# Patient Record
Sex: Female | Born: 1960
Health system: Southern US, Community
[De-identification: ages and names within clinical notes are randomized; demographics above are authoritative.]

## PROBLEM LIST (undated history)

## (undated) DIAGNOSIS — M539 Dorsopathy, unspecified: Secondary | ICD-10-CM

## (undated) DIAGNOSIS — R519 Headache, unspecified: Secondary | ICD-10-CM

## (undated) DIAGNOSIS — E559 Vitamin D deficiency, unspecified: Secondary | ICD-10-CM

## (undated) DIAGNOSIS — G8929 Other chronic pain: Secondary | ICD-10-CM

## (undated) DIAGNOSIS — M545 Low back pain, unspecified: Secondary | ICD-10-CM

## (undated) DIAGNOSIS — R51 Headache: Secondary | ICD-10-CM

## (undated) DIAGNOSIS — D509 Iron deficiency anemia, unspecified: Secondary | ICD-10-CM

## (undated) DIAGNOSIS — IMO0001 Reserved for inherently not codable concepts without codable children: Secondary | ICD-10-CM

## (undated) DIAGNOSIS — R42 Dizziness and giddiness: Secondary | ICD-10-CM

## (undated) DIAGNOSIS — E1142 Type 2 diabetes mellitus with diabetic polyneuropathy: Secondary | ICD-10-CM

## (undated) DIAGNOSIS — K219 Gastro-esophageal reflux disease without esophagitis: Secondary | ICD-10-CM

## (undated) DIAGNOSIS — E039 Hypothyroidism, unspecified: Secondary | ICD-10-CM

## (undated) DIAGNOSIS — F419 Anxiety disorder, unspecified: Secondary | ICD-10-CM

## (undated) DIAGNOSIS — G4730 Sleep apnea, unspecified: Secondary | ICD-10-CM

## (undated) DIAGNOSIS — Z972 Presence of dental prosthetic device (complete) (partial): Secondary | ICD-10-CM

## (undated) DIAGNOSIS — E119 Type 2 diabetes mellitus without complications: Secondary | ICD-10-CM

## (undated) DIAGNOSIS — F329 Major depressive disorder, single episode, unspecified: Secondary | ICD-10-CM

## (undated) DIAGNOSIS — G43019 Migraine without aura, intractable, without status migrainosus: Secondary | ICD-10-CM

## (undated) DIAGNOSIS — I10 Essential (primary) hypertension: Secondary | ICD-10-CM

## (undated) DIAGNOSIS — E785 Hyperlipidemia, unspecified: Secondary | ICD-10-CM

## (undated) DIAGNOSIS — I209 Angina pectoris, unspecified: Secondary | ICD-10-CM

## (undated) DIAGNOSIS — F32A Depression, unspecified: Secondary | ICD-10-CM

## (undated) DIAGNOSIS — Z6841 Body Mass Index (BMI) 40.0 and over, adult: Secondary | ICD-10-CM

## (undated) DIAGNOSIS — T753XXA Motion sickness, initial encounter: Secondary | ICD-10-CM

## (undated) DIAGNOSIS — D125 Benign neoplasm of sigmoid colon: Secondary | ICD-10-CM

## (undated) DIAGNOSIS — K5909 Other constipation: Secondary | ICD-10-CM

## (undated) DIAGNOSIS — E042 Nontoxic multinodular goiter: Secondary | ICD-10-CM

## (undated) HISTORY — PX: CHOLECYSTECTOMY: SHX55

## (undated) HISTORY — DX: Essential (primary) hypertension: I10

## (undated) HISTORY — DX: Major depressive disorder, single episode, unspecified: F32.9

## (undated) HISTORY — DX: Hyperlipidemia, unspecified: E78.5

## (undated) HISTORY — PX: ECTOPIC PREGNANCY SURGERY: SHX613

## (undated) HISTORY — DX: Anxiety disorder, unspecified: F41.9

## (undated) HISTORY — DX: Type 2 diabetes mellitus without complications: E11.9

## (undated) HISTORY — PX: ABDOMINAL HYSTERECTOMY: SHX81

## (undated) HISTORY — DX: Depression, unspecified: F32.A

## (undated) HISTORY — DX: Migraine without aura, intractable, without status migrainosus: G43.019

---

## 1993-07-29 HISTORY — PX: OTHER SURGICAL HISTORY: SHX169

## 1994-07-29 HISTORY — PX: ABDOMINAL HYSTERECTOMY: SHX81

## 1995-07-30 HISTORY — PX: BLADDER SUSPENSION: SHX72

## 2004-04-26 ENCOUNTER — Observation Stay (HOSPITAL_COMMUNITY): Admission: RE | Admit: 2004-04-26 | Discharge: 2004-04-27 | Payer: Self-pay | Admitting: Gynecology

## 2004-07-29 ENCOUNTER — Emergency Department: Payer: Self-pay | Admitting: Emergency Medicine

## 2004-08-06 ENCOUNTER — Ambulatory Visit: Payer: Self-pay

## 2004-12-03 ENCOUNTER — Ambulatory Visit: Payer: Self-pay

## 2005-01-07 ENCOUNTER — Ambulatory Visit: Payer: Self-pay

## 2005-03-13 ENCOUNTER — Ambulatory Visit: Payer: Self-pay | Admitting: Family Medicine

## 2005-04-11 ENCOUNTER — Other Ambulatory Visit: Payer: Self-pay

## 2005-04-11 ENCOUNTER — Emergency Department: Payer: Self-pay | Admitting: Emergency Medicine

## 2005-05-10 ENCOUNTER — Emergency Department (HOSPITAL_COMMUNITY): Admission: EM | Admit: 2005-05-10 | Discharge: 2005-05-11 | Payer: Self-pay | Admitting: Emergency Medicine

## 2005-11-28 ENCOUNTER — Emergency Department: Payer: Self-pay | Admitting: Emergency Medicine

## 2005-11-30 ENCOUNTER — Emergency Department: Payer: Self-pay | Admitting: Emergency Medicine

## 2005-12-02 ENCOUNTER — Emergency Department: Payer: Self-pay | Admitting: Internal Medicine

## 2006-01-09 ENCOUNTER — Ambulatory Visit: Payer: Self-pay | Admitting: Family Medicine

## 2007-08-02 ENCOUNTER — Other Ambulatory Visit: Payer: Self-pay

## 2007-08-02 ENCOUNTER — Emergency Department: Payer: Self-pay | Admitting: Emergency Medicine

## 2007-08-07 ENCOUNTER — Emergency Department: Payer: Self-pay | Admitting: Internal Medicine

## 2007-08-13 DIAGNOSIS — F3341 Major depressive disorder, recurrent, in partial remission: Secondary | ICD-10-CM | POA: Insufficient documentation

## 2007-12-24 ENCOUNTER — Emergency Department: Payer: Self-pay | Admitting: Emergency Medicine

## 2008-09-01 DIAGNOSIS — J4520 Mild intermittent asthma, uncomplicated: Secondary | ICD-10-CM | POA: Insufficient documentation

## 2008-10-13 DIAGNOSIS — J309 Allergic rhinitis, unspecified: Secondary | ICD-10-CM | POA: Insufficient documentation

## 2009-11-21 DIAGNOSIS — K5900 Constipation, unspecified: Secondary | ICD-10-CM | POA: Insufficient documentation

## 2009-11-24 ENCOUNTER — Emergency Department: Payer: Self-pay | Admitting: Emergency Medicine

## 2010-11-16 ENCOUNTER — Encounter: Payer: Self-pay | Admitting: Cardiovascular Disease

## 2010-11-16 ENCOUNTER — Ambulatory Visit (INDEPENDENT_AMBULATORY_CARE_PROVIDER_SITE_OTHER): Payer: Self-pay | Admitting: Cardiovascular Disease

## 2010-11-16 ENCOUNTER — Encounter: Payer: Self-pay | Admitting: *Deleted

## 2010-11-16 DIAGNOSIS — R079 Chest pain, unspecified: Secondary | ICD-10-CM

## 2010-11-16 MED ORDER — NITROGLYCERIN 0.4 MG SL SUBL: 0.4000 mg | SUBLINGUAL_TABLET | SUBLINGUAL | Status: DC | PRN

## 2010-11-16 NOTE — Patient Instructions (Addendum)
Call 911 if the chest pain worsens. Your physician recommends that you schedule a follow-up appointment with Dr. Mariah Milling in 2 weeks. You have been scheduled for a stress test Myoview at [facility] in [place]. You were given instructions for this procedure today.

## 2010-11-16 NOTE — Progress Notes (Signed)
Beverly Barrett Date of Birth  05-01-1961 [facility] / [facility] [address]     [address] [place], [place]  [address] [phone]  Fax  [phone]  History of Present Illness: several weeks of cp.  Associated with dyspnea. Worse with ambulation. Better with rest. No dizziness. No syncope.   Chest pressure / heaviness.  Not similar to her GERD pain.  She had run out of her medications recently.  She restarted them yesterday.     She was seen by her medical doctor yesterday and was referred here today.  She had another episode of CP this am and called 911.  Current Outpatient Prescriptions  Medication Sig Dispense Refill  . aspirin 81 MG EC tablet Take 81 mg by mouth daily.        . carvedilol (COREG) 3.125 MG tablet Take 3.125 mg by mouth 2 (two) times daily with a meal.        . lisinopril-hydrochlorothiazide (PRINZIDE,ZESTORETIC) 20-12.5 MG per tablet Take 1 tablet by mouth 2 (two) times daily.        She has been out of her medications until yesterday when she restarted all of the above meds.  No Known Allergies  Past Medical History  Diagnosis Date  . Hyperlipidemia   . Hypertension   . Asthma   . Depression     Past Surgical History  Procedure Date  . Abdominal hysterectomy   . Cesarean section     History  Smoking status  . Never Smoker   Smokeless tobacco  . Never Used    History  Alcohol Use No    Family History  Problem Relation Age of Onset  . Diabetes Sister   . Hypertension Sister   . Cirrhosis Mother   . Cirrhosis Father   . Breast cancer Sister   . Heart attack Brother     Reviw of Systems:  Reviewed in the HPI.  All other systems are negative.  Physical Exam: BP 128/68  Pulse 63  Ht 5\' 2"  (1.575 m)  Wt 232 lb (105.235 kg)  BMI 42.43 kg/m2 The patient is alert and oriented x 3.  The mood and affect are normal.  The skin is warm and dry.  Color is normal.  The HEENT exam reveals that the sclera are  nonicteric.  The mucous membranes are moist.  The carotids are 2+ without bruits.  There is no thyromegaly.  There is no JVD.  The lungs are clear.  The chest wall is non tender.  The heart exam reveals a regular rate with a normal S1 and S2.  There are no murmurs, gallops, or rubs.  The PMI is not displaced.   Abdominal exam reveals good bowel sounds.  There is no guarding or rebound.  There is no hepatosplenomegaly or tenderness.  There are no masses.  Exam of the legs reveal no clubbing, cyanosis, or edema.  The legs are without rashes.  The distal pulses are intact.  Cranial nerves II - XII are intact.  Motor and sensory functions are intact.  The gait is normal.  ECG: NSR.  + U waves but no acute ST or T wave changes. Assessment / Plan:

## 2010-11-16 NOTE — Assessment & Plan Note (Signed)
Beverly Barrett presents with cp that is consistent with angina.  It's possible that these pains are due to hypertension while she was off her medications.  The pain is centered in her chest and is a pressure like sensation.  It radiates out to her arm.  Her ECG is nonacute.  We will schedule her for a 2 day  stress / Lexiscan Myoview at the hospital.  I have prescribed NTG for her.  We will see her back in the clinic in several weeks.

## 2010-11-16 NOTE — Progress Notes (Signed)
Addended by: Lanny Hurst on: 11/16/2010 03:12 PM   Modules accepted: Orders

## 2010-11-28 ENCOUNTER — Ambulatory Visit (HOSPITAL_COMMUNITY): Payer: Medicaid Other | Attending: Cardiovascular Disease | Admitting: Radiology

## 2010-11-28 DIAGNOSIS — R0989 Other specified symptoms and signs involving the circulatory and respiratory systems: Secondary | ICD-10-CM

## 2010-11-28 DIAGNOSIS — R0609 Other forms of dyspnea: Secondary | ICD-10-CM

## 2010-11-28 DIAGNOSIS — R079 Chest pain, unspecified: Secondary | ICD-10-CM | POA: Insufficient documentation

## 2010-11-28 MED ORDER — TECHNETIUM TC 99M TETROFOSMIN IV KIT
33.0000 | PACK | Freq: Once | INTRAVENOUS | Status: AC | PRN
  Administered 2010-11-28: 33 via INTRAVENOUS

## 2010-11-28 NOTE — Progress Notes (Addendum)
[facility] SITE 3 NUCLEAR MED [address] [place] [address] [phone]  Cardiology Nuclear Med Study  Beverly Barrett is a 50 y.o. female [id number] 1961-02-11   Nuclear Med Background Indication for Stress Test:  Evaluation for Ischemia History:  ~10 yrs. ago GXT:OK per patient. Cardiac Risk Factors: Hypertension, Lipids and Obesity  Symptoms:  Chest Pain with Exertion (last episode of chest discomfort was last week), Diaphoresis, DOE, Fatigue, Palpitations, Rapid HR and SOB   Nuclear Pre-Procedure Caffeine/Decaff Intake:  None NPO After: 8:00pm   Lungs:  Clear.  O2 sat 100%.  Patient used Ventolin inhaler prior to exercise, due to asthmatic cough.  IV 0.9% NS with Angio Cath:  20g  IV Site: L Antecubital  IV Started by:  Stanton Kidney, EMT-P  Chest Size (in):  38 Cup Size: D  Height: 5\' 2"  (1.575 m)  Weight:  228 lb (103.42 kg)  BMI:  Body mass index is 41.70 kg/(m^2). Tech Comments:  Carvedilol held > 24 hours, per patient.    Nuclear Med Study 1 or 2 day study: 2 day  Stress Test Type:  Stress  Reading MD: Marca Ancona, MD  Order Authorizing Provider:  Kristeen Miss, MD  Resting Radionuclide: Technetium 75m Tetrofosmin  Resting Radionuclide Dose: 33 mCi   Stress Radionuclide:  Technetium 46m Tetrofosmin  Stress Radionuclide Dose: 33 mCi           Stress Protocol Rest HR: 92 Stress HR: 162  Rest BP: 120/79 Stress BP: 173/78  Exercise Time (min): 5:00 METS: 7.0   Predicted Max HR: 169 bpm % Max HR: 95.86 bpm Rate Pressure Product: 21308   Dose of Adenosine (mg):  n/a Dose of Lexiscan: n/a mg  Dose of Atropine (mg): n/a Dose of Dobutamine: n/a mcg/kg/min (at max HR)  Stress Test Technologist: Duke Salvia, CMA-N  Nuclear Technologist:  Domenic Polite, CNMT     Rest Procedure:  Myocardial perfusion imaging was performed at rest 45 minutes following the intravenous administration of Technetium 49m Tetrofosmin. Rest ECG: No acute  changes, isolated PVC.  Stress Procedure:  The patient exercised for five minutes on the treadmill utilizing the Bruce protocol.  The patient stopped due to fatigue and denied any chest pain.  There were no diagnostic ST-T wave changes.  Technetium 65m Tetrofosmin was injected at peak exercise and myocardial perfusion imaging was performed after a brief delay. Stress ECG: Insignificant upsloping ST segment depression.  QPS Raw Data Images:  Normal; no motion artifact; normal heart/lung ratio. Stress Images:  Normal homogeneous uptake in all areas of the myocardium. Rest Images:  Normal homogeneous uptake in all areas of the myocardium. Subtraction (SDS):  There is no evidence of scar or ischemia. Transient Ischemic Dilatation (Normal <1.22):  1.02 Lung/Heart Ratio (Normal <0.45):  0.32  Quantitative Gated Spect Images QGS EDV:  74 ml QGS ESV:  22 ml QGS cine images:  Normal Wall Motion QGS EF: 70%  Impression Exercise Capacity:  Below average exercise tolerance.  BP Response:  Normal blood pressure response. Clinical Symptoms:  No chest pain. ECG Impression:  Insignificant upsloping ST segment depression. Comparison with Prior Nuclear Study: No previous nuclear study performed  Overall Impression:  Normal stress nuclear study.  Marca Ancona   Please report normal stress nuclear study. Elyn Aquas.

## 2010-11-30 ENCOUNTER — Ambulatory Visit: Payer: Self-pay | Admitting: Cardiovascular Disease

## 2010-12-04 ENCOUNTER — Ambulatory Visit: Payer: Self-pay | Admitting: Family Medicine

## 2010-12-05 ENCOUNTER — Ambulatory Visit (HOSPITAL_COMMUNITY): Payer: Self-pay | Attending: Cardiovascular Disease | Admitting: Radiology

## 2010-12-05 DIAGNOSIS — R0989 Other specified symptoms and signs involving the circulatory and respiratory systems: Secondary | ICD-10-CM

## 2010-12-05 MED ORDER — TECHNETIUM TC 99M TETROFOSMIN IV KIT
33.0000 | PACK | Freq: Once | INTRAVENOUS | Status: AC | PRN
  Administered 2010-12-05: 33 via INTRAVENOUS

## 2010-12-06 NOTE — Progress Notes (Signed)
Copy routed to Dr. Nahser.Falecha Clark ° °

## 2010-12-07 ENCOUNTER — Ambulatory Visit (INDEPENDENT_AMBULATORY_CARE_PROVIDER_SITE_OTHER): Payer: Self-pay | Admitting: Cardiovascular Disease

## 2010-12-07 ENCOUNTER — Telehealth: Payer: Self-pay | Admitting: *Deleted

## 2010-12-07 ENCOUNTER — Encounter: Payer: Self-pay | Admitting: Cardiovascular Disease

## 2010-12-07 DIAGNOSIS — I10 Essential (primary) hypertension: Secondary | ICD-10-CM | POA: Insufficient documentation

## 2010-12-07 DIAGNOSIS — E7849 Other hyperlipidemia: Secondary | ICD-10-CM | POA: Insufficient documentation

## 2010-12-07 DIAGNOSIS — E785 Hyperlipidemia, unspecified: Secondary | ICD-10-CM

## 2010-12-07 DIAGNOSIS — E669 Obesity, unspecified: Secondary | ICD-10-CM

## 2010-12-07 DIAGNOSIS — R079 Chest pain, unspecified: Secondary | ICD-10-CM

## 2010-12-07 NOTE — Patient Instructions (Signed)
You are doing well. No medication changes were made. Please call us if you have new issues, otherwise follow up as needed.

## 2010-12-07 NOTE — Progress Notes (Signed)
   Patient ID: Beverly Barrett, female    DOB: 05/20/1961, 50 y.o.   MRN: [id number]  HPI Comments: Beverly Barrett is a very pleasant woman, patient of Dr. Carlynn Purl, recently seen in our office for chest discomfort who had a stress test & for routine followup.  She had an exercise treadmill with today perfusion study that showed no ischemia. Adequate exercise tolerance with no significant EKG changes concerning for ischemia. Overall she feels well. She has not had significant chest discomfort recently. She wonders if it could be secondary to stress. She has run out of her cholesterol medication and needs to pick up her refill. She's been trying to watch her weight and is happy that she has recently lost 3 pounds.  EKG shows normal sinus rhythm with rate 85 beats per minute, no significant ST or T wave changes      Review of Systems  Constitutional: Negative.   HENT: Negative.   Eyes: Negative.   Respiratory: Negative.   Cardiovascular: Positive for chest pain.  Gastrointestinal: Negative.   Musculoskeletal: Negative.   Skin: Negative.   Neurological: Negative.   Hematological: Negative.   Psychiatric/Behavioral: Negative.   All other systems reviewed and are negative.    BP 120/88  Pulse 85  Ht 5\' 2"  (1.575 m)  Wt 230 lb (104.327 kg)  BMI 42.07 kg/m2   Physical Exam  Nursing note and vitals reviewed. Constitutional: She is oriented to person, place, and time. She appears well-developed and well-nourished.       obese  HENT:  Head: Normocephalic.  Nose: Nose normal.  Mouth/Throat: Oropharynx is clear and moist.  Eyes: Conjunctivae are normal. Pupils are equal, round, and reactive to light.  Neck: Normal range of motion. Neck supple. No JVD present.  Cardiovascular: Normal rate, regular rhythm, S1 normal, S2 normal, normal heart sounds and intact distal pulses.  Exam reveals no gallop and no friction rub.   No murmur heard. Pulmonary/Chest: Effort normal and breath sounds  normal. No respiratory distress. She has no wheezes. She has no rales. She exhibits no tenderness.  Abdominal: Soft. Bowel sounds are normal. She exhibits no distension. There is no tenderness.  Musculoskeletal: Normal range of motion. She exhibits no edema and no tenderness.  Lymphadenopathy:    She has no cervical adenopathy.  Neurological: She is alert and oriented to person, place, and time. Coordination normal.  Skin: Skin is warm and dry. No rash noted. No erythema.  Psychiatric: She has a normal mood and affect. Her behavior is normal. Judgment and thought content normal.         Assessment and Plan

## 2010-12-07 NOTE — Assessment & Plan Note (Signed)
We have encouraged continued exercise, careful diet management in an effort to lose weight. 

## 2010-12-07 NOTE — Assessment & Plan Note (Signed)
We have suggested that she pick up her refill for her cholesterol medication.

## 2010-12-07 NOTE — Telephone Encounter (Signed)
Patient called with echo results. Pt verbalized understanding. Jodette Brylee Mcgreal RN  

## 2010-12-07 NOTE — Assessment & Plan Note (Signed)
Recent episodes of chest pain are likely atypical in nature. She had a normal exercise perfusion stress test. We have suggested that if she continues to have chest pain, she could try over-the-counter proton pump inhibitors. It is certainly possible that her chest pain is secondary to stress. We have suggested stress reduction techniques, increasing her exercise.

## 2010-12-07 NOTE — Assessment & Plan Note (Signed)
,  Blood pressure is well controlled on today's visit. No changes made to the medications.

## 2011-03-27 ENCOUNTER — Emergency Department: Payer: Self-pay | Admitting: *Deleted

## 2011-08-06 ENCOUNTER — Ambulatory Visit: Payer: Self-pay

## 2013-07-14 ENCOUNTER — Ambulatory Visit: Payer: Self-pay

## 2013-08-05 ENCOUNTER — Ambulatory Visit: Payer: Self-pay

## 2013-08-18 ENCOUNTER — Ambulatory Visit: Payer: Self-pay | Admitting: Family Medicine

## 2013-08-18 DIAGNOSIS — E042 Nontoxic multinodular goiter: Secondary | ICD-10-CM | POA: Insufficient documentation

## 2013-08-18 DIAGNOSIS — E039 Hypothyroidism, unspecified: Secondary | ICD-10-CM | POA: Insufficient documentation

## 2013-08-18 DIAGNOSIS — E034 Atrophy of thyroid (acquired): Secondary | ICD-10-CM | POA: Insufficient documentation

## 2013-12-23 ENCOUNTER — Ambulatory Visit: Payer: Self-pay | Admitting: Family Medicine

## 2014-04-22 ENCOUNTER — Ambulatory Visit: Payer: Self-pay | Admitting: Otolaryngology

## 2014-05-26 ENCOUNTER — Ambulatory Visit: Payer: Self-pay | Admitting: Otolaryngology

## 2014-06-02 ENCOUNTER — Ambulatory Visit: Payer: Self-pay | Admitting: Otolaryngology

## 2014-06-14 DIAGNOSIS — E8881 Metabolic syndrome: Secondary | ICD-10-CM | POA: Insufficient documentation

## 2014-06-14 DIAGNOSIS — Z6841 Body Mass Index (BMI) 40.0 and over, adult: Secondary | ICD-10-CM

## 2014-06-14 DIAGNOSIS — E1159 Type 2 diabetes mellitus with other circulatory complications: Secondary | ICD-10-CM | POA: Insufficient documentation

## 2014-06-14 DIAGNOSIS — E668 Other obesity: Secondary | ICD-10-CM | POA: Insufficient documentation

## 2014-06-14 DIAGNOSIS — M1990 Unspecified osteoarthritis, unspecified site: Secondary | ICD-10-CM | POA: Insufficient documentation

## 2014-06-14 DIAGNOSIS — G8929 Other chronic pain: Secondary | ICD-10-CM | POA: Insufficient documentation

## 2014-06-14 DIAGNOSIS — E669 Obesity, unspecified: Secondary | ICD-10-CM | POA: Insufficient documentation

## 2014-06-14 DIAGNOSIS — I10 Essential (primary) hypertension: Secondary | ICD-10-CM

## 2014-06-14 DIAGNOSIS — E1169 Type 2 diabetes mellitus with other specified complication: Secondary | ICD-10-CM

## 2014-06-14 DIAGNOSIS — K219 Gastro-esophageal reflux disease without esophagitis: Secondary | ICD-10-CM | POA: Insufficient documentation

## 2014-06-14 DIAGNOSIS — I152 Hypertension secondary to endocrine disorders: Secondary | ICD-10-CM | POA: Insufficient documentation

## 2014-06-14 DIAGNOSIS — M2550 Pain in unspecified joint: Secondary | ICD-10-CM

## 2015-01-07 ENCOUNTER — Other Ambulatory Visit: Payer: Self-pay | Admitting: Family Medicine

## 2015-01-07 DIAGNOSIS — R0982 Postnasal drip: Principal | ICD-10-CM

## 2015-01-07 DIAGNOSIS — J309 Allergic rhinitis, unspecified: Secondary | ICD-10-CM

## 2015-02-09 ENCOUNTER — Other Ambulatory Visit: Payer: Self-pay | Admitting: Family Medicine

## 2015-02-09 DIAGNOSIS — E785 Hyperlipidemia, unspecified: Secondary | ICD-10-CM

## 2015-02-09 DIAGNOSIS — I10 Essential (primary) hypertension: Secondary | ICD-10-CM

## 2015-02-09 DIAGNOSIS — G8929 Other chronic pain: Secondary | ICD-10-CM

## 2015-02-09 DIAGNOSIS — M2550 Pain in unspecified joint: Secondary | ICD-10-CM

## 2015-03-15 ENCOUNTER — Other Ambulatory Visit: Payer: Self-pay | Admitting: Family Medicine

## 2015-03-15 DIAGNOSIS — M545 Low back pain, unspecified: Secondary | ICD-10-CM

## 2015-03-15 MED ORDER — TRAMADOL HCL 50 MG PO TABS: 50.0000 mg | ORAL_TABLET | Freq: Two times a day (BID) | ORAL | Status: DC | PRN

## 2015-04-14 ENCOUNTER — Ambulatory Visit (INDEPENDENT_AMBULATORY_CARE_PROVIDER_SITE_OTHER): Payer: BLUE CROSS/BLUE SHIELD | Admitting: Family Medicine

## 2015-04-14 ENCOUNTER — Encounter: Payer: Self-pay | Admitting: Family Medicine

## 2015-04-14 VITALS — BP 142/90 | HR 94 | Temp 98.9°F | Resp 16 | Wt 254.4 lb

## 2015-04-14 DIAGNOSIS — F332 Major depressive disorder, recurrent severe without psychotic features: Secondary | ICD-10-CM | POA: Diagnosis not present

## 2015-04-14 DIAGNOSIS — M2550 Pain in unspecified joint: Secondary | ICD-10-CM | POA: Diagnosis not present

## 2015-04-14 DIAGNOSIS — I10 Essential (primary) hypertension: Secondary | ICD-10-CM

## 2015-04-14 DIAGNOSIS — Z113 Encounter for screening for infections with a predominantly sexual mode of transmission: Secondary | ICD-10-CM | POA: Diagnosis not present

## 2015-04-14 DIAGNOSIS — R350 Frequency of micturition: Secondary | ICD-10-CM | POA: Diagnosis not present

## 2015-04-14 DIAGNOSIS — M545 Low back pain, unspecified: Secondary | ICD-10-CM

## 2015-04-14 DIAGNOSIS — J4520 Mild intermittent asthma, uncomplicated: Secondary | ICD-10-CM

## 2015-04-14 DIAGNOSIS — Z1231 Encounter for screening mammogram for malignant neoplasm of breast: Secondary | ICD-10-CM

## 2015-04-14 DIAGNOSIS — Z0000 Encounter for general adult medical examination without abnormal findings: Secondary | ICD-10-CM

## 2015-04-14 DIAGNOSIS — E038 Other specified hypothyroidism: Secondary | ICD-10-CM

## 2015-04-14 DIAGNOSIS — E034 Atrophy of thyroid (acquired): Secondary | ICD-10-CM

## 2015-04-14 DIAGNOSIS — G8929 Other chronic pain: Secondary | ICD-10-CM | POA: Diagnosis not present

## 2015-04-14 DIAGNOSIS — Z1211 Encounter for screening for malignant neoplasm of colon: Secondary | ICD-10-CM | POA: Insufficient documentation

## 2015-04-14 DIAGNOSIS — M549 Dorsalgia, unspecified: Secondary | ICD-10-CM | POA: Insufficient documentation

## 2015-04-14 DIAGNOSIS — IMO0001 Reserved for inherently not codable concepts without codable children: Secondary | ICD-10-CM | POA: Insufficient documentation

## 2015-04-14 LAB — POCT URINALYSIS DIPSTICK
Bilirubin, UA: NEGATIVE
Glucose, UA: NEGATIVE
Ketones, UA: NEGATIVE
Leukocytes, UA: NEGATIVE
Nitrite, UA: NEGATIVE
Spec Grav, UA: 1.025
Urobilinogen, UA: 0.2
pH, UA: 6

## 2015-04-14 MED ORDER — FLUOXETINE HCL 20 MG PO TABS: 20.0000 mg | ORAL_TABLET | Freq: Every day | ORAL | Status: DC

## 2015-04-14 MED ORDER — MONTELUKAST SODIUM 10 MG PO TABS: 10.0000 mg | ORAL_TABLET | Freq: Every day | ORAL | Status: DC

## 2015-04-14 MED ORDER — TRAMADOL HCL 50 MG PO TABS
50.0000 mg | ORAL_TABLET | Freq: Two times a day (BID) | ORAL | Status: DC | PRN
Start: 2015-04-14 — End: 2015-06-16

## 2015-04-14 NOTE — Progress Notes (Signed)
Name: Beverly Barrett   MRN: [id number]    DOB: 1961-04-13   Date:04/14/2015       Progress Note  Subjective  Chief Complaint  Chief Complaint  Patient presents with  . Annual Exam  . Medication Refill    all meds  . Urinary Tract Infection    patient stated that she has been waking up with nausea. she has some frequency and incomplete emptying of her bladder.  . Constipation    HPI  Patient is here today for a Complete Female Physical Exam but also has new symptoms and needs refills of ongoing medical conditions.  The patient complains of nausea without vomiting for several days, backache which is chronic and stable and frequency of urination and re occurance of depression. Overall feels health needs and waxing and waning. Diet is well balanced but consists of high fat, high carb, large portion meals. In general does not exercise regularly. Sees dentist regularly and addresses vision concerns with ophthalmologist if applicable. In regards to sexual activity the patient is not currently sexually active. Currently is not concerned about exposure to any STDs. Menstrual history is positive for hysterectomy, PAP testing done negative in 2015.    Beverly Barrett has a past history of depression but has not needed medication therapy in many years. She will not speak about stressors in life.. She complains of depressed mood, fatigue, feelings of worthlessness/guilt, hopelessness and insomnia. Onset was approximately a few months ago, gradually worsening since that time.  She denies current suicidal and homicidal plan or intent but has felt that she may be better off dead in the past month.   Family history significant for anxiety and depression. Possible organic causes contributing are: endocrine/metabolic.  Risk factors: previous episode of depression Previous treatment includes she is not sure if she has been on Effexor in the past and individual therapy.  In addition she reports symptoms of vague  nausea without headaches, GI upset, change in bowel movements, focal neurological changes, chest pain. She is having urinary frequency without pain or discomfort or pelvic pressure. Chronic constipation issues are stable, not worse, not better. Has had a C-scope many years ago and is willing to return for repeat as she did have polyps on her last scope. She has a diagnosis of HTN and is taking her medication Losartan-HCTZ 100-25mg  as instructed with no side effects, worsening swelling of LE, chest pain, SOB. Asthma is well controled on prn albuterol inhaler use and montelukast and controlling her allergies. Chronic joint pain involving lower back, hips, right greater than left knees stable on tramadol 50 mg twice a day. Needs several refills.   Active Ambulatory Problems    Diagnosis Date Noted  . Chest pain 11/16/2010  . Hyperlipidemia 12/07/2010  . Obesity 12/07/2010  . Allergic rhinitis 10/13/2008  . Asthma, mild intermittent, well-controlled 09/01/2008  . Chronic pain of multiple joints 06/14/2014  . CN (constipation) 11/21/2009  . Clinical depression 08/13/2007  . Dysmetabolic syndrome 62/95/2841  . Acid reflux 06/14/2014  . Hypertension goal BP (blood pressure) < 140/90 12/07/2010  . Familial multiple lipoprotein-type hyperlipidemia 12/07/2010  . Adult hypothyroidism 08/18/2013  . Extreme obesity 06/14/2014  . Arthritis, degenerative 06/14/2014  . Frequency 04/14/2015  . Annual physical exam 04/14/2015  . Encounter for screening for malignant neoplasm of colon 04/14/2015  . Encounter for screening mammogram for malignant neoplasm of breast 04/14/2015  . Encounter for cholesteral screening for cardiovascular disease 04/14/2015  . Screening for STD (sexually transmitted disease) 04/14/2015  .  Midline low back pain without sciatica 04/14/2015   Resolved Ambulatory Problems    Diagnosis Date Noted  . HTN (hypertension) 12/07/2010   Past Medical History  Diagnosis Date  .  Hypertension   . Asthma   . Depression     Past Surgical History  Procedure Laterality Date  . Abdominal hysterectomy    . Cesarean section      Family History  Problem Relation Age of Onset  . Diabetes Sister   . Hypertension Sister   . Cirrhosis Mother   . Cirrhosis Father   . Breast cancer Sister   . Heart attack Brother     Social History   Social History  . Marital Status: Married    Spouse Name: N/A  . Number of Children: N/A  . Years of Education: N/A   Occupational History  . Not on file.   Social History Main Topics  . Smoking status: Never Smoker   . Smokeless tobacco: Never Used  . Alcohol Use: No  . Drug Use: No  . Sexual Activity:    Partners: Male   Other Topics Concern  . Not on file   Social History Narrative     Current outpatient prescriptions:  .  aspirin 81 MG EC tablet, Take 81 mg by mouth daily.  , Disp: , Rfl:  .  Aspirin-Acetaminophen (GOODYS BODY PAIN PO), Take by mouth., Disp: , Rfl:  .  atorvastatin (LIPITOR) 20 MG tablet, TAKE ONE TABLET BY MOUTH AT BEDTIME, Disp: 90 tablet, Rfl: 2 .  Azelastine HCl 0.15 % SOLN, , Disp: , Rfl: 3 .  carvedilol (COREG) 3.125 MG tablet, Take 3.125 mg by mouth 2 (two) times daily with a meal.  , Disp: , Rfl:  .  diclofenac (VOLTAREN) 75 MG EC tablet, TAKE ONE TABLET BY MOUTH EVERY 12 HOURS AS NEEDED, Disp: 60 tablet, Rfl: 3 .  docusate sodium (COLACE) 100 MG capsule, Take 100 mg by mouth 2 (two) times daily., Disp: , Rfl:  .  fluticasone (FLONASE) 50 MCG/ACT nasal spray, by Nasal route., Disp: , Rfl:  .  levothyroxine (SYNTHROID, LEVOTHROID) 100 MCG tablet, , Disp: , Rfl: 1 .  Loratadine 10 MG CAPS, Take by mouth., Disp: , Rfl:  .  losartan-hydrochlorothiazide (HYZAAR) 100-25 MG per tablet, TAKE ONE TABLET BY MOUTH ONCE DAILY, Disp: 30 tablet, Rfl: 3 .  montelukast (SINGULAIR) 10 MG tablet, Take 1 tablet (10 mg total) by mouth at bedtime., Disp: 90 tablet, Rfl: 3 .  omeprazole (PRILOSEC) 40 MG  capsule, , Disp: , Rfl: 10 .  traMADol (ULTRAM) 50 MG tablet, Take 1 tablet (50 mg total) by mouth every 12 (twelve) hours as needed., Disp: 120 tablet, Rfl: 0 .  FLUoxetine (PROZAC) 20 MG tablet, Take 1 tablet (20 mg total) by mouth daily., Disp: 30 tablet, Rfl: 3 .  nitroGLYCERIN (NITROSTAT) 0.4 MG SL tablet, Place 1 tablet (0.4 mg total) under the tongue every 5 (five) minutes as needed for chest pain., Disp: 25 tablet, Rfl: 12  No Known Allergies  ROS  CONSTITUTIONAL: No significant weight changes, fever, chills, weakness. Yes fatigue.  HEENT:  - Eyes: No visual changes.  - Ears: No auditory changes. No pain.  - Nose: No sneezing, congestion, runny nose. - Throat: No sore throat. No changes in swallowing. SKIN: No rash or itching.  CARDIOVASCULAR: No chest pain, chest pressure or chest discomfort. No palpitations or edema.  RESPIRATORY: No shortness of breath, cough or sputum.  GASTROINTESTINAL: Yes nausea.  No anorexia, vomiting. No changes in bowel habits. No abdominal pain or blood.  GENITOURINARY: No dysuria. Yes frequency. No discharge.  NEUROLOGICAL: No headache, dizziness, syncope, paralysis, ataxia, numbness or tingling in the extremities. No memory changes. No change in bowel or bladder control.  MUSCULOSKELETAL: Chronic joint pain. No muscle pain. HEMATOLOGIC: No anemia, bleeding or bruising.  LYMPHATICS: No enlarged lymph nodes.  PSYCHIATRIC: Yes change in mood. Yes change in sleep pattern.  ENDOCRINOLOGIC: No reports of sweating, cold or heat intolerance. No polyuria or polydipsia.   Depression screen PHQ 2/9 04/14/2015  Decreased Interest 1  Down, Depressed, Hopeless 3  PHQ - 2 Score 4  Altered sleeping 3  Tired, decreased energy 2  Change in appetite 2  Feeling bad or failure about yourself  2  Trouble concentrating 2  Moving slowly or fidgety/restless 1  Suicidal thoughts 1  PHQ-9 Score 17  Difficult doing work/chores Very difficult     Objective  Filed  Vitals:   04/14/15 0824  BP: 142/90  Pulse: 94  Temp: 98.9 F (37.2 C)  TempSrc: Oral  Resp: 16  Weight: 254 lb 6.4 oz (115.395 kg)  SpO2: 95%   Body mass index is 46.52 kg/(m^2).   Recent Results (from the past 2160 hour(s))  POCT urinalysis dipstick     Status: Abnormal   Collection Time: 04/14/15  8:48 AM  Result Value Ref Range   Color, UA YELLOW    Clarity, UA DARK    Glucose, UA NEGATIVE    Bilirubin, UA NEGATIVE    Ketones, UA NEGATIVE    Spec Grav, UA 1.025    Blood, UA SMALL    pH, UA 6.0    Protein, UA TRACE    Urobilinogen, UA 0.2    Nitrite, UA NEGATIVE    Leukocytes, UA Negative Negative    Physical Exam  Constitutional: Patient is obese and well-nourished. In no distress.  HEENT:  - Head: Normocephalic and atraumatic.  - Ears: Bilateral TMs gray, no erythema or effusion - Nose: Nasal mucosa moist - Mouth/Throat: Oropharynx is clear and moist. No tonsillar hypertrophy or erythema. No post nasal drainage.  - Eyes: Conjunctivae clear, EOM movements normal. PERRLA. No scleral icterus.  Neck: Normal range of motion. Neck supple. No JVD present. No thyromegaly present.  Cardiovascular: Normal rate, regular rhythm and normal heart sounds.  No murmur heard.  Pulmonary/Chest: Effort normal and breath sounds normal. No respiratory distress. Abdominal: Soft, obese. Bowel sounds are normal, no distension. There is no tenderness. no masses BREAST: Bilateral large pendulous tissue, breast exam normal with no masses, skin changes or nipple discharge FEMALE GENITALIA: Deferred RECTAL: Deferred  Musculoskeletal: Normal range of motion bilateral UE and LE, no joint effusions. Crepitus R>L knee present. Lumbar spine with no palpable step off or point tenderness.  Peripheral vascular: Bilateral LE no edema. Neurological: CN II-XII grossly intact with no focal deficits. Alert and oriented to person, place, and time. Coordination, balance, strength, speech and gait are  normal.  Skin: Skin is warm and dry. No rash noted. No erythema.  Psychiatric: Patient has a sad and tearful mood and affect. Behavior is normal in office today. Judgment and thought content normal in office today.   Assessment & Plan   1. Annual physical exam Addressed several preventative needs today as well as chronic needs and acute needs.   2. Encounter for screening for malignant neoplasm of colon Due for repeat colonoscope for colon cancer screening.  - Ambulatory referral to  General Surgery  3. Encounter for screening mammogram for malignant neoplasm of breast Due for mammogram.  - MM Digital Screening; Future  4. Urinary frequency Udip does not reveal significant infection however I will culture it and check her serum glucose to make sure hyperglycemia is not causing increased diuresis. She has been pre-diabetic in the past.  - POCT urinalysis dipstick - Glucose - Urine Culture  5. Major depressive disorder, recurrent, severe without psychotic features Recurrent. I will make sure her thyroid disorder is well controled and not contributing to her mood disorder. Discussed treatment options and decided on SSRI. She would not reveal any details about any stressors in her life but she is visibly sad today and denies suicidal plans or attempts. She has been encouraged to seek medical attention ASAP if she has worsening moods. The patient has been counseled on the proper use, side effects and potential interactions of the new medication. Patient encouraged to review the side effects and safety profile pamphlet provided with the prescription from the pharmacy as well as request counseling from the pharmacy team as needed.   - FLUoxetine (PROZAC) 20 MG tablet; Take 1 tablet (20 mg total) by mouth daily.  Dispense: 30 tablet; Refill: 3  6. Hypertension goal BP (blood pressure) < 140/90 Elevated today. May be due to emotional changes but will have her follow up closely and if  persistently elevated will adjust medication regimen.  - Glucose  7. Asthma, mild intermittent, well-controlled Clinically stable findings based on clinical exam and on review of any pertinent results. Recommended to patient that they continue their current regimen with regular follow ups.  - montelukast (SINGULAIR) 10 MG tablet; Take 1 tablet (10 mg total) by mouth at bedtime.  Dispense: 90 tablet; Refill: 3  8. Hypothyroidism due to acquired atrophy of thyroid Routine lab work. Refill medication after reviewing lab results.   - TSH - T3, free - T4, free - Glucose  9. Screening for STD (sexually transmitted disease)  - HIV antibody  10. Chronic pain of multiple joints Stable chronic problem.   11. Midline low back pain without sciatica Refilled tramadol for 60 day supply. We discussed potential pathology and long term risk of reoccurrence. Maintaining an ideal body habitus, regular exercise, proper lifting techniques and mindfulness of exacerbating factors will be useful in long term management.  Instructed on use of heating pad with exercises. Consider concomitant therapy with PT, massage therapist or chiropractor. May use anti-inflammatory medication and muscle relaxer as needed.  - traMADol (ULTRAM) 50 MG tablet; Take 1 tablet (50 mg total) by mouth every 12 (twelve) hours as needed.  Dispense: 120 tablet; Refill: 0

## 2015-04-15 ENCOUNTER — Other Ambulatory Visit: Payer: Self-pay | Admitting: Family Medicine

## 2015-04-15 DIAGNOSIS — E034 Atrophy of thyroid (acquired): Secondary | ICD-10-CM

## 2015-04-15 LAB — HIV ANTIBODY (ROUTINE TESTING W REFLEX): HIV Screen 4th Generation wRfx: NONREACTIVE

## 2015-04-15 LAB — T4, FREE: Free T4: 1.53 ng/dL (ref 0.82–1.77)

## 2015-04-15 LAB — GLUCOSE, RANDOM: Glucose: 113 mg/dL — ABNORMAL HIGH (ref 65–99)

## 2015-04-15 LAB — T3, FREE: T3, Free: 2.7 pg/mL (ref 2.0–4.4)

## 2015-04-15 LAB — TSH: TSH: 4.91 u[IU]/mL — ABNORMAL HIGH (ref 0.450–4.500)

## 2015-04-15 MED ORDER — LEVOTHYROXINE SODIUM 112 MCG PO TABS: 112.0000 ug | ORAL_TABLET | Freq: Every day | ORAL | Status: DC

## 2015-04-16 ENCOUNTER — Other Ambulatory Visit: Payer: Self-pay | Admitting: Family Medicine

## 2015-04-16 ENCOUNTER — Telehealth: Payer: Self-pay | Admitting: Family Medicine

## 2015-04-16 LAB — URINE CULTURE

## 2015-04-16 LAB — PLEASE NOTE

## 2015-04-16 MED ORDER — CIPROFLOXACIN HCL 500 MG PO TABS: 500.0000 mg | ORAL_TABLET | Freq: Two times a day (BID) | ORAL | Status: DC

## 2015-04-16 NOTE — Telephone Encounter (Signed)
Please let Beverly Barrett know that I sent antibiotic prescription to her [place] on Sunday as her urine culture study came back positive for bacteria growth confirming UTI.

## 2015-04-17 ENCOUNTER — Telehealth: Payer: Self-pay | Admitting: Gastroenterology

## 2015-04-17 ENCOUNTER — Other Ambulatory Visit: Payer: Self-pay

## 2015-04-17 NOTE — Telephone Encounter (Signed)
I tried to contact this patient to review the results from her most recent labs, but there was no answer. A message was left for the patient to give us a call when she got the chance.   

## 2015-04-17 NOTE — Telephone Encounter (Signed)
Patient was informed of the results from her blood work as well as her urine cx. Patient was encouraged to take the full dose of the atb and to increase her water intake. Patient was informed that is her sx persists then she should give [place] a call. Patient agreed and said thanks.

## 2015-04-17 NOTE — Telephone Encounter (Signed)
Gastroenterology Pre-Procedure Review  Request Date: 05-01-2015 Requesting Physician: Dr.  PATIENT REVIEW QUESTIONS: The patient responded to the following health history questions as indicated:    1. Are you having any GI issues? no 2. Do you have a personal history of Polyps? yes ( ) 3. Do you have a family history of Colon Cancer or Polyps? no 4. Diabetes Mellitus? no 5. Joint replacements in the past 12 months?no 6. Major health problems in the past 3 months?no 7. Any artificial heart valves, MVP, or defibrillator?no    MEDICATIONS & ALLERGIES:    Patient reports the following regarding taking any anticoagulation/antiplatelet therapy:   Plavix, Coumadin, Eliquis, Xarelto, Lovenox, Pradaxa, Brilinta, or Effient? no Aspirin? yes (Daily)  Patient confirms/reports the following medications:  Current Outpatient Prescriptions  Medication Sig Dispense Refill   aspirin 81 MG EC tablet Take 81 mg by mouth daily.       Aspirin-Acetaminophen (GOODYS BODY PAIN PO) Take by mouth.     atorvastatin (LIPITOR) 20 MG tablet TAKE ONE TABLET BY MOUTH AT BEDTIME 90 tablet 2   Azelastine HCl 0.15 % SOLN   3   carvedilol (COREG) 3.125 MG tablet Take 3.125 mg by mouth 2 (two) times daily with a meal.       ciprofloxacin (CIPRO) 500 MG tablet Take 1 tablet (500 mg total) by mouth 2 (two) times daily. 20 tablet 0   diclofenac (VOLTAREN) 75 MG EC tablet TAKE ONE TABLET BY MOUTH EVERY 12 HOURS AS NEEDED 60 tablet 3   docusate sodium (COLACE) 100 MG capsule Take 100 mg by mouth 2 (two) times daily.     FLUoxetine (PROZAC) 20 MG tablet Take 1 tablet (20 mg total) by mouth daily. 30 tablet 3   fluticasone (FLONASE) 50 MCG/ACT nasal spray by Nasal route.     levothyroxine (SYNTHROID, LEVOTHROID) 112 MCG tablet Take 1 tablet (112 mcg total) by mouth daily before breakfast. 90 tablet 2   Loratadine 10 MG CAPS Take by mouth.     losartan-hydrochlorothiazide (HYZAAR) 100-25 MG per tablet TAKE ONE  TABLET BY MOUTH ONCE DAILY 30 tablet 3   montelukast (SINGULAIR) 10 MG tablet Take 1 tablet (10 mg total) by mouth at bedtime. 90 tablet 3   nitroGLYCERIN (NITROSTAT) 0.4 MG SL tablet Place 1 tablet (0.4 mg total) under the tongue every 5 (five) minutes as needed for chest pain. 25 tablet 12   omeprazole (PRILOSEC) 40 MG capsule   10   traMADol (ULTRAM) 50 MG tablet Take 1 tablet (50 mg total) by mouth every 12 (twelve) hours as needed. 120 tablet 0   No current facility-administered medications for this visit.    Patient confirms/reports the following allergies:  No Known Allergies  No orders of the defined types were placed in this encounter.    AUTHORIZATION INFORMATION Primary Insurance: 1D#: Group #:  Secondary Insurance: 1D#: Group #:  SCHEDULE INFORMATION: Date: 05-01-2015 Time: Location:[facility]

## 2015-04-19 ENCOUNTER — Ambulatory Visit
Admission: RE | Admit: 2015-04-19 | Discharge: 2015-04-19 | Disposition: A | Discharge status: Home / Self Care | Payer: BLUE CROSS/BLUE SHIELD | Source: Ambulatory Visit | Attending: Family Medicine | Admitting: Family Medicine

## 2015-04-19 DIAGNOSIS — Z1231 Encounter for screening mammogram for malignant neoplasm of breast: Secondary | ICD-10-CM | POA: Diagnosis present

## 2015-04-24 ENCOUNTER — Encounter: Payer: Self-pay | Admitting: *Deleted

## 2015-04-25 ENCOUNTER — Encounter: Payer: Self-pay | Admitting: Anesthesiology

## 2015-04-29 NOTE — Discharge Instructions (Signed)

## 2015-05-01 ENCOUNTER — Ambulatory Visit
Admission: RE | Admit: 2015-05-01 | Payer: BLUE CROSS/BLUE SHIELD | Source: Ambulatory Visit | Admitting: Gastroenterology

## 2015-05-01 HISTORY — DX: Sleep apnea, unspecified: G47.30

## 2015-05-01 HISTORY — DX: Gastro-esophageal reflux disease without esophagitis: K21.9

## 2015-05-01 SURGERY — COLONOSCOPY WITH PROPOFOL
Anesthesia: Choice

## 2015-05-09 ENCOUNTER — Other Ambulatory Visit: Payer: Self-pay

## 2015-05-09 DIAGNOSIS — Z1211 Encounter for screening for malignant neoplasm of colon: Secondary | ICD-10-CM

## 2015-05-09 DIAGNOSIS — Z1212 Encounter for screening for malignant neoplasm of rectum: Principal | ICD-10-CM

## 2015-05-09 MED ORDER — SOD PICOSULFATE-MAG OX-CIT ACD 10-3.5-12 MG-GM-GM PO PACK: 1.0000 | PACK | ORAL | Status: DC

## 2015-05-16 ENCOUNTER — Encounter: Payer: Self-pay | Admitting: Family Medicine

## 2015-05-16 ENCOUNTER — Ambulatory Visit (INDEPENDENT_AMBULATORY_CARE_PROVIDER_SITE_OTHER): Payer: BLUE CROSS/BLUE SHIELD | Admitting: Family Medicine

## 2015-05-16 VITALS — BP 144/88 | HR 85 | Temp 98.7°F | Resp 16 | Ht 62.0 in | Wt 251.5 lb

## 2015-05-16 DIAGNOSIS — R350 Frequency of micturition: Secondary | ICD-10-CM | POA: Diagnosis not present

## 2015-05-16 DIAGNOSIS — I10 Essential (primary) hypertension: Secondary | ICD-10-CM

## 2015-05-16 DIAGNOSIS — E038 Other specified hypothyroidism: Secondary | ICD-10-CM

## 2015-05-16 DIAGNOSIS — F332 Major depressive disorder, recurrent severe without psychotic features: Secondary | ICD-10-CM

## 2015-05-16 LAB — POCT URINALYSIS DIPSTICK
Bilirubin, UA: NEGATIVE
GLUCOSE UA: NEGATIVE
KETONES UA: NEGATIVE
Nitrite, UA: NEGATIVE
Protein, UA: NEGATIVE
SPEC GRAV UA: 1.02
Urobilinogen, UA: 0.2
pH, UA: 5.5

## 2015-05-16 MED ORDER — PAROXETINE HCL 40 MG PO TABS
40.0000 mg | ORAL_TABLET | ORAL | Status: DC
Start: 2015-05-16 — End: 2015-06-16

## 2015-05-16 MED ORDER — SULFAMETHOXAZOLE-TRIMETHOPRIM 800-160 MG PO TABS: 1.0000 | ORAL_TABLET | Freq: Two times a day (BID) | ORAL | Status: DC

## 2015-05-16 MED ORDER — DIVALPROEX SODIUM 250 MG PO DR TAB
250.0000 mg | DELAYED_RELEASE_TABLET | Freq: Every evening | ORAL | Status: DC
Start: 2015-05-16 — End: 2015-06-16

## 2015-05-16 NOTE — Progress Notes (Signed)
Name: Beverly Barrett   MRN: [id number]    DOB: 02-15-1961   Date:05/16/2015       Progress Note  Subjective  Chief Complaint  Chief Complaint  Patient presents with  . Follow-up    1 month  . Depression    Started paxil last visit pt states unchanged  . Back Pain    for sciatica started tramadol liast visit takes prn  . Urinary Tract Infection    onset 1 month still having frequency was given cipro at last visit    HPI  Major Depression: seen by Dr. Nadine Counts one month ago, she was very depressed, thinking about suicide, but was started on Paxil 20 mg. She is tolerating medication well and has been compliant. No longer has suicidal thoughts. She still has anhedonia, daily crying spells, feels anxious, she is not sleeping well, interrupted sleep. Feels down most of the time. She  Has noticed decrease in appetite, but no weight loss  UTI: treated for E. coli infection one month ago, and symptoms improved for a few days and symptoms returned again, no dysuria, only has urinary frequency, hesitancy, and sometimes urgency. No blood in urine, no upper back pain, no fever.   HTN: bp is elevated today, and also high on her last visit, discussed medication adjustments but we will hold off until Dr. Nadine Counts is back. No chest pain or palpitation.     Patient Active Problem List   Diagnosis Date Noted  . Frequency 04/14/2015  . Encounter for screening for malignant neoplasm of colon 04/14/2015  . Encounter for screening mammogram for malignant neoplasm of breast 04/14/2015  . Encounter for cholesteral screening for cardiovascular disease 04/14/2015  . Screening for STD (sexually transmitted disease) 04/14/2015  . Midline low back pain without sciatica 04/14/2015  . Chronic pain of multiple joints 06/14/2014  . Dysmetabolic syndrome 96/22/2979  . Acid reflux 06/14/2014  . Extreme obesity ([place]) 06/14/2014  . Arthritis, degenerative 06/14/2014  . Adult hypothyroidism 08/18/2013  .  Hyperlipidemia 12/07/2010  . Obesity 12/07/2010  . Hypertension goal BP (blood pressure) < 140/90 12/07/2010  . Familial multiple lipoprotein-type hyperlipidemia 12/07/2010  . Chest pain 11/16/2010  . CN (constipation) 11/21/2009  . Allergic rhinitis 10/13/2008  . Asthma, mild intermittent, well-controlled 09/01/2008  . Clinical depression 08/13/2007    Past Surgical History  Procedure Laterality Date  . Abdominal hysterectomy    . Cesarean section      Family History  Problem Relation Age of Onset  . Diabetes Sister   . Hypertension Sister   . Cirrhosis Mother   . Cirrhosis Father   . Breast cancer Sister   . Heart attack Brother     Social History   Social History  . Marital Status: Married    Spouse Name: N/A  . Number of Children: N/A  . Years of Education: N/A   Occupational History  . Not on file.   Social History Main Topics  . Smoking status: Never Smoker   . Smokeless tobacco: Never Used  . Alcohol Use: No  . Drug Use: No  . Sexual Activity:    Partners: Male   Other Topics Concern  . Not on file   Social History Narrative     Current outpatient prescriptions:  .  aspirin 81 MG EC tablet, Take 81 mg by mouth daily.  , Disp: , Rfl:  .  Aspirin-Acetaminophen (GOODYS BODY PAIN PO), Take 1 Package by mouth as needed. , Disp: , Rfl:  .  atorvastatin (LIPITOR) 20 MG tablet, TAKE ONE TABLET BY MOUTH AT BEDTIME, Disp: 90 tablet, Rfl: 2 .  Azelastine HCl 0.15 % SOLN, Place 2 sprays into both nostrils 2 (two) times daily. , Disp: , Rfl: 3 .  diclofenac (VOLTAREN) 75 MG EC tablet, TAKE ONE TABLET BY MOUTH EVERY 12 HOURS AS NEEDED, Disp: 60 tablet, Rfl: 3 .  docusate sodium (COLACE) 100 MG capsule, Take 100 mg by mouth 2 (two) times daily., Disp: , Rfl:  .  FLUoxetine (PROZAC) 20 MG tablet, Take 1 tablet (20 mg total) by mouth daily., Disp: 30 tablet, Rfl: 3 .  fluticasone (FLONASE) 50 MCG/ACT nasal spray, by Nasal route., Disp: , Rfl:  .  levothyroxine  (SYNTHROID, LEVOTHROID) 112 MCG tablet, Take 1 tablet (112 mcg total) by mouth daily before breakfast., Disp: 90 tablet, Rfl: 2 .  Loratadine 10 MG CAPS, Take by mouth., Disp: , Rfl:  .  losartan-hydrochlorothiazide (HYZAAR) 100-25 MG per tablet, TAKE ONE TABLET BY MOUTH ONCE DAILY, Disp: 30 tablet, Rfl: 3 .  montelukast (SINGULAIR) 10 MG tablet, Take 1 tablet (10 mg total) by mouth at bedtime., Disp: 90 tablet, Rfl: 3 .  nitroGLYCERIN (NITROSTAT) 0.4 MG SL tablet, Place 1 tablet (0.4 mg total) under the tongue every 5 (five) minutes as needed for chest pain., Disp: 25 tablet, Rfl: 12 .  omeprazole (PRILOSEC) 40 MG capsule, , Disp: , Rfl: 10 .  Sod Picosulfate-Mag Ox-Cit Acd 10-3.5-12 MG-GM-GM PACK, Take 1 Container by mouth as directed., Disp: 1 each, Rfl: 0 .  traMADol (ULTRAM) 50 MG tablet, Take 1 tablet (50 mg total) by mouth every 12 (twelve) hours as needed., Disp: 120 tablet, Rfl: 0  Allergies  Allergen Reactions  . Latex     Pt unsure of reaction pt states something happened while in the hospital after surgery.     ROS  Constitutional: Negative for fever or weight change.  Respiratory: Negative for cough and shortness of breath.   Cardiovascular: Negative for chest pain or palpitations.  Gastrointestinal: Negative for abdominal pain, no bowel changes.  Musculoskeletal: Negative for gait problem or joint swelling.  Skin: Negative for rash.  Neurological: Negative for dizziness or headache.  No other specific complaints in a complete review of systems (except as listed in HPI above).  Objective  Filed Vitals:   05/16/15 0856  BP: 142/86  Pulse: 85  Temp: 98.7 F (37.1 C)  TempSrc: Oral  Resp: 16  Height: 5\' 2"  (1.575 m)  Weight: 251 lb 8 oz (114.08 kg)  SpO2: 94%    Body mass index is 45.99 kg/(m^2).  Physical Exam  Constitutional: Patient appears well-developedObese  No distress.  HEENT: head atraumatic, normocephalic, pupils equal and reactive to light,  neck  supple, throat within normal limits Cardiovascular: Normal rate, regular rhythm and normal heart sounds.  No murmur heard. No BLE edema. Pulmonary/Chest: Effort normal and breath sounds normal. No respiratory distress. Abdominal: Soft.  There is no tenderness. Psychiatric: Patient has a normal mood and affect. behavior is normal. Judgment and thought content normal. She was shaking her legs constantly during the visit.  Recent Results (from the past 2160 hour(s))  Urine Culture     Status: Abnormal   Collection Time: 04/14/15 12:00 AM  Result Value Ref Range   Urine Culture, Routine Final report (A)    Urine Culture result 1 Comment (A)     Comment: Escherichia coli, identified by an automated biochemical system. Greater than 100,000 colony forming units per  mL    ANTIMICROBIAL SUSCEPTIBILITY Comment     Comment:       ** S = Susceptible; I = Intermediate; R = Resistant **                    P = Positive; N = Negative             MICS are expressed in micrograms per mL    Antibiotic                 RSLT#1    RSLT#2    RSLT#3    RSLT#4 Amoxicillin/Clavulanic Acid    S Ampicillin                     S Cefepime                       S Ceftriaxone                    S Cefuroxime                     S Cephalothin                    S Ciprofloxacin                  S Ertapenem                      S Gentamicin                     S Imipenem                       S Levofloxacin                   S Nitrofurantoin                 S Piperacillin                   S Tetracycline                   S Tobramycin                     S Trimethoprim/Sulfa             S   Please Note     Status: None   Collection Time: 04/14/15 12:00 AM  Result Value Ref Range   Please note Comment     Comment: The date and/or time of collection was not indicated on the requisition as required by state and federal law.  The date of receipt of the specimen was used as the collection date if not supplied.    POCT urinalysis dipstick     Status: Abnormal   Collection Time: 04/14/15  8:48 AM  Result Value Ref Range   Color, UA YELLOW    Clarity, UA DARK    Glucose, UA NEGATIVE    Bilirubin, UA NEGATIVE    Ketones, UA NEGATIVE    Spec Grav, UA 1.025    Blood, UA SMALL    pH, UA 6.0    Protein, UA TRACE    Urobilinogen, UA 0.2    Nitrite, UA NEGATIVE    Leukocytes, UA Negative Negative  TSH     Status: Abnormal  Collection Time: 04/14/15  9:22 AM  Result Value Ref Range   TSH 4.910 (H) 0.450 - 4.500 uIU/mL  T3, free     Status: None   Collection Time: 04/14/15  9:22 AM  Result Value Ref Range   T3, Free 2.7 2.0 - 4.4 pg/mL  T4, free     Status: None   Collection Time: 04/14/15  9:22 AM  Result Value Ref Range   Free T4 1.53 0.82 - 1.77 ng/dL  HIV antibody     Status: None   Collection Time: 04/14/15  9:22 AM  Result Value Ref Range   HIV Screen 4th Generation wRfx Non Reactive Non Reactive  Glucose     Status: Abnormal   Collection Time: 04/14/15  9:22 AM  Result Value Ref Range   Glucose 113 (H) 65 - 99 mg/dL  POCT urinalysis dipstick     Status: Abnormal   Collection Time: 05/16/15  9:24 AM  Result Value Ref Range   Color, UA yellow    Clarity, UA clear    Glucose, UA negative    Bilirubin, UA negative    Ketones, UA negative    Spec Grav, UA 1.020    Blood, UA moderate    pH, UA 5.5    Protein, UA negative    Urobilinogen, UA 0.2    Nitrite, UA negative    Leukocytes, UA moderate (2+) (A) Negative    PHQ2/9: Depression screen PHQ 2/9 04/14/2015  Decreased Interest 1  Down, Depressed, Hopeless 3  PHQ - 2 Score 4  Altered sleeping 3  Tired, decreased energy 2  Change in appetite 2  Feeling bad or failure about yourself  2  Trouble concentrating 2  Moving slowly or fidgety/restless 1  Suicidal thoughts 1  PHQ-9 Score 17  Difficult doing work/chores Very difficult     Fall Risk: Fall Risk  04/14/2015  Falls in the past year? Yes  Number falls in past  yr: 1  Injury with Fall? Yes    Assessment & Plan  1. Urinary frequency  Recurrent UTI, we will try Septra this time, but if recurrence, need to look for other causes such as vaginal atrophy, post-residual urine. - POCT urinalysis dipstick - Urine culture  2. Hypertension goal BP (blood pressure) < 140/90  If bp remains high consider Exforge HCTZ  3. Other specified hypothyroidism  Last TSH was elevated, and may be contributing for depression symptoms, recheck level in 2 weeks, dose already adjusted by Dr. Nadine Counts one month ago  - TSH; Future  4. Major depressive disorder, recurrent, severe without psychotic features ([place])  We will adjust dose of Paxil, discussed serotonin syndrome, secondary to use of Tramdol, also will add Depakote for agitation, even though she does not have bipolar symptoms, it may be used off label as antidepressant adjunctive therapy with a low cost - PARoxetine (PAXIL) 40 MG tablet; Take 1 tablet (40 mg total) by mouth every morning.  Dispense: 30 tablet; Refill: 0 - divalproex (DEPAKOTE) 250 MG DR tablet; Take 1 tablet (250 mg total) by mouth every evening.  Dispense: 30 tablet; Refill: 0

## 2015-05-18 LAB — URINE CULTURE

## 2015-05-19 ENCOUNTER — Telehealth: Payer: Self-pay

## 2015-05-19 NOTE — Telephone Encounter (Signed)
-----   Message from Steele Sizer, MD sent at 05/18/2015  4:57 PM EDT ----- She had an UTI, treated with Sulfa and bacteria is sensitive to sulfa, symptoms should be better by now

## 2015-05-19 NOTE — Telephone Encounter (Signed)
Left message for patient to call back for results.  

## 2015-05-22 ENCOUNTER — Encounter: Payer: Self-pay | Admitting: *Deleted

## 2015-05-25 NOTE — Discharge Instructions (Signed)

## 2015-05-29 ENCOUNTER — Encounter: Payer: Self-pay | Admitting: *Deleted

## 2015-05-29 ENCOUNTER — Other Ambulatory Visit: Payer: Self-pay | Admitting: Gastroenterology

## 2015-05-29 ENCOUNTER — Ambulatory Visit: Payer: BLUE CROSS/BLUE SHIELD | Admitting: Anesthesiology

## 2015-05-29 ENCOUNTER — Ambulatory Visit
Admission: RE | Admit: 2015-05-29 | Discharge: 2015-05-29 | Disposition: A | Discharge status: Home / Self Care | Payer: BLUE CROSS/BLUE SHIELD | Source: Ambulatory Visit | Attending: Gastroenterology | Admitting: Gastroenterology

## 2015-05-29 ENCOUNTER — Encounter
Admission: RE | Disposition: A | Discharge status: Home / Self Care | Payer: Self-pay | Source: Ambulatory Visit | Attending: Gastroenterology

## 2015-05-29 DIAGNOSIS — J45909 Unspecified asthma, uncomplicated: Secondary | ICD-10-CM | POA: Insufficient documentation

## 2015-05-29 DIAGNOSIS — Z7982 Long term (current) use of aspirin: Secondary | ICD-10-CM | POA: Insufficient documentation

## 2015-05-29 DIAGNOSIS — I10 Essential (primary) hypertension: Secondary | ICD-10-CM | POA: Insufficient documentation

## 2015-05-29 DIAGNOSIS — E039 Hypothyroidism, unspecified: Secondary | ICD-10-CM | POA: Insufficient documentation

## 2015-05-29 DIAGNOSIS — F329 Major depressive disorder, single episode, unspecified: Secondary | ICD-10-CM | POA: Insufficient documentation

## 2015-05-29 DIAGNOSIS — K5730 Diverticulosis of large intestine without perforation or abscess without bleeding: Secondary | ICD-10-CM | POA: Insufficient documentation

## 2015-05-29 DIAGNOSIS — G4730 Sleep apnea, unspecified: Secondary | ICD-10-CM | POA: Diagnosis not present

## 2015-05-29 DIAGNOSIS — E785 Hyperlipidemia, unspecified: Secondary | ICD-10-CM | POA: Insufficient documentation

## 2015-05-29 DIAGNOSIS — Z1211 Encounter for screening for malignant neoplasm of colon: Secondary | ICD-10-CM | POA: Diagnosis not present

## 2015-05-29 DIAGNOSIS — G8929 Other chronic pain: Secondary | ICD-10-CM | POA: Diagnosis not present

## 2015-05-29 DIAGNOSIS — M545 Low back pain: Secondary | ICD-10-CM | POA: Insufficient documentation

## 2015-05-29 DIAGNOSIS — D125 Benign neoplasm of sigmoid colon: Secondary | ICD-10-CM | POA: Diagnosis not present

## 2015-05-29 DIAGNOSIS — Z91040 Latex allergy status: Secondary | ICD-10-CM | POA: Insufficient documentation

## 2015-05-29 DIAGNOSIS — Z860100 Personal history of colon polyps, unspecified: Secondary | ICD-10-CM | POA: Insufficient documentation

## 2015-05-29 DIAGNOSIS — K219 Gastro-esophageal reflux disease without esophagitis: Secondary | ICD-10-CM | POA: Insufficient documentation

## 2015-05-29 DIAGNOSIS — K648 Other hemorrhoids: Secondary | ICD-10-CM | POA: Insufficient documentation

## 2015-05-29 HISTORY — DX: Low back pain, unspecified: M54.50

## 2015-05-29 HISTORY — DX: Headache: R51

## 2015-05-29 HISTORY — DX: Dizziness and giddiness: R42

## 2015-05-29 HISTORY — DX: Other chronic pain: G89.29

## 2015-05-29 HISTORY — DX: Motion sickness, initial encounter: T75.3XXA

## 2015-05-29 HISTORY — DX: Hypothyroidism, unspecified: E03.9

## 2015-05-29 HISTORY — DX: Presence of dental prosthetic device (complete) (partial): Z97.2

## 2015-05-29 HISTORY — DX: Reserved for inherently not codable concepts without codable children: IMO0001

## 2015-05-29 HISTORY — DX: Angina pectoris, unspecified: I20.9

## 2015-05-29 HISTORY — PX: COLONOSCOPY WITH PROPOFOL: SHX5780

## 2015-05-29 HISTORY — DX: Low back pain: M54.5

## 2015-05-29 HISTORY — PX: POLYPECTOMY: SHX5525

## 2015-05-29 HISTORY — DX: Headache, unspecified: R51.9

## 2015-05-29 SURGERY — COLONOSCOPY WITH PROPOFOL
Anesthesia: Monitor Anesthesia Care | Wound class: Contaminated

## 2015-05-29 MED ORDER — SIMETHICONE 40 MG/0.6ML PO SUSP
ORAL | Status: DC | PRN
  Administered 2015-05-29: 08:00:00

## 2015-05-29 MED ORDER — PROPOFOL 10 MG/ML IV BOLUS
INTRAVENOUS | Status: DC | PRN
  Administered 2015-05-29: 50 mg via INTRAVENOUS
  Administered 2015-05-29: 30 mg via INTRAVENOUS
  Administered 2015-05-29 (×3): 20 mg via INTRAVENOUS
  Administered 2015-05-29: 50 mg via INTRAVENOUS
  Administered 2015-05-29: 100 mg via INTRAVENOUS

## 2015-05-29 MED ORDER — LIDOCAINE HCL (CARDIAC) 20 MG/ML IV SOLN
INTRAVENOUS | Status: DC | PRN
  Administered 2015-05-29: 40 mg via INTRAVENOUS

## 2015-05-29 MED ORDER — LACTATED RINGERS IV SOLN
INTRAVENOUS | Status: DC
  Administered 2015-05-29: 08:00:00 via INTRAVENOUS

## 2015-05-29 SURGICAL SUPPLY — 28 items
CANISTER SUCT 1200ML W/VALVE (MISCELLANEOUS) ×3 IMPLANT
FCP ESCP3.2XJMB 240X2.8X (MISCELLANEOUS)
FORCEPS BIOP RAD 4 LRG CAP 4 (CUTTING FORCEPS) ×1 IMPLANT
FORCEPS BIOP RJ4 240 W/NDL (MISCELLANEOUS)
FORCEPS ESCP3.2XJMB 240X2.8X (MISCELLANEOUS) IMPLANT
GOWN CVR UNV OPN BCK APRN NK (MISCELLANEOUS) ×4 IMPLANT
GOWN ISOL THUMB LOOP REG UNIV (MISCELLANEOUS) ×6
HEMOCLIP INSTINCT (CLIP) IMPLANT
INJECTOR VARIJECT VIN23 (MISCELLANEOUS) IMPLANT
KIT CO2 TUBING (TUBING) IMPLANT
KIT DEFENDO VALVE AND CONN (KITS) IMPLANT
KIT ENDO PROCEDURE OLY (KITS) ×3 IMPLANT
LIGATOR MULTIBAND 6SHOOTER MBL (MISCELLANEOUS) IMPLANT
MARKER SPOT ENDO TATTOO 5ML (MISCELLANEOUS) IMPLANT
PAD GROUND ADULT SPLIT (MISCELLANEOUS) IMPLANT
SNARE SHORT THROW 13M SML OVAL (MISCELLANEOUS) IMPLANT
SNARE SHORT THROW 30M LRG OVAL (MISCELLANEOUS) IMPLANT
SPOT EX ENDOSCOPIC TATTOO (MISCELLANEOUS)
SUCTION POLY TRAP 4CHAMBER (MISCELLANEOUS) IMPLANT
TRAP SUCTION POLY (MISCELLANEOUS) IMPLANT
TUBING CONN 6MMX3.1M (TUBING)
TUBING SUCTION CONN 0.25 STRL (TUBING) IMPLANT
UNDERPAD 30X60 958B10 (PK) (MISCELLANEOUS) IMPLANT
VALVE BIOPSY ENDO (VALVE) IMPLANT
VARIJECT INJECTOR VIN23 (MISCELLANEOUS)
WATER AUXILLARY (MISCELLANEOUS) IMPLANT
WATER STERILE IRR 250ML POUR (IV SOLUTION) ×3 IMPLANT
WATER STERILE IRR 500ML POUR (IV SOLUTION) IMPLANT

## 2015-05-29 NOTE — Anesthesia Procedure Notes (Signed)
Procedure Name: MAC Performed by: Nobuko Gsell Pre-anesthesia Checklist: Patient identified, Emergency Drugs available, Suction available, Timeout performed and Patient being monitored Patient Re-evaluated:Patient Re-evaluated prior to inductionOxygen Delivery Method: Nasal cannula Placement Confirmation: positive ETCO2     

## 2015-05-29 NOTE — Anesthesia Preprocedure Evaluation (Addendum)
Anesthesia Evaluation  Patient identified by MRN, date of birth, ID band Patient awake    Reviewed: Allergy & Precautions, H&P , NPO status   Airway Mallampati: III   Neck ROM: full    Dental  (+) Lower Dentures   Pulmonary asthma , sleep apnea ,    Pulmonary exam normal        Cardiovascular hypertension, On Medications Normal cardiovascular exam     Neuro/Psych  Headaches,    GI/Hepatic GERD  Medicated,  Endo/Other  Hypothyroidism   Renal/GU      Musculoskeletal   Abdominal   Peds  Hematology negative hematology ROS (+)   Anesthesia Other Findings   Reproductive/Obstetrics                            Anesthesia Physical Anesthesia Plan  ASA: III  Anesthesia Plan: MAC   Post-op Pain Management:    Induction:   Airway Management Planned:   Additional Equipment:   Intra-op Plan:   Post-operative Plan:   Informed Consent: I have reviewed the patients History and Physical, chart, labs and discussed the procedure including the risks, benefits and alternatives for the proposed anesthesia with the patient or authorized representative who has indicated his/her understanding and acceptance.     Plan Discussed with: CRNA  Anesthesia Plan Comments:         Anesthesia Quick Evaluation

## 2015-05-29 NOTE — Transfer of Care (Signed)
Immediate Anesthesia Transfer of Care Note  Patient: Beverly Barrett  Procedure(s) Performed: Procedure(s) with comments: COLONOSCOPY WITH PROPOFOL (N/A) - Latex allergy sleep apnea - no CPAP machine (yet) POLYPECTOMY  Patient Location: PACU  Anesthesia Type: MAC  Level of Consciousness: awake, alert  and patient cooperative  Airway and Oxygen Therapy: Patient Spontanous Breathing and Patient connected to supplemental oxygen  Post-op Assessment: Post-op Vital signs reviewed, Patient's Cardiovascular Status Stable, Respiratory Function Stable, Patent Airway and No signs of Nausea or vomiting  Post-op Vital Signs: Reviewed and stable  Complications: No apparent anesthesia complications

## 2015-05-29 NOTE — Anesthesia Postprocedure Evaluation (Signed)
  Anesthesia Post-op Note  Patient: Beverly Barrett  Procedure(s) Performed: Procedure(s) with comments: COLONOSCOPY WITH PROPOFOL (N/A) - Latex allergy sleep apnea - no CPAP machine (yet) POLYPECTOMY  Anesthesia type:MAC  Patient location: PACU  Post pain: Pain level controlled  Post assessment: Post-op Vital signs reviewed, Patient's Cardiovascular Status Stable, Respiratory Function Stable, Patent Airway and No signs of Nausea or vomiting  Post vital signs: Reviewed and stable  Last Vitals:  Filed Vitals:   05/29/15 0823  BP:   Pulse: 70  Temp:   Resp: 24    Level of consciousness: awake, alert  and patient cooperative  Complications: No apparent anesthesia complications

## 2015-05-29 NOTE — Op Note (Addendum)
[facility] Patient Name: Beverly Barrett Procedure Date: 05/29/2015 7:51 AM MRN: [id number] Account #: [id number] Date of Birth: 1960-12-15 Admit Type: Outpatient Age: 54 Room: [facility] OR ROOM 01 Gender: Female Note Status: Supervisor Override Procedure:         Colonoscopy Indications:       High risk colon cancer surveillance: Personal history of                     colonic polyps Providers:         Lucilla Lame, MD Referring MD:      Bethena Roys. Sowles, MD (Referring MD) Medicines:         Propofol per Anesthesia Complications:     No immediate complications. Procedure:         Pre-Anesthesia Assessment:                    - Prior to the procedure, a History and Physical was                     performed, and patient medications and allergies were                     reviewed. The patient's tolerance of previous anesthesia                     was also reviewed. The risks and benefits of the procedure                     and the sedation options and risks were discussed with the                     patient. All questions were answered, and informed consent                     was obtained. Prior Anticoagulants: The patient has taken                     no previous anticoagulant or antiplatelet agents. ASA                     Grade Assessment: II - A patient with mild systemic                     disease. After reviewing the risks and benefits, the                     patient was deemed in satisfactory condition to undergo                     the procedure.                    After obtaining informed consent, the colonoscope was                     passed under direct vision. Throughout the procedure, the                     patient's blood pressure, pulse, and oxygen saturations                     were monitored continuously. The [place]  colonoscope (S#: [id number]) was introduced through the anus   and advanced to the the cecum, identified by appendiceal                     orifice and ileocecal valve. The colonoscopy was performed                     without difficulty. The patient tolerated the procedure                     well. The quality of the bowel preparation was excellent. Findings:      The perianal and digital rectal examinations were normal.      Two sessile polyps were found in the sigmoid colon. The polyps were 2 to       3 mm in size. These polyps were removed with a cold biopsy forceps.       Resection and retrieval were complete.      A few small-mouthed diverticula were found in the entire colon.      Non-bleeding internal hemorrhoids were found during retroflexion. The       hemorrhoids were Grade II (internal hemorrhoids that prolapse but reduce       spontaneously). Impression:        - Two 2 to 3 mm polyps in the sigmoid colon. Resected and                     retrieved.                    - Diverticulosis in the entire examined colon.                    - Non-bleeding internal hemorrhoids. Recommendation:    - Await pathology results.                    - Repeat colonoscopy in 5 years if polyp adenoma and 10                     years if hyperplastic Procedure Code(s): --- Professional ---                    [phone], Colonoscopy, flexible; with biopsy, single or                     multiple Diagnosis Code(s): --- Professional ---                    Z86.010, Personal history of colonic polyps                    D12.5, Benign neoplasm of sigmoid colon CPT copyright 2014 [employer]. All rights reserved. The codes documented in this report are preliminary and upon coder review may  be revised to meet current compliance requirements. Lucilla Lame, MD 05/29/2015 8:13:09 AM This report has been signed electronically. Number of Addenda: 0 Note Initiated On: 05/29/2015 7:51 AM Scope Withdrawal Time: 0 hours 6 minutes 51 seconds  Total Procedure  Duration: 0 hours 12 minutes 2 seconds       [facility]

## 2015-05-29 NOTE — H&P (Signed)
[facility]  [address], [place], [place] [address] Phone: [phone] Fax : [phone]  Primary Care Physician:  Loistine Chance, MD Primary Gastroenterologist:  Dr. Allen Norris  Pre-Procedure History & Physical: HPI:  Beverly Barrett is a 54 y.o. female is here for an colonoscopy.   Past Medical History  Diagnosis Date  . Hyperlipidemia   . Hypertension   . Asthma   . Depression   . GERD (gastroesophageal reflux disease)   . Sleep apnea     No machine  . Anginal pain ([place])     none in approx 2 yrs  . Shortness of breath dyspnea   . Headache     migraines -2x/month  . Hypothyroidism   . Chronic lower back pain   . Wears dentures     partial upper  . Vertigo   . Motion sickness     cars    Past Surgical History  Procedure Laterality Date  . Cesarean section    . Abdominal hysterectomy      Prior to Admission medications   Medication Sig Start Date End Date Taking? Authorizing Provider  aspirin 81 MG EC tablet Take 81 mg by mouth daily.     Yes Historical Provider, MD  Aspirin-Acetaminophen (GOODYS BODY PAIN PO) Take 1 Package by mouth as needed.    Yes Historical Provider, MD  Azelastine HCl 0.15 % SOLN Place 2 sprays into both nostrils 2 (two) times daily.  01/14/15  Yes Historical Provider, MD  diclofenac (VOLTAREN) 75 MG EC tablet TAKE ONE TABLET BY MOUTH EVERY 12 HOURS AS NEEDED 02/09/15  Yes Bobetta Lime, MD  divalproex (DEPAKOTE) 250 MG DR tablet Take 1 tablet (250 mg total) by mouth every evening. 05/16/15  Yes Steele Sizer, MD  docusate sodium (COLACE) 100 MG capsule Take 100 mg by mouth 2 (two) times daily.   Yes Historical Provider, MD  fluticasone (FLONASE) 50 MCG/ACT nasal spray by Nasal route. 04/11/14  Yes Historical Provider, MD  Loratadine 10 MG CAPS Take by mouth.   Yes Historical Provider, MD  Sod Picosulfate-Mag Ox-Cit Acd 10-3.5-12 MG-GM-GM PACK Take 1 Container by mouth as directed. 05/09/15  Yes Lucilla Lame, MD    traMADol (ULTRAM) 50 MG tablet Take 1 tablet (50 mg total) by mouth every 12 (twelve) hours as needed. 04/14/15  Yes Bobetta Lime, MD  atorvastatin (LIPITOR) 20 MG tablet TAKE ONE TABLET BY MOUTH AT BEDTIME 02/09/15   Bobetta Lime, MD  levothyroxine (SYNTHROID, LEVOTHROID) 112 MCG tablet Take 1 tablet (112 mcg total) by mouth daily before breakfast. 04/15/15   Bobetta Lime, MD  losartan-hydrochlorothiazide (HYZAAR) 100-25 MG per tablet TAKE ONE TABLET BY MOUTH ONCE DAILY 02/09/15   Bobetta Lime, MD  montelukast (SINGULAIR) 10 MG tablet Take 1 tablet (10 mg total) by mouth at bedtime. 04/14/15   Bobetta Lime, MD  nitroGLYCERIN (NITROSTAT) 0.4 MG SL tablet Place 1 tablet (0.4 mg total) under the tongue every 5 (five) minutes as needed for chest pain. 11/16/10 04/24/15  Thayer Headings, MD  omeprazole (PRILOSEC) 40 MG capsule  03/09/15   Historical Provider, MD  PARoxetine (PAXIL) 40 MG tablet Take 1 tablet (40 mg total) by mouth every morning. 05/16/15   Steele Sizer, MD  sulfamethoxazole-trimethoprim (BACTRIM DS) 800-160 MG tablet Take 1 tablet by mouth 2 (two) times daily. 05/16/15   Steele Sizer, MD    Allergies as of 05/09/2015 - Review Complete 04/24/2015  Allergen Reaction Noted  . Latex  04/24/2015  Family History  Problem Relation Age of Onset  . Diabetes Sister   . Hypertension Sister   . Cirrhosis Mother   . Cirrhosis Father   . Breast cancer Sister   . Heart attack Brother     Social History   Social History  . Marital Status: Married    Spouse Name: N/A  . Number of Children: N/A  . Years of Education: N/A   Occupational History  . Not on file.   Social History Main Topics  . Smoking status: Never Smoker   . Smokeless tobacco: Never Used  . Alcohol Use: No  . Drug Use: No  . Sexual Activity:    Partners: Male   Other Topics Concern  . Not on file   Social History Narrative    Review of Systems: See HPI, otherwise negative ROS  Physical  Exam: BP 137/85 mmHg  Pulse 72  Temp(Src) 97.5 F (36.4 C)  Resp 17  Ht 5\' 2"  (1.575 m)  Wt 245 lb (111.131 kg)  BMI 44.80 kg/m2  SpO2 97% General:   Alert,  pleasant and cooperative in NAD Head:  Normocephalic and atraumatic. Neck:  Supple; no masses or thyromegaly. Lungs:  Clear throughout to auscultation.    Heart:  Regular rate and rhythm. Abdomen:  Soft, nontender and nondistended. Normal bowel sounds, without guarding, and without rebound.   Neurologic:  Alert and  oriented x4;  grossly normal neurologically.  Impression/Plan: Beverly Barrett is here for an colonoscopy to be performed for history of colon polyps.  Risks, benefits, limitations, and alternatives regarding  colonoscopy have been reviewed with the patient.  Questions have been answered.  All parties agreeable.   Ollen Bowl, MD  05/29/2015, 7:34 AM

## 2015-05-30 ENCOUNTER — Encounter: Payer: Self-pay | Admitting: Gastroenterology

## 2015-05-31 ENCOUNTER — Other Ambulatory Visit: Payer: Self-pay | Admitting: Family Medicine

## 2015-06-01 LAB — TSH: TSH: 0.726 u[IU]/mL (ref 0.450–4.500)

## 2015-06-16 ENCOUNTER — Encounter: Payer: Self-pay | Admitting: Family Medicine

## 2015-06-16 ENCOUNTER — Ambulatory Visit (INDEPENDENT_AMBULATORY_CARE_PROVIDER_SITE_OTHER): Payer: BLUE CROSS/BLUE SHIELD | Admitting: Family Medicine

## 2015-06-16 VITALS — BP 126/84 | HR 98 | Temp 98.3°F | Resp 16 | Wt 246.5 lb

## 2015-06-16 DIAGNOSIS — F3341 Major depressive disorder, recurrent, in partial remission: Secondary | ICD-10-CM | POA: Diagnosis not present

## 2015-06-16 DIAGNOSIS — M545 Low back pain, unspecified: Secondary | ICD-10-CM

## 2015-06-16 DIAGNOSIS — I10 Essential (primary) hypertension: Secondary | ICD-10-CM | POA: Diagnosis not present

## 2015-06-16 DIAGNOSIS — E034 Atrophy of thyroid (acquired): Secondary | ICD-10-CM | POA: Diagnosis not present

## 2015-06-16 DIAGNOSIS — E038 Other specified hypothyroidism: Secondary | ICD-10-CM | POA: Diagnosis not present

## 2015-06-16 DIAGNOSIS — F418 Other specified anxiety disorders: Principal | ICD-10-CM

## 2015-06-16 MED ORDER — PAROXETINE HCL 40 MG PO TABS: 40.0000 mg | ORAL_TABLET | ORAL | Status: DC

## 2015-06-16 MED ORDER — DIVALPROEX SODIUM 250 MG PO DR TAB: 250.0000 mg | DELAYED_RELEASE_TABLET | Freq: Every evening | ORAL | Status: DC

## 2015-06-16 MED ORDER — TRAMADOL HCL 50 MG PO TABS: 50.0000 mg | ORAL_TABLET | Freq: Two times a day (BID) | ORAL | Status: DC | PRN

## 2015-06-16 NOTE — Progress Notes (Signed)
Name: Beverly Barrett   MRN: [id number]    DOB: 03/19/1961   Date:06/16/2015       Progress Note  Subjective  Chief Complaint  Chief Complaint  Patient presents with  . Depression    patient is here for her 15-month follow-up  . Hypothyroidism  . Hypertension  . Medication Refill    HPI  Beverly Barrett has a past history of depression and has recently been placed on Paroxetine 40 mg a day and more recently Depakote 250 mg DR one tablet in the evenings . She will not speak about stressors in life specifically but does report being tearful. In the past has had suicidal ideations but not since starting back on her medications. She complains of depressed mood, fatigue, feelings of worthlessness/guilt, hopelessness and insomnia. She denies current suicidal and homicidal plan or intent but has felt that she may be better off dead in the past month. Family history significant for anxiety and depression. Possible organic causes contributing are: endocrine/metabolic. Risk factors: previous episode of depression. Previous treatment includes she is not sure if she has been on Effexor in the past and individual therapy. In addition she reports symptoms of vague nausea without headaches, GI upset, change in bowel movements, focal neurological changes, chest pain. She has a diagnosis of HTN and is taking her medication Losartan-HCTZ 100-25mg  as instructed with no side effects, worsening swelling of LE, chest pain, SOB. Asthma is well controled on prn albuterol inhaler use and montelukast and controlling her allergies. Chronic joint pain involving lower back, hips, right greater than left knees stable on tramadol 50 mg twice a day. Needs several refills.  Past Medical History  Diagnosis Date  . Hyperlipidemia   . Hypertension   . Asthma   . Depression   . GERD (gastroesophageal reflux disease)   . Sleep apnea     No machine  . Anginal pain ([place])     none in approx 2 yrs  . Shortness of breath  dyspnea   . Headache     migraines -2x/month  . Hypothyroidism   . Chronic lower back pain   . Wears dentures     partial upper  . Vertigo   . Motion sickness     cars    Patient Active Problem List   Diagnosis Date Noted  . History of colonic polyps   . Benign neoplasm of sigmoid colon   . Frequency 04/14/2015  . Encounter for screening for malignant neoplasm of colon 04/14/2015  . Encounter for screening mammogram for malignant neoplasm of breast 04/14/2015  . Encounter for cholesteral screening for cardiovascular disease 04/14/2015  . Screening for STD (sexually transmitted disease) 04/14/2015  . Midline low back pain without sciatica 04/14/2015  . Chronic pain of multiple joints 06/14/2014  . Dysmetabolic syndrome [id number]  . Acid reflux 06/14/2014  . Extreme obesity ([place]) 06/14/2014  . Arthritis, degenerative 06/14/2014  . Obesity, diabetes, and hypertension syndrome ([place]) 06/14/2014  . Morbid obesity ([place]) 06/14/2014  . Adult hypothyroidism 08/18/2013  . Hyperlipidemia 12/07/2010  . Hypertension goal BP (blood pressure) < 140/90 12/07/2010  . Familial multiple lipoprotein-type hyperlipidemia 12/07/2010  . Chest pain 11/16/2010  . CN (constipation) 11/21/2009  . Allergic rhinitis 10/13/2008  . Asthma, mild intermittent, well-controlled 09/01/2008  . Major depression, recurrent ([place]) 08/13/2007    Social History  Substance Use Topics  . Smoking status: Never Smoker   . Smokeless tobacco: Never Used  . Alcohol Use: No     Current  outpatient prescriptions:  .  aspirin 81 MG EC tablet, Take 81 mg by mouth daily.  , Disp: , Rfl:  .  Aspirin-Acetaminophen (GOODYS BODY PAIN PO), Take 1 Package by mouth as needed. , Disp: , Rfl:  .  atorvastatin (LIPITOR) 20 MG tablet, TAKE ONE TABLET BY MOUTH AT BEDTIME, Disp: 90 tablet, Rfl: 2 .  Azelastine HCl 0.15 % SOLN, Place 2 sprays into both nostrils 2 (two) times daily. , Disp: , Rfl: 3 .  diclofenac (VOLTAREN) 75 MG EC  tablet, TAKE ONE TABLET BY MOUTH EVERY 12 HOURS AS NEEDED, Disp: 60 tablet, Rfl: 3 .  divalproex (DEPAKOTE) 250 MG DR tablet, Take 1 tablet (250 mg total) by mouth every evening., Disp: 30 tablet, Rfl: 0 .  docusate sodium (COLACE) 100 MG capsule, Take 100 mg by mouth 2 (two) times daily., Disp: , Rfl:  .  fluticasone (FLONASE) 50 MCG/ACT nasal spray, by Nasal route., Disp: , Rfl:  .  levothyroxine (SYNTHROID, LEVOTHROID) 112 MCG tablet, Take 1 tablet (112 mcg total) by mouth daily before breakfast., Disp: 90 tablet, Rfl: 2 .  Loratadine 10 MG CAPS, Take by mouth., Disp: , Rfl:  .  losartan-hydrochlorothiazide (HYZAAR) 100-25 MG per tablet, TAKE ONE TABLET BY MOUTH ONCE DAILY, Disp: 30 tablet, Rfl: 3 .  montelukast (SINGULAIR) 10 MG tablet, Take 1 tablet (10 mg total) by mouth at bedtime., Disp: 90 tablet, Rfl: 3 .  omeprazole (PRILOSEC) 40 MG capsule, , Disp: , Rfl: 10 .  PARoxetine (PAXIL) 40 MG tablet, Take 1 tablet (40 mg total) by mouth every morning., Disp: 30 tablet, Rfl: 0 .  Sod Picosulfate-Mag Ox-Cit Acd 10-3.5-12 MG-GM-GM PACK, Take 1 Container by mouth as directed., Disp: 1 each, Rfl: 0 .  traMADol (ULTRAM) 50 MG tablet, Take 1 tablet (50 mg total) by mouth every 12 (twelve) hours as needed., Disp: 120 tablet, Rfl: 0  Past Surgical History  Procedure Laterality Date  . Cesarean section    . Abdominal hysterectomy    . Colonoscopy with propofol N/A 05/29/2015    Procedure: COLONOSCOPY WITH PROPOFOL;  Surgeon: Lucilla Lame, MD;  Location: [facility];  Service: Endoscopy;  Laterality: N/A;  Latex allergy sleep apnea - no CPAP machine (yet)  . Polypectomy  05/29/2015    Procedure: POLYPECTOMY;  Surgeon: Lucilla Lame, MD;  Location: [facility];  Service: Endoscopy;;    Family History  Problem Relation Age of Onset  . Diabetes Sister   . Hypertension Sister   . Cirrhosis Mother   . Cirrhosis Father   . Breast cancer Sister   . Heart attack Brother     Allergies   Allergen Reactions  . Latex     Pt unsure of reaction pt states something happened while in the hospital after surgery.     Review of Systems  CONSTITUTIONAL: No significant weight changes, fever, chills, weakness. Yes fatigue.  CARDIOVASCULAR: No chest pain, chest pressure or chest discomfort. No palpitations or edema.  RESPIRATORY: No shortness of breath, cough or sputum.  GASTROINTESTINAL: No anorexia, vomiting. No changes in bowel habits. No abdominal pain or blood.  GENITOURINARY: No dysuria. No frequency. No discharge.  NEUROLOGICAL: No headache, dizziness, syncope, paralysis, ataxia, numbness or tingling in the extremities. No memory changes. No change in bowel or bladder control.  MUSCULOSKELETAL: Chronic joint pain. No muscle pain. HEMATOLOGIC: No anemia, bleeding or bruising.  LYMPHATICS: No enlarged lymph nodes.  PSYCHIATRIC: Yes improved change in mood. No change in sleep pattern.  ENDOCRINOLOGIC: No reports of sweating, cold or heat intolerance. No polyuria or polydipsia.    Objective  BP 126/84 mmHg  Pulse 98  Temp(Src) 98.3 F (36.8 C) (Oral)  Resp 16  Wt 246 lb 8 oz (111.812 kg)  SpO2 95% Body mass index is 45.07 kg/(m^2).  Physical Exam  Constitutional: Patient is obese and well-nourished. In no distress.  Cardiovascular: Normal rate, regular rhythm and normal heart sounds. No murmur heard.  Pulmonary/Chest: Effort normal and breath sounds normal. No respiratory distress.  Musculoskeletal: Normal range of motion bilateral UE and LE, no joint effusions. Crepitus R>L knee present. Lumbar spine with no palpable step off or point tenderness.  Peripheral vascular: Bilateral LE no edema. Neurological: CN II-XII grossly intact with no focal deficits. Alert and oriented to person, place, and time. Coordination, balance, strength, speech and gait are normal.  Skin: Skin is warm and dry. No rash noted. No erythema.  Psychiatric: Patient has a stable  (much improved) mood and affect. Behavior is normal in office today. Judgment and thought content normal in office today.   Recent Results (from the past 2160 hour(s))  Urine Culture     Status: Abnormal   Collection Time: 04/14/15 12:00 AM  Result Value Ref Range   Urine Culture, Routine Final report (A)    Urine Culture result 1 Comment (A)     Comment: Escherichia coli, identified by an automated biochemical system. Greater than 100,000 colony forming units per mL    ANTIMICROBIAL SUSCEPTIBILITY Comment     Comment:       ** S = Susceptible; I = Intermediate; R = Resistant **                    P = Positive; N = Negative             MICS are expressed in micrograms per mL    Antibiotic                 RSLT#1    RSLT#2    RSLT#3    RSLT#4 Amoxicillin/Clavulanic Acid    S Ampicillin                     S Cefepime                       S Ceftriaxone                    S Cefuroxime                     S Cephalothin                    S Ciprofloxacin                  S Ertapenem                      S Gentamicin                     S Imipenem                       S Levofloxacin                   S Nitrofurantoin                 S  Piperacillin                   S Tetracycline                   S Tobramycin                     S Trimethoprim/Sulfa             S   Please Note     Status: None   Collection Time: 04/14/15 12:00 AM  Result Value Ref Range   Please note Comment     Comment: The date and/or time of collection was not indicated on the requisition as required by state and federal law.  The date of receipt of the specimen was used as the collection date if not supplied.   POCT urinalysis dipstick     Status: Abnormal   Collection Time: 04/14/15  8:48 AM  Result Value Ref Range   Color, UA YELLOW    Clarity, UA DARK    Glucose, UA NEGATIVE    Bilirubin, UA NEGATIVE    Ketones, UA NEGATIVE    Spec Grav, UA 1.025    Blood, UA SMALL    pH, UA 6.0    Protein, UA  TRACE    Urobilinogen, UA 0.2    Nitrite, UA NEGATIVE    Leukocytes, UA Negative Negative  TSH     Status: Abnormal   Collection Time: 04/14/15  9:22 AM  Result Value Ref Range   TSH 4.910 (H) 0.450 - 4.500 uIU/mL  T3, free     Status: None   Collection Time: 04/14/15  9:22 AM  Result Value Ref Range   T3, Free 2.7 2.0 - 4.4 pg/mL  T4, free     Status: None   Collection Time: 04/14/15  9:22 AM  Result Value Ref Range   Free T4 1.53 0.82 - 1.77 ng/dL  HIV antibody     Status: None   Collection Time: 04/14/15  9:22 AM  Result Value Ref Range   HIV Screen 4th Generation wRfx Non Reactive Non Reactive  Glucose     Status: Abnormal   Collection Time: 04/14/15  9:22 AM  Result Value Ref Range   Glucose 113 (H) 65 - 99 mg/dL  Urine culture     Status: Abnormal   Collection Time: 05/16/15 12:00 AM  Result Value Ref Range   Urine Culture, Routine Final report (A)    Urine Culture result 1 Escherichia coli (A)     Comment: Greater than 100,000 colony forming units per mL   ANTIMICROBIAL SUSCEPTIBILITY Comment     Comment:       ** S = Susceptible; I = Intermediate; R = Resistant **                    P = Positive; N = Negative             MICS are expressed in micrograms per mL    Antibiotic                 RSLT#1    RSLT#2    RSLT#3    RSLT#4 Amoxicillin/Clavulanic Acid    S Ampicillin                     R Cefazolin  R Cefepime                       R Ceftriaxone                    R Cefuroxime                     R Cephalothin                    R Ciprofloxacin                  R Ertapenem                      S Gentamicin                     R Imipenem                       S Levofloxacin                   R Nitrofurantoin                 S Piperacillin                   R Tetracycline                   R Tobramycin                     I Trimethoprim/Sulfa             S   POCT urinalysis dipstick     Status: Abnormal   Collection Time: 05/16/15   9:24 AM  Result Value Ref Range   Color, UA yellow    Clarity, UA clear    Glucose, UA negative    Bilirubin, UA negative    Ketones, UA negative    Spec Grav, UA 1.020    Blood, UA moderate    pH, UA 5.5    Protein, UA negative    Urobilinogen, UA 0.2    Nitrite, UA negative    Leukocytes, UA moderate (2+) (A) Negative  TSH     Status: None   Collection Time: 05/31/15  9:36 AM  Result Value Ref Range   TSH 0.726 0.450 - 4.500 uIU/mL     Assessment & Plan  1. Major depressive disorder, recurrent episode, in partial remission with anxious distress (HCC) Improved. Continue current regimen.  - divalproex (DEPAKOTE) 250 MG DR tablet; Take 1 tablet (250 mg total) by mouth every evening.  Dispense: 90 tablet; Refill: 3 - PARoxetine (PAXIL) 40 MG tablet; Take 1 tablet (40 mg total) by mouth every morning.  Dispense: 90 tablet; Refill: 3  2. Hypertension goal BP (blood pressure) < 140/90 Improved.  3. Hypothyroidism due to acquired atrophy of thyroid Continue current dose of Levothyroxine. Lab results reviewed.  4. Midline low back pain without sciatica Stable. Refilled.  - traMADol (ULTRAM) 50 MG tablet; Take 1 tablet (50 mg total) by mouth every 12 (twelve) hours as needed.  Dispense: 120 tablet; Refill: 0

## 2015-07-15 ENCOUNTER — Other Ambulatory Visit: Payer: Self-pay | Admitting: Family Medicine

## 2015-07-21 ENCOUNTER — Other Ambulatory Visit: Payer: Self-pay | Admitting: Family Medicine

## 2015-08-08 ENCOUNTER — Other Ambulatory Visit: Payer: Self-pay

## 2015-08-14 ENCOUNTER — Telehealth: Payer: Self-pay | Admitting: Family Medicine

## 2015-08-14 NOTE — Telephone Encounter (Signed)
PT SAID THAT HER PHARM HAS CHANGED TO [facility]. PLEASE CHANGE IN THE SYSTEM

## 2015-08-15 MED ORDER — TRAMADOL HCL 50 MG PO TABS: 50.0000 mg | ORAL_TABLET | Freq: Two times a day (BID) | ORAL | Status: DC | PRN

## 2015-08-28 ENCOUNTER — Other Ambulatory Visit: Payer: Self-pay

## 2015-08-31 ENCOUNTER — Other Ambulatory Visit: Payer: Self-pay

## 2015-09-13 ENCOUNTER — Other Ambulatory Visit: Payer: Self-pay

## 2015-09-13 MED ORDER — OMEPRAZOLE 40 MG PO CPDR
40.0000 mg | DELAYED_RELEASE_CAPSULE | Freq: Every day | ORAL | Status: DC
Start: 2015-09-13 — End: 2016-03-04

## 2015-09-13 MED ORDER — TRAMADOL HCL 50 MG PO TABS: 50.0000 mg | ORAL_TABLET | Freq: Two times a day (BID) | ORAL | Status: DC | PRN

## 2015-09-13 NOTE — Telephone Encounter (Signed)
Mail order stating they never received tramadol

## 2015-09-15 ENCOUNTER — Ambulatory Visit: Payer: BLUE CROSS/BLUE SHIELD | Admitting: Family Medicine

## 2015-09-18 ENCOUNTER — Ambulatory Visit: Payer: BLUE CROSS/BLUE SHIELD | Admitting: Family Medicine

## 2015-09-22 ENCOUNTER — Ambulatory Visit (INDEPENDENT_AMBULATORY_CARE_PROVIDER_SITE_OTHER): Payer: BLUE CROSS/BLUE SHIELD | Admitting: Family Medicine

## 2015-09-22 ENCOUNTER — Encounter: Payer: Self-pay | Admitting: Family Medicine

## 2015-09-22 VITALS — BP 122/74 | HR 98 | Temp 98.9°F | Resp 18 | Ht 62.0 in | Wt 247.3 lb

## 2015-09-22 DIAGNOSIS — E038 Other specified hypothyroidism: Secondary | ICD-10-CM | POA: Diagnosis not present

## 2015-09-22 DIAGNOSIS — E034 Atrophy of thyroid (acquired): Secondary | ICD-10-CM | POA: Diagnosis not present

## 2015-09-22 DIAGNOSIS — F3341 Major depressive disorder, recurrent, in partial remission: Secondary | ICD-10-CM

## 2015-09-22 DIAGNOSIS — Z6841 Body Mass Index (BMI) 40.0 and over, adult: Secondary | ICD-10-CM

## 2015-09-22 DIAGNOSIS — I10 Essential (primary) hypertension: Secondary | ICD-10-CM | POA: Diagnosis not present

## 2015-09-22 DIAGNOSIS — R739 Hyperglycemia, unspecified: Secondary | ICD-10-CM | POA: Diagnosis not present

## 2015-09-22 DIAGNOSIS — F418 Other specified anxiety disorders: Principal | ICD-10-CM

## 2015-09-22 NOTE — Progress Notes (Signed)
Name: Beverly Barrett   MRN: [id number]    DOB: Mar 09, 1961   Date:09/22/2015       Progress Note  Subjective  Chief Complaint  Chief Complaint  Patient presents with  . Depression    pt here for 3 month follow up  . Hypertension  . Anxiety    HPI  Beverly Barrett is a 55 year old female here for follow up of recent mood fluctuations. She has a long history of depression and has recently (2016) been placed on Paroxetine 40 mg in the morning and Depakote 250 mg DR one tablet in the evenings . She notes some reflux like burning in her chest for a few mins after taking Paroxetine in the morning. Subsides on its own. Previously she would not speak about stressors in life specifically but does report being tearful at times in the past, now resolved. In the past has had suicidal ideations but not since starting back on her medications. She complains of depressed mood, fatigue, feelings of worthlessness/guilt, hopelessness and insomnia. She denies current suicidal and homicidal plan or intent but has felt that she may be better off dead in the past month. Family history significant for anxiety and depression. Possible organic causes contributing are: endocrine/metabolic. Risk factors: previous episode of depression. Previous treatment includes she is not sure if she has been on Effexor in the past and individual therapy.    She has a diagnosis of HTN and is taking her medication Losartan-HCTZ 100-25mg  as instructed with no side effects, worsening swelling of LE, chest pain, SOB. Asthma is well controled on prn albuterol inhaler use and montelukast and controlling her allergies. Chronic joint pain involving lower back, hips, right greater than left knees stable on tramadol 50 mg twice a day, last refilled 90 day supply on 09/13/15. For Hypothyroidism she is currently on levothyroxine with no reported side effects or symptoms.    Past Medical History  Diagnosis Date  . Hyperlipidemia   .  Hypertension   . Asthma   . Depression   . GERD (gastroesophageal reflux disease)   . Sleep apnea     No machine  . Anginal pain ([place])     none in approx 2 yrs  . Shortness of breath dyspnea   . Headache     migraines -2x/month  . Hypothyroidism   . Chronic lower back pain   . Wears dentures     partial upper  . Vertigo   . Motion sickness     cars    Patient Active Problem List   Diagnosis Date Noted  . History of colonic polyps   . Benign neoplasm of sigmoid colon   . Frequency 04/14/2015  . Midline low back pain without sciatica 04/14/2015  . Chronic pain of multiple joints 06/14/2014  . Dysmetabolic syndrome [id number]  . Acid reflux 06/14/2014  . Extreme obesity ([place]) 06/14/2014  . Arthritis, degenerative 06/14/2014  . Morbid obesity ([place]) 06/14/2014  . Hypothyroidism due to acquired atrophy of thyroid 08/18/2013  . Hyperlipidemia 12/07/2010  . Hypertension goal BP (blood pressure) < 140/90 12/07/2010  . Familial multiple lipoprotein-type hyperlipidemia 12/07/2010  . Chest pain 11/16/2010  . CN (constipation) 11/21/2009  . Allergic rhinitis 10/13/2008  . Asthma, mild intermittent, well-controlled 09/01/2008  . Major depressive disorder, recurrent episode, in partial remission with anxious distress ([place]) 08/13/2007    Social History  Substance Use Topics  . Smoking status: Never Smoker   . Smokeless tobacco: Never Used  . Alcohol Use:  No     Current outpatient prescriptions:  .  nitroGLYCERIN (NITROSTAT) 0.4 MG SL tablet, Place under the tongue., Disp: , Rfl:  .  aspirin 81 MG EC tablet, Take 81 mg by mouth daily.  , Disp: , Rfl:  .  Aspirin-Acetaminophen (GOODYS BODY PAIN PO), Take 1 Package by mouth as needed. , Disp: , Rfl:  .  atorvastatin (LIPITOR) 20 MG tablet, TAKE ONE TABLET BY MOUTH AT BEDTIME, Disp: 90 tablet, Rfl: 2 .  Azelastine HCl 0.15 % SOLN, Place 2 sprays into both nostrils 2 (two) times daily. , Disp: , Rfl: 3 .  carvedilol (COREG) 3.125  MG tablet, Take by mouth., Disp: , Rfl:  .  diclofenac (VOLTAREN) 75 MG EC tablet, TAKE ONE TABLET BY MOUTH EVERY 12 HOURS AS NEEDED, Disp: 60 tablet, Rfl: 3 .  divalproex (DEPAKOTE) 250 MG DR tablet, Take 1 tablet (250 mg total) by mouth every evening., Disp: 90 tablet, Rfl: 3 .  docusate sodium (COLACE) 100 MG capsule, Take 100 mg by mouth 2 (two) times daily., Disp: , Rfl:  .  EQ ALLERGY RELIEF 10 MG tablet, TAKE ONE TABLET BY MOUTH ONCE DAILY, Disp: 90 tablet, Rfl: 1 .  fluticasone (FLONASE) 50 MCG/ACT nasal spray, by Nasal route., Disp: , Rfl:  .  levothyroxine (SYNTHROID, LEVOTHROID) 112 MCG tablet, Take 1 tablet (112 mcg total) by mouth daily before breakfast., Disp: 90 tablet, Rfl: 2 .  Loratadine 10 MG CAPS, Take by mouth., Disp: , Rfl:  .  losartan-hydrochlorothiazide (HYZAAR) 100-25 MG tablet, TAKE ONE TABLET BY MOUTH ONCE DAILY, Disp: 90 tablet, Rfl: 1 .  montelukast (SINGULAIR) 10 MG tablet, Take 1 tablet (10 mg total) by mouth at bedtime., Disp: 90 tablet, Rfl: 3 .  omeprazole (PRILOSEC) 40 MG capsule, Take 1 capsule (40 mg total) by mouth daily., Disp: 90 capsule, Rfl: 1 .  PARoxetine (PAXIL) 40 MG tablet, Take 1 tablet (40 mg total) by mouth every morning., Disp: 90 tablet, Rfl: 3 .  Sod Picosulfate-Mag Ox-Cit Acd 10-3.5-12 MG-GM-GM PACK, Take 1 Container by mouth as directed., Disp: 1 each, Rfl: 0 .  traMADol (ULTRAM) 50 MG tablet, Take 1 tablet (50 mg total) by mouth every 12 (twelve) hours as needed., Disp: 180 tablet, Rfl: 0  Past Surgical History  Procedure Laterality Date  . Cesarean section    . Abdominal hysterectomy    . Colonoscopy with propofol N/A 05/29/2015    Procedure: COLONOSCOPY WITH PROPOFOL;  Surgeon: Lucilla Lame, MD;  Location: [facility];  Service: Endoscopy;  Laterality: N/A;  Latex allergy sleep apnea - no CPAP machine (yet)  . Polypectomy  05/29/2015    Procedure: POLYPECTOMY;  Surgeon: Lucilla Lame, MD;  Location: [facility];  Service:  Endoscopy;;    Family History  Problem Relation Age of Onset  . Diabetes Sister   . Hypertension Sister   . Cirrhosis Mother   . Cirrhosis Father   . Breast cancer Sister   . Heart attack Brother     Allergies  Allergen Reactions  . Latex     Pt unsure of reaction pt states something happened while in the hospital after surgery.     Review of Systems  CONSTITUTIONAL: No significant weight changes, fever, chills, weakness or fatigue.  SKIN: No rash or itching.  CARDIOVASCULAR: No chest pain, chest pressure or chest discomfort. No palpitations or edema.  RESPIRATORY: No shortness of breath, cough or sputum.  GASTROINTESTINAL: No anorexia, nausea, vomiting. No changes in  bowel habits. No abdominal pain or blood.  GENITOURINARY: No dysuria. No frequency. No discharge.  NEUROLOGICAL: No headache, dizziness, syncope, paralysis, ataxia, numbness or tingling in the extremities. No memory changes. No change in bowel or bladder control.  MUSCULOSKELETAL: Chronic joint pain. No muscle pain. HEMATOLOGIC: No anemia, bleeding or bruising.  LYMPHATICS: No enlarged lymph nodes.  PSYCHIATRIC: Stable mood. No change in sleep pattern.  ENDOCRINOLOGIC: No reports of sweating, cold or heat intolerance. No polyuria or polydipsia.     Objective  BP 122/74 mmHg  Pulse 98  Temp(Src) 98.9 F (37.2 C)  Resp 18  Ht 5\' 2"  (1.575 m)  Wt 247 lb 5 oz (112.18 kg)  BMI 45.22 kg/m2  SpO2 98% Body mass index is 45.22 kg/(m^2).  Physical Exam  Constitutional: Patient is obese and well-nourished. In no distress.  Neck: Normal range of motion. Neck supple. No JVD present. No thyromegaly present.  Cardiovascular: Normal rate, regular rhythm and normal heart sounds.  No murmur heard.  Pulmonary/Chest: Effort normal and breath sounds normal. No respiratory distress. Musculoskeletal: Normal range of motion bilateral UE and LE, no joint effusions. Peripheral vascular: Bilateral LE no  edema. Neurological: CN II-XII grossly intact with no focal deficits. Alert and oriented to person, place, and time. Coordination, balance, strength, speech and gait are normal.  Skin: Skin is warm and dry. No rash noted. No erythema.  Psychiatric: Patient has a stable mood and affect. Behavior is normal in office today. Judgment and thought content normal in office today.   Assessment & Plan   1. Major depressive disorder, recurrent episode, in partial remission with anxious distress (HCC) Significantly improved, having some GERD like symptoms with Paxil use in the morning, suggested taking the medication in the evening instead giving time for her PPI to take effect. Continue Depakote as well.  2. Hypothyroidism due to acquired atrophy of thyroid Recheck levels.  - TSH - T3, free - T4, free  3. Hypertension goal BP (blood pressure) < 140/90 Well controled.   4. Hyperglycemia Increased risk for DM II.  - Hemoglobin A1c  5. Morbid obesity with BMI of 40.0-44.9, adult ([place]) Has not lost any weight despite counseling. Mood disorder med can increase weight gain. Benefits outweigh weight issues for now.

## 2015-09-23 LAB — TSH: TSH: 1.86 u[IU]/mL (ref 0.450–4.500)

## 2015-09-23 LAB — T4, FREE: FREE T4: 1.19 ng/dL (ref 0.82–1.77)

## 2015-09-23 LAB — HEMOGLOBIN A1C
Est. average glucose Bld gHb Est-mCnc: 131 mg/dL
Hgb A1c MFr Bld: 6.2 % — ABNORMAL HIGH (ref 4.8–5.6)

## 2015-09-23 LAB — T3, FREE: T3, Free: 3.3 pg/mL (ref 2.0–4.4)

## 2015-09-25 ENCOUNTER — Other Ambulatory Visit: Payer: Self-pay

## 2015-09-25 DIAGNOSIS — F3341 Major depressive disorder, recurrent, in partial remission: Secondary | ICD-10-CM

## 2015-09-25 DIAGNOSIS — M2550 Pain in unspecified joint: Principal | ICD-10-CM

## 2015-09-25 DIAGNOSIS — F418 Other specified anxiety disorders: Secondary | ICD-10-CM

## 2015-09-25 DIAGNOSIS — G8929 Other chronic pain: Secondary | ICD-10-CM

## 2015-09-25 MED ORDER — DIVALPROEX SODIUM 250 MG PO DR TAB: 250.0000 mg | DELAYED_RELEASE_TABLET | Freq: Every evening | ORAL | Status: DC

## 2015-09-25 MED ORDER — PAROXETINE HCL 40 MG PO TABS: 40.0000 mg | ORAL_TABLET | ORAL | Status: DC

## 2015-09-25 MED ORDER — DICLOFENAC SODIUM 75 MG PO TBEC: 75.0000 mg | DELAYED_RELEASE_TABLET | Freq: Two times a day (BID) | ORAL | Status: DC | PRN

## 2015-10-02 ENCOUNTER — Other Ambulatory Visit: Payer: Self-pay

## 2015-10-02 DIAGNOSIS — G8929 Other chronic pain: Secondary | ICD-10-CM

## 2015-10-02 DIAGNOSIS — F3341 Major depressive disorder, recurrent, in partial remission: Secondary | ICD-10-CM

## 2015-10-02 DIAGNOSIS — M2550 Pain in unspecified joint: Principal | ICD-10-CM

## 2015-10-02 DIAGNOSIS — F418 Other specified anxiety disorders: Secondary | ICD-10-CM

## 2015-10-02 MED ORDER — DICLOFENAC SODIUM 75 MG PO TBEC: 75.0000 mg | DELAYED_RELEASE_TABLET | Freq: Two times a day (BID) | ORAL | Status: DC | PRN

## 2015-10-02 MED ORDER — PAROXETINE HCL 40 MG PO TABS: 40.0000 mg | ORAL_TABLET | ORAL | Status: DC

## 2015-10-02 MED ORDER — DIVALPROEX SODIUM 250 MG PO DR TAB: 250.0000 mg | DELAYED_RELEASE_TABLET | Freq: Every evening | ORAL | Status: DC

## 2015-10-30 ENCOUNTER — Other Ambulatory Visit: Payer: Self-pay

## 2015-10-30 DIAGNOSIS — J4520 Mild intermittent asthma, uncomplicated: Secondary | ICD-10-CM

## 2015-10-30 DIAGNOSIS — E034 Atrophy of thyroid (acquired): Secondary | ICD-10-CM

## 2015-10-30 MED ORDER — LOSARTAN POTASSIUM-HCTZ 100-25 MG PO TABS: 1.0000 | ORAL_TABLET | Freq: Every day | ORAL | Status: DC

## 2015-10-30 MED ORDER — MONTELUKAST SODIUM 10 MG PO TABS: 10.0000 mg | ORAL_TABLET | Freq: Every day | ORAL | Status: DC

## 2015-10-30 MED ORDER — LEVOTHYROXINE SODIUM 112 MCG PO TABS: 112.0000 ug | ORAL_TABLET | Freq: Every day | ORAL | Status: DC

## 2015-10-30 NOTE — Telephone Encounter (Signed)
I think she usually sees you; PCP listed as Dr. Ancil Boozer

## 2015-11-21 ENCOUNTER — Other Ambulatory Visit: Payer: Self-pay | Admitting: Otolaryngology

## 2015-11-21 DIAGNOSIS — E041 Nontoxic single thyroid nodule: Secondary | ICD-10-CM

## 2015-11-23 ENCOUNTER — Ambulatory Visit
Admission: RE | Admit: 2015-11-23 | Discharge: 2015-11-23 | Disposition: A | Discharge status: Home / Self Care | Payer: BLUE CROSS/BLUE SHIELD | Source: Ambulatory Visit | Attending: Otolaryngology | Admitting: Otolaryngology

## 2015-11-23 DIAGNOSIS — E041 Nontoxic single thyroid nodule: Secondary | ICD-10-CM

## 2015-12-20 ENCOUNTER — Encounter: Payer: Self-pay | Admitting: Family Medicine

## 2015-12-20 ENCOUNTER — Ambulatory Visit (INDEPENDENT_AMBULATORY_CARE_PROVIDER_SITE_OTHER): Payer: BLUE CROSS/BLUE SHIELD | Admitting: Family Medicine

## 2015-12-20 VITALS — BP 148/98 | HR 97 | Temp 98.1°F | Resp 16 | Ht 62.0 in | Wt 251.5 lb

## 2015-12-20 DIAGNOSIS — I10 Essential (primary) hypertension: Secondary | ICD-10-CM | POA: Diagnosis not present

## 2015-12-20 DIAGNOSIS — E8881 Metabolic syndrome: Secondary | ICD-10-CM | POA: Diagnosis not present

## 2015-12-20 DIAGNOSIS — IMO0001 Reserved for inherently not codable concepts without codable children: Secondary | ICD-10-CM

## 2015-12-20 DIAGNOSIS — E785 Hyperlipidemia, unspecified: Secondary | ICD-10-CM

## 2015-12-20 DIAGNOSIS — I2089 Other forms of angina pectoris: Secondary | ICD-10-CM | POA: Insufficient documentation

## 2015-12-20 DIAGNOSIS — I208 Other forms of angina pectoris: Secondary | ICD-10-CM | POA: Insufficient documentation

## 2015-12-20 DIAGNOSIS — E038 Other specified hypothyroidism: Secondary | ICD-10-CM

## 2015-12-20 DIAGNOSIS — E034 Atrophy of thyroid (acquired): Secondary | ICD-10-CM

## 2015-12-20 DIAGNOSIS — G4733 Obstructive sleep apnea (adult) (pediatric): Secondary | ICD-10-CM | POA: Diagnosis not present

## 2015-12-20 DIAGNOSIS — M2550 Pain in unspecified joint: Secondary | ICD-10-CM | POA: Diagnosis not present

## 2015-12-20 DIAGNOSIS — Z9989 Dependence on other enabling machines and devices: Secondary | ICD-10-CM | POA: Insufficient documentation

## 2015-12-20 DIAGNOSIS — F331 Major depressive disorder, recurrent, moderate: Secondary | ICD-10-CM

## 2015-12-20 DIAGNOSIS — F411 Generalized anxiety disorder: Secondary | ICD-10-CM | POA: Insufficient documentation

## 2015-12-20 DIAGNOSIS — R350 Frequency of micturition: Secondary | ICD-10-CM | POA: Diagnosis not present

## 2015-12-20 DIAGNOSIS — Z6841 Body Mass Index (BMI) 40.0 and over, adult: Secondary | ICD-10-CM

## 2015-12-20 DIAGNOSIS — J4520 Mild intermittent asthma, uncomplicated: Secondary | ICD-10-CM

## 2015-12-20 DIAGNOSIS — G8929 Other chronic pain: Secondary | ICD-10-CM

## 2015-12-20 MED ORDER — BUSPIRONE HCL 5 MG PO TABS: 5.0000 mg | ORAL_TABLET | Freq: Two times a day (BID) | ORAL | Status: DC

## 2015-12-20 MED ORDER — MONTELUKAST SODIUM 10 MG PO TABS: 10.0000 mg | ORAL_TABLET | Freq: Every day | ORAL | Status: DC

## 2015-12-20 MED ORDER — AMLODIPINE-VALSARTAN-HCTZ 5-160-25 MG PO TABS: 1.0000 | ORAL_TABLET | Freq: Every day | ORAL | Status: DC

## 2015-12-20 MED ORDER — ATORVASTATIN CALCIUM 20 MG PO TABS
20.0000 mg | ORAL_TABLET | Freq: Every day | ORAL | Status: DC
Start: 2015-12-20 — End: 2015-12-24

## 2015-12-20 NOTE — Progress Notes (Signed)
Name: Beverly Barrett   MRN: [id number]    DOB: [birth year]   Date:12/20/2015       Progress Note  Subjective  Chief Complaint  Chief Complaint  Patient presents with  . Medication Refill    3 month F/U  . Hypertension    Patient states she is having SOB, Edema in bilateral ankles and headaches. Blood pressure at home runs 150/100 and feels tired and fatigue  . Depression    Unchanged  . Hypothyroidism    Dry Skin, Constipation, Heat Intolerance  . Allergic Rhinitis     Worst with pollen, sneezing, itchy, red itchy eyes  . Gastroesophageal Reflux    Past 2 weeks she has been very nausea, having burn sensation since decreasing dose from 2 tablets daily to once    HPI   Major Depression/GAD:  She has a long history of depression and has recently (2016) been placed on Paroxetine 40 mg in the morning and Depakote 250 mg DR one tablet in the evenings, she states Depakote does not help with sleep or mood. She states she still cries daily, and occasionally has suicidal ideation but no planning ( she states she would not do it because of her grandson ). She complains of depressed mood, fatigue, feelings of worthlessness/guilt, hopelessness ( she tried going back to work but could not do it - felt like she could not learn ) and insomnia. Family history significant for anxiety and depression. She has severe anxiety, difficulty interacting with others, she picks on her nails and makes herself bleed at times. Discussed psychiatric evaluation but she would like to try Buspar first, we will stop Depakote at this time   HTN: she  taking her medication Losartan-HCTZ 100-25mg  as instructed with no side effects, she states her legs were a little swollen around ankles yesterday but resolved now. She has intermittent chest pain that radiates to her back, may last to her back and improves with rest ( she has angina seen by cardiologist in the past ) and only has SOB during activity - like when she walks  with daughter to exercise.   Angina: Angina, seen by [facility] years ago, having substernal pressure like  chest pain, radiates to her back, lasts about 15 minutes about 2 times monthly.   Asthma:  well controled on prn albuterol inhaler use and montelukast. No cough or wheezing, she has some SOB with activity and advised to use Proair prior to activity  Chronic joint pain involving lower back, hips, right greater than left knees. She is taking Diclofenac and Tramadol, but states it does not help with pain. Advised to stop Tramadol and try Tylenol instead, needs to stop Goody Powders - discussed risk of bleeding and also kidney disease  Hypothyroidism she is currently on levothyroxine, she has chronic dry skin and constipation  OSA: using CPAP every night , all night, but still feels tired during the day. We will consider Nuvigil once anxiety is better controlled  Obesity: she gained more weight since last visit, trying to be active, and is trying to eat healthier, she is not sure why she is gaining weight at this time  GERD: getting worse, taking Goody powders and Diclofenac, advised to stop, take PPI, Tums prn    Patient Active Problem List   Diagnosis Date Noted  . GAD (generalized anxiety disorder) 12/20/2015  . Hyperglycemia 09/22/2015  . History of colonic polyps   . Benign neoplasm of sigmoid colon   . Frequency 04/14/2015  .  Midline low back pain without sciatica 04/14/2015  . Chronic pain of multiple joints 06/14/2014  . Dysmetabolic syndrome [id number]  . Acid reflux 06/14/2014  . Extreme obesity ([place]) 06/14/2014  . Arthritis, degenerative 06/14/2014  . Morbid obesity with BMI of 40.0-44.9, adult ([place]) 06/14/2014  . Hypothyroidism due to acquired atrophy of thyroid 08/18/2013  . Hyperlipidemia 12/07/2010  . Hypertension goal BP (blood pressure) < 140/90 12/07/2010  . Familial multiple lipoprotein-type hyperlipidemia 12/07/2010  . Chest pain 11/16/2010  . CN (constipation)  11/21/2009  . Allergic rhinitis 10/13/2008  . Asthma, mild intermittent, well-controlled 09/01/2008  . Major depressive disorder, recurrent episode, in partial remission with anxious distress ([place]) 08/13/2007    Past Surgical History  Procedure Laterality Date  . Cesarean section    . Abdominal hysterectomy    . Colonoscopy with propofol N/A 05/29/2015    Procedure: COLONOSCOPY WITH PROPOFOL;  Surgeon: Lucilla Lame, MD;  Location: [facility];  Service: Endoscopy;  Laterality: N/A;  Latex allergy sleep apnea - no CPAP machine (yet)  . Polypectomy  05/29/2015    Procedure: POLYPECTOMY;  Surgeon: Lucilla Lame, MD;  Location: [facility];  Service: Endoscopy;;    Family History  Problem Relation Age of Onset  . Diabetes Sister   . Hypertension Sister   . Cirrhosis Mother   . Cirrhosis Father   . Breast cancer Sister   . Heart attack Brother     Social History   Social History  . Marital Status: Married    Spouse Name: N/A  . Number of Children: N/A  . Years of Education: N/A   Occupational History  . Not on file.   Social History Main Topics  . Smoking status: Never Smoker   . Smokeless tobacco: Never Used  . Alcohol Use: No  . Drug Use: No  . Sexual Activity:    Partners: Male   Other Topics Concern  . Not on file   Social History Narrative     Current outpatient prescriptions:  .  aspirin 81 MG EC tablet, Take 81 mg by mouth daily.  , Disp: , Rfl:  .  atorvastatin (LIPITOR) 20 MG tablet, Take 1 tablet (20 mg total) by mouth at bedtime., Disp: 90 tablet, Rfl: 1 .  Azelastine HCl 0.15 % SOLN, Place 2 sprays into both nostrils 2 (two) times daily. , Disp: , Rfl: 3 .  diclofenac (VOLTAREN) 75 MG EC tablet, Take 1 tablet (75 mg total) by mouth every 12 (twelve) hours as needed., Disp: 180 tablet, Rfl: 0 .  docusate sodium (COLACE) 100 MG capsule, Take 100 mg by mouth 2 (two) times daily., Disp: , Rfl:  .  EQ ALLERGY RELIEF 10 MG tablet, TAKE ONE  TABLET BY MOUTH ONCE DAILY, Disp: 90 tablet, Rfl: 1 .  fluticasone (FLONASE) 50 MCG/ACT nasal spray, by Nasal route., Disp: , Rfl:  .  levothyroxine (SYNTHROID, LEVOTHROID) 112 MCG tablet, Take 1 tablet (112 mcg total) by mouth daily before breakfast., Disp: 90 tablet, Rfl: 0 .  Loratadine 10 MG CAPS, Take by mouth., Disp: , Rfl:  .  montelukast (SINGULAIR) 10 MG tablet, Take 1 tablet (10 mg total) by mouth at bedtime., Disp: 90 tablet, Rfl: 0 .  nitroGLYCERIN (NITROSTAT) 0.4 MG SL tablet, Place under the tongue., Disp: , Rfl:  .  omeprazole (PRILOSEC) 40 MG capsule, Take 1 capsule (40 mg total) by mouth daily., Disp: 90 capsule, Rfl: 1 .  PARoxetine (PAXIL) 40 MG tablet, Take 1 tablet (40  mg total) by mouth every morning., Disp: 90 tablet, Rfl: 1 .  Sod Picosulfate-Mag Ox-Cit Acd 10-3.5-12 MG-GM-GM PACK, Take 1 Container by mouth as directed., Disp: 1 each, Rfl: 0 .  Amlodipine-Valsartan-HCTZ 5-160-25 MG TABS, Take 1 tablet by mouth daily., Disp: 90 tablet, Rfl: 0 .  busPIRone (BUSPAR) 5 MG tablet, Take 1 tablet (5 mg total) by mouth 2 (two) times daily., Disp: 60 tablet, Rfl: 0  Allergies  Allergen Reactions  . Latex     Pt unsure of reaction pt states something happened while in the hospital after surgery.     ROS  Constitutional: Negative for fever, positive for weight change.  Respiratory: Negative for cough , positive for shortness of breath.   Cardiovascular: Positive  for chest pain, but no  palpitations.  Gastrointestinal: Negative for abdominal pain, no bowel changes.  Musculoskeletal: Negative for gait problem or joint swelling.  Skin: Negative for rash.  Neurological: Negative for dizziness , positive for  headache.  No other specific complaints in a complete review of systems (except as listed in HPI above).  Objective  Filed Vitals:   12/20/15 0907  BP: 148/98  Pulse: 97  Temp: 98.1 F (36.7 C)  TempSrc: Oral  Resp: 16  Height: 5\' 2"  (1.575 m)  Weight: 251 lb 8  oz (114.08 kg)  SpO2: 97%    Body mass index is 45.99 kg/(m^2).  Physical Exam  Constitutional: Patient appears well-developed and well-nourished. Obese  HEENT: head atraumatic, normocephalic, pupils equal and reactive to light,  neck supple, throat within normal limits Cardiovascular: Normal rate, regular rhythm and normal heart sounds.  No murmur heard. No BLE edema. Pulmonary/Chest: Effort normal and breath sounds normal. No respiratory distress. Abdominal: Soft.  There is no tenderness. Psychiatric: Patient is anxious, constantly shaking her legs and also hands. behavior is normal. Judgment and thought content normal. Muscular Skeletal: pain during palpation of lumbar spine, crepitus with extension of right knee, no effusion  Recent Results (from the past 2160 hour(s))  TSH     Status: None   Collection Time: 09/22/15  9:52 AM  Result Value Ref Range   TSH 1.860 0.450 - 4.500 uIU/mL  T3, free     Status: None   Collection Time: 09/22/15  9:52 AM  Result Value Ref Range   T3, Free 3.3 2.0 - 4.4 pg/mL  T4, free     Status: None   Collection Time: 09/22/15  9:52 AM  Result Value Ref Range   Free T4 1.19 0.82 - 1.77 ng/dL  Hemoglobin A1c     Status: Abnormal   Collection Time: 09/22/15  9:52 AM  Result Value Ref Range   Hgb A1c MFr Bld 6.2 (H) 4.8 - 5.6 %    Comment:          Pre-diabetes: 5.7 - 6.4          Diabetes: >6.4          Glycemic control for adults with diabetes: <7.0    Est. average glucose Bld gHb Est-mCnc 131 mg/dL     PHQ2/9: Depression screen [facility] 2/9 09/22/2015 06/16/2015 04/14/2015  Decreased Interest 1 0 1  Down, Depressed, Hopeless 1 1 3   PHQ - 2 Score 2 1 4   Altered sleeping 0 - 3  Tired, decreased energy 1 - 2  Change in appetite 0 - 2  Feeling bad or failure about yourself  1 - 2  Trouble concentrating 0 - 2  Moving slowly or fidgety/restless  0 - 1  Suicidal thoughts 1 - 1  PHQ-9 Score 5 - 17  Difficult doing work/chores - - Very difficult      Fall Risk: Fall Risk  09/22/2015 06/16/2015 04/14/2015  Falls in the past year? No No Yes  Number falls in past yr: - - 1  Injury with Fall? - - Yes    No flowsheet data found.    GAD 7 : Generalized Anxiety Score 12/20/2015  Nervous, Anxious, on Edge 1  Control/stop worrying 2  Worry too much - different things 3  Trouble relaxing 3  Restless 3  Easily annoyed or irritable 3  Afraid - awful might happen 3  Total GAD 7 Score 18  Anxiety Difficulty Very difficult      Assessment & Plan  1. Depression, major, recurrent, moderate ([place])  Discussed suicide hotline, stopping Depakote since it is not helping - busPIRone (BUSPAR) 5 MG tablet; Take 1 tablet (5 mg total) by mouth 2 (two) times daily.  Dispense: 60 tablet; Refill: 0  2. Hypothyroidism due to acquired atrophy of thyroid  - TSH  3. Hypertension goal BP (blood pressure) < 140/90  Discussed adding Carvedilol but she did not like taking it we will try combination therapy  - Amlodipine-Valsartan-HCTZ 5-160-25 MG TABS; Take 1 tablet by mouth daily.  Dispense: 90 tablet; Refill: 0 - Comprehensive metabolic panel  4. Morbid obesity with BMI of 40.0-44.9, adult [facility])  Discussed with the patient the risk posed by an increased BMI. Discussed importance of portion control, calorie counting and at least 150 minutes of physical activity weekly. Avoid sweet beverages and drink more water. Eat at least 6 servings of fruit and vegetables daily   5. GAD (generalized anxiety disorder)  Very anxious, we will add Buspar and return in 3-4 weeks - busPIRone (BUSPAR) 5 MG tablet; Take 1 tablet (5 mg total) by mouth 2 (two) times daily.  Dispense: 60 tablet; Refill: 0  6. Asthma, mild intermittent, well-controlled  - montelukast (SINGULAIR) 10 MG tablet; Take 1 tablet (10 mg total) by mouth at bedtime.  Dispense: 90 tablet; Refill: 0  7. Hyperlipidemia  - Lipid panel  8. Dysmetabolic syndrome  - Hemoglobin A1c  9.  Frequency  - Urine culture - it negative we will start medication   10. Chronic pain of multiple joints  Stop Tramadol since it is not working, may take Tylenol otc prn   11. Hyperlipidemia with target LDL less than 100  - atorvastatin (LIPITOR) 20 MG tablet; Take 1 tablet (20 mg total) by mouth at bedtime.  Dispense: 90 tablet; Refill: 1  12. OSA on CPAP  Continue CPAP use  13. Angina effort [facility])  - Ambulatory referral to Cardiology She has NTG at home, go to [facility] if severe symptoms or no resolution in 15 minutes

## 2015-12-21 LAB — COMPREHENSIVE METABOLIC PANEL
A/G RATIO: 1.5 (ref 1.2–2.2)
ALBUMIN: 4.3 g/dL (ref 3.5–5.5)
ALT: 18 IU/L (ref 0–32)
AST: 19 IU/L (ref 0–40)
Alkaline Phosphatase: 61 IU/L (ref 39–117)
BILIRUBIN TOTAL: 0.2 mg/dL (ref 0.0–1.2)
BUN / CREAT RATIO: 23 (ref 9–23)
BUN: 17 mg/dL (ref 6–24)
CHLORIDE: 98 mmol/L (ref 96–106)
CO2: 25 mmol/L (ref 18–29)
Calcium: 9.4 mg/dL (ref 8.7–10.2)
Creatinine, Ser: 0.73 mg/dL (ref 0.57–1.00)
GFR calc non Af Amer: 92 mL/min/{1.73_m2} (ref >59)
GFR, EST AFRICAN AMERICAN: 106 mL/min/{1.73_m2} (ref >59)
Globulin, Total: 2.9 g/dL (ref 1.5–4.5)
Glucose: 105 mg/dL — ABNORMAL HIGH (ref 65–99)
POTASSIUM: 3.8 mmol/L (ref 3.5–5.2)
Sodium: 144 mmol/L (ref 134–144)
TOTAL PROTEIN: 7.2 g/dL (ref 6.0–8.5)

## 2015-12-21 LAB — URINE CULTURE

## 2015-12-21 LAB — LIPID PANEL
CHOLESTEROL TOTAL: 192 mg/dL (ref 100–199)
Chol/HDL Ratio: 4.2 ratio units (ref 0.0–4.4)
HDL: 46 mg/dL (ref >39)
LDL Calculated: 103 mg/dL — ABNORMAL HIGH (ref 0–99)
TRIGLYCERIDES: 215 mg/dL — AB (ref 0–149)
VLDL Cholesterol Cal: 43 mg/dL — ABNORMAL HIGH (ref 5–40)

## 2015-12-21 LAB — TSH: TSH: 1.17 u[IU]/mL (ref 0.450–4.500)

## 2015-12-21 LAB — HEMOGLOBIN A1C
Est. average glucose Bld gHb Est-mCnc: 134 mg/dL
Hgb A1c MFr Bld: 6.3 % — ABNORMAL HIGH (ref 4.8–5.6)

## 2015-12-24 ENCOUNTER — Other Ambulatory Visit: Payer: Self-pay | Admitting: Family Medicine

## 2015-12-24 DIAGNOSIS — E785 Hyperlipidemia, unspecified: Secondary | ICD-10-CM

## 2015-12-24 MED ORDER — ATORVASTATIN CALCIUM 40 MG PO TABS: 40.0000 mg | ORAL_TABLET | Freq: Every day | ORAL | Status: DC

## 2016-01-11 ENCOUNTER — Ambulatory Visit (INDEPENDENT_AMBULATORY_CARE_PROVIDER_SITE_OTHER): Payer: BLUE CROSS/BLUE SHIELD | Admitting: Cardiology

## 2016-01-11 ENCOUNTER — Encounter: Payer: Self-pay | Admitting: Cardiology

## 2016-01-11 VITALS — BP 140/80 | HR 92 | Ht 64.0 in | Wt 253.8 lb

## 2016-01-11 DIAGNOSIS — R0602 Shortness of breath: Secondary | ICD-10-CM | POA: Diagnosis not present

## 2016-01-11 DIAGNOSIS — E669 Obesity, unspecified: Secondary | ICD-10-CM

## 2016-01-11 DIAGNOSIS — R079 Chest pain, unspecified: Secondary | ICD-10-CM | POA: Diagnosis not present

## 2016-01-11 DIAGNOSIS — E785 Hyperlipidemia, unspecified: Secondary | ICD-10-CM

## 2016-01-11 DIAGNOSIS — R0789 Other chest pain: Secondary | ICD-10-CM | POA: Diagnosis not present

## 2016-01-11 DIAGNOSIS — I10 Essential (primary) hypertension: Secondary | ICD-10-CM | POA: Diagnosis not present

## 2016-01-11 MED ORDER — NITROGLYCERIN 0.4 MG SL SUBL: 0.4000 mg | SUBLINGUAL_TABLET | SUBLINGUAL | Status: DC | PRN

## 2016-01-11 NOTE — Progress Notes (Signed)
Cardiology Office Note   Date:  01/11/2016   ID:  Beverly Barrett, DOB [birth year], MRN [id number]  Referring Doctor:  Loistine Chance, MD   Cardiologist:   Wende Bushy, MD   Reason for consultation:  Chief Complaint  Patient presents with  . Establish Care    CP      History of Present Illness: Beverly Barrett is a 55 y.o. female who presents for Chest pain. This has been going on for about a month now. She describes the pain as sharp pain in the center of the chest, sometimes radiating to the back and sometimes radiating down to the left arm. It can get as intense as 7 out of 10 in severity. The duration is a few minutes at a time. Sometimes occurring with exertion but sometimes occurring randomly. There is associated shortness of breath with exertion as well.  She had been evaluated in the past for chest pain. She recalls that previous chest pain was more related to reflux. This time around, her symptoms are of different quality.  Patient denies fever, cough, colds, abdominal pain. No PND, orthopnea or edema. No palpitations. No loss of consciousness.   ROS:  Please see the history of present illness. Aside from mentioned under HPI, all other systems are reviewed and negative.     Past Medical History  Diagnosis Date  . Hyperlipidemia   . Hypertension   . Asthma   . Depression   . GERD (gastroesophageal reflux disease)   . Sleep apnea     No machine  . Anginal pain ([place])     none in approx 2 yrs  . Shortness of breath dyspnea   . Headache     migraines -2x/month  . Hypothyroidism   . Chronic lower back pain   . Wears dentures     partial upper  . Vertigo   . Motion sickness     cars    Past Surgical History  Procedure Laterality Date  . Cesarean section    . Abdominal hysterectomy    . Colonoscopy with propofol N/A 05/29/2015    Procedure: COLONOSCOPY WITH PROPOFOL;  Surgeon: Lucilla Lame, MD;  Location: [place];  Service: Endoscopy;   Laterality: N/A;  Latex allergy sleep apnea - no CPAP machine (yet)  . Polypectomy  05/29/2015    Procedure: POLYPECTOMY;  Surgeon: Lucilla Lame, MD;  Location: [place];  Service: Endoscopy;;     reports that she has never smoked. She has never used smokeless tobacco. She reports that she does not drink alcohol or use illicit drugs.   family history includes Breast cancer in her sister; Cirrhosis in her father and mother; Diabetes in her sister; Heart attack in her brother; Hypertension in her sister.   Current Outpatient Prescriptions  Medication Sig Dispense Refill  . Amlodipine-Valsartan-HCTZ 5-160-25 MG TABS Take 1 tablet by mouth daily. 90 tablet 0  . aspirin 81 MG EC tablet Take 81 mg by mouth daily.      Marland Kitchen atorvastatin (LIPITOR) 40 MG tablet Take 1 tablet (40 mg total) by mouth at bedtime. 90 tablet 1  . Azelastine HCl 0.15 % SOLN Place 2 sprays into both nostrils 2 (two) times daily.   3  . benzonatate (TESSALON) 100 MG capsule Take 100 mg by mouth daily.  1  . busPIRone (BUSPAR) 5 MG tablet Take 1 tablet (5 mg total) by mouth 2 (two) times daily. 60 tablet 0  . diclofenac (VOLTAREN) 75 MG  EC tablet Take 1 tablet (75 mg total) by mouth every 12 (twelve) hours as needed. 180 tablet 0  . docusate sodium (COLACE) 100 MG capsule Take 100 mg by mouth 2 (two) times daily.    [name] ALLERGY RELIEF 10 MG tablet TAKE ONE TABLET BY MOUTH ONCE DAILY 90 tablet 1  . fluticasone (FLONASE) 50 MCG/ACT nasal spray by Nasal route.    Marland Kitchen levothyroxine (SYNTHROID, LEVOTHROID) 112 MCG tablet Take 1 tablet (112 mcg total) by mouth daily before breakfast. 90 tablet 0  . montelukast (SINGULAIR) 10 MG tablet Take 1 tablet (10 mg total) by mouth at bedtime. 90 tablet 0  . omeprazole (PRILOSEC) 40 MG capsule Take 1 capsule (40 mg total) by mouth daily. 90 capsule 1  . PARoxetine (PAXIL) 40 MG tablet Take 1 tablet (40 mg total) by mouth every morning. 90 tablet 1   No current facility-administered  medications for this visit.    Allergies: Latex    PHYSICAL EXAM: VS:  BP 140/80 mmHg  Pulse 92  Ht 5\' 4"  (1.626 m)  Wt 253 lb 12.8 oz (115.123 kg)  BMI 43.54 kg/m2  SpO2 91% , Body mass index is 43.54 kg/(m^2). Wt Readings from Last 3 Encounters:  01/11/16 253 lb 12.8 oz (115.123 kg)  12/20/15 251 lb 8 oz (114.08 kg)  09/22/15 247 lb 5 oz (112.18 kg)    GENERAL:  well developed, well nourished, obese, not in acute distress HEENT: normocephalic, pink conjunctivae, anicteric sclerae, no xanthelasma, normal dentition, oropharynx clear NECK:  no neck vein engorgement, JVP normal, no hepatojugular reflux, carotid upstroke brisk and symmetric, no bruit, no thyromegaly, no lymphadenopathy LUNGS:  good respiratory effort, clear to auscultation bilaterally CV:  PMI not displaced, no thrills, no lifts, S1 and S2 within normal limits, no palpable S3 or S4, no murmurs, no rubs, no gallops ABD:  Soft, nontender, nondistended, normoactive bowel sounds, no abdominal aortic bruit, no hepatomegaly, no splenomegaly MS: nontender back, no kyphosis, no scoliosis, no joint deformities EXT:  2+ DP/PT pulses, no edema, no varicosities, no cyanosis, no clubbing SKIN: warm, nondiaphoretic, normal turgor, no ulcers NEUROPSYCH: alert, oriented to person, place, and time, sensory/motor grossly intact, normal mood, appropriate affect  Recent Labs: 12/20/2015: ALT 18; BUN 17; Creatinine, Ser 0.73; Potassium 3.8; Sodium 144; TSH 1.170   Lipid Panel    Component Value Date/Time   CHOL 192 12/20/2015 1012   TRIG 215* 12/20/2015 1012   HDL 46 12/20/2015 1012   CHOLHDL 4.2 12/20/2015 1012   LDLCALC 103* 12/20/2015 1012     Other studies Reviewed:  EKG:  The ekg from 01/11/2016 was personally reviewed by me and it revealed sinus rhythm, 84 BPM. Nonspecific ST-T wave changes.  Additional studies/ records that were reviewed personally reviewed by me today include: None available   ASSESSMENT AND  PLAN:  Chest pain Shortness of breath Risk factors for CAD include hypertension, hyperlipidemia, obesity, family history. She had a stress test done within 5 years ago. She recalls that the chest pain at the time was more of reflux. Her chest pain currently is of a different quality. She is unable to walk on the treadmill for significant amount of time. Recommend pharmacologic nuclear stress test. Recommend echocardiogram. Patient to continue ASA, and prescribed NTG SL prn for chest pain. Patient instructed to call 911 for unrelenting chest pain.  Hypertension BP is well controlled. Continue monitoring BP. Continue current medical therapy and lifestyle changes.  Hyperlipidemia Patient on statin therapy, PCP  following labs.  Obesity Body mass index is 43.54 kg/(m^2).Marland Kitchen Recommend aggressive weight loss through diet and increased physical activity. Once cardiac workup is done.     Current medicines are reviewed at length with the patient today.  The patient does not have concerns regarding medicines.  Labs/ tests ordered today include:  Orders Placed This Encounter  Procedures  . EKG 12-Lead    I had a lengthy and detailed discussion with the patient regarding diagnoses, prognosis, diagnostic options, treatment options , and side effects of medications.   I counseled the patient on importance of lifestyle modification including heart healthy diet, regular physical activity .  Disposition:   FU with undersigned after tests    Signed, Wende Bushy, MD  01/11/2016 2:40 PM    [facility]

## 2016-01-11 NOTE — Patient Instructions (Addendum)
Medication Instructions:  Your physician has recommended you make the following change in your medication:  1. Nitroglycerin 0.4 mg under the tongue for chest pain. One tablet every 5 minutes up to a total of 3. Please read information below before taking first dose.    Labwork: None ordered   Testing/Procedures: Your physician has requested that you have an echocardiogram. Echocardiography is a painless test that uses sound waves to create images of your heart. It provides your doctor with information about the size and shape of your heart and how well your heart's chambers and valves are working. This procedure takes approximately one hour. There are no restrictions for this procedure.  Date & Time: _______________________________________________________  [facility]  Your caregiver has ordered a Stress Test with nuclear imaging. The purpose of this test is to evaluate the blood supply to your heart muscle. This procedure is referred to as a "Non-Invasive Stress Test." This is because other than having an IV started in your vein, nothing is inserted or "invades" your body. Cardiac stress tests are done to find areas of poor blood flow to the heart by determining the extent of coronary artery disease (CAD). Some patients exercise on a treadmill, which naturally increases the blood flow to your heart, while others who are  unable to walk on a treadmill due to physical limitations have a pharmacologic/chemical stress agent called Lexiscan . This medicine will mimic walking on a treadmill by temporarily increasing your coronary blood flow.   Please note: these test may take anywhere between 2-4 hours to complete  PLEASE REPORT TO [place] AT THE FIRST DESK WILL DIRECT YOU WHERE TO GO  Date of Procedure:__Tuesday January 23, 2016 at 08:00AM___  Arrival Time for Procedure:_Arrive at 07:45AM to register___   PLEASE NOTIFY THE OFFICE AT LEAST 24 HOURS IN ADVANCE  IF YOU ARE UNABLE TO [place].  [phone] AND  PLEASE NOTIFY NUCLEAR MEDICINE AT [facility] AT LEAST 24 HOURS IN ADVANCE IF YOU ARE UNABLE TO KEEP YOUR APPOINTMENT. [phone]  How to prepare for your Myoview test:   Do not eat or drink after midnight  No caffeine for 24 hours prior to test  No smoking 24 hours prior to test.  Your medication may be taken with water.  If your doctor stopped a medication because of this test, do not take that medication.  Ladies, please do not wear dresses.  Skirts or pants are appropriate. Please wear a short sleeve shirt.  No perfume, cologne or lotion.  Wear comfortable walking shoes. No heels!   Follow-Up: Your physician recommends that you schedule a follow-up appointment after testing to review results.  Date & Time: _______________________________________________________  It was a pleasure seeing you today here in the office. Please do not hesitate to give [place] a call back if you have any further questions. [place], BSN      Any Other Special Instructions Will Be Listed Below (If Applicable).     If you need a refill on your cardiac medications before your next appointment, please call your pharmacy.  Pharmacologic Stress Electrocardiogram A pharmacologic stress electrocardiogram is a heart (cardiac) test that uses nuclear imaging to evaluate the blood supply to your heart. This test may also be called a pharmacologic stress electrocardiography. Pharmacologic means that a medicine is used to increase your heart rate and blood pressure.  This stress test is done to find areas of poor blood flow to  the heart by determining the extent of coronary artery disease (CAD). Some people exercise on a treadmill, which naturally increases the blood flow to the heart. For those people unable to exercise on a treadmill, a medicine is used. This medicine stimulates your heart and will cause your heart to beat harder and  more quickly, as if you were exercising.  Pharmacologic stress tests can help determine:  The adequacy of blood flow to your heart during increased levels of activity in order to clear you for discharge home.  The extent of coronary artery blockage caused by CAD.  Your prognosis if you have suffered a heart attack.  The effectiveness of cardiac procedures done, such as an angioplasty, which can increase the circulation in your coronary arteries.  Causes of chest pain or pressure. LET [facility] CARE PROVIDER KNOW ABOUT:  Any allergies you have.  All medicines you are taking, including vitamins, herbs, eye drops, creams, and over-the-counter medicines.  Previous problems you or members of your family have had with the use of anesthetics.  Any blood disorders you have.  Previous surgeries you have had.  Medical conditions you have.  Possibility of pregnancy, if this applies.  If you are currently breastfeeding. RISKS AND COMPLICATIONS Generally, this is a safe procedure. However, as with any procedure, complications can occur. Possible complications include:  You develop pain or pressure in the following areas:  Chest.  Jaw or neck.  Between your shoulder blades.  Radiating down your left arm.  Headache.  Dizziness or light-headedness.  Shortness of breath.  Increased or irregular heartbeat.  Low blood pressure.  Nausea or vomiting.  Flushing.  Redness going up the arm and slight pain during injection of medicine.  Heart attack (rare). BEFORE THE PROCEDURE   Avoid all forms of caffeine for 24 hours before your test or as directed by your health care provider. This includes coffee, tea (even decaffeinated tea), caffeinated sodas, chocolate, cocoa, and certain pain medicines.  Follow your health care provider's instructions regarding eating and drinking before the test.  Take your medicines as directed at regular times with water unless instructed  otherwise. Exceptions may include:  If you have diabetes, ask how you are to take your insulin or pills. It is common to adjust insulin dosing the morning of the test.  If you are taking beta-blocker medicines, it is important to talk to your health care provider about these medicines well before the date of your test. Taking beta-blocker medicines may interfere with the test. In some cases, these medicines need to be changed or stopped 24 hours or more before the test.  If you wear a nitroglycerin patch, it may need to be removed prior to the test. Ask your health care provider if the patch should be removed before the test.  If you use an inhaler for any breathing condition, bring it with you to the test.  If you are an outpatient, bring a snack so you can eat right after the stress phase of the test.  Do not smoke for 4 hours prior to the test or as directed by your health care provider.  Do not apply lotions, powders, creams, or oils on your chest prior to the test.  Wear comfortable shoes and clothing. Let your health care provider know if you were unable to complete or follow the preparations for your test. PROCEDURE   Multiple patches (electrodes) will be put on your chest. If needed, small areas of your chest may be  shaved to get better contact with the electrodes. Once the electrodes are attached to your body, multiple wires will be attached to the electrodes, and your heart rate will be monitored.  An IV access will be started. A nuclear trace (isotope) is given. The isotope may be given intravenously, or it may be swallowed. Nuclear refers to several types of radioactive isotopes, and the nuclear isotope lights up the arteries so that the nuclear images are clear. The isotope is absorbed by your body. This results in low radiation exposure.  A resting nuclear image is taken to show how your heart functions at rest.  A medicine is given through the IV access.  A second scan is  done about 1 hour after the medicine injection and determines how your heart functions under stress.  During this stress phase, you will be connected to an electrocardiogram machine. Your blood pressure and oxygen levels will be monitored. AFTER THE PROCEDURE   Your heart rate and blood pressure will be monitored after the test.  You may return to your normal schedule, including diet,activities, and medicines, unless your health care provider tells you otherwise.   This information is not intended to replace advice given to you by your health care provider. Make sure you discuss any questions you have with your health care provider.   Document Released: 12/01/2008 Document Revised: 07/20/2013 Document Reviewed: 03/22/2013 Elsevier Interactive Patient Education 2016 [employer].   Echocardiogram An echocardiogram, or echocardiography, uses sound waves (ultrasound) to produce an image of your heart. The echocardiogram is simple, painless, obtained within a short period of time, and offers valuable information to your health care provider. The images from an echocardiogram can provide information such as:  Evidence of coronary artery disease (CAD).  Heart size.  Heart muscle function.  Heart valve function.  Aneurysm detection.  Evidence of a past heart attack.  Fluid buildup around the heart.  Heart muscle thickening.  Assess heart valve function. LET [facility] CARE PROVIDER KNOW ABOUT:  Any allergies you have.  All medicines you are taking, including vitamins, herbs, eye drops, creams, and over-the-counter medicines.  Previous problems you or members of your family have had with the use of anesthetics.  Any blood disorders you have.  Previous surgeries you have had.  Medical conditions you have.  Possibility of pregnancy, if this applies. BEFORE THE PROCEDURE  No special preparation is needed. Eat and drink normally.  PROCEDURE   In order to produce an image  of your heart, gel will be applied to your chest and a wand-like tool (transducer) will be moved over your chest. The gel will help transmit the sound waves from the transducer. The sound waves will harmlessly bounce off your heart to allow the heart images to be captured in real-time motion. These images will then be recorded.  You may need an IV to receive a medicine that improves the quality of the pictures. AFTER THE PROCEDURE You may return to your normal schedule including diet, activities, and medicines, unless your health care provider tells you otherwise.   This information is not intended to replace advice given to you by your health care provider. Make sure you discuss any questions you have with your health care provider.   Document Released: 07/12/2000 Document Revised: 08/05/2014 Document Reviewed: 03/22/2013 Elsevier Interactive Patient Education 2016 Elsevier Inc.   Nitroglycerin sublingual tablets What is this medicine? NITROGLYCERIN (nye troe GLI ser in) is a type of vasodilator. It relaxes blood vessels, increasing  the blood and oxygen supply to your heart. This medicine is used to relieve chest pain caused by angina. It is also used to prevent chest pain before activities like climbing stairs, going outdoors in cold weather, or sexual activity. This medicine may be used for other purposes; ask your health care provider or pharmacist if you have questions. What should I tell my health care provider before I take this medicine? They need to know if you have any of these conditions: -anemia -head injury, recent stroke, or bleeding in the brain -liver disease -previous heart attack -an unusual or allergic reaction to nitroglycerin, other medicines, foods, dyes, or preservatives -pregnant or trying to get pregnant -breast-feeding How should I use this medicine? Take this medicine by mouth as needed. At the first sign of an angina attack (chest pain or tightness) place one  tablet under your tongue. You can also take this medicine 5 to 10 minutes before an event likely to produce chest pain. Follow the directions on the prescription label. Let the tablet dissolve under the tongue. Do not swallow whole. Replace the dose if you accidentally swallow it. It will help if your mouth is not dry. Saliva around the tablet will help it to dissolve more quickly. Do not eat or drink, smoke or chew tobacco while a tablet is dissolving. If you are not better within 5 minutes after taking ONE dose of nitroglycerin, call 9-1-1 immediately to seek emergency medical care. Do not take more than 3 nitroglycerin tablets over 15 minutes. If you take this medicine often to relieve symptoms of angina, your doctor or health care professional may provide you with different instructions to manage your symptoms. If symptoms do not go away after following these instructions, it is important to call 9-1-1 immediately. Do not take more than 3 nitroglycerin tablets over 15 minutes. Talk to your pediatrician regarding the use of this medicine in children. Special care may be needed. Overdosage: If you think you have taken too much of this medicine contact a poison control center or emergency room at once. NOTE: This medicine is only for you. Do not share this medicine with others. What if I miss a dose? This does not apply. This medicine is only used as needed. What may interact with this medicine? Do not take this medicine with any of the following medications: -certain migraine medicines like ergotamine and dihydroergotamine (DHE) -medicines used to treat erectile dysfunction like sildenafil, tadalafil, and vardenafil -riociguat This medicine may also interact with the following medications: -alteplase -aspirin -heparin -medicines for high blood pressure -medicines for mental depression -other medicines used to treat angina -phenothiazines like chlorpromazine, mesoridazine, prochlorperazine,  thioridazine This list may not describe all possible interactions. Give your health care provider a list of all the medicines, herbs, non-prescription drugs, or dietary supplements you use. Also tell them if you smoke, drink alcohol, or use illegal drugs. Some items may interact with your medicine. What should I watch for while using this medicine? Tell your doctor or health care professional if you feel your medicine is no longer working. Keep this medicine with you at all times. Sit or lie down when you take your medicine to prevent falling if you feel dizzy or faint after using it. Try to remain calm. This will help you to feel better faster. If you feel dizzy, take several deep breaths and lie down with your feet propped up, or bend forward with your head resting between your knees. You may get drowsy or  dizzy. Do not drive, use machinery, or do anything that needs mental alertness until you know how this drug affects you. Do not stand or sit up quickly, especially if you are an older patient. This reduces the risk of dizzy or fainting spells. Alcohol can make you more drowsy and dizzy. Avoid alcoholic drinks. Do not treat yourself for coughs, colds, or pain while you are taking this medicine without asking your doctor or health care professional for advice. Some ingredients may increase your blood pressure. What side effects may I notice from receiving this medicine? Side effects that you should report to your doctor or health care professional as soon as possible: -blurred vision -dry mouth -skin rash -sweating -the feeling of extreme pressure in the head -unusually weak or tired Side effects that usually do not require medical attention (report to your doctor or health care professional if they continue or are bothersome): -flushing of the face or neck -headache -irregular heartbeat, palpitations -nausea, vomiting This list may not describe all possible side effects. Call your doctor for  medical advice about side effects. You may report side effects to FDA at 1-800-FDA-1088. Where should I keep my medicine? Keep out of the reach of children. Store at room temperature between 20 and 25 degrees C (68 and 77 degrees F). Store in [employer]. Protect from light and moisture. Keep tightly closed. Throw away any unused medicine after the expiration date. NOTE: This sheet is a summary. It may not cover all possible information. If you have questions about this medicine, talk to your doctor, pharmacist, or health care provider.    2016, Elsevier/Gold Standard. (2013-05-13 17:57:36)

## 2016-01-22 ENCOUNTER — Telehealth: Payer: Self-pay | Admitting: Cardiology

## 2016-01-22 NOTE — Telephone Encounter (Signed)
Left voicemail message with time and instructions for stress test scheduled for tomorrow and instructions to call back if any further questions.

## 2016-01-23 ENCOUNTER — Ambulatory Visit
Admission: RE | Admit: 2016-01-23 | Discharge: 2016-01-23 | Disposition: A | Discharge status: Home / Self Care | Payer: BLUE CROSS/BLUE SHIELD | Source: Ambulatory Visit | Attending: Cardiology | Admitting: Cardiology

## 2016-01-23 DIAGNOSIS — R0602 Shortness of breath: Secondary | ICD-10-CM

## 2016-01-23 DIAGNOSIS — R079 Chest pain, unspecified: Secondary | ICD-10-CM

## 2016-01-23 MED ORDER — REGADENOSON 0.4 MG/5ML IV SOLN
0.4000 mg | Freq: Once | INTRAVENOUS | Status: AC
  Administered 2016-01-23: 0.4 mg via INTRAVENOUS

## 2016-01-23 MED ORDER — TECHNETIUM TC 99M TETROFOSMIN IV KIT
31.6820 | PACK | Freq: Once | INTRAVENOUS | Status: AC | PRN
  Administered 2016-01-23: 31.682 via INTRAVENOUS

## 2016-01-23 MED ORDER — TECHNETIUM TC 99M TETROFOSMIN IV KIT
13.0000 | PACK | Freq: Once | INTRAVENOUS | Status: AC | PRN
  Administered 2016-01-23: 12.64 via INTRAVENOUS

## 2016-01-24 LAB — NM MYOCAR MULTI W/SPECT W/WALL MOTION / EF
CHL CUP RESTING HR STRESS: 80 {beats}/min
CHL CUP STRESS STAGE 1 HR: 84 {beats}/min
CHL CUP STRESS STAGE 2 GRADE: 0 %
CHL CUP STRESS STAGE 2 HR: 83 {beats}/min
CHL CUP STRESS STAGE 3 HR: 83 {beats}/min
CHL CUP STRESS STAGE 4 HR: 104 {beats}/min
CHL CUP STRESS STAGE 5 HR: 103 {beats}/min
CHL CUP STRESS STAGE 5 SPEED: 0 mph
CHL CUP STRESS STAGE 6 HR: 93 {beats}/min
CSEPEW: 1 METS
CSEPHR: 65 %
CSEPPHR: 104 {beats}/min
LVDIAVOL: 94 mL (ref 46–106)
LVSYSVOL: 36 mL
Percent of predicted max HR: 63 %
SDS: 0
SRS: 0
SSS: 0
Stage 2 Speed: 0 mph
Stage 3 Grade: 0 %
Stage 3 Speed: 0 mph
Stage 4 Grade: 0 %
Stage 4 Speed: 0 mph
Stage 5 Grade: 0 %
Stage 6 DBP: 67 mmHg
Stage 6 Grade: 0 %
Stage 6 SBP: 149 mmHg
Stage 6 Speed: 0 mph
TID: 0.81

## 2016-01-31 ENCOUNTER — Ambulatory Visit (INDEPENDENT_AMBULATORY_CARE_PROVIDER_SITE_OTHER): Payer: BLUE CROSS/BLUE SHIELD

## 2016-01-31 ENCOUNTER — Other Ambulatory Visit: Payer: Self-pay

## 2016-01-31 DIAGNOSIS — R0602 Shortness of breath: Secondary | ICD-10-CM | POA: Diagnosis not present

## 2016-01-31 DIAGNOSIS — R079 Chest pain, unspecified: Secondary | ICD-10-CM

## 2016-01-31 LAB — ECHOCARDIOGRAM COMPLETE
E decel time: 261 msec
EERAT: 10.8
FS: 42 % (ref 28–44)
IV/PV OW: 1.08
LA ID, A-P, ES: 40 mm
LA diam end sys: 40 mm
LA vol index: 27 mL/m2
LADIAMINDEX: 1.71 cm/m2
LAVOL: 63.2 mL
LAVOLA4C: 77 mL
LV E/e'average: 10.8
LV SIMPSON'S DISK: 64
LV TDI E'LATERAL: 9.03
LV dias vol index: 32 mL/m2
LV dias vol: 75 mL (ref 46–106)
LV e' LATERAL: 9.03 cm/s
LV sys vol index: 11 mL/m2
LVEEMED: 10.8
LVOT SV: 69 mL
LVOT VTI: 19.9 cm
LVOT area: 3.46 cm2
LVOT diameter: 21 mm
LVOT peak vel: 102 cm/s
LVSYSVOL: 27 mL (ref 14–42)
MV Dec: 261
MV Peak grad: 4 mmHg
MV pk E vel: 97.5 m/s
MVPKAVEL: 84.9 m/s
PW: 8.3 mm — AB (ref 0.6–1.1)
RV TAPSE: 29.6 mm
Stroke v: 48 ml
TDI e' medial: 10.3

## 2016-02-02 ENCOUNTER — Ambulatory Visit (INDEPENDENT_AMBULATORY_CARE_PROVIDER_SITE_OTHER): Payer: BLUE CROSS/BLUE SHIELD | Admitting: Family Medicine

## 2016-02-02 ENCOUNTER — Encounter: Payer: Self-pay | Admitting: Family Medicine

## 2016-02-02 VITALS — BP 128/90 | HR 91 | Temp 98.4°F | Resp 16 | Wt 252.6 lb

## 2016-02-02 DIAGNOSIS — I208 Other forms of angina pectoris: Secondary | ICD-10-CM

## 2016-02-02 DIAGNOSIS — F331 Major depressive disorder, recurrent, moderate: Secondary | ICD-10-CM

## 2016-02-02 DIAGNOSIS — K5900 Constipation, unspecified: Secondary | ICD-10-CM

## 2016-02-02 DIAGNOSIS — F411 Generalized anxiety disorder: Secondary | ICD-10-CM

## 2016-02-02 DIAGNOSIS — K5909 Other constipation: Secondary | ICD-10-CM

## 2016-02-02 MED ORDER — METOPROLOL SUCCINATE ER 25 MG PO TB24
25.0000 mg | ORAL_TABLET | Freq: Every day | ORAL | Status: DC
Start: 2016-02-02 — End: 2016-02-02

## 2016-02-02 MED ORDER — METOPROLOL SUCCINATE ER 25 MG PO TB24: 25.0000 mg | ORAL_TABLET | Freq: Every day | ORAL | Status: DC

## 2016-02-02 MED ORDER — LINACLOTIDE 145 MCG PO CAPS: 145.0000 ug | ORAL_CAPSULE | Freq: Every day | ORAL | Status: DC

## 2016-02-02 MED ORDER — ALPRAZOLAM ER 0.5 MG PO TB24: 0.5000 mg | ORAL_TABLET | Freq: Every day | ORAL | Status: DC

## 2016-02-02 NOTE — Progress Notes (Signed)
Name: Beverly Barrett   MRN: [id number]    DOB: 1961/04/16   Date:02/02/2016       Progress Note  Subjective  Chief Complaint  Chief Complaint  Patient presents with  . Depression    patient is here for her 31-month f/u  . Chest Pain  . Hypertension  . Anxiety    HPI  Major Depression/GAD: She has a long history of depression and has recently (2016) been placed on Paroxetine 40 mg in the morning and Depakote 250 mg DR one tablet in the evenings, however since Depakote was not helping we tried Buspar ( May 2017 ) but again no improvement of symptoms and she did not refill medication. She states she still cries daily, and occasionally has suicidal ideation but no planning ( she states she would not do it because of her grandson ). She complains of depressed mood, fatigue, feelings of worthlessness/guilt,  and insomnia. Family history significant for anxiety and depression. She has severe anxiety, difficulty interacting with others, she picks on her nails and makes herself bleed at times, constantly shaking her legs, restless. Discussed psychiatric evaluation but she wants to hold off for now because of cost. We will try adding Alprazolam XR.   HTN: she taking her medication Exforge HCTZ and bp is better, she still has sensation of fluttering in her chest and severe anxiety and bp is still not at goal, we will add Toprol XL low dose and monitor.  Angina: had evaluation by cardiologist and Echo myoview this week, normal result. She continues to have substernal pressure like chest pain, radiates to her back and sometimes epigastric area. Lasts about 15 minutes about 2 times monthly. She has follow up with cardiologist in 2 weeks. She also has SOB with activity   Constipation: she is taking Miralax daily and still having periods of no bowel movements to up to 2 weeks, took one of her husbands medication but made her vomit ( not sure of the name ). She has been able to eat, no blood in stools,  she is passing gas. We will try Linzess.    Patient Active Problem List   Diagnosis Date Noted  . GAD (generalized anxiety disorder) 12/20/2015  . OSA on CPAP 12/20/2015  . Angina effort ([place]) 12/20/2015  . Hyperglycemia 09/22/2015  . History of colonic polyps   . Benign neoplasm of sigmoid colon   . Frequency 04/14/2015  . Midline low back pain without sciatica 04/14/2015  . Chronic pain of multiple joints 06/14/2014  . Dysmetabolic syndrome [id number]  . Acid reflux 06/14/2014  . Extreme obesity ([place]) 06/14/2014  . Arthritis, degenerative 06/14/2014  . Morbid obesity with BMI of 40.0-44.9, adult ([place]) 06/14/2014  . Hypothyroidism due to acquired atrophy of thyroid 08/18/2013  . Hyperlipidemia 12/07/2010  . Hypertension goal BP (blood pressure) < 140/90 12/07/2010  . Familial multiple lipoprotein-type hyperlipidemia 12/07/2010  . Chest pain 11/16/2010  . CN (constipation) 11/21/2009  . Allergic rhinitis 10/13/2008  . Asthma, mild intermittent, well-controlled 09/01/2008  . Major depressive disorder, recurrent episode, in partial remission with anxious distress ([place]) 08/13/2007    Past Surgical History  Procedure Laterality Date  . Cesarean section    . Abdominal hysterectomy    . Colonoscopy with propofol N/A 05/29/2015    Procedure: COLONOSCOPY WITH PROPOFOL;  Surgeon: Lucilla Lame, MD;  Location: [place];  Service: Endoscopy;  Laterality: N/A;  Latex allergy sleep apnea - no CPAP machine (yet)  . Polypectomy  05/29/2015  Procedure: POLYPECTOMY;  Surgeon: Lucilla Lame, MD;  Location: [place];  Service: Endoscopy;;    Family History  Problem Relation Age of Onset  . Diabetes Sister   . Hypertension Sister   . Cirrhosis Mother   . Cirrhosis Father   . Breast cancer Sister   . Heart attack Brother     Social History   Social History  . Marital Status: Married    Spouse Name: N/A  . Number of Children: N/A  . Years of Education: N/A    Occupational History  . Not on file.   Social History Main Topics  . Smoking status: Never Smoker   . Smokeless tobacco: Never Used  . Alcohol Use: No  . Drug Use: No  . Sexual Activity:    Partners: Male   Other Topics Concern  . Not on file   Social History Narrative     Current outpatient prescriptions:  .  ALPRAZolam (XANAX XR) 0.5 MG 24 hr tablet, Take 1 tablet (0.5 mg total) by mouth daily., Disp: 30 tablet, Rfl: 0 .  Amlodipine-Valsartan-HCTZ 5-160-25 MG TABS, Take 1 tablet by mouth daily., Disp: 90 tablet, Rfl: 0 .  aspirin 81 MG EC tablet, Take 81 mg by mouth daily.  , Disp: , Rfl:  .  atorvastatin (LIPITOR) 40 MG tablet, Take 1 tablet (40 mg total) by mouth at bedtime., Disp: 90 tablet, Rfl: 1 .  Azelastine HCl 0.15 % SOLN, Place 2 sprays into both nostrils 2 (two) times daily. , Disp: , Rfl: 3 .  diclofenac (VOLTAREN) 75 MG EC tablet, Take 1 tablet (75 mg total) by mouth every 12 (twelve) hours as needed., Disp: 180 tablet, Rfl: 0 .  docusate sodium (COLACE) 100 MG capsule, Take 100 mg by mouth 2 (two) times daily., Disp: , Rfl:  .  EQ ALLERGY RELIEF 10 MG tablet, TAKE ONE TABLET BY MOUTH ONCE DAILY, Disp: 90 tablet, Rfl: 1 .  fluticasone (FLONASE) 50 MCG/ACT nasal spray, by Nasal route., Disp: , Rfl:  .  levothyroxine (SYNTHROID, LEVOTHROID) 112 MCG tablet, Take 1 tablet (112 mcg total) by mouth daily before breakfast., Disp: 90 tablet, Rfl: 0 .  metoprolol succinate (TOPROL XL) 25 MG 24 hr tablet, Take 1 tablet (25 mg total) by mouth daily., Disp: 30 tablet, Rfl: 0 .  montelukast (SINGULAIR) 10 MG tablet, Take 1 tablet (10 mg total) by mouth at bedtime., Disp: 90 tablet, Rfl: 0 .  nitroGLYCERIN (NITROSTAT) 0.4 MG SL tablet, Place 1 tablet (0.4 mg total) under the tongue every 5 (five) minutes as needed., Disp: 25 tablet, Rfl: 6 .  omeprazole (PRILOSEC) 40 MG capsule, Take 1 capsule (40 mg total) by mouth daily., Disp: 90 capsule, Rfl: 1 .  PARoxetine (PAXIL) 40 MG  tablet, Take 1 tablet (40 mg total) by mouth every morning., Disp: 90 tablet, Rfl: 1  Allergies  Allergen Reactions  . Latex     Pt unsure of reaction pt states something happened while in the hospital after surgery.     ROS  Constitutional: Negative for fever or significant weight change.  Respiratory: Negative for cough , positive for  shortness of breath.   Cardiovascular: Positive  for chest pain and  palpitations.  Gastrointestinal: Negative for abdominal pain, positive for change in  bowel changes - constipation is worse  Musculoskeletal: Negative for gait problem or joint swelling.  Skin: Negative for rash.  Neurological: Negative for dizziness or headache.  No other specific complaints in a complete  review of systems (except as listed in HPI above).  Objective  Filed Vitals:   02/02/16 1132  BP: 128/90  Pulse: 91  Temp: 98.4 F (36.9 C)  TempSrc: Oral  Resp: 16  Weight: 252 lb 9.6 oz (114.579 kg)  SpO2: 96%    Body mass index is 43.34 kg/(m^2).  Physical Exam  Constitutional: Patient appears well-developed and well-nourished. Obese  No distress.  HEENT: head atraumatic, normocephalic, pupils equal and reactive to light,  neck supple, throat within normal limits Cardiovascular: Normal rate, regular rhythm and normal heart sounds.  No murmur heard. No BLE edema. Pulmonary/Chest: Effort normal and breath sounds normal. No respiratory distress. Abdominal: Soft.  There is no tenderness. Psychiatric: Patient has a normal mood and affect. She is fidgety, shaking her legs, Judgment and thought content normal.  Recent Results (from the past 2160 hour(s))  Urine culture     Status: None   Collection Time: 12/20/15 12:00 AM  Result Value Ref Range   Urine Culture, Routine Final report    Urine Culture result 1 Comment     Comment: Mixed urogenital flora 10,000-25,000 colony forming units per mL   Lipid panel     Status: Abnormal   Collection Time: 12/20/15 10:12 AM   Result Value Ref Range   Cholesterol, Total 192 100 - 199 mg/dL   Triglycerides 215 (H) 0 - 149 mg/dL   HDL 46 >39 mg/dL   VLDL Cholesterol Cal 43 (H) 5 - 40 mg/dL   LDL Calculated 103 (H) 0 - 99 mg/dL   Chol/HDL Ratio 4.2 0.0 - 4.4 ratio units    Comment:                                   T. Chol/HDL Ratio                                             Men  Women                               1/2 Avg.Risk  3.4    3.3                                   Avg.Risk  5.0    4.4                                2X Avg.Risk  9.6    7.1                                3X Avg.Risk 23.4   11.0   Comprehensive metabolic panel     Status: Abnormal   Collection Time: 12/20/15 10:12 AM  Result Value Ref Range   Glucose 105 (H) 65 - 99 mg/dL   BUN 17 6 - 24 mg/dL   Creatinine, Ser 0.73 0.57 - 1.00 mg/dL   GFR calc non Af Amer 92 >59 mL/min/1.73   GFR calc Af Amer 106 >59 mL/min/1.73   BUN/Creatinine Ratio 23 9 - 23   Sodium 144 134 -  144 mmol/L   Potassium 3.8 3.5 - 5.2 mmol/L   Chloride 98 96 - 106 mmol/L   CO2 25 18 - 29 mmol/L   Calcium 9.4 8.7 - 10.2 mg/dL   Total Protein 7.2 6.0 - 8.5 g/dL   Albumin 4.3 3.5 - 5.5 g/dL   Globulin, Total 2.9 1.5 - 4.5 g/dL   Albumin/Globulin Ratio 1.5 1.2 - 2.2   Bilirubin Total 0.2 0.0 - 1.2 mg/dL   Alkaline Phosphatase 61 39 - 117 IU/L   AST 19 0 - 40 IU/L   ALT 18 0 - 32 IU/L  Hemoglobin A1c     Status: Abnormal   Collection Time: 12/20/15 10:12 AM  Result Value Ref Range   Hgb A1c MFr Bld 6.3 (H) 4.8 - 5.6 %    Comment:          Pre-diabetes: 5.7 - 6.4          Diabetes: >6.4          Glycemic control for adults with diabetes: <7.0    Est. average glucose Bld gHb Est-mCnc 134 mg/dL  TSH     Status: None   Collection Time: 12/20/15 10:12 AM  Result Value Ref Range   TSH 1.170 0.450 - 4.500 uIU/mL  NM Myocar Multi W/Spect W/Wall Motion / EF     Status: None   Collection Time: 01/23/16 10:56 AM  Result Value Ref Range   Rest HR 80 bpm   Rest BP  128/68 mmHg   Exercise duration (min)  min   Exercise duration (sec)  sec   Estimated workload 1.0 METS   Peak HR 104 BPM   Peak BP  mmHg   MPHR  bpm   Percent HR 65 %   RPE     LV sys vol 36 mL   TID 0.81    LV dias vol 94 46 - 106 mL   LHR     SSS 0    SRS 0    SDS 0    Phase 1 name PREINFSN    Stage 1 Name SUPINE    Stage 1 Time 00:00:02    Stage 1 HR 84 bpm   Phase 2 Name [place]    Stage 2 Name HYPERV.    Stage 2 Time 00:01:07    Stage 2 Speed 0.0 mph   Stage 2 Grade 0.0 %   Stage 2 HR 83 bpm   Phase 3 Name Infusion    Stage 3 Name DOSE 1    Stage 3 Attribute Baseline    Stage 3 Time 00:00:01    Stage 3 Speed 0.0 mph   Stage 3 Grade 0.0 %   Stage 3 HR 83 bpm   Phase 4 Name Infusion    Stage 4 Name DOSE 1    Stage 4 Attribute Peak    Stage 4 Time 00:01:01    Stage 4 Speed 0.0 mph   Stage 4 Grade 0.0 %   Stage 4 HR 104 bpm   Phase 5 Name POSTINFSN    Stage 5 Attribute Recovery 5min    Stage 5 Time 00:01:00    Stage 5 Speed 0.0 mph   Stage 5 Grade 0.0 %   Stage 5 HR 103 bpm   Phase 6 Name POSTINFSN    Stage 6 Time 00:03:59    Stage 6 Speed 0.0 mph   Stage 6 Grade 0.0 %   Stage 6 HR 93 bpm   Stage 6  SBP 149 mmHg   Stage 6 DBP 67 mmHg   Percent of predicted max HR 63 %  ECHOCARDIOGRAM COMPLETE     Status: Abnormal   Collection Time: 01/31/16 11:29 AM  Result Value Ref Range   LV PW d 8.3 (A) 0.6 - 1.1 mm   FS 42 28 - 44 %   LA vol 63.2 mL   LA ID, A-P, ES 40 mm   IVS/LV PW RATIO, ED 1.08    Stroke v 48 ml   LVOT VTI 19.9 cm   LV e' LATERAL 9.03 cm/s   LV E/e' medial 10.8    LV E/e'average 10.8    LA diam index 1.71 cm/m2   LA vol A4C 77 ml   E decel time 261 msec   LVOT diameter 21 mm   LVOT area 3.46 cm2   LVOT peak vel 102 cm/s   LVOT SV 69 mL   Peak grad 4 mmHg   E/e' ratio 10.8    MV pk E vel 97.5 m/s   MV pk A vel 84.9 m/s   LV sys vol 27 14 - 42 mL   LV sys vol index 11 mL/m2   LV dias vol 75 46 - 106 mL   LV dias vol index 32  mL/m2   LA vol index 27 mL/m2   MV Dec 261    LA diam end sys 40 mm   Simpson's disk 64    TDI e' medial 10.3    TDI e' lateral 9.03    TAPSE 29.6 mm     PHQ2/9: Depression screen [facility] 2/9 02/02/2016 09/22/2015 06/16/2015 04/14/2015  Decreased Interest 3 1 0 1  Down, Depressed, Hopeless 3 1 1 3   PHQ - 2 Score 6 2 1 4   Altered sleeping 3 0 - 3  Tired, decreased energy 3 1 - 2  Change in appetite 0 0 - 2  Feeling bad or failure about yourself  3 1 - 2  Trouble concentrating 3 0 - 2  Moving slowly or fidgety/restless 2 0 - 1  Suicidal thoughts 2 1 - 1  PHQ-9 Score 22 5 - 17  Difficult doing work/chores Very difficult - - Very difficult     Fall Risk: Fall Risk  02/02/2016 09/22/2015 06/16/2015 04/14/2015  Falls in the past year? No No No Yes  Number falls in past yr: - - - 1  Injury with Fall? - - - Yes      Functional Status Survey: Is the patient deaf or have difficulty hearing?: No Does the patient have difficulty seeing, even when wearing glasses/contacts?: No Does the patient have difficulty concentrating, remembering, or making decisions?: Yes (at times) Does the patient have difficulty walking or climbing stairs?: No Does the patient have difficulty dressing or bathing?: No Does the patient have difficulty doing errands alone such as visiting a doctor's office or shopping?: No    Assessment & Plan  1. Depression, major, recurrent, moderate ([place])  Continue Paxil for now  2. GAD (generalized anxiety disorder)  We will add beta-blocker and Xanax XR, discussed possible side effects of medications - ALPRAZolam (XANAX XR) 0.5 MG 24 hr tablet; Take 1 tablet (0.5 mg total) by mouth daily.  Dispense: 30 tablet; Refill: 0 - metoprolol succinate (TOPROL XL) 25 MG 24 hr tablet; Take 1 tablet (25 mg total) by mouth daily.  Dispense: 30 tablet; Refill: 0  3. Angina effort [facility])  Continue follow up with cardiologist  -  metoprolol succinate (TOPROL XL) 25 MG 24 hr tablet;  Take 1 tablet (25 mg total) by mouth daily.  Dispense: 30 tablet; Refill: 0 Also advised to take Diclofenac prn only since she has been having epigastric pain  4. Chronic constipation  We will try Linzess, she can also try taking Miralax twice daily if no improvement call back and we will refer her to GI - linaclotide (LINZESS) 145 MCG CAPS capsule; Take 1 capsule (145 mcg total) by mouth daily before breakfast.  Dispense: 30 capsule; Refill: 0

## 2016-02-20 ENCOUNTER — Ambulatory Visit (INDEPENDENT_AMBULATORY_CARE_PROVIDER_SITE_OTHER): Payer: BLUE CROSS/BLUE SHIELD | Admitting: Cardiology

## 2016-02-20 ENCOUNTER — Encounter: Payer: Self-pay | Admitting: Cardiology

## 2016-02-20 ENCOUNTER — Encounter (INDEPENDENT_AMBULATORY_CARE_PROVIDER_SITE_OTHER): Payer: Self-pay

## 2016-02-20 VITALS — BP 134/84 | HR 64 | Ht 64.0 in | Wt 254.8 lb

## 2016-02-20 DIAGNOSIS — R0789 Other chest pain: Secondary | ICD-10-CM

## 2016-02-20 DIAGNOSIS — E785 Hyperlipidemia, unspecified: Secondary | ICD-10-CM

## 2016-02-20 DIAGNOSIS — I10 Essential (primary) hypertension: Secondary | ICD-10-CM | POA: Diagnosis not present

## 2016-02-20 DIAGNOSIS — E669 Obesity, unspecified: Secondary | ICD-10-CM | POA: Diagnosis not present

## 2016-02-20 NOTE — Progress Notes (Signed)
Cardiology Office Note   Date:  02/20/2016   ID:  Beverly Barrett, DOB 1960/08/27, MRN [id number]  Referring Doctor:  Loistine Chance, MD   Cardiologist:   Wende Bushy, MD   Reason for consultation:  Chief Complaint  Patient presents with  . Other    F/u echo and myoview. Meds reviewed verbally with pt.      History of Present Illness: Beverly Barrett is a 55 y.o. female who presents for Follow-up after testing.  Dose of PPI was recently increased. She is starting to notice improvement in her symptoms.  Patient denies fever, cough, colds, abdominal pain. No PND, orthopnea or edema. No palpitations. No loss of consciousness.   ROS:  Please see the history of present illness. Aside from mentioned under HPI, all other systems are reviewed and negative.     Past Medical History:  Diagnosis Date  . Anginal pain ([place])    none in approx 2 yrs  . Asthma   . Chronic lower back pain   . Depression   . GERD (gastroesophageal reflux disease)   . Headache    migraines -2x/month  . Hyperlipidemia   . Hypertension   . Hypothyroidism   . Motion sickness    cars  . Shortness of breath dyspnea   . Sleep apnea    No machine  . Vertigo   . Wears dentures    partial upper    Past Surgical History:  Procedure Laterality Date  . ABDOMINAL HYSTERECTOMY    . CESAREAN SECTION    . COLONOSCOPY WITH PROPOFOL N/A 05/29/2015   Procedure: COLONOSCOPY WITH PROPOFOL;  Surgeon: Lucilla Lame, MD;  Location: [place];  Service: Endoscopy;  Laterality: N/A;  Latex allergy sleep apnea - no CPAP machine (yet)  . POLYPECTOMY  05/29/2015   Procedure: POLYPECTOMY;  Surgeon: Lucilla Lame, MD;  Location: [place];  Service: Endoscopy;;     reports that she has never smoked. She has never used smokeless tobacco. She reports that she does not drink alcohol or use drugs.   family history includes Breast cancer in her sister; Cirrhosis in her father and mother; Diabetes  in her sister; Heart attack in her brother; Hypertension in her sister.   Current Outpatient Prescriptions  Medication Sig Dispense Refill  . ALPRAZolam (XANAX XR) 0.5 MG 24 hr tablet Take 1 tablet (0.5 mg total) by mouth daily. 30 tablet 0  . Amlodipine-Valsartan-HCTZ 5-160-25 MG TABS Take 1 tablet by mouth daily. 90 tablet 0  . aspirin 81 MG EC tablet Take 81 mg by mouth daily.      Marland Kitchen atorvastatin (LIPITOR) 40 MG tablet Take 1 tablet (40 mg total) by mouth at bedtime. 90 tablet 1  . Azelastine HCl 0.15 % SOLN Place 2 sprays into both nostrils 2 (two) times daily.   3  . diclofenac (VOLTAREN) 75 MG EC tablet Take 1 tablet (75 mg total) by mouth every 12 (twelve) hours as needed. 180 tablet 0  . docusate sodium (COLACE) 100 MG capsule Take 100 mg by mouth 2 (two) times daily.    [name] ALLERGY RELIEF 10 MG tablet TAKE ONE TABLET BY MOUTH ONCE DAILY 90 tablet 1  . fluticasone (FLONASE) 50 MCG/ACT nasal spray by Nasal route.    Marland Kitchen levothyroxine (SYNTHROID, LEVOTHROID) 112 MCG tablet Take 1 tablet (112 mcg total) by mouth daily before breakfast. 90 tablet 0  . linaclotide (LINZESS) 145 MCG CAPS capsule Take 1 capsule (145 mcg total)  by mouth daily before breakfast. 30 capsule 0  . metoprolol succinate (TOPROL XL) 25 MG 24 hr tablet Take 1 tablet (25 mg total) by mouth daily. 30 tablet 0  . montelukast (SINGULAIR) 10 MG tablet Take 1 tablet (10 mg total) by mouth at bedtime. 90 tablet 0  . nitroGLYCERIN (NITROSTAT) 0.4 MG SL tablet Place 1 tablet (0.4 mg total) under the tongue every 5 (five) minutes as needed. 25 tablet 6  . omeprazole (PRILOSEC) 40 MG capsule Take 1 capsule (40 mg total) by mouth daily. 90 capsule 1  . PARoxetine (PAXIL) 40 MG tablet Take 1 tablet (40 mg total) by mouth every morning. 90 tablet 1   No current facility-administered medications for this visit.     Allergies: Latex    PHYSICAL EXAM: VS:  BP 134/84 (BP Location: Right Arm, Patient Position: Sitting, Cuff Size:  Large)   Pulse 64   Ht 5\' 4"  (1.626 m)   Wt 254 lb 12 oz (115.6 kg)   BMI 43.73 kg/m  , Body mass index is 43.73 kg/m. Wt Readings from Last 3 Encounters:  02/20/16 254 lb 12 oz (115.6 kg)  02/02/16 252 lb 9.6 oz (114.6 kg)  01/11/16 253 lb 12.8 oz (115.1 kg)    GENERAL:  well developed, well nourished, obese, not in acute distress HEENT: normocephalic, pink conjunctivae, anicteric sclerae, no xanthelasma, normal dentition, oropharynx clear NECK:  no neck vein engorgement, JVP normal, no hepatojugular reflux, carotid upstroke brisk and symmetric, no bruit, no thyromegaly, no lymphadenopathy LUNGS:  good respiratory effort, clear to auscultation bilaterally CV:  PMI not displaced, no thrills, no lifts, S1 and S2 within normal limits, no palpable S3 or S4, no murmurs, no rubs, no gallops ABD:  Soft, nontender, nondistended, normoactive bowel sounds, no abdominal aortic bruit, no hepatomegaly, no splenomegaly MS: nontender back, no kyphosis, no scoliosis, no joint deformities EXT:  2+ DP/PT pulses, no edema, no varicosities, no cyanosis, no clubbing SKIN: warm, nondiaphoretic, normal turgor, no ulcers NEUROPSYCH: alert, oriented to person, place, and time, sensory/motor grossly intact, normal mood, appropriate affect  Recent Labs: 12/20/2015: ALT 18; BUN 17; Creatinine, Ser 0.73; Potassium 3.8; Sodium 144; TSH 1.170   Lipid Panel    Component Value Date/Time   CHOL 192 12/20/2015 1012   TRIG 215 (H) 12/20/2015 1012   HDL 46 12/20/2015 1012   CHOLHDL 4.2 12/20/2015 1012   LDLCALC 103 (H) 12/20/2015 1012     Other studies Reviewed:  EKG:  The ekg from 01/11/2016 was personally reviewed by me and it revealed sinus rhythm, 84 BPM. Nonspecific ST-T wave changes.  Additional studies/ records that were reviewed personally reviewed by me today include:  Echo 01/31/2016: Left ventricle: The cavity size was mildly dilated. Wall   thickness was normal. Systolic function was vigorous.  The   estimated ejection fraction was in the range of 65% to 70%. Wall   motion was normal; there were no regional wall motion   abnormalities. Left ventricular diastolic function parameters   were normal.  Nuclear stress is 01/23/2016: Pharmacological myocardial perfusion imaging study with no significant  ischemia Normal wall motion, EF estimated at 68% No EKG changes concerning for ischemia at peak stress or in recovery. Low risk scan    ASSESSMENT AND PLAN:  Chest pain Shortness of breath Risk factors for CAD include hypertension, hyperlipidemia, obesity, family history. She had a stress test done within 5 years ago. She recalls that the chest pain at the time was more  of reflux. Her chest pain currently is of a different quality. She is unable to walk on the treadmill for significant amount of time.  No evidence of ischemia on stress test. LVEF is within normal limits. Results discussed with patient and family. Likelihood of clinically significant CAD is low. Risk factor modification recommended.  Hypertension BP is well controlled. Continue monitoring BP. Continue current medical therapy and lifestyle changes.  Hyperlipidemia Patient on statin therapy, PCP following labs.  Obesity Body mass index is 43.73 kg/m.Marland Kitchen Recommend aggressive weight loss through diet and increased physical activity. Once cardiac workup is done.  Current medicines are reviewed at length with the patient today.  The patient does not have concerns regarding medicines.  Labs/ tests ordered today include:  No orders of the defined types were placed in this encounter.   I had a lengthy and detailed discussion with the patient regarding diagnoses, prognosis, diagnostic options, treatment options , and side effects of medications.   I counseled the patient on importance of lifestyle modification including heart healthy diet, regular physical activity .  Disposition:   FU with undersigned  prn  Signed, Wende Bushy, MD  02/20/2016 9:33 AM    [place]

## 2016-02-20 NOTE — Patient Instructions (Addendum)
Medication Instructions:  Your physician recommends that you continue on your current medications as directed. Please refer to the Current Medication list given to you today.   Labwork: None ordered  Testing/Procedures: None ordered  Follow-Up: Your physician recommends that you schedule a follow-up appointment as needed.  It was a pleasure seeing you today here in the office. Please do not hesitate to give [place] a call back if you have any further questions. [place], BSN      Any Other Special Instructions Will Be Listed Below (If Applicable).     If you need a refill on your cardiac medications before your next appointment, please call your pharmacy.   Heart-Healthy Eating Plan Heart-healthy meal planning includes:  Limiting unhealthy fats.  Increasing healthy fats.  Making other small dietary changes. You may need to talk with your doctor or a diet specialist (dietitian) to create an eating plan that is right for you. WHAT TYPES OF FAT SHOULD I CHOOSE?  Choose healthy fats. These include olive oil and canola oil, flaxseeds, walnuts, almonds, and seeds.  Eat more omega-3 fats. These include salmon, mackerel, sardines, tuna, flaxseed oil, and ground flaxseeds. Try to eat fish at least twice each week.  Limit saturated fats.  Saturated fats are often found in animal products, such as meats, butter, and cream.  Plant sources of saturated fats include palm oil, palm kernel oil, and coconut oil.  Avoid foods with partially hydrogenated oils in them. These include stick margarine, some tub margarines, cookies, crackers, and other baked goods. These contain trans fats. WHAT GENERAL GUIDELINES DO I NEED TO FOLLOW?  Check food labels carefully. Identify foods with trans fats or high amounts of saturated fat.  Fill one half of your plate with vegetables and green salads. Eat 4-5 servings of vegetables per day. A serving of vegetables is:  1 cup of raw  leafy vegetables.   cup of raw or cooked cut-up vegetables.   cup of vegetable juice.  Fill one fourth of your plate with whole grains. Look for the word "whole" as the first word in the ingredient list.  Fill one fourth of your plate with lean protein foods.  Eat 4-5 servings of fruit per day. A serving of fruit is:  One medium whole fruit.   cup of dried fruit.   cup of fresh, frozen, or canned fruit.   cup of 100% fruit juice.  Eat more foods that contain soluble fiber. These include apples, broccoli, carrots, beans, peas, and barley. Try to get 20-30 g of fiber per day.  Eat more home-cooked food. Eat less restaurant, buffet, and fast food.  Limit or avoid alcohol.  Limit foods high in starch and sugar.  Avoid fried foods.  Avoid frying your food. Try baking, boiling, grilling, or broiling it instead. You can also reduce fat by:  Removing the skin from poultry.  Removing all visible fats from meats.  Skimming the fat off of stews, soups, and gravies before serving them.  Steaming vegetables in water or broth.  Lose weight if you are overweight.  Eat 4-5 servings of nuts, legumes, and seeds per week:  One serving of dried beans or legumes equals  cup after being cooked.  One serving of nuts equals 1 ounces.  One serving of seeds equals  ounce or one tablespoon.  You may need to keep track of how much salt or sodium you eat. This is especially true if you have high blood pressure.  Talk with your doctor or dietitian to get more information. WHAT FOODS CAN I EAT? Grains Breads, including [place], white, pita, wheat, raisin, rye, oatmeal, and [place]. Tortillas that are neither fried nor made with lard or trans fat. Low-fat rolls, including hotdog and hamburger buns and English muffins. Biscuits. Muffins. Waffles. Pancakes. Light popcorn. Whole-grain cereals. Flatbread. Melba toast. Pretzels. Breadsticks. Rusks. Low-fat snacks. Low-fat crackers, including  oyster, saltine, matzo, graham, animal, and rye. Rice and pasta, including brown rice and pastas that are made with whole wheat.  Vegetables All vegetables.  Fruits All fruits, but limit coconut. Meats and Other Protein Sources Lean, well-trimmed beef, veal, pork, and lamb. Chicken and [place] without skin. All fish and shellfish. Wild duck, rabbit, pheasant, and venison. Egg whites or low-cholesterol egg substitutes. Dried beans, peas, lentils, and tofu. Seeds and most nuts. Dairy Low-fat or nonfat cheeses, including ricotta, string, and mozzarella. Skim or 1% milk that is liquid, powdered, or evaporated. Buttermilk that is made with low-fat milk. Nonfat or low-fat yogurt. Beverages Mineral water. Diet carbonated beverages. Sweets and Desserts Sherbets and fruit ices. Honey, jam, marmalade, jelly, and syrups. Meringues and gelatins. Pure sugar candy, such as hard candy, jelly beans, gumdrops, mints, marshmallows, and small amounts of dark chocolate. [employer] Eat all sweets and desserts in moderation. Fats and Oils Nonhydrogenated (trans-free) margarines. Vegetable oils, including soybean, sesame, sunflower, olive, peanut, safflower, corn, canola, and cottonseed. Salad dressings or mayonnaise made with a vegetable oil. Limit added fats and oils that you use for cooking, baking, salads, and as spreads. Other Cocoa powder. Coffee and tea. All seasonings and condiments. The items listed above may not be a complete list of recommended foods or beverages. Contact your dietitian for more options. WHAT FOODS ARE NOT RECOMMENDED? Grains Breads that are made with saturated or trans fats, oils, or whole milk. Croissants. Butter rolls. Cheese breads. Sweet rolls. Donuts. Buttered popcorn. Chow mein noodles. High-fat crackers, such as cheese or butter crackers. Meats and Other Protein Sources Fatty meats, such as hotdogs, short ribs, sausage, spareribs, bacon, rib eye roast or steak, and mutton.  High-fat deli meats, such as salami and bologna. Caviar. Domestic duck and goose. Organ meats, such as kidney, liver, sweetbreads, and heart. Dairy Cream, sour cream, cream cheese, and creamed cottage cheese. Whole-milk cheeses, including blue (bleu), [place] Jack, [place], [place], American, [place], Swiss, cheddar, [place], and [place]. Whole or 2% milk that is liquid, evaporated, or condensed. Whole buttermilk. Cream sauce or high-fat cheese sauce. Yogurt that is made from whole milk. Beverages Regular sodas and juice drinks with added sugar. Sweets and Desserts Frosting. Pudding. Cookies. Cakes other than angel food cake. Candy that has milk chocolate or white chocolate, hydrogenated fat, butter, coconut, or unknown ingredients. Buttered syrups. Full-fat ice cream or ice cream drinks. Fats and Oils Gravy that has suet, meat fat, or shortening. Cocoa butter, hydrogenated oils, palm oil, coconut oil, palm kernel oil. These can often be found in baked products, candy, fried foods, nondairy creamers, and whipped toppings. Solid fats and shortenings, including bacon fat, salt pork, lard, and butter. Nondairy cream substitutes, such as coffee creamers and sour cream substitutes. Salad dressings that are made of unknown oils, cheese, or sour cream. The items listed above may not be a complete list of foods and beverages to avoid. Contact your dietitian for more information.   This information is not intended to replace advice given to you by your health care provider. Make sure you discuss any questions you  have with your health care provider.   Document Released: 01/14/2012 Document Revised: 08/05/2014 Document Reviewed: 01/06/2014 Elsevier Interactive Patient Education [employer].

## 2016-03-01 ENCOUNTER — Other Ambulatory Visit: Payer: Self-pay | Admitting: Family Medicine

## 2016-03-01 DIAGNOSIS — K5909 Other constipation: Secondary | ICD-10-CM

## 2016-03-01 NOTE — Telephone Encounter (Signed)
Patient requesting refill of her Linzess.

## 2016-03-04 ENCOUNTER — Encounter: Payer: Self-pay | Admitting: Family Medicine

## 2016-03-04 ENCOUNTER — Ambulatory Visit (INDEPENDENT_AMBULATORY_CARE_PROVIDER_SITE_OTHER): Payer: BLUE CROSS/BLUE SHIELD | Admitting: Family Medicine

## 2016-03-04 VITALS — BP 130/78 | HR 76 | Temp 98.1°F | Resp 16 | Ht 64.0 in | Wt 252.8 lb

## 2016-03-04 DIAGNOSIS — I208 Other forms of angina pectoris: Secondary | ICD-10-CM | POA: Diagnosis not present

## 2016-03-04 DIAGNOSIS — E038 Other specified hypothyroidism: Secondary | ICD-10-CM | POA: Diagnosis not present

## 2016-03-04 DIAGNOSIS — M2550 Pain in unspecified joint: Secondary | ICD-10-CM | POA: Diagnosis not present

## 2016-03-04 DIAGNOSIS — E034 Atrophy of thyroid (acquired): Secondary | ICD-10-CM

## 2016-03-04 DIAGNOSIS — K5909 Other constipation: Secondary | ICD-10-CM

## 2016-03-04 DIAGNOSIS — J4520 Mild intermittent asthma, uncomplicated: Secondary | ICD-10-CM

## 2016-03-04 DIAGNOSIS — G8929 Other chronic pain: Secondary | ICD-10-CM

## 2016-03-04 DIAGNOSIS — F411 Generalized anxiety disorder: Secondary | ICD-10-CM | POA: Diagnosis not present

## 2016-03-04 DIAGNOSIS — G4700 Insomnia, unspecified: Secondary | ICD-10-CM | POA: Diagnosis not present

## 2016-03-04 DIAGNOSIS — K5900 Constipation, unspecified: Secondary | ICD-10-CM

## 2016-03-04 DIAGNOSIS — F331 Major depressive disorder, recurrent, moderate: Secondary | ICD-10-CM

## 2016-03-04 DIAGNOSIS — I10 Essential (primary) hypertension: Secondary | ICD-10-CM

## 2016-03-04 DIAGNOSIS — K219 Gastro-esophageal reflux disease without esophagitis: Secondary | ICD-10-CM | POA: Diagnosis not present

## 2016-03-04 MED ORDER — METOPROLOL SUCCINATE ER 25 MG PO TB24: 25.0000 mg | ORAL_TABLET | Freq: Every day | ORAL | 1 refills | Status: DC

## 2016-03-04 MED ORDER — MONTELUKAST SODIUM 10 MG PO TABS: 10.0000 mg | ORAL_TABLET | Freq: Every day | ORAL | 0 refills | Status: DC

## 2016-03-04 MED ORDER — DULOXETINE HCL 30 MG PO CPEP: 30.0000 mg | ORAL_CAPSULE | Freq: Every day | ORAL | 0 refills | Status: DC

## 2016-03-04 MED ORDER — LEVOTHYROXINE SODIUM 112 MCG PO TABS: 112.0000 ug | ORAL_TABLET | Freq: Every day | ORAL | 0 refills | Status: DC

## 2016-03-04 MED ORDER — AMLODIPINE-VALSARTAN-HCTZ 5-160-25 MG PO TABS: 1.0000 | ORAL_TABLET | Freq: Every day | ORAL | 0 refills | Status: DC

## 2016-03-04 MED ORDER — ALPRAZOLAM ER 0.5 MG PO TB24: 0.5000 mg | ORAL_TABLET | Freq: Every day | ORAL | 2 refills | Status: DC

## 2016-03-04 MED ORDER — DICLOFENAC SODIUM 75 MG PO TBEC: 75.0000 mg | DELAYED_RELEASE_TABLET | Freq: Two times a day (BID) | ORAL | 0 refills | Status: DC | PRN

## 2016-03-04 MED ORDER — OMEPRAZOLE 40 MG PO CPDR: 40.0000 mg | DELAYED_RELEASE_CAPSULE | Freq: Every day | ORAL | 1 refills | Status: DC

## 2016-03-04 MED ORDER — QUETIAPINE FUMARATE 25 MG PO TABS: 25.0000 mg | ORAL_TABLET | Freq: Every day | ORAL | 0 refills | Status: DC

## 2016-03-04 MED ORDER — LINACLOTIDE 145 MCG PO CAPS: ORAL_CAPSULE | ORAL | 2 refills | Status: DC

## 2016-03-04 NOTE — Patient Instructions (Signed)
Take half pill of Paxil ( paroxetine ) and one capsule of new prescription ( Cymbalta - Duloxetine ) 30 mg for one week, after that stop Paxil and take 2 capsules of Cymbalta.   New medications : Seroquel for Sleep                                Cymbalta for depression and anxiety

## 2016-03-04 NOTE — Progress Notes (Signed)
Name: Beverly Barrett   MRN: [id number]    DOB: Dec 08, 1960   Date:03/04/2016       Progress Note  Subjective  Chief Complaint  Chief Complaint  Patient presents with  . Follow-up    1 month F/U  . Constipation    Patient states she takes medication 7 a.m. and will go to bathroom around 2 hours later. Medication is working well for patient and going regularly with Linzess.   . Anxiety    Patient states Xanax XR is helping anxiety attacks, still has them but not as severe having them 1-3 x daily now.   . Hypothyroidism    Patient has ran out of medication and needs refill sent to pharmacy.  . Hypertension    Needs refill of Metoprolol    HPI   Major Depression/GAD: She has a long history of depression and has recently (2016) been placed on Paroxetine 40 mg in the morning and Depakote 250 mg DR one tablet in the evenings, however since Depakote was not helping we tried Buspar ( May 2017 ) but again no improvement of symptoms and she did not refill medication. She states she still cries daily, and occasionally has suicidal ideation but no planning ( she states she would not do it because of her grandson ). She complains of depressed mood, fatigue, feelings of worthlessness/guilt,  and insomnia. Family history significant for anxiety and depression. She has severe anxiety, difficulty interacting with others, she picks on her nails and makes herself bleed at times, constantly shaking her legs, restless, always worried and also snappy. Discussed psychiatric evaluation but she wants to hold off for now because of cost. We added Alprazolam XR 0.5 mg July 2017 and she has noticed some improvement in the anxiety level, but still has severe symptoms. Discussed changing medications, we will try Cymbalta and add Seroquel for her sleep at night. Unable to drive because of anxiety.   HTN: she taking her medication Exforge HCTZ and started on Toprol XL , July 2017. She has noticed that fluttering  sensation is almost gone since started on beta-blocker. No chest pain since last visit.   Angina: had evaluation by cardiologist and Echo myoview in July  normal result. She states chest pain has resolved since.   Constipation: she was started on Linzess July 2017 and states has bowel movements daily , no straining or blood in stools. No side effects of medications   Chronic joint pain involving lower back, hips, right greater than left knees. She is taking Diclofenac only prn now, discussed trying Tylenol instead because of GERD.  Advised to stop taking  Goody Powders - discussed risk of bleeding and also kidney disease  Hypothyroidism she is currently out of levothyroxine because mail order has not shipped it to her ( we will send it to local pharmacy - start with half pill for the first couple of weeks and take one daily after that ), she has chronic dry skin and constipation is now resolved with Linzess, last TSH at goal a few months ago  OSA: using CPAP every night , all night, but still feels tired during the day. Unable to sleep we will try Seroquel   Obesity: she lost a few lobs since lat visit  GERD: taking Omeprazole, still has heartburn , explained importance of stoppingGoody powders and Diclofenac. Continue omeprazole, symptoms are better since last visit   Patient Active Problem List   Diagnosis Date Noted  . GAD (generalized anxiety disorder) 12/20/2015  .  OSA on CPAP 12/20/2015  . Angina effort ([place]) 12/20/2015  . Hyperglycemia 09/22/2015  . History of colonic polyps   . Benign neoplasm of sigmoid colon   . Frequency 04/14/2015  . Midline low back pain without sciatica 04/14/2015  . Chronic pain of multiple joints 06/14/2014  . Dysmetabolic syndrome [id number]  . Acid reflux 06/14/2014  . Extreme obesity ([place]) 06/14/2014  . Arthritis, degenerative 06/14/2014  . Morbid obesity with BMI of 40.0-44.9, adult ([place]) 06/14/2014  . Hypothyroidism due to acquired atrophy  of thyroid 08/18/2013  . Hyperlipidemia 12/07/2010  . Hypertension goal BP (blood pressure) < 140/90 12/07/2010  . Familial multiple lipoprotein-type hyperlipidemia 12/07/2010  . CN (constipation) 11/21/2009  . Allergic rhinitis 10/13/2008  . Asthma, mild intermittent, well-controlled 09/01/2008  . Major depressive disorder, recurrent episode, in partial remission with anxious distress ([place]) 08/13/2007    Past Surgical History:  Procedure Laterality Date  . ABDOMINAL HYSTERECTOMY    . CESAREAN SECTION    . COLONOSCOPY WITH PROPOFOL N/A 05/29/2015   Procedure: COLONOSCOPY WITH PROPOFOL;  Surgeon: Lucilla Lame, MD;  Location: [facility];  Service: Endoscopy;  Laterality: N/A;  Latex allergy sleep apnea - no CPAP machine (yet)  . POLYPECTOMY  05/29/2015   Procedure: POLYPECTOMY;  Surgeon: Lucilla Lame, MD;  Location: [facility];  Service: Endoscopy;;    Family History  Problem Relation Age of Onset  . Diabetes Sister   . Hypertension Sister   . Cirrhosis Mother   . Cirrhosis Father   . Breast cancer Sister   . Heart attack Brother     Social History   Social History  . Marital status: Married    Spouse name: N/A  . Number of children: N/A  . Years of education: N/A   Occupational History  . Not on file.   Social History Main Topics  . Smoking status: Never Smoker  . Smokeless tobacco: Never Used  . Alcohol use No  . Drug use: No  . Sexual activity: Yes    Partners: Male   Other Topics Concern  . Not on file   Social History Narrative  . No narrative on file     Current Outpatient Prescriptions:  .  ALPRAZolam (XANAX XR) 0.5 MG 24 hr tablet, Take 1 tablet (0.5 mg total) by mouth daily., Disp: 30 tablet, Rfl: 2 .  Amlodipine-Valsartan-HCTZ 5-160-25 MG TABS, Take 1 tablet by mouth daily., Disp: 90 tablet, Rfl: 0 .  aspirin 81 MG EC tablet, Take 81 mg by mouth daily.  , Disp: , Rfl:  .  atorvastatin (LIPITOR) 40 MG tablet, Take 1 tablet (40 mg  total) by mouth at bedtime., Disp: 90 tablet, Rfl: 1 .  Azelastine HCl 0.15 % SOLN, Place 2 sprays into both nostrils 2 (two) times daily. , Disp: , Rfl: 3 .  diclofenac (VOLTAREN) 75 MG EC tablet, Take 1 tablet (75 mg total) by mouth every 12 (twelve) hours as needed., Disp: 180 tablet, Rfl: 0 .  EQ ALLERGY RELIEF 10 MG tablet, TAKE ONE TABLET BY MOUTH ONCE DAILY, Disp: 90 tablet, Rfl: 1 .  fluticasone (FLONASE) 50 MCG/ACT nasal spray, by Nasal route., Disp: , Rfl:  .  levothyroxine (SYNTHROID, LEVOTHROID) 112 MCG tablet, Take 1 tablet (112 mcg total) by mouth daily before breakfast., Disp: 90 tablet, Rfl: 0 .  linaclotide (LINZESS) 145 MCG CAPS capsule, TAKE ONE CAPSULE BY MOUTH ONCE DAILY BEFORE BREAKFAST, Disp: 30 capsule, Rfl: 2 .  metoprolol succinate (TOPROL XL) 25  MG 24 hr tablet, Take 1 tablet (25 mg total) by mouth daily., Disp: 90 tablet, Rfl: 1 .  montelukast (SINGULAIR) 10 MG tablet, Take 1 tablet (10 mg total) by mouth at bedtime., Disp: 90 tablet, Rfl: 0 .  nitroGLYCERIN (NITROSTAT) 0.4 MG SL tablet, Place 1 tablet (0.4 mg total) under the tongue every 5 (five) minutes as needed., Disp: 25 tablet, Rfl: 6 .  omeprazole (PRILOSEC) 40 MG capsule, Take 1 capsule (40 mg total) by mouth daily., Disp: 90 capsule, Rfl: 1 .  DULoxetine (CYMBALTA) 30 MG capsule, Take 1 capsule (30 mg total) by mouth daily. First week after that 2 capsules daily In place of Paxil, Disp: 60 capsule, Rfl: 0  Allergies  Allergen Reactions  . Latex     Pt unsure of reaction pt states something happened while in the hospital after surgery.     ROS  Constitutional: Negative for fever or weight change.  Respiratory: Positive  for cough and intermittent  shortness of breath. ( seen by ENT and will go back for allergy testing )  Cardiovascular: Negative for chest pain or palpitations.  Gastrointestinal: Negative for abdominal pain, no bowel changes.  Musculoskeletal: Negative for gait problem or joint swelling.   Skin: Negative for rash.  Neurological: Negative for dizziness or headache.  No other specific complaints in a complete review of systems (except as listed in HPI above).  Objective  Vitals:   03/04/16 1202  BP: 130/78  Pulse: 76  Resp: 16  Temp: 98.1 F (36.7 C)  TempSrc: Oral  SpO2: 95%  Weight: 252 lb 12.8 oz (114.7 kg)  Height: 5\' 4"  (1.626 m)    Body mass index is 43.39 kg/m.  Physical Exam  Constitutional: Patient appears well-developed and well-nourished. Obese  No distress.  HEENT: head atraumatic, normocephalic, pupils equal and reactive to light,  neck supple, throat within normal limits Cardiovascular: Normal rate, regular rhythm and normal heart sounds.  No murmur heard. No BLE edema. Pulmonary/Chest: Effort normal and breath sounds normal. No respiratory distress. Abdominal: Soft.  There is no tenderness. Psychiatric: Patient has a normal mood and affect. She is fidgety, shaking her legs, Judgment and thought content normal.  Recent Results (from the past 2160 hour(s))  Urine culture     Status: None   Collection Time: 12/20/15 12:00 AM  Result Value Ref Range   Urine Culture, Routine Final report    Urine Culture result 1 Comment     Comment: Mixed urogenital flora 10,000-25,000 colony forming units per mL   Lipid panel     Status: Abnormal   Collection Time: 12/20/15 10:12 AM  Result Value Ref Range   Cholesterol, Total 192 100 - 199 mg/dL   Triglycerides 215 (H) 0 - 149 mg/dL   HDL 46 >39 mg/dL   VLDL Cholesterol Cal 43 (H) 5 - 40 mg/dL   LDL Calculated 103 (H) 0 - 99 mg/dL   Chol/HDL Ratio 4.2 0.0 - 4.4 ratio units    Comment:                                   T. Chol/HDL Ratio                                             Men  Women  1/2 Avg.Risk  3.4    3.3                                   Avg.Risk  5.0    4.4                                2X Avg.Risk  9.6    7.1                                3X Avg.Risk 23.4    11.0   Comprehensive metabolic panel     Status: Abnormal   Collection Time: 12/20/15 10:12 AM  Result Value Ref Range   Glucose 105 (H) 65 - 99 mg/dL   BUN 17 6 - 24 mg/dL   Creatinine, Ser 0.73 0.57 - 1.00 mg/dL   GFR calc non Af Amer 92 >59 mL/min/1.73   GFR calc Af Amer 106 >59 mL/min/1.73   BUN/Creatinine Ratio 23 9 - 23   Sodium 144 134 - 144 mmol/L   Potassium 3.8 3.5 - 5.2 mmol/L   Chloride 98 96 - 106 mmol/L   CO2 25 18 - 29 mmol/L   Calcium 9.4 8.7 - 10.2 mg/dL   Total Protein 7.2 6.0 - 8.5 g/dL   Albumin 4.3 3.5 - 5.5 g/dL   Globulin, Total 2.9 1.5 - 4.5 g/dL   Albumin/Globulin Ratio 1.5 1.2 - 2.2   Bilirubin Total 0.2 0.0 - 1.2 mg/dL   Alkaline Phosphatase 61 39 - 117 IU/L   AST 19 0 - 40 IU/L   ALT 18 0 - 32 IU/L  Hemoglobin A1c     Status: Abnormal   Collection Time: 12/20/15 10:12 AM  Result Value Ref Range   Hgb A1c MFr Bld 6.3 (H) 4.8 - 5.6 %    Comment:          Pre-diabetes: 5.7 - 6.4          Diabetes: >6.4          Glycemic control for adults with diabetes: <7.0    Est. average glucose Bld gHb Est-mCnc 134 mg/dL  TSH     Status: None   Collection Time: 12/20/15 10:12 AM  Result Value Ref Range   TSH 1.170 0.450 - 4.500 uIU/mL  NM Myocar Multi W/Spect W/Wall Motion / EF     Status: None   Collection Time: 01/23/16 10:56 AM  Result Value Ref Range   Rest HR 80 bpm   Rest BP 128/68 mmHg   Exercise duration (min)  min   Exercise duration (sec)  sec   Estimated workload 1.0 METS   Peak HR 104 BPM   Peak BP  mmHg   MPHR  bpm   Percent HR 65 %   RPE     LV sys vol 36 mL   TID 0.81    LV dias vol 94 46 - 106 mL   LHR     SSS 0    SRS 0    SDS 0    Phase 1 name PREINFSN    Stage 1 Name SUPINE    Stage 1 Time 00:00:02    Stage 1 HR 84 bpm   Phase 2 Name [place]    Stage 2 Name HYPERV.    Stage  2 Time 00:01:07    Stage 2 Speed 0.0 mph   Stage 2 Grade 0.0 %   Stage 2 HR 83 bpm   Phase 3 Name Infusion    Stage 3 Name DOSE 1    Stage 3  Attribute Baseline    Stage 3 Time 00:00:01    Stage 3 Speed 0.0 mph   Stage 3 Grade 0.0 %   Stage 3 HR 83 bpm   Phase 4 Name Infusion    Stage 4 Name DOSE 1    Stage 4 Attribute Peak    Stage 4 Time 00:01:01    Stage 4 Speed 0.0 mph   Stage 4 Grade 0.0 %   Stage 4 HR 104 bpm   Phase 5 Name POSTINFSN    Stage 5 Attribute Recovery 9min    Stage 5 Time 00:01:00    Stage 5 Speed 0.0 mph   Stage 5 Grade 0.0 %   Stage 5 HR 103 bpm   Phase 6 Name POSTINFSN    Stage 6 Time 00:03:59    Stage 6 Speed 0.0 mph   Stage 6 Grade 0.0 %   Stage 6 HR 93 bpm   Stage 6 SBP 149 mmHg   Stage 6 DBP 67 mmHg   Percent of predicted max HR 63 %  ECHOCARDIOGRAM COMPLETE     Status: Abnormal   Collection Time: 01/31/16 11:29 AM  Result Value Ref Range   LV PW d 8.3 (A) 0.6 - 1.1 mm   FS 42 28 - 44 %   LA vol 63.2 mL   LA ID, A-P, ES 40 mm   IVS/LV PW RATIO, ED 1.08    Stroke v 48 ml   LVOT VTI 19.9 cm   LV e' LATERAL 9.03 cm/s   LV E/e' medial 10.8    LV E/e'average 10.8    LA diam index 1.71 cm/m2   LA vol A4C 77 ml   E decel time 261 msec   LVOT diameter 21 mm   LVOT area 3.46 cm2   LVOT peak vel 102 cm/s   LVOT SV 69 mL   Peak grad 4 mmHg   E/e' ratio 10.8    MV pk E vel 97.5 m/s   MV pk A vel 84.9 m/s   LV sys vol 27 14 - 42 mL   LV sys vol index 11 mL/m2   LV dias vol 75 46 - 106 mL   LV dias vol index 32 mL/m2   LA vol index 27 mL/m2   MV Dec 261    LA diam end sys 40 mm   Simpson's disk 64    TDI e' medial 10.3    TDI e' lateral 9.03    TAPSE 29.6 mm      PHQ2/9: Depression screen [facility] 2/9 03/04/2016 02/02/2016 09/22/2015 06/16/2015 04/14/2015  Decreased Interest 2 3 1  0 1  Down, Depressed, Hopeless 3 3 1 1 3   PHQ - 2 Score 5 6 2 1 4   Altered sleeping 3 3 0 - 3  Tired, decreased energy 3 3 1  - 2  Change in appetite 2 0 0 - 2  Feeling bad or failure about yourself  3 3 1  - 2  Trouble concentrating 0 3 0 - 2  Moving slowly or fidgety/restless 0 2 0 - 1  Suicidal thoughts  2 2 1  - 1  PHQ-9 Score 18 22 5  - 17  Difficult doing work/chores Extremely dIfficult Very  difficult - - Very difficult    Fall Risk: Fall Risk  02/02/2016 09/22/2015 06/16/2015 04/14/2015  Falls in the past year? No No No Yes  Number falls in past yr: - - - 1  Injury with Fall? - - - Yes     Assessment & Plan  1. Depression, major, recurrent, moderate ([place])  We will try changing to Cymbalta   2. GAD (generalized anxiety disorder)  Improved, but still very symptomatic, however she refuses seeing Psychiatrist - metoprolol succinate (TOPROL XL) 25 MG 24 hr tablet; Take 1 tablet (25 mg total) by mouth daily.  Dispense: 90 tablet; Refill: 1 - ALPRAZolam (XANAX XR) 0.5 MG 24 hr tablet; Take 1 tablet (0.5 mg total) by mouth daily.  Dispense: 30 tablet; Refill: 2 - DULoxetine (CYMBALTA) 30 MG capsule; Take 1 capsule (30 mg total) by mouth daily. First week after that 2 capsules daily In place of Paxil  Dispense: 60 capsule; Refill: 0  3. Angina effort ([facility])  - metoprolol succinate (TOPROL XL) 25 MG 24 hr tablet; Take 1 tablet (25 mg total) by mouth daily.  Dispense: 90 tablet; Refill: 1  4. Chronic constipation  - linaclotide (LINZESS) 145 MCG CAPS capsule; TAKE ONE CAPSULE BY MOUTH ONCE DAILY BEFORE BREAKFAST  Dispense: 30 capsule; Refill: 2  5. Hypertension goal BP (blood pressure) < 140/90  - Amlodipine-Valsartan-HCTZ 5-160-25 MG TABS; Take 1 tablet by mouth daily.  Dispense: 90 tablet; Refill: 0  6. Hypothyroidism due to acquired atrophy of thyroid  - levothyroxine (SYNTHROID, LEVOTHROID) 112 MCG tablet; Take 1 tablet (112 mcg total) by mouth daily before breakfast.  Dispense: 90 tablet; Refill: 0   7. Asthma, mild intermittent, well-controlled  Discussed spirometry but she would like to wait until next visit  - montelukast (SINGULAIR) 10 MG tablet; Take 1 tablet (10 mg total) by mouth at bedtime.  Dispense: 90 tablet; Refill: 0  8. Chronic pain of multiple joints  -  diclofenac (VOLTAREN) 75 MG EC tablet; Take 1 tablet (75 mg total) by mouth every 12 (twelve) hours as needed.  Dispense: 180 tablet; Refill: 0 - DULoxetine (CYMBALTA) 30 MG capsule; Take 1 capsule (30 mg total) by mouth daily. First week after that 2 capsules daily In place of Paxil  Dispense: 60 capsule; Refill: 0  9. Gastroesophageal reflux disease without esophagitis  - omeprazole (PRILOSEC) 40 MG capsule; Take 1 capsule (40 mg total) by mouth daily.  Dispense: 90 capsule; Refill: 1  10. Insomnia  - QUEtiapine (SEROQUEL) 25 MG tablet; Take 1 tablet (25 mg total) by mouth at bedtime.  Dispense: 30 tablet; Refill: 0

## 2016-03-28 ENCOUNTER — Other Ambulatory Visit: Payer: Self-pay | Admitting: Family Medicine

## 2016-03-28 DIAGNOSIS — Z1231 Encounter for screening mammogram for malignant neoplasm of breast: Secondary | ICD-10-CM

## 2016-04-08 ENCOUNTER — Encounter: Payer: Self-pay | Admitting: Family Medicine

## 2016-04-08 ENCOUNTER — Ambulatory Visit (INDEPENDENT_AMBULATORY_CARE_PROVIDER_SITE_OTHER): Payer: BLUE CROSS/BLUE SHIELD | Admitting: Family Medicine

## 2016-04-08 VITALS — BP 118/78 | HR 87 | Temp 98.5°F | Resp 18 | Ht 64.0 in | Wt 251.6 lb

## 2016-04-08 DIAGNOSIS — M2550 Pain in unspecified joint: Secondary | ICD-10-CM

## 2016-04-08 DIAGNOSIS — G8929 Other chronic pain: Secondary | ICD-10-CM

## 2016-04-08 DIAGNOSIS — G4700 Insomnia, unspecified: Secondary | ICD-10-CM | POA: Diagnosis not present

## 2016-04-08 DIAGNOSIS — Z23 Encounter for immunization: Secondary | ICD-10-CM

## 2016-04-08 DIAGNOSIS — F331 Major depressive disorder, recurrent, moderate: Secondary | ICD-10-CM

## 2016-04-08 DIAGNOSIS — F411 Generalized anxiety disorder: Secondary | ICD-10-CM | POA: Diagnosis not present

## 2016-04-08 MED ORDER — DULOXETINE HCL 60 MG PO CPEP: 60.0000 mg | ORAL_CAPSULE | Freq: Every day | ORAL | 0 refills | Status: DC

## 2016-04-08 MED ORDER — QUETIAPINE FUMARATE 25 MG PO TABS: 25.0000 mg | ORAL_TABLET | Freq: Every day | ORAL | 1 refills | Status: DC

## 2016-04-08 NOTE — Patient Instructions (Signed)
Suicidal Feelings: How to Help Yourself Suicide is the taking of one's own life. If you feel as though life is getting too tough to handle and are thinking about suicide, get help right away. To get help:  Call your local emergency services (911 in the [place]  Call a suicide hotline to speak with a trained counselor who understands how you are feeling. The following is a list of suicide hotlines in the [place]. For a list of hotlines in [place], visit FindSkins.pl.  1-800-273-TALK 512-489-7372).  1-800-SUICIDE 360 094 0091).  581-541-3158. This is a hotline for Spanish speakers.  E7576207 (941) 516-9024). This is a hotline for TTY users.  1-866-4-U-TREVOR (940) 157-8981). This is a hotline for lesbian, gay, bisexual, transgender, or questioning youth.  Contact a crisis center or a local suicide prevention center. To find a crisis center or suicide prevention center:  Call your local hospital, clinic, community service organization, mental health center, social service provider, or health department. Ask for assistance in connecting to a crisis center.  Visit BankingRep.com.au for a list of crisis centers in the [place], or visit www.suicideprevention.ca/thinking-about-suicide/find-a-crisis-centre for a list of centers in [place].  Visit the following websites:  National Suicide Prevention Lifeline: www.suicidepreventionlifeline.org  Hopeline: www.hopeline.[place] for Suicide Prevention: PromotionalLoans.co.za  [employer] (for lesbian, gay, bisexual, transgender, or questioning youth): www.thetrevorproject.org HOW CAN I HELP MYSELF FEEL BETTER?  Promise yourself that you will not do anything drastic when you have suicidal feelings. Remember, there is hope. Many people have gotten through suicidal thoughts and feelings, and you will, too. You may  have gotten through them before, and this proves that you can get through them again.  Let family, friends, teachers, or counselors know how you are feeling. Try not to isolate yourself from those who care about you. Remember, they will want to help you. Talk with someone every day, even if you do not feel sociable. Face-to-face conversation is best.  Call a mental health professional and see one regularly.  Visit your primary health care provider every year.  Eat a well-balanced diet, and space your meals so you eat regularly.  Get plenty of rest.  Avoid alcohol and drugs, and remove them from your home. They will only make you feel worse.  If you are thinking of taking a lot of medicine, give your medicine to someone who can give it to you one day at a time. If you are on antidepressants and are concerned you will overdose, let your health care provider know so he or she can give you safer medicines. Ask your mental health professional about the possible side effects of any medicines you are taking.  Remove weapons, poisons, knives, and anything else that could harm you from your home.  Try to stick to routines. Follow a schedule every day. Put self-care on your schedule.  Make a list of realistic goals, and cross them off when you achieve them. Accomplishments give a sense of worth.  Wait until you are feeling better before doing the things you find difficult or unpleasant.  Exercise if you are able. You will feel better if you exercise for even a half hour each day.  Go out in the sun or into nature. This will help you recover from depression faster. If you have a favorite place to walk, go there.  Do the things that have always given you pleasure. Play your favorite music, read a good book, paint a picture, play your favorite instrument, or do anything  else that takes your mind off your depression if it is safe to do.  Keep your living space well lit.  When you are feeling well,  write yourself a letter about tips and support that you can read when you are not feeling well.  Remember that life's difficulties can be sorted out with help. Conditions can be treated. You can work on thoughts and strategies that serve you well.   This information is not intended to replace advice given to you by your health care provider. Make sure you discuss any questions you have with your health care provider.   Document Released: 01/19/2003 Document Revised: 08/05/2014 Document Reviewed: 11/09/2013 Elsevier Interactive Patient Education 2016 Elsevier Inc. Major Depressive Disorder Major depressive disorder is a mental illness. It also may be called clinical depression or unipolar depression. Major depressive disorder usually causes feelings of sadness, hopelessness, or helplessness. Some people with this disorder do not feel particularly sad but lose interest in doing things they used to enjoy (anhedonia). Major depressive disorder also can cause physical symptoms. It can interfere with work, school, relationships, and other normal everyday activities. The disorder varies in severity but is longer lasting and more serious than the sadness we all feel from time to time in our lives. Major depressive disorder often is triggered by stressful life events or major life changes. Examples of these triggers include divorce, loss of your job or home, a move, and the death of a family member or close friend. Sometimes this disorder occurs for no obvious reason at all. People who have family members with major depressive disorder or bipolar disorder are at higher risk for developing this disorder, with or without life stressors. Major depressive disorder can occur at any age. It may occur just once in your life (single episode major depressive disorder). It may occur multiple times (recurrent major depressive disorder). SYMPTOMS People with major depressive disorder have either anhedonia or depressed mood  on nearly a daily basis for at least 2 weeks or longer. Symptoms of depressed mood include:  Feelings of sadness (blue or down in the dumps) or emptiness.  Feelings of hopelessness or helplessness.  Tearfulness or episodes of crying (may be observed by others).  Irritability (children and adolescents). In addition to depressed mood or anhedonia or both, people with this disorder have at least four of the following symptoms:  Difficulty sleeping or sleeping too much.   Significant change (increase or decrease) in appetite or weight.   Lack of energy or motivation.  Feelings of guilt and worthlessness.   Difficulty concentrating, remembering, or making decisions.  Unusually slow movement (psychomotor retardation) or restlessness (as observed by others).   Recurrent wishes for death, recurrent thoughts of self-harm (suicide), or a suicide attempt. People with major depressive disorder commonly have persistent negative thoughts about themselves, other people, and the world. People with severe major depressive disorder may experiencedistorted beliefs or perceptions about the world (psychotic delusions). They also may see or hear things that are not real (psychotic hallucinations). DIAGNOSIS Major depressive disorder is diagnosed through an assessment by your health care provider. Your health care provider will ask aboutaspects of your daily life, such as mood,sleep, and appetite, to see if you have the diagnostic symptoms of major depressive disorder. Your health care provider may ask about your medical history and use of alcohol or drugs, including prescription medicines. Your health care provider also may do a physical exam and blood work. This is because certain medical conditions and the  use of certain substances can cause major depressive disorder-like symptoms (secondary depression). Your health care provider also may refer you to a mental health specialist for further evaluation  and treatment. TREATMENT It is important to recognize the symptoms of major depressive disorder and seek treatment. The following treatments can be prescribed for this disorder:   Medicine. Antidepressant medicines usually are prescribed. Antidepressant medicines are thought to correct chemical imbalances in the brain that are commonly associated with major depressive disorder. Other types of medicine may be added if the symptoms do not respond to antidepressant medicines alone or if psychotic delusions or hallucinations occur.  Talk therapy. Talk therapy can be helpful in treating major depressive disorder by providing support, education, and guidance. Certain types of talk therapy also can help with negative thinking (cognitive behavioral therapy) and with relationship issues that trigger this disorder (interpersonal therapy). A mental health specialist can help determine which treatment is best for you. Most people with major depressive disorder do well with a combination of medicine and talk therapy. Treatments involving electrical stimulation of the brain can be used in situations with extremely severe symptoms or when medicine and talk therapy do not work over time. These treatments include electroconvulsive therapy, transcranial magnetic stimulation, and vagal nerve stimulation.   This information is not intended to replace advice given to you by your health care provider. Make sure you discuss any questions you have with your health care provider.   Document Released: 11/09/2012 Document Revised: 08/05/2014 Document Reviewed: 11/09/2012 Elsevier Interactive Patient Education [employer].

## 2016-04-08 NOTE — Progress Notes (Addendum)
Name: Beverly Barrett   MRN: [id number]    DOB: [birth year]   Date:04/08/2016       Progress Note  Subjective  Chief Complaint  Chief Complaint  Patient presents with  . Depression    1 month follow up    HPI  Major Depression/GAD: She has a long history of depression and has recently (2016) been placed on Paroxetine 40 mg in the morning and Depakote 250 mg DR one tablet in the evenings, however since Depakote was not helping we tried Buspar ( May 2017 ) but again no improvement of symptoms and she did not refill medication.We added Alprazolam XR 0.5 mg July 2017 and she has noticed some improvement in the anxiety level, but still has severe symptoms, we added Cymbalta 02/2016 and she states no longer crying daily ( still crying once or twice weekly ), and still has occasionally has suicidal ideation but no planning ( she states she would not do it because of her grandson ) - she feels guilty about not contributing financially to her family and also because her health care is so expensive, so she feels like her family would be better off without her. She complains of depressed mood, fatigue, feelings of worthlessness/guilt, and insomnia. Family history significant for anxiety and depression, one of her brother's attempted suicide many years ago. She has severe anxiety, difficulty interacting with others, she picks on her nails and makes herself bleed at times, constantly shaking her legs, restless, always worried and also snappy. Discussed psychiatric evaluation but she wants to hold off for now because of cost. She does not have a good sleep routine, goes to bed late tries to stay up to see her daughter when she comes home around 2am for her lunch break ( her daughter works 3rd shift). She seems to be contemplating suicide again, explained ways to call suicide hotline and importance of seeing psychiatrist but she refuses to do so. She denies weapons in her house.   Patient Active Problem List    Diagnosis Date Noted  . GAD (generalized anxiety disorder) 12/20/2015  . OSA on CPAP 12/20/2015  . Angina effort ([place]) 12/20/2015  . Hyperglycemia 09/22/2015  . History of colonic polyps   . Benign neoplasm of sigmoid colon   . Frequency 04/14/2015  . Midline low back pain without sciatica 04/14/2015  . Chronic pain of multiple joints 06/14/2014  . Dysmetabolic syndrome [id number]  . Acid reflux 06/14/2014  . Extreme obesity ([place]) 06/14/2014  . Arthritis, degenerative 06/14/2014  . Morbid obesity with BMI of 40.0-44.9, adult ([place]) 06/14/2014  . Hypothyroidism due to acquired atrophy of thyroid 08/18/2013  . Hyperlipidemia 12/07/2010  . Hypertension goal BP (blood pressure) < 140/90 12/07/2010  . Familial multiple lipoprotein-type hyperlipidemia 12/07/2010  . CN (constipation) 11/21/2009  . Allergic rhinitis 10/13/2008  . Asthma, mild intermittent, well-controlled 09/01/2008  . Major depressive disorder, recurrent episode, in partial remission with anxious distress ([place]) 08/13/2007    Past Surgical History:  Procedure Laterality Date  . ABDOMINAL HYSTERECTOMY    . CESAREAN SECTION    . COLONOSCOPY WITH PROPOFOL N/A 05/29/2015   Procedure: COLONOSCOPY WITH PROPOFOL;  Surgeon: Lucilla Lame, MD;  Location: [facility];  Service: Endoscopy;  Laterality: N/A;  Latex allergy sleep apnea - no CPAP machine (yet)  . POLYPECTOMY  05/29/2015   Procedure: POLYPECTOMY;  Surgeon: Lucilla Lame, MD;  Location: [facility];  Service: Endoscopy;;    Family History  Problem Relation Age of Onset  .  Diabetes Sister   . Hypertension Sister   . Cirrhosis Mother   . Cirrhosis Father   . Breast cancer Sister   . Heart attack Brother     Social History   Social History  . Marital status: Married    Spouse name: N/A  . Number of children: N/A  . Years of education: N/A   Occupational History  . Not on file.   Social History Main Topics  . Smoking status: Never Smoker  .  Smokeless tobacco: Never Used  . Alcohol use No  . Drug use: No  . Sexual activity: Yes    Partners: Male   Other Topics Concern  . Not on file   Social History Narrative  . No narrative on file     Current Outpatient Prescriptions:  .  ALPRAZolam (XANAX XR) 0.5 MG 24 hr tablet, Take 1 tablet (0.5 mg total) by mouth daily., Disp: 30 tablet, Rfl: 2 .  Amlodipine-Valsartan-HCTZ 5-160-25 MG TABS, Take 1 tablet by mouth daily., Disp: 90 tablet, Rfl: 0 .  aspirin 81 MG EC tablet, Take 81 mg by mouth daily.  , Disp: , Rfl:  .  atorvastatin (LIPITOR) 40 MG tablet, Take 1 tablet (40 mg total) by mouth at bedtime., Disp: 90 tablet, Rfl: 1 .  Azelastine HCl 0.15 % SOLN, Place 2 sprays into both nostrils 2 (two) times daily. , Disp: , Rfl: 3 .  diclofenac (VOLTAREN) 75 MG EC tablet, Take 1 tablet (75 mg total) by mouth every 12 (twelve) hours as needed., Disp: 180 tablet, Rfl: 0 .  EQ ALLERGY RELIEF 10 MG tablet, TAKE ONE TABLET BY MOUTH ONCE DAILY, Disp: 90 tablet, Rfl: 1 .  fluticasone (FLONASE) 50 MCG/ACT nasal spray, by Nasal route., Disp: , Rfl:  .  levothyroxine (SYNTHROID, LEVOTHROID) 112 MCG tablet, Take 1 tablet (112 mcg total) by mouth daily before breakfast., Disp: 90 tablet, Rfl: 0 .  linaclotide (LINZESS) 145 MCG CAPS capsule, TAKE ONE CAPSULE BY MOUTH ONCE DAILY BEFORE BREAKFAST, Disp: 30 capsule, Rfl: 2 .  metoprolol succinate (TOPROL XL) 25 MG 24 hr tablet, Take 1 tablet (25 mg total) by mouth daily., Disp: 90 tablet, Rfl: 1 .  montelukast (SINGULAIR) 10 MG tablet, Take 1 tablet (10 mg total) by mouth at bedtime., Disp: 90 tablet, Rfl: 0 .  nitroGLYCERIN (NITROSTAT) 0.4 MG SL tablet, Place 1 tablet (0.4 mg total) under the tongue every 5 (five) minutes as needed., Disp: 25 tablet, Rfl: 6 .  omeprazole (PRILOSEC) 40 MG capsule, Take 1 capsule (40 mg total) by mouth daily., Disp: 90 capsule, Rfl: 1 .  DULoxetine (CYMBALTA) 60 MG capsule, Take 1 capsule (60 mg total) by mouth daily.  First week after that 2 capsules daily In place of Paxil, Disp: 90 capsule, Rfl: 0 .  QUEtiapine (SEROQUEL) 25 MG tablet, Take 1 tablet (25 mg total) by mouth at bedtime., Disp: 90 tablet, Rfl: 1  Allergies  Allergen Reactions  . Latex     Pt unsure of reaction pt states something happened while in the hospital after surgery.     ROS  Constitutional: Negative for fever or significant  weight change.  Respiratory: Negative for cough and shortness of breath.   Cardiovascular: Negative for chest pain or palpitations.  Gastrointestinal: Negative for abdominal pain, no bowel changes.  Musculoskeletal: Negative for gait problem or joint swelling.  Skin: Negative for rash.  Neurological: Negative for dizziness or headache.  No other specific complaints in a complete review  of systems (except as listed in HPI above).  Objective  Vitals:   04/08/16 1015  BP: 118/78  Pulse: 87  Resp: 18  Temp: 98.5 F (36.9 C)  SpO2: 94%  Weight: 251 lb 9 oz (114.1 kg)  Height: 5\' 4"  (1.626 m)    Body mass index is 43.18 kg/m.  Physical Exam  Constitutional: Patient appears well-developed and well-nourished. Obese No distress.  HEENT: head atraumatic, normocephalic, pupils equal and reactive to light,neck supple, throat within normal limits Cardiovascular: Normal rate, regular rhythm and normal heart sounds.  No murmur heard. No BLE edema. Pulmonary/Chest: Effort normal and breath sounds normal. No respiratory distress. Abdominal: Soft.  There is no tenderness. Psychiatric: Patient still depressed. Good eye contact, tearful, still shaking her legs but not as intense, fidgety with her hands.   Recent Results (from the past 2160 hour(s))  NM Myocar Multi W/Spect W/Wall Motion / EF     Status: None   Collection Time: 01/23/16 10:56 AM  Result Value Ref Range   Rest HR 80 bpm   Rest BP 128/68 mmHg   Exercise duration (min)  min   Exercise duration (sec)  sec   Estimated workload 1.0 METS    Peak HR 104 BPM   Peak BP  mmHg   MPHR  bpm   Percent HR 65 %   RPE     LV sys vol 36 mL   TID 0.81    LV dias vol 94 46 - 106 mL   LHR     SSS 0    SRS 0    SDS 0    Phase 1 name PREINFSN    Stage 1 Name SUPINE    Stage 1 Time 00:00:02    Stage 1 HR 84 bpm   Phase 2 Name [place]    Stage 2 Name HYPERV.    Stage 2 Time 00:01:07    Stage 2 Speed 0.0 mph   Stage 2 Grade 0.0 %   Stage 2 HR 83 bpm   Phase 3 Name Infusion    Stage 3 Name DOSE 1    Stage 3 Attribute Baseline    Stage 3 Time 00:00:01    Stage 3 Speed 0.0 mph   Stage 3 Grade 0.0 %   Stage 3 HR 83 bpm   Phase 4 Name Infusion    Stage 4 Name DOSE 1    Stage 4 Attribute Peak    Stage 4 Time 00:01:01    Stage 4 Speed 0.0 mph   Stage 4 Grade 0.0 %   Stage 4 HR 104 bpm   Phase 5 Name POSTINFSN    Stage 5 Attribute Recovery 77min    Stage 5 Time 00:01:00    Stage 5 Speed 0.0 mph   Stage 5 Grade 0.0 %   Stage 5 HR 103 bpm   Phase 6 Name POSTINFSN    Stage 6 Time 00:03:59    Stage 6 Speed 0.0 mph   Stage 6 Grade 0.0 %   Stage 6 HR 93 bpm   Stage 6 SBP 149 mmHg   Stage 6 DBP 67 mmHg   Percent of predicted max HR 63 %  ECHOCARDIOGRAM COMPLETE     Status: Abnormal   Collection Time: 01/31/16 11:29 AM  Result Value Ref Range   LV PW d 8.3 (A) 0.6 - 1.1 mm   FS 42 28 - 44 %   LA vol 63.2 mL   LA ID, A-P,  ES 40 mm   IVS/LV PW RATIO, ED 1.08    Stroke v 48 ml   LVOT VTI 19.9 cm   LV e' LATERAL 9.03 cm/s   LV E/e' medial 10.8    LV E/e'average 10.8    LA diam index 1.71 cm/m2   LA vol A4C 77 ml   E decel time 261 msec   LVOT diameter 21 mm   LVOT area 3.46 cm2   LVOT peak vel 102 cm/s   LVOT SV 69 mL   Peak grad 4 mmHg   E/e' ratio 10.8    MV pk E vel 97.5 m/s   MV pk A vel 84.9 m/s   LV sys vol 27 14 - 42 mL   LV sys vol index 11 mL/m2   LV dias vol 75 46 - 106 mL   LV dias vol index 32 mL/m2   LA vol index 27 mL/m2   MV Dec 261    LA diam end sys 40 mm   Simpson's disk 64    TDI e' medial  10.3    TDI e' lateral 9.03    TAPSE 29.6 mm     PHQ2/9: Depression screen [facility] 2/9 04/08/2016 03/04/2016 02/02/2016 09/22/2015 06/16/2015  Decreased Interest 1 2 3 1  0  Down, Depressed, Hopeless 1 3 3 1 1   PHQ - 2 Score 2 5 6 2 1   Altered sleeping 0 3 3 0 -  Tired, decreased energy 0 3 3 1  -  Change in appetite 1 2 0 0 -  Feeling bad or failure about yourself  1 3 3 1  -  Trouble concentrating 0 0 3 0 -  Moving slowly or fidgety/restless 0 0 2 0 -  Suicidal thoughts 0 2 2 1  -  PHQ-9 Score 4 18 22 5  -  Difficult doing work/chores - Extremely dIfficult Very difficult - -     Fall Risk: Fall Risk  04/08/2016 02/02/2016 09/22/2015 06/16/2015 04/14/2015  Falls in the past year? Yes No No No Yes  Number falls in past yr: 1 - - - 1  Injury with Fall? No - - - Yes      Assessment & Plan  1. Depression, major, recurrent, moderate (HCC)   - DULoxetine (CYMBALTA) 60 MG capsule; Take 1 capsule (60 mg total) by mouth daily. First week after that 2 capsules daily In place of Paxil  Dispense: 90 capsule; Refill: 0 Advised her to come in sooner but she would like to hold off because of cost.  Discussed again suicide hotline and will give her numbers today 2. GAD (generalized anxiety disorder)  - DULoxetine (CYMBALTA) 60 MG capsule; Take 1 capsule (60 mg total) by mouth daily. First week after that 2 capsules daily In place of Paxil  Dispense: 90 capsule; Refill: 0  3. Insomnia  - QUEtiapine (SEROQUEL) 25 MG tablet; Take 1 tablet (25 mg total) by mouth at bedtime.  Dispense: 90 tablet; Refill: 1  4. Chronic pain of multiple joints  She still has daily pain, she is has been out of medication for a few days, but cymbalta has helped with symptoms.  - DULoxetine (CYMBALTA) 60 MG capsule; Take 1 capsule (60 mg total) by mouth daily.  In place of Paxil  Dispense: 90 capsule; Refill: 0   5. Needs flu shot  - Flu Vaccine QUAD 36+ mos IM

## 2016-04-08 NOTE — Addendum Note (Signed)
Addended by: Steele Sizer F on: 04/08/2016 11:00 AM   Modules accepted: Orders

## 2016-04-23 ENCOUNTER — Ambulatory Visit
Admission: RE | Admit: 2016-04-23 | Discharge: 2016-04-23 | Disposition: A | Discharge status: Home / Self Care | Payer: BLUE CROSS/BLUE SHIELD | Source: Ambulatory Visit | Attending: Family Medicine | Admitting: Family Medicine

## 2016-04-23 ENCOUNTER — Other Ambulatory Visit: Payer: Self-pay | Admitting: Family Medicine

## 2016-04-23 DIAGNOSIS — Z1231 Encounter for screening mammogram for malignant neoplasm of breast: Secondary | ICD-10-CM | POA: Diagnosis present

## 2016-05-29 ENCOUNTER — Other Ambulatory Visit: Payer: Self-pay

## 2016-05-29 DIAGNOSIS — E034 Atrophy of thyroid (acquired): Secondary | ICD-10-CM

## 2016-05-29 DIAGNOSIS — I10 Essential (primary) hypertension: Secondary | ICD-10-CM

## 2016-05-29 DIAGNOSIS — E785 Hyperlipidemia, unspecified: Secondary | ICD-10-CM

## 2016-05-29 NOTE — Telephone Encounter (Signed)
Patient requesting refill of Atorvastatin, Amlod/HCTZ, Levothyroxine to [facility].

## 2016-05-30 MED ORDER — ATORVASTATIN CALCIUM 40 MG PO TABS: 40.0000 mg | ORAL_TABLET | Freq: Every day | ORAL | 0 refills | Status: DC

## 2016-05-30 MED ORDER — LEVOTHYROXINE SODIUM 112 MCG PO TABS: 112.0000 ug | ORAL_TABLET | Freq: Every day | ORAL | 0 refills | Status: DC

## 2016-05-30 MED ORDER — AMLODIPINE-VALSARTAN-HCTZ 5-160-25 MG PO TABS: 1.0000 | ORAL_TABLET | Freq: Every day | ORAL | 0 refills | Status: DC

## 2016-06-04 ENCOUNTER — Other Ambulatory Visit: Payer: Self-pay | Admitting: Family Medicine

## 2016-06-04 DIAGNOSIS — F411 Generalized anxiety disorder: Secondary | ICD-10-CM

## 2016-07-09 ENCOUNTER — Other Ambulatory Visit: Payer: Self-pay | Admitting: Family Medicine

## 2016-07-09 ENCOUNTER — Encounter: Payer: Self-pay | Admitting: Family Medicine

## 2016-07-09 ENCOUNTER — Ambulatory Visit (INDEPENDENT_AMBULATORY_CARE_PROVIDER_SITE_OTHER): Payer: BLUE CROSS/BLUE SHIELD | Admitting: Family Medicine

## 2016-07-09 VITALS — BP 110/70 | HR 82 | Temp 98.1°F | Resp 16 | Ht 64.0 in | Wt 250.5 lb

## 2016-07-09 DIAGNOSIS — J4520 Mild intermittent asthma, uncomplicated: Secondary | ICD-10-CM

## 2016-07-09 DIAGNOSIS — F331 Major depressive disorder, recurrent, moderate: Secondary | ICD-10-CM | POA: Diagnosis not present

## 2016-07-09 DIAGNOSIS — M2550 Pain in unspecified joint: Secondary | ICD-10-CM | POA: Diagnosis not present

## 2016-07-09 DIAGNOSIS — G4700 Insomnia, unspecified: Secondary | ICD-10-CM | POA: Diagnosis not present

## 2016-07-09 DIAGNOSIS — R350 Frequency of micturition: Secondary | ICD-10-CM | POA: Diagnosis not present

## 2016-07-09 DIAGNOSIS — F411 Generalized anxiety disorder: Secondary | ICD-10-CM

## 2016-07-09 DIAGNOSIS — R51 Headache: Secondary | ICD-10-CM

## 2016-07-09 DIAGNOSIS — R739 Hyperglycemia, unspecified: Secondary | ICD-10-CM

## 2016-07-09 DIAGNOSIS — K219 Gastro-esophageal reflux disease without esophagitis: Secondary | ICD-10-CM | POA: Diagnosis not present

## 2016-07-09 DIAGNOSIS — E785 Hyperlipidemia, unspecified: Secondary | ICD-10-CM

## 2016-07-09 DIAGNOSIS — I209 Angina pectoris, unspecified: Secondary | ICD-10-CM | POA: Diagnosis not present

## 2016-07-09 DIAGNOSIS — Z9989 Dependence on other enabling machines and devices: Secondary | ICD-10-CM

## 2016-07-09 DIAGNOSIS — G8929 Other chronic pain: Secondary | ICD-10-CM

## 2016-07-09 DIAGNOSIS — G4733 Obstructive sleep apnea (adult) (pediatric): Secondary | ICD-10-CM

## 2016-07-09 DIAGNOSIS — E034 Atrophy of thyroid (acquired): Secondary | ICD-10-CM

## 2016-07-09 DIAGNOSIS — I10 Essential (primary) hypertension: Secondary | ICD-10-CM

## 2016-07-09 DIAGNOSIS — R519 Headache, unspecified: Secondary | ICD-10-CM

## 2016-07-09 LAB — CBC WITH DIFFERENTIAL/PLATELET
BASOS ABS: 0 {cells}/uL (ref 0–200)
Basophils Relative: 0 %
EOS PCT: 1 %
Eosinophils Absolute: 96 cells/uL (ref 15–500)
HCT: 33.3 % — ABNORMAL LOW (ref 35.0–45.0)
Hemoglobin: 11.1 g/dL — ABNORMAL LOW (ref 11.7–15.5)
LYMPHS ABS: 2208 {cells}/uL (ref 850–3900)
Lymphocytes Relative: 23 %
MCH: 27.1 pg (ref 27.0–33.0)
MCHC: 33.3 g/dL (ref 32.0–36.0)
MCV: 81.4 fL (ref 80.0–100.0)
MONOS PCT: 7 %
MPV: 9.5 fL (ref 7.5–12.5)
Monocytes Absolute: 672 cells/uL (ref 200–950)
NEUTROS PCT: 69 %
Neutro Abs: 6624 cells/uL (ref 1500–7800)
PLATELETS: 302 10*3/uL (ref 140–400)
RBC: 4.09 MIL/uL (ref 3.80–5.10)
RDW: 17 % — ABNORMAL HIGH (ref 11.0–15.0)
WBC: 9.6 10*3/uL (ref 3.8–10.8)

## 2016-07-09 LAB — POCT URINALYSIS DIPSTICK
BILIRUBIN UA: NEGATIVE
Blood, UA: POSITIVE
GLUCOSE UA: NEGATIVE
Nitrite, UA: NEGATIVE
Spec Grav, UA: 1.02
Urobilinogen, UA: NEGATIVE
pH, UA: 6.5

## 2016-07-09 LAB — TSH: TSH: 0.14 m[IU]/L — AB

## 2016-07-09 MED ORDER — METOPROLOL SUCCINATE ER 25 MG PO TB24: 25.0000 mg | ORAL_TABLET | Freq: Every day | ORAL | 1 refills | Status: DC

## 2016-07-09 MED ORDER — CIPROFLOXACIN HCL 250 MG PO TABS: 250.0000 mg | ORAL_TABLET | Freq: Two times a day (BID) | ORAL | 0 refills | Status: DC

## 2016-07-09 MED ORDER — BACLOFEN 20 MG PO TABS: 20.0000 mg | ORAL_TABLET | Freq: Three times a day (TID) | ORAL | 0 refills | Status: DC | PRN

## 2016-07-09 MED ORDER — ATORVASTATIN CALCIUM 40 MG PO TABS
40.0000 mg | ORAL_TABLET | Freq: Every day | ORAL | 0 refills | Status: DC
Start: 2016-07-09 — End: 2016-10-07

## 2016-07-09 MED ORDER — ALPRAZOLAM ER 0.5 MG PO TB24: 0.5000 mg | ORAL_TABLET | Freq: Every day | ORAL | 2 refills | Status: DC

## 2016-07-09 MED ORDER — VALSARTAN-HYDROCHLOROTHIAZIDE 320-12.5 MG PO TABS: 1.0000 | ORAL_TABLET | Freq: Every day | ORAL | 0 refills | Status: DC

## 2016-07-09 MED ORDER — DULOXETINE HCL 60 MG PO CPEP: 60.0000 mg | ORAL_CAPSULE | Freq: Every day | ORAL | 1 refills | Status: DC

## 2016-07-09 MED ORDER — OMEPRAZOLE 40 MG PO CPDR: 40.0000 mg | DELAYED_RELEASE_CAPSULE | Freq: Every day | ORAL | 1 refills | Status: DC

## 2016-07-09 MED ORDER — MONTELUKAST SODIUM 10 MG PO TABS: 10.0000 mg | ORAL_TABLET | Freq: Every day | ORAL | 1 refills | Status: DC

## 2016-07-09 NOTE — Patient Instructions (Signed)
Analgesic Rebound Headache An analgesic rebound headache, sometimes called a medication overuse headache, is a headache that comes after pain medicine (analgesic) taken to treat the original (primary) headache has worn off. Any type of primary headache can return as a rebound headache if a person regularly takes analgesics more than three times a week to treat it. The types of primary headaches that are commonly associated with rebound headaches include:  Migraines.  Headaches that arise from tense muscles in the head and neck area (tension headaches).  Headaches that develop and happen again (recur) on one side of the head and around the eye (cluster headaches). If rebound headaches continue, they become chronic daily headaches. What are the causes? This condition may be caused by frequent use of:  Over-the-counter medicines such as aspirin, ibuprofen, and acetaminophen.  Sinus relief medicines and other medicines that contain caffeine.  Narcotic pain medicines such as codeine and oxycodone. What are the signs or symptoms? The symptoms of a rebound headache are the same as the symptoms of the original headache. Some of the symptoms of specific types of headaches include: Migraine headache  Pulsing or throbbing pain on one or both sides of the head.  Severe pain that interferes with daily activities.  Pain that is worsened by physical activity.  Nausea, vomiting, or both.  Pain with exposure to bright light, loud noises, or strong smells.  General sensitivity to bright light, loud noises, or strong smells.  Visual changes.  Numbness of one or both arms. Tension headache  Pressure around the head.  Dull, aching head pain.  Pain felt over the front and sides of the head.  Tenderness in the muscles of the head, neck, and shoulders. Cluster headache  Severe pain that begins in or around one eye or temple.  Redness and tearing in the eye on the same side as the  pain.  Droopy or swollen eyelid.  One-sided head pain.  Nausea.  Runny nose.  Sweaty, pale facial skin.  Restlessness. How is this diagnosed? This condition is diagnosed by:  Reviewing your medical history. This includes the nature of your primary headaches.  Reviewing the types of pain medicines that you have been using to treat your headaches and how often you take them. How is this treated? This condition may be treated or managed by:  Discontinuing frequent use of the analgesic medicine. Doing this may worsen your headaches at first, but the pain should eventually become more manageable, less frequent, and less severe.  Seeing a headache specialist. He or she may be able to help you manage your headaches and help make sure there is not another cause of the headaches.  Using methods of stress relief, such as acupuncture, counseling, biofeedback, and massage. Talk with your health care provider about which methods might be good for you. Follow these instructions at home:  Take over-the-counter and prescription medicines only as told by your health care provider.  Stop the repeated use of pain medicine as told by your health care provider. Stopping can be difficult. Carefully follow instructions from your health care provider.  Avoid triggers that are known to cause your primary headaches.  Keep all follow-up visits as told by your health care provider. This is important. Contact a health care provider if:  You continue to experience headaches after following treatments that your health care provider recommended. Get help right away if:  You develop new headache pain.  You develop headache pain that is different than what you have  what you have experienced in the past.  You develop numbness or tingling in your arms or legs.  You develop changes in your speech or vision. This information is not intended to replace advice given to you by your health care provider. Make sure you  discuss any questions you have with your health care provider. Document Released: 10/05/2003 Document Revised: 02/02/2016 Document Reviewed: 12/18/2015 Elsevier Interactive Patient Education  2017 [employer]  

## 2016-07-09 NOTE — Progress Notes (Signed)
Name: Beverly Barrett   MRN: [id number]    DOB: [birth year]   Date:07/09/2016       Progress Note  Subjective  Chief Complaint  Chief Complaint  Patient presents with  . Medication Refill    3 month F/U  . Depression  . Insomnia    still having difficulty sleeping even with medication  . Pain    Cymbalta  . Headache    pt stated that she has what feel like knots behind each ear and they cause her headaches and dizziness  . Urinary Frequency    HPI   Major Depression/GAD: She has a long history of depression and was started  on Paroxetine 40 mg in the morning and Depakote 250 mg DR one tablet in the evenings, however since Depakote was not helping we tried Buspar ( May 2017 ) but again no improvement of symptoms. .We added Alprazolam XR 0.5 mg July 2017 and she has noticed some improvement in the anxiety level, but still has severe symptoms, we added Cymbalta 02/2016 and she was doing better, but recently had an episode of bladder incontinence and has been feeling embarrassed since.  She still has occasionally has suicidal ideation but no planning ( she states she would not do it because of her grandson ) - she feels guilty about not contributing financially to her family and also because her health care is so expensive, so she feels like her family would be better off without her. She complains of depressed mood, fatigue, feelings of worthlessness/guilt, and insomnia. Family history significant for anxiety and depression, one of her brother's attempted suicide many years ago. She has severe anxiety, difficulty interacting with others, she picks on her nails and makes herself bleed at times, constantly shaking her legs, restless, always worried and also snappy. Discussed psychiatric evaluation but she wants to hold off for now because of cost. She denies weapons in her house.  Urinary urgency and incontinence: she states that a few weeks she noticed urinary frequency, and over the past  two weeks she had three episodes of urinary incontinence. She feels an urge to void, but unable to hold her urine. No blood in urine, dysuria, she has chronic back pain - no changes, no saddle anesthesia.  HTN: she taking her medication Exforge HCTZ and started on Toprol XL , July 2017. She has noticed that fluttering sensation has improved  with beta-blockers, but bp has been low and she is getting dizzy at times, we will decrease dose of Exforge to half daily and once out of medication we will switch to Valsartan/hctz only   Angina: had evaluation by cardiologist and Echo myoview in July  normal result. She states chest pain still happens and at times during rest. She is on statin, aspirin, beta-blocker , ARB and has been taking NTG intermittently, only took twice since last visit. Pain is described as throbbing sensation on left side of chest and sometimes radiates to her left shoulder and back.   Constipation: she was started on Linzess July 2017 and states has bowel movements daily , no straining or blood in stools. No side effects of medications   Chronic joint pain involving lower back, hips, right greater than left knees. She is taking Diclofenac only prn now, discussed trying Tylenol instead because of GERD.  Advised to stop taking  Goody Powders - discussed risk of bleeding and also kidney disease  Hypothyroidism she is currently taking medication daily, she has chronic dry skin and constipation (  resolved with Linzes), we will repeat TSH today  OSA: using CPAP every night, all night, and we started her on Seroquel on her last visit. It still takes her 2 hours to fall asleep but is able to stay asleep for about 7 hours.    Obesity: she lost a few lobs since lat visit  GERD: taking Omeprazole, she stopped taking Goody Powders as often, only taking Diclofenac once a day and symptoms resolved.   Dyslipidemia: high triglycerides back in May and LDL was 103 we will recheck labs. She is  on statin therapy  Hyperglycemia: last hgbA1C was 6.3%, she has noticed polyphagia, polydipsia and polyuria lately.   Joint aches/chronic pain: hands, back, knees, shoulders, takes Diclofenac, pain still not controlled.    Patient Active Problem List   Diagnosis Date Noted  . GAD (generalized anxiety disorder) 12/20/2015  . OSA on CPAP 12/20/2015  . Angina effort ([place]) 12/20/2015  . Hyperglycemia 09/22/2015  . History of colonic polyps   . Benign neoplasm of sigmoid colon   . Frequency 04/14/2015  . Midline low back pain without sciatica 04/14/2015  . Chronic pain of multiple joints 06/14/2014  . Dysmetabolic syndrome [id number]  . Acid reflux 06/14/2014  . Extreme obesity ([place]) 06/14/2014  . Arthritis, degenerative 06/14/2014  . Morbid obesity with BMI of 40.0-44.9, adult ([place]) 06/14/2014  . Hypothyroidism due to acquired atrophy of thyroid 08/18/2013  . Hyperlipidemia 12/07/2010  . Hypertension goal BP (blood pressure) < 140/90 12/07/2010  . Familial multiple lipoprotein-type hyperlipidemia 12/07/2010  . CN (constipation) 11/21/2009  . Allergic rhinitis 10/13/2008  . Asthma, mild intermittent, well-controlled 09/01/2008  . Major depressive disorder, recurrent episode, in partial remission with anxious distress ([place]) 08/13/2007    Past Surgical History:  Procedure Laterality Date  . ABDOMINAL HYSTERECTOMY    . CESAREAN SECTION    . COLONOSCOPY WITH PROPOFOL N/A 05/29/2015   Procedure: COLONOSCOPY WITH PROPOFOL;  Surgeon: Lucilla Lame, MD;  Location: [facility];  Service: Endoscopy;  Laterality: N/A;  Latex allergy sleep apnea - no CPAP machine (yet)  . POLYPECTOMY  05/29/2015   Procedure: POLYPECTOMY;  Surgeon: Lucilla Lame, MD;  Location: [facility];  Service: Endoscopy;;    Family History  Problem Relation Age of Onset  . Diabetes Sister   . Hypertension Sister   . Cirrhosis Mother   . Cirrhosis Father   . Breast cancer Sister 56  . Heart attack  Brother     Social History   Social History  . Marital status: Married    Spouse name: N/A  . Number of children: N/A  . Years of education: N/A   Occupational History  . Not on file.   Social History Main Topics  . Smoking status: Never Smoker  . Smokeless tobacco: Never Used  . Alcohol use No  . Drug use: No  . Sexual activity: Yes    Partners: Male   Other Topics Concern  . Not on file   Social History Narrative  . No narrative on file     Current Outpatient Prescriptions:  .  ALPRAZolam (XANAX XR) 0.5 MG 24 hr tablet, Take 1 tablet (0.5 mg total) by mouth daily., Disp: 30 tablet, Rfl: 2 .  aspirin 81 MG EC tablet, Take 81 mg by mouth daily.  , Disp: , Rfl:  .  atorvastatin (LIPITOR) 40 MG tablet, Take 1 tablet (40 mg total) by mouth at bedtime., Disp: 90 tablet, Rfl: 0 .  Azelastine HCl 0.15 %  SOLN, Place 2 sprays into both nostrils 2 (two) times daily. , Disp: , Rfl: 3 .  ciprofloxacin (CIPRO) 250 MG tablet, Take 1 tablet (250 mg total) by mouth 2 (two) times daily., Disp: 10 tablet, Rfl: 0 .  diclofenac (VOLTAREN) 75 MG EC tablet, Take 1 tablet (75 mg total) by mouth every 12 (twelve) hours as needed., Disp: 180 tablet, Rfl: 0 .  DULoxetine (CYMBALTA) 60 MG capsule, Take 1 capsule (60 mg total) by mouth daily. Daily, Disp: 90 capsule, Rfl: 1 .  EQ ALLERGY RELIEF 10 MG tablet, TAKE ONE TABLET BY MOUTH ONCE DAILY, Disp: 90 tablet, Rfl: 1 .  fluticasone (FLONASE) 50 MCG/ACT nasal spray, by Nasal route., Disp: , Rfl:  .  levothyroxine (SYNTHROID, LEVOTHROID) 112 MCG tablet, Take 1 tablet (112 mcg total) by mouth daily before breakfast., Disp: 90 tablet, Rfl: 0 .  linaclotide (LINZESS) 145 MCG CAPS capsule, TAKE ONE CAPSULE BY MOUTH ONCE DAILY BEFORE BREAKFAST, Disp: 30 capsule, Rfl: 2 .  metoprolol succinate (TOPROL XL) 25 MG 24 hr tablet, Take 1 tablet (25 mg total) by mouth daily., Disp: 90 tablet, Rfl: 1 .  montelukast (SINGULAIR) 10 MG tablet, Take 1 tablet (10 mg  total) by mouth at bedtime., Disp: 90 tablet, Rfl: 1 .  nitroGLYCERIN (NITROSTAT) 0.4 MG SL tablet, Place 1 tablet (0.4 mg total) under the tongue every 5 (five) minutes as needed., Disp: 25 tablet, Rfl: 6 .  omeprazole (PRILOSEC) 40 MG capsule, Take 1 capsule (40 mg total) by mouth daily., Disp: 90 capsule, Rfl: 1 .  QUEtiapine (SEROQUEL) 25 MG tablet, Take 1 tablet (25 mg total) by mouth at bedtime., Disp: 90 tablet, Rfl: 1 .  valsartan-hydrochlorothiazide (DIOVAN-HCT) 320-12.5 MG tablet, Take 1 tablet by mouth daily., Disp: 90 tablet, Rfl: 0  Allergies  Allergen Reactions  . Latex     Pt unsure of reaction pt states something happened while in the hospital after surgery.     ROS  Constitutional: Negative for fever or weight change.  Respiratory: Negative for cough and shortness of breath.   Cardiovascular: Positive  for chest pain and  palpitations.  Gastrointestinal: Negative for abdominal pain, no bowel changes.  Musculoskeletal: Negative for gait problem or joint swelling.  Skin: Negative for rash.  Neurological: Positive for intermittent dizziness and headache.  No other specific complaints in a complete review of systems (except as listed in HPI above).  Objective  Vitals:   07/09/16 1021  BP: 110/70  Pulse: 82  Resp: 16  Temp: 98.1 F (36.7 C)  TempSrc: Oral  SpO2: 92%  Weight: 250 lb 8 oz (113.6 kg)  Height: 5\' 4"  (1.626 m)    Body mass index is 43 kg/m.  Physical Exam  Constitutional: Patient appears well-developed and well-nourished. Obese No distress.  HEENT: head atraumatic, normocephalic, pupils equal and reactive to light, ears normal TM, neck supple, throat within normal limits Cardiovascular: Normal rate, regular rhythm and normal heart sounds.  No murmur heard. No BLE edema. Pulmonary/Chest: Effort normal and breath sounds normal. No respiratory distress. Abdominal: Soft.  There is no tenderness. Psychiatric: Patient has a normal mood and affect.  behavior is normal. Judgment and thought content normal. Neurological exam: normal sensation, normal back exam, negative straight leg raise   Recent Results (from the past 2160 hour(s))  POCT urinalysis dipstick     Status: Abnormal   Collection Time: 07/09/16 10:43 AM  Result Value Ref Range   Color, UA yellow    Clarity,  UA cloudy    Glucose, UA neg    Bilirubin, UA neg    Ketones, UA trace    Spec Grav, UA 1.020    Blood, UA positive    pH, UA 6.5    Protein, UA trace    Urobilinogen, UA negative    Nitrite, UA neg    Leukocytes, UA large (3+) (A) Negative      PHQ2/9: Depression screen [facility] 2/9 07/09/2016 04/08/2016 03/04/2016 02/02/2016 09/22/2015  Decreased Interest 1 1 2 3 1   Down, Depressed, Hopeless 1 1 3 3 1   PHQ - 2 Score 2 2 5 6 2   Altered sleeping 1 0 3 3 0  Tired, decreased energy 0 0 3 3 1   Change in appetite 0 1 2 0 0  Feeling bad or failure about yourself  0 1 3 3 1   Trouble concentrating 1 0 0 3 0  Moving slowly or fidgety/restless 0 0 0 2 0  Suicidal thoughts - 0 2 2 1   PHQ-9 Score 4 4 18 22 5   Difficult doing work/chores Somewhat difficult - Extremely dIfficult Very difficult -     Fall Risk: Fall Risk  07/09/2016 04/08/2016 02/02/2016 09/22/2015 06/16/2015  Falls in the past year? Yes Yes No No No  Number falls in past yr: 2 or more 1 - - -  Injury with Fall? No No - - -  Follow up Falls evaluation completed - - - -      Functional Status Survey: Is the patient deaf or have difficulty hearing?: No Does the patient have difficulty seeing, even when wearing glasses/contacts?: No Does the patient have difficulty concentrating, remembering, or making decisions?: No Does the patient have difficulty walking or climbing stairs?: Yes Does the patient have difficulty dressing or bathing?: No Does the patient have difficulty doing errands alone such as visiting a doctor's office or shopping?: No    Assessment & Plan  1. Frequency of urination  Advised  to go to [facility] if symptoms persists, we will try empirical therapy for UTI since she does not have any neurological red flad - POCT urinalysis dipstick - CULTURE, URINE COMPREHENSIVE - ciprofloxacin (CIPRO) 250 MG tablet; Take 1 tablet (250 mg total) by mouth 2 (two) times daily.  Dispense: 10 tablet; Refill: 0 - CBC with Differential/Platelet  2. Depression, major, recurrent, moderate ([place])  Explained that she must see Psychiatrist, symptoms not controlled, but she states she needs to find out how much it will cost and call us back  - DULoxetine (CYMBALTA) 60 MG capsule; Take 1 capsule (60 mg total) by mouth daily. Daily  Dispense: 90 capsule; Refill: 1  3. GAD (generalized anxiety disorder)  - metoprolol succinate (TOPROL XL) 25 MG 24 hr tablet; Take 1 tablet (25 mg total) by mouth daily.  Dispense: 90 tablet; Refill: 1 - DULoxetine (CYMBALTA) 60 MG capsule; Take 1 capsule (60 mg total) by mouth daily. Daily  Dispense: 90 capsule; Refill: 1 - ALPRAZolam (XANAX XR) 0.5 MG 24 hr tablet; Take 1 tablet (0.5 mg total) by mouth daily.  Dispense: 30 tablet; Refill: 2  4. Insomnia, unspecified type  Doing better on Seroquel   5. Angina pectoris ([facility])  - metoprolol succinate (TOPROL XL) 25 MG 24 hr tablet; Take 1 tablet (25 mg total) by mouth daily.  Dispense: 90 tablet; Refill: 1  6. Hypertension goal BP (blood pressure) < 140/90  We will adjust medication ,bp towards low end of normal  - valsartan-hydrochlorothiazide (DIOVAN-HCT) 320-12.5  MG tablet; Take 1 tablet by mouth daily.  Dispense: 90 tablet; Refill: 0 - COMPLETE METABOLIC PANEL WITH GFR  7. Hypothyroidism due to acquired atrophy of thyroid  - TSH  8. Chronic pain of multiple joints  - DULoxetine (CYMBALTA) 60 MG capsule; Take 1 capsule (60 mg total) by mouth daily. Daily  Dispense: 90 capsule; Refill: 1  9. Asthma, mild intermittent, well-controlled  - montelukast (SINGULAIR) 10 MG tablet; Take 1 tablet (10 mg total) by  mouth at bedtime.  Dispense: 90 tablet; Refill: 1  10. OSA on CPAP  Continue CPAP   11. Hyperlipidemia with target LDL less than 100  - atorvastatin (LIPITOR) 40 MG tablet; Take 1 tablet (40 mg total) by mouth at bedtime.  Dispense: 90 tablet; Refill: 0 - Lipid panel  12. Gastroesophageal reflux disease without esophagitis  - omeprazole (PRILOSEC) 40 MG capsule; Take 1 capsule (40 mg total) by mouth daily.  Dispense: 90 capsule; Refill: 1  13. Hyperglycemia  - Hemoglobin A1c  14. Daily headache  - baclofen (LIORESAL) 20 MG tablet; Take 1 tablet (20 mg total) by mouth 3 (three) times daily as needed for muscle spasms.  Dispense: 30 each; Refill: 0

## 2016-07-10 LAB — HEMOGLOBIN A1C
Hgb A1c MFr Bld: 6.2 % — ABNORMAL HIGH (ref <5.7)
MEAN PLASMA GLUCOSE: 131 mg/dL

## 2016-07-10 LAB — LIPID PANEL
CHOLESTEROL: 155 mg/dL (ref <200)
HDL: 37 mg/dL — ABNORMAL LOW (ref >50)
LDL Cholesterol: 56 mg/dL (ref <100)
TRIGLYCERIDES: 308 mg/dL — AB (ref <150)
Total CHOL/HDL Ratio: 4.2 Ratio (ref <5.0)
VLDL: 62 mg/dL — ABNORMAL HIGH (ref <30)

## 2016-07-10 LAB — COMPLETE METABOLIC PANEL WITH GFR
ALBUMIN: 4.2 g/dL (ref 3.6–5.1)
ALK PHOS: 63 U/L (ref 33–130)
ALT: 11 U/L (ref 6–29)
AST: 15 U/L (ref 10–35)
BILIRUBIN TOTAL: 0.4 mg/dL (ref 0.2–1.2)
BUN: 16 mg/dL (ref 7–25)
CALCIUM: 9 mg/dL (ref 8.6–10.4)
CO2: 28 mmol/L (ref 20–31)
Chloride: 101 mmol/L (ref 98–110)
Creat: 0.74 mg/dL (ref 0.50–1.05)
GFR, Est African American: >89 mL/min (ref >=60)
GLUCOSE: 111 mg/dL — AB (ref 65–99)
POTASSIUM: 3.3 mmol/L — AB (ref 3.5–5.3)
SODIUM: 142 mmol/L (ref 135–146)
TOTAL PROTEIN: 6.9 g/dL (ref 6.1–8.1)

## 2016-07-11 LAB — CULTURE, URINE COMPREHENSIVE

## 2016-07-15 ENCOUNTER — Other Ambulatory Visit: Payer: Self-pay | Admitting: Family Medicine

## 2016-07-15 DIAGNOSIS — D649 Anemia, unspecified: Secondary | ICD-10-CM

## 2016-07-16 LAB — IRON AND TIBC
%SAT: 13 % (ref 11–50)
IRON: 54 ug/dL (ref 45–160)
TIBC: 406 ug/dL (ref 250–450)
UIBC: 352 ug/dL (ref 125–400)

## 2016-07-16 LAB — FERRITIN: FERRITIN: 17 ng/mL (ref 10–232)

## 2016-09-09 ENCOUNTER — Other Ambulatory Visit: Payer: Self-pay | Admitting: Family Medicine

## 2016-09-09 ENCOUNTER — Other Ambulatory Visit: Payer: Self-pay

## 2016-09-09 DIAGNOSIS — E034 Atrophy of thyroid (acquired): Secondary | ICD-10-CM

## 2016-09-09 DIAGNOSIS — D649 Anemia, unspecified: Secondary | ICD-10-CM

## 2016-09-09 DIAGNOSIS — E039 Hypothyroidism, unspecified: Secondary | ICD-10-CM

## 2016-09-09 DIAGNOSIS — K5909 Other constipation: Secondary | ICD-10-CM

## 2016-09-09 NOTE — Telephone Encounter (Signed)
Patient requesting refill of Levothyroxine to [facility].

## 2016-09-10 LAB — IRON AND TIBC
%SAT: 8 % — AB (ref 11–50)
IRON: 37 ug/dL — AB (ref 45–160)
TIBC: 437 ug/dL (ref 250–450)
UIBC: 400 ug/dL (ref 125–400)

## 2016-09-11 LAB — TSH: TSH: 0.2 mIU/L — ABNORMAL LOW

## 2016-09-11 LAB — FERRITIN: Ferritin: 13 ng/mL (ref 10–232)

## 2016-09-12 ENCOUNTER — Encounter: Payer: Self-pay | Admitting: Family Medicine

## 2016-09-12 ENCOUNTER — Other Ambulatory Visit: Payer: Self-pay | Admitting: Family Medicine

## 2016-09-12 DIAGNOSIS — E034 Atrophy of thyroid (acquired): Secondary | ICD-10-CM

## 2016-09-12 DIAGNOSIS — D509 Iron deficiency anemia, unspecified: Secondary | ICD-10-CM | POA: Insufficient documentation

## 2016-09-12 DIAGNOSIS — D508 Other iron deficiency anemias: Secondary | ICD-10-CM

## 2016-09-12 MED ORDER — LEVOTHYROXINE SODIUM 100 MCG PO TABS: 100.0000 ug | ORAL_TABLET | Freq: Every day | ORAL | 1 refills | Status: DC

## 2016-09-16 ENCOUNTER — Other Ambulatory Visit: Payer: Self-pay | Admitting: Family Medicine

## 2016-09-16 ENCOUNTER — Telehealth: Payer: Self-pay | Admitting: Family Medicine

## 2016-09-16 DIAGNOSIS — E034 Atrophy of thyroid (acquired): Secondary | ICD-10-CM

## 2016-09-16 MED ORDER — LEVOTHYROXINE SODIUM 88 MCG PO TABS: 88.0000 ug | ORAL_TABLET | Freq: Every day | ORAL | 0 refills | Status: DC

## 2016-09-16 NOTE — Telephone Encounter (Signed)
PT MISSED CALL PLEASE CALL BACK ABOUT RESULTS

## 2016-09-16 NOTE — Telephone Encounter (Signed)
[facility] states prescriptions for levothyroxin 100 mcg was sent in on 09/12/16 however a note was also on the bottom saying discontinue the 184mcg. Please clarify [id number]

## 2016-09-20 ENCOUNTER — Ambulatory Visit (INDEPENDENT_AMBULATORY_CARE_PROVIDER_SITE_OTHER): Payer: BLUE CROSS/BLUE SHIELD | Admitting: Neurology

## 2016-09-20 ENCOUNTER — Ambulatory Visit: Payer: BLUE CROSS/BLUE SHIELD | Admitting: Neurology

## 2016-09-20 ENCOUNTER — Encounter: Payer: Self-pay | Admitting: Neurology

## 2016-09-20 DIAGNOSIS — G43019 Migraine without aura, intractable, without status migrainosus: Secondary | ICD-10-CM | POA: Insufficient documentation

## 2016-09-20 HISTORY — DX: Migraine without aura, intractable, without status migrainosus: G43.019

## 2016-09-20 MED ORDER — TOPIRAMATE 25 MG PO TABS: ORAL_TABLET | ORAL | 3 refills | Status: DC

## 2016-09-20 MED ORDER — RIZATRIPTAN BENZOATE 10 MG PO TABS: 10.0000 mg | ORAL_TABLET | Freq: Three times a day (TID) | ORAL | 3 refills | Status: DC | PRN

## 2016-09-20 NOTE — Progress Notes (Signed)
Reason for visit: Migraine headache  Referring physician: Dr. Kendra Opitz is a 56 y.o. female  History of present illness:  Beverly Barrett is a 56 year old right-handed black female with a history of migraine headaches off and on throughout her life. The patient usually has headaches in the frontotemporal areas bilaterally, these headaches may be associated with some blurred vision, nausea, photophobia and phonophobia. The patient will have 2 or 3 such headaches a week, the headaches may last up to about 2 hours. The patient takes [facility] powders for the headache with some benefit. The patient has noted that certain odors such as perfumes, missing a meal, heat exposure may bring on the headache. The patient has developed a new type of headache over the last 2 or 3 months. She indicates that she will get discomfort in the back of the neck and behind the ears, she reports a throbbing discomfort, she will also have nausea with these headaches, photophobia and phonophobia, and blurring of vision. The patient may also have some dizziness. The patient uses about 6 Goody powders a week on average, she drinks coffee 2 or 3 times a week, and Pepsi one or 2 times a week. The patient drinks tea on a daily basis. She has a strong family history of migraine in 2 daughters, both parents, and 2 sisters all have migraine headache. The patient is sent to this office for an evaluation. The patient has had several CT head scan evaluations since 2006, the last one was in 2012, this was unremarkable. Currently, the patient does report some neck stiffness with these headaches.  Past Medical History:  Diagnosis Date  . Anginal pain ([place])    none in approx 2 yrs  . Asthma   . Chronic lower back pain   . Common migraine with intractable migraine 09/20/2016  . Depression   . GERD (gastroesophageal reflux disease)   . Headache    migraines -2x/month  . Hyperlipidemia   . Hypertension   . Hypothyroidism   .  Motion sickness    cars  . Shortness of breath dyspnea   . Sleep apnea    No machine  . Vertigo   . Wears dentures    partial upper    Past Surgical History:  Procedure Laterality Date  . ABDOMINAL HYSTERECTOMY    . CESAREAN SECTION    . COLONOSCOPY WITH PROPOFOL N/A 05/29/2015   Procedure: COLONOSCOPY WITH PROPOFOL;  Surgeon: Lucilla Lame, MD;  Location: [place];  Service: Endoscopy;  Laterality: N/A;  Latex allergy sleep apnea - no CPAP machine (yet)  . POLYPECTOMY  05/29/2015   Procedure: POLYPECTOMY;  Surgeon: Lucilla Lame, MD;  Location: [place];  Service: Endoscopy;;    Family History  Problem Relation Age of Onset  . Diabetes Sister   . Hypertension Sister   . Cirrhosis Mother   . Cirrhosis Father   . Breast cancer Sister 14  . Heart attack Brother     Social history:  reports that she has never smoked. She has never used smokeless tobacco. She reports that she does not drink alcohol or use drugs.  Medications:  Prior to Admission medications   Medication Sig Start Date End Date Taking? Authorizing Provider  ALPRAZolam (XANAX XR) 0.5 MG 24 hr tablet Take 1 tablet (0.5 mg total) by mouth daily. 07/09/16  Yes Steele Sizer, MD  aspirin 81 MG EC tablet Take 81 mg by mouth daily.     Yes Historical  Provider, MD  atorvastatin (LIPITOR) 40 MG tablet Take 1 tablet (40 mg total) by mouth at bedtime. 07/09/16  Yes Steele Sizer, MD  Azelastine HCl 0.15 % SOLN Place 2 sprays into both nostrils 2 (two) times daily.  01/14/15  Yes Historical Provider, MD  diclofenac (VOLTAREN) 75 MG EC tablet Take 1 tablet (75 mg total) by mouth every 12 (twelve) hours as needed. 03/04/16  Yes Steele Sizer, MD  DULoxetine (CYMBALTA) 60 MG capsule Take 1 capsule (60 mg total) by mouth daily. Daily 07/09/16  Yes Steele Sizer, MD  EQ ALLERGY RELIEF 10 MG tablet TAKE ONE TABLET BY MOUTH ONCE DAILY 07/25/15  Yes Bobetta Lime, MD  fluticasone (FLONASE) 50 MCG/ACT nasal  spray by Nasal route. 04/11/14  Yes Historical Provider, MD  levothyroxine (SYNTHROID, LEVOTHROID) 88 MCG tablet Take 1 tablet (88 mcg total) by mouth daily before breakfast. 09/16/16  Yes Steele Sizer, MD  LINZESS 145 MCG CAPS capsule TAKE ONE CAPSULE BY MOUTH ONCE DAILY BEFORE BREAKFAST 09/09/16  Yes Steele Sizer, MD  metoprolol succinate (TOPROL XL) 25 MG 24 hr tablet Take 1 tablet (25 mg total) by mouth daily. 07/09/16  Yes Steele Sizer, MD  montelukast (SINGULAIR) 10 MG tablet Take 1 tablet (10 mg total) by mouth at bedtime. 07/09/16  Yes Steele Sizer, MD  nitroGLYCERIN (NITROSTAT) 0.4 MG SL tablet Place 1 tablet (0.4 mg total) under the tongue every 5 (five) minutes as needed. 01/11/16  Yes Wende Bushy, MD  omeprazole (PRILOSEC) 40 MG capsule Take 1 capsule (40 mg total) by mouth daily. 07/09/16  Yes Steele Sizer, MD  QUEtiapine (SEROQUEL) 25 MG tablet Take 1 tablet (25 mg total) by mouth at bedtime. 04/08/16  Yes Steele Sizer, MD  valsartan-hydrochlorothiazide (DIOVAN-HCT) 320-12.5 MG tablet Take 1 tablet by mouth daily. 07/09/16  Yes Steele Sizer, MD      Allergies  Allergen Reactions  . Latex     Pt unsure of reaction pt states something happened while in the hospital after surgery.    ROS:  Out of a complete 14 system review of symptoms, the patient complains only of the following symptoms, and all other reviewed systems are negative.  Headache Neck pain  Blood pressure (!) 144/73, pulse 71, height 5\' 4"  (1.626 m), weight 149 lb (67.6 kg).  Physical Exam  General: The patient is alert and cooperative at the time of the examination. The patient is morbidly obese.  Eyes: Pupils are equal, round, and reactive to light. Discs are flat bilaterally.  Neck: The neck is supple, no carotid bruits are noted.  Respiratory: The respiratory examination is clear.  Cardiovascular: The cardiovascular examination reveals a regular rate and rhythm, no obvious murmurs or rubs  are noted.  Skin: Extremities are without significant edema.  Neurologic Exam  Mental status: The patient is alert and oriented x 3 at the time of the examination. The patient has apparent normal recent and remote memory, with an apparently normal attention span and concentration ability.  Cranial nerves: Facial symmetry is present. There is good sensation of the face to pinprick and soft touch bilaterally. The strength of the facial muscles and the muscles to head turning and shoulder shrug are normal bilaterally. Speech is well enunciated, no aphasia or dysarthria is noted. Extraocular movements are full. Visual fields are full. The tongue is midline, and the patient has symmetric elevation of the soft palate. No obvious hearing deficits are noted.  Motor: The motor testing reveals 5 over 5 strength of all  4 extremities. Good symmetric motor tone is noted throughout.  Sensory: Sensory testing is intact to pinprick, soft touch, vibration sensation, and position sense on all 4 extremities. No evidence of extinction is noted.  Coordination: Cerebellar testing reveals good finger-nose-finger and heel-to-shin bilaterally.  Gait and station: Gait is normal. Tandem gait is normal. Romberg is negative. No drift is seen.  Reflexes: Deep tendon reflexes are symmetric and normal bilaterally. Toes are downgoing bilaterally.   Assessment/Plan:  1. Migraine headache  The patient has what appears to be common migraine headache. Some of the headaches are in the front of the head, these new headaches are in the back of the head, but they still represent migraine. The patient will be placed on Topamax, she has been on this in the past, but she was not on the medication long. The patient will be given Maxalt to take if needed for pain when the headache starts. The patient will follow-up in 3 months.  Jill Alexanders MD 09/20/2016 8:57 AM  [facility] [address] [place], [place] [address]  Phone [phone] Fax [phone]

## 2016-09-20 NOTE — Patient Instructions (Signed)
   We will start Topamax daily for headache prevention. We will use Maxalt if needed for the headache.  Topamax (topiramate) is a seizure medication that has an FDA approval for seizures and for migraine headache. Potential side effects of this medication include weight loss, cognitive slowing, tingling in the fingers and toes, and carbonated drinks will taste bad. If any significant side effects are noted on this drug, please contact our office.

## 2016-10-07 ENCOUNTER — Ambulatory Visit (INDEPENDENT_AMBULATORY_CARE_PROVIDER_SITE_OTHER): Payer: BLUE CROSS/BLUE SHIELD | Admitting: Family Medicine

## 2016-10-07 ENCOUNTER — Encounter: Payer: Self-pay | Admitting: Family Medicine

## 2016-10-07 VITALS — BP 132/78 | HR 81 | Temp 98.1°F | Resp 16 | Ht 64.0 in | Wt 251.0 lb

## 2016-10-07 DIAGNOSIS — E8881 Metabolic syndrome: Secondary | ICD-10-CM

## 2016-10-07 DIAGNOSIS — F411 Generalized anxiety disorder: Secondary | ICD-10-CM

## 2016-10-07 DIAGNOSIS — I10 Essential (primary) hypertension: Secondary | ICD-10-CM

## 2016-10-07 DIAGNOSIS — G4700 Insomnia, unspecified: Secondary | ICD-10-CM | POA: Diagnosis not present

## 2016-10-07 DIAGNOSIS — G4733 Obstructive sleep apnea (adult) (pediatric): Secondary | ICD-10-CM

## 2016-10-07 DIAGNOSIS — Z6841 Body Mass Index (BMI) 40.0 and over, adult: Secondary | ICD-10-CM

## 2016-10-07 DIAGNOSIS — M545 Low back pain, unspecified: Secondary | ICD-10-CM

## 2016-10-07 DIAGNOSIS — F331 Major depressive disorder, recurrent, moderate: Secondary | ICD-10-CM | POA: Diagnosis not present

## 2016-10-07 DIAGNOSIS — E785 Hyperlipidemia, unspecified: Secondary | ICD-10-CM | POA: Diagnosis not present

## 2016-10-07 DIAGNOSIS — R350 Frequency of micturition: Secondary | ICD-10-CM | POA: Diagnosis not present

## 2016-10-07 DIAGNOSIS — G8929 Other chronic pain: Secondary | ICD-10-CM

## 2016-10-07 DIAGNOSIS — M2550 Pain in unspecified joint: Secondary | ICD-10-CM | POA: Diagnosis not present

## 2016-10-07 DIAGNOSIS — I209 Angina pectoris, unspecified: Secondary | ICD-10-CM | POA: Diagnosis not present

## 2016-10-07 DIAGNOSIS — E034 Atrophy of thyroid (acquired): Secondary | ICD-10-CM

## 2016-10-07 DIAGNOSIS — K5909 Other constipation: Secondary | ICD-10-CM

## 2016-10-07 DIAGNOSIS — Z9989 Dependence on other enabling machines and devices: Secondary | ICD-10-CM

## 2016-10-07 MED ORDER — QUETIAPINE FUMARATE 25 MG PO TABS: 25.0000 mg | ORAL_TABLET | Freq: Every day | ORAL | 1 refills | Status: DC

## 2016-10-07 MED ORDER — ALPRAZOLAM ER 0.5 MG PO TB24: 0.5000 mg | ORAL_TABLET | Freq: Every day | ORAL | 2 refills | Status: DC

## 2016-10-07 MED ORDER — ATORVASTATIN CALCIUM 40 MG PO TABS: 40.0000 mg | ORAL_TABLET | Freq: Every day | ORAL | 0 refills | Status: DC

## 2016-10-07 MED ORDER — VALSARTAN-HYDROCHLOROTHIAZIDE 320-12.5 MG PO TABS: 1.0000 | ORAL_TABLET | Freq: Every day | ORAL | 1 refills | Status: DC

## 2016-10-07 MED ORDER — DICLOFENAC SODIUM 75 MG PO TBEC: 75.0000 mg | DELAYED_RELEASE_TABLET | Freq: Two times a day (BID) | ORAL | 0 refills | Status: DC | PRN

## 2016-10-07 NOTE — Addendum Note (Signed)
Addended by: Inda Coke on: 10/07/2016 11:43 AM   Modules accepted: Orders

## 2016-10-07 NOTE — Progress Notes (Signed)
Name: Beverly Barrett   MRN: [id number]    DOB: 11-02-1960   Date:10/07/2016       Progress Note  Subjective  Chief Complaint  Chief Complaint  Patient presents with  . Back Pain    lower back pain seems to be getting worse  . Abdominal Pain    lower says it feels like something is fallen pt stated that she has had a fallen bladder in past  . Depression    3 month follow up  . Obesity  . Hyperlipidemia    HPI  Major Depression/GAD: She has a long history of depression and was started  on Paroxetine 40 mg in the morning and Depakote 250 mg DR one tablet in the evenings, however since Depakote was not helping we tried Buspar ( May 2017 ) but again no improvement of symptoms. .We added Alprazolam XR 0.5 mg July 2017 and she has noticed some improvement in the anxiety level, but still has severe symptoms, we added Cymbalta 02/2016 and she was doing better  She still has occasionally has suicidal ideation but no planning ( she states she would not do it because of her grandson ) - she feels guilty about not contributing financially to her family and also because her health care is so expensive, so she feels like her family would be better off without her. She complains of depressed mood, fatigue, feelings of worthlessness/guilt, and insomnia. Family history significant for anxiety and depression, one of her brother's attempted suicide many years ago. She has severe anxiety, difficulty interacting with others, she picks on her nails and makes herself bleed at times, constantly shaking her legs, restless, always worried and also snappy. Discussed psychiatric evaluation but she wants to hold off for now because of cost. She denies weapons in her house.  Urinary urgency and incontinence: she has recurrent episodes of UTI, took antibiotics three months and symptoms improved but having symptoms of low back and supra pubic pain again, urgency, nocturia, and sometimes she cannot make it to the  bathroom.Sshe has chronic back pain - no changes, no saddle anesthesia. We will refer her to Urologist   HTN: she taking her medication Exforge HCTZ and started on Toprol XL , July 2017. She has noticed that fluttering sensation has improved  with beta-blockers, bp is now at goal, occasionally gets dizzy, but improved now  Angina: had evaluation by cardiologist and Echo myoview in July normal result. She states chest pain still happens and at times during rest. She is on statin, aspirin, beta-blocker , ARB and has been taking NTG intermittently, but did not have to take any NTG since last visit.  Pain is described as throbbing sensation on left side of chest and sometimes radiates to her left shoulder and back.   Constipation: she was started on Linzess July 2017 and states has bowel movements daily , no straining or blood in stools. No side effects of medications   Chronic joint pain involving lower back, hips, right greater than left knees. She is taking Diclofenac only prn now, discussed trying Tylenol instead because of GERD. Advised to stop taking Goody Powders - discussed risk of bleeding and also kidney disease.  Hypothyroidism she is currently taking medication daily, she has chronic dry skin and constipation ( resolved with Linzes), last TSH was suppressed, we will recheck levels in 2 weeks on Synthroid 88 mcg daily for the past month.   OSA: using CPAP every night, all night, and we started her on  Seroquel on her last visit. It still takes her 2 hours to fall asleep but is able to stay asleep for about 7 hours.    Obesity: weight is stable, discussed life style modification again   GERD: taking Omeprazole, she stopped taking Goody Powders as often, only taking Diclofenac once a day and symptoms resolved.   Dyslipidemia: high triglycerides back in May and LDL was 103 we will recheck labs. She is on statin therapy  Hyperglycemia: last hgbA1C was 6.2%, she has noticed  polyphagia, polydipsia and polyuria lately.   Migraine: seen at the headache center by Dr. Jannifer Franklin, started on Topamax and Maxalt recently, still having headaches at this time  Patient Active Problem List   Diagnosis Date Noted  . Common migraine with intractable migraine 09/20/2016  . Iron deficiency anemia 09/12/2016  . GAD (generalized anxiety disorder) 12/20/2015  . OSA on CPAP 12/20/2015  . Angina effort ([place]) 12/20/2015  . Hyperglycemia 09/22/2015  . History of colonic polyps   . Benign neoplasm of sigmoid colon   . Frequency 04/14/2015  . Midline low back pain without sciatica 04/14/2015  . Chronic pain of multiple joints 06/14/2014  . Dysmetabolic syndrome 42/68/3419  . Acid reflux 06/14/2014  . Extreme obesity ([place]) 06/14/2014  . Arthritis, degenerative 06/14/2014  . Morbid obesity with BMI of 40.0-44.9, adult ([place]) 06/14/2014  . Hypothyroidism due to acquired atrophy of thyroid 08/18/2013  . Hyperlipidemia 12/07/2010  . Hypertension goal BP (blood pressure) < 140/90 12/07/2010  . Familial multiple lipoprotein-type hyperlipidemia 12/07/2010  . CN (constipation) 11/21/2009  . Allergic rhinitis 10/13/2008  . Asthma, mild intermittent, well-controlled 09/01/2008  . Major depressive disorder, recurrent episode, in partial remission with anxious distress ([place]) 08/13/2007    Past Surgical History:  Procedure Laterality Date  . ABDOMINAL HYSTERECTOMY    . CESAREAN SECTION    . COLONOSCOPY WITH PROPOFOL N/A 05/29/2015   Procedure: COLONOSCOPY WITH PROPOFOL;  Surgeon: Lucilla Lame, MD;  Location: [facility];  Service: Endoscopy;  Laterality: N/A;  Latex allergy sleep apnea - no CPAP machine (yet)  . POLYPECTOMY  05/29/2015   Procedure: POLYPECTOMY;  Surgeon: Lucilla Lame, MD;  Location: [facility];  Service: Endoscopy;;    Family History  Problem Relation Age of Onset  . Diabetes Sister   . Hypertension Sister   . Cirrhosis Mother   . Cirrhosis Father    . Breast cancer Sister 22  . Heart attack Brother     Social History   Social History  . Marital status: Married    Spouse name: N/A  . Number of children: N/A  . Years of education: N/A   Occupational History  . Not on file.   Social History Main Topics  . Smoking status: Never Smoker  . Smokeless tobacco: Never Used  . Alcohol use No  . Drug use: No  . Sexual activity: Yes    Partners: Male   Other Topics Concern  . Not on file   Social History Narrative   Lives   Caffeine use:      Current Outpatient Prescriptions:  .  ALPRAZolam (XANAX XR) 0.5 MG 24 hr tablet, Take 1 tablet (0.5 mg total) by mouth daily., Disp: 30 tablet, Rfl: 2 .  aspirin 81 MG EC tablet, Take 81 mg by mouth daily.  , Disp: , Rfl:  .  atorvastatin (LIPITOR) 40 MG tablet, Take 1 tablet (40 mg total) by mouth at bedtime., Disp: 90 tablet, Rfl: 0 .  Azelastine HCl  0.15 % SOLN, Place 2 sprays into both nostrils 2 (two) times daily. , Disp: , Rfl: 3 .  diclofenac (VOLTAREN) 75 MG EC tablet, Take 1 tablet (75 mg total) by mouth every 12 (twelve) hours as needed., Disp: 180 tablet, Rfl: 0 .  DULoxetine (CYMBALTA) 60 MG capsule, Take 1 capsule (60 mg total) by mouth daily. Daily, Disp: 90 capsule, Rfl: 1 .  EQ ALLERGY RELIEF 10 MG tablet, TAKE ONE TABLET BY MOUTH ONCE DAILY, Disp: 90 tablet, Rfl: 1 .  fluticasone (FLONASE) 50 MCG/ACT nasal spray, by Nasal route., Disp: , Rfl:  .  levothyroxine (SYNTHROID, LEVOTHROID) 88 MCG tablet, Take 1 tablet (88 mcg total) by mouth daily before breakfast., Disp: 90 tablet, Rfl: 0 .  LINZESS 145 MCG CAPS capsule, TAKE ONE CAPSULE BY MOUTH ONCE DAILY BEFORE BREAKFAST, Disp: 30 capsule, Rfl: 2 .  metoprolol succinate (TOPROL XL) 25 MG 24 hr tablet, Take 1 tablet (25 mg total) by mouth daily., Disp: 90 tablet, Rfl: 1 .  montelukast (SINGULAIR) 10 MG tablet, Take 1 tablet (10 mg total) by mouth at bedtime., Disp: 90 tablet, Rfl: 1 .  nitroGLYCERIN (NITROSTAT) 0.4 MG SL  tablet, Place 1 tablet (0.4 mg total) under the tongue every 5 (five) minutes as needed., Disp: 25 tablet, Rfl: 6 .  omeprazole (PRILOSEC) 40 MG capsule, Take 1 capsule (40 mg total) by mouth daily., Disp: 90 capsule, Rfl: 1 .  QUEtiapine (SEROQUEL) 25 MG tablet, Take 1 tablet (25 mg total) by mouth at bedtime., Disp: 90 tablet, Rfl: 1 .  rizatriptan (MAXALT) 10 MG tablet, Take 1 tablet (10 mg total) by mouth 3 (three) times daily as needed for migraine., Disp: 10 tablet, Rfl: 3 .  topiramate (TOPAMAX) 25 MG tablet, Take one tablet at night for one week, then take 2 tablets at night for one week, then take 3 tablets at night., Disp: 90 tablet, Rfl: 3 .  valsartan-hydrochlorothiazide (DIOVAN-HCT) 320-12.5 MG tablet, Take 1 tablet by mouth daily., Disp: 90 tablet, Rfl: 1  Allergies  Allergen Reactions  . Latex     Pt unsure of reaction pt states something happened while in the hospital after surgery.     ROS  Constitutional: Negative for fever or weight change.  Respiratory: Negative for cough and shortness of breath.   Cardiovascular: Negative for chest pain or palpitations.  Gastrointestinal: Positive  for abdominal pain, no bowel changes - doing well on Linzess.  Musculoskeletal: Positiive for gait problem but no joint swelling.  Skin: Negative for rash.  Neurological: Negative for dizziness, intermittent for headache.  No other specific complaints in a complete review of systems (except as listed in HPI above).  Objective  Vitals:   10/07/16 1059  BP: 132/78  Pulse: 81  Resp: 16  Temp: 98.1 F (36.7 C)  SpO2: 94%  Weight: 251 lb (113.9 kg)  Height: '5\' 4"'  (1.626 m)    Body mass index is 43.08 kg/m.  Physical Exam  Constitutional: Patient appears well-developed and well-nourished. Obese No distress.  HEENT: head atraumatic, normocephalic, pupils equal and reactive to light,  neck supple, throat within normal limits Cardiovascular: Normal rate, regular rhythm and normal  heart sounds.  No murmur heard. No BLE edema. Pulmonary/Chest: Effort normal and breath sounds normal. No respiratory distress. Abdominal: Soft.  There is no tenderness. Psychiatric: Patient has a normal mood and affect. behavior is normal. Judgment and thought content normal. Muscular Skeletal: negative straight leg raise, no synovitis. Wearing a back brace  Recent Results (from the past 2160 hour(s))  COMPLETE METABOLIC PANEL WITH GFR     Status: Abnormal   Collection Time: 07/09/16 11:20 AM  Result Value Ref Range   Sodium 142 135 - 146 mmol/L   Potassium 3.3 (L) 3.5 - 5.3 mmol/L   Chloride 101 98 - 110 mmol/L   CO2 28 20 - 31 mmol/L   Glucose, Bld 111 (H) 65 - 99 mg/dL   BUN 16 7 - 25 mg/dL   Creat 0.74 0.50 - 1.05 mg/dL    Comment:   For patients > or = 57 years of age: The upper reference limit for Creatinine is approximately 13% higher for people identified as African-American.      Total Bilirubin 0.4 0.2 - 1.2 mg/dL   Alkaline Phosphatase 63 33 - 130 U/L   AST 15 10 - 35 U/L   ALT 11 6 - 29 U/L   Total Protein 6.9 6.1 - 8.1 g/dL   Albumin 4.2 3.6 - 5.1 g/dL   Calcium 9.0 8.6 - 10.4 mg/dL   GFR, Est African American >89 >=60 mL/min   GFR, Est Non African American >89 >=60 mL/min  CBC with Differential/Platelet     Status: Abnormal   Collection Time: 07/09/16 11:20 AM  Result Value Ref Range   WBC 9.6 3.8 - 10.8 K/uL   RBC 4.09 3.80 - 5.10 MIL/uL   Hemoglobin 11.1 (L) 11.7 - 15.5 g/dL   HCT 33.3 (L) 35.0 - 45.0 %   MCV 81.4 80.0 - 100.0 fL   MCH 27.1 27.0 - 33.0 pg   MCHC 33.3 32.0 - 36.0 g/dL   RDW 17.0 (H) 11.0 - 15.0 %   Platelets 302 140 - 400 K/uL   MPV 9.5 7.5 - 12.5 fL   Neutro Abs 6,624 1,500 - 7,800 cells/uL   Lymphs Abs 2,208 850 - 3,900 cells/uL   Monocytes Absolute 672 200 - 950 cells/uL   Eosinophils Absolute 96 15 - 500 cells/uL   Basophils Absolute 0 0 - 200 cells/uL   Neutrophils Relative % 69 %   Lymphocytes Relative 23 %   Monocytes  Relative 7 %   Eosinophils Relative 1 %   Basophils Relative 0 %   Smear Review Criteria for review not met   Lipid panel     Status: Abnormal   Collection Time: 07/09/16 11:20 AM  Result Value Ref Range   Cholesterol 155 <200 mg/dL   Triglycerides 308 (H) <150 mg/dL   HDL 37 (L) >50 mg/dL   Total CHOL/HDL Ratio 4.2 <5.0 Ratio   VLDL 62 (H) <30 mg/dL   LDL Cholesterol 56 <100 mg/dL  Hemoglobin A1c     Status: Abnormal   Collection Time: 07/09/16 11:20 AM  Result Value Ref Range   Hgb A1c MFr Bld 6.2 (H) <5.7 %    Comment:   For someone without known diabetes, a hemoglobin A1c value between 5.7% and 6.4% is consistent with prediabetes and should be confirmed with a follow-up test.   For someone with known diabetes, a value <7% indicates that their diabetes is well controlled. A1c targets should be individualized based on duration of diabetes, age, co-morbid conditions and other considerations.   This assay result is consistent with an increased risk of diabetes.   Currently, no consensus exists regarding use of hemoglobin A1c for diagnosis of diabetes in children.      Mean Plasma Glucose 131 mg/dL  TSH     Status: Abnormal  Collection Time: 07/09/16 11:20 AM  Result Value Ref Range   TSH 0.14 (L) mIU/L    Comment:   Reference Range   > or = 20 Years  0.40-4.50   Pregnancy Range First trimester  0.26-2.66 Second trimester 0.55-2.73 Third trimester  0.43-2.91     Iron and TIBC     Status: None   Collection Time: 07/09/16 11:20 AM  Result Value Ref Range   Iron 54 45 - 160 ug/dL   UIBC 352 125 - 400 ug/dL   TIBC 406 250 - 450 ug/dL   %SAT 13 11 - 50 %  Ferritin     Status: None   Collection Time: 07/09/16 11:20 AM  Result Value Ref Range   Ferritin 17 10 - 232 ng/mL  CULTURE, URINE COMPREHENSIVE     Status: None   Collection Time: 07/09/16 11:22 AM  Result Value Ref Range   Culture ESCHERICHIA COLI     Comment: FASTING   Colony Count 50,000-100,000  CFU/mL    Organism ID, Bacteria ESCHERICHIA COLI       Susceptibility   Escherichia coli -  (no method available)    AMPICILLIN 8 Sensitive     AMOX/CLAVULANIC 4 Sensitive     AMPICILLIN/SULBACTAM 4 Sensitive     PIP/TAZO <=4 Sensitive     IMIPENEM <=0.25 Sensitive     CEFAZOLIN <=4 Not Reportable     CEFTRIAXONE <=1 Sensitive     CEFTAZIDIME <=1 Sensitive     CEFEPIME <=1 Sensitive     GENTAMICIN <=1 Sensitive     TOBRAMYCIN <=1 Sensitive     CIPROFLOXACIN <=0.25 Sensitive     LEVOFLOXACIN <=0.12 Sensitive     NITROFURANTOIN <=16 Sensitive     TRIMETH/SULFA* <=20 Sensitive      * NR=NOT REPORTABLE,SEE COMMENTORAL therapy:A cefazolin MIC of <32 predicts susceptibility to the oral agents cefaclor,cefdinir,cefpodoxime,cefprozil,cefuroxime,cephalexin,and loracarbef when used for therapy of uncomplicated UTIs due to E.coli,K.pneumomiae,and P.mirabilis. PARENTERAL therapy: A cefazolinMIC of >8 indicates resistance to parenteralcefazolin. An alternate test method must beperformed to confirm susceptibility to parenteralcefazolin.  Iron Binding Cap (TIBC)     Status: Abnormal   Collection Time: 09/09/16 10:25 AM  Result Value Ref Range   Iron 37 (L) 45 - 160 ug/dL   UIBC 400 125 - 400 ug/dL   TIBC 437 250 - 450 ug/dL   %SAT 8 (L) 11 - 50 %  Ferritin     Status: None   Collection Time: 09/09/16 10:25 AM  Result Value Ref Range   Ferritin 13 10 - 232 ng/mL  TSH     Status: Abnormal   Collection Time: 09/09/16 10:25 AM  Result Value Ref Range   TSH 0.20 (L) mIU/L    Comment:   Reference Range   > or = 20 Years  0.40-4.50   Pregnancy Range First trimester  0.26-2.66 Second trimester 0.55-2.73 Third trimester  0.43-2.91        PHQ2/9: Depression screen [facility] 2/9 10/07/2016 07/09/2016 04/08/2016 03/04/2016 02/02/2016  Decreased Interest '1 1 1 2 3  ' Down, Depressed, Hopeless '1 1 1 3 3  ' PHQ - 2 Score '2 2 2 5 6  ' Altered sleeping 1 1 0 3 3  Tired, decreased energy 0 0 0 3 3  Change in  appetite 0 0 1 2 0  Feeling bad or failure about yourself  0 0 '1 3 3  ' Trouble concentrating 0 1 0 0 3  Moving slowly or fidgety/restless 0 0 0 0  2  Suicidal thoughts 0 - 0 2 2  PHQ-9 Score '3 4 4 18 22  ' Difficult doing work/chores Somewhat difficult Somewhat difficult - Extremely dIfficult Very difficult     Fall Risk: Fall Risk  10/07/2016 07/09/2016 04/08/2016 02/02/2016 09/22/2015  Falls in the past year? No Yes Yes No No  Number falls in past yr: - 2 or more 1 - -  Injury with Fall? - No No - -  Follow up - Falls evaluation completed - - -     Assessment & Plan  1. Hypertension goal BP (blood pressure) < 140/90  - valsartan-hydrochlorothiazide (DIOVAN-HCT) 320-12.5 MG tablet; Take 1 tablet by mouth daily.  Dispense: 90 tablet; Refill: 1  2. Hyperlipidemia with target LDL less than 100  - atorvastatin (LIPITOR) 40 MG tablet; Take 1 tablet (40 mg total) by mouth at bedtime.  Dispense: 90 tablet; Refill: 0  3. GAD (generalized anxiety disorder)  - ALPRAZolam (XANAX XR) 0.5 MG 24 hr tablet; Take 1 tablet (0.5 mg total) by mouth daily.  Dispense: 30 tablet; Refill: 2  4. Insomnia, unspecified type  - QUEtiapine (SEROQUEL) 25 MG tablet; Take 1 tablet (25 mg total) by mouth at bedtime.  Dispense: 90 tablet; Refill: 1  5. Chronic pain of multiple joints  - diclofenac (VOLTAREN) 75 MG EC tablet; Take 1 tablet (75 mg total) by mouth every 12 (twelve) hours as needed.  Dispense: 180 tablet; Refill: 0 - Ambulatory referral to Orthopedic Surgery  6. Depression, major, recurrent, moderate ([place])  Doing well on medication  7. Angina pectoris ([place])  Seen by Cardiologist no recent episodes of chest pain, has NTG at home  8. OSA on CPAP  Wearing every night   9. Chronic constipation  Doing better on Linzess  10. Morbid obesity with BMI of 40.0-44.9, adult [facility])  Discussed with the patient the risk posed by an increased BMI. Discussed importance of portion control, calorie  counting and at least 150 minutes of physical activity weekly. Avoid sweet beverages and drink more water. Eat at least 6 servings of fruit and vegetables daily   11. Dysmetabolic syndrome  Discussed life style modification  12. Urinary frequency  Recurrent with low back pain - Ambulatory referral to Urology - Urine culture  13. Hypothyroidism due to acquired atrophy of thyroid  - TSH recheck in 2 weeks  14. Chronic midline low back pain without sciatica  - Ambulatory referral to Orthopedic Surgery

## 2016-10-09 LAB — URINE CULTURE: Organism ID, Bacteria: NO GROWTH

## 2016-10-14 ENCOUNTER — Other Ambulatory Visit: Payer: Self-pay | Admitting: Neurology

## 2016-10-15 NOTE — Telephone Encounter (Signed)
Called pt. Verified she is taking 3 tablets at night now. She is still having migraines. She started 3 tablets at night about 2 weeks ago. Advised it takes at least 4-6 weeks for the medication to reach max benefit and for her to notice if medication is helping or not. She should continue to take medication. She denies any SE to medication. If she develops new or worsening symptoms or SE, she should call. She verbalized understanding.   Called pt pharmacy. Spoke with [employer] She transferred me to pharm tech. Spoke with Butch Penny. She verified there is rx on file sent back on 09/20/16 qty90, 3 refills. No refills needed at this time. Will deny refill request.

## 2016-10-22 NOTE — Progress Notes (Signed)
10/23/2016 3:24 PM   Beverly Barrett 1960/12/14 [id number]  Referring provider: Steele Sizer, MD [address] [place] [employer], [place] [address]  Chief Complaint  Patient presents with  . New Patient (Initial Visit)    Urinary Frequency referred by Dr. Ancil Boozer    HPI: Patient is a 56 -year-old [place] American female who is referred to [place] by, Dr. Ancil Boozer, for urinary incontinence and recurrent UTI's who presents with her daughter, Beverly Barrett.    Patient states that she has had urinary incontinence for three years.   Patient has incontinence with urge and stress.   She is experiencing 2 to 3 incontinent episodes during the day. She is experiencing "a few times" incontinent episodes during the night.  Her incontinence volume is moderate.   She is wearing pads/depends when she leaves the house.  She is having associated urgency x 5 and nocturia x 4.    She does not have a history of urinary tract infections, STI's or injury to the bladder.   She endorses suprapubic pain x 2 months, back pain, abdominal pain and flank pain.   8/10 pain.  She describes the pain as throbbing.  Going from a sitting position to a standing position and the urge to urinate make the pain worse.  Emptying the bladder and laying down with her knees tucked help the pain.   She has nausea, but she has not had any recent fevers, chills, or vomiting.   She does have a history of GU surgery (bladder tacking), but she does not have a history of nephrolithiasis or GU trauma.    She is sexually active.  She feels like something is being bumped in her vagina during sex.  She has noted incontinence with sexual intercourse.  She has not noted a correlation with her urinary tract infections and sexual intercourse.  She does not engage in anal sex.   She is voiding before and after sex.    She is post menopausal.   She admits to constipation.  She is having pain with bladder filling.    She has not had any recent imaging  studies.    She is drinking 3 or 4 bottles of water daily.   She is drinking 1 cup caffeinated beverages daily.  She is not drinking alcoholic beverages daily.    Her risk factors for incontinence are obesity, vaginal delivery, a family history of incontinence, age, caffeine, depression, vaginal atrophy and pelvic surgery.     She is taking benzo's, diuretics, antidepressants,     Patient states that she has had two urinary tract infections over the last year.  Her symptoms with a urinary tract infection consist of fevers and chills.  Reviewing her records,  she has had 3 documented positive urine cultures for Escherichia coli over the last 2 years.  She does engage in good perineal hygiene. She does not take tub baths.   Her PVR is 94 mL.    PMH: Past Medical History:  Diagnosis Date  . Anginal pain ([place])    none in approx 2 yrs  . Anxiety   . Asthma   . Chronic lower back pain   . Common migraine with intractable migraine 09/20/2016  . Depression    suicidal ideation  . GERD (gastroesophageal reflux disease)   . Headache    migraines -2x/month  . Hyperlipidemia   . Hypertension   . Hypothyroidism   . Motion sickness    cars  . Shortness of breath  dyspnea   . Sleep apnea    No machine  . Vertigo   . Wears dentures    partial upper    Surgical History: Past Surgical History:  Procedure Laterality Date  . ABDOMINAL HYSTERECTOMY    . CESAREAN SECTION    . COLONOSCOPY WITH PROPOFOL N/A 05/29/2015   Procedure: COLONOSCOPY WITH PROPOFOL;  Surgeon: Lucilla Lame, MD;  Location: [facility];  Service: Endoscopy;  Laterality: N/A;  Latex allergy sleep apnea - no CPAP machine (yet)  . POLYPECTOMY  05/29/2015   Procedure: POLYPECTOMY;  Surgeon: Lucilla Lame, MD;  Location: [facility];  Service: Endoscopy;;    Home Medications:  Allergies as of 10/23/2016      Reactions   Latex    Pt unsure of reaction pt states something happened while in the hospital after  surgery.      Medication List       Accurate as of 10/23/16  3:24 PM. Always use your most recent med list.          ALPRAZolam 0.5 MG 24 hr tablet Commonly known as:  XANAX XR Take 1 tablet (0.5 mg total) by mouth daily.   aspirin 81 MG EC tablet Take 81 mg by mouth daily.   atorvastatin 40 MG tablet Commonly known as:  LIPITOR Take 1 tablet (40 mg total) by mouth at bedtime.   Azelastine HCl 0.15 % Soln Place 2 sprays into both nostrils 2 (two) times daily.   diclofenac 75 MG EC tablet Commonly known as:  VOLTAREN Take 1 tablet (75 mg total) by mouth every 12 (twelve) hours as needed.   DULoxetine 60 MG capsule Commonly known as:  CYMBALTA Take 1 capsule (60 mg total) by mouth daily. Daily   EQ ALLERGY RELIEF 10 MG tablet Generic drug:  loratadine TAKE ONE TABLET BY MOUTH ONCE DAILY   fluticasone 50 MCG/ACT nasal spray Commonly known as:  FLONASE by Nasal route.   levothyroxine 88 MCG tablet Commonly known as:  SYNTHROID, LEVOTHROID Take 1 tablet (88 mcg total) by mouth daily before breakfast.   LINZESS 145 MCG Caps capsule Generic drug:  linaclotide TAKE ONE CAPSULE BY MOUTH ONCE DAILY BEFORE BREAKFAST   metoprolol succinate 25 MG 24 hr tablet Commonly known as:  TOPROL XL Take 1 tablet (25 mg total) by mouth daily.   montelukast 10 MG tablet Commonly known as:  SINGULAIR Take 1 tablet (10 mg total) by mouth at bedtime.   nitroGLYCERIN 0.4 MG SL tablet Commonly known as:  NITROSTAT Place 1 tablet (0.4 mg total) under the tongue every 5 (five) minutes as needed.   omeprazole 40 MG capsule Commonly known as:  PRILOSEC Take 1 capsule (40 mg total) by mouth daily.   QUEtiapine 25 MG tablet Commonly known as:  SEROQUEL Take 1 tablet (25 mg total) by mouth at bedtime.   rizatriptan 10 MG tablet Commonly known as:  MAXALT Take 1 tablet (10 mg total) by mouth 3 (three) times daily as needed for migraine.   topiramate 25 MG tablet Commonly known as:   TOPAMAX Take one tablet at night for one week, then take 2 tablets at night for one week, then take 3 tablets at night.   valsartan-hydrochlorothiazide 320-12.5 MG tablet Commonly known as:  DIOVAN-HCT Take 1 tablet by mouth daily.       Allergies:  Allergies  Allergen Reactions  . Latex     Pt unsure of reaction pt states something happened while in the  hospital after surgery.    Family History: Family History  Problem Relation Age of Onset  . Diabetes Sister   . Hypertension Sister   . Cirrhosis Mother   . Cirrhosis Father   . Breast cancer Sister 72  . Heart attack Brother   . Prostate cancer Neg Hx   . Kidney cancer Neg Hx   . Bladder Cancer Neg Hx     Social History:  reports that she has never smoked. She has never used smokeless tobacco. She reports that she does not drink alcohol or use drugs.  ROS: UROLOGY Frequent Urination?: No Hard to postpone urination?: Yes Burning/pain with urination?: No Get up at night to urinate?: Yes Leakage of urine?: Yes Urine stream starts and stops?: No Trouble starting stream?: No Do you have to strain to urinate?: No Blood in urine?: No Urinary tract infection?: Yes Sexually transmitted disease?: No Injury to kidneys or bladder?: No Painful intercourse?: No Weak stream?: Yes Currently pregnant?: No Vaginal bleeding?: No Last menstrual period?: n  Gastrointestinal Nausea?: Yes Vomiting?: No Indigestion/heartburn?: Yes Diarrhea?: No Constipation?: Yes  Constitutional Fever: No Night sweats?: Yes Weight loss?: No Fatigue?: No  Skin Skin rash/lesions?: No Itching?: No  Eyes Blurred vision?: No Double vision?: No  Ears/Nose/Throat Sore throat?: Yes Sinus problems?: Yes  Hematologic/Lymphatic Swollen glands?: No Easy bruising?: No  Cardiovascular Leg swelling?: No Chest pain?: Yes  Respiratory Cough?: No Shortness of breath?: Yes  Endocrine Excessive thirst?: No  Musculoskeletal Back  pain?: Yes Joint pain?: Yes  Neurological Headaches?: Yes Dizziness?: Yes  Psychologic Depression?: Yes Anxiety?: Yes  Physical Exam: BP 103/69   Pulse 73   Ht _0  (1.651 m)   Wt 246 lb 4.8 oz (111.7 kg)   BMI 40.99 kg/m   Constitutional: Well nourished. Alert and oriented, No acute distress. HEENT: [place] AT, moist mucus membranes. Trachea midline, no masses. Cardiovascular: No clubbing, cyanosis, or edema. Respiratory: Normal respiratory effort, no increased work of breathing. GI: Abdomen is soft, non tender, non distended, no abdominal masses. Liver and spleen not palpable.  No hernias appreciated.  Stool sample for occult testing is not indicated.   GU: No CVA tenderness.  No bladder fullness or masses.  Normal external genitalia, normal pubic hair distribution, no lesions.  Normal urethral meatus, no lesions, no prolapse, no discharge.   No urethral masses, tenderness and/or tenderness. No bladder fullness, tenderness or masses. Normal vagina mucosa, good estrogen effect, no discharge, no lesions, good pelvic support, no cystocele or rectocele noted.  Cervix, uterus and adnexa are surgically absent.  Anus and perineum are without rashes or lesions.    Skin: No rashes, bruises or suspicious lesions. Lymph: No cervical or inguinal adenopathy. Neurologic: Grossly intact, no focal deficits, moving all 4 extremities. Psychiatric: Normal mood and affect.  Laboratory Data: Lab Results  Component Value Date   WBC 9.6 07/09/2016   HGB 11.1 (L) 07/09/2016   HCT 33.3 (L) 07/09/2016   MCV 81.4 07/09/2016   PLT 302 07/09/2016    Lab Results  Component Value Date   CREATININE 0.74 07/09/2016     Lab Results  Component Value Date   HGBA1C 6.2 (H) 07/09/2016    Lab Results  Component Value Date   TSH 0.20 (L) 09/09/2016       Component Value Date/Time   CHOL 155 07/09/2016 1120   CHOL 192 12/20/2015 1012   HDL 37 (L) 07/09/2016 1120   HDL 46 12/20/2015 1012   CHOLHDL  4.2  07/09/2016 1120   VLDL 62 (H) 07/09/2016 1120   LDLCALC 56 07/09/2016 1120   LDLCALC 103 (H) 12/20/2015 1012    Lab Results  Component Value Date   AST 15 07/09/2016   Lab Results  Component Value Date   ALT 11 07/09/2016     Urinalysis 11-30 WBC's.  Many bacteria.  See EPIC.    Pertinent Imaging: Results for LIDYA, MCCALISTER (MRN [id number]) as of 10/23/2016 14:45  Ref. Range 10/23/2016 14:16  Scan Result Unknown 94    Assessment & Plan:    1. Mixed urinary Incontinence  - offered behavioral therapies, bladder training, bladder control strategies and pelvic floor muscle training - deferred  - fluid management - good fluid intake  - offered medical therapy with anticholinergic therapy or beta-3 adrenergic receptor agonist and the potential side effects of each therapy - patient would like to try a beta-3 adrenergic receptor agonist as she already suffers with constipation  - would like to try the beta-3 adrenergic receptor agonist (Myrbetriq).  Given Myrbetriq 25 mg samples, #28.  I have reviewed with the patient of the side effects of Myrbetriq, such as: elevation in BP, urinary retention and/or HA.    - RTC in 3 weeks for PVR and symptom recheck   2. Suprapubic pain  - criteria for recurrent UTI has NOT been met with 2 or more infections in 6 months or 3 or greater infections in one year   - Patient to continue their water intake until the urine is pale yellow or clear (10 to 12 cups daily)   - probiotics (yogurt, oral pills or vaginal suppositories), take cranberry pills or drink the juice and Vitamin C 1,000 mg daily to acidify the urine should be added to their daily regimen   - avoid soaking in tubs and wipe front to back after urinating   - benefit from core strengthening exercises has been seen.  We can refer her to PT if they desire - deferred  - advised them to have CATH UA's for urinalysis and culture to prevent skin contamination of the specimen  - reviewed  symptoms of UTI and advised not to have urine checked or be treated for UTI if not experiencing symptoms  - discussed antibiotic stewardship with the patient                                   3. Vaginal atrophy  - I explained to the patient that when women go through menopause and her estrogen levels are severely diminished, the normal vaginal flora will change.  This is due to an increase of the vaginal canal's pH. Because of this, the vaginal canal may be colonized by bacteria from the rectum instead of the protective lactobacillus.  This accompanied by the loss of the mucus barrier with vaginal atrophy is a cause of recurrent urinary tract infections.  - I explained to the patient that her menopausal state causes her vaginal tissue to become dry, inflamed and thin and this is most likely the cause of her dyspareunia             - In some studies, the use of vaginal estrogen cream has been demonstrated to reduce  recurrent urinary tract infections to one a year.   - Patient was given a sample of vaginal estrogen cream (Premarin) and instructed to apply 0.73m (pea-sized amount)  just inside the vaginal introitus with  a finger-tip every night for two weeks and then Monday, Wednesday and Friday nights.  I explained to the patient that vaginally administered estrogen, which causes only a slight increase in the blood estrogen levels, have fewer contraindications and adverse systemic effects that oral HT.  - She will follow up in three months for an exam.    Return in about 3 weeks (around 11/13/2016) for PVR and OAB questionnaire.  These notes generated with voice recognition software. I apologize for typographical errors.  Zara Council, [facility] [address] [place],  [address] [phone]

## 2016-10-23 ENCOUNTER — Ambulatory Visit: Payer: BLUE CROSS/BLUE SHIELD | Admitting: Urology

## 2016-10-23 ENCOUNTER — Encounter: Payer: Self-pay | Admitting: Urology

## 2016-10-23 VITALS — BP 103/69 | HR 73 | Ht 65.0 in | Wt 246.3 lb

## 2016-10-23 DIAGNOSIS — R102 Pelvic and perineal pain: Secondary | ICD-10-CM | POA: Diagnosis not present

## 2016-10-23 DIAGNOSIS — N952 Postmenopausal atrophic vaginitis: Secondary | ICD-10-CM | POA: Diagnosis not present

## 2016-10-23 DIAGNOSIS — N3946 Mixed incontinence: Secondary | ICD-10-CM | POA: Diagnosis not present

## 2016-10-23 DIAGNOSIS — R350 Frequency of micturition: Secondary | ICD-10-CM

## 2016-10-23 LAB — URINALYSIS, COMPLETE
Bilirubin, UA: NEGATIVE
GLUCOSE, UA: NEGATIVE
NITRITE UA: NEGATIVE
Protein, UA: NEGATIVE
SPEC GRAV UA: 1.02 (ref 1.005–1.030)
UUROB: 0.2 mg/dL (ref 0.2–1.0)
pH, UA: 7 (ref 5.0–7.5)

## 2016-10-23 LAB — MICROSCOPIC EXAMINATION: Epithelial Cells (non renal): >10 /hpf — ABNORMAL HIGH (ref 0–10)

## 2016-10-23 LAB — BLADDER SCAN AMB NON-IMAGING: SCAN RESULT: 94

## 2016-10-28 LAB — CULTURE, URINE COMPREHENSIVE

## 2016-10-29 ENCOUNTER — Telehealth: Payer: Self-pay

## 2016-10-29 NOTE — Telephone Encounter (Signed)
Spoke with pt in reference to -ucx. Pt voiced understanding.  

## 2016-10-29 NOTE — Telephone Encounter (Signed)
-----   Message from Nori Riis, PA-C sent at 10/28/2016  8:31 PM EDT ----- Please let the patient know that her urine culture was negative.

## 2016-11-04 ENCOUNTER — Other Ambulatory Visit: Payer: Self-pay | Admitting: Orthopedic Surgery

## 2016-11-04 DIAGNOSIS — M47816 Spondylosis without myelopathy or radiculopathy, lumbar region: Secondary | ICD-10-CM

## 2016-11-04 DIAGNOSIS — M5416 Radiculopathy, lumbar region: Secondary | ICD-10-CM

## 2016-11-12 ENCOUNTER — Ambulatory Visit (INDEPENDENT_AMBULATORY_CARE_PROVIDER_SITE_OTHER): Payer: BLUE CROSS/BLUE SHIELD | Admitting: Family Medicine

## 2016-11-12 ENCOUNTER — Encounter: Payer: Self-pay | Admitting: Family Medicine

## 2016-11-12 VITALS — BP 112/72 | HR 87 | Temp 98.1°F | Resp 16 | Ht 65.0 in | Wt 245.4 lb

## 2016-11-12 DIAGNOSIS — M4726 Other spondylosis with radiculopathy, lumbar region: Secondary | ICD-10-CM

## 2016-11-12 DIAGNOSIS — E034 Atrophy of thyroid (acquired): Secondary | ICD-10-CM | POA: Diagnosis not present

## 2016-11-12 DIAGNOSIS — N3946 Mixed incontinence: Secondary | ICD-10-CM | POA: Diagnosis not present

## 2016-11-12 DIAGNOSIS — D508 Other iron deficiency anemias: Secondary | ICD-10-CM | POA: Diagnosis not present

## 2016-11-12 DIAGNOSIS — M47816 Spondylosis without myelopathy or radiculopathy, lumbar region: Secondary | ICD-10-CM | POA: Insufficient documentation

## 2016-11-12 LAB — FECAL OCCULT BLOOD, GUAIAC: FECAL OCCULT BLD: NEGATIVE

## 2016-11-12 LAB — POC HEMOCCULT BLD/STL (HOME/3-CARD/SCREEN)
Card #2 Fecal Occult Blod, POC: NEGATIVE
FECAL OCCULT BLD: NEGATIVE
Fecal Occult Blood, POC: NEGATIVE

## 2016-11-12 LAB — TSH: TSH: 4.2 m[IU]/L

## 2016-11-12 NOTE — Progress Notes (Signed)
Name: Beverly Barrett   MRN: [id number]    DOB: 1961/05/10   Date:11/12/2016       Progress Note  Subjective  Chief Complaint  Chief Complaint  Patient presents with  . Follow-up    3 week F/U  . Depression  . Hypothyroidism    HPI   Urinary urgency and incontinence: symptoms of low back and supra pubic pain again, urgency, nocturia, and sometimes she cannot make it to the bathroom. Seen by Urologist PA and is on Myrbetric and also premarin for vaginal atrophy, she has notice mild improvement of symptoms, but still had one episode of nocturnal enuresis this past weekend.   Hypothyroidism she is currently taking medication daily, she has chronic dry skin and constipation ( resolved with Linzes), last TSH was suppressed, we will recheck levels , on Synthroid 88 mcg daily for the 6 weeks  DDD lumbar spine: had MRI done, ordered by Ortho and is waiting for them to contact her with appointment for steroid injection, pain is still described aching, constant, now it is a 7/10  Back braces helps with pain, still radiating down both thighs.   Patient Active Problem List   Diagnosis Date Noted  . Mixed stress and urge urinary incontinence 11/12/2016  . Degenerative arthritis of lumbar spine 11/12/2016  . Common migraine with intractable migraine 09/20/2016  . Iron deficiency anemia 09/12/2016  . GAD (generalized anxiety disorder) 12/20/2015  . OSA on CPAP 12/20/2015  . Angina effort ([place]) 12/20/2015  . Hyperglycemia 09/22/2015  . History of colonic polyps   . Benign neoplasm of sigmoid colon   . Frequency 04/14/2015  . Midline low back pain without sciatica 04/14/2015  . Chronic pain of multiple joints 06/14/2014  . Dysmetabolic syndrome 23/55/7322  . Acid reflux 06/14/2014  . Extreme obesity 06/14/2014  . Arthritis, degenerative 06/14/2014  . Morbid obesity with BMI of 40.0-44.9, adult ([place]) 06/14/2014  . Hypothyroidism due to acquired atrophy of thyroid 08/18/2013  .  Hyperlipidemia 12/07/2010  . Hypertension goal BP (blood pressure) < 140/90 12/07/2010  . Familial multiple lipoprotein-type hyperlipidemia 12/07/2010  . CN (constipation) 11/21/2009  . Allergic rhinitis 10/13/2008  . Asthma, mild intermittent, well-controlled 09/01/2008  . Major depressive disorder, recurrent episode, in partial remission with anxious distress ([place]) 08/13/2007    Past Surgical History:  Procedure Laterality Date  . ABDOMINAL HYSTERECTOMY    . CESAREAN SECTION    . COLONOSCOPY WITH PROPOFOL N/A 05/29/2015   Procedure: COLONOSCOPY WITH PROPOFOL;  Surgeon: Lucilla Lame, MD;  Location: [facility];  Service: Endoscopy;  Laterality: N/A;  Latex allergy sleep apnea - no CPAP machine (yet)  . POLYPECTOMY  05/29/2015   Procedure: POLYPECTOMY;  Surgeon: Lucilla Lame, MD;  Location: [facility];  Service: Endoscopy;;    Family History  Problem Relation Age of Onset  . Diabetes Sister   . Hypertension Sister   . Cirrhosis Mother   . Cirrhosis Father   . Breast cancer Sister 59  . Heart attack Brother   . Prostate cancer Neg Hx   . Kidney cancer Neg Hx   . Bladder Cancer Neg Hx     Social History   Social History  . Marital status: Married    Spouse name: N/A  . Number of children: N/A  . Years of education: N/A   Occupational History  . Not on file.   Social History Main Topics  . Smoking status: Never Smoker  . Smokeless tobacco: Never Used  . Alcohol  use No  . Drug use: No  . Sexual activity: Yes    Partners: Male   Other Topics Concern  . Not on file   Social History Narrative   Lives   Caffeine use:      Current Outpatient Prescriptions:  .  conjugated estrogens (PREMARIN) vaginal cream, Place 1 Applicatorful vaginally daily., Disp: , Rfl:  .  mirabegron ER (MYRBETRIQ) 25 MG TB24 tablet, Take 25 mg by mouth daily., Disp: , Rfl:  .  ALPRAZolam (XANAX XR) 0.5 MG 24 hr tablet, Take 1 tablet (0.5 mg total) by mouth daily., Disp: 30  tablet, Rfl: 2 .  aspirin 81 MG EC tablet, Take 81 mg by mouth daily.  , Disp: , Rfl:  .  atorvastatin (LIPITOR) 40 MG tablet, Take 1 tablet (40 mg total) by mouth at bedtime., Disp: 90 tablet, Rfl: 0 .  Azelastine HCl 0.15 % SOLN, Place 2 sprays into both nostrils 2 (two) times daily. , Disp: , Rfl: 3 .  diclofenac (VOLTAREN) 75 MG EC tablet, Take 1 tablet (75 mg total) by mouth every 12 (twelve) hours as needed., Disp: 180 tablet, Rfl: 0 .  DULoxetine (CYMBALTA) 60 MG capsule, Take 1 capsule (60 mg total) by mouth daily. Daily, Disp: 90 capsule, Rfl: 1 .  EQ ALLERGY RELIEF 10 MG tablet, TAKE ONE TABLET BY MOUTH ONCE DAILY, Disp: 90 tablet, Rfl: 1 .  fluticasone (FLONASE) 50 MCG/ACT nasal spray, by Nasal route., Disp: , Rfl:  .  levothyroxine (SYNTHROID, LEVOTHROID) 88 MCG tablet, Take 1 tablet (88 mcg total) by mouth daily before breakfast., Disp: 90 tablet, Rfl: 0 .  LINZESS 145 MCG CAPS capsule, TAKE ONE CAPSULE BY MOUTH ONCE DAILY BEFORE BREAKFAST, Disp: 30 capsule, Rfl: 2 .  metoprolol succinate (TOPROL XL) 25 MG 24 hr tablet, Take 1 tablet (25 mg total) by mouth daily., Disp: 90 tablet, Rfl: 1 .  montelukast (SINGULAIR) 10 MG tablet, Take 1 tablet (10 mg total) by mouth at bedtime., Disp: 90 tablet, Rfl: 1 .  nitroGLYCERIN (NITROSTAT) 0.4 MG SL tablet, Place 1 tablet (0.4 mg total) under the tongue every 5 (five) minutes as needed., Disp: 25 tablet, Rfl: 6 .  omeprazole (PRILOSEC) 40 MG capsule, Take 1 capsule (40 mg total) by mouth daily., Disp: 90 capsule, Rfl: 1 .  QUEtiapine (SEROQUEL) 25 MG tablet, Take 1 tablet (25 mg total) by mouth at bedtime., Disp: 90 tablet, Rfl: 1 .  rizatriptan (MAXALT) 10 MG tablet, Take 1 tablet (10 mg total) by mouth 3 (three) times daily as needed for migraine., Disp: 10 tablet, Rfl: 3 .  topiramate (TOPAMAX) 25 MG tablet, Take one tablet at night for one week, then take 2 tablets at night for one week, then take 3 tablets at night., Disp: 90 tablet, Rfl: 3 .   valsartan-hydrochlorothiazide (DIOVAN-HCT) 320-12.5 MG tablet, Take 1 tablet by mouth daily., Disp: 90 tablet, Rfl: 1  Allergies  Allergen Reactions  . Latex     Pt unsure of reaction pt states something happened while in the hospital after surgery.     ROS   Constitutional: Negative for fever or weight change.  Respiratory: Negative for cough and shortness of breath.   Cardiovascular: Negative for chest pain or palpitations.  Gastrointestinal: Positive  for abdominal pain, no bowel changes - doing well on Linzess.  Musculoskeletal: Positiive for gait problem but no joint swelling.  Skin: Negative for rash.  Neurological: Negative for dizziness, intermittent for headache.  No other specific  complaints in a complete review of systems (except as listed in HPI above).   Objective  Vitals:   11/12/16 0946  BP: 112/72  Pulse: 87  Resp: 16  Temp: 98.1 F (36.7 C)  SpO2: 93%  Weight: 245 lb 7 oz (111.3 kg)  Height: 5\' 5"  (1.651 m)    Body mass index is 40.84 kg/m.  Physical Exam  Constitutional: Patient appears well-developed  Obese No distress.  HEENT: head atraumatic, normocephalic, pupils equal and reactive to light,  neck supple, throat within normal limits Cardiovascular: Normal rate, regular rhythm and normal heart sounds.  No murmur heard. No BLE edema. Pulmonary/Chest: Effort normal and breath sounds normal. No respiratory distress. Abdominal: Soft.  There is no tenderness. Psychiatric: Patient has a normal mood and affect. behavior is normal. Judgment and thought content normal. Muscular Skeletal: negative straight leg raise, no synovitis. Wearing a back brace  Recent Results (from the past 2160 hour(s))  Iron Binding Cap (TIBC)     Status: Abnormal   Collection Time: 09/09/16 10:25 AM  Result Value Ref Range   Iron 37 (L) 45 - 160 ug/dL   UIBC 400 125 - 400 ug/dL   TIBC 437 250 - 450 ug/dL   %SAT 8 (L) 11 - 50 %  Ferritin     Status: None   Collection  Time: 09/09/16 10:25 AM  Result Value Ref Range   Ferritin 13 10 - 232 ng/mL  TSH     Status: Abnormal   Collection Time: 09/09/16 10:25 AM  Result Value Ref Range   TSH 0.20 (L) mIU/L    Comment:   Reference Range   > or = 20 Years  0.40-4.50   Pregnancy Range First trimester  0.26-2.66 Second trimester 0.55-2.73 Third trimester  0.43-2.91     Urine Culture     Status: None   Collection Time: 10/07/16 11:54 AM  Result Value Ref Range   Organism ID, Bacteria NO GROWTH   BLADDER SCAN AMB NON-IMAGING     Status: None   Collection Time: 10/23/16  2:16 PM  Result Value Ref Range   Scan Result 94   Urinalysis, Complete     Status: Abnormal   Collection Time: 10/23/16  3:32 PM  Result Value Ref Range   Specific Gravity, UA 1.020 1.005 - 1.030   pH, UA 7.0 5.0 - 7.5   Color, UA Yellow Yellow   Appearance Ur Clear Clear   Leukocytes, UA 2+ (A) Negative   Protein, UA Negative Negative/Trace   Glucose, UA Negative Negative   Ketones, UA Trace (A) Negative   RBC, UA Trace (A) Negative   Bilirubin, UA Negative Negative   Urobilinogen, Ur 0.2 0.2 - 1.0 mg/dL   Nitrite, UA Negative Negative   Microscopic Examination See below:   Microscopic Examination     Status: Abnormal   Collection Time: 10/23/16  3:32 PM  Result Value Ref Range   WBC, UA 11-30 (A) 0 - 5 /hpf   RBC, UA 0-2 0 - 2 /hpf   Epithelial Cells (non renal) >10 (H) 0 - 10 /hpf   Mucus, UA Present (A) Not Estab.   Bacteria, UA Many (A) None seen/Few  CULTURE, URINE COMPREHENSIVE     Status: None   Collection Time: 10/23/16  3:51 PM  Result Value Ref Range   Urine Culture, Comprehensive Final report    Result 1 Lactobacillus species     Comment: Greater than 100,000 colony forming units per mL Susceptibility  not normally performed on this organism.    Result 2 Comment     Comment: Mixed urogenital flora 4,000 Colonies/mL      PHQ2/9: Depression screen [facility] 2/9 10/07/2016 07/09/2016 04/08/2016 03/04/2016  02/02/2016  Decreased Interest 1 1 1 2 3   Down, Depressed, Hopeless 1 1 1 3 3   PHQ - 2 Score 2 2 2 5 6   Altered sleeping 1 1 0 3 3  Tired, decreased energy 0 0 0 3 3  Change in appetite 0 0 1 2 0  Feeling bad or failure about yourself  0 0 1 3 3   Trouble concentrating 0 1 0 0 3  Moving slowly or fidgety/restless 0 0 0 0 2  Suicidal thoughts 0 - 0 2 2  PHQ-9 Score 3 4 4 18 22   Difficult doing work/chores Somewhat difficult Somewhat difficult - Extremely dIfficult Very difficult     Fall Risk: Fall Risk  10/07/2016 07/09/2016 04/08/2016 02/02/2016 09/22/2015  Falls in the past year? No Yes Yes No No  Number falls in past yr: - 2 or more 1 - -  Injury with Fall? - No No - -  Follow up - Falls evaluation completed - - -     Assessment & Plan  1. Hypothyroidism due to acquired atrophy of thyroid  - TSH  2. Mixed stress and urge urinary incontinence  She will keep follow up with Urologist   3. Osteoarthritis of spine with radiculopathy, lumbar region  She will have steroid injections as recommended by Ortho and will go back for shoulder pain evaluation also

## 2016-11-12 NOTE — Addendum Note (Signed)
Addended by: Inda Coke on: 11/12/2016 10:38 AM   Modules accepted: Orders

## 2016-11-13 ENCOUNTER — Encounter: Payer: Self-pay | Admitting: Family Medicine

## 2016-11-13 NOTE — Progress Notes (Signed)
11/14/2016 11:01 AM   Beverly Barrett [birth year] [id number]  Referring provider: Steele Sizer, MD [address] [place], [place] [address]  Chief Complaint  Patient presents with  . Urinary Incontinence    mixed 3 week follow up  . Vaginal Atrophy    HPI: 56 yo AAF who presents today for a 3 week follow up after starting Myrbetriq for mixed urinary incontinence and Premarin for vaginal atrophy.  Background history Patient is a 14 -year-old [place] American female who is referred to [place] by, Dr. Ancil Boozer, for urinary incontinence and recurrent UTI's who presents with her daughter, Beverly Barrett.  Patient states that she has had urinary incontinence for three years.   Patient has incontinence with urge and stress.   She is experiencing 2 to 3 incontinent episodes during the day. She is experiencing "a few times" incontinent episodes during the night.  Her incontinence volume is moderate.   She is wearing pads/depends when she leaves the house.  She is having associated urgency x 5 and nocturia x 4.  She does not have a history of urinary tract infections, STI's or injury to the bladder.  She endorses suprapubic pain x 2 months, back pain, abdominal pain and flank pain.   8/10 pain.  She describes the pain as throbbing.  Going from a sitting position to a standing position and the urge to urinate make the pain worse.  Emptying the bladder and laying down with her knees tucked help the pain.   She has nausea, but she has not had any recent fevers, chills, or vomiting.  She does have a history of GU surgery (bladder tacking), but she does not have a history of nephrolithiasis or GU trauma.   She is sexually active.  She feels like something is being bumped in her vagina during sex.  She has noted incontinence with sexual intercourse.  She has not noted a correlation with her urinary tract infections and sexual intercourse.  She does not engage in anal sex.   She is voiding before and after sex.    She is post menopausal.   She admits to constipation.  She is having pain with bladder filling.  She has not had any recent imaging studies.  She is drinking 3 or 4 bottles of water daily.   She is drinking 1 cup caffeinated beverages daily.  She is not drinking alcoholic beverages daily.  Her risk factors for incontinence are obesity, vaginal delivery, a family history of incontinence, age, caffeine, depression, vaginal atrophy and pelvic surgery.   She is taking benzo's, diuretics and antidepressants. Patient states that she has had two urinary tract infections over the last year.  Her symptoms with a urinary tract infection consist of fevers and chills.  Reviewing her records,  she has had 3 documented positive urine cultures for Escherichia coli over the last 2 years.  She does engage in good perineal hygiene. She does not take tub baths.   Her PVR is 94 mL.    Mixed urinary incontinence The patient has been experiencing urgency x 4-7 (stable), frequency x 8 or more (stable), is restricting fluids to avoid visits to the restroom, is engaging in toilet mapping, incontinence x 0-3 (stable) and nocturia x 4-7 (stable).   Her PVR is 0 mL.  she feels the Myrbetriq is helping and will like to continue the medication.  Vaginal atrophy She is applying the vaginal cream 3 nights weekly. She's not had vaginal irritation or rash.  PMH: Past Medical History:  Diagnosis Date  . Anginal pain ([place])    none in approx 2 yrs  . Anxiety   . Asthma   . Chronic lower back pain   . Common migraine with intractable migraine 09/20/2016  . Depression    suicidal ideation  . GERD (gastroesophageal reflux disease)   . Headache    migraines -2x/month  . Hyperlipidemia   . Hypertension   . Hypothyroidism   . Motion sickness    cars  . Shortness of breath dyspnea   . Sleep apnea    No machine  . Vertigo   . Wears dentures    partial upper    Surgical History: Past Surgical History:  Procedure Laterality  Date  . ABDOMINAL HYSTERECTOMY    . bladder tach  1995  . CESAREAN SECTION    . COLONOSCOPY WITH PROPOFOL N/A 05/29/2015   Procedure: COLONOSCOPY WITH PROPOFOL;  Surgeon: Lucilla Lame, MD;  Location: [facility];  Service: Endoscopy;  Laterality: N/A;  Latex allergy sleep apnea - no CPAP machine (yet)  . POLYPECTOMY  05/29/2015   Procedure: POLYPECTOMY;  Surgeon: Lucilla Lame, MD;  Location: [facility];  Service: Endoscopy;;    Home Medications:  Allergies as of 11/14/2016      Reactions   Latex    Pt unsure of reaction pt states something happened while in the hospital after surgery.      Medication List       Accurate as of 11/14/16 11:01 AM. Always use your most recent med list.          ALPRAZolam 0.5 MG 24 hr tablet Commonly known as:  XANAX XR Take 1 tablet (0.5 mg total) by mouth daily.   aspirin 81 MG EC tablet Take 81 mg by mouth daily.   atorvastatin 40 MG tablet Commonly known as:  LIPITOR Take 1 tablet (40 mg total) by mouth at bedtime.   Azelastine HCl 0.15 % Soln Place 2 sprays into both nostrils 2 (two) times daily.   conjugated estrogens vaginal cream Commonly known as:  PREMARIN Place 1 Applicatorful vaginally daily.   diclofenac 75 MG EC tablet Commonly known as:  VOLTAREN Take 1 tablet (75 mg total) by mouth every 12 (twelve) hours as needed.   DULoxetine 60 MG capsule Commonly known as:  CYMBALTA Take 1 capsule (60 mg total) by mouth daily. Daily   EQ ALLERGY RELIEF 10 MG tablet Generic drug:  loratadine TAKE ONE TABLET BY MOUTH ONCE DAILY   fluticasone 50 MCG/ACT nasal spray Commonly known as:  FLONASE by Nasal route.   levothyroxine 88 MCG tablet Commonly known as:  SYNTHROID, LEVOTHROID Take 1 tablet (88 mcg total) by mouth daily before breakfast.   LINZESS 145 MCG Caps capsule Generic drug:  linaclotide TAKE ONE CAPSULE BY MOUTH ONCE DAILY BEFORE BREAKFAST   metoprolol succinate 25 MG 24 hr tablet Commonly known  as:  TOPROL XL Take 1 tablet (25 mg total) by mouth daily.   montelukast 10 MG tablet Commonly known as:  SINGULAIR Take 1 tablet (10 mg total) by mouth at bedtime.   MYRBETRIQ 25 MG Tb24 tablet Generic drug:  mirabegron ER Take 25 mg by mouth daily.   nitroGLYCERIN 0.4 MG SL tablet Commonly known as:  NITROSTAT Place 1 tablet (0.4 mg total) under the tongue every 5 (five) minutes as needed.   omeprazole 40 MG capsule Commonly known as:  PRILOSEC Take 1 capsule (40 mg total) by mouth  daily.   QUEtiapine 25 MG tablet Commonly known as:  SEROQUEL Take 1 tablet (25 mg total) by mouth at bedtime.   rizatriptan 10 MG tablet Commonly known as:  MAXALT Take 1 tablet (10 mg total) by mouth 3 (three) times daily as needed for migraine.   topiramate 25 MG tablet Commonly known as:  TOPAMAX Take one tablet at night for one week, then take 2 tablets at night for one week, then take 3 tablets at night.   valsartan-hydrochlorothiazide 320-12.5 MG tablet Commonly known as:  DIOVAN-HCT Take 1 tablet by mouth daily.       Allergies:  Allergies  Allergen Reactions  . Latex     Pt unsure of reaction pt states something happened while in the hospital after surgery.    Family History: Family History  Problem Relation Age of Onset  . Diabetes Sister   . Hypertension Sister   . Cirrhosis Mother   . Cirrhosis Father   . Breast cancer Sister 78  . Heart attack Brother   . Prostate cancer Neg Hx   . Kidney cancer Neg Hx   . Bladder Cancer Neg Hx     Social History:  reports that she has never smoked. She has never used smokeless tobacco. She reports that she does not drink alcohol or use drugs.  ROS: UROLOGY Frequent Urination?: No Hard to postpone urination?: No Burning/pain with urination?: No Get up at night to urinate?: Yes Leakage of urine?: Yes Urine stream starts and stops?: No Trouble starting stream?: No Do you have to strain to urinate?: No Blood in urine?:  No Urinary tract infection?: No Sexually transmitted disease?: No Injury to kidneys or bladder?: No Painful intercourse?: No Weak stream?: No Currently pregnant?: No Vaginal bleeding?: No Last menstrual period?: n  Gastrointestinal Nausea?: Yes Vomiting?: No Indigestion/heartburn?: Yes Diarrhea?: No Constipation?: No  Constitutional Fever: No Night sweats?: Yes Weight loss?: No Fatigue?: No  Skin Skin rash/lesions?: No Itching?: Yes  Eyes Blurred vision?: Yes Double vision?: No  Ears/Nose/Throat Sore throat?: No Sinus problems?: Yes  Hematologic/Lymphatic Swollen glands?: No Easy bruising?: No  Cardiovascular Leg swelling?: No Chest pain?: No  Respiratory Cough?: Yes Shortness of breath?: Yes  Endocrine Excessive thirst?: Yes  Musculoskeletal Back pain?: Yes Joint pain?: Yes  Neurological Headaches?: Yes Dizziness?: No  Psychologic Depression?: Yes Anxiety?: Yes  Physical Exam: BP 116/74   Pulse 71   Ht 5\' 5"  (1.651 m)   Wt 246 lb 6.4 oz (111.8 kg)   BMI 41.00 kg/m   Constitutional: Well nourished. Alert and oriented, No acute distress. HEENT: [place] AT, moist mucus membranes. Trachea midline, no masses. Cardiovascular: No clubbing, cyanosis, or edema. Respiratory: Normal respiratory effort, no increased work of breathing. Skin: No rashes, bruises or suspicious lesions. Lymph: No cervical or inguinal adenopathy. Neurologic: Grossly intact, no focal deficits, moving all 4 extremities. Psychiatric: Normal mood and affect.  Laboratory Data: Lab Results  Component Value Date   WBC 9.6 07/09/2016   HGB 11.1 (L) 07/09/2016   HCT 33.3 (L) 07/09/2016   MCV 81.4 07/09/2016   PLT 302 07/09/2016    Lab Results  Component Value Date   CREATININE 0.74 07/09/2016     Lab Results  Component Value Date   HGBA1C 6.2 (H) 07/09/2016    Lab Results  Component Value Date   TSH 4.20 11/12/2016       Component Value Date/Time   CHOL 155  07/09/2016 1120   CHOL 192 12/20/2015 1012  HDL 37 (L) 07/09/2016 1120   HDL 46 12/20/2015 1012   CHOLHDL 4.2 07/09/2016 1120   VLDL 62 (H) 07/09/2016 1120   LDLCALC 56 07/09/2016 1120   LDLCALC 103 (H) 12/20/2015 1012    Lab Results  Component Value Date   AST 15 07/09/2016   Lab Results  Component Value Date   ALT 11 07/09/2016     Pertinent Imaging: Results for TYFFANY, WALDROP (MRN [id number]) as of 11/14/2016 10:55  Ref. Range 11/14/2016 10:34  Scan Result Unknown 0     Assessment & Plan:    1. Mixed urinary Incontinence  - Patient is having good response with Myrbetriq 25 mg daily  - He will continue the medication and prescription is sent to her pharmacy  - Return to clinic in 3 months for OAB questionnaire and PVR  2. Suprapubic pain  - PCP treating for GI issues and is finding relief                                 3. Vaginal atrophy  - Continue Premarin cream 3 nights weekly  - A prescription is sent to her pharmacy  - She'll return to clinic in 3 months for exam  Return in about 3 months (around 02/13/2017) for OAB questionnaire, PVR and exam.  These notes generated with voice recognition software. I apologize for typographical errors.  Zara Council, [facility] [address], [place], [place] [address] [phone]

## 2016-11-14 ENCOUNTER — Ambulatory Visit: Payer: BLUE CROSS/BLUE SHIELD | Admitting: Urology

## 2016-11-14 ENCOUNTER — Encounter: Payer: Self-pay | Admitting: Urology

## 2016-11-14 VITALS — BP 116/74 | HR 71 | Ht 65.0 in | Wt 246.4 lb

## 2016-11-14 DIAGNOSIS — N952 Postmenopausal atrophic vaginitis: Secondary | ICD-10-CM | POA: Diagnosis not present

## 2016-11-14 DIAGNOSIS — R102 Pelvic and perineal pain: Secondary | ICD-10-CM | POA: Diagnosis not present

## 2016-11-14 DIAGNOSIS — N3946 Mixed incontinence: Secondary | ICD-10-CM | POA: Diagnosis not present

## 2016-11-14 LAB — BLADDER SCAN AMB NON-IMAGING: SCAN RESULT: 0

## 2016-11-14 MED ORDER — MIRABEGRON ER 25 MG PO TB24: 25.0000 mg | ORAL_TABLET | Freq: Every day | ORAL | 11 refills | Status: DC

## 2016-11-14 MED ORDER — ESTROGENS, CONJUGATED 0.625 MG/GM VA CREA: 1.0000 | TOPICAL_CREAM | Freq: Every day | VAGINAL | 12 refills | Status: DC

## 2016-11-15 ENCOUNTER — Telehealth: Payer: Self-pay | Admitting: Urology

## 2016-11-15 DIAGNOSIS — N952 Postmenopausal atrophic vaginitis: Secondary | ICD-10-CM

## 2016-11-15 DIAGNOSIS — N3946 Mixed incontinence: Secondary | ICD-10-CM

## 2016-11-15 MED ORDER — MIRABEGRON ER 25 MG PO TB24: 25.0000 mg | ORAL_TABLET | Freq: Every day | ORAL | 11 refills | Status: DC

## 2016-11-15 MED ORDER — ESTROGENS, CONJUGATED 0.625 MG/GM VA CREA: 1.0000 | TOPICAL_CREAM | Freq: Every day | VAGINAL | 12 refills | Status: DC

## 2016-11-15 NOTE — Telephone Encounter (Signed)
Pt called left message on VM stating that she wanted her medications called into the [facility] on [address]. Please advise.

## 2016-11-15 NOTE — Telephone Encounter (Signed)
Medication sent to [facility] on [address].

## 2016-11-26 ENCOUNTER — Telehealth: Payer: Self-pay

## 2016-11-26 NOTE — Telephone Encounter (Signed)
PA for myrbetriq has been DENIED!

## 2016-12-06 ENCOUNTER — Other Ambulatory Visit: Payer: Self-pay | Admitting: Family Medicine

## 2016-12-06 DIAGNOSIS — E034 Atrophy of thyroid (acquired): Secondary | ICD-10-CM

## 2016-12-07 ENCOUNTER — Other Ambulatory Visit: Payer: Self-pay | Admitting: Family Medicine

## 2016-12-07 DIAGNOSIS — E785 Hyperlipidemia, unspecified: Secondary | ICD-10-CM

## 2016-12-09 ENCOUNTER — Other Ambulatory Visit: Payer: Self-pay

## 2016-12-09 DIAGNOSIS — F411 Generalized anxiety disorder: Secondary | ICD-10-CM

## 2016-12-09 DIAGNOSIS — I209 Angina pectoris, unspecified: Secondary | ICD-10-CM

## 2016-12-09 NOTE — Telephone Encounter (Signed)
Patient requesting refill of Metoprolol to [facility].

## 2016-12-10 ENCOUNTER — Other Ambulatory Visit: Payer: Self-pay | Admitting: Family Medicine

## 2016-12-10 DIAGNOSIS — E034 Atrophy of thyroid (acquired): Secondary | ICD-10-CM

## 2016-12-10 MED ORDER — METOPROLOL SUCCINATE ER 25 MG PO TB24: 25.0000 mg | ORAL_TABLET | Freq: Every day | ORAL | 1 refills | Status: DC

## 2016-12-10 NOTE — Telephone Encounter (Signed)
Patient requesting refill of Levothyroxine to [facility].

## 2016-12-18 ENCOUNTER — Ambulatory Visit (INDEPENDENT_AMBULATORY_CARE_PROVIDER_SITE_OTHER): Payer: BLUE CROSS/BLUE SHIELD | Admitting: Adult Health

## 2016-12-18 ENCOUNTER — Encounter: Payer: Self-pay | Admitting: Adult Health

## 2016-12-18 VITALS — BP 129/84 | HR 61 | Ht 65.0 in | Wt 246.6 lb

## 2016-12-18 DIAGNOSIS — G43719 Chronic migraine without aura, intractable, without status migrainosus: Secondary | ICD-10-CM

## 2016-12-18 MED ORDER — TOPIRAMATE 25 MG PO TABS: 100.0000 mg | ORAL_TABLET | Freq: Every day | ORAL | 5 refills | Status: DC

## 2016-12-18 NOTE — Patient Instructions (Signed)
Increase Topamax 100 mg at bedtime If this offers you no benefit we will consider nortriptyline Nortriptyline and Cymbalta can cause serotonin syndrome when taken together. Therefore we will slowly wean you off Cymbalta if nortriptyline works for headaches.   Nortriptyline capsules What is this medicine? NORTRIPTYLINE (nor TRIP ti leen) is used to treat depression. This medicine may be used for other purposes; ask your health care provider or pharmacist if you have questions. COMMON BRAND NAME(S): Aventyl, Pamelor What should I tell my health care provider before I take this medicine? They need to know if you have any of these conditions: -an alcohol problem -bipolar disorder or schizophrenia -difficulty passing urine, prostate trouble -glaucoma -heart disease or recent heart attack -liver disease -over active thyroid -seizures -thoughts or plans of suicide or a previous suicide attempt or family history of suicide attempt -an unusual or allergic reaction to nortriptyline, other medicines, foods, dyes, or preservatives -pregnant or trying to get pregnant -breast-feeding How should I use this medicine? Take this medicine by mouth with a glass of water. Follow the directions on the prescription label. Take your doses at regular intervals. Do not take it more often than directed. Do not stop taking this medicine suddenly except upon the advice of your doctor. Stopping this medicine too quickly may cause serious side effects or your condition may worsen. A special MedGuide will be given to you by the pharmacist with each prescription and refill. Be sure to read this information carefully each time. Talk to your pediatrician regarding the use of this medicine in children. Special care may be needed. Overdosage: If you think you have taken too much of this medicine contact a poison control center or emergency room at once. NOTE: This medicine is only for you. Do not share this medicine with  others. What if I miss a dose? If you miss a dose, take it as soon as you can. If it is almost time for your next dose, take only that dose. Do not take double or extra doses. What may interact with this medicine? Do not take this medicine with any of the following medications: -arsenic trioxide -certain medicines medicines for irregular heart beat -cisapride -halofantrine -linezolid -MAOIs like Carbex, Eldepryl, Marplan, Nardil, and Parnate -methylene blue (injected into a vein) -other medicines for mental depression -phenothiazines like perphenazine, thioridazine and chlorpromazine -pimozide -probucol -procarbazine -sparfloxacin -St. John's Wort -ziprasidone This medicine may also interact with any of the following medications: -atropine and related drugs like hyoscyamine, scopolamine, tolterodine and others -barbiturate medicines for inducing sleep or treating seizures, such as phenobarbital -cimetidine -medicines for diabetes -medicines for seizures like carbamazepine or phenytoin -reserpine -thyroid medicine This list may not describe all possible interactions. Give your health care provider a list of all the medicines, herbs, non-prescription drugs, or dietary supplements you use. Also tell them if you smoke, drink alcohol, or use illegal drugs. Some items may interact with your medicine. What should I watch for while using this medicine? Tell your doctor if your symptoms do not get better or if they get worse. Visit your doctor or health care professional for regular checks on your progress. Because it may take several weeks to see the full effects of this medicine, it is important to continue your treatment as prescribed by your doctor. Patients and their families should watch out for new or worsening thoughts of suicide or depression. Also watch out for sudden changes in feelings such as feeling anxious, agitated, panicky, irritable, hostile, aggressive, impulsive,  severely  restless, overly excited and hyperactive, or not being able to sleep. If this happens, especially at the beginning of treatment or after a change in dose, call your health care professional. Dennis Bast may get drowsy or dizzy. Do not drive, use machinery, or do anything that needs mental alertness until you know how this medicine affects you. Do not stand or sit up quickly, especially if you are an older patient. This reduces the risk of dizzy or fainting spells. Alcohol may interfere with the effect of this medicine. Avoid alcoholic drinks. Do not treat yourself for coughs, colds, or allergies without asking your doctor or health care professional for advice. Some ingredients can increase possible side effects. Your mouth may get dry. Chewing sugarless gum or sucking hard candy, and drinking plenty of water may help. Contact your doctor if the problem does not go away or is severe. This medicine may cause dry eyes and blurred vision. If you wear contact lenses you may feel some discomfort. Lubricating drops may help. See your eye doctor if the problem does not go away or is severe. This medicine can cause constipation. Try to have a bowel movement at least every 2 to 3 days. If you do not have a bowel movement for 3 days, call your doctor or health care professional. This medicine can make you more sensitive to the sun. Keep out of the sun. If you cannot avoid being in the sun, wear protective clothing and use sunscreen. Do not use sun lamps or tanning beds/booths. What side effects may I notice from receiving this medicine? Side effects that you should report to your doctor or health care professional as soon as possible: -allergic reactions like skin rash, itching or hives, swelling of the face, lips, or tongue -anxious -breathing problems -changes in vision -confusion -elevated mood, decreased need for sleep, racing thoughts, impulsive behavior -eye pain -fast, irregular heartbeat -feeling faint or  lightheaded, falls -feeling agitated, angry, or irritable -fever with increased sweating -hallucination, loss of contact with reality -seizures -stiff muscles -suicidal thoughts or other mood changes -tingling, pain, or numbness in the feet or hands -trouble passing urine or change in the amount of urine -trouble sleeping -unusually weak or tired -vomiting -yellowing of the eyes or skin Side effects that usually do not require medical attention (report to your doctor or health care professional if they continue or are bothersome): -change in sex drive or performance -change in appetite or weight -constipation -dizziness -dry mouth -nausea -tired -tremors -upset stomach This list may not describe all possible side effects. Call your doctor for medical advice about side effects. You may report side effects to FDA at 1-800-FDA-1088. Where should I keep my medicine? Keep out of the reach of children. Store at room temperature between 15 and 30 degrees C (59 and 86 degrees F). Keep container tightly closed. Throw away any unused medicine after the expiration date. NOTE: This sheet is a summary. It may not cover all possible information. If you have questions about this medicine, talk to your doctor, pharmacist, or health care provider.  2018 Elsevier/Gold Standard (2015-12-15 12:53:08)

## 2016-12-18 NOTE — Progress Notes (Signed)
I have read the note, and I agree with the clinical assessment and plan.  Beverly Barrett,Beverly Barrett   

## 2016-12-18 NOTE — Progress Notes (Signed)
PATIENT: Beverly Barrett DOB: Feb 04, 1961  REASON FOR VISIT: follow up- migraine headaches HISTORY FROM: patient  HISTORY OF PRESENT ILLNESS: Beverly Barrett is a 56 year old female with a history of migraine headaches. She returns today for follow-up. She was started on Topamax 75 mg at bedtime. She states that she does not feel that it's offered her much benefit. She continues to have frequent headaches. The headaches typically occur on the left side and in the back of the head. She does have photophobia and phonophobia as well as nausea with her headaches. She states that she can take the first dose of Maxalt and the headache may ease off slightly but typically comes back and she takes a second dose. The headache may resolve in an hour. Patient denies any additional neurologic symptoms. She is getting cortisone injections in the shoulders and back. She returns today for follow-up.  HISTORY 09/20/16: Beverly Barrett is a 56 year old right-handed black female with a history of migraine headaches off and on throughout her life. The patient usually has headaches in the frontotemporal areas bilaterally, these headaches may be associated with some blurred vision, nausea, photophobia and phonophobia. The patient will have 2 or 3 such headaches a week, the headaches may last up to about 2 hours. The patient takes [facility] powders for the headache with some benefit. The patient has noted that certain odors such as perfumes, missing a meal, heat exposure may bring on the headache. The patient has developed a new type of headache over the last 2 or 3 months. She indicates that she will get discomfort in the back of the neck and behind the ears, she reports a throbbing discomfort, she will also have nausea with these headaches, photophobia and phonophobia, and blurring of vision. The patient may also have some dizziness. The patient uses about 6 Goody powders a week on average, she drinks coffee 2 or 3 times a week, and Pepsi  one or 2 times a week. The patient drinks tea on a daily basis. She has a strong family history of migraine in 2 daughters, both parents, and 2 sisters all have migraine headache. The patient is sent to this office for an evaluation. The patient has had several CT head scan evaluations since 2006, the last one was in 2012, this was unremarkable. Currently, the patient does report some neck stiffness with these headaches.   REVIEW OF SYSTEMS: Out of a complete 14 system review of symptoms, the patient complains only of the following symptoms, and all other reviewed systems are negative.  Incontinence of bladder, headache, joint pain, back pain, aching muscles, walking difficulty, neck pain, depression, apnea, sleep talking, acting out dreams, leg swelling, cough, wheezing, shortness of breath, chest tightness, ear pain  ALLERGIES: Allergies  Allergen Reactions  . Latex     Pt unsure of reaction pt states something happened while in the hospital after surgery.    HOME MEDICATIONS: Outpatient Medications Prior to Visit  Medication Sig Dispense Refill  . ALPRAZolam (XANAX XR) 0.5 MG 24 hr tablet Take 1 tablet (0.5 mg total) by mouth daily. 30 tablet 2  . Amlodipine-Valsartan-HCTZ 5-160-25 MG TABS TAKE 1 TABLET BY MOUTH DAILY 90 tablet 1  . aspirin 81 MG EC tablet Take 81 mg by mouth daily.      Marland Kitchen atorvastatin (LIPITOR) 40 MG tablet TAKE 1 TABLET BY MOUTH AT BEDTIME. GENERIC EQUIVALENT FOR LIPITOR 90 tablet 1  . Azelastine HCl 0.15 % SOLN Place 2 sprays into both nostrils 2 (  two) times daily.   3  . conjugated estrogens (PREMARIN) vaginal cream Place 1 Applicatorful vaginally daily. Apply 0.5mg  (pea-sized amount)  just inside the vaginal introitus with a finger-tip every night for two weeks and then Monday, Wednesday and Friday nights. 30 g 12  . diclofenac (VOLTAREN) 75 MG EC tablet Take 1 tablet (75 mg total) by mouth every 12 (twelve) hours as needed. 180 tablet 0  . DULoxetine (CYMBALTA) 60  MG capsule Take 1 capsule (60 mg total) by mouth daily. Daily 90 capsule 1  . EQ ALLERGY RELIEF 10 MG tablet TAKE ONE TABLET BY MOUTH ONCE DAILY 90 tablet 1  . fluticasone (FLONASE) 50 MCG/ACT nasal spray by Nasal route.    Marland Kitchen levothyroxine (SYNTHROID, LEVOTHROID) 88 MCG tablet TAKE 1 TABLET BY MOUTH DAILY BEFORE BREAKFAST.STOP LEVOTHYROXINE 100 MCG. SANDOZ GENERIC ONLY 90 tablet 0  . LINZESS 145 MCG CAPS capsule TAKE ONE CAPSULE BY MOUTH ONCE DAILY BEFORE BREAKFAST 30 capsule 2  . metoprolol succinate (TOPROL XL) 25 MG 24 hr tablet Take 1 tablet (25 mg total) by mouth daily. 90 tablet 1  . mirabegron ER (MYRBETRIQ) 25 MG TB24 tablet Take 1 tablet (25 mg total) by mouth daily. 30 tablet 11  . montelukast (SINGULAIR) 10 MG tablet Take 1 tablet (10 mg total) by mouth at bedtime. 90 tablet 1  . nitroGLYCERIN (NITROSTAT) 0.4 MG SL tablet Place 1 tablet (0.4 mg total) under the tongue every 5 (five) minutes as needed. 25 tablet 6  . omeprazole (PRILOSEC) 40 MG capsule Take 1 capsule (40 mg total) by mouth daily. 90 capsule 1  . QUEtiapine (SEROQUEL) 25 MG tablet Take 1 tablet (25 mg total) by mouth at bedtime. 90 tablet 1  . rizatriptan (MAXALT) 10 MG tablet Take 1 tablet (10 mg total) by mouth 3 (three) times daily as needed for migraine. 10 tablet 3  . topiramate (TOPAMAX) 25 MG tablet Take one tablet at night for one week, then take 2 tablets at night for one week, then take 3 tablets at night. 90 tablet 3  . valsartan-hydrochlorothiazide (DIOVAN-HCT) 320-12.5 MG tablet Take 1 tablet by mouth daily. 90 tablet 1   No facility-administered medications prior to visit.     PAST MEDICAL HISTORY: Past Medical History:  Diagnosis Date  . Anginal pain ([place])    none in approx 2 yrs  . Anxiety   . Asthma   . Chronic lower back pain   . Common migraine with intractable migraine 09/20/2016  . Depression    suicidal ideation  . GERD (gastroesophageal reflux disease)   . Headache    migraines -2x/month    . Hyperlipidemia   . Hypertension   . Hypothyroidism   . Motion sickness    cars  . Shortness of breath dyspnea   . Sleep apnea    No machine  . Vertigo   . Wears dentures    partial upper    PAST SURGICAL HISTORY: Past Surgical History:  Procedure Laterality Date  . ABDOMINAL HYSTERECTOMY    . bladder tach  1995  . CESAREAN SECTION    . COLONOSCOPY WITH PROPOFOL N/A 05/29/2015   Procedure: COLONOSCOPY WITH PROPOFOL;  Surgeon: Lucilla Lame, MD;  Location: [facility];  Service: Endoscopy;  Laterality: N/A;  Latex allergy sleep apnea - no CPAP machine (yet)  . POLYPECTOMY  05/29/2015   Procedure: POLYPECTOMY;  Surgeon: Lucilla Lame, MD;  Location: [facility];  Service: Endoscopy;;    FAMILY HISTORY: Family  History  Problem Relation Age of Onset  . Diabetes Sister   . Hypertension Sister   . Cirrhosis Mother   . Cirrhosis Father   . Breast cancer Sister 8  . Heart attack Brother   . Prostate cancer Neg Hx   . Kidney cancer Neg Hx   . Bladder Cancer Neg Hx     SOCIAL HISTORY: Social History   Social History  . Marital status: Married    Spouse name: N/A  . Number of children: N/A  . Years of education: N/A   Occupational History  . Not on file.   Social History Main Topics  . Smoking status: Never Smoker  . Smokeless tobacco: Never Used  . Alcohol use No  . Drug use: No  . Sexual activity: Yes    Partners: Male   Other Topics Concern  . Not on file   Social History Narrative   Lives   Caffeine use:       PHYSICAL EXAM  Vitals:   12/18/16 0859  BP: 129/84  Pulse: 61  Weight: 246 lb 9.6 oz (111.9 kg)  Height: 5\' 5"  (1.651 m)   Body mass index is 41.04 kg/m.  Generalized: Well developed, in no acute distress   Neurological examination  Mentation: Alert oriented to time, place, history taking. Follows all commands speech and language fluent Cranial nerve II-XII: Pupils were equal round reactive to light. Extraocular  movements were full, visual field were full on confrontational test. Facial sensation and strength were normal. Uvula tongue midline. Head turning and shoulder shrug  were normal and symmetric. Motor: The motor testing reveals 5 over 5 strength of all 4 extremities. Good symmetric motor tone is noted throughout.  Sensory: Sensory testing is intact to soft touch on all 4 extremities. No evidence of extinction is noted.  Coordination: Cerebellar testing reveals good finger-nose-finger and heel-to-shin bilaterally.  Gait and station: Gait is normal. Tandem gait is Slightly unsteady.. Romberg is negative. No drift is seen.  Reflexes: Deep tendon reflexes are symmetric and normal bilaterally.   DIAGNOSTIC DATA (LABS, IMAGING, TESTING) - I reviewed patient records, labs, notes, testing and imaging myself where available.  Lab Results  Component Value Date   WBC 9.6 07/09/2016   HGB 11.1 (L) 07/09/2016   HCT 33.3 (L) 07/09/2016   MCV 81.4 07/09/2016   PLT 302 07/09/2016      Component Value Date/Time   NA 142 07/09/2016 1120   NA 144 12/20/2015 1012   K 3.3 (L) 07/09/2016 1120   CL 101 07/09/2016 1120   CO2 28 07/09/2016 1120   GLUCOSE 111 (H) 07/09/2016 1120   BUN 16 07/09/2016 1120   BUN 17 12/20/2015 1012   CREATININE 0.74 07/09/2016 1120   CALCIUM 9.0 07/09/2016 1120   PROT 6.9 07/09/2016 1120   PROT 7.2 12/20/2015 1012   ALBUMIN 4.2 07/09/2016 1120   ALBUMIN 4.3 12/20/2015 1012   AST 15 07/09/2016 1120   ALT 11 07/09/2016 1120   ALKPHOS 63 07/09/2016 1120   BILITOT 0.4 07/09/2016 1120   BILITOT 0.2 12/20/2015 1012   GFRNONAA >89 07/09/2016 1120   GFRAA >89 07/09/2016 1120   Lab Results  Component Value Date   CHOL 155 07/09/2016   HDL 37 (L) 07/09/2016   LDLCALC 56 07/09/2016   TRIG 308 (H) 07/09/2016   CHOLHDL 4.2 07/09/2016   Lab Results  Component Value Date   HGBA1C 6.2 (H) 07/09/2016   No results found for: [id number] Lab Results  Component Value Date   TSH  4.20 11/12/2016      ASSESSMENT AND PLAN 56 y.o. year old female  has a past medical history of Anginal pain ([place]); Anxiety; Asthma; Chronic lower back pain; Common migraine with intractable migraine (09/20/2016); Depression; GERD (gastroesophageal reflux disease); Headache; Hyperlipidemia; Hypertension; Hypothyroidism; Motion sickness; Shortness of breath dyspnea; Sleep apnea; Vertigo; and Wears dentures. here with:  1. Migraine headache  The patient will increase Topamax 100 mg at bedtime to see if this offers her any benefit with her headaches. I advised patient that she should begin keeping a headache journal. If Topamax does not decrease the frequency or severity of her headaches we will consider nortriptyline. I did explain to the patient that taking nortriptyline and Cymbalta together can increase her risk for serotonin syndrome. Advised that if nortriptyline is helping with her migraines we will slowly to wean her off of Cymbalta. She voiced understanding. She will call if her headaches do not improve with Topamax. She will follow-up in 3 months or sooner if needed.  I spent 15 minutes with the patient 50% of this time was spent discussing medication options.   Ward Givens, MSN, NP-C 12/18/2016, 9:24 AM [facility] [address], [place], [place] [address] [phone]

## 2016-12-24 ENCOUNTER — Other Ambulatory Visit: Payer: Self-pay | Admitting: Neurology

## 2016-12-28 ENCOUNTER — Other Ambulatory Visit: Payer: Self-pay | Admitting: Family Medicine

## 2016-12-28 DIAGNOSIS — G8929 Other chronic pain: Secondary | ICD-10-CM

## 2016-12-28 DIAGNOSIS — M2550 Pain in unspecified joint: Principal | ICD-10-CM

## 2016-12-30 ENCOUNTER — Encounter: Payer: Self-pay | Admitting: Family Medicine

## 2017-01-03 ENCOUNTER — Telehealth: Payer: Self-pay | Admitting: Urology

## 2017-01-03 NOTE — Telephone Encounter (Signed)
Patient states that she never got her script for her mybretiq? I see that it was sent to her pharmacy in April. Can we check and see what happened?  Sharyn Lull

## 2017-01-03 NOTE — Telephone Encounter (Signed)
Spoke to pharmacist at [facility]. Myrbetriq requires PA. Pharmacist sent PA to our office. Explnd turn around time to patient. Patient verbalized understanding

## 2017-01-09 NOTE — Telephone Encounter (Signed)
Received PA request for Myrbetriq. Med was denied 10/2016.

## 2017-01-13 ENCOUNTER — Other Ambulatory Visit: Payer: Self-pay | Admitting: Family Medicine

## 2017-01-13 DIAGNOSIS — G8929 Other chronic pain: Secondary | ICD-10-CM

## 2017-01-13 DIAGNOSIS — F331 Major depressive disorder, recurrent, moderate: Secondary | ICD-10-CM

## 2017-01-13 DIAGNOSIS — F411 Generalized anxiety disorder: Secondary | ICD-10-CM

## 2017-01-13 DIAGNOSIS — J4520 Mild intermittent asthma, uncomplicated: Secondary | ICD-10-CM

## 2017-01-13 DIAGNOSIS — M2550 Pain in unspecified joint: Secondary | ICD-10-CM

## 2017-01-13 NOTE — Telephone Encounter (Signed)
Patient requesting refill of Cymbalta and Singulair to [facility].

## 2017-01-22 ENCOUNTER — Other Ambulatory Visit: Payer: Self-pay | Admitting: Family Medicine

## 2017-01-22 DIAGNOSIS — K5909 Other constipation: Secondary | ICD-10-CM

## 2017-01-22 NOTE — Telephone Encounter (Signed)
Patient requesting refill of Linzess to [facility].

## 2017-02-06 ENCOUNTER — Telehealth: Payer: Self-pay | Admitting: Adult Health

## 2017-02-06 MED ORDER — NORTRIPTYLINE HCL 10 MG PO CAPS: 10.0000 mg | ORAL_CAPSULE | Freq: Every day | ORAL | 3 refills | Status: DC

## 2017-02-06 NOTE — Addendum Note (Signed)
Addended by: Trudie Buckler on: 02/06/2017 05:29 PM   Modules accepted: Orders

## 2017-02-06 NOTE — Telephone Encounter (Addendum)
Start nortriptyline 10 mg at bedtime. I advised the patient on office visit that nortriptyline with Cymbalta can increase her risk for serotonin syndrome. If nortriptyline is beneficial we will slowly wean her off of Cymbalta. She can reduce topamax 75 mg at bedtime. We will continue to slowly wean.   Patient is on seroquel 25 mg at bedtime. I advised that Seroquel and nortriptyline can cause QT prolongation. I advised that nortriptyline can also help with her sleep. If my recommendation that she stopped Seroquel and try nortriptyline. She will discuss this with her primary care provider. She will not start nortriptyline until she stops Seroquel.

## 2017-02-06 NOTE — Telephone Encounter (Signed)
Patient taking topiramate (TOPAMAX) 25 MG tablet and rizatriptan (MAXALT) 10 MG tablet for migraines but medication is not helping.Can another medication be tried?  Patient uses [facility] on [address] in [place].

## 2017-02-10 ENCOUNTER — Other Ambulatory Visit: Payer: Self-pay | Admitting: Family Medicine

## 2017-02-10 DIAGNOSIS — F411 Generalized anxiety disorder: Secondary | ICD-10-CM

## 2017-02-12 ENCOUNTER — Ambulatory Visit (INDEPENDENT_AMBULATORY_CARE_PROVIDER_SITE_OTHER): Payer: BLUE CROSS/BLUE SHIELD | Admitting: Family Medicine

## 2017-02-12 ENCOUNTER — Encounter: Payer: Self-pay | Admitting: Family Medicine

## 2017-02-12 VITALS — BP 136/74 | HR 82 | Temp 98.3°F | Resp 16 | Ht 65.0 in | Wt 241.9 lb

## 2017-02-12 DIAGNOSIS — N3946 Mixed incontinence: Secondary | ICD-10-CM

## 2017-02-12 DIAGNOSIS — I10 Essential (primary) hypertension: Secondary | ICD-10-CM | POA: Diagnosis not present

## 2017-02-12 DIAGNOSIS — G43019 Migraine without aura, intractable, without status migrainosus: Secondary | ICD-10-CM

## 2017-02-12 DIAGNOSIS — F331 Major depressive disorder, recurrent, moderate: Secondary | ICD-10-CM

## 2017-02-12 DIAGNOSIS — E034 Atrophy of thyroid (acquired): Secondary | ICD-10-CM

## 2017-02-12 DIAGNOSIS — E785 Hyperlipidemia, unspecified: Secondary | ICD-10-CM

## 2017-02-12 DIAGNOSIS — F411 Generalized anxiety disorder: Secondary | ICD-10-CM

## 2017-02-12 DIAGNOSIS — M2550 Pain in unspecified joint: Secondary | ICD-10-CM

## 2017-02-12 DIAGNOSIS — I209 Angina pectoris, unspecified: Secondary | ICD-10-CM | POA: Diagnosis not present

## 2017-02-12 DIAGNOSIS — J4520 Mild intermittent asthma, uncomplicated: Secondary | ICD-10-CM

## 2017-02-12 DIAGNOSIS — K5909 Other constipation: Secondary | ICD-10-CM

## 2017-02-12 DIAGNOSIS — G4700 Insomnia, unspecified: Secondary | ICD-10-CM | POA: Diagnosis not present

## 2017-02-12 DIAGNOSIS — R739 Hyperglycemia, unspecified: Secondary | ICD-10-CM

## 2017-02-12 DIAGNOSIS — K219 Gastro-esophageal reflux disease without esophagitis: Secondary | ICD-10-CM

## 2017-02-12 DIAGNOSIS — G8929 Other chronic pain: Secondary | ICD-10-CM

## 2017-02-12 DIAGNOSIS — D649 Anemia, unspecified: Secondary | ICD-10-CM

## 2017-02-12 DIAGNOSIS — M545 Low back pain: Secondary | ICD-10-CM

## 2017-02-12 LAB — CBC WITH DIFFERENTIAL/PLATELET
BASOS PCT: 1 %
Basophils Absolute: 111 cells/uL (ref 0–200)
Eosinophils Absolute: 111 cells/uL (ref 15–500)
Eosinophils Relative: 1 %
HCT: 37.7 % (ref 35.0–45.0)
Hemoglobin: 12.4 g/dL (ref 11.7–15.5)
LYMPHS PCT: 22 %
Lymphs Abs: 2442 cells/uL (ref 850–3900)
MCH: 27.7 pg (ref 27.0–33.0)
MCHC: 32.9 g/dL (ref 32.0–36.0)
MCV: 84.3 fL (ref 80.0–100.0)
MONOS PCT: 8 %
MPV: 9.3 fL (ref 7.5–12.5)
Monocytes Absolute: 888 cells/uL (ref 200–950)
NEUTROS PCT: 68 %
Neutro Abs: 7548 cells/uL (ref 1500–7800)
PLATELETS: 253 10*3/uL (ref 140–400)
RBC: 4.47 MIL/uL (ref 3.80–5.10)
RDW: 17.7 % — AB (ref 11.0–15.0)
WBC: 11.1 10*3/uL — AB (ref 3.8–10.8)

## 2017-02-12 MED ORDER — LINACLOTIDE 145 MCG PO CAPS: ORAL_CAPSULE | ORAL | 1 refills | Status: DC

## 2017-02-12 MED ORDER — MONTELUKAST SODIUM 10 MG PO TABS: 10.0000 mg | ORAL_TABLET | Freq: Every day | ORAL | 1 refills | Status: DC

## 2017-02-12 MED ORDER — OLMESARTAN MEDOXOMIL-HCTZ 40-12.5 MG PO TABS: 1.0000 | ORAL_TABLET | Freq: Every day | ORAL | 1 refills | Status: DC

## 2017-02-12 MED ORDER — ALPRAZOLAM ER 0.5 MG PO TB24: 0.5000 mg | ORAL_TABLET | Freq: Every day | ORAL | 2 refills | Status: DC

## 2017-02-12 MED ORDER — DULOXETINE HCL 60 MG PO CPEP: 60.0000 mg | ORAL_CAPSULE | Freq: Every day | ORAL | 1 refills | Status: DC

## 2017-02-12 MED ORDER — OMEPRAZOLE 40 MG PO CPDR: 40.0000 mg | DELAYED_RELEASE_CAPSULE | Freq: Every day | ORAL | 1 refills | Status: DC

## 2017-02-12 MED ORDER — QUETIAPINE FUMARATE 25 MG PO TABS: 25.0000 mg | ORAL_TABLET | Freq: Every day | ORAL | 1 refills | Status: DC

## 2017-02-12 NOTE — Progress Notes (Signed)
02/13/2017 10:57 AM   Beverly Barrett 07/27/1961 [id number]  Referring provider: Steele Sizer, MD [address] [place], [place] [address]  Chief Complaint  Patient presents with  . Urinary Incontinence    Mixed  3 month follow up  . Vaginal Atrophy    HPI: 56 yo AAF who presents today for a 3 month follow up after starting Myrbetriq for mixed urinary incontinence and Premarin for vaginal atrophy.  Background history Patient is a 83 -year-old Beverly Barrett female who is referred to [place] by, Dr. Ancil Barrett, for urinary incontinence and recurrent UTI's who presents with her daughter, Beverly Barrett.  Patient states that she has had urinary incontinence for three years.   Patient has incontinence with urge and stress.   She is experiencing 2 to 3 incontinent episodes during the day. She is experiencing "a few times" incontinent episodes during the night.  Her incontinence volume is moderate.   She is wearing pads/depends when she leaves the house.  She is having associated urgency x 5 and nocturia x 4.  She does not have a history of urinary tract infections, STI's or injury to the bladder.  She endorses suprapubic pain x 2 months, back pain, abdominal pain and flank pain.   8/10 pain.  She describes the pain as throbbing.  Going from a sitting position to a standing position and the urge to urinate make the pain worse.  Emptying the bladder and laying down with her knees tucked help the pain.   She has nausea, but she has not had any recent fevers, chills, or vomiting.  She does have a history of GU surgery (bladder tacking), but she does not have a history of nephrolithiasis or GU trauma.   She is sexually active.  She feels like something is being bumped in her vagina during sex.  She has noted incontinence with sexual intercourse.  She has not noted a correlation with her urinary tract infections and sexual intercourse.  She does not engage in anal sex.   She is voiding before and after  sex.   She is post menopausal.   She admits to constipation.  She is having pain with bladder filling.  She has not had any recent imaging studies.  She is drinking 3 or 4 bottles of water daily.   She is drinking 1 cup caffeinated beverages daily.  She is not drinking alcoholic beverages daily.  Her risk factors for incontinence are obesity, vaginal delivery, a family history of incontinence, age, caffeine, depression, vaginal atrophy and pelvic surgery.   She is taking benzo's, diuretics and antidepressants. Patient states that she has had two urinary tract infections over the last year.  Her symptoms with a urinary tract infection consist of fevers and chills.  Reviewing her records,  she has had 3 documented positive urine cultures for Escherichia coli over the last 2 years.  She does engage in good perineal hygiene. She does not take tub baths.   Her PVR is 94 mL.    Mixed urinary incontinence The patient has been experiencing urgency x 4-7 (stable), frequency x 4-7 (improve), is restricting fluids to avoid visits to the restroom, is engaging in toilet mapping, incontinence x 0-3 (stable) and nocturia x 4-7 (stable).   Her PVR is 0 mL.  She feels the Myrbetriq is helping and will like to continue the medication, but it is cost prohibitive.    Vaginal atrophy She is applying the vaginal cream 3 nights weekly. She's not  had vaginal irritation or rash.  PMH: Past Medical History:  Diagnosis Date  . Anginal pain (Beverly Barrett)    none in approx 2 yrs  . Anxiety   . Asthma   . Chronic lower back pain   . Common migraine with intractable migraine 09/20/2016  . Depression    suicidal ideation  . GERD (gastroesophageal reflux disease)   . Headache    migraines -2x/month  . Hyperlipidemia   . Hypertension   . Hypothyroidism   . Motion sickness    cars  . Shortness of breath dyspnea   . Sleep apnea    No machine  . Vertigo   . Wears dentures    partial upper    Surgical History: Past Surgical  History:  Procedure Laterality Date  . ABDOMINAL HYSTERECTOMY    . bladder tach  1995  . CESAREAN SECTION    . COLONOSCOPY WITH PROPOFOL N/A 05/29/2015   Procedure: COLONOSCOPY WITH PROPOFOL;  Surgeon: Lucilla Lame, MD;  Location: [facility];  Service: Endoscopy;  Laterality: N/A;  Latex allergy sleep apnea - no CPAP machine (yet)  . POLYPECTOMY  05/29/2015   Procedure: POLYPECTOMY;  Surgeon: Lucilla Lame, MD;  Location: [facility];  Service: Endoscopy;;    Home Medications:  Allergies as of 02/13/2017      Reactions   Latex    Pt unsure of reaction pt states something happened while in the hospital after surgery.      Medication List       Accurate as of 02/13/17 10:57 AM. Always use your most recent med list.          ALPRAZolam 0.5 MG 24 hr tablet Commonly known as:  XANAX XR Take 1 tablet (0.5 mg total) by mouth daily.   aspirin 81 MG EC tablet Take 81 mg by mouth daily.   atorvastatin 40 MG tablet Commonly known as:  LIPITOR TAKE 1 TABLET BY MOUTH AT BEDTIME. GENERIC EQUIVALENT FOR LIPITOR   Azelastine HCl 0.15 % Soln Place 2 sprays into both nostrils 2 (two) times daily.   conjugated estrogens vaginal cream Commonly known as:  PREMARIN Place 1 Applicatorful vaginally daily. Apply 0.5mg  (pea-sized amount)  just inside the vaginal introitus with a finger-tip every night for two weeks and then Monday, Wednesday and Friday nights.   diclofenac 75 MG EC tablet Commonly known as:  VOLTAREN TAKE 1 TABLET BY MOUTH EVERY 12 HOURS AS NEEDED   DULoxetine 60 MG capsule Commonly known as:  CYMBALTA Take 1 capsule (60 mg total) by mouth daily.   EQ ALLERGY RELIEF 10 MG tablet Generic drug:  loratadine TAKE ONE TABLET BY MOUTH ONCE DAILY   fluticasone 50 MCG/ACT nasal spray Commonly known as:  FLONASE by Nasal route.   levothyroxine 88 MCG tablet Commonly known as:  SYNTHROID, LEVOTHROID TAKE 1 TABLET BY MOUTH DAILY BEFORE BREAKFAST.STOP  LEVOTHYROXINE 100 MCG. SANDOZ GENERIC ONLY   linaclotide 145 MCG Caps capsule Commonly known as:  LINZESS TAKE 1 CAPSULE BY MOUTH ONCE DAILY BEFORE BREAKFAST   metoprolol succinate 25 MG 24 hr tablet Commonly known as:  TOPROL XL Take 1 tablet (25 mg total) by mouth daily.   mirabegron ER 25 MG Tb24 tablet Commonly known as:  MYRBETRIQ Take 1 tablet (25 mg total) by mouth daily.   montelukast 10 MG tablet Commonly known as:  SINGULAIR Take 1 tablet (10 mg total) by mouth at bedtime.   nitroGLYCERIN 0.4 MG SL tablet Commonly known as:  NITROSTAT Place 1 tablet (0.4 mg total) under the tongue every 5 (five) minutes as needed.   olmesartan-hydrochlorothiazide 40-12.5 MG tablet Commonly known as:  BENICAR HCT Take 1 tablet by mouth daily.   omeprazole 40 MG capsule Commonly known as:  PRILOSEC Take 1 capsule (40 mg total) by mouth daily.   oxybutynin 5 MG tablet Commonly known as:  DITROPAN Take 1 tablet (5 mg total) by mouth 3 (three) times daily.   QUEtiapine 25 MG tablet Commonly known as:  SEROQUEL Take 1 tablet (25 mg total) by mouth at bedtime.   rizatriptan 10 MG tablet Commonly known as:  MAXALT Take 1 tablet (10 mg total) by mouth 3 (three) times daily as needed for migraine.   topiramate 25 MG tablet Commonly known as:  TOPAMAX Take 4 tablets (100 mg total) by mouth at bedtime.   traMADol 50 MG tablet Commonly known as:  ULTRAM Take by mouth.       Allergies:  Allergies  Allergen Reactions  . Latex     Pt unsure of reaction pt states something happened while in the hospital after surgery.    Family History: Family History  Problem Relation Age of Onset  . Diabetes Sister   . Hypertension Sister   . Cirrhosis Mother   . Cirrhosis Father   . Breast cancer Sister 32  . Heart attack Brother   . Prostate cancer Neg Hx   . Kidney cancer Neg Hx   . Bladder Cancer Neg Hx     Social History:  reports that she has never smoked. She has never used  smokeless tobacco. She reports that she does not drink alcohol or use drugs.  ROS: UROLOGY Frequent Urination?: Yes Hard to postpone urination?: No Burning/pain with urination?: No Get up at night to urinate?: Yes Leakage of urine?: Yes Urine stream starts and stops?: No Trouble starting stream?: No Do you have to strain to urinate?: No Blood in urine?: No Urinary tract infection?: No Sexually transmitted disease?: No Injury to kidneys or bladder?: No Painful intercourse?: No Weak stream?: No Currently pregnant?: No Vaginal bleeding?: No Last menstrual period?: n  Gastrointestinal Nausea?: Yes Vomiting?: No Indigestion/heartburn?: No Diarrhea?: No Constipation?: No  Constitutional Fever: No Night sweats?: Yes Weight loss?: No Fatigue?: No  Skin Skin rash/lesions?: No Itching?: No  Eyes Blurred vision?: No Double vision?: No  Ears/Nose/Throat Sore throat?: No Sinus problems?: No  Hematologic/Lymphatic Swollen glands?: No Easy bruising?: No  Cardiovascular Leg swelling?: No Chest pain?: Yes  Respiratory Cough?: No Shortness of breath?: No  Endocrine Excessive thirst?: No  Musculoskeletal Back pain?: Yes Joint pain?: Yes  Neurological Headaches?: Yes Dizziness?: No  Psychologic Depression?: Yes Anxiety?: Yes  Physical Exam: BP 130/80   Pulse 81   Ht 5\' 5"  (1.651 m)   Wt 242 lb 6.4 oz (110 kg)   BMI 40.34 kg/m   Constitutional: Well nourished. Alert and oriented, No acute distress. HEENT: [place] AT, moist mucus membranes. Trachea midline, no masses. Cardiovascular: No clubbing, cyanosis, or edema. Respiratory: Normal respiratory effort, no increased work of breathing. GI: Abdomen is soft, non tender, non distended, no abdominal masses. Liver and spleen not palpable.  No hernias appreciated.  Stool sample for occult testing is not indicated.   GU: No CVA tenderness.  No bladder fullness or masses.  Normal external genitalia, normal pubic  hair distribution, no lesions.  Normal urethral meatus, no lesions, no prolapse, no discharge.   No urethral masses, tenderness and/or tenderness. No  bladder fullness, tenderness or masses. Normal vagina mucosa, good estrogen effect, no discharge, no lesions, good pelvic support, no cystocele or rectocele noted.  Cervix, uterus and adnexa are surgically absent.  Anus and perineum are without rashes or lesions.    Skin: No rashes, bruises or suspicious lesions. Lymph: No cervical or inguinal adenopathy. Neurologic: Grossly intact, no focal deficits, moving all 4 extremities. Psychiatric: Normal mood and affect.  Laboratory Data: Lab Results  Component Value Date   WBC 11.1 (H) 02/12/2017   HGB 12.4 02/12/2017   HCT 37.7 02/12/2017   MCV 84.3 02/12/2017   PLT 253 02/12/2017    Lab Results  Component Value Date   CREATININE 0.74 07/09/2016     Lab Results  Component Value Date   HGBA1C 6.1 (H) 02/12/2017    Lab Results  Component Value Date   TSH 2.21 02/12/2017       Component Value Date/Time   CHOL 176 02/12/2017 1059   CHOL 192 12/20/2015 1012   HDL 54 02/12/2017 1059   HDL 46 12/20/2015 1012   CHOLHDL 3.3 02/12/2017 1059   VLDL 44 (H) 02/12/2017 1059   LDLCALC 78 02/12/2017 1059   LDLCALC 103 (H) 12/20/2015 1012    Lab Results  Component Value Date   AST 15 07/09/2016   Lab Results  Component Value Date   ALT 11 07/09/2016   I have reviewed the labs  Pertinent Imaging: Results for Beverly, LAUMANN (MRN [id number]) as of 02/13/2017 10:42  Ref. Range 02/13/2017 10:06  Scan Result Unknown 0      Assessment & Plan:    1. Mixed urinary Incontinence  - Patient is having good response with Myrbetriq 25 mg daily, but her insurance requires step therapy  - will have a trial of oxybutynin 5 mg tid; discussed side effects  - Return to clinic in 1 month for OAB questionnaire and PVR                              2. Vaginal atrophy  - Continue Premarin cream 3  nights weekly  - A refill is sent to her pharmacy  - She'll return to clinic in 12 months for exam  Return in about 1 month (around 03/16/2017) for PVR and OAB questionnaire.  These notes generated with voice recognition software. I apologize for typographical errors.  Zara Council, [facility] [address] [place], [place] [address] [phone]

## 2017-02-12 NOTE — Progress Notes (Signed)
Name: Beverly Barrett   MRN: [id number]    DOB: 1961-02-23   Date:02/12/2017       Progress Note  Subjective  Chief Complaint  Chief Complaint  Patient presents with  . Migraine    Patient was told by [facility] Neurology to take Nortripyline she would have to be weaned off of Cymbalta and Seroquel.   . Depression    Patient states she feels it is getting worst  . Medication Refill  . Hypothyroidism    Having sweats constantly and getting worst.    HPI  Major Depression/GAD: She has a long history of depression and was started on Paroxetine 40 mg in the morning and Depakote 250 mg DR one tablet in the evenings, however since Depakote was not helping we tried Buspar ( May 2017 ) but again no improvement of symptoms. .We added Alprazolam XR 0.5 mg July 2017 and she has noticed some improvement in the anxiety level, but still has severe symptoms, we added Cymbalta 02/2016 and she was doing better, however feeling worse, feels like she is burden to her family.She still has occasionally has suicidal ideation but no planning ( she states she would not do it because of her grandson ) - she feels guilty about not contributing financially to her family and also because her health care is so expensive, so she feels like her family would be better off without her. She complains of depressed mood, fatigue, feelings of worthlessness/guilt, and insomnia ( that is controlled with Seroquel) . Family history significant for anxiety and depression, one of her brother's attempted suicide many years ago. She has severe anxiety, difficulty interacting with others, she picks on her nails and makes herself bleed at times, constantly shaking her legs, restless, always worried but no longer snappy. Discussed psychiatric evaluation and she is ready to see a psychiatrist now. She denies weapons in her house.  HTN: she taking her medication Valsartan HCTZ and started on Toprol XL , July 2017. She has noticed that  fluttering sensation has improved with beta-blockers, bp is now at goal, we will change to Benicar because of recall  Angina: had evaluation by cardiologist and Echo myoview in July normal result. She states chest pain still happens and at times during rest. She is on statin, aspirin, beta-blocker , ARB and has been taking NTG intermittently, but did not have to take any NTG since last visit.  Pain is described as throbbing sensation on left side of chest and sometimes radiates to her left shoulder and back.   Constipation: she was started on Linzess July 2017 and states has bowel movements daily , no straining or blood in stools. No side effects of medications   OSA: using CPAP every night, all night, and we started her on Seroquel on her last visit. It still takes her 2 hours to fall asleep but is able to stay asleep for about 7 hours.   Obesity: weight is stable, discussed life style modification again   GERD: taking Omeprazole, she stopped taking Goody Powders as often, only taking Diclofenac once a day and symptoms resolved., but advised to change to prn use, negative hemoccult test, but she has anemia  Dyslipidemia: high triglycerides back in May and LDL was 103 She is on statin therapy  Hyperglycemia: last hgbA1C was 6.2%, she has noticed polyphagia, polydipsia and polyuria lately. We will recheck labs  Migraine: seen at the headache center by Dr. Jannifer Franklin, started on Topamax and Maxalt recently, still having headaches at  this time, she was recently advised to try stopping Duloxetine and Seroquel and start Nortriptyline but had to discuss it with me first, advised not to switch until seen by psychiatrist to manage her depression   Urinary urgency and incontinence: symptoms of low back and supra pubic pain again, urgency, nocturia, and sometimes she cannot make it to the bathroom. Seen by Urologist PA and is on Myrbetric and also premarin for vaginal atrophy, she has notice mild  improvement of symptoms while on medication, but now off Myrbetriq and waiting for PA.  Hypothyroidism she is currently taking medication daily, she has chronic dry skin and constipation ( resolved with Linzes), last TSH was suppressed, we will recheck levels , on Synthroid 88 mcg daily for the 6 weeks  DDD lumbar spine: had MRI done, ordered by Ortho and was found to have DDD lumbar spine, seeing Dr. Sharlet Salina and had steroid injection with mild improvement of symptoms, states not currently having pain radiating to lower extremities.    Patient Active Problem List   Diagnosis Date Noted  . Mixed stress and urge urinary incontinence 11/12/2016  . Degenerative arthritis of lumbar spine 11/12/2016  . Common migraine with intractable migraine 09/20/2016  . Iron deficiency anemia 09/12/2016  . GAD (generalized anxiety disorder) 12/20/2015  . OSA on CPAP 12/20/2015  . Angina effort ([place]) 12/20/2015  . Hyperglycemia 09/22/2015  . History of colonic polyps   . Benign neoplasm of sigmoid colon   . Frequency 04/14/2015  . Midline low back pain without sciatica 04/14/2015  . Chronic pain of multiple joints 06/14/2014  . Dysmetabolic syndrome 25/95/6387  . Acid reflux 06/14/2014  . Extreme obesity 06/14/2014  . Arthritis, degenerative 06/14/2014  . Morbid obesity with BMI of 40.0-44.9, adult ([place]) 06/14/2014  . Hypothyroidism due to acquired atrophy of thyroid 08/18/2013  . Hyperlipidemia 12/07/2010  . Hypertension goal BP (blood pressure) < 140/90 12/07/2010  . Familial multiple lipoprotein-type hyperlipidemia 12/07/2010  . CN (constipation) 11/21/2009  . Allergic rhinitis 10/13/2008  . Asthma, mild intermittent, well-controlled 09/01/2008  . Major depressive disorder, recurrent episode, in partial remission with anxious distress ([place]) 08/13/2007    Past Surgical History:  Procedure Laterality Date  . ABDOMINAL HYSTERECTOMY    . bladder tach  1995  . CESAREAN SECTION    . COLONOSCOPY  WITH PROPOFOL N/A 05/29/2015   Procedure: COLONOSCOPY WITH PROPOFOL;  Surgeon: Lucilla Lame, MD;  Location: [place];  Service: Endoscopy;  Laterality: N/A;  Latex allergy sleep apnea - no CPAP machine (yet)  . POLYPECTOMY  05/29/2015   Procedure: POLYPECTOMY;  Surgeon: Lucilla Lame, MD;  Location: [place];  Service: Endoscopy;;    Family History  Problem Relation Age of Onset  . Diabetes Sister   . Hypertension Sister   . Cirrhosis Mother   . Cirrhosis Father   . Breast cancer Sister 24  . Heart attack Brother   . Prostate cancer Neg Hx   . Kidney cancer Neg Hx   . Bladder Cancer Neg Hx     Social History   Social History  . Marital status: Married    Spouse name: N/A  . Number of children: N/A  . Years of education: N/A   Occupational History  . Not on file.   Social History Main Topics  . Smoking status: Never Smoker  . Smokeless tobacco: Never Used  . Alcohol use No  . Drug use: No  . Sexual activity: Yes    Partners: Male  Other Topics Concern  . Not on file   Social History Narrative   Lives with husband and one daughter other kids are out of the house   She does not work, husband on disability         Current Outpatient Prescriptions:  .  ALPRAZolam (XANAX XR) 0.5 MG 24 hr tablet, Take 1 tablet (0.5 mg total) by mouth daily., Disp: 30 tablet, Rfl: 2 .  aspirin 81 MG EC tablet, Take 81 mg by mouth daily.  , Disp: , Rfl:  .  atorvastatin (LIPITOR) 40 MG tablet, TAKE 1 TABLET BY MOUTH AT BEDTIME. GENERIC EQUIVALENT FOR LIPITOR, Disp: 90 tablet, Rfl: 1 .  Azelastine HCl 0.15 % SOLN, Place 2 sprays into both nostrils 2 (two) times daily. , Disp: , Rfl: 3 .  conjugated estrogens (PREMARIN) vaginal cream, Place 1 Applicatorful vaginally daily. Apply 0.5mg  (pea-sized amount)  just inside the vaginal introitus with a finger-tip every night for two weeks and then Monday, Wednesday and Friday nights., Disp: 30 g, Rfl: 12 .  diclofenac  (VOLTAREN) 75 MG EC tablet, TAKE 1 TABLET BY MOUTH EVERY 12 HOURS AS NEEDED, Disp: 180 tablet, Rfl: 0 .  DULoxetine (CYMBALTA) 60 MG capsule, Take 1 capsule (60 mg total) by mouth daily., Disp: 90 capsule, Rfl: 1 .  EQ ALLERGY RELIEF 10 MG tablet, TAKE ONE TABLET BY MOUTH ONCE DAILY, Disp: 90 tablet, Rfl: 1 .  fluticasone (FLONASE) 50 MCG/ACT nasal spray, by Nasal route., Disp: , Rfl:  .  levothyroxine (SYNTHROID, LEVOTHROID) 88 MCG tablet, TAKE 1 TABLET BY MOUTH DAILY BEFORE BREAKFAST.STOP LEVOTHYROXINE 100 MCG. SANDOZ GENERIC ONLY, Disp: 90 tablet, Rfl: 0 .  linaclotide (LINZESS) 145 MCG CAPS capsule, TAKE 1 CAPSULE BY MOUTH ONCE DAILY BEFORE BREAKFAST, Disp: 90 capsule, Rfl: 1 .  metoprolol succinate (TOPROL XL) 25 MG 24 hr tablet, Take 1 tablet (25 mg total) by mouth daily., Disp: 90 tablet, Rfl: 1 .  mirabegron ER (MYRBETRIQ) 25 MG TB24 tablet, Take 1 tablet (25 mg total) by mouth daily., Disp: 30 tablet, Rfl: 11 .  montelukast (SINGULAIR) 10 MG tablet, Take 1 tablet (10 mg total) by mouth at bedtime., Disp: 90 tablet, Rfl: 1 .  nitroGLYCERIN (NITROSTAT) 0.4 MG SL tablet, Place 1 tablet (0.4 mg total) under the tongue every 5 (five) minutes as needed., Disp: 25 tablet, Rfl: 6 .  omeprazole (PRILOSEC) 40 MG capsule, Take 1 capsule (40 mg total) by mouth daily., Disp: 90 capsule, Rfl: 1 .  QUEtiapine (SEROQUEL) 25 MG tablet, Take 1 tablet (25 mg total) by mouth at bedtime., Disp: 90 tablet, Rfl: 1 .  rizatriptan (MAXALT) 10 MG tablet, Take 1 tablet (10 mg total) by mouth 3 (three) times daily as needed for migraine., Disp: 10 tablet, Rfl: 3 .  topiramate (TOPAMAX) 25 MG tablet, Take 4 tablets (100 mg total) by mouth at bedtime., Disp: 120 tablet, Rfl: 5 .  traMADol (ULTRAM) 50 MG tablet, Take by mouth., Disp: , Rfl:  .  olmesartan-hydrochlorothiazide (BENICAR HCT) 40-12.5 MG tablet, Take 1 tablet by mouth daily., Disp: 90 tablet, Rfl: 1  Allergies  Allergen Reactions  . Latex     Pt unsure of  reaction pt states something happened while in the hospital after surgery.     ROS  Constitutional: Negative for fever or weight change.  Respiratory: Negative for cough , but she has shortness of breath at night    Cardiovascular: Negative for chest pain or palpitations.  Gastrointestinal: Negative for  abdominal pain, no bowel changes.  Musculoskeletal: Negative for gait problem or joint swelling.  Skin: Negative for rash.  Neurological: Negative for dizziness or headache.  No other specific complaints in a complete review of systems (except as listed in HPI above).  Objective  Vitals:   02/12/17 1000  BP: 136/74  Pulse: 82  Resp: 16  Temp: 98.3 F (36.8 C)  TempSrc: Oral  SpO2: 94%  Weight: 241 lb 14.4 oz (109.7 kg)  Height: 5\' 5"  (1.651 m)    Body mass index is 40.25 kg/m.  Physical Exam  Constitutional: Patient appears well-developed and well-nourished. Obese  No distress.  HEENT: head atraumatic, normocephalic, pupils equal and reactive to light, neck supple, throat within normal limits Cardiovascular: Normal rate, regular rhythm and normal heart sounds.  No murmur heard. No BLE edema. Pulmonary/Chest: Effort normal and breath sounds normal. No respiratory distress. Abdominal: Soft.  There is no tenderness. Psychiatric: Patient has a normal mood and affect. behavior is normal. Judgment and thought content normal. Muscular Skeletal: pain during palpation of lumbar spine, negative straight leg raise  No results found for this or any previous visit (from the past 2160 hour(s)).    PHQ2/9: Depression screen [facility] 2/9 02/12/2017 10/07/2016 07/09/2016 04/08/2016 03/04/2016  Decreased Interest 0 1 1 1 2   Down, Depressed, Hopeless 0 1 1 1 3   PHQ - 2 Score 0 2 2 2 5   Altered sleeping - 1 1 0 3  Tired, decreased energy - 0 0 0 3  Change in appetite - 0 0 1 2  Feeling bad or failure about yourself  - 0 0 1 3  Trouble concentrating - 0 1 0 0  Moving slowly or  fidgety/restless - 0 0 0 0  Suicidal thoughts - 0 - 0 2  PHQ-9 Score - 3 4 4 18   Difficult doing work/chores - Somewhat difficult Somewhat difficult - Extremely dIfficult    Fall Risk: Fall Risk  02/12/2017 10/07/2016 07/09/2016 04/08/2016 02/02/2016  Falls in the past year? No No Yes Yes No  Number falls in past yr: - - 2 or more 1 -  Injury with Fall? - - No No -  Follow up - - Falls evaluation completed - -     Functional Status Survey: Is the patient deaf or have difficulty hearing?: No Does the patient have difficulty seeing, even when wearing glasses/contacts?: No Does the patient have difficulty concentrating, remembering, or making decisions?: No Does the patient have difficulty walking or climbing stairs?: No Does the patient have difficulty dressing or bathing?: No Does the patient have difficulty doing errands alone such as visiting a doctor's office or shopping?: No   Assessment & Plan  1. Hypertension goal BP (blood pressure) < 140/90  Stop Valsartan because it has been recalled, try Benicar - olmesartan-hydrochlorothiazide (BENICAR HCT) 40-12.5 MG tablet; Take 1 tablet by mouth daily.  Dispense: 90 tablet; Refill: 1  2. Hyperlipidemia with target LDL less than 100  Continue Lipitor   3. GAD (generalized anxiety disorder)  Out of Alprazolam, last rx given March 2018 - DULoxetine (CYMBALTA) 60 MG capsule; Take 1 capsule (60 mg total) by mouth daily.  Dispense: 90 capsule; Refill: 1 - ALPRAZolam (XANAX XR) 0.5 MG 24 hr tablet; Take 1 tablet (0.5 mg total) by mouth daily.  Dispense: 30 tablet; Refill: 2  4. Depression, major, recurrent, moderate (HCC)  - DULoxetine (CYMBALTA) 60 MG capsule; Take 1 capsule (60 mg total) by mouth daily.  Dispense: 90  capsule; Refill: 1  5. Angina pectoris ([place])  No recent episodes, has NTG at home  6. Insomnia, unspecified type  Doing well on medication  - QUEtiapine (SEROQUEL) 25 MG tablet; Take 1 tablet (25 mg total) by mouth  at bedtime.  Dispense: 90 tablet; Refill: 1  7. Hypothyroidism due to acquired atrophy of thyroid  Continue medication   8. Gastroesophageal reflux disease without esophagitis  - omeprazole (PRILOSEC) 40 MG capsule; Take 1 capsule (40 mg total) by mouth daily.  Dispense: 90 capsule; Refill: 1  9. Asthma, mild intermittent, well-controlled  - montelukast (SINGULAIR) 10 MG tablet; Take 1 tablet (10 mg total) by mouth at bedtime.  Dispense: 90 tablet; Refill: 1  10. Chronic constipation  - linaclotide (LINZESS) 145 MCG CAPS capsule; TAKE 1 CAPSULE BY MOUTH ONCE DAILY BEFORE BREAKFAST  Dispense: 90 capsule; Refill: 1  11. Chronic pain of multiple joints  - DULoxetine (CYMBALTA) 60 MG capsule; Take 1 capsule (60 mg total) by mouth daily.  Dispense: 90 capsule; Refill: 1  12. Mixed stress and urge urinary incontinence  Did well on Myrbetriq but not covered by insurance waiting for PA from Urologist office  13. Chronic midline low back pain without sciatica  Seen by Dr. Rudene Christians and Also Chaniss, getting injection with mild improvement   14. Common migraine with intractable migraine  Going to [facility] and NP advised to stop Duloxetine and Seroquel and start Nortriptyline however I do not recommend the change since patient is sleeping well and has depression and chronic pain, consider going up on Topamax dose  15. Hyperglycemia  - Hemoglobin A1c - Insulin, fasting  16. Anemia, unspecified type  - CBC with Differential/Platelet

## 2017-02-13 ENCOUNTER — Encounter: Payer: Self-pay | Admitting: Urology

## 2017-02-13 ENCOUNTER — Other Ambulatory Visit: Payer: Self-pay

## 2017-02-13 ENCOUNTER — Telehealth: Payer: Self-pay | Admitting: Cardiology

## 2017-02-13 ENCOUNTER — Ambulatory Visit (INDEPENDENT_AMBULATORY_CARE_PROVIDER_SITE_OTHER): Payer: BLUE CROSS/BLUE SHIELD | Admitting: Urology

## 2017-02-13 VITALS — BP 130/80 | HR 81 | Ht 65.0 in | Wt 242.4 lb

## 2017-02-13 DIAGNOSIS — N3946 Mixed incontinence: Secondary | ICD-10-CM

## 2017-02-13 DIAGNOSIS — N952 Postmenopausal atrophic vaginitis: Secondary | ICD-10-CM

## 2017-02-13 LAB — INSULIN, FASTING: INSULIN FASTING, SERUM: 36.3 u[IU]/mL — AB (ref 2.0–19.6)

## 2017-02-13 LAB — LIPID PANEL
Cholesterol: 176 mg/dL (ref <200)
HDL: 54 mg/dL (ref >50)
LDL CALC: 78 mg/dL (ref <100)
Total CHOL/HDL Ratio: 3.3 Ratio (ref <5.0)
Triglycerides: 222 mg/dL — ABNORMAL HIGH (ref <150)
VLDL: 44 mg/dL — AB (ref <30)

## 2017-02-13 LAB — HEMOGLOBIN A1C
Hgb A1c MFr Bld: 6.1 % — ABNORMAL HIGH (ref <5.7)
Mean Plasma Glucose: 128 mg/dL

## 2017-02-13 LAB — TSH: TSH: 2.21 mIU/L

## 2017-02-13 LAB — BLADDER SCAN AMB NON-IMAGING: SCAN RESULT: 0

## 2017-02-13 MED ORDER — ESTROGENS, CONJUGATED 0.625 MG/GM VA CREA: 1.0000 | TOPICAL_CREAM | Freq: Every day | VAGINAL | 12 refills | Status: DC

## 2017-02-13 MED ORDER — NITROGLYCERIN 0.4 MG SL SUBL: 0.4000 mg | SUBLINGUAL_TABLET | SUBLINGUAL | 1 refills | Status: DC | PRN

## 2017-02-13 MED ORDER — OXYBUTYNIN CHLORIDE 5 MG PO TABS: 5.0000 mg | ORAL_TABLET | Freq: Three times a day (TID) | ORAL | 0 refills | Status: DC

## 2017-02-13 NOTE — Telephone Encounter (Signed)
Patient states that last night she had some bad chest pain that lasted about an hour while in bed. She states that eventually it did ease off. She wears CPAP machine and was a little short of breath during that episode. Patient states that she had some left arm pain last week. She states that the nitro is old so sent in refill for her. Instructed her to please go to ED if symptoms should return or worsen and scheduled her to come in next week to see Dr. Rockey Situ. She was appreciative for the call and had no further questions at this time.

## 2017-02-13 NOTE — Telephone Encounter (Signed)
Refill Request.  

## 2017-02-13 NOTE — Telephone Encounter (Signed)
Left voicemail message to call back  

## 2017-02-13 NOTE — Telephone Encounter (Signed)
Pt c/o of Chest Pain: STAT if CP now or developed within 24 hours  1. Are you having CP right now? No   2. Are you experiencing any other symptoms (ex. SOB, nausea, vomiting, sweating)? no  3. How long have you been experiencing CP? Last night , lasted about an hour  4. Is your CP continuous or coming and going? Continual, steady  5. Have you taken Nitroglycerin? No, They were out of date ?

## 2017-02-13 NOTE — Telephone Encounter (Signed)
Last seen by dr. Yvone Neu 7/17 Needs to establish with any provider since she is having CP

## 2017-02-14 ENCOUNTER — Telehealth: Payer: Self-pay | Admitting: *Deleted

## 2017-02-14 NOTE — Telephone Encounter (Signed)
error 

## 2017-02-19 NOTE — Progress Notes (Signed)
Cardiology Office Note  Date:  02/20/2017   ID:  Beverly Barrett, DOB [birth year], MRN [id number]  PCP:  Steele Sizer, MD   Chief Complaint  Patient presents with  . other    Former ingal patient. Patient c.o chest pain last week, swelling in ankles and SOB. Meds reviewed verbally with patient.     HPI:  Beverly Barrett is a 56 y.o. female  Past medical history of chest discomfort, dating back More than 6 years Previous stress test echocardiogram have been normal Previously recommended she take PPI, Nonsmoker No diabetes,  She presents today for follow-up of her chest pain symptoms  In general she reports feeling well, denies having any further chest pain No regular exercise program  Previous studies reviewed with her in detail Echocardiogram July 50,017 was normal, ejection fraction greater than 65% Stress test 01/23/2016 was normal, low risk scan, Myoview  Feels the higher dose PPI has helped her chest pain symptoms  Patient denies fever, cough, colds, abdominal pain. No PND, orthopnea or edema. No palpitations. No loss of consciousness.  EKG personally reviewed by myself on todays visit Shows normal sinus rhythm with no significant ST or T-wave changes, rate 69 bpm    PMH:   has a past medical history of Anginal pain ([place]); Anxiety; Asthma; Chronic lower back pain; Common migraine with intractable migraine (09/20/2016); Depression; GERD (gastroesophageal reflux disease); Headache; Hyperlipidemia; Hypertension; Hypothyroidism; Motion sickness; Shortness of breath dyspnea; Sleep apnea; Vertigo; and Wears dentures.  PSH:    Past Surgical History:  Procedure Laterality Date  . ABDOMINAL HYSTERECTOMY    . bladder tach  1995  . CESAREAN SECTION    . COLONOSCOPY WITH PROPOFOL N/A 05/29/2015   Procedure: COLONOSCOPY WITH PROPOFOL;  Surgeon: Lucilla Lame, MD;  Location: [place];  Service: Endoscopy;  Laterality: N/A;  Latex allergy sleep apnea - no CPAP machine  (yet)  . POLYPECTOMY  05/29/2015   Procedure: POLYPECTOMY;  Surgeon: Lucilla Lame, MD;  Location: [place];  Service: Endoscopy;;    Current Outpatient Prescriptions  Medication Sig Dispense Refill  . ALPRAZolam (XANAX XR) 0.5 MG 24 hr tablet Take 1 tablet (0.5 mg total) by mouth daily. 30 tablet 2  . aspirin 81 MG EC tablet Take 81 mg by mouth daily.      Marland Kitchen atorvastatin (LIPITOR) 40 MG tablet TAKE 1 TABLET BY MOUTH AT BEDTIME. GENERIC EQUIVALENT FOR LIPITOR 90 tablet 1  . Azelastine HCl 0.15 % SOLN Place 2 sprays into both nostrils 2 (two) times daily.   3  . conjugated estrogens (PREMARIN) vaginal cream Place 1 Applicatorful vaginally daily. Apply 0.5mg  (pea-sized amount)  just inside the vaginal introitus with a finger-tip every night for two weeks and then Monday, Wednesday and Friday nights. 30 g 12  . diclofenac (VOLTAREN) 75 MG EC tablet TAKE 1 TABLET BY MOUTH EVERY 12 HOURS AS NEEDED 180 tablet 0  . DULoxetine (CYMBALTA) 60 MG capsule Take 1 capsule (60 mg total) by mouth daily. 90 capsule 1  . EQ ALLERGY RELIEF 10 MG tablet TAKE ONE TABLET BY MOUTH ONCE DAILY 90 tablet 1  . fluticasone (FLONASE) 50 MCG/ACT nasal spray by Nasal route.    Marland Kitchen levothyroxine (SYNTHROID, LEVOTHROID) 88 MCG tablet TAKE 1 TABLET BY MOUTH DAILY BEFORE BREAKFAST.STOP LEVOTHYROXINE 100 MCG. SANDOZ GENERIC ONLY 90 tablet 0  . linaclotide (LINZESS) 145 MCG CAPS capsule TAKE 1 CAPSULE BY MOUTH ONCE DAILY BEFORE BREAKFAST 90 capsule 1  . metoprolol succinate (TOPROL XL) 25  MG 24 hr tablet Take 1 tablet (25 mg total) by mouth daily. 90 tablet 1  . mirabegron ER (MYRBETRIQ) 25 MG TB24 tablet Take 1 tablet (25 mg total) by mouth daily. 30 tablet 11  . montelukast (SINGULAIR) 10 MG tablet Take 1 tablet (10 mg total) by mouth at bedtime. 90 tablet 1  . nitroGLYCERIN (NITROSTAT) 0.4 MG SL tablet Place 1 tablet (0.4 mg total) under the tongue every 5 (five) minutes as needed. 25 tablet 0  . nitroGLYCERIN  (NITROSTAT) 0.4 MG SL tablet Place 1 tablet (0.4 mg total) under the tongue every 5 (five) minutes as needed. 25 tablet 1  . olmesartan-hydrochlorothiazide (BENICAR HCT) 40-12.5 MG tablet Take 1 tablet by mouth daily. 90 tablet 1  . omeprazole (PRILOSEC) 40 MG capsule Take 1 capsule (40 mg total) by mouth daily. 90 capsule 1  . oxybutynin (DITROPAN) 5 MG tablet Take 1 tablet (5 mg total) by mouth 3 (three) times daily. 90 tablet 0  . QUEtiapine (SEROQUEL) 25 MG tablet Take 1 tablet (25 mg total) by mouth at bedtime. 90 tablet 1  . rizatriptan (MAXALT) 10 MG tablet Take 1 tablet (10 mg total) by mouth 3 (three) times daily as needed for migraine. 10 tablet 3  . topiramate (TOPAMAX) 25 MG tablet Take 4 tablets (100 mg total) by mouth at bedtime. 120 tablet 5  . traMADol (ULTRAM) 50 MG tablet Take by mouth.     No current facility-administered medications for this visit.      Allergies:   Latex   Social History:  The patient  reports that she has never smoked. She has never used smokeless tobacco. She reports that she does not drink alcohol or use drugs.   Family History:   family history includes Breast cancer (age of onset: 63) in her sister; Cirrhosis in her father and mother; Diabetes in her sister; Heart attack in her brother; Hypertension in her sister.    Review of Systems: Review of Systems  Constitutional: Negative.   Respiratory: Negative.   Cardiovascular: Negative.   Gastrointestinal: Negative.   Musculoskeletal: Negative.   Neurological: Negative.   Psychiatric/Behavioral: Negative.   All other systems reviewed and are negative.    PHYSICAL EXAM: VS:  BP (!) 148/84 (BP Location: Left Arm, Patient Position: Sitting, Cuff Size: Normal)   Pulse 69   Ht 5\' 4"  (1.626 m)   Wt 244 lb 8 oz (110.9 kg)   BMI 41.97 kg/m  , BMI Body mass index is 41.97 kg/m. GEN: Well nourished, well developed, in no acute distress , obese HEENT: normal  Neck: no JVD, carotid bruits, or  masses Cardiac: RRR; no murmurs, rubs, or gallops,no edema  Respiratory:  clear to auscultation bilaterally, normal work of breathing GI: soft, nontender, nondistended, + BS MS: no deformity or atrophy  Skin: warm and dry, no rash Neuro:  Strength and sensation are intact Psych: euthymic mood, full affect    Recent Labs: 07/09/2016: ALT 11; BUN 16; Creat 0.74; Potassium 3.3; Sodium 142 02/12/2017: Hemoglobin 12.4; Platelets 253; TSH 2.21    Lipid Panel Lab Results  Component Value Date   CHOL 176 02/12/2017   HDL 54 02/12/2017   LDLCALC 78 02/12/2017   TRIG 222 (H) 02/12/2017      Wt Readings from Last 3 Encounters:  02/20/17 244 lb 8 oz (110.9 kg)  02/13/17 242 lb 6.4 oz (110 kg)  02/12/17 241 lb 14.4 oz (109.7 kg)       ASSESSMENT AND  PLAN:  Angina effort ([place]) - Plan: EKG 12-Lead Atypical chest pain, none recently Previous chest pain worked up with stress testing showing no ischemia Normal echocardiogram, results discussed with her No further testing needed at this time We did discuss CT coronary calcium scoring if she has recurrent chest pain  Mixed hyperlipidemia Cholesterol is at goal on the current lipid regimen. No changes to the medications were made.  Hypertension goal BP (blood pressure) < 140/90 Blood pressure is well controlled on today's visit. No changes made to the medications.  Morbid obesity with BMI of 40.0-44.9, adult ([place]) We have encouraged continued exercise, careful diet management in an effort to lose weight.  OSA on CPAP Reports that she is compliant with her CPAP   Total encounter time more than 25 minutes  Greater than 50% was spent in counseling and coordination of care with the patient   Disposition:   F/U  12 months   Orders Placed This Encounter  Procedures  . EKG 12-Lead     Signed, Esmond Plants, M.D., Ph.D. 02/20/2017  [place], [place]

## 2017-02-20 ENCOUNTER — Ambulatory Visit (INDEPENDENT_AMBULATORY_CARE_PROVIDER_SITE_OTHER): Payer: BLUE CROSS/BLUE SHIELD | Admitting: Cardiovascular Disease

## 2017-02-20 ENCOUNTER — Encounter: Payer: Self-pay | Admitting: Cardiovascular Disease

## 2017-02-20 VITALS — BP 148/84 | HR 69 | Ht 64.0 in | Wt 244.5 lb

## 2017-02-20 DIAGNOSIS — G4733 Obstructive sleep apnea (adult) (pediatric): Secondary | ICD-10-CM | POA: Diagnosis not present

## 2017-02-20 DIAGNOSIS — I208 Other forms of angina pectoris: Secondary | ICD-10-CM

## 2017-02-20 DIAGNOSIS — I10 Essential (primary) hypertension: Secondary | ICD-10-CM | POA: Diagnosis not present

## 2017-02-20 DIAGNOSIS — E782 Mixed hyperlipidemia: Secondary | ICD-10-CM | POA: Diagnosis not present

## 2017-02-20 DIAGNOSIS — Z6841 Body Mass Index (BMI) 40.0 and over, adult: Secondary | ICD-10-CM

## 2017-02-20 DIAGNOSIS — Z9989 Dependence on other enabling machines and devices: Secondary | ICD-10-CM | POA: Diagnosis not present

## 2017-02-20 MED ORDER — NITROGLYCERIN 0.4 MG SL SUBL: 0.4000 mg | SUBLINGUAL_TABLET | SUBLINGUAL | 0 refills | Status: DC | PRN

## 2017-02-20 NOTE — Patient Instructions (Signed)

## 2017-03-16 NOTE — Progress Notes (Signed)
03/17/2017 10:31 AM   [name] Jul 28, 1961 [id number]  Referring provider: Steele Sizer, MD [address] [place], [place] [address]  Chief Complaint  Patient presents with  . Urinary Incontinence    Mixed 1 month follow up  . Vaginal Atrophy    HPI: 56 yo AAF who presents today for a 1 month follow up after starting oxybutynin 5 mg tid for mixed urinary incontinence and Premarin for vaginal atrophy.  Background history Patient is a 38 -year-old [place] American female who is referred to [place] by, Dr. Ancil Boozer, for urinary incontinence and recurrent UTI's who presents with her daughter, [name].  Patient states that she has had urinary incontinence for three years.   Patient has incontinence with urge and stress.   She is experiencing 2 to 3 incontinent episodes during the day. She is experiencing "a few times" incontinent episodes during the night.  Her incontinence volume is moderate.   She is wearing pads/depends when she leaves the house.  She is having associated urgency x 5 and nocturia x 4.  She does not have a history of urinary tract infections, STI's or injury to the bladder.  She endorses suprapubic pain x 2 months, back pain, abdominal pain and flank pain.   8/10 pain.  She describes the pain as throbbing.  Going from a sitting position to a standing position and the urge to urinate make the pain worse.  Emptying the bladder and laying down with her knees tucked help the pain.   She has nausea, but she has not had any recent fevers, chills, or vomiting.  She does have a history of GU surgery (bladder tacking), but she does not have a history of nephrolithiasis or GU trauma.   She is sexually active.  She feels like something is being bumped in her vagina during sex.  She has noted incontinence with sexual intercourse.  She has not noted a correlation with her urinary tract infections and sexual intercourse.  She does not engage in anal sex.   She is voiding before  and after sex.   She is post menopausal.   She admits to constipation.  She is having pain with bladder filling.  She has not had any recent imaging studies.  She is drinking 3 or 4 bottles of water daily.   She is drinking 1 cup caffeinated beverages daily.  She is not drinking alcoholic beverages daily.  Her risk factors for incontinence are obesity, vaginal delivery, a family history of incontinence, age, caffeine, depression, vaginal atrophy and pelvic surgery.   She is taking benzo's, diuretics and antidepressants. Patient states that she has had two urinary tract infections over the last year.  Her symptoms with a urinary tract infection consist of fevers and chills.  Reviewing her records,  she has had 3 documented positive urine cultures for Escherichia coli over the last 2 years.  She does engage in good perineal hygiene. She does not take tub baths.   Her PVR is 94 mL.    Mixed urinary incontinence Myrbetriq was found to be cost prohibitive, so she was given a trial of oxybutynin 5 mg tid.  The patient has been experiencing urgency x 4-7 (stable), frequency x 8 or more (worse), is restricting fluids to avoid visits to the restroom, is engaging in toilet mapping, incontinence x 0-3 (stable) and nocturia x 4-7 (stable).   Her PVR is 16 mL.  She did not find the oxybutynin effective.  It is also  causing a very dry throat and hoarseness.    Vaginal atrophy She is applying the vaginal cream 3 nights weekly. She's not had vaginal irritation or rash.  PMH: Past Medical History:  Diagnosis Date  . Anginal pain ([place])    none in approx 2 yrs  . Anxiety   . Asthma   . Chronic lower back pain   . Common migraine with intractable migraine 09/20/2016  . Depression    suicidal ideation  . GERD (gastroesophageal reflux disease)   . Headache    migraines -2x/month  . Hyperlipidemia   . Hypertension   . Hypothyroidism   . Motion sickness    cars  . Shortness of breath dyspnea   . Sleep apnea     No machine  . Vertigo   . Wears dentures    partial upper    Surgical History: Past Surgical History:  Procedure Laterality Date  . ABDOMINAL HYSTERECTOMY    . bladder tach  1995  . CESAREAN SECTION    . COLONOSCOPY WITH PROPOFOL N/A 05/29/2015   Procedure: COLONOSCOPY WITH PROPOFOL;  Surgeon: Lucilla Lame, MD;  Location: [facility];  Service: Endoscopy;  Laterality: N/A;  Latex allergy sleep apnea - no CPAP machine (yet)  . POLYPECTOMY  05/29/2015   Procedure: POLYPECTOMY;  Surgeon: Lucilla Lame, MD;  Location: [facility];  Service: Endoscopy;;    Home Medications:  Allergies as of 03/17/2017      Reactions   Latex    Pt unsure of reaction pt states something happened while in the hospital after surgery.      Medication List       Accurate as of 03/17/17 10:31 AM. Always use your most recent med list.          ALPRAZolam 0.5 MG 24 hr tablet Commonly known as:  XANAX XR Take 1 tablet (0.5 mg total) by mouth daily.   aspirin 81 MG EC tablet Take 81 mg by mouth daily.   atorvastatin 40 MG tablet Commonly known as:  LIPITOR TAKE 1 TABLET BY MOUTH AT BEDTIME. GENERIC EQUIVALENT FOR LIPITOR   Azelastine HCl 0.15 % Soln Place 2 sprays into both nostrils 2 (two) times daily.   conjugated estrogens vaginal cream Commonly known as:  PREMARIN Place 1 Applicatorful vaginally daily. Apply 0.5mg  (pea-sized amount)  just inside the vaginal introitus with a finger-tip every night for two weeks and then Monday, Wednesday and Friday nights.   diclofenac 75 MG EC tablet Commonly known as:  VOLTAREN TAKE 1 TABLET BY MOUTH EVERY 12 HOURS AS NEEDED   DULoxetine 60 MG capsule Commonly known as:  CYMBALTA Take 1 capsule (60 mg total) by mouth daily.   EQ ALLERGY RELIEF 10 MG tablet Generic drug:  loratadine TAKE ONE TABLET BY MOUTH ONCE DAILY   fluticasone 50 MCG/ACT nasal spray Commonly known as:  FLONASE by Nasal route.   levothyroxine 88 MCG  tablet Commonly known as:  SYNTHROID, LEVOTHROID TAKE 1 TABLET BY MOUTH DAILY BEFORE BREAKFAST.STOP LEVOTHYROXINE 100 MCG. SANDOZ GENERIC ONLY   linaclotide 145 MCG Caps capsule Commonly known as:  LINZESS TAKE 1 CAPSULE BY MOUTH ONCE DAILY BEFORE BREAKFAST   metoprolol succinate 25 MG 24 hr tablet Commonly known as:  TOPROL XL Take 1 tablet (25 mg total) by mouth daily.   mirabegron ER 25 MG Tb24 tablet Commonly known as:  MYRBETRIQ Take 1 tablet (25 mg total) by mouth daily.   montelukast 10 MG tablet Commonly known as:  SINGULAIR Take 1 tablet (10 mg total) by mouth at bedtime.   nitroGLYCERIN 0.4 MG SL tablet Commonly known as:  NITROSTAT Place 1 tablet (0.4 mg total) under the tongue every 5 (five) minutes as needed.   nitroGLYCERIN 0.4 MG SL tablet Commonly known as:  NITROSTAT Place 1 tablet (0.4 mg total) under the tongue every 5 (five) minutes as needed.   olmesartan-hydrochlorothiazide 40-12.5 MG tablet Commonly known as:  BENICAR HCT Take 1 tablet by mouth daily.   omeprazole 40 MG capsule Commonly known as:  PRILOSEC Take 1 capsule (40 mg total) by mouth daily.   oxybutynin 5 MG tablet Commonly known as:  DITROPAN Take 1 tablet (5 mg total) by mouth 3 (three) times daily.   QUEtiapine 25 MG tablet Commonly known as:  SEROQUEL Take 1 tablet (25 mg total) by mouth at bedtime.   rizatriptan 10 MG tablet Commonly known as:  MAXALT Take 1 tablet (10 mg total) by mouth 3 (three) times daily as needed for migraine.   topiramate 25 MG tablet Commonly known as:  TOPAMAX Take 4 tablets (100 mg total) by mouth at bedtime.   traMADol 50 MG tablet Commonly known as:  ULTRAM Take by mouth.       Allergies:  Allergies  Allergen Reactions  . Latex     Pt unsure of reaction pt states something happened while in the hospital after surgery.    Family History: Family History  Problem Relation Age of Onset  . Diabetes Sister   . Hypertension Sister   .  Cirrhosis Mother   . Cirrhosis Father   . Breast cancer Sister 31  . Heart attack Brother   . Prostate cancer Neg Hx   . Kidney cancer Neg Hx   . Bladder Cancer Neg Hx     Social History:  reports that she has never smoked. She has never used smokeless tobacco. She reports that she does not drink alcohol or use drugs.  ROS: UROLOGY Frequent Urination?: Yes Hard to postpone urination?: No Burning/pain with urination?: No Get up at night to urinate?: No Leakage of urine?: No Urine stream starts and stops?: No Trouble starting stream?: No Do you have to strain to urinate?: No Blood in urine?: No Urinary tract infection?: No Sexually transmitted disease?: No Injury to kidneys or bladder?: No Painful intercourse?: No Weak stream?: No Currently pregnant?: No Vaginal bleeding?: No Last menstrual period?: n  Gastrointestinal Nausea?: No Vomiting?: No Indigestion/heartburn?: No Diarrhea?: No Constipation?: No  Constitutional Fever: No Night sweats?: No Weight loss?: No Fatigue?: No  Skin Skin rash/lesions?: No Itching?: No  Eyes Blurred vision?: No Double vision?: No  Ears/Nose/Throat Sore throat?: No Sinus problems?: No  Hematologic/Lymphatic Swollen glands?: No Easy bruising?: No  Cardiovascular Leg swelling?: No Chest pain?: No  Respiratory Cough?: No Shortness of breath?: Yes  Endocrine Excessive thirst?: No  Musculoskeletal Back pain?: Yes Joint pain?: Yes  Neurological Headaches?: Yes Dizziness?: Yes  Psychologic Depression?: Yes Anxiety?: Yes  Physical Exam: BP 125/80   Pulse 65   Ht 5\' 5"  (1.651 m)   Wt 244 lb (110.7 kg)   BMI 40.60 kg/m   Constitutional: Well nourished. Alert and oriented, No acute distress. HEENT: [place] AT, moist mucus membranes. Trachea midline, no masses. Cardiovascular: No clubbing, cyanosis, or edema. Respiratory: Normal respiratory effort, no increased work of breathing. Skin: No rashes, bruises or  suspicious lesions. Lymph: No cervical or inguinal adenopathy. Neurologic: Grossly intact, no focal deficits, moving all 4 extremities.  Psychiatric: Normal mood and affect.  Laboratory Data: Lab Results  Component Value Date   WBC 11.1 (H) 02/12/2017   HGB 12.4 02/12/2017   HCT 37.7 02/12/2017   MCV 84.3 02/12/2017   PLT 253 02/12/2017    Lab Results  Component Value Date   CREATININE 0.74 07/09/2016     Lab Results  Component Value Date   HGBA1C 6.1 (H) 02/12/2017    Lab Results  Component Value Date   TSH 2.21 02/12/2017       Component Value Date/Time   CHOL 176 02/12/2017 1059   CHOL 192 12/20/2015 1012   HDL 54 02/12/2017 1059   HDL 46 12/20/2015 1012   CHOLHDL 3.3 02/12/2017 1059   VLDL 44 (H) 02/12/2017 1059   LDLCALC 78 02/12/2017 1059   LDLCALC 103 (H) 12/20/2015 1012    Lab Results  Component Value Date   AST 15 07/09/2016   Lab Results  Component Value Date   ALT 11 07/09/2016   I have reviewed the labs  Pertinent Imaging: Results for STAYSHA, TRUBY (MRN [id number]) as of 03/17/2017 10:25  Ref. Range 03/17/2017 10:13  Scan Result Unknown 16    Assessment & Plan:    1. Mixed urinary Incontinence  - Patient is having good response with Myrbetriq 25 mg daily, but her insurance requires step therapy  - She did not find the oxybutynin effective in controlling her symptoms - will re prescribe the Myrbetriq 25 mg daily as that was effective   - Return to clinic in 12 month for OAB questionnaire and PVR                              2. Vaginal atrophy  - Continue Premarin cream 3 nights weekly  - A refill is sent to her pharmacy  - She'll return to clinic in 12 months for exam  Return in about 1 year (around 03/17/2018) for OAB questionnaire, PVR and exam.  These notes generated with voice recognition software. I apologize for typographical errors.  Zara Council, [facility] [address] [place], [place] [address] [phone]

## 2017-03-17 ENCOUNTER — Ambulatory Visit: Payer: BLUE CROSS/BLUE SHIELD | Admitting: Urology

## 2017-03-17 ENCOUNTER — Encounter: Payer: Self-pay | Admitting: Urology

## 2017-03-17 ENCOUNTER — Telehealth: Payer: Self-pay | Admitting: Urology

## 2017-03-17 VITALS — BP 125/80 | HR 65 | Ht 65.0 in | Wt 244.0 lb

## 2017-03-17 DIAGNOSIS — N3946 Mixed incontinence: Secondary | ICD-10-CM

## 2017-03-17 DIAGNOSIS — N952 Postmenopausal atrophic vaginitis: Secondary | ICD-10-CM | POA: Diagnosis not present

## 2017-03-17 LAB — BLADDER SCAN AMB NON-IMAGING: SCAN RESULT: 16

## 2017-03-17 MED ORDER — MIRABEGRON ER 25 MG PO TB24: 25.0000 mg | ORAL_TABLET | Freq: Every day | ORAL | 11 refills | Status: DC

## 2017-03-17 NOTE — Telephone Encounter (Signed)
Would you please follow up on her Myrbetriq 25 mg script?  Her insurance required step therapy and she failed oxybutynin.

## 2017-03-21 ENCOUNTER — Other Ambulatory Visit: Payer: Self-pay | Admitting: Family Medicine

## 2017-03-21 DIAGNOSIS — G8929 Other chronic pain: Secondary | ICD-10-CM

## 2017-03-21 DIAGNOSIS — M2550 Pain in unspecified joint: Principal | ICD-10-CM

## 2017-03-27 ENCOUNTER — Telehealth: Payer: Self-pay | Admitting: Adult Health

## 2017-03-27 ENCOUNTER — Encounter: Payer: Self-pay | Admitting: Adult Health

## 2017-03-27 ENCOUNTER — Other Ambulatory Visit: Payer: Self-pay | Admitting: Family Medicine

## 2017-03-27 ENCOUNTER — Ambulatory Visit (INDEPENDENT_AMBULATORY_CARE_PROVIDER_SITE_OTHER): Payer: BLUE CROSS/BLUE SHIELD | Admitting: Adult Health

## 2017-03-27 VITALS — BP 118/75 | HR 61 | Wt 246.4 lb

## 2017-03-27 DIAGNOSIS — G43019 Migraine without aura, intractable, without status migrainosus: Secondary | ICD-10-CM

## 2017-03-27 MED ORDER — GABAPENTIN 100 MG PO CAPS: 100.0000 mg | ORAL_CAPSULE | Freq: Every day | ORAL | 0 refills | Status: DC

## 2017-03-27 NOTE — Patient Instructions (Signed)
Your Plan:  MRI brain Consider gabapentin 100 mg at bedtime  Thank you for coming to see [place] at [facility]. I hope we have been able to provide you high quality care today.  You may receive a patient satisfaction survey over the next few weeks. We would appreciate your feedback and comments so that we may continue to improve ourselves and the health of our patients.  Gabapentin capsules or tablets What is this medicine? GABAPENTIN (GA ba pen tin) is used to control partial seizures in adults with epilepsy. It is also used to treat certain types of nerve pain. This medicine may be used for other purposes; ask your health care provider or pharmacist if you have questions. COMMON BRAND NAME(S): Active-PAC with Gabapentin, Gabarone, Neurontin What should I tell my health care provider before I take this medicine? They need to know if you have any of these conditions: -kidney disease -suicidal thoughts, plans, or attempt; a previous suicide attempt by you or a family member -an unusual or allergic reaction to gabapentin, other medicines, foods, dyes, or preservatives -pregnant or trying to get pregnant -breast-feeding How should I use this medicine? Take this medicine by mouth with a glass of water. Follow the directions on the prescription label. You can take it with or without food. If it upsets your stomach, take it with food.Take your medicine at regular intervals. Do not take it more often than directed. Do not stop taking except on your doctor's advice. If you are directed to break the 600 or 800 mg tablets in half as part of your dose, the extra half tablet should be used for the next dose. If you have not used the extra half tablet within 28 days, it should be thrown away. A special MedGuide will be given to you by the pharmacist with each prescription and refill. Be sure to read this information carefully each time. Talk to your pediatrician regarding the use of this  medicine in children. Special care may be needed. Overdosage: If you think you have taken too much of this medicine contact a poison control center or emergency room at once. NOTE: This medicine is only for you. Do not share this medicine with others. What if I miss a dose? If you miss a dose, take it as soon as you can. If it is almost time for your next dose, take only that dose. Do not take double or extra doses. What may interact with this medicine? Do not take this medicine with any of the following medications: -other gabapentin products This medicine may also interact with the following medications: -alcohol -antacids -antihistamines for allergy, cough and cold -certain medicines for anxiety or sleep -certain medicines for depression or psychotic disturbances -homatropine; hydrocodone -naproxen -narcotic medicines (opiates) for pain -phenothiazines like chlorpromazine, mesoridazine, prochlorperazine, thioridazine This list may not describe all possible interactions. Give your health care provider a list of all the medicines, herbs, non-prescription drugs, or dietary supplements you use. Also tell them if you smoke, drink alcohol, or use illegal drugs. Some items may interact with your medicine. What should I watch for while using this medicine? Visit your doctor or health care professional for regular checks on your progress. You may want to keep a record at home of how you feel your condition is responding to treatment. You may want to share this information with your doctor or health care professional at each visit. You should contact your doctor or health care professional if your seizures get  worse or if you have any new types of seizures. Do not stop taking this medicine or any of your seizure medicines unless instructed by your doctor or health care professional. Stopping your medicine suddenly can increase your seizures or their severity. Wear a medical identification bracelet or  chain if you are taking this medicine for seizures, and carry a card that lists all your medications. You may get drowsy, dizzy, or have blurred vision. Do not drive, use machinery, or do anything that needs mental alertness until you know how this medicine affects you. To reduce dizzy or fainting spells, do not sit or stand up quickly, especially if you are an older patient. Alcohol can increase drowsiness and dizziness. Avoid alcoholic drinks. Your mouth may get dry. Chewing sugarless gum or sucking hard candy, and drinking plenty of water will help. The use of this medicine may increase the chance of suicidal thoughts or actions. Pay special attention to how you are responding while on this medicine. Any worsening of mood, or thoughts of suicide or dying should be reported to your health care professional right away. Women who become pregnant while using this medicine may enroll in the [place] Pregnancy Registry by calling [phone]. This registry collects information about the safety of antiepileptic drug use during pregnancy. What side effects may I notice from receiving this medicine? Side effects that you should report to your doctor or health care professional as soon as possible: -allergic reactions like skin rash, itching or hives, swelling of the face, lips, or tongue -worsening of mood, thoughts or actions of suicide or dying Side effects that usually do not require medical attention (report to your doctor or health care professional if they continue or are bothersome): -constipation -difficulty walking or controlling muscle movements -dizziness -nausea -slurred speech -tiredness -tremors -weight gain This list may not describe all possible side effects. Call your doctor for medical advice about side effects. You may report side effects to FDA at 1-800-FDA-1088. Where should I keep my medicine? Keep out of reach of children. This medicine may cause  accidental overdose and death if it taken by other adults, children, or pets. Mix any unused medicine with a substance like cat litter or coffee grounds. Then throw the medicine away in a sealed container like a sealed bag or a coffee can with a lid. Do not use the medicine after the expiration date. Store at room temperature between 15 and 30 degrees C (59 and 86 degrees F). NOTE: This sheet is a summary. It may not cover all possible information. If you have questions about this medicine, talk to your doctor, pharmacist, or health care provider.  2018 Elsevier/Gold Standard (2013-09-10 15:26:50)

## 2017-03-27 NOTE — Telephone Encounter (Signed)
Pt called said she could take gabapentin per Dr Christeen Douglas.

## 2017-03-27 NOTE — Telephone Encounter (Signed)
Noted, Dr Ancil Boozer prescribed one month's supply of Gabapentin to patient today.

## 2017-03-27 NOTE — Telephone Encounter (Signed)
Noted  

## 2017-03-27 NOTE — Progress Notes (Signed)
PATIENT: Beverly Barrett DOB: August 28, 1960  REASON FOR VISIT: follow up HISTORY FROM: patient  HISTORY OF PRESENT ILLNESS: Today 03/27/17 Beverly Barrett is a 56 year old female with a history of migraine headaches. She returns today. She states that her primary care denial to switch her off of Seroquel which is understandable. Therefore she did not start nortriptyline. She states that she also stopped Topamax approximately one month. She has not noticed any change in her headache frequency. She states that she has about 3 headaches a week. They always occur on the left side. She states that she has a new finding when she slightly bend her head forward she will have a pain on the left side behind the ear she'll also experience a pulsing sensation. She also reports that she gets epidural steroid injections in the back and has noticed that when she receives injections she does have a headache several days afterwards.  HISTORY 12/18/16: Beverly Barrett is a 56 year old female with a history of migraine headaches. She returns today for follow-up. She was started on Topamax 75 mg at bedtime. She states that she does not feel that it's offered her much benefit. She continues to have frequent headaches. The headaches typically occur on the left side and in the back of the head. She does have photophobia and phonophobia as well as nausea with her headaches. She states that she can take the first dose of Maxalt and the headache may ease off slightly but typically comes back and she takes a second dose. The headache may resolve in an hour. Patient denies any additional neurologic symptoms. She is getting cortisone injections in the shoulders and back. She returns today for follow-up.  REVIEW OF SYSTEMS: Out of a complete 14 system review of symptoms, the patient complains only of the following symptoms, and all other reviewed systems are negative.  Ear pain, shortness of breath, abdominal pain, excessive thirst,  restless leg, apnea, frequency of urination, joint pain, back pain, walking difficulty, headache, depression, nervous/anxious  ALLERGIES: Allergies  Allergen Reactions  . Latex     Pt unsure of reaction pt states something happened while in the hospital after surgery.    HOME MEDICATIONS: Outpatient Medications Prior to Visit  Medication Sig Dispense Refill  . ALPRAZolam (XANAX XR) 0.5 MG 24 hr tablet Take 1 tablet (0.5 mg total) by mouth daily. 30 tablet 2  . aspirin 81 MG EC tablet Take 81 mg by mouth daily.      Marland Kitchen atorvastatin (LIPITOR) 40 MG tablet TAKE 1 TABLET BY MOUTH AT BEDTIME. GENERIC EQUIVALENT FOR LIPITOR 90 tablet 1  . Azelastine HCl 0.15 % SOLN Place 2 sprays into both nostrils 2 (two) times daily.   3  . conjugated estrogens (PREMARIN) vaginal cream Place 1 Applicatorful vaginally daily. Apply 0.5mg  (pea-sized amount)  just inside the vaginal introitus with a finger-tip every night for two weeks and then Monday, Wednesday and Friday nights. 30 g 12  . diclofenac (VOLTAREN) 75 MG EC tablet TAKE 1 TABLET BY MOUTH EVERY 12 HOURS AS NEEDED 180 tablet 0  . DULoxetine (CYMBALTA) 60 MG capsule Take 1 capsule (60 mg total) by mouth daily. 90 capsule 1  . EQ ALLERGY RELIEF 10 MG tablet TAKE ONE TABLET BY MOUTH ONCE DAILY 90 tablet 1  . fluticasone (FLONASE) 50 MCG/ACT nasal spray by Nasal route.    Marland Kitchen levothyroxine (SYNTHROID, LEVOTHROID) 88 MCG tablet TAKE 1 TABLET BY MOUTH DAILY BEFORE BREAKFAST.STOP LEVOTHYROXINE 100 MCG. SANDOZ GENERIC ONLY 90  tablet 0  . linaclotide (LINZESS) 145 MCG CAPS capsule TAKE 1 CAPSULE BY MOUTH ONCE DAILY BEFORE BREAKFAST 90 capsule 1  . metoprolol succinate (TOPROL XL) 25 MG 24 hr tablet Take 1 tablet (25 mg total) by mouth daily. 90 tablet 1  . mirabegron ER (MYRBETRIQ) 25 MG TB24 tablet Take 1 tablet (25 mg total) by mouth daily. 30 tablet 11  . montelukast (SINGULAIR) 10 MG tablet Take 1 tablet (10 mg total) by mouth at bedtime. 90 tablet 1  .  nitroGLYCERIN (NITROSTAT) 0.4 MG SL tablet Place 1 tablet (0.4 mg total) under the tongue every 5 (five) minutes as needed. 25 tablet 0  . olmesartan-hydrochlorothiazide (BENICAR HCT) 40-12.5 MG tablet Take 1 tablet by mouth daily. 90 tablet 1  . omeprazole (PRILOSEC) 40 MG capsule Take 1 capsule (40 mg total) by mouth daily. 90 capsule 1  . QUEtiapine (SEROQUEL) 25 MG tablet Take 1 tablet (25 mg total) by mouth at bedtime. 90 tablet 1  . traMADol (ULTRAM) 50 MG tablet Take by mouth.    . nitroGLYCERIN (NITROSTAT) 0.4 MG SL tablet Place 1 tablet (0.4 mg total) under the tongue every 5 (five) minutes as needed. 25 tablet 1  . oxybutynin (DITROPAN) 5 MG tablet Take 1 tablet (5 mg total) by mouth 3 (three) times daily. (Patient not taking: Reported on 03/27/2017) 90 tablet 0  . rizatriptan (MAXALT) 10 MG tablet Take 1 tablet (10 mg total) by mouth 3 (three) times daily as needed for migraine. (Patient not taking: Reported on 03/17/2017) 10 tablet 3  . topiramate (TOPAMAX) 25 MG tablet Take 4 tablets (100 mg total) by mouth at bedtime. (Patient not taking: Reported on 03/27/2017) 120 tablet 5   No facility-administered medications prior to visit.     PAST MEDICAL HISTORY: Past Medical History:  Diagnosis Date  . Anginal pain ([place])    none in approx 2 yrs  . Anxiety   . Asthma   . Chronic lower back pain   . Common migraine with intractable migraine 09/20/2016  . Depression    suicidal ideation  . GERD (gastroesophageal reflux disease)   . Headache    migraines -2x/month  . Hyperlipidemia   . Hypertension   . Hypothyroidism   . Motion sickness    cars  . Shortness of breath dyspnea   . Sleep apnea    No machine  . Vertigo   . Wears dentures    partial upper    PAST SURGICAL HISTORY: Past Surgical History:  Procedure Laterality Date  . ABDOMINAL HYSTERECTOMY    . bladder tach  1995  . CESAREAN SECTION    . COLONOSCOPY WITH PROPOFOL N/A 05/29/2015   Procedure: COLONOSCOPY WITH  PROPOFOL;  Surgeon: Lucilla Lame, MD;  Location: [facility];  Service: Endoscopy;  Laterality: N/A;  Latex allergy sleep apnea - no CPAP machine (yet)  . POLYPECTOMY  05/29/2015   Procedure: POLYPECTOMY;  Surgeon: Lucilla Lame, MD;  Location: [facility];  Service: Endoscopy;;    FAMILY HISTORY: Family History  Problem Relation Age of Onset  . Diabetes Sister   . Hypertension Sister   . Cirrhosis Mother   . Cirrhosis Father   . Breast cancer Sister 22  . Heart attack Brother   . Prostate cancer Neg Hx   . Kidney cancer Neg Hx   . Bladder Cancer Neg Hx     SOCIAL HISTORY: Social History   Social History  . Marital status: Married  Spouse name: N/A  . Number of children: N/A  . Years of education: N/A   Occupational History  . Not on file.   Social History Main Topics  . Smoking status: Never Smoker  . Smokeless tobacco: Never Used  . Alcohol use No  . Drug use: No  . Sexual activity: Yes    Partners: Male   Other Topics Concern  . Not on file   Social History Narrative   Lives with husband and one daughter other kids are out of the house   She does not work, husband on disability          PHYSICAL EXAM  Vitals:   03/27/17 0920  BP: 118/75  Pulse: 61  Weight: 246 lb 6.4 oz (111.8 kg)   Body mass index is 41 kg/m.  Generalized: Well developed, in no acute distress   Neurological examination  Mentation: Alert oriented to time, place, history taking. Follows all commands speech and language fluent Cranial nerve II-XII: Pupils were equal round reactive to light. Extraocular movements were full, visual field were full on confrontational test. Facial sensation and strength were normal. Uvula tongue midline. Head turning and shoulder shrug  were normal and symmetric. Motor: The motor testing reveals 5 over 5 strength of all 4 extremities. Good symmetric motor tone is noted throughout.  Sensory: Sensory testing is intact to soft touch on all  4 extremities. No evidence of extinction is noted.  Coordination: Cerebellar testing reveals good finger-nose-finger and heel-to-shin bilaterally.  Gait and station: Patient uses a cane when ambulating. Reflexes: Deep tendon reflexes are symmetric and normal bilaterally.   DIAGNOSTIC DATA (LABS, IMAGING, TESTING) - I reviewed patient records, labs, notes, testing and imaging myself where available.  Lab Results  Component Value Date   WBC 11.1 (H) 02/12/2017   HGB 12.4 02/12/2017   HCT 37.7 02/12/2017   MCV 84.3 02/12/2017   PLT 253 02/12/2017      Component Value Date/Time   NA 142 07/09/2016 1120   NA 144 12/20/2015 1012   K 3.3 (L) 07/09/2016 1120   CL 101 07/09/2016 1120   CO2 28 07/09/2016 1120   GLUCOSE 111 (H) 07/09/2016 1120   BUN 16 07/09/2016 1120   BUN 17 12/20/2015 1012   CREATININE 0.74 07/09/2016 1120   CALCIUM 9.0 07/09/2016 1120   PROT 6.9 07/09/2016 1120   PROT 7.2 12/20/2015 1012   ALBUMIN 4.2 07/09/2016 1120   ALBUMIN 4.3 12/20/2015 1012   AST 15 07/09/2016 1120   ALT 11 07/09/2016 1120   ALKPHOS 63 07/09/2016 1120   BILITOT 0.4 07/09/2016 1120   BILITOT 0.2 12/20/2015 1012   GFRNONAA >89 07/09/2016 1120   GFRAA >89 07/09/2016 1120   Lab Results  Component Value Date   CHOL 176 02/12/2017   HDL 54 02/12/2017   LDLCALC 78 02/12/2017   TRIG 222 (H) 02/12/2017   CHOLHDL 3.3 02/12/2017   Lab Results  Component Value Date   HGBA1C 6.1 (H) 02/12/2017   No results found for: [id number] Lab Results  Component Value Date   TSH 2.21 02/12/2017      ASSESSMENT AND PLAN 56 y.o. year old female  has a past medical history of Anginal pain ([place]); Anxiety; Asthma; Chronic lower back pain; Common migraine with intractable migraine (09/20/2016); Depression; GERD (gastroesophageal reflux disease); Headache; Hyperlipidemia; Hypertension; Hypothyroidism; Motion sickness; Shortness of breath dyspnea; Sleep apnea; Vertigo; and Wears dentures. here with:  1.  Migraine headache  The patient's headache frequency  has not improved despite trying Topamax. She is now developing new symptoms that she describes as a pulsating sensation when she bends her head slightly forward. The patient has never had an MRI of the brain. We will order an MRI of the brain to look for any acute changes that could be contributing to her headaches. I advised patient that we could try gabapentin 100 mg at bedtime. I advised that gabapentin and Seroquel were some central nervous system so this could potentially cause more drowsiness, confusion ataxia or possible respiratory depression. Although I do feel that this would be rare due to low dose. Patient states that she will consult with her primary care provider. In the meantime she is advised that if her headache worsens or she develops new symptoms she should  let us know. She will follow-up in 6 months or sooner if needed.    Ward Givens, MSN, NP-C 03/27/2017, 9:32 AM [facility] [address], [place], [place] [address] [phone]

## 2017-04-02 ENCOUNTER — Ambulatory Visit (INDEPENDENT_AMBULATORY_CARE_PROVIDER_SITE_OTHER): Payer: BLUE CROSS/BLUE SHIELD

## 2017-04-02 ENCOUNTER — Ambulatory Visit (INDEPENDENT_AMBULATORY_CARE_PROVIDER_SITE_OTHER): Payer: BLUE CROSS/BLUE SHIELD | Admitting: Gastroenterology

## 2017-04-02 VITALS — Ht 65.0 in | Wt 246.4 lb

## 2017-04-02 DIAGNOSIS — R1310 Dysphagia, unspecified: Secondary | ICD-10-CM | POA: Diagnosis not present

## 2017-04-02 DIAGNOSIS — G43019 Migraine without aura, intractable, without status migrainosus: Secondary | ICD-10-CM | POA: Diagnosis not present

## 2017-04-02 DIAGNOSIS — K219 Gastro-esophageal reflux disease without esophagitis: Secondary | ICD-10-CM | POA: Diagnosis not present

## 2017-04-02 MED ORDER — GADOPENTETATE DIMEGLUMINE 469.01 MG/ML IV SOLN: 20.0000 mL | Freq: Once | INTRAVENOUS | Status: DC | PRN

## 2017-04-02 NOTE — Progress Notes (Signed)
Cephas Darby, MD [address]  [place]  [place], [place] [address]  Main: [phone]  Fax: [phone]    Gastroenterology Consultation  Referring Provider:     Carloyn Manner, MD Primary Care Physician:  Steele Sizer, MD Primary Gastroenterologist:  Dr. Cephas Darby Reason for Consultation:     GERD        HPI:   Ashawna Hanback is a 56 y.o. y/o female referred by Dr. Steele Sizer, MD  for consultation & management of Chronic GERD. She has been having heartburn for a long time but got worse in last 1 month. Describes it as acid regurgitation in throat, associated with nausea sometimes vomiting when it's worse. Denies nocturnal symptoms. She has been taking Prilosec daily and keeps it under control. She denies dysphagia, hematemesis, melena. She does not smoke or drink alcohol. She does take BC powder for headache, few days a week. On linzess for chronic constipation, having 1 formed BM daily. She denies smoking or alcohol.  GI Procedures: 05/29/2015 by Dr. Allen Norris, found to have 2 polyps and recommended colonoscopy in 5 years  She denies family history of GI malignancy  Past Medical History:  Diagnosis Date  . Anginal pain ([place])    none in approx 2 yrs  . Anxiety   . Asthma   . Chronic lower back pain   . Common migraine with intractable migraine 09/20/2016  . Depression    suicidal ideation  . GERD (gastroesophageal reflux disease)   . Headache    migraines -2x/month  . Hyperlipidemia   . Hypertension   . Hypothyroidism   . Motion sickness    cars  . Shortness of breath dyspnea   . Sleep apnea    No machine  . Vertigo   . Wears dentures    partial upper    Past Surgical History:  Procedure Laterality Date  . ABDOMINAL HYSTERECTOMY    . bladder tach  1995  . CESAREAN SECTION    . COLONOSCOPY WITH PROPOFOL N/A 05/29/2015   Procedure: COLONOSCOPY WITH PROPOFOL;  Surgeon: Lucilla Lame, MD;  Location: [facility];  Service:  Endoscopy;  Laterality: N/A;  Latex allergy sleep apnea - no CPAP machine (yet)  . POLYPECTOMY  05/29/2015   Procedure: POLYPECTOMY;  Surgeon: Lucilla Lame, MD;  Location: [facility];  Service: Endoscopy;;    Prior to Admission medications   Medication Sig Start Date End Date Taking? Authorizing Provider  ALPRAZolam (XANAX XR) 0.5 MG 24 hr tablet Take 1 tablet (0.5 mg total) by mouth daily. 02/12/17  Yes Steele Sizer, MD  aspirin 81 MG EC tablet Take 81 mg by mouth daily.     Yes [provider]  atorvastatin (LIPITOR) 40 MG tablet TAKE 1 TABLET BY MOUTH AT BEDTIME. GENERIC EQUIVALENT FOR LIPITOR 12/08/16  Yes Sowles, Drue Stager, MD  Azelastine HCl 0.15 % SOLN Place 2 sprays into both nostrils 2 (two) times daily.  01/14/15  Yes [provider]  conjugated estrogens (PREMARIN) vaginal cream Place 1 Applicatorful vaginally daily. Apply 0.5mg  (pea-sized amount)  just inside the vaginal introitus with a finger-tip every night for two weeks and then Monday, Wednesday and Friday nights. 02/13/17  Yes McGowan, Larene Beach A, PA-C  diclofenac (VOLTAREN) 75 MG EC tablet TAKE 1 TABLET BY MOUTH EVERY 12 HOURS AS NEEDED 12/30/16  Yes Hubbard Hartshorn, FNP  DULoxetine (CYMBALTA) 60 MG capsule Take 1 capsule (60 mg total) by mouth daily. 02/12/17  Yes Sowles, Drue Stager,  MD  EQ ALLERGY RELIEF 10 MG tablet TAKE ONE TABLET BY MOUTH ONCE DAILY 07/25/15  Yes Bobetta Lime, MD  fluticasone (FLONASE) 50 MCG/ACT nasal spray by Nasal route. 04/11/14  Yes [provider]  gabapentin (NEURONTIN) 100 MG capsule Take 1 capsule (100 mg total) by mouth at bedtime. 03/27/17  Yes Sowles, Drue Stager, MD  levothyroxine (SYNTHROID, LEVOTHROID) 88 MCG tablet TAKE 1 TABLET BY MOUTH DAILY BEFORE BREAKFAST.STOP LEVOTHYROXINE 100 MCG. SANDOZ GENERIC ONLY 12/10/16  Yes Sowles, Drue Stager, MD  linaclotide [facility]) 145 MCG CAPS capsule TAKE 1 CAPSULE BY MOUTH ONCE DAILY BEFORE BREAKFAST 02/12/17  Yes Sowles, Drue Stager, MD    mirabegron ER (MYRBETRIQ) 25 MG TB24 tablet Take 1 tablet (25 mg total) by mouth daily. 03/17/17  Yes McGowan, Larene Beach A, PA-C  montelukast (SINGULAIR) 10 MG tablet Take 1 tablet (10 mg total) by mouth at bedtime. 02/12/17  Yes Sowles, Drue Stager, MD  nitroGLYCERIN (NITROSTAT) 0.4 MG SL tablet Place 1 tablet (0.4 mg total) under the tongue every 5 (five) minutes as needed. 02/20/17  Yes Gollan, Kathlene November, MD  olmesartan-hydrochlorothiazide (BENICAR HCT) 40-12.5 MG tablet Take 1 tablet by mouth daily. 02/12/17  Yes Sowles, Drue Stager, MD  omeprazole (PRILOSEC) 40 MG capsule Take 1 capsule (40 mg total) by mouth daily. 02/12/17  Yes Sowles, Drue Stager, MD  oxybutynin (DITROPAN) 5 MG tablet Take 1 tablet (5 mg total) by mouth 3 (three) times daily. 02/13/17  Yes McGowan, Larene Beach A, PA-C  QUEtiapine (SEROQUEL) 25 MG tablet Take 1 tablet (25 mg total) by mouth at bedtime. 02/12/17  Yes Sowles, Drue Stager, MD  rizatriptan (MAXALT) 10 MG tablet Take 1 tablet (10 mg total) by mouth 3 (three) times daily as needed for migraine. 09/20/16  Yes Kathrynn Ducking, MD  topiramate (TOPAMAX) 25 MG tablet Take 4 tablets (100 mg total) by mouth at bedtime. 12/18/16  Yes [place] Givens, NP  traMADol (ULTRAM) 50 MG tablet Take by mouth. 01/15/17  Yes Duanne Guess, PA-C    Family History  Problem Relation Age of Onset  . Diabetes Sister   . Hypertension Sister   . Cirrhosis Mother   . Cirrhosis Father   . Breast cancer Sister 93  . Heart attack Brother   . Prostate cancer Neg Hx   . Kidney cancer Neg Hx   . Bladder Cancer Neg Hx      Social History  Substance Use Topics  . Smoking status: Never Smoker  . Smokeless tobacco: Never Used  . Alcohol use No    Allergies as of 04/02/2017 - Review Complete 04/02/2017  Allergen Reaction Noted  . Latex  04/24/2015    Review of Systems:    All systems reviewed and negative except where noted in HPI.   Physical Exam:  Ht 5\' 5"  (1.651 m)   Wt 246 lb 6.4 oz (111.8 kg)    BMI 41.00 kg/m  No LMP recorded. Patient has had a hysterectomy.  General:   Alert,  Well-developed, well-nourished, pleasant and cooperative in NAD Head:  Normocephalic and atraumatic. Eyes:  Sclera clear, no icterus.   Conjunctiva pink. Nose:  No deformity, discharge, or lesions. Neck:  Supple; no masses or thyromegaly. Lungs:  Respirations even and unlabored.  Clear throughout to auscultation.   No wheezes, crackles, or rhonchi. No acute distress. Heart:  Regular rate and rhythm; no murmurs, clicks, rubs, or gallops. Abdomen:  Normal bowel sounds.  No bruits.  Soft, non-tender and non-distended without masses, hepatosplenomegaly or hernias noted.  No guarding  or rebound tenderness.   Rectal: Nor performed Msk:  Symmetrical without gross deformities. Pulses:  Normal pulses noted. Extremities:  No clubbing or edema.  No cyanosis. Neurologic:  Alert and oriented x3;  grossly normal neurologically. Skin:  Intact without significant lesions or rashes. No jaundice. Psych:  Alert and cooperative. Normal mood and affect.  Imaging Studies:   Assessment and Plan:   Jaidyn Usery is a 56 y.o.  female with Chronic acid reflux under control on daily PPI. She does not have any complications from acid reflux based on her history.  - Continue Prilosec daily - schedule EGD  Follow up in 3 months   Cephas Darby, MD

## 2017-04-03 ENCOUNTER — Telehealth: Payer: Self-pay | Admitting: Neurology

## 2017-04-03 NOTE — Telephone Encounter (Signed)
Called and spoke with patient to make her aware of the MRI results. Pt had no questions and verbalized understanding

## 2017-04-03 NOTE — Progress Notes (Signed)
I have reviewed and agreed above plan. 

## 2017-04-03 NOTE — Telephone Encounter (Signed)
-----   Message from [place] Givens, NP sent at 04/03/2017 10:58 AM EDT ----- MRI unremarkable. Please call patient.

## 2017-04-16 ENCOUNTER — Encounter: Payer: Self-pay | Admitting: *Deleted

## 2017-04-17 ENCOUNTER — Telehealth: Payer: Self-pay | Admitting: Urology

## 2017-04-17 NOTE — Telephone Encounter (Signed)
Pt calling re: her Gabapentin, pt states Beverly Barrett was supposed to call in a prescription of gabapentin.  Pt states she has not spoken with her pharmacy this morning but will check since she has not called them since yesterday.  Please call

## 2017-04-17 NOTE — Telephone Encounter (Signed)
Pt called office stating she is out of her samples of Myrbetriq and would like a Rx sent to her pharmacy please. [facility] on [address] Please advise. Thanks. Pt would like a phone call when this has been addressed. Thanks.

## 2017-04-18 NOTE — Telephone Encounter (Signed)
Pt was given more samples until able to hear back from insurance.

## 2017-04-21 MED ORDER — GABAPENTIN 100 MG PO CAPS: 100.0000 mg | ORAL_CAPSULE | Freq: Every day | ORAL | 5 refills | Status: DC

## 2017-04-21 NOTE — Telephone Encounter (Signed)
Called patient and informed her that NP refilled gabapentin to [facility]. Patient verbalized understanding, appreciation.

## 2017-04-21 NOTE — Addendum Note (Signed)
Addended by: Trudie Buckler on: 04/21/2017 01:19 PM   Modules accepted: Orders

## 2017-04-21 NOTE — Telephone Encounter (Signed)
Medication refilled

## 2017-04-22 NOTE — Discharge Instructions (Signed)
General Anesthesia, Adult, Care After °These instructions provide you with information about caring for yourself after your procedure. Your health care provider may also give you more specific instructions. Your treatment has been planned according to current medical practices, but problems sometimes occur. Call your health care provider if you have any problems or questions after your procedure. °What can I expect after the procedure? °After the procedure, it is common to have: °· Vomiting. °· A sore throat. °· Mental slowness. ° °It is common to feel: °· Nauseous. °· Cold or shivery. °· Sleepy. °· Tired. °· Sore or achy, even in parts of your body where you did not have surgery. ° °Follow these instructions at home: °For at least 24 hours after the procedure: °· Do not: °? Participate in activities where you could fall or become injured. °? Drive. °? Use heavy machinery. °? Drink alcohol. °? Take sleeping pills or medicines that cause drowsiness. °? Make important decisions or sign legal documents. °? Take care of children on your own. °· Rest. °Eating and drinking °· If you vomit, drink water, juice, or soup when you can drink without vomiting. °· Drink enough fluid to keep your urine clear or pale yellow. °· Make sure you have little or no nausea before eating solid foods. °· Follow the diet recommended by your health care provider. °General instructions °· Have a responsible adult stay with you until you are awake and alert. °· Return to your normal activities as told by your health care provider. Ask your health care provider what activities are safe for you. °· Take over-the-counter and prescription medicines only as told by your health care provider. °· If you smoke, do not smoke without supervision. °· Keep all follow-up visits as told by your health care provider. This is important. °Contact a health care provider if: °· You continue to have nausea or vomiting at home, and medicines are not helpful. °· You  cannot drink fluids or start eating again. °· You cannot urinate after 8-12 hours. °· You develop a skin rash. °· You have fever. °· You have increasing redness at the site of your procedure. °Get help right away if: °· You have difficulty breathing. °· You have chest pain. °· You have unexpected bleeding. °· You feel that you are having a life-threatening or urgent problem. °This information is not intended to replace advice given to you by your health care provider. Make sure you discuss any questions you have with your health care provider. °Document Released: 10/21/2000 Document Revised: 12/18/2015 Document Reviewed: 06/29/2015 °Elsevier Interactive Patient Education © 2018 [employer] ° °

## 2017-04-23 ENCOUNTER — Ambulatory Visit: Payer: BLUE CROSS/BLUE SHIELD | Admitting: Anesthesiology

## 2017-04-23 ENCOUNTER — Ambulatory Visit
Admission: RE | Admit: 2017-04-23 | Discharge: 2017-04-23 | Disposition: A | Discharge status: Home / Self Care | Payer: BLUE CROSS/BLUE SHIELD | Source: Ambulatory Visit | Attending: Gastroenterology | Admitting: Gastroenterology

## 2017-04-23 ENCOUNTER — Encounter
Admission: RE | Disposition: A | Discharge status: Home / Self Care | Payer: Self-pay | Source: Ambulatory Visit | Attending: Gastroenterology

## 2017-04-23 DIAGNOSIS — Z6841 Body Mass Index (BMI) 40.0 and over, adult: Secondary | ICD-10-CM | POA: Diagnosis not present

## 2017-04-23 DIAGNOSIS — E039 Hypothyroidism, unspecified: Secondary | ICD-10-CM | POA: Insufficient documentation

## 2017-04-23 DIAGNOSIS — Z793 Long term (current) use of hormonal contraceptives: Secondary | ICD-10-CM | POA: Insufficient documentation

## 2017-04-23 DIAGNOSIS — Z86010 Personal history of colonic polyps: Secondary | ICD-10-CM | POA: Diagnosis not present

## 2017-04-23 DIAGNOSIS — K317 Polyp of stomach and duodenum: Secondary | ICD-10-CM | POA: Insufficient documentation

## 2017-04-23 DIAGNOSIS — Z8379 Family history of other diseases of the digestive system: Secondary | ICD-10-CM | POA: Diagnosis not present

## 2017-04-23 DIAGNOSIS — J45909 Unspecified asthma, uncomplicated: Secondary | ICD-10-CM | POA: Insufficient documentation

## 2017-04-23 DIAGNOSIS — G4730 Sleep apnea, unspecified: Secondary | ICD-10-CM | POA: Insufficient documentation

## 2017-04-23 DIAGNOSIS — K219 Gastro-esophageal reflux disease without esophagitis: Secondary | ICD-10-CM | POA: Diagnosis not present

## 2017-04-23 DIAGNOSIS — Z803 Family history of malignant neoplasm of breast: Secondary | ICD-10-CM | POA: Insufficient documentation

## 2017-04-23 DIAGNOSIS — Z79899 Other long term (current) drug therapy: Secondary | ICD-10-CM | POA: Diagnosis not present

## 2017-04-23 DIAGNOSIS — K2950 Unspecified chronic gastritis without bleeding: Secondary | ICD-10-CM | POA: Diagnosis not present

## 2017-04-23 DIAGNOSIS — E785 Hyperlipidemia, unspecified: Secondary | ICD-10-CM | POA: Diagnosis not present

## 2017-04-23 DIAGNOSIS — Z833 Family history of diabetes mellitus: Secondary | ICD-10-CM | POA: Insufficient documentation

## 2017-04-23 DIAGNOSIS — Z91040 Latex allergy status: Secondary | ICD-10-CM | POA: Diagnosis not present

## 2017-04-23 DIAGNOSIS — Z90710 Acquired absence of both cervix and uterus: Secondary | ICD-10-CM | POA: Insufficient documentation

## 2017-04-23 DIAGNOSIS — Z791 Long term (current) use of non-steroidal anti-inflammatories (NSAID): Secondary | ICD-10-CM | POA: Diagnosis not present

## 2017-04-23 DIAGNOSIS — Z8249 Family history of ischemic heart disease and other diseases of the circulatory system: Secondary | ICD-10-CM | POA: Diagnosis not present

## 2017-04-23 DIAGNOSIS — F419 Anxiety disorder, unspecified: Secondary | ICD-10-CM | POA: Diagnosis not present

## 2017-04-23 DIAGNOSIS — I10 Essential (primary) hypertension: Secondary | ICD-10-CM | POA: Diagnosis not present

## 2017-04-23 DIAGNOSIS — Z7982 Long term (current) use of aspirin: Secondary | ICD-10-CM | POA: Insufficient documentation

## 2017-04-23 DIAGNOSIS — F329 Major depressive disorder, single episode, unspecified: Secondary | ICD-10-CM | POA: Diagnosis not present

## 2017-04-23 HISTORY — PX: ESOPHAGOGASTRODUODENOSCOPY: SHX5428

## 2017-04-23 HISTORY — DX: Dorsopathy, unspecified: M53.9

## 2017-04-23 SURGERY — EGD (ESOPHAGOGASTRODUODENOSCOPY)
Anesthesia: General | Wound class: Clean Contaminated

## 2017-04-23 MED ORDER — PROPOFOL 10 MG/ML IV BOLUS
INTRAVENOUS | Status: DC | PRN
  Administered 2017-04-23: 40 mg via INTRAVENOUS
  Administered 2017-04-23 (×3): 30 mg via INTRAVENOUS
  Administered 2017-04-23: 20 mg via INTRAVENOUS
  Administered 2017-04-23: 80 mg via INTRAVENOUS
  Administered 2017-04-23 (×2): 20 mg via INTRAVENOUS

## 2017-04-23 MED ORDER — GLYCOPYRROLATE 0.2 MG/ML IJ SOLN
INTRAMUSCULAR | Status: DC | PRN
  Administered 2017-04-23: 0.1 mg via INTRAVENOUS

## 2017-04-23 MED ORDER — LACTATED RINGERS IV SOLN
INTRAVENOUS | Status: DC | PRN
  Administered 2017-04-23: 10:00:00 via INTRAVENOUS

## 2017-04-23 MED ORDER — LIDOCAINE HCL (CARDIAC) 20 MG/ML IV SOLN
INTRAVENOUS | Status: DC | PRN
  Administered 2017-04-23: 40 mg via INTRAVENOUS

## 2017-04-23 MED ORDER — STERILE WATER FOR IRRIGATION IR SOLN
Status: DC | PRN
  Administered 2017-04-23: 10:00:00

## 2017-04-23 SURGICAL SUPPLY — 32 items
BALLN DILATOR 10-12 8 (BALLOONS)
BALLN DILATOR 12-15 8 (BALLOONS)
BALLN DILATOR 15-18 8 (BALLOONS)
BALLN DILATOR CRE 0-12 8 (BALLOONS)
BALLN DILATOR ESOPH 8 10 CRE (MISCELLANEOUS) IMPLANT
BALLOON DILATOR 12-15 8 (BALLOONS) IMPLANT
BALLOON DILATOR 15-18 8 (BALLOONS) IMPLANT
BALLOON DILATOR CRE 0-12 8 (BALLOONS) IMPLANT
BLOCK BITE 60FR ADLT L/F GRN (MISCELLANEOUS) ×2 IMPLANT
CANISTER SUCT 1200ML W/VALVE (MISCELLANEOUS) ×2 IMPLANT
CLIP HMST 235XBRD CATH ROT (MISCELLANEOUS) IMPLANT
CLIP RESOLUTION 360 11X235 (MISCELLANEOUS)
FCP ESCP3.2XJMB 240X2.8X (MISCELLANEOUS)
FORCEPS BIOP RAD 4 LRG CAP 4 (CUTTING FORCEPS) IMPLANT
FORCEPS BIOP RJ4 240 W/NDL (MISCELLANEOUS)
FORCEPS ESCP3.2XJMB 240X2.8X (MISCELLANEOUS) IMPLANT
GOWN CVR UNV OPN BCK APRN NK (MISCELLANEOUS) ×2 IMPLANT
GOWN ISOL THUMB LOOP REG UNIV (MISCELLANEOUS) ×4
INJECTOR VARIJECT VIN23 (MISCELLANEOUS) IMPLANT
KIT DEFENDO VALVE AND CONN (KITS) IMPLANT
KIT ENDO PROCEDURE OLY (KITS) ×2 IMPLANT
MARKER SPOT ENDO TATTOO 5ML (MISCELLANEOUS) IMPLANT
PAD GROUND ADULT SPLIT (MISCELLANEOUS) IMPLANT
RETRIEVER NET PLAT FOOD (MISCELLANEOUS) IMPLANT
SNARE SHORT THROW 13M SML OVAL (MISCELLANEOUS) ×1 IMPLANT
SNARE SHORT THROW 30M LRG OVAL (MISCELLANEOUS) IMPLANT
SPOT EX ENDOSCOPIC TATTOO (MISCELLANEOUS)
SYR INFLATION 60ML (SYRINGE) IMPLANT
TRAP ETRAP POLY (MISCELLANEOUS) ×1 IMPLANT
VARIJECT INJECTOR VIN23 (MISCELLANEOUS)
WATER STERILE IRR 250ML POUR (IV SOLUTION) ×2 IMPLANT
WIRE CRE 18-20MM 8CM F G (MISCELLANEOUS) IMPLANT

## 2017-04-23 NOTE — H&P (Signed)
Beverly Darby, MD [address]  [place]  [place], [place] [address]  Main: [phone]  Fax: [phone] Pager: [phone]  Primary Care Physician:  Steele Sizer, MD Primary Gastroenterologist:  Dr. Cephas Barrett  Pre-Procedure History & Physical: HPI:  Beverly Barrett is a 56 y.o. female is here for an endoscopy.   Past Medical History:  Diagnosis Date  . Anginal pain ([place])    none in approx 2 yrs  . Anxiety   . Asthma   . Chronic lower back pain   . Common migraine with intractable migraine 09/20/2016  . Depression    suicidal ideation  . GERD (gastroesophageal reflux disease)   . Headache    migraines -3x/wk  . Hyperlipidemia   . Hypertension   . Hypothyroidism   . Motion sickness    cars  . Multilevel degenerative disc disease   . Shortness of breath dyspnea   . Sleep apnea    CPAP  . Vertigo   . Wears dentures    partial upper    Past Surgical History:  Procedure Laterality Date  . ABDOMINAL HYSTERECTOMY    . bladder tach  1995  . CESAREAN SECTION    . COLONOSCOPY WITH PROPOFOL N/A 05/29/2015   Procedure: COLONOSCOPY WITH PROPOFOL;  Surgeon: Lucilla Lame, MD;  Location: [facility];  Service: Endoscopy;  Laterality: N/A;  Latex allergy sleep apnea - no CPAP machine (yet)  . POLYPECTOMY  05/29/2015   Procedure: POLYPECTOMY;  Surgeon: Lucilla Lame, MD;  Location: [facility];  Service: Endoscopy;;    Prior to Admission medications   Medication Sig Start Date End Date Taking? Authorizing Provider  ALPRAZolam (XANAX XR) 0.5 MG 24 hr tablet Take 1 tablet (0.5 mg total) by mouth daily. 02/12/17  Yes Steele Sizer, MD  aspirin 81 MG EC tablet Take 81 mg by mouth daily.     Yes [provider]  atorvastatin (LIPITOR) 40 MG tablet TAKE 1 TABLET BY MOUTH AT BEDTIME. GENERIC EQUIVALENT FOR LIPITOR 12/08/16  Yes Sowles, Drue Stager, MD  Azelastine HCl 0.15 % SOLN Place 2 sprays into both nostrils 2 (two) times daily.  01/14/15   Yes [provider]  conjugated estrogens (PREMARIN) vaginal cream Place 1 Applicatorful vaginally daily. Apply 0.5mg  (pea-sized amount)  just inside the vaginal introitus with a finger-tip every night for two weeks and then Monday, Wednesday and Friday nights. 02/13/17  Yes McGowan, Larene Beach A, PA-C  diclofenac (VOLTAREN) 75 MG EC tablet TAKE 1 TABLET BY MOUTH EVERY 12 HOURS AS NEEDED 12/30/16  Yes Hubbard Hartshorn, FNP  DULoxetine (CYMBALTA) 60 MG capsule Take 1 capsule (60 mg total) by mouth daily. 02/12/17  Yes Steele Sizer, MD  EQ ALLERGY RELIEF 10 MG tablet TAKE ONE TABLET BY MOUTH ONCE DAILY 07/25/15  Yes Bobetta Lime, MD  fluticasone (FLONASE) 50 MCG/ACT nasal spray by Nasal route. 04/11/14  Yes [provider]  levothyroxine (SYNTHROID, LEVOTHROID) 88 MCG tablet TAKE 1 TABLET BY MOUTH DAILY BEFORE BREAKFAST.STOP LEVOTHYROXINE 100 MCG. SANDOZ GENERIC ONLY 12/10/16  Yes Sowles, Drue Stager, MD  linaclotide [facility]) 145 MCG CAPS capsule TAKE 1 CAPSULE BY MOUTH ONCE DAILY BEFORE BREAKFAST 02/12/17  Yes Sowles, Drue Stager, MD  metoprolol succinate (TOPROL-XL) 25 MG 24 hr tablet TK 1 T PO D 02/16/17  Yes [provider]  mirabegron ER (MYRBETRIQ) 25 MG TB24 tablet Take 1 tablet (25 mg total) by mouth daily. 03/17/17  Yes McGowan, Shannon A, PA-C  montelukast (SINGULAIR) 10 MG tablet  Take 1 tablet (10 mg total) by mouth at bedtime. 02/12/17  Yes Sowles, Drue Stager, MD  nitroGLYCERIN (NITROSTAT) 0.4 MG SL tablet Place 1 tablet (0.4 mg total) under the tongue every 5 (five) minutes as needed. 02/20/17  Yes Gollan, Kathlene November, MD  olmesartan-hydrochlorothiazide (BENICAR HCT) 40-12.5 MG tablet Take 1 tablet by mouth daily. 02/12/17  Yes Sowles, Drue Stager, MD  omeprazole (PRILOSEC) 40 MG capsule Take 1 capsule (40 mg total) by mouth daily. 02/12/17  Yes Sowles, Drue Stager, MD  QUEtiapine (SEROQUEL) 25 MG tablet Take 1 tablet (25 mg total) by mouth at bedtime. 02/12/17  Yes Sowles, Drue Stager, MD    rizatriptan (MAXALT) 10 MG tablet Take 1 tablet (10 mg total) by mouth 3 (three) times daily as needed for migraine. 09/20/16  Yes Kathrynn Ducking, MD  traMADol Veatrice Bourbon) 50 MG tablet Take by mouth. 01/15/17  Yes Duanne Guess, PA-C  gabapentin (NEURONTIN) 100 MG capsule Take 1 capsule (100 mg total) by mouth at bedtime. 04/21/17   Ward Givens, NP  oxybutynin (DITROPAN) 5 MG tablet Take 1 tablet (5 mg total) by mouth 3 (three) times daily. Patient not taking: Reported on 04/16/2017 02/13/17   Zara Council A, PA-C  topiramate (TOPAMAX) 25 MG tablet Take 4 tablets (100 mg total) by mouth at bedtime. Patient not taking: Reported on 04/16/2017 12/18/16   Ward Givens, NP    Allergies as of 04/02/2017 - Review Complete 04/02/2017  Allergen Reaction Noted  . Latex  04/24/2015    Family History  Problem Relation Age of Onset  . Diabetes Sister   . Hypertension Sister   . Cirrhosis Mother   . Cirrhosis Father   . Breast cancer Sister 23  . Heart attack Brother   . Prostate cancer Neg Hx   . Kidney cancer Neg Hx   . Bladder Cancer Neg Hx     Social History   Social History  . Marital status: Married    Spouse name: N/A  . Number of children: N/A  . Years of education: N/A   Occupational History  . Not on file.   Social History Main Topics  . Smoking status: Never Smoker  . Smokeless tobacco: Never Used  . Alcohol use No  . Drug use: No  . Sexual activity: Yes    Partners: Male   Other Topics Concern  . Not on file   Social History Narrative   Lives with husband and one daughter other kids are out of the house   She does not work, husband on disability        Review of Systems: See HPI, otherwise negative ROS  Physical Exam: Ht 5\' 2"  (1.575 m)   Wt 242 lb (109.8 kg)   BMI 44.26 kg/m  General:   Alert,  pleasant and cooperative in NAD Head:  Normocephalic and atraumatic. Neck:  Supple; no masses or thyromegaly. Lungs:  Clear throughout to auscultation.     Heart:  Regular rate and rhythm. Abdomen:  Soft, nontender and nondistended. Normal bowel sounds, without guarding, and without rebound.   Neurologic:  Alert and  oriented x4;  grossly normal neurologically.  Impression/Plan: Beverly Barrett is here for an endoscopy to be performed for chronic GERD  Risks, benefits, limitations, and alternatives regarding  endoscopy have been reviewed with the patient.  Questions have been answered.  All parties agreeable.   Sherri Sear, MD  04/23/2017, 8:24 AM

## 2017-04-23 NOTE — Transfer of Care (Signed)
Immediate Anesthesia Transfer of Care Note  Patient: Beverly Barrett  Procedure(s) Performed: Procedure(s) with comments: ESOPHAGOGASTRODUODENOSCOPY (EGD) (N/A) - Latex allergy sleep apnea  Patient Location: PACU  Anesthesia Type: General  Level of Consciousness: awake, alert  and patient cooperative  Airway and Oxygen Therapy: Patient Spontanous Breathing and Patient connected to supplemental oxygen  Post-op Assessment: Post-op Vital signs reviewed, Patient's Cardiovascular Status Stable, Respiratory Function Stable, Patent Airway and No signs of Nausea or vomiting  Post-op Vital Signs: Reviewed and stable  Complications: No apparent anesthesia complications

## 2017-04-23 NOTE — Anesthesia Preprocedure Evaluation (Signed)
Anesthesia Evaluation  Patient identified by MRN, date of birth, ID band Patient awake    Reviewed: Allergy & Precautions, H&P , NPO status   History of Anesthesia Complications Negative for: history of anesthetic complications  Airway Mallampati: II  TM Distance: >3 FB Neck ROM: full    Dental  (+) Upper Dentures   Pulmonary asthma , sleep apnea and Continuous Positive Airway Pressure Ventilation ,    Pulmonary exam normal        Cardiovascular Exercise Tolerance: Good hypertension, On Medications (-) anginaNormal cardiovascular exam     Neuro/Psych  Headaches, PSYCHIATRIC DISORDERS Anxiety Depression    GI/Hepatic GERD  Controlled,  Endo/Other  Hypothyroidism Morbid obesity (bmi=45)  Renal/GU negative Renal ROS  negative genitourinary   Musculoskeletal  (+) Arthritis , LBP   Abdominal   Peds  Hematology negative hematology ROS (+)   Anesthesia Other Findings Echo: 2017: normal  Tiva.  Reproductive/Obstetrics                             Anesthesia Physical  Anesthesia Plan  ASA: III  Anesthesia Plan: General   Post-op Pain Management:    Induction:   PONV Risk Score and Plan:   Airway Management Planned:   Additional Equipment:   Intra-op Plan:   Post-operative Plan:   Informed Consent: I have reviewed the patients History and Physical, chart, labs and discussed the procedure including the risks, benefits and alternatives for the proposed anesthesia with the patient or authorized representative who has indicated his/her understanding and acceptance.     Plan Discussed with: CRNA  Anesthesia Plan Comments:         Anesthesia Quick Evaluation

## 2017-04-23 NOTE — Anesthesia Postprocedure Evaluation (Addendum)
Anesthesia Post Note  Patient: Beverly Barrett  Procedure(s) Performed: Procedure(s) (LRB): ESOPHAGOGASTRODUODENOSCOPY (EGD) (N/A)  Patient location during evaluation: PACU Anesthesia Type: General Level of consciousness: awake and alert Pain management: pain level controlled Vital Signs Assessment: post-procedure vital signs reviewed and stable Respiratory status: spontaneous breathing, nonlabored ventilation, respiratory function stable and patient connected to nasal cannula oxygen Cardiovascular status: blood pressure returned to baseline and stable Postop Assessment: no apparent nausea or vomiting Anesthetic complications: no    Laiyla Slagel

## 2017-04-23 NOTE — Anesthesia Procedure Notes (Signed)
Procedure Name: MAC Date/Time: 04/23/2017 9:36 AM Performed by: Janna Arch Pre-anesthesia Checklist: Emergency Drugs available, Suction available, Patient identified and Patient being monitored Patient Re-evaluated:Patient Re-evaluated prior to induction Oxygen Delivery Method: Nasal cannula

## 2017-04-23 NOTE — Op Note (Signed)
[facility] Gastroenterology Patient Name: Beverly Barrett Procedure Date: 04/23/2017 9:34 AM MRN: [id number] Account #: [id number] Date of Birth: 1961-02-27 Admit Type: Outpatient Age: 56 Room: [facility] OR ROOM 01 Gender: Female Note Status: Finalized Procedure:            Upper GI endoscopy Indications:          Heartburn Providers:            Lin Landsman MD, MD Referring MD:         Bethena Roys. Sowles, MD (Referring MD) Medicines:            Monitored Anesthesia Care Complications:        No immediate complications. Estimated blood loss:                        Minimal. Procedure:            Pre-Anesthesia Assessment:                       - Prior to the procedure, a History and Physical was                        performed, and patient medications and allergies were                        reviewed. The patient is competent. The risks and                        benefits of the procedure and the sedation options and                        risks were discussed with the patient. All questions                        were answered and informed consent was obtained.                        Patient identification and proposed procedure were                        verified by the physician, the nurse, the                        anesthesiologist, the anesthetist and the technician in                        the pre-procedure area in the procedure room. Mental                        Status Examination: alert and oriented. Airway                        Examination: normal oropharyngeal airway and neck                        mobility. Respiratory Examination: clear to                        auscultation. CV Examination: normal. Prophylactic  Antibiotics: The patient does not require prophylactic                        antibiotics. Prior Anticoagulants: The patient has                        taken aspirin, last dose was 1 day prior to procedure.                    ASA Grade Assessment: III - A patient with severe                        systemic disease. After reviewing the risks and                        benefits, the patient was deemed in satisfactory                        condition to undergo the procedure. The anesthesia plan                        was to use monitored anesthesia care (MAC). Immediately                        prior to administration of medications, the patient was                        re-assessed for adequacy to receive sedatives. The                        heart rate, respiratory rate, oxygen saturations, blood                        pressure, adequacy of pulmonary ventilation, and                        response to care were monitored throughout the                        procedure. The physical status of the patient was                        re-assessed after the procedure.                       After obtaining informed consent, the endoscope was                        passed under direct vision. Throughout the procedure,                        the patient's blood pressure, pulse, and oxygen                        saturations were monitored continuously. The Olympus                        GIF H180J Endoscope (S#: [id number]) was introduced                        through the  mouth, and advanced to the second part of                        duodenum. The upper GI endoscopy was accomplished                        without difficulty. The patient tolerated the procedure                        well. Findings:      The duodenal bulb and second portion of the duodenum were normal.      A single 5 mm sessile polyp with no bleeding and no stigmata of recent       bleeding was found in the gastric antrum. The polyp was removed with a       cold snare. Resection and retrieval were complete.      Diffuse mildly erythematous mucosa without bleeding was found in the       gastric body, a clear transition zone is identified  between antrum and       body. Random gastric Biopsies were taken with a cold forceps for       Helicobacter pylori testing.      The gastroesophageal junction and examined esophagus were normal. Impression:           - Normal duodenal bulb and second portion of the                        duodenum.                       - A single gastric polyp. Resected and retrieved.                       - Erythematous mucosa in the gastric body. Biopsied.                       - Normal gastroesophageal junction and esophagus. Recommendation:       - Await pathology results.                       - Discharge patient to home.                       - Resume previous diet today.                       - Continue present medications.                       - Return to GI clinic as previously scheduled. Procedure Code(s):    --- Professional ---                       [phone], Esophagogastroduodenoscopy, flexible, transoral;                        with removal of tumor(s), polyp(s), or other lesion(s)                        by snare technique                       [id number], 4,  Esophagogastroduodenoscopy, flexible,                        transoral; with biopsy, single or multiple Diagnosis Code(s):    --- Professional ---                       K31.7, Polyp of stomach and duodenum                       K31.89, Other diseases of stomach and duodenum                       R12, Heartburn CPT copyright 2016 [employer]. All rights reserved. The codes documented in this report are preliminary and upon coder review may  be revised to meet current compliance requirements. Dr. Ulyess Mort Lin Landsman MD, MD 04/23/2017 9:54:35 AM This report has been signed electronically. Number of Addenda: 0 Note Initiated On: 04/23/2017 9:34 AM      [facility]

## 2017-04-24 ENCOUNTER — Encounter: Payer: Self-pay | Admitting: Gastroenterology

## 2017-04-29 ENCOUNTER — Encounter: Payer: Self-pay | Admitting: Gastroenterology

## 2017-05-07 ENCOUNTER — Other Ambulatory Visit: Payer: Self-pay | Admitting: *Deleted

## 2017-05-07 MED ORDER — GABAPENTIN 100 MG PO CAPS: 100.0000 mg | ORAL_CAPSULE | Freq: Every day | ORAL | 1 refills | Status: DC

## 2017-05-07 NOTE — Telephone Encounter (Signed)
Spoke to [employer] with [facility] [id number] ok for gabapentin 90day supply with one refill.  (take 100mg  po qhs)

## 2017-05-13 ENCOUNTER — Other Ambulatory Visit: Payer: Self-pay | Admitting: Family Medicine

## 2017-05-13 DIAGNOSIS — E034 Atrophy of thyroid (acquired): Secondary | ICD-10-CM

## 2017-05-13 NOTE — Telephone Encounter (Signed)
Refill request for thyroid medication.  Last Physical:  Lab Results  Component Value Date   TSH 2.21 02/12/2017     Last visit was 02/12/2017.

## 2017-05-16 ENCOUNTER — Encounter: Payer: Self-pay | Admitting: Family Medicine

## 2017-05-16 ENCOUNTER — Ambulatory Visit (INDEPENDENT_AMBULATORY_CARE_PROVIDER_SITE_OTHER): Payer: BLUE CROSS/BLUE SHIELD | Admitting: Family Medicine

## 2017-05-16 DIAGNOSIS — E785 Hyperlipidemia, unspecified: Secondary | ICD-10-CM

## 2017-05-16 DIAGNOSIS — F411 Generalized anxiety disorder: Secondary | ICD-10-CM | POA: Diagnosis not present

## 2017-05-16 DIAGNOSIS — Z23 Encounter for immunization: Secondary | ICD-10-CM

## 2017-05-16 DIAGNOSIS — K219 Gastro-esophageal reflux disease without esophagitis: Secondary | ICD-10-CM

## 2017-05-16 DIAGNOSIS — G4700 Insomnia, unspecified: Secondary | ICD-10-CM

## 2017-05-16 DIAGNOSIS — I10 Essential (primary) hypertension: Secondary | ICD-10-CM

## 2017-05-16 DIAGNOSIS — J4520 Mild intermittent asthma, uncomplicated: Secondary | ICD-10-CM | POA: Diagnosis not present

## 2017-05-16 DIAGNOSIS — E034 Atrophy of thyroid (acquired): Secondary | ICD-10-CM | POA: Diagnosis not present

## 2017-05-16 DIAGNOSIS — F331 Major depressive disorder, recurrent, moderate: Secondary | ICD-10-CM | POA: Diagnosis not present

## 2017-05-16 DIAGNOSIS — M2550 Pain in unspecified joint: Secondary | ICD-10-CM

## 2017-05-16 DIAGNOSIS — K641 Second degree hemorrhoids: Secondary | ICD-10-CM | POA: Insufficient documentation

## 2017-05-16 DIAGNOSIS — K5909 Other constipation: Secondary | ICD-10-CM | POA: Diagnosis not present

## 2017-05-16 DIAGNOSIS — G8929 Other chronic pain: Secondary | ICD-10-CM

## 2017-05-16 MED ORDER — HYDROCORTISONE ACETATE 25 MG RE SUPP: 25.0000 mg | Freq: Two times a day (BID) | RECTAL | 0 refills | Status: DC

## 2017-05-16 MED ORDER — LINACLOTIDE 145 MCG PO CAPS: ORAL_CAPSULE | ORAL | 1 refills | Status: DC

## 2017-05-16 MED ORDER — QUETIAPINE FUMARATE 25 MG PO TABS: 25.0000 mg | ORAL_TABLET | Freq: Every day | ORAL | 1 refills | Status: DC

## 2017-05-16 MED ORDER — DULOXETINE HCL 60 MG PO CPEP: 60.0000 mg | ORAL_CAPSULE | Freq: Every day | ORAL | 1 refills | Status: DC

## 2017-05-16 MED ORDER — LEVOTHYROXINE SODIUM 88 MCG PO TABS: ORAL_TABLET | ORAL | 1 refills | Status: DC

## 2017-05-16 MED ORDER — OMEPRAZOLE 40 MG PO CPDR: 40.0000 mg | DELAYED_RELEASE_CAPSULE | Freq: Every day | ORAL | 1 refills | Status: DC

## 2017-05-16 MED ORDER — METOPROLOL SUCCINATE ER 25 MG PO TB24: ORAL_TABLET | ORAL | 1 refills | Status: DC

## 2017-05-16 MED ORDER — MONTELUKAST SODIUM 10 MG PO TABS: 10.0000 mg | ORAL_TABLET | Freq: Every day | ORAL | 1 refills | Status: DC

## 2017-05-16 MED ORDER — ATORVASTATIN CALCIUM 40 MG PO TABS: ORAL_TABLET | ORAL | 1 refills | Status: DC

## 2017-05-16 MED ORDER — OLMESARTAN MEDOXOMIL-HCTZ 40-12.5 MG PO TABS: 1.0000 | ORAL_TABLET | Freq: Every day | ORAL | 1 refills | Status: DC

## 2017-05-16 MED ORDER — ALPRAZOLAM ER 0.5 MG PO TB24: 0.5000 mg | ORAL_TABLET | Freq: Every day | ORAL | 2 refills | Status: DC

## 2017-05-16 NOTE — Progress Notes (Signed)
Name: Beverly Barrett   MRN: [id number]    DOB: March 28, 1961   Date:05/16/2017       Progress Note  Subjective  Chief Complaint  Chief Complaint  Patient presents with  . Depression  . Hyperlipidemia  . Hypothyroidism    HPI  Major Depression/GAD: She has a long history of depression and was started on Paroxetine 40 mg in the morning and Depakote 250 mg DR one tablet in the evenings, however since Depakote was not helping we tried Buspar ( May 2017 ) but again no improvement of symptoms. .We added Alprazolam XR 0.5 mg July 2017 and she has noticed some improvement in the anxiety level, but still has severe symptoms, we added Cymbalta 02/2016 and she was doing better, however feeling worse, feels like she is burden to her family.She still has occasionally has suicidal ideation but no planning ( she states she would not do it because of her grandson ) - she feels guilty about not contributing financially to her family and also because her health care is so expensive, so she feels like her family would be better off without her. She complains of depressed mood, fatigue, feelings of worthlessness/guilt, and insomnia ( that is partially controlled with Seroquel) . Family history significant for anxiety and depression, one of her brother's attempted suicide many years ago. She has severe anxiety, difficulty interacting with others, she picks on her nails and makes herself bleed at times, constantly shaking her legs, restless, always worried but no longer snappy. She has an appointment scheduled with psychiatrist  HTN: she taking her medication Benicar HCTZ and started on Toprol XL , July 2017. She has noticed that fluttering sensation has improved with beta-blockers,  Angina: had evaluation by cardiologist and Echo myoview in July normal result. She states chest pain still happens and at times during rest. She is on statin, aspirin, beta-blocker , ARB and has been taking NTG intermittently,  but did not have to take any NTG since last visit. Pain is described as throbbing sensation on left side of chest and sometimes radiates to her left shoulder and back. No recent episodes  Constipation: she was started on Linzess July 2017 and states has bowel movements daily , no straining or blood in stools. No side effects of medications   OSA: using CPAP every night, all night, and we started her on Seroquel  It still takes up to  2 hours to fall asleep but is able to stay asleep for about 7 hours.   Obesity: weight is stable, discussed life style modification again   GERD:Seen by Dr. Marius Ditch and has gastritis, advised to stop goody powders and she has been following the recommendation. Waking up with nausea, but improves once she gest up and takes Omeprazole and Dramamine . Advised to try Omeprazole prior to dinner to see if symptoms improves  Dyslipidemia: labs have improved, continue medications. No chest pain.   Hyperglycemia: last hgbA1C was 6.1% down from 6.2%, she always has polydipsia, but unchanged.   Migraine: seen at the headache center by Dr. Jannifer Franklin, started on Topamax and Maxalt recently, still having headaches at this time, she was recently advised to try stopping Duloxetine and Seroquel and start Nortriptyline but had to discuss it with me first, advised not to switch until seen by psychiatrist to manage her depression She has an appointment coming up with Psychiatrist and will discuss it with her.   Urinary urgency and incontinence: symptoms of low back and supra pubic pain again,  urgency, nocturia, and sometimes she cannot make it to the bathroom. Seen by Urologist PA and is on Myrbetric and also premarin for vaginal atrophy, she has notice mild improvement of symptoms while on medication, getting samples now since insurance denied coveraged.   Hypothyroidism she is currently taking medication daily, she has chronic dry skin and constipation ( resolved with Linzes),  last TSH is back to normal,  on Synthroid 88 mcg   DDD lumbar spine: had MRI done, ordered by Ortho and was found to have DDD lumbar spine, seeing Dr. Sharlet Salina and had steroid injection with mild improvement of symptoms, states not currently having pain radiating to lower extremities. Recently had injection on left hip  Hemorrhoids: she has along history of hemorrhoids, this time around has been been bothering her for the past month, seen by Dr. Marius Ditch and had EGD recently but did not talk to her about it. Previous colonoscopy 2016 and showed grade II hemorrhoids, negative hemoccult cards times 3 back 10/2016.   Patient Active Problem List   Diagnosis Date Noted  . Grade II internal hemorrhoids 05/16/2017  . Mixed stress and urge urinary incontinence 11/12/2016  . Degenerative arthritis of lumbar spine 11/12/2016  . Common migraine with intractable migraine 09/20/2016  . Iron deficiency anemia 09/12/2016  . GAD (generalized anxiety disorder) 12/20/2015  . OSA on CPAP 12/20/2015  . Angina effort ([place]) 12/20/2015  . Hyperglycemia 09/22/2015  . History of colonic polyps   . Benign neoplasm of sigmoid colon   . Frequency 04/14/2015  . Backache 04/14/2015  . Chronic pain of multiple joints 06/14/2014  . Dysmetabolic syndrome 09/32/3557  . Acid reflux 06/14/2014  . Extreme obesity 06/14/2014  . Arthritis, degenerative 06/14/2014  . Morbid obesity with BMI of 40.0-44.9, adult ([place]) 06/14/2014  . Hypothyroidism due to acquired atrophy of thyroid 08/18/2013  . Hypothyroidism, unspecified 08/18/2013  . Hyperlipidemia 12/07/2010  . Hypertension goal BP (blood pressure) < 140/90 12/07/2010  . Familial multiple lipoprotein-type hyperlipidemia 12/07/2010  . CN (constipation) 11/21/2009  . Allergic rhinitis 10/13/2008  . Asthma, mild intermittent, well-controlled 09/01/2008  . Major depressive disorder, recurrent episode, in partial remission with anxious distress ([place]) 08/13/2007    Past  Surgical History:  Procedure Laterality Date  . ABDOMINAL HYSTERECTOMY    . bladder tach  1995  . CESAREAN SECTION    . COLONOSCOPY WITH PROPOFOL N/A 05/29/2015   Procedure: COLONOSCOPY WITH PROPOFOL;  Surgeon: Lucilla Lame, MD;  Location: [facility];  Service: Endoscopy;  Laterality: N/A;  Latex allergy sleep apnea - no CPAP machine (yet)  . ESOPHAGOGASTRODUODENOSCOPY N/A 04/23/2017   Procedure: ESOPHAGOGASTRODUODENOSCOPY (EGD);  Surgeon: Lin Landsman, MD;  Location: [facility];  Service: Gastroenterology;  Laterality: N/A;  Latex allergy sleep apnea  . POLYPECTOMY  05/29/2015   Procedure: POLYPECTOMY;  Surgeon: Lucilla Lame, MD;  Location: [facility];  Service: Endoscopy;;    Family History  Problem Relation Age of Onset  . Diabetes Sister   . Hypertension Sister   . Cirrhosis Mother   . Cirrhosis Father   . Breast cancer Sister 62  . Heart attack Brother   . Prostate cancer Neg Hx   . Kidney cancer Neg Hx   . Bladder Cancer Neg Hx     Social History   Social History  . Marital status: Married    Spouse name: N/A  . Number of children: N/A  . Years of education: N/A   Occupational History  . Not on file.  Social History Main Topics  . Smoking status: Never Smoker  . Smokeless tobacco: Never Used  . Alcohol use No  . Drug use: No  . Sexual activity: Yes    Partners: Male   Other Topics Concern  . Not on file   Social History Narrative   Lives with husband and one daughter other kids are out of the house   She does not work, husband on disability         Current Outpatient Prescriptions:  .  ALPRAZolam (XANAX XR) 0.5 MG 24 hr tablet, Take 1 tablet (0.5 mg total) by mouth daily., Disp: 30 tablet, Rfl: 2 .  aspirin 81 MG EC tablet, Take 81 mg by mouth daily.  , Disp: , Rfl:  .  Azelastine HCl 0.15 % SOLN, Place 2 sprays into both nostrils 2 (two) times daily. , Disp: , Rfl: 3 .  conjugated estrogens (PREMARIN) vaginal cream,  Place 1 Applicatorful vaginally daily. Apply 0.5mg  (pea-sized amount)  just inside the vaginal introitus with a finger-tip every night for two weeks and then Monday, Wednesday and Friday nights., Disp: 30 g, Rfl: 12 .  cyclobenzaprine (FLEXERIL) 5 MG tablet, , Disp: , Rfl: 0 .  diclofenac (VOLTAREN) 75 MG EC tablet, TAKE 1 TABLET BY MOUTH EVERY 12 HOURS AS NEEDED, Disp: 180 tablet, Rfl: 0 .  EQ ALLERGY RELIEF 10 MG tablet, TAKE ONE TABLET BY MOUTH ONCE DAILY, Disp: 90 tablet, Rfl: 1 .  fluticasone (FLONASE) 50 MCG/ACT nasal spray, by Nasal route., Disp: , Rfl:  .  gabapentin (NEURONTIN) 100 MG capsule, Take 1 capsule (100 mg total) by mouth at bedtime., Disp: 90 capsule, Rfl: 1 .  mirabegron ER (MYRBETRIQ) 25 MG TB24 tablet, Take 1 tablet (25 mg total) by mouth daily., Disp: 30 tablet, Rfl: 11 .  nitroGLYCERIN (NITROSTAT) 0.4 MG SL tablet, Place 1 tablet (0.4 mg total) under the tongue every 5 (five) minutes as needed., Disp: 25 tablet, Rfl: 0 .  traMADol (ULTRAM) 50 MG tablet, Take by mouth., Disp: , Rfl:  .  atorvastatin (LIPITOR) 40 MG tablet, TAKE 1 TABLET BY MOUTH AT BEDTIME. GENERIC EQUIVALENT FOR LIPITOR, Disp: 90 tablet, Rfl: 1 .  DULoxetine (CYMBALTA) 60 MG capsule, Take 1 capsule (60 mg total) by mouth daily., Disp: 90 capsule, Rfl: 1 .  hydrocortisone (ANUSOL-HC) 25 MG suppository, Place 1 suppository (25 mg total) rectally 2 (two) times daily., Disp: 12 suppository, Rfl: 0 .  levothyroxine (SYNTHROID, LEVOTHROID) 88 MCG tablet, TAKE 1 TABLET BY MOUTH DAILY BEFORE BREAKFAST.STOP LEVOTHYROXINE 100 MCG. SANDOZ GENERIC ONLY, Disp: 90 tablet, Rfl: 1 .  linaclotide (LINZESS) 145 MCG CAPS capsule, TAKE 1 CAPSULE BY MOUTH ONCE DAILY BEFORE BREAKFAST, Disp: 90 capsule, Rfl: 1 .  metoprolol succinate (TOPROL-XL) 25 MG 24 hr tablet, TK 1 T PO D, Disp: 90 tablet, Rfl: 1 .  montelukast (SINGULAIR) 10 MG tablet, Take 1 tablet (10 mg total) by mouth at bedtime., Disp: 90 tablet, Rfl: 1 .   olmesartan-hydrochlorothiazide (BENICAR HCT) 40-12.5 MG tablet, Take 1 tablet by mouth daily., Disp: 90 tablet, Rfl: 1 .  omeprazole (PRILOSEC) 40 MG capsule, Take 1 capsule (40 mg total) by mouth daily., Disp: 90 capsule, Rfl: 1 .  oxybutynin (DITROPAN) 5 MG tablet, Take 1 tablet (5 mg total) by mouth 3 (three) times daily. (Patient not taking: Reported on 04/16/2017), Disp: 90 tablet, Rfl: 0 .  QUEtiapine (SEROQUEL) 25 MG tablet, Take 1 tablet (25 mg total) by mouth at bedtime., Disp: 90  tablet, Rfl: 1 .  rizatriptan (MAXALT) 10 MG tablet, Take 1 tablet (10 mg total) by mouth 3 (three) times daily as needed for migraine. (Patient not taking: Reported on 05/16/2017), Disp: 10 tablet, Rfl: 3 .  topiramate (TOPAMAX) 25 MG tablet, Take 4 tablets (100 mg total) by mouth at bedtime. (Patient not taking: Reported on 04/16/2017), Disp: 120 tablet, Rfl: 5 No current facility-administered medications for this visit.   Facility-Administered Medications Ordered in Other Visits:  .  gadopentetate dimeglumine (MAGNEVIST) injection 20 mL, 20 mL, Intravenous, Once PRN, Ward Givens, NP  Allergies  Allergen Reactions  . Latex     Pt unsure of reaction pt states something happened while in the hospital after surgery.     ROS  Constitutional: Negative for fever , positive for  weight change.  Respiratory: Negative for cough and shortness of breath.   Cardiovascular: Negative for chest pain or palpitations.  Gastrointestinal: Negative for abdominal pain, no bowel changes. Positive for nausea in am's improves with omeprazole. Advised to take OMeprazole before dinner instead of in am's Musculoskeletal: Poistive  for gait problem or joint swelling.  Skin: Negative for rash.  Neurological: Negative for dizziness or headache.  No other specific complaints in a complete review of systems (except as listed in HPI above).  Objective  Vitals:   05/16/17 0912  BP: 100/70  Pulse: 73  Resp: 14  SpO2: 96%   Weight: 251 lb 1.6 oz (113.9 kg)  Height: 5\' 5"  (1.651 m)    Body mass index is 41.79 kg/m.  Physical Exam  Constitutional: Patient appears well-developed and well-nourished. Obese  No distress.  HEENT: head atraumatic, normocephalic, pupils equal and reactive to light, neck supple, throat within normal limits Cardiovascular: Normal rate, regular rhythm and normal heart sounds.  No murmur heard. No BLE edema. Pulmonary/Chest: Effort normal and breath sounds normal. No respiratory distress. Abdominal: Soft.  There is no tenderness. Psychiatric: Patient has a normal mood and affect. behavior is normal. Judgment and thought content normal.  Recent Results (from the past 2160 hour(s))  BLADDER SCAN AMB NON-IMAGING     Status: None   Collection Time: 03/17/17 10:13 AM  Result Value Ref Range   Scan Result 16      PHQ2/9: Depression screen [facility] 2/9 02/12/2017 10/07/2016 07/09/2016 04/08/2016 03/04/2016  Decreased Interest 0 1 1 1 2   Down, Depressed, Hopeless 0 1 1 1 3   PHQ - 2 Score 0 2 2 2 5   Altered sleeping - 1 1 0 3  Tired, decreased energy - 0 0 0 3  Change in appetite - 0 0 1 2  Feeling bad or failure about yourself  - 0 0 1 3  Trouble concentrating - 0 1 0 0  Moving slowly or fidgety/restless - 0 0 0 0  Suicidal thoughts - 0 - 0 2  PHQ-9 Score - 3 4 4 18   Difficult doing work/chores - Somewhat difficult Somewhat difficult - Extremely dIfficult    Fall Risk: Fall Risk  05/16/2017 03/27/2017 02/12/2017 10/07/2016 07/09/2016  Falls in the past year? Yes No No No Yes  Number falls in past yr: 1 - - - 2 or more  Injury with Fall? No - - - No  Comment - - - - -  Follow up Education provided - - - Falls evaluation completed     Assessment & Plan  1. Hyperlipidemia with target LDL less than 100  - atorvastatin (LIPITOR) 40 MG tablet; TAKE 1 TABLET BY  MOUTH AT BEDTIME. GENERIC EQUIVALENT FOR LIPITOR  Dispense: 90 tablet; Refill: 1  2. GAD (generalized anxiety disorder)  -  DULoxetine (CYMBALTA) 60 MG capsule; Take 1 capsule (60 mg total) by mouth daily.  Dispense: 90 capsule; Refill: 1 - ALPRAZolam (XANAX XR) 0.5 MG 24 hr tablet; Take 1 tablet (0.5 mg total) by mouth daily.  Dispense: 30 tablet; Refill: 2  3. Chronic pain of multiple joints  - DULoxetine (CYMBALTA) 60 MG capsule; Take 1 capsule (60 mg total) by mouth daily.  Dispense: 90 capsule; Refill: 1  4. Depression, major, recurrent, moderate (HCC)  - DULoxetine (CYMBALTA) 60 MG capsule; Take 1 capsule (60 mg total) by mouth daily.  Dispense: 90 capsule; Refill: 1  5. Hypothyroidism due to acquired atrophy of thyroid  - levothyroxine (SYNTHROID, LEVOTHROID) 88 MCG tablet; TAKE 1 TABLET BY MOUTH DAILY BEFORE BREAKFAST.STOP LEVOTHYROXINE 100 MCG. SANDOZ GENERIC ONLY  Dispense: 90 tablet; Refill: 1  6. Chronic constipation  - linaclotide (LINZESS) 145 MCG CAPS capsule; TAKE 1 CAPSULE BY MOUTH ONCE DAILY BEFORE BREAKFAST  Dispense: 90 capsule; Refill: 1  7. Asthma, mild intermittent, well-controlled  - montelukast (SINGULAIR) 10 MG tablet; Take 1 tablet (10 mg total) by mouth at bedtime.  Dispense: 90 tablet; Refill: 1  8. Hypertension goal BP (blood pressure) < 140/90  - metoprolol succinate (TOPROL-XL) 25 MG 24 hr tablet; TK 1 T PO D  Dispense: 90 tablet; Refill: 1 - olmesartan-hydrochlorothiazide (BENICAR HCT) 40-12.5 MG tablet; Take 1 tablet by mouth daily.  Dispense: 90 tablet; Refill: 1  9. Gastroesophageal reflux disease without esophagitis  - omeprazole (PRILOSEC) 40 MG capsule; Take 1 capsule (40 mg total) by mouth daily.  Dispense: 90 capsule; Refill: 1  10. Insomnia, unspecified type  - QUEtiapine (SEROQUEL) 25 MG tablet; Take 1 tablet (25 mg total) by mouth at bedtime.  Dispense: 90 tablet; Refill: 1  11. Need for immunization against influenza  - Flu Vaccine QUAD 36+ mos IM  12. Grade II internal hemorrhoids  Confirmed on colonoscopy, she states she has recurrent symptoms,  occasionally with some blood when she wipes, using otc medication but would like rx. Advised to return for exam if symptoms persists after empirical therapy  - hydrocortisone (ANUSOL-HC) 25 MG suppository; Place 1 suppository (25 mg total) rectally 2 (two) times daily.  Dispense: 12 suppository; Refill: 0

## 2017-05-16 NOTE — Patient Instructions (Signed)

## 2017-05-20 ENCOUNTER — Ambulatory Visit: Payer: BLUE CROSS/BLUE SHIELD | Admitting: Psychiatry

## 2017-06-16 ENCOUNTER — Telehealth: Payer: Self-pay | Admitting: Family Medicine

## 2017-06-16 NOTE — Telephone Encounter (Signed)
She was given rx back in October, if she still has problems she needs to go back to Dr. Marius Ditch ( GI)

## 2017-06-16 NOTE — Telephone Encounter (Signed)
Copied from [place] [id number]. Topic: Quick Communication - See Telephone Encounter >> Jun 16, 2017 12:04 PM Cleaster Corin, NT wrote: CRM for notification. See Telephone encounter for:   06/16/17. Pt. Called and said she is waiting to hear from Dr. Ancil Boozer about depository rx. For her Hemorids please give pt. A call when sent over to pharmacy ([facility] - [address]) [place] [address] Phone: [phone] Fax: [phone] Not a 24 hour pharmacy; exact hours not known

## 2017-07-24 ENCOUNTER — Other Ambulatory Visit: Payer: Self-pay | Admitting: Family Medicine

## 2017-07-24 DIAGNOSIS — K219 Gastro-esophageal reflux disease without esophagitis: Secondary | ICD-10-CM

## 2017-08-05 ENCOUNTER — Other Ambulatory Visit: Payer: Self-pay | Admitting: Family Medicine

## 2017-08-05 DIAGNOSIS — E034 Atrophy of thyroid (acquired): Secondary | ICD-10-CM

## 2017-08-05 DIAGNOSIS — I10 Essential (primary) hypertension: Secondary | ICD-10-CM

## 2017-08-05 NOTE — Telephone Encounter (Signed)
Hypertension medication request: Metoprolol to [facility]  Last office visit pertaining to hypertension: 05/16/2017  BP Readings from Last 3 Encounters:  05/16/17 100/70  04/23/17 (!) 140/96  03/27/17 118/75    Lab Results  Component Value Date   CREATININE 0.74 07/09/2016   BUN 16 07/09/2016   NA 142 07/09/2016   K 3.3 (L) 07/09/2016   CL 101 07/09/2016   CO2 28 07/09/2016     Follow up on 08/18/2017  Refill request for thyroid medication. Levothyroxine to [facility]  Last Physical:  Lab Results  Component Value Date   TSH 2.21 02/12/2017

## 2017-08-18 ENCOUNTER — Ambulatory Visit: Payer: BLUE CROSS/BLUE SHIELD | Admitting: Family Medicine

## 2017-08-18 ENCOUNTER — Encounter: Payer: Self-pay | Admitting: Family Medicine

## 2017-08-18 VITALS — BP 126/72 | HR 102 | Temp 97.9°F | Resp 18 | Ht 65.0 in | Wt 255.7 lb

## 2017-08-18 DIAGNOSIS — I209 Angina pectoris, unspecified: Secondary | ICD-10-CM | POA: Diagnosis not present

## 2017-08-18 DIAGNOSIS — F411 Generalized anxiety disorder: Secondary | ICD-10-CM | POA: Diagnosis not present

## 2017-08-18 DIAGNOSIS — G4733 Obstructive sleep apnea (adult) (pediatric): Secondary | ICD-10-CM | POA: Diagnosis not present

## 2017-08-18 DIAGNOSIS — J4520 Mild intermittent asthma, uncomplicated: Secondary | ICD-10-CM | POA: Diagnosis not present

## 2017-08-18 DIAGNOSIS — F331 Major depressive disorder, recurrent, moderate: Secondary | ICD-10-CM | POA: Diagnosis not present

## 2017-08-18 DIAGNOSIS — Z9989 Dependence on other enabling machines and devices: Secondary | ICD-10-CM

## 2017-08-18 DIAGNOSIS — K1120 Sialoadenitis, unspecified: Secondary | ICD-10-CM | POA: Diagnosis not present

## 2017-08-18 DIAGNOSIS — R739 Hyperglycemia, unspecified: Secondary | ICD-10-CM | POA: Diagnosis not present

## 2017-08-18 DIAGNOSIS — E039 Hypothyroidism, unspecified: Secondary | ICD-10-CM

## 2017-08-18 DIAGNOSIS — I10 Essential (primary) hypertension: Secondary | ICD-10-CM | POA: Diagnosis not present

## 2017-08-18 DIAGNOSIS — Z6841 Body Mass Index (BMI) 40.0 and over, adult: Secondary | ICD-10-CM

## 2017-08-18 DIAGNOSIS — K641 Second degree hemorrhoids: Secondary | ICD-10-CM | POA: Diagnosis not present

## 2017-08-18 DIAGNOSIS — E785 Hyperlipidemia, unspecified: Secondary | ICD-10-CM | POA: Diagnosis not present

## 2017-08-18 MED ORDER — MOMETASONE FUROATE 220 MCG/INH IN AEPB: 2.0000 | INHALATION_SPRAY | Freq: Every day | RESPIRATORY_TRACT | 12 refills | Status: DC

## 2017-08-18 MED ORDER — ALPRAZOLAM ER 0.5 MG PO TB24: 0.5000 mg | ORAL_TABLET | Freq: Every day | ORAL | 2 refills | Status: DC

## 2017-08-18 MED ORDER — METOPROLOL SUCCINATE ER 25 MG PO TB24: ORAL_TABLET | ORAL | 0 refills | Status: DC

## 2017-08-18 MED ORDER — HYDROCORTISONE 2.5 % RE CREA: 1.0000 "application " | TOPICAL_CREAM | Freq: Two times a day (BID) | RECTAL | 0 refills | Status: DC

## 2017-08-18 MED ORDER — ALBUTEROL SULFATE (2.5 MG/3ML) 0.083% IN NEBU
2.5000 mg | INHALATION_SOLUTION | Freq: Once | RESPIRATORY_TRACT | Status: AC
  Administered 2017-08-18: 2.5 mg via RESPIRATORY_TRACT

## 2017-08-18 NOTE — Progress Notes (Signed)
Name: Beverly Barrett   MRN: [id number]    DOB: 1960/12/04   Date:08/18/2017       Progress Note  Subjective  Chief Complaint  Chief Complaint  Patient presents with  . Medication Refill    3 month F/U  . Hypertension    Edema in bilateral in ankles and has been very dizzy-fallen 3 times recently.  . Depression  . Hyperlipidemia  . Sleep Apnea    Still wearing CPAP machine and doing well, sleeps on average 6-7 hours nightly  . Gastroesophageal Reflux    Has not tried taking Omeprazole at night yet. Symptoms are still bothering here in the morning and at bedtime  . Asthma    Stable    HPI  Major Depression/GAD: She has a long history of depression and was started on Paroxetine 40 mg in the morning and Depakote 250 mg DR one tablet in the evenings, however since Depakote was not helping we tried Buspar ( May 2017 ) but again no improvement of symptoms. .We added Alprazolam XR 0.5 mg July 2017 and she has noticed some improvement in the anxiety level, but still has severe symptoms, we added Cymbalta 02/2016 and she was doing better, however feeling worse, feels like she is burden to her family.She still has occasionally has suicidal ideation but no planning ( she states she would not do it because of her grandson ) - she feels guilty about not contributing financially to her family and also because her health care is so expensive, so she feels like her family would be better off without her. She complains of depressed mood, fatigue, feelings of worthlessness/guilt, and insomnia ( that is partially controlled with Seroquel), also crying spells are back . Family history significant for anxiety and depression, one of her brother's attempted suicide many years ago. She has severe anxiety, difficulty interacting with others, she picks on her nails and makes herself bleed at times, constantly shaking her legs, restless, always worried but no longer snappy.She had an appointment scheduled with  psychiatrist, but not seen , she states she was trying to find out about insurance coverage. PHQ 9 very high, explained importance of follow up with psychiatrist.   HTN: she taking her medication Benicar HCTZ and started on Toprol XL , July 2017. She has noticed that fluttering sensation has improved with beta-blockers, BP is at goal today   Angina: had evaluation by cardiologist and Echo myoview in July normal result. She states chest pain still happens and at times during rest. She is on statin, aspirin, beta-blocker , ARB and has been taking NTG intermittently, but did not have to take any NTG since last visit. Pain is described as throbbing sensation on left side of chest and sometimes radiates to her left shoulder and back. Doing well at this time.   Constipation: she was started on Linzess July 2017 and states has bowel movements daily , no straining or blood in stools, hemoccult negative April 2018 . No side effects of medications   OSA: using CPAP every night, all night, and we started her on Seroquel  It still takes up to  2 hours to fall asleep but is able to stay asleep for about 7 hours.   Obesity: she is gaining weight again, she states she is trying to walk more  GERD:Seen by Dr. Marius Ditch and has gastritis, advised to stop goody powders and she has been following the recommendation. Waking up with nausea, but improves once she gest up and  takes Omeprazole and Dramamine . Advised to try Omeprazole prior to dinner to see if symptoms improves  Dyslipidemia: labs have improved, continue medications. No chest pain. Recheck labs   Hyperglycemia: last hgbA1C was 6.1% down from 6.2%, she always has polydipsia, but unchanged. We will recheck labs  Migraine: seen at the headache center by Dr. Jannifer Franklin, started on Topamax and Maxalt recently, still having headaches at this time, she was recently advised to try stopping Duloxetine and Seroquel and start Nortriptyline, but advised to hold  off until she sees psychiatrist, needs medication adjustment. She states migraine is intermittent but has episode of dizziness also.   Urinary urgency and incontinence: symptoms of low back and supra pubic pain again, urgency, nocturia, and sometimes she cannot make it to the bathroom. Seen by Urologist PA and is on Myrbetric and also premarin for vaginal atrophy, she has notice mild improvement of symptoms while on medication, getting samples now since insurance denied coveraged. Did not respond to Ditropan   Hypothyroidism she is currently taking medication daily, she has chronic dry skin and constipation ( resolved with Linzes), last TSH was  back to normal,  on Synthroid 88 mcg , recheck today   DDD lumbar spine: had MRI done, ordered by Ortho and was found to have DDD lumbar spine, seeing Dr. Sharlet Salina and had steroid injection with mild improvement of symptoms, states she has intermittent  pain radiating to lower extremities. Stable at this time  Hemorrhoids: she has along history of hemorrhoids, this time around has been been bothering her for the past month, seen by Dr. Marius Ditch and had EGD recently but did not talk to her about it. Previous colonoscopy 2016 and showed grade II hemorrhoids, negative hemoccult cards times 3 back 10/2016.   Asthma mild: long history, not currently on medication, she has daily cough and SOB with activity, she would like to try medication, spirometry reviewed with her today, we will try Asmanex and monitor  Patient Active Problem List   Diagnosis Date Noted  . Grade II internal hemorrhoids 05/16/2017  . Mixed stress and urge urinary incontinence 11/12/2016  . Degenerative arthritis of lumbar spine 11/12/2016  . Common migraine with intractable migraine 09/20/2016  . Iron deficiency anemia 09/12/2016  . GAD (generalized anxiety disorder) 12/20/2015  . OSA on CPAP 12/20/2015  . Angina effort ([place]) 12/20/2015  . Hyperglycemia 09/22/2015  . History of colonic  polyps   . Benign neoplasm of sigmoid colon   . Frequency 04/14/2015  . Backache 04/14/2015  . Chronic pain of multiple joints 06/14/2014  . Dysmetabolic syndrome 97/35/3299  . Acid reflux 06/14/2014  . Extreme obesity 06/14/2014  . Arthritis, degenerative 06/14/2014  . Morbid obesity with BMI of 40.0-44.9, adult ([place]) 06/14/2014  . Hypothyroidism due to acquired atrophy of thyroid 08/18/2013  . Hypothyroidism, unspecified 08/18/2013  . Hyperlipidemia 12/07/2010  . Hypertension goal BP (blood pressure) < 140/90 12/07/2010  . Familial multiple lipoprotein-type hyperlipidemia 12/07/2010  . CN (constipation) 11/21/2009  . Allergic rhinitis 10/13/2008  . Asthma, mild intermittent, well-controlled 09/01/2008  . Major depressive disorder, recurrent episode, in partial remission with anxious distress ([place]) 08/13/2007    Past Surgical History:  Procedure Laterality Date  . ABDOMINAL HYSTERECTOMY    . bladder tach  1995  . CESAREAN SECTION    . COLONOSCOPY WITH PROPOFOL N/A 05/29/2015   Procedure: COLONOSCOPY WITH PROPOFOL;  Surgeon: Lucilla Lame, MD;  Location: [facility];  Service: Endoscopy;  Laterality: N/A;  Latex allergy sleep apnea -  no CPAP machine (yet)  . ESOPHAGOGASTRODUODENOSCOPY N/A 04/23/2017   Procedure: ESOPHAGOGASTRODUODENOSCOPY (EGD);  Surgeon: Lin Landsman, MD;  Location: [facility];  Service: Gastroenterology;  Laterality: N/A;  Latex allergy sleep apnea  . POLYPECTOMY  05/29/2015   Procedure: POLYPECTOMY;  Surgeon: Lucilla Lame, MD;  Location: [facility];  Service: Endoscopy;;    Family History  Problem Relation Age of Onset  . Diabetes Sister   . Hypertension Sister   . Cirrhosis Mother   . Cirrhosis Father   . Breast cancer Sister 18  . Heart attack Brother   . Prostate cancer Neg Hx   . Kidney cancer Neg Hx   . Bladder Cancer Neg Hx     Social History   Socioeconomic History  . Marital status: Married    Spouse name:  Not on file  . Number of children: Not on file  . Years of education: Not on file  . Highest education level: Not on file  Social Needs  . Financial resource strain: Not on file  . Food insecurity - worry: Not on file  . Food insecurity - inability: Not on file  . Transportation needs - medical: Not on file  . Transportation needs - non-medical: Not on file  Occupational History  . Not on file  Tobacco Use  . Smoking status: Never Smoker  . Smokeless tobacco: Never Used  Substance and Sexual Activity  . Alcohol use: No    Alcohol/week: 0.0 oz  . Drug use: No  . Sexual activity: Yes    Partners: Male  Other Topics Concern  . Not on file  Social History Narrative   Lives with husband and one daughter other kids are out of the house   She does not work, husband on disability      Current Outpatient Medications:  .  ALPRAZolam (XANAX XR) 0.5 MG 24 hr tablet, Take 1 tablet (0.5 mg total) by mouth daily., Disp: 30 tablet, Rfl: 2 .  amoxicillin (AMOXIL) 875 MG tablet, Take 1 tablet by mouth every 12 (twelve) hours., Disp: , Rfl:  .  aspirin 81 MG EC tablet, Take 81 mg by mouth daily.  , Disp: , Rfl:  .  atorvastatin (LIPITOR) 40 MG tablet, TAKE 1 TABLET BY MOUTH AT BEDTIME. GENERIC EQUIVALENT FOR LIPITOR, Disp: 90 tablet, Rfl: 1 .  Azelastine HCl 0.15 % SOLN, Place 2 sprays into both nostrils 2 (two) times daily. , Disp: , Rfl: 3 .  conjugated estrogens (PREMARIN) vaginal cream, Place 1 Applicatorful vaginally daily. Apply 0.5mg  (pea-sized amount)  just inside the vaginal introitus with a finger-tip every night for two weeks and then Monday, Wednesday and Friday nights., Disp: 30 g, Rfl: 12 .  cyclobenzaprine (FLEXERIL) 5 MG tablet, Take 5 mg by mouth 3 (three) times daily as needed. , Disp: , Rfl: 0 .  diclofenac (VOLTAREN) 75 MG EC tablet, TAKE 1 TABLET BY MOUTH EVERY 12 HOURS AS NEEDED, Disp: 180 tablet, Rfl: 0 .  DULoxetine (CYMBALTA) 60 MG capsule, Take 1 capsule (60 mg total) by  mouth daily., Disp: 90 capsule, Rfl: 1 .  fluticasone (FLONASE) 50 MCG/ACT nasal spray, by Nasal route., Disp: , Rfl:  .  gabapentin (NEURONTIN) 100 MG capsule, Take 1 capsule (100 mg total) by mouth at bedtime., Disp: 90 capsule, Rfl: 1 .  levothyroxine (SYNTHROID, LEVOTHROID) 88 MCG tablet, TAKE 1 TABLET BY MOUTH DAILY BEFORE BREAKFAST.STOP LEVOTHYROXINE 100 MCG. SANDOZ GENERIC ONLY, Disp: 30 tablet, Rfl: 0 .  linaclotide (LINZESS) 145 MCG CAPS capsule, TAKE 1 CAPSULE BY MOUTH ONCE DAILY BEFORE BREAKFAST, Disp: 90 capsule, Rfl: 1 .  metoprolol succinate (TOPROL-XL) 25 MG 24 hr tablet, TAKE 1 TABLET BY MOUTH DAILY, Disp: 90 tablet, Rfl: 0 .  mirabegron ER (MYRBETRIQ) 25 MG TB24 tablet, Take 1 tablet (25 mg total) by mouth daily., Disp: 30 tablet, Rfl: 11 .  montelukast (SINGULAIR) 10 MG tablet, Take 1 tablet (10 mg total) by mouth at bedtime., Disp: 90 tablet, Rfl: 1 .  nitroGLYCERIN (NITROSTAT) 0.4 MG SL tablet, Place 1 tablet (0.4 mg total) under the tongue every 5 (five) minutes as needed., Disp: 25 tablet, Rfl: 0 .  olmesartan-hydrochlorothiazide (BENICAR HCT) 40-12.5 MG tablet, Take 1 tablet by mouth daily., Disp: 90 tablet, Rfl: 1 .  omeprazole (PRILOSEC) 40 MG capsule, TAKE ONE CAPSULE BY MOUTH DAILY, Disp: 90 capsule, Rfl: 0 .  QUEtiapine (SEROQUEL) 25 MG tablet, Take 1 tablet (25 mg total) by mouth at bedtime., Disp: 90 tablet, Rfl: 1 .  rizatriptan (MAXALT) 10 MG tablet, Take 1 tablet (10 mg total) by mouth 3 (three) times daily as needed for migraine., Disp: 10 tablet, Rfl: 3 .  EQ ALLERGY RELIEF 10 MG tablet, TAKE ONE TABLET BY MOUTH ONCE DAILY, Disp: 90 tablet, Rfl: 1 .  hydrocortisone (ANUSOL-HC) 25 MG suppository, Place 1 suppository (25 mg total) rectally 2 (two) times daily. (Patient not taking: Reported on 08/18/2017), Disp: 12 suppository, Rfl: 0 No current facility-administered medications for this visit.   Facility-Administered Medications Ordered in Other Visits:  .   gadopentetate dimeglumine (MAGNEVIST) injection 20 mL, 20 mL, Intravenous, Once PRN, Ward Givens, NP  Allergies  Allergen Reactions  . Latex     Pt unsure of reaction pt states something happened while in the hospital after surgery.     ROS   Constitutional: Negative for fever , positive for mild  weight change.  Respiratory: Positive  for cough and shortness of breath.   Cardiovascular: Positive for chest pain intermittent but no recent  palpitations.  Gastrointestinal: Negative  for abdominal pain, no bowel changes.  Musculoskeletal: positive for gait problem and intermittent joint swelling.  Skin: Negative for rash.  Neurological: Positive  For intermittent  dizziness, positive for  headache.  No other specific complaints in a complete review of systems (except as listed in HPI above).  Objective  Vitals:   08/18/17 1001  BP: 126/72  Pulse: (!) 102  Resp: 18  Temp: 97.9 F (36.6 C)  TempSrc: Oral  SpO2: 97%  Weight: 255 lb 11.2 oz (116 kg)  Height: 5\' 5"  (1.651 m)    Body mass index is 42.55 kg/m.  Physical Exam  Constitutional: Patient appears well-developed and well-nourished. Obese  No distress.  HEENT: head atraumatic, normocephalic, pupils equal and reactive to light, ears norma TM bilaterally, tender to palpation of right submandibularly gland, neck supple, throat within normal limits Cardiovascular: Normal rate, regular rhythm and normal heart sounds.  No murmur heard. No BLE edema. Pulmonary/Chest: Effort normal and breath sounds normal. No respiratory distress. Abdominal: Soft.  There is no tenderness. Psychiatric: Patient has a normal mood and affect. behavior is normal. Judgment and thought content normal. Muscular Skeletal: pain during palpation of lumbar spine, negative straight leg raise   PHQ2/9: Depression screen [facility] 2/9 08/18/2017 02/12/2017 10/07/2016 07/09/2016 04/08/2016  Decreased Interest 2 0 1 1 1   Down, Depressed, Hopeless 3 0 1 1 1   PHQ  - 2 Score 5 0 2 2 2  Altered sleeping 3 - 1 1 0  Tired, decreased energy 3 - 0 0 0  Change in appetite 2 - 0 0 1  Feeling bad or failure about yourself  2 - 0 0 1  Trouble concentrating 2 - 0 1 0  Moving slowly or fidgety/restless 2 - 0 0 0  Suicidal thoughts 2 - 0 - 0  PHQ-9 Score 21 - 3 4 4   Difficult doing work/chores Very difficult - Somewhat difficult Somewhat difficult -     Fall Risk: Fall Risk  08/18/2017 05/16/2017 03/27/2017 02/12/2017 10/07/2016  Falls in the past year? Yes Yes No No No  Number falls in past yr: 2 or more 1 - - -  Injury with Fall? Yes No - - -  Comment Contusion on her legs and arms - - - -  Follow up - Education provided - - -     Functional Status Survey: Is the patient deaf or have difficulty hearing?: No Does the patient have difficulty seeing, even when wearing glasses/contacts?: No Does the patient have difficulty concentrating, remembering, or making decisions?: No Does the patient have difficulty walking or climbing stairs?: No Does the patient have difficulty dressing or bathing?: No Does the patient have difficulty doing errands alone such as visiting a doctor's office or shopping?: No   Assessment & Plan  1. Depression, major, recurrent, moderate ([place])  Continue medication  Symptoms are severe, recommended psychiatrist, but she is worried about cost, she will call us back when ready for referral   2. GAD (generalized anxiety disorder)  - ALPRAZolam (XANAX XR) 0.5 MG 24 hr tablet; Take 1 tablet (0.5 mg total) by mouth daily.  Dispense: 30 tablet; Refill: 2  3. Hypertension goal BP (blood pressure) < 140/90  - metoprolol succinate (TOPROL-XL) 25 MG 24 hr tablet; TAKE 1 TABLET BY MOUTH DAILY  Dispense: 90 tablet; Refill: 0 - CBC with Differential/Platelet - COMPLETE METABOLIC PANEL WITH GFR  4. Morbid obesity with BMI of 40.0-44.9, adult ([facility])  - Hemoglobin A1c  5. Angina pectoris ([facility])  Stable, has NTG at home   6. OSA on  CPAP    7. Asthma, mild intermittent, well-controlled  She states albuterol helped with symptoms, we will try Asmanex - mometasone (ASMANEX 120 METERED DOSES) 220 MCG/INH inhaler; Inhale 2 puffs into the lungs daily.  Dispense: 1 Inhaler; Refill: 12  8. Salivary gland infection  Se[facility] at the [facility] days ago and is on Augmentin, also given Meloxicam but has Voltaren on file, advised to stop Meloxicam, swelling is going down, drink water, lemon drops and follow up if no resolution   9. Grade II hemorrhoids  She did not go back to Dr. Marius Ditch and would like some topical medication first - hydrocortisone (ANUSOL-HC) 2.5 % rectal cream; Place 1 application rectally 2 (two) times daily.  Dispense: 30 g; Refill: 0  10. Hypothyroidism, adult  - TSH  11. Dyslipidemia  - Lipid panel  12. Hyperglycemia  - Hemoglobin A1c

## 2017-08-18 NOTE — Addendum Note (Signed)
Addended by: Vonna Kotyk L on: 08/18/2017 11:50 AM   Modules accepted: Orders

## 2017-08-19 ENCOUNTER — Encounter: Payer: Self-pay | Admitting: Family Medicine

## 2017-08-19 ENCOUNTER — Other Ambulatory Visit: Payer: Self-pay | Admitting: Family Medicine

## 2017-08-19 LAB — LIPID PANEL
CHOL/HDL RATIO: 3.9 (calc) (ref <5.0)
Cholesterol: 172 mg/dL (ref <200)
HDL: 44 mg/dL — ABNORMAL LOW (ref >50)
LDL CHOLESTEROL (CALC): 99 mg/dL
NON-HDL CHOLESTEROL (CALC): 128 mg/dL (ref <130)
Triglycerides: 196 mg/dL — ABNORMAL HIGH (ref <150)

## 2017-08-19 LAB — CBC WITH DIFFERENTIAL/PLATELET
BASOS PCT: 0.5 %
Basophils Absolute: 21 cells/uL (ref 0–200)
EOS PCT: 2.7 %
Eosinophils Absolute: 111 cells/uL (ref 15–500)
HCT: 35.1 % (ref 35.0–45.0)
Hemoglobin: 11.3 g/dL — ABNORMAL LOW (ref 11.7–15.5)
LYMPHS ABS: 1136 {cells}/uL (ref 850–3900)
MCH: 29.6 pg (ref 27.0–33.0)
MCHC: 32.2 g/dL (ref 32.0–36.0)
MCV: 91.9 fL (ref 80.0–100.0)
MONOS PCT: 6.6 %
MPV: 10.2 fL (ref 7.5–12.5)
NEUTROS ABS: 2563 {cells}/uL (ref 1500–7800)
Neutrophils Relative %: 62.5 %
Platelets: 194 10*3/uL (ref 140–400)
RBC: 3.82 10*6/uL (ref 3.80–5.10)
RDW: 14.6 % (ref 11.0–15.0)
Total Lymphocyte: 27.7 %
WBC mixed population: 271 cells/uL (ref 200–950)
WBC: 4.1 10*3/uL (ref 3.8–10.8)

## 2017-08-19 LAB — TEST AUTHORIZATION

## 2017-08-19 LAB — COMPLETE METABOLIC PANEL WITH GFR
AG Ratio: 1.4 (calc) (ref 1.0–2.5)
ALBUMIN MSPROF: 4.2 g/dL (ref 3.6–5.1)
ALT: 14 U/L (ref 6–29)
AST: 17 U/L (ref 10–35)
Alkaline phosphatase (APISO): 68 U/L (ref 33–130)
BUN: 12 mg/dL (ref 7–25)
CALCIUM: 9.4 mg/dL (ref 8.6–10.4)
CO2: 30 mmol/L (ref 20–32)
CREATININE: 0.71 mg/dL (ref 0.50–1.05)
Chloride: 101 mmol/L (ref 98–110)
GFR, EST AFRICAN AMERICAN: 109 mL/min/{1.73_m2} (ref >=60)
GFR, EST NON AFRICAN AMERICAN: 94 mL/min/{1.73_m2} (ref >=60)
GLUCOSE: 125 mg/dL — AB (ref 65–99)
Globulin: 3.1 g/dL (calc) (ref 1.9–3.7)
Potassium: 3.4 mmol/L — ABNORMAL LOW (ref 3.5–5.3)
Sodium: 142 mmol/L (ref 135–146)
TOTAL PROTEIN: 7.3 g/dL (ref 6.1–8.1)
Total Bilirubin: 0.5 mg/dL (ref 0.2–1.2)

## 2017-08-19 LAB — HEMOGLOBIN A1C
HEMOGLOBIN A1C: 6.6 %{Hb} — AB (ref <5.7)
Mean Plasma Glucose: 143 (calc)
eAG (mmol/L): 7.9 (calc)

## 2017-08-19 LAB — IRON,TIBC AND FERRITIN PANEL
%SAT: 8 % (calc) — ABNORMAL LOW (ref 11–50)
Ferritin: 21 ng/mL (ref 10–232)
Iron: 34 ug/dL — ABNORMAL LOW (ref 45–160)
TIBC: 437 mcg/dL (calc) (ref 250–450)

## 2017-08-19 LAB — TSH: TSH: 3.46 m[IU]/L (ref 0.40–4.50)

## 2017-08-19 NOTE — Addendum Note (Signed)
Addended by: Inda Coke on: 08/19/2017 02:17 PM   Modules accepted: Orders

## 2017-08-25 ENCOUNTER — Telehealth: Payer: Self-pay | Admitting: Urology

## 2017-08-25 NOTE — Telephone Encounter (Signed)
Pt called office stating she is out of Mybetriq and would like to come to office to pick up more. Pt is out of samples. Please advise pt, Thanks.

## 2017-08-25 NOTE — Telephone Encounter (Signed)
Patient states she is still waiting on approval from Insurance. Samples given.

## 2017-08-28 ENCOUNTER — Other Ambulatory Visit: Payer: Self-pay | Admitting: Family Medicine

## 2017-08-28 DIAGNOSIS — E034 Atrophy of thyroid (acquired): Secondary | ICD-10-CM

## 2017-08-28 NOTE — Telephone Encounter (Signed)
Refill request for thyroid medication: Levothyroxine 88 mcg  Last Physical: None Indicated  Lab Results  Component Value Date   TSH 3.46 08/18/2017    Follow-up on file. 12/17/2017

## 2017-09-05 ENCOUNTER — Other Ambulatory Visit: Payer: Self-pay | Admitting: Family Medicine

## 2017-09-05 DIAGNOSIS — F411 Generalized anxiety disorder: Secondary | ICD-10-CM

## 2017-09-05 DIAGNOSIS — F331 Major depressive disorder, recurrent, moderate: Secondary | ICD-10-CM

## 2017-09-05 DIAGNOSIS — G8929 Other chronic pain: Secondary | ICD-10-CM

## 2017-09-05 DIAGNOSIS — M2550 Pain in unspecified joint: Secondary | ICD-10-CM

## 2017-09-26 ENCOUNTER — Encounter: Payer: Self-pay | Admitting: Neurology

## 2017-09-26 ENCOUNTER — Ambulatory Visit: Payer: BLUE CROSS/BLUE SHIELD | Admitting: Neurology

## 2017-09-26 VITALS — BP 152/100 | HR 70 | Wt 259.5 lb

## 2017-09-26 DIAGNOSIS — G43019 Migraine without aura, intractable, without status migrainosus: Secondary | ICD-10-CM | POA: Diagnosis not present

## 2017-09-26 MED ORDER — DULOXETINE HCL 30 MG PO CPEP: 30.0000 mg | ORAL_CAPSULE | Freq: Every day | ORAL | 0 refills | Status: DC

## 2017-09-26 MED ORDER — RIZATRIPTAN BENZOATE 10 MG PO TABS: 10.0000 mg | ORAL_TABLET | Freq: Three times a day (TID) | ORAL | 5 refills | Status: DC | PRN

## 2017-09-26 NOTE — Progress Notes (Signed)
Reason for visit: Migraine headache  Beverly Barrett is an 57 y.o. female  History of present illness:  Ms. Eckhart is a 57 year old right-handed black female with a history of intractable migraine headache.  The patient has significant obesity, she has sleep apnea on CPAP.  She has difficulty sleeping in part because of this.  She is on Seroquel at night for sleep but this is not helpful.  She was placed on a very low-dose of gabapentin for her headaches taking 100 mg at night.  She has gained no benefit from this.  She has come off of Topamax as this was not helping.  She has used Maxalt if needed for the headache, she has tried to reduce the caffeine intake.  The patient still has 2-3 migraine headaches a week.  The patient does have problems with talking in her sleep, she may get up frequently at night.  The patient returns to this office for an evaluation.  Past Medical History:  Diagnosis Date  . Anginal pain ([place])    none in approx 2 yrs  . Anxiety   . Asthma   . Chronic lower back pain   . Common migraine with intractable migraine 09/20/2016  . Depression    suicidal ideation  . GERD (gastroesophageal reflux disease)   . Headache    migraines -3x/wk  . Hyperlipidemia   . Hypertension   . Hypothyroidism   . Motion sickness    cars  . Multilevel degenerative disc disease   . Shortness of breath dyspnea   . Sleep apnea    CPAP  . Vertigo   . Wears dentures    partial upper    Past Surgical History:  Procedure Laterality Date  . ABDOMINAL HYSTERECTOMY    . bladder tach  1995  . CESAREAN SECTION    . COLONOSCOPY WITH PROPOFOL N/A 05/29/2015   Procedure: COLONOSCOPY WITH PROPOFOL;  Surgeon: Lucilla Lame, MD;  Location: [facility];  Service: Endoscopy;  Laterality: N/A;  Latex allergy sleep apnea - no CPAP machine (yet)  . ESOPHAGOGASTRODUODENOSCOPY N/A 04/23/2017   Procedure: ESOPHAGOGASTRODUODENOSCOPY (EGD);  Surgeon: Lin Landsman, MD;  Location:  [facility];  Service: Gastroenterology;  Laterality: N/A;  Latex allergy sleep apnea  . POLYPECTOMY  05/29/2015   Procedure: POLYPECTOMY;  Surgeon: Lucilla Lame, MD;  Location: [facility];  Service: Endoscopy;;    Family History  Problem Relation Age of Onset  . Diabetes Sister   . Hypertension Sister   . Cirrhosis Mother   . Cirrhosis Father   . Breast cancer Sister 71  . Heart attack Brother   . Prostate cancer Neg Hx   . Kidney cancer Neg Hx   . Bladder Cancer Neg Hx     Social history:  reports that  has never smoked. she has never used smokeless tobacco. She reports that she does not drink alcohol or use drugs.    Allergies  Allergen Reactions  . Latex     Pt unsure of reaction pt states something happened while in the hospital after surgery.    Medications:  Prior to Admission medications   Medication Sig Start Date End Date Taking? Authorizing Provider  ALPRAZolam (XANAX XR) 0.5 MG 24 hr tablet Take 1 tablet (0.5 mg total) by mouth daily. 08/18/17  Yes Steele Sizer, MD  aspirin 81 MG EC tablet Take 81 mg by mouth daily.     Yes [provider]  atorvastatin (LIPITOR) 40 MG tablet  TAKE 1 TABLET BY MOUTH AT BEDTIME. GENERIC EQUIVALENT FOR LIPITOR 05/16/17  Yes Sowles, Drue Stager, MD  Azelastine HCl 0.15 % SOLN Place 2 sprays into both nostrils 2 (two) times daily.  01/14/15  Yes [provider]  conjugated estrogens (PREMARIN) vaginal cream Place 1 Applicatorful vaginally daily. Apply 0.5mg  (pea-sized amount)  just inside the vaginal introitus with a finger-tip every night for two weeks and then Monday, Wednesday and Friday nights. 02/13/17  Yes McGowan, Larene Beach A, PA-C  cyclobenzaprine (FLEXERIL) 5 MG tablet Take 5 mg by mouth 3 (three) times daily as needed.  04/18/17  Yes Duanne Guess, PA-C  diclofenac (VOLTAREN) 75 MG EC tablet TAKE 1 TABLET BY MOUTH EVERY 12 HOURS AS NEEDED 12/30/16  Yes Hubbard Hartshorn, FNP  DULoxetine (CYMBALTA) 60 MG  capsule Take 1 capsule (60 mg total) by mouth daily. 05/16/17  Yes Sowles, Drue Stager, MD  EQ ALLERGY RELIEF 10 MG tablet TAKE ONE TABLET BY MOUTH ONCE DAILY 07/25/15  Yes Bobetta Lime, MD  fluticasone (FLONASE) 50 MCG/ACT nasal spray by Nasal route. 04/11/14  Yes [provider]  gabapentin (NEURONTIN) 100 MG capsule Take 1 capsule (100 mg total) by mouth at bedtime. 05/07/17  Yes Ward Givens, NP  hydrocortisone (ANUSOL-HC) 2.5 % rectal cream Place 1 application rectally 2 (two) times daily. 08/18/17  Yes Sowles, Drue Stager, MD  hydrocortisone (ANUSOL-HC) 25 MG suppository Place 1 suppository (25 mg total) rectally 2 (two) times daily. 05/16/17  Yes Sowles, Drue Stager, MD  levothyroxine (SYNTHROID, LEVOTHROID) 88 MCG tablet TAKE 1 TABLET BY MOUTH DAILY BEFORE BREAKFAST. SANDOZ GENERIC ONLY.(STOP LEVOTHYROXINE 100MCG) 08/28/17  Yes Sowles, Drue Stager, MD  linaclotide [facility]) 145 MCG CAPS capsule TAKE 1 CAPSULE BY MOUTH ONCE DAILY BEFORE BREAKFAST 05/16/17  Yes Sowles, Drue Stager, MD  metoprolol succinate (TOPROL-XL) 25 MG 24 hr tablet TAKE 1 TABLET BY MOUTH DAILY 08/18/17  Yes Sowles, Drue Stager, MD  mirabegron ER (MYRBETRIQ) 25 MG TB24 tablet Take 1 tablet (25 mg total) by mouth daily. 03/17/17  Yes McGowan, Shannon A, PA-C  mometasone (ASMANEX 120 METERED DOSES) 220 MCG/INH inhaler Inhale 2 puffs into the lungs daily. 08/18/17  Yes Sowles, Drue Stager, MD  montelukast (SINGULAIR) 10 MG tablet Take 1 tablet (10 mg total) by mouth at bedtime. 05/16/17  Yes Sowles, Drue Stager, MD  nitroGLYCERIN (NITROSTAT) 0.4 MG SL tablet Place 1 tablet (0.4 mg total) under the tongue every 5 (five) minutes as needed. 02/20/17  Yes Gollan, Kathlene November, MD  olmesartan-hydrochlorothiazide (BENICAR HCT) 40-12.5 MG tablet Take 1 tablet by mouth daily. 05/16/17  Yes Sowles, Drue Stager, MD  omeprazole (PRILOSEC) 40 MG capsule TAKE ONE CAPSULE BY MOUTH DAILY 07/24/17  Yes Sowles, Drue Stager, MD  QUEtiapine (SEROQUEL) 25 MG tablet Take 1 tablet  (25 mg total) by mouth at bedtime. 05/16/17  Yes Sowles, Drue Stager, MD  rizatriptan (MAXALT) 10 MG tablet Take 1 tablet (10 mg total) by mouth 3 (three) times daily as needed for migraine. Patient not taking: Reported on 09/26/2017 09/20/16   Kathrynn Ducking, MD    ROS:  Out of a complete 14 system review of symptoms, the patient complains only of the following symptoms, and all other reviewed systems are negative.  Migraine headache Insomnia  Blood pressure (!) 152/100, pulse 70, weight 259 lb 8 oz (117.7 kg).  Physical Exam  General: The patient is alert and cooperative at the time of the examination.  The patient is markedly obese.  Skin: No significant peripheral edema is noted.   Neurologic Exam  Mental status: The patient is alert and oriented x 3 at the time of the examination. The patient has apparent normal recent and remote memory, with an apparently normal attention span and concentration ability.   Cranial nerves: Facial symmetry is present. Speech is normal, no aphasia or dysarthria is noted. Extraocular movements are full. Visual fields are full.  Motor: The patient has good strength in all 4 extremities.  Sensory examination: Soft touch sensation is symmetric on the face, arms, and legs.  Coordination: The patient has good finger-nose-finger and heel-to-shin bilaterally.  Gait and station: The patient has a normal gait. Tandem gait is normal. Romberg is negative. No drift is seen.  Reflexes: Deep tendon reflexes are symmetric.   MRI brain 04/02/17:  IMPRESSION:  Unremarkable MRI brain (with and without). No acute findings.  * MRI scan images were reviewed online. I agree with the written report.    Assessment/Plan:  1.  Migraine headache, intractable  The patient will be increased on the Seroquel which may help migraine, she will go to 50 mg at night, she will call for any dose adjustments.  The patient is on Cymbalta, she believes that when she went on  this medication the migraines worsened.  Some individuals may have significant exacerbation of migraine on this medication.  The Cymbalta will be reduced to 30 mg daily for 2 weeks and then stop.  We may need to increase the gabapentin significantly in the future, the patient will keep in touch if her headaches are not improving.  The patient will be given a prescription for Maxalt, she will follow-up in 4 months.  Jill Alexanders MD 09/26/2017 8:43 AM  [facility] [address] [place], [place] [address]  Phone [phone] Fax [phone]

## 2017-09-26 NOTE — Patient Instructions (Signed)
   With the Seroquel 25 mg tablets, take 2 at night, call for any dose adjustments.  Reduce the Cymbalta to 30 mg a day for 2 weeks, then stop.  Use Maxalt if needed for the headache.  Call if the headaches are not improving.

## 2017-10-06 ENCOUNTER — Telehealth: Payer: Self-pay | Admitting: Radiology

## 2017-10-06 NOTE — Telephone Encounter (Signed)
Pt requests Myrbetriq samples. Please return call.

## 2017-10-06 NOTE — Telephone Encounter (Signed)
We are no longer able to supply Myrbetriq samples.  I can send in a prescription for her.

## 2017-10-06 NOTE — Telephone Encounter (Signed)
Spoke with patient she states she did not know she had a script at the pharmacy and will contact them for a refill

## 2017-10-13 ENCOUNTER — Telehealth: Payer: Self-pay

## 2017-10-13 NOTE — Telephone Encounter (Signed)
PA for myrbetriq DENIED!

## 2017-10-14 ENCOUNTER — Other Ambulatory Visit: Payer: Self-pay | Admitting: Family Medicine

## 2017-10-14 DIAGNOSIS — K219 Gastro-esophageal reflux disease without esophagitis: Secondary | ICD-10-CM

## 2017-10-14 NOTE — Telephone Encounter (Signed)
Refill request for general medication. Omeprazole to [facility].   Last office visit: 08/18/2017   Follow up on 12/17/2017

## 2017-10-30 ENCOUNTER — Other Ambulatory Visit: Payer: Self-pay | Admitting: Family Medicine

## 2017-10-30 ENCOUNTER — Telehealth: Payer: Self-pay | Admitting: Neurology

## 2017-10-30 DIAGNOSIS — G4700 Insomnia, unspecified: Secondary | ICD-10-CM

## 2017-10-30 MED ORDER — QUETIAPINE FUMARATE 100 MG PO TABS: 100.0000 mg | ORAL_TABLET | Freq: Every day | ORAL | 3 refills | Status: DC

## 2017-10-30 MED ORDER — DULOXETINE HCL 30 MG PO CPEP: 30.0000 mg | ORAL_CAPSULE | Freq: Two times a day (BID) | ORAL | 0 refills | Status: DC

## 2017-10-30 NOTE — Telephone Encounter (Signed)
Pt called stating she has been taking medications as prescribed(Cymbalta, Maxalt, Seroquel) but headaches haven't improved. PCP wanting pt to go back on Cymbalta. Please call to discuss

## 2017-10-30 NOTE — Telephone Encounter (Signed)
Refill request for general medication. Seroquel to [facility]  Last office visit: 08/18/2017  Follow up 12/17/2017

## 2017-10-30 NOTE — Telephone Encounter (Signed)
I called the patient.  The patient still having headaches, her primary care physician wants her to go back on the Cymbalta taking 30 mg twice daily.  The Seroquel be increased to 100 mg at night.  A prescription was sent in.

## 2017-11-11 ENCOUNTER — Other Ambulatory Visit: Payer: Self-pay | Admitting: Family Medicine

## 2017-11-11 DIAGNOSIS — J4520 Mild intermittent asthma, uncomplicated: Secondary | ICD-10-CM

## 2017-11-22 ENCOUNTER — Other Ambulatory Visit: Payer: Self-pay | Admitting: Family Medicine

## 2017-11-22 DIAGNOSIS — E034 Atrophy of thyroid (acquired): Secondary | ICD-10-CM

## 2017-12-08 ENCOUNTER — Other Ambulatory Visit: Payer: Self-pay | Admitting: Family Medicine

## 2017-12-08 DIAGNOSIS — F411 Generalized anxiety disorder: Secondary | ICD-10-CM

## 2017-12-09 NOTE — Telephone Encounter (Signed)
Refill request for general medication. Alprazolam to [facility].   Last office visit: 08/18/2017   Follow up 12/17/2017

## 2017-12-17 ENCOUNTER — Ambulatory Visit: Payer: BLUE CROSS/BLUE SHIELD | Admitting: Family Medicine

## 2017-12-17 ENCOUNTER — Encounter: Payer: Self-pay | Admitting: Family Medicine

## 2017-12-17 VITALS — BP 118/88 | HR 83 | Temp 98.6°F | Resp 16 | Ht 65.0 in | Wt 254.7 lb

## 2017-12-17 DIAGNOSIS — R232 Flushing: Secondary | ICD-10-CM | POA: Diagnosis not present

## 2017-12-17 DIAGNOSIS — K219 Gastro-esophageal reflux disease without esophagitis: Secondary | ICD-10-CM | POA: Diagnosis not present

## 2017-12-17 DIAGNOSIS — F411 Generalized anxiety disorder: Secondary | ICD-10-CM

## 2017-12-17 DIAGNOSIS — E785 Hyperlipidemia, unspecified: Secondary | ICD-10-CM | POA: Diagnosis not present

## 2017-12-17 DIAGNOSIS — K5909 Other constipation: Secondary | ICD-10-CM | POA: Diagnosis not present

## 2017-12-17 DIAGNOSIS — R42 Dizziness and giddiness: Secondary | ICD-10-CM

## 2017-12-17 DIAGNOSIS — I10 Essential (primary) hypertension: Secondary | ICD-10-CM | POA: Diagnosis not present

## 2017-12-17 DIAGNOSIS — J4520 Mild intermittent asthma, uncomplicated: Secondary | ICD-10-CM | POA: Diagnosis not present

## 2017-12-17 DIAGNOSIS — E039 Hypothyroidism, unspecified: Secondary | ICD-10-CM

## 2017-12-17 DIAGNOSIS — Z6841 Body Mass Index (BMI) 40.0 and over, adult: Secondary | ICD-10-CM | POA: Diagnosis not present

## 2017-12-17 DIAGNOSIS — F331 Major depressive disorder, recurrent, moderate: Secondary | ICD-10-CM | POA: Diagnosis not present

## 2017-12-17 MED ORDER — LINACLOTIDE 145 MCG PO CAPS: ORAL_CAPSULE | ORAL | 1 refills | Status: DC

## 2017-12-17 MED ORDER — ATORVASTATIN CALCIUM 40 MG PO TABS: ORAL_TABLET | ORAL | 1 refills | Status: DC

## 2017-12-17 MED ORDER — OMEPRAZOLE 40 MG PO CPDR: 40.0000 mg | DELAYED_RELEASE_CAPSULE | Freq: Every day | ORAL | 0 refills | Status: DC

## 2017-12-17 MED ORDER — CLONIDINE HCL 0.1 MG PO TABS: 0.1000 mg | ORAL_TABLET | Freq: Two times a day (BID) | ORAL | 0 refills | Status: DC

## 2017-12-17 MED ORDER — ALPRAZOLAM ER 0.5 MG PO TB24: 0.5000 mg | ORAL_TABLET | Freq: Every day | ORAL | 2 refills | Status: DC

## 2017-12-17 MED ORDER — MONTELUKAST SODIUM 10 MG PO TABS: 10.0000 mg | ORAL_TABLET | Freq: Every day | ORAL | 1 refills | Status: DC

## 2017-12-17 MED ORDER — METOPROLOL SUCCINATE ER 50 MG PO TB24: ORAL_TABLET | ORAL | 1 refills | Status: DC

## 2017-12-17 MED ORDER — OLMESARTAN MEDOXOMIL-HCTZ 40-25 MG PO TABS: 1.0000 | ORAL_TABLET | Freq: Every day | ORAL | 1 refills | Status: DC

## 2017-12-17 NOTE — Addendum Note (Signed)
Addended by: Steele Sizer F on: 12/17/2017 10:48 AM   Modules accepted: Orders

## 2017-12-17 NOTE — Progress Notes (Signed)
Name: Beverly Barrett   MRN: [id number]    DOB: 08-01-1960   Date:12/17/2017       Progress Note  Subjective  Chief Complaint  Chief Complaint  Patient presents with  . Medication Refill  . Hypertension    Edema and Dizziness since Sunday BP has been elevated 156/97 at home  . Depression  . Sleep Apnea    Coughing so much has to take her CPAP off sometimes at night because she feels like she is choking  . Gastroesophageal Reflux    Takes medication in the morning but in the evening she is having burning in her chest and nausea  . Asthma    Has been very SOB, coughing at night time  . Peripheral Neuropathy    Has been having burning in her feet and hands    HPI   Major Depression/GAD: She has a long history of depression and was started on Paroxetine 40 mg in the morning and Depakote 250 mg DR one tablet in the evenings, however since Depakote was not helping we tried Buspar ( May 2017 ) but again no improvement of symptoms. .We added Alprazolam XR 0.5 mg July 2017 and she has noticed some improvement in the anxiety level, but still has severe symptoms, we added Cymbalta 02/2016 and she was doing better, however feeling worse, feels like she is burden to her family.She still has occasionally has suicidal ideation but no planning ( she states she would not do it because of her grandson ) - she feels guilty about not contributing financially to her family and also because her health care is so expensive, so she feels like her family would be better off without her. She complains of depressed mood, fatigue, feelings of worthlessness/guilt, and insomnia ( that is partiallycontrolled with Seroquel), also crying spells are back . Family history significant for anxiety and depression, one of her brother's attempted suicide many years ago. She has severe anxiety, difficulty interacting with others, she picks on her nails and makes herself bleed at times, constantly shaking her legs, restless,  always worried but no longer snappy.She had an appointment scheduled with psychiatrist, but not seen, we will place another referral, explained importance of getting psychiatric help   HTN: she taking her medication BenicarHCTZ and started on Toprol XL , July 2017.She has noticed dizziness over the past few days, states bp has been running high, and usually gets dizzy when bp is not controlled. We will adjust dose and monitor for now  Angina: had evaluation by cardiologist and Echo myoview in July normal result. She states chest pain still happens and at times during rest. She is on statin, aspirin, beta-blocker , ARB and has been taking NTG intermittently, but did not have to take any NTG since last visit. Pain is described as throbbing sensation on left side of chest and sometimes radiates to her left shoulder and back. Doing well at this time, no recent episodes of chest pain   Constipation: she was started on Linzess July 2017 and states has bowel movements daily , no straining or blood in stools, hemoccult negative April 2018 . No side effects of medications Unchanged . Rectal bleeding from hemorrhoids has been better  OSA: using CPAP every night, all night, and we started her on Seroquel It still takes up to2 hours to fall asleep but is able to stay asleep for about 7 hours. Still compliant   Obesity: she lost weight since last visit, going for walks with her  daughter 2 times a week.   GERD:Seen by Dr. Marius Ditch and has gastritis, advised to stop goody powders and she has been following the recommendation. Waking up with nausea, but improves once she gest up and takes Omeprazole and Dramamine . Advised to try Omeprazole prior to dinner to see if symptoms improves  Dyslipidemia:labs have improved, continue medications. No chest pain.Reviewed   Hyperglycemia: last hgbA1C was6.1% down from 6.2%, shealways has polydipsia, but unchanged.We will recheck labs  Migraine: [facility] at  [facility] by Dr. Jannifer Franklin, started on Topamax and Maxalt recently, still having headaches at this time, she is off Topamax, and on gabapentin, but still having symptoms. Dr. Jannifer Franklin advised to go up on Seroquel to 100 mg but makes too sleepy and she is taking half pill, he gave her some duloxetine, but advised follow up with psychiatrist also  Urinary urgency and incontinence: symptoms of low back and supra pubic pain again, urgency, nocturia, and sometimes she cannot make it to the bathroom. Seen by Urologist PA and is on Myrbetric and also premarin for vaginal atrophy, she has notice mild improvement of symptoms while on medication,getting samples now since insurance denied coveraged.Did not respond to Ditropan, watiting to go back to Urologist   Hypothyroidism she is currently taking medication daily, she has chronic dry skin and constipation ( resolved with Linzes), lastTSH was  back to normal,on Synthroid 88 mcg , last TSH reviewed again   DDD lumbar spine: had MRI done, ordered by Ortho and was found to have DDD lumbar spine, seeing Dr. Sharlet Salina and had steroid injection with mild improvement of symptoms, states she has intermittent  pain radiating to lower extremities.Unchanged   Asthma mild: long history, she has noticed a dry cough and some SOB over the past week, she is on Singulair, and has resumed using Asmanex and seems to be helping with symptoms.     Patient Active Problem List   Diagnosis Date Noted  . Grade II internal hemorrhoids 05/16/2017  . Mixed stress and urge urinary incontinence 11/12/2016  . Degenerative arthritis of lumbar spine 11/12/2016  . Common migraine with intractable migraine 09/20/2016  . Iron deficiency anemia 09/12/2016  . GAD (generalized anxiety disorder) 12/20/2015  . OSA on CPAP 12/20/2015  . Angina effort 12/20/2015  . Hyperglycemia 09/22/2015  . History of colonic polyps   . Benign neoplasm of sigmoid colon   . Frequency 04/14/2015   . Backache 04/14/2015  . Chronic pain of multiple joints 06/14/2014  . Dysmetabolic syndrome 36/14/4315  . Acid reflux 06/14/2014  . Extreme obesity 06/14/2014  . Arthritis, degenerative 06/14/2014  . Morbid obesity with BMI of 40.0-44.9, adult ([place]) 06/14/2014  . Hypothyroidism due to acquired atrophy of thyroid 08/18/2013  . Hypothyroidism, unspecified 08/18/2013  . Hypertension goal BP (blood pressure) < 140/90 12/07/2010  . Familial multiple lipoprotein-type hyperlipidemia 12/07/2010  . CN (constipation) 11/21/2009  . Allergic rhinitis 10/13/2008  . Asthma, mild intermittent, well-controlled 09/01/2008  . Major depressive disorder, recurrent episode, in partial remission with anxious distress ([place]) 08/13/2007    Past Surgical History:  Procedure Laterality Date  . ABDOMINAL HYSTERECTOMY    . bladder tach  1995  . CESAREAN SECTION    . COLONOSCOPY WITH PROPOFOL N/A 05/29/2015   Procedure: COLONOSCOPY WITH PROPOFOL;  Surgeon: Lucilla Lame, MD;  Location: [facility];  Service: Endoscopy;  Laterality: N/A;  Latex allergy sleep apnea - no CPAP machine (yet)  . ESOPHAGOGASTRODUODENOSCOPY N/A 04/23/2017   Procedure: ESOPHAGOGASTRODUODENOSCOPY (EGD);  Surgeon: Lin Landsman, MD;  Location: [facility];  Service: Gastroenterology;  Laterality: N/A;  Latex allergy sleep apnea  . POLYPECTOMY  05/29/2015   Procedure: POLYPECTOMY;  Surgeon: Lucilla Lame, MD;  Location: [facility];  Service: Endoscopy;;    Family History  Problem Relation Age of Onset  . Diabetes Sister   . Hypertension Sister   . Cirrhosis Mother   . Cirrhosis Father   . Breast cancer Sister 34  . Heart attack Brother   . Prostate cancer Neg Hx   . Kidney cancer Neg Hx   . Bladder Cancer Neg Hx     Social History   Socioeconomic History  . Marital status: Married    Spouse name: Not on file  . Number of children: Not on file  . Years of education: Not on file  . Highest  education level: Not on file  Occupational History  . Not on file  Social Needs  . Financial resource strain: Not on file  . Food insecurity:    Worry: Not on file    Inability: Not on file  . Transportation needs:    Medical: Not on file    Non-medical: Not on file  Tobacco Use  . Smoking status: Never Smoker  . Smokeless tobacco: Never Used  Substance and Sexual Activity  . Alcohol use: No    Alcohol/week: 0.0 oz  . Drug use: No  . Sexual activity: Yes    Partners: Male  Lifestyle  . Physical activity:    Days per week: Not on file    Minutes per session: Not on file  . Stress: Not on file  Relationships  . Social connections:    Talks on phone: Not on file    Gets together: Not on file    Attends religious service: Not on file    Active member of club or organization: Not on file    Attends meetings of clubs or organizations: Not on file    Relationship status: Not on file  . Intimate partner violence:    Fear of current or ex partner: Not on file    Emotionally abused: Not on file    Physically abused: Not on file    Forced sexual activity: Not on file  Other Topics Concern  . Not on file  Social History Narrative   Lives with husband and one daughter other kids are out of the house   She does not work, husband on disability      Current Outpatient Medications:  .  ALPRAZolam (XANAX XR) 0.5 MG 24 hr tablet, Take 1 tablet (0.5 mg total) by mouth daily., Disp: 30 tablet, Rfl: 2 .  aspirin 81 MG EC tablet, Take 81 mg by mouth daily.  , Disp: , Rfl:  .  atorvastatin (LIPITOR) 40 MG tablet, TAKE 1 TABLET BY MOUTH AT BEDTIME. GENERIC EQUIVALENT FOR LIPITOR, Disp: 90 tablet, Rfl: 1 .  Azelastine HCl 0.15 % SOLN, Place 2 sprays into both nostrils 2 (two) times daily. , Disp: , Rfl: 3 .  conjugated estrogens (PREMARIN) vaginal cream, Place 1 Applicatorful vaginally daily. Apply 0.5mg  (pea-sized amount)  just inside the vaginal introitus with a finger-tip every night for  two weeks and then Monday, Wednesday and Friday nights., Disp: 30 g, Rfl: 12 .  cyclobenzaprine (FLEXERIL) 5 MG tablet, Take 5 mg by mouth 3 (three) times daily as needed. , Disp: , Rfl: 0 .  diclofenac (VOLTAREN) 75 MG EC tablet, TAKE  1 TABLET BY MOUTH EVERY 12 HOURS AS NEEDED, Disp: 180 tablet, Rfl: 0 .  DULoxetine (CYMBALTA) 30 MG capsule, Take 1 capsule (30 mg total) by mouth 2 (two) times daily., Disp: 14 capsule, Rfl: 0 .  EQ ALLERGY RELIEF 10 MG tablet, TAKE ONE TABLET BY MOUTH ONCE DAILY, Disp: 90 tablet, Rfl: 1 .  fluticasone (FLONASE) 50 MCG/ACT nasal spray, by Nasal route., Disp: , Rfl:  .  gabapentin (NEURONTIN) 100 MG capsule, Take 1 capsule (100 mg total) by mouth at bedtime., Disp: 90 capsule, Rfl: 1 .  levothyroxine (SYNTHROID, LEVOTHROID) 88 MCG tablet, TAKE 1 TABLET BY MOUTH DAILY BEFORE BREAKFAST. SANDOZ GENERIC ONLY.(STOP LEVOTHYROXINE 100MCG), Disp: 90 tablet, Rfl: 0 .  linaclotide (LINZESS) 145 MCG CAPS capsule, TAKE 1 CAPSULE BY MOUTH ONCE DAILY BEFORE BREAKFAST, Disp: 90 capsule, Rfl: 1 .  metoprolol succinate (TOPROL-XL) 50 MG 24 hr tablet, TAKE 1 TABLET BY MOUTH DAILY, Disp: 90 tablet, Rfl: 1 .  mometasone (ASMANEX 120 METERED DOSES) 220 MCG/INH inhaler, Inhale 2 puffs into the lungs daily., Disp: 1 Inhaler, Rfl: 12 .  montelukast (SINGULAIR) 10 MG tablet, Take 1 tablet (10 mg total) by mouth at bedtime., Disp: 90 tablet, Rfl: 1 .  nitroGLYCERIN (NITROSTAT) 0.4 MG SL tablet, Place 1 tablet (0.4 mg total) under the tongue every 5 (five) minutes as needed., Disp: 25 tablet, Rfl: 0 .  omeprazole (PRILOSEC) 40 MG capsule, Take 1 capsule (40 mg total) by mouth daily., Disp: 90 capsule, Rfl: 0 .  QUEtiapine (SEROQUEL) 100 MG tablet, Take 1 tablet (100 mg total) by mouth at bedtime. (Patient taking differently: Take 50 mg by mouth at bedtime. ), Disp: 30 tablet, Rfl: 3 .  rizatriptan (MAXALT) 10 MG tablet, Take 1 tablet (10 mg total) by mouth 3 (three) times daily as needed for  migraine., Disp: 10 tablet, Rfl: 5 .  cloNIDine (CATAPRES) 0.1 MG tablet, Take 1 tablet (0.1 mg total) by mouth 2 (two) times daily. Start at night for hot flashes and try taking bid if tolerated, Disp: 60 tablet, Rfl: 0 .  olmesartan-hydrochlorothiazide (BENICAR HCT) 40-25 MG tablet, Take 1 tablet by mouth daily., Disp: 90 tablet, Rfl: 1 No current facility-administered medications for this visit.   Facility-Administered Medications Ordered in Other Visits:  .  gadopentetate dimeglumine (MAGNEVIST) injection 20 mL, 20 mL, Intravenous, Once PRN, Ward Givens, NP  Allergies  Allergen Reactions  . Latex     Pt unsure of reaction pt states something happened while in the hospital after surgery.     ROS  Constitutional: Negative for fever or weight change.  Respiratory: positive  for cough and shortness of breath.   Cardiovascular: Negative for chest pain or palpitations.  Gastrointestinal: Negative for abdominal pain, no bowel changes.  Musculoskeletal: positive  for gait problem but no joint swelling.  Skin: Negative for rash.  Neurological: positive  for dizziness and intermittent headache.  No other specific complaints in a complete review of systems (except as listed in HPI above).  Objective  Vitals:   12/17/17 0938  BP: (!) 138/94  Pulse: 83  Resp: 16  Temp: 98.6 F (37 C)  TempSrc: Oral  SpO2: 96%  Weight: 254 lb 11.2 oz (115.5 kg)  Height: 5\' 5"  (1.651 m)    Body mass index is 42.38 kg/m.  Physical Exam  Constitutional: Patient appears well-developed and well-nourished. Obese  No distress.  HEENT: head atraumatic, normocephalic, pupils equal and reactive to light,  neck supple, throat within normal limits  Cardiovascular: Normal rate, regular rhythm and normal heart sounds.  No murmur heard. Trace  BLE edema. Pulmonary/Chest: Effort normal and breath sounds normal. No respiratory distress. Abdominal: Soft.  There is no tenderness. Psychiatric: Patient has a  normal mood and affect. behavior is normal. Judgment and thought content normal.   PHQ2/9: Depression screen [facility] 2/9 12/17/2017 08/18/2017 02/12/2017 10/07/2016 07/09/2016  Decreased Interest 0 2 0 1 1  Down, Depressed, Hopeless 1 3 0 1 1  PHQ - 2 Score 1 5 0 2 2  Altered sleeping 1 3 - 1 1  Tired, decreased energy 0 3 - 0 0  Change in appetite 1 2 - 0 0  Feeling bad or failure about yourself  1 2 - 0 0  Trouble concentrating 2 2 - 0 1  Moving slowly or fidgety/restless 2 2 - 0 0  Suicidal thoughts 2 2 - 0 -  PHQ-9 Score 10 21 - 3 4  Difficult doing work/chores Very difficult Very difficult - Somewhat difficult Somewhat difficult  Some recent data might be hidden     Fall Risk: Fall Risk  12/17/2017 08/18/2017 05/16/2017 03/27/2017 02/12/2017  Falls in the past year? Yes Yes Yes No No  Number falls in past yr: 2 or more 2 or more 1 - -  Injury with Fall? No Yes No - -  Comment - Contusion on her legs and arms - - -  Follow up - - Education provided - -      Functional Status Survey: Is the patient deaf or have difficulty hearing?: No Does the patient have difficulty seeing, even when wearing glasses/contacts?: Yes(prescription glasses) Does the patient have difficulty concentrating, remembering, or making decisions?: Yes Does the patient have difficulty walking or climbing stairs?: Yes(uses a walker) Does the patient have difficulty dressing or bathing?: No Does the patient have difficulty doing errands alone such as visiting a doctor's office or shopping?: Yes    Assessment & Plan  1. Hyperlipidemia with target LDL less than 100  - atorvastatin (LIPITOR) 40 MG tablet; TAKE 1 TABLET BY MOUTH AT BEDTIME. GENERIC EQUIVALENT FOR LIPITOR  Dispense: 90 tablet; Refill: 1  2. GAD (generalized anxiety disorder)  - ALPRAZolam (XANAX XR) 0.5 MG 24 hr tablet; Take 1 tablet (0.5 mg total) by mouth daily.  Dispense: 30 tablet; Refill: 2  3. Hypertension goal BP (blood pressure) <  140/90  - metoprolol succinate (TOPROL-XL) 50 MG 24 hr tablet; TAKE 1 TABLET BY MOUTH DAILY  Dispense: 90 tablet; Refill: 1 - olmesartan-hydrochlorothiazide (BENICAR HCT) 40-25 MG tablet; Take 1 tablet by mouth daily.  Dispense: 90 tablet; Refill: 1  4. Gastroesophageal reflux disease without esophagitis  - omeprazole (PRILOSEC) 40 MG capsule; Take 1 capsule (40 mg total) by mouth daily.  Dispense: 90 capsule; Refill: 0  5. Chronic constipation  - linaclotide (LINZESS) 145 MCG CAPS capsule; TAKE 1 CAPSULE BY MOUTH ONCE DAILY BEFORE BREAKFAST  Dispense: 90 capsule; Refill: 1  6. Asthma, mild intermittent, not controlled   - montelukast (SINGULAIR) 10 MG tablet; Take 1 tablet (10 mg total) by mouth at bedtime.  Dispense: 90 tablet; Refill: 1  7. Hot flashes  - cloNIDine (CATAPRES) 0.1 MG tablet; Take 1 tablet (0.1 mg total) by mouth 2 (two) times daily. Start at night for hot flashes and try taking bid if tolerated  Dispense: 60 tablet; Refill: 0  8. Depression, major, recurrent, moderate ([place])  - Ambulatory referral to Psychiatry Used to see Dr. Kasandra Knudsen, but no  longer on her plan  Under more stress, son wrecked their car  9. Morbid obesity with BMI of 40.0-44.9, adult [facility])  Discussed with the patient the risk posed by an increased BMI. Discussed importance of portion control, calorie counting and at least 150 minutes of physical activity weekly. Avoid sweet beverages and drink more water. Eat at least 6 servings of fruit and vegetables daily   10. Hypothyroidism, adult  Last TSH at goal   11. Dizziness  Usually when bp not controlled, we will adjust medications and monitor

## 2017-12-31 ENCOUNTER — Other Ambulatory Visit: Payer: Self-pay | Admitting: Adult Health

## 2018-01-01 ENCOUNTER — Other Ambulatory Visit: Payer: Self-pay | Admitting: Adult Health

## 2018-01-03 ENCOUNTER — Other Ambulatory Visit: Payer: Self-pay | Admitting: Family Medicine

## 2018-01-21 ENCOUNTER — Encounter: Payer: Self-pay | Admitting: Psychiatry

## 2018-01-21 ENCOUNTER — Other Ambulatory Visit: Payer: Self-pay

## 2018-01-21 ENCOUNTER — Ambulatory Visit (INDEPENDENT_AMBULATORY_CARE_PROVIDER_SITE_OTHER): Payer: BLUE CROSS/BLUE SHIELD | Admitting: Psychiatry

## 2018-01-21 VITALS — BP 131/76 | HR 78 | Temp 97.9°F | Wt 259.4 lb

## 2018-01-21 DIAGNOSIS — F411 Generalized anxiety disorder: Secondary | ICD-10-CM

## 2018-01-21 DIAGNOSIS — F331 Major depressive disorder, recurrent, moderate: Secondary | ICD-10-CM | POA: Diagnosis not present

## 2018-01-21 MED ORDER — HYDROXYZINE HCL 25 MG PO TABS: 25.0000 mg | ORAL_TABLET | Freq: Three times a day (TID) | ORAL | 0 refills | Status: DC | PRN

## 2018-01-21 MED ORDER — DULOXETINE HCL 30 MG PO CPEP: 30.0000 mg | ORAL_CAPSULE | Freq: Two times a day (BID) | ORAL | 0 refills | Status: DC

## 2018-01-21 MED ORDER — QUETIAPINE FUMARATE 100 MG PO TABS: 100.0000 mg | ORAL_TABLET | Freq: Every day | ORAL | 0 refills | Status: DC

## 2018-01-21 NOTE — Progress Notes (Signed)
Psychiatric Initial Adult Assessment   Patient Identification: Beverly Barrett MRN:  [id number] Date of Evaluation:  01/21/2018 Referral Source:Dr.Su ( [facility]) Chief Complaint:  ' I am here to establish care." Chief Complaint    Establish Care; Depression; Stress     Visit Diagnosis:    ICD-10-CM   1. MDD (major depressive disorder), recurrent episode, moderate (HCC) F33.1   2. GAD (generalized anxiety disorder) F41.1     History of Present Illness:  Beverly Barrett is a 57 year old African-American female, married, lives in [place], has a history of depression, anxiety, chronic pain, arthritis, OSA on CPAP, hypothyroidism, presented to the clinic today to establish care.  Patient reports she used to follow up with Dr. Kasandra Knudsen at [facility] in the past.  Her provider is currently out of network.  She reports her medications were being prescribed by her PMD most recently.  She describes her depressive symptoms as getting worse since the past few months.  She reports tearfulness, crying spells, trouble concentration, sleep problems and so on on a regular basis.  She reports she used to be on Cymbalta which was working well but she has been compliant with it since the past few weeks.  She reports she was never given a prescription by her other provider and she does not really know what happened.  She denies having any side effects to the medication and reports she liked the medication.  Patient reports a history of paranoia.  She reports there are times when she is out in public and she feels people are looking at her.  She denies any other perceptual disturbances.  Patient reports she is a Research officer, trade [employer] ,she worries about everything to the extreme.  She reports her anxiety symptoms as getting worse since the past several months. She also reports some panic symptoms when she feels her heart racing at times.  She reports she takes Xanax 4 times a week for panic attacks and has been on it since the past several years.   She reports several stressors including financial problems, being denied disability so on.  Patient reports sleep problems.  She reports she is on Seroquel which helps to some extent.  Patient denies any suicidality.  Patient denies any homicidality.  Patient reports her children are supportive.  She lives with her husband and her 83 year old daughter lives with her.  She reports her 89 year old daughter who lives with her supports her financially also.  Patient became tearful when she discussed that stating that she does not want to depend on her children financially.  Associated Signs/Symptoms: Depression Symptoms:  depressed mood, insomnia, difficulty concentrating, anxiety, panic attacks, (Hypo) Manic Symptoms:  Delusions, Anxiety Symptoms:  Excessive Worry, Psychotic Symptoms:  Paranoia, PTSD Symptoms: Negative  Past Psychiatric History: Pt used to follow up with Dr.Su at [facility].  Most recently her medications were being prescribed by her primary medical doctor.  Patient denies inpatient mental health admissions.  Patient denies any suicide attempts.  Previous Psychotropic Medications: Yes -Seroquel., Cymbalta, Xanax  Substance Abuse History in the last 12 months:  No.  Consequences of Substance Abuse: Negative  Past Medical History:  Past Medical History:  Diagnosis Date  . Anginal pain ([place])    none in approx 2 yrs  . Anxiety   . Asthma   . Chronic lower back pain   . Common migraine with intractable migraine 09/20/2016  . Depression    suicidal ideation  . GERD (gastroesophageal reflux disease)   . Headache    migraines -  3x/wk  . Hyperlipidemia   . Hypertension   . Hypothyroidism   . Motion sickness    cars  . Multilevel degenerative disc disease   . Shortness of breath dyspnea   . Sleep apnea    CPAP  . Vertigo   . Wears dentures    partial upper    Past Surgical History:  Procedure Laterality Date  . ABDOMINAL HYSTERECTOMY    . bladder tach  1995  .  CESAREAN SECTION    . COLONOSCOPY WITH PROPOFOL N/A 05/29/2015   Procedure: COLONOSCOPY WITH PROPOFOL;  Surgeon: Lucilla Lame, MD;  Location: [place];  Service: Endoscopy;  Laterality: N/A;  Latex allergy sleep apnea - no CPAP machine (yet)  . ESOPHAGOGASTRODUODENOSCOPY N/A 04/23/2017   Procedure: ESOPHAGOGASTRODUODENOSCOPY (EGD);  Surgeon: Lin Landsman, MD;  Location: [place];  Service: Gastroenterology;  Laterality: N/A;  Latex allergy sleep apnea  . POLYPECTOMY  05/29/2015   Procedure: POLYPECTOMY;  Surgeon: Lucilla Lame, MD;  Location: [place];  Service: Endoscopy;;    Family Psychiatric History: Patient reports her sister has mental health problems, brother-mental health problems, drug abuse.  Family History:  Family History  Problem Relation Age of Onset  . Diabetes Sister   . Anxiety disorder Sister   . Depression Sister   . Hypertension Sister   . Cirrhosis Mother   . Cirrhosis Father   . Breast cancer Sister 45  . Heart attack Brother   . Alcohol abuse Brother   . Drug abuse Brother   . Prostate cancer Neg Hx   . Kidney cancer Neg Hx   . Bladder Cancer Neg Hx     Social History:   Social History   Socioeconomic History  . Marital status: Married    Spouse name: clayton sr  . Number of children: 3  . Years of education: Not on file  . Highest education level: GED or equivalent  Occupational History  . Not on file  Social Needs  . Financial resource strain: Very hard  . Food insecurity:    Worry: Often true    Inability: Often true  . Transportation needs:    Medical: No    Non-medical: No  Tobacco Use  . Smoking status: Never Smoker  . Smokeless tobacco: Never Used  Substance and Sexual Activity  . Alcohol use: No    Alcohol/week: 0.0 oz  . Drug use: No  . Sexual activity: Yes    Partners: Male  Lifestyle  . Physical activity:    Days per week: 2 days    Minutes per session: 30 min  . Stress: Very much   Relationships  . Social connections:    Talks on phone: Three times a week    Gets together: Once a week    Attends religious service: More than 4 times per year    Active member of club or organization: No    Attends meetings of clubs or organizations: Never    Relationship status: Married  Other Topics Concern  . Not on file  Social History Narrative   Lives with husband and one daughter other kids are out of the house   She does not work, husband on disability     Additional Social History: Patient is unemployed.  She is married.  She lives with her husband and her daughter.  She has applied for disability but has been denied.  Her husband is currently on disability.  She reports her daughter is supportive.  She has 2 other older children.  Allergies:   Allergies  Allergen Reactions  . Latex     Pt unsure of reaction pt states something happened while in the hospital after surgery.    Metabolic Disorder Labs: Lab Results  Component Value Date   HGBA1C 6.6 (H) 08/18/2017   MPG 143 08/18/2017   MPG 128 02/12/2017   No results found for: PROLACTIN Lab Results  Component Value Date   CHOL 172 08/18/2017   TRIG 196 (H) 08/18/2017   HDL 44 (L) 08/18/2017   CHOLHDL 3.9 08/18/2017   VLDL 44 (H) 02/12/2017   LDLCALC 99 08/18/2017   LDLCALC 78 02/12/2017     Current Medications: Current Outpatient Medications  Medication Sig Dispense Refill  . ALPRAZolam (XANAX XR) 0.5 MG 24 hr tablet Take 1 tablet (0.5 mg total) by mouth daily. 30 tablet 2  . aspirin 81 MG EC tablet Take 81 mg by mouth daily.      Marland Kitchen atorvastatin (LIPITOR) 40 MG tablet TAKE 1 TABLET BY MOUTH AT BEDTIME. GENERIC EQUIVALENT FOR LIPITOR 90 tablet 1  . Azelastine HCl 0.15 % SOLN Place 2 sprays into both nostrils 2 (two) times daily.   3  . cloNIDine (CATAPRES) 0.1 MG tablet Take 1 tablet (0.1 mg total) by mouth 2 (two) times daily. Start at night for hot flashes and try taking bid if tolerated 60 tablet 0  .  conjugated estrogens (PREMARIN) vaginal cream Place 1 Applicatorful vaginally daily. Apply 0.5mg  (pea-sized amount)  just inside the vaginal introitus with a finger-tip every night for two weeks and then Monday, Wednesday and Friday nights. 30 g 12  . cyclobenzaprine (FLEXERIL) 5 MG tablet Take 5 mg by mouth 3 (three) times daily as needed.   0  . diclofenac (VOLTAREN) 75 MG EC tablet TAKE 1 TABLET BY MOUTH EVERY 12 HOURS AS NEEDED 180 tablet 0  . DULoxetine (CYMBALTA) 30 MG capsule Take 1 capsule (30 mg total) by mouth 2 (two) times daily. 180 capsule 0  . EQ ALLERGY RELIEF 10 MG tablet TAKE ONE TABLET BY MOUTH ONCE DAILY 90 tablet 1  . fluticasone (FLONASE) 50 MCG/ACT nasal spray by Nasal route.    . gabapentin (NEURONTIN) 100 MG capsule TAKE ONE CAPSULE BY MOUTH AT BEDTIME 90 capsule 0  . levothyroxine (SYNTHROID, LEVOTHROID) 88 MCG tablet TAKE 1 TABLET BY MOUTH DAILY BEFORE BREAKFAST. SANDOZ GENERIC ONLY.(STOP LEVOTHYROXINE 100MCG) 90 tablet 0  . linaclotide (LINZESS) 145 MCG CAPS capsule TAKE 1 CAPSULE BY MOUTH ONCE DAILY BEFORE BREAKFAST 90 capsule 1  . metoprolol succinate (TOPROL-XL) 50 MG 24 hr tablet TAKE 1 TABLET BY MOUTH DAILY 90 tablet 1  . mometasone (ASMANEX 120 METERED DOSES) 220 MCG/INH inhaler Inhale 2 puffs into the lungs daily. 1 Inhaler 12  . montelukast (SINGULAIR) 10 MG tablet Take 1 tablet (10 mg total) by mouth at bedtime. 90 tablet 1  . nitroGLYCERIN (NITROSTAT) 0.4 MG SL tablet Place 1 tablet (0.4 mg total) under the tongue every 5 (five) minutes as needed. 25 tablet 0  . olmesartan-hydrochlorothiazide (BENICAR HCT) 40-25 MG tablet Take 1 tablet by mouth daily. 90 tablet 1  . omeprazole (PRILOSEC) 40 MG capsule Take 1 capsule (40 mg total) by mouth daily. 90 capsule 0  . QUEtiapine (SEROQUEL) 100 MG tablet Take 1 tablet (100 mg total) by mouth at bedtime. 90 tablet 0  . rizatriptan (MAXALT) 10 MG tablet Take 1 tablet (10 mg total) by mouth 3 (three) times daily as  needed  for migraine. 10 tablet 5  . hydrOXYzine (ATARAX/VISTARIL) 25 MG tablet Take 1 tablet (25 mg total) by mouth 3 (three) times daily as needed (for severe anxiety only). 270 tablet 0   No current facility-administered medications for this visit.    Facility-Administered Medications Ordered in Other Visits  Medication Dose Route Frequency Provider Last Rate Last Dose  . gadopentetate dimeglumine (MAGNEVIST) injection 20 mL  20 mL Intravenous Once PRN Ward Givens, NP        Neurologic: Headache: No Seizure: No Paresthesias:No  Musculoskeletal: Strength & Muscle Tone: within normal limits Gait & Station: normal Patient leans: N/A  Psychiatric Specialty Exam: Review of Systems  Psychiatric/Behavioral: Positive for depression. The patient is nervous/anxious and has insomnia.   All other systems reviewed and are negative.   Blood pressure 131/76, pulse 78, temperature 97.9 F (36.6 C), temperature source Oral, weight 259 lb 6.4 oz (117.7 kg).Body mass index is 43.17 kg/m.  General Appearance: Casual  Eye Contact:  Fair  Speech:  Clear and Coherent  Volume:  Normal  Mood:  Anxious and Depressed  Affect:  Congruent  Thought Process:  Goal Directed and Descriptions of Associations: Intact  Orientation:  Full (Time, Place, and Person)  Thought Content:  Logical  Suicidal Thoughts:  No  Homicidal Thoughts:  No  Memory:  Immediate;   Fair Recent;   Fair Remote;   Fair  Judgement:  Fair  Insight:  Fair  Psychomotor Activity:  Normal  Concentration:  Concentration: Fair and Attention Span: Fair  Recall:  [employer] of Knowledge:Fair  Language: Fair  Akathisia:  No  Handed:  Right  AIMS (if indicated):  Denies tremors, rigidity, stiffness  Assets:  Communication Skills Desire for Improvement Social Support  ADL's:  Intact  Cognition: WNL  Sleep:  poor    Treatment Plan Summary:Srihitha is a 57 year old African-American female who is married, unemployed, lives in  [place], has a history of depression, anxiety, insomnia, OSA on CPAP, multiple medical problems like hypothyroidism, arthritis and so on presented to the clinic today to establish care.  Patient is currently noncompliant with her medications.  She reports worsening depressive symptoms and anxiety symptoms as well as multiple psychosocial stressors.  She however is motivated to restart medications as well as pursue psychotherapy.  She has good social support from her husband and her children.  Plan as noted below. Medication management and Plan As noted below  Plan MDD, recurrent moderate Restart Cymbalta 30 mg p.o. twice daily PHQ 9 equals 10 Increase Seroquel to 100 mg p.o. nightly.  For GAD Cymbalta 30 mg p.o. twice daily Discussed to taper of Xanax gradually.  Discussed with her to take it 3 days a week instead of 4 days. She can make use of hydroxyzine 25 mg as needed for anxiety attacks. We will refer for CBT.  I have reviewed the following labs in [facility] R-TSH, hemoglobin A1c, lipid panel- 08/18/2017.  Patient will continue to follow-up with primary medical doctor for her abnormal labs.  She also has hypothyroidism which is currently being managed by her PMD. EKG - Reviewed- 02/20/2017 - qtc - wnl.  I reviewed medical records in [facility] R Per Dr.Su.  Follow-up in clinic in 6 weeks.  More than 50 % of the time was spent for psychoeducation and supportive psychotherapy and care coordination.  This note was generated in part or whole with voice recognition software. Voice recognition is usually quite accurate but there are transcription errors that  can and very often do occur. I apologize for any typographical errors that were not detected and corrected.     Ursula Alert, MD 6/26/201912:44 PM

## 2018-01-21 NOTE — Patient Instructions (Signed)
Please try to limit use of xanax. Start taking it 3 days a week instead of 4 days. You may use Hydroxyzine as needed for severe anxiety attacks.

## 2018-01-23 ENCOUNTER — Other Ambulatory Visit: Payer: Self-pay | Admitting: Family Medicine

## 2018-01-23 DIAGNOSIS — R232 Flushing: Secondary | ICD-10-CM

## 2018-01-23 NOTE — Telephone Encounter (Signed)
Refill request for general medication. Clonidine   Last office visit 12/17/2017   Follow up on 03/19/2018

## 2018-01-24 ENCOUNTER — Other Ambulatory Visit: Payer: Self-pay | Admitting: Family Medicine

## 2018-01-24 DIAGNOSIS — R232 Flushing: Secondary | ICD-10-CM

## 2018-01-30 ENCOUNTER — Ambulatory Visit: Payer: BLUE CROSS/BLUE SHIELD | Admitting: Neurology

## 2018-02-14 ENCOUNTER — Other Ambulatory Visit: Payer: Self-pay | Admitting: Family Medicine

## 2018-02-14 DIAGNOSIS — E034 Atrophy of thyroid (acquired): Secondary | ICD-10-CM

## 2018-02-24 ENCOUNTER — Other Ambulatory Visit: Payer: Self-pay | Admitting: Family Medicine

## 2018-02-24 DIAGNOSIS — N952 Postmenopausal atrophic vaginitis: Secondary | ICD-10-CM

## 2018-02-24 MED ORDER — ESTROGENS, CONJUGATED 0.625 MG/GM VA CREA: 1.0000 | TOPICAL_CREAM | Freq: Every day | VAGINAL | 12 refills | Status: DC

## 2018-02-25 ENCOUNTER — Encounter: Payer: Self-pay | Admitting: Neurology

## 2018-02-25 ENCOUNTER — Ambulatory Visit: Payer: BLUE CROSS/BLUE SHIELD | Admitting: Neurology

## 2018-02-25 VITALS — BP 140/83 | HR 63 | Ht 65.0 in | Wt 260.0 lb

## 2018-02-25 DIAGNOSIS — G43019 Migraine without aura, intractable, without status migrainosus: Secondary | ICD-10-CM | POA: Diagnosis not present

## 2018-02-25 MED ORDER — GABAPENTIN 300 MG PO CAPS: ORAL_CAPSULE | ORAL | 3 refills | Status: DC

## 2018-02-25 MED ORDER — DICLOFENAC POTASSIUM 50 MG PO TABS: 50.0000 mg | ORAL_TABLET | Freq: Three times a day (TID) | ORAL | 2 refills | Status: DC | PRN

## 2018-02-25 MED ORDER — RIZATRIPTAN BENZOATE 10 MG PO TABS: 10.0000 mg | ORAL_TABLET | Freq: Three times a day (TID) | ORAL | 5 refills | Status: DC | PRN

## 2018-02-25 NOTE — Patient Instructions (Signed)
We will go up on the gabapentin to 300 mg twice a day for 2 weeks, then take one in the morning and 2 in the evening.  Call if the headaches are not getting better.  Neurontin (gabapentin) may result in drowsiness, ankle swelling, gait instability, or possibly dizziness. Please contact our office if significant side effects occur with this medication.

## 2018-02-25 NOTE — Progress Notes (Signed)
I called [facility]. They stated patient refilled rx maxalt on the following dates: called [facility] and  09/27/17 10 tabs, 10/10/17 8 tabs, 10/27/17 8 tabs, 11/07/17 8 tabs, 12/02/17 8 tabs , 12/19/17 8 tabs, 12/30/17 8 tablets.

## 2018-02-25 NOTE — Progress Notes (Signed)
Reason for visit: Migraine headache  Beverly Barrett is an 57 y.o. female  History of present illness:  Beverly Barrett is a 57 year old right-handed black female with a history of morbid obesity and intractable migraine.  The patient has been on Seroquel 100 mg at night, she was to taper off of Cymbalta as she felt that this may have worsened her migraine.  Her primary doctor however wants her back on the Cymbalta and the headaches have continued, they have become daily at this point.  The patient is on very low-dose gabapentin taking 100 mg at night.  She is on metoprolol.  In the past she has taken Topamax without benefit.  She returns to this office for an evaluation.  She is incapacitated with her headaches 2 to 3 days of the month.  She does have some neck stiffness with the headache.  She claims that she is able to sleep fairly well.  Past Medical History:  Diagnosis Date  . Anginal pain ([place])    none in approx 2 yrs  . Anxiety   . Asthma   . Chronic lower back pain   . Common migraine with intractable migraine 09/20/2016  . Depression    suicidal ideation  . GERD (gastroesophageal reflux disease)   . Headache    migraines -3x/wk  . Hyperlipidemia   . Hypertension   . Hypothyroidism   . Motion sickness    cars  . Multilevel degenerative disc disease   . Shortness of breath dyspnea   . Sleep apnea    CPAP  . Vertigo   . Wears dentures    partial upper    Past Surgical History:  Procedure Laterality Date  . ABDOMINAL HYSTERECTOMY    . bladder tach  1995  . CESAREAN SECTION    . COLONOSCOPY WITH PROPOFOL N/A 05/29/2015   Procedure: COLONOSCOPY WITH PROPOFOL;  Surgeon: Lucilla Lame, MD;  Location: [place];  Service: Endoscopy;  Laterality: N/A;  Latex allergy sleep apnea - no CPAP machine (yet)  . ESOPHAGOGASTRODUODENOSCOPY N/A 04/23/2017   Procedure: ESOPHAGOGASTRODUODENOSCOPY (EGD);  Surgeon: Lin Landsman, MD;  Location: [place];   Service: Gastroenterology;  Laterality: N/A;  Latex allergy sleep apnea  . POLYPECTOMY  05/29/2015   Procedure: POLYPECTOMY;  Surgeon: Lucilla Lame, MD;  Location: [place];  Service: Endoscopy;;    Family History  Problem Relation Age of Onset  . Diabetes Sister   . Anxiety disorder Sister   . Depression Sister   . Hypertension Sister   . Cirrhosis Mother   . Cirrhosis Father   . Breast cancer Sister 7  . Heart attack Brother   . Alcohol abuse Brother   . Drug abuse Brother   . Prostate cancer Neg Hx   . Kidney cancer Neg Hx   . Bladder Cancer Neg Hx     Social history:  reports that she has never smoked. She has never used smokeless tobacco. She reports that she does not drink alcohol or use drugs.    Allergies  Allergen Reactions  . Latex     Pt unsure of reaction pt states something happened while in the hospital after surgery.    Medications:  Prior to Admission medications   Medication Sig Start Date End Date Taking? Authorizing Provider  ALPRAZolam (XANAX XR) 0.5 MG 24 hr tablet Take 1 tablet (0.5 mg total) by mouth daily. 12/17/17  Yes Steele Sizer, MD  aspirin 81 MG EC tablet Take 81  mg by mouth daily.     Yes [provider]  atorvastatin (LIPITOR) 40 MG tablet TAKE 1 TABLET BY MOUTH AT BEDTIME. GENERIC EQUIVALENT FOR LIPITOR 12/17/17  Yes Sowles, Drue Stager, MD  Azelastine HCl 0.15 % SOLN Place 2 sprays into both nostrils 2 (two) times daily.  01/14/15  Yes [provider]  cloNIDine (CATAPRES) 0.1 MG tablet TAKE 1 TABLET(0.1MG  TOTAL) BY MOUTH 2 TIMES DAILY. START ATNIGHT FOR HOT FLASHES AND TRY TAKING TWICE DAILY IF TOLERATED 01/25/18  Yes Sowles, Drue Stager, MD  conjugated estrogens (PREMARIN) vaginal cream Place 1 Applicatorful vaginally daily. Apply 0.5mg  (pea-sized amount)  just inside the vaginal introitus with a finger-tip every night for two weeks and then Monday, Wednesday and Friday nights. 02/24/18  Yes Hollice Espy, MD    cyclobenzaprine (FLEXERIL) 5 MG tablet Take 5 mg by mouth 3 (three) times daily as needed.  04/18/17  Yes Duanne Guess, PA-C  diclofenac (VOLTAREN) 75 MG EC tablet TAKE 1 TABLET BY MOUTH EVERY 12 HOURS AS NEEDED 12/30/16  Yes Hubbard Hartshorn, FNP  DULoxetine (CYMBALTA) 30 MG capsule Take 1 capsule (30 mg total) by mouth 2 (two) times daily. 01/21/18  Yes Ursula Alert, MD  EQ ALLERGY RELIEF 10 MG tablet TAKE ONE TABLET BY MOUTH ONCE DAILY 07/25/15  Yes Bobetta Lime, MD  fluticasone (FLONASE) 50 MCG/ACT nasal spray by Nasal route. 04/11/14  Yes [provider]  hydrOXYzine (ATARAX/VISTARIL) 25 MG tablet Take 1 tablet (25 mg total) by mouth 3 (three) times daily as needed (for severe anxiety only). 01/21/18  Yes Eappen, Ria Clock, MD  levothyroxine (SYNTHROID, LEVOTHROID) 88 MCG tablet TAKE 1 TABLET BY MOUTH DAILY BEFORE BREAKFAST. SANDOZ GENERIC ONLY.(STOP LEVOTHYROXINE 100MCG) 02/14/18  Yes Sowles, Drue Stager, MD  linaclotide [facility]) 145 MCG CAPS capsule TAKE 1 CAPSULE BY MOUTH ONCE DAILY BEFORE BREAKFAST 12/17/17  Yes Sowles, Drue Stager, MD  metoprolol succinate (TOPROL-XL) 50 MG 24 hr tablet TAKE 1 TABLET BY MOUTH DAILY 12/17/17  Yes Sowles, Drue Stager, MD  mometasone (ASMANEX 120 METERED DOSES) 220 MCG/INH inhaler Inhale 2 puffs into the lungs daily. 08/18/17  Yes Sowles, Drue Stager, MD  montelukast (SINGULAIR) 10 MG tablet Take 1 tablet (10 mg total) by mouth at bedtime. 12/17/17  Yes Sowles, Drue Stager, MD  nitroGLYCERIN (NITROSTAT) 0.4 MG SL tablet Place 1 tablet (0.4 mg total) under the tongue every 5 (five) minutes as needed. 02/20/17  Yes Gollan, Kathlene November, MD  olmesartan-hydrochlorothiazide (BENICAR HCT) 40-25 MG tablet Take 1 tablet by mouth daily. 12/17/17  Yes Sowles, Drue Stager, MD  omeprazole (PRILOSEC) 40 MG capsule Take 1 capsule (40 mg total) by mouth daily. 12/17/17  Yes Sowles, Drue Stager, MD  QUEtiapine (SEROQUEL) 100 MG tablet Take 1 tablet (100 mg total) by mouth at bedtime. 01/21/18  Yes  Ursula Alert, MD  rizatriptan (MAXALT) 10 MG tablet Take 1 tablet (10 mg total) by mouth 3 (three) times daily as needed for migraine. 02/25/18  Yes Kathrynn Ducking, MD  diclofenac (CATAFLAM) 50 MG tablet Take 1 tablet (50 mg total) by mouth 3 (three) times daily as needed. 02/25/18   Kathrynn Ducking, MD  gabapentin (NEURONTIN) 300 MG capsule One capsule twice a day for 2 weeks, then take one in the morning and 2 in the evening. 02/25/18   Kathrynn Ducking, MD    ROS:  Out of a complete 14 system review of symptoms, the patient complains only of the following symptoms, and all other reviewed systems are negative.  Decreased activity, increased  appetite Ear pain Shortness of breath Chest pain, leg swelling Heat intolerance, excessive thirst Abdominal pain, nausea Sleep apnea Frequency of urination Joint pain, back pain Dizziness, headache Depression  Blood pressure 140/83, pulse 63, height 5\' 5"  (1.651 m), weight 260 lb (117.9 kg).  Physical Exam  General: The patient is alert and cooperative at the time of the examination.  The patient is markedly obese.  Neuromuscular: Range of movement the cervical spine is full.  Skin: No significant peripheral edema is noted.   Neurologic Exam  Mental status: The patient is alert and oriented x 3 at the time of the examination. The patient has apparent normal recent and remote memory, with an apparently normal attention span and concentration ability.   Cranial nerves: Facial symmetry is present. Speech is normal, no aphasia or dysarthria is noted. Extraocular movements are full. Visual fields are full.  Motor: The patient has good strength in all 4 extremities.  Sensory examination: Soft touch sensation is symmetric on the face, arms, and legs.  Coordination: The patient has good finger-nose-finger and heel-to-shin bilaterally.  Gait and station: The patient has a normal gait. Tandem gait is slightly unsteady. Romberg is  negative. No drift is seen.  Reflexes: Deep tendon reflexes are symmetric.   Assessment/Plan:  1.  Intractable migraine  The patient is having ongoing headaches that have increased in frequency, currently they are daily in nature.  The patient remains on Seroquel and Cymbalta, we will increase the gabapentin dose taking 300 mg twice daily for 2 weeks and then go to 300 mg in the morning and 600 mg in the evening.  The patient will call for any dose adjustments.  The patient will be given diclofenac potassium to take if needed for the headaches to reduce the amount of Maxalt use.  The Maxalt does help the headache.  The patient will be seen by her optometrist in the near future.  She will follow-up in 3 months.  If the headaches are not improving after 6 weeks, she will call our office and we will consider initiation of Botox therapy.  Jill Alexanders MD 02/25/2018 3:52 PM  [facility] [address] [place], [place] [address]  Phone [phone] Fax [phone]

## 2018-03-02 ENCOUNTER — Ambulatory Visit: Payer: BLUE CROSS/BLUE SHIELD | Admitting: Licensed Clinical Social Worker

## 2018-03-04 ENCOUNTER — Ambulatory Visit: Payer: BLUE CROSS/BLUE SHIELD | Admitting: Psychiatry

## 2018-03-04 ENCOUNTER — Encounter: Payer: Self-pay | Admitting: Psychiatry

## 2018-03-04 ENCOUNTER — Ambulatory Visit (INDEPENDENT_AMBULATORY_CARE_PROVIDER_SITE_OTHER): Payer: BLUE CROSS/BLUE SHIELD | Admitting: Psychiatry

## 2018-03-04 VITALS — BP 139/84 | HR 80 | Ht 65.0 in | Wt 262.0 lb

## 2018-03-04 DIAGNOSIS — F331 Major depressive disorder, recurrent, moderate: Secondary | ICD-10-CM

## 2018-03-04 DIAGNOSIS — F411 Generalized anxiety disorder: Secondary | ICD-10-CM

## 2018-03-04 NOTE — Progress Notes (Signed)
[facility] MD OP Progress Note  03/04/2018 2:23 PM Beverly Barrett  MRN:  [id number]  Chief Complaint: ' I am here for follow up." Chief Complaint    Follow-up     HPI: Beverly Barrett is a 57 year old African-American female, married, lives in [place], has a history of depression, anxiety, chronic pain, arthritis, OSA on CPAP, hypothyroidism, presented to the clinic today for a follow-up visit.  Patient reports that she is currently doing better on the medication regimen.  She is tolerating the Cymbalta well.  She reports improvement in her depression and anxiety symptoms.  She she also reports improvement in her sleep with the Seroquel.  She sleeps 7-8 hours at night and feels rested when she wakes up in the morning.  She denies any significant tearfulness, crying spells, trouble with concentration and so on.  Patient reports she has been cutting back on the Xanax and does not use it as much.  She reports she has been making use of the hydroxyzine as needed and it is helpful.  She denies any side effects to the hydroxyzine.  Patient denies any suicidality.  Patient denies any perceptual disturbances.  Patient denies any homicidality.  Patient will start psychotherapy with Ms. Peacock soon. Visit Diagnosis:    ICD-10-CM   1. MDD (major depressive disorder), recurrent episode, moderate (HCC) F33.1   2. GAD (generalized anxiety disorder) F41.1     Past Psychiatric History: Reviewed past psychiatric history from my progress note on 01/21/2018.  Past trials of Seroquel, Cymbalta, Xanax.  Past Medical History:  Past Medical History:  Diagnosis Date  . Anginal pain ([place])    none in approx 2 yrs  . Anxiety   . Asthma   . Chronic lower back pain   . Common migraine with intractable migraine 09/20/2016  . Depression    suicidal ideation  . GERD (gastroesophageal reflux disease)   . Headache    migraines -3x/wk  . Hyperlipidemia   . Hypertension   . Hypothyroidism   . Motion sickness    cars   . Multilevel degenerative disc disease   . Shortness of breath dyspnea   . Sleep apnea    CPAP  . Vertigo   . Wears dentures    partial upper    Past Surgical History:  Procedure Laterality Date  . ABDOMINAL HYSTERECTOMY    . bladder tach  1995  . CESAREAN SECTION    . COLONOSCOPY WITH PROPOFOL N/A 05/29/2015   Procedure: COLONOSCOPY WITH PROPOFOL;  Surgeon: Lucilla Lame, MD;  Location: [facility];  Service: Endoscopy;  Laterality: N/A;  Latex allergy sleep apnea - no CPAP machine (yet)  . ESOPHAGOGASTRODUODENOSCOPY N/A 04/23/2017   Procedure: ESOPHAGOGASTRODUODENOSCOPY (EGD);  Surgeon: Lin Landsman, MD;  Location: [facility];  Service: Gastroenterology;  Laterality: N/A;  Latex allergy sleep apnea  . POLYPECTOMY  05/29/2015   Procedure: POLYPECTOMY;  Surgeon: Lucilla Lame, MD;  Location: [facility];  Service: Endoscopy;;    Family Psychiatric History: Reviewed Family psychiatric history from my progress note on 01/21/2018  Family History:  Family History  Problem Relation Age of Onset  . Diabetes Sister   . Anxiety disorder Sister   . Depression Sister   . Hypertension Sister   . Cirrhosis Mother   . Cirrhosis Father   . Breast cancer Sister 6  . Heart attack Brother   . Alcohol abuse Brother   . Drug abuse Brother   . Prostate cancer Neg Hx   . Kidney  cancer Neg Hx   . Bladder Cancer Neg Hx     Social History: Reviewed social history from my progress note on 01/21/2018 Social History   Socioeconomic History  . Marital status: Married    Spouse name: clayton sr  . Number of children: 3  . Years of education: Not on file  . Highest education level: GED or equivalent  Occupational History  . Not on file  Social Needs  . Financial resource strain: Very hard  . Food insecurity:    Worry: Often true    Inability: Often true  . Transportation needs:    Medical: No    Non-medical: No  Tobacco Use  . Smoking status: Never Smoker   . Smokeless tobacco: Never Used  Substance and Sexual Activity  . Alcohol use: No    Alcohol/week: 0.0 oz  . Drug use: No  . Sexual activity: Yes    Partners: Male  Lifestyle  . Physical activity:    Days per week: 2 days    Minutes per session: 30 min  . Stress: Very much  Relationships  . Social connections:    Talks on phone: Three times a week    Gets together: Once a week    Attends religious service: More than 4 times per year    Active member of club or organization: No    Attends meetings of clubs or organizations: Never    Relationship status: Married  Other Topics Concern  . Not on file  Social History Narrative   Lives with husband and one daughter other kids are out of the house   She does not work, husband on disability     Allergies:  Allergies  Allergen Reactions  . Latex     Pt unsure of reaction pt states something happened while in the hospital after surgery.    Metabolic Disorder Labs: Lab Results  Component Value Date   HGBA1C 6.6 (H) 08/18/2017   MPG 143 08/18/2017   MPG 128 02/12/2017   No results found for: PROLACTIN Lab Results  Component Value Date   CHOL 172 08/18/2017   TRIG 196 (H) 08/18/2017   HDL 44 (L) 08/18/2017   CHOLHDL 3.9 08/18/2017   VLDL 44 (H) 02/12/2017   LDLCALC 99 08/18/2017   LDLCALC 78 02/12/2017   Lab Results  Component Value Date   TSH 3.46 08/18/2017   TSH 2.21 02/12/2017    Therapeutic Level Labs: No results found for: LITHIUM No results found for: VALPROATE No components found for:  CBMZ  Current Medications: Current Outpatient Medications  Medication Sig Dispense Refill  . ALPRAZolam (XANAX XR) 0.5 MG 24 hr tablet Take 1 tablet (0.5 mg total) by mouth daily. 30 tablet 2  . aspirin 81 MG EC tablet Take 81 mg by mouth daily.      Marland Kitchen atorvastatin (LIPITOR) 40 MG tablet TAKE 1 TABLET BY MOUTH AT BEDTIME. GENERIC EQUIVALENT FOR LIPITOR 90 tablet 1  . Azelastine HCl 0.15 % SOLN Place 2 sprays into both  nostrils 2 (two) times daily.   3  . cloNIDine (CATAPRES) 0.1 MG tablet TAKE 1 TABLET(0.1MG  TOTAL) BY MOUTH 2 TIMES DAILY. START ATNIGHT FOR HOT FLASHES AND TRY TAKING TWICE DAILY IF TOLERATED 180 tablet 0  . conjugated estrogens (PREMARIN) vaginal cream Place 1 Applicatorful vaginally daily. Apply 0.5mg  (pea-sized amount)  just inside the vaginal introitus with a finger-tip every night for two weeks and then Monday, Wednesday and Friday nights. 30 g 12  .  cyclobenzaprine (FLEXERIL) 5 MG tablet Take 5 mg by mouth 3 (three) times daily as needed.   0  . diclofenac (CATAFLAM) 50 MG tablet Take 1 tablet (50 mg total) by mouth 3 (three) times daily as needed. 60 tablet 2  . diclofenac (VOLTAREN) 75 MG EC tablet TAKE 1 TABLET BY MOUTH EVERY 12 HOURS AS NEEDED 180 tablet 0  . DULoxetine (CYMBALTA) 30 MG capsule Take 1 capsule (30 mg total) by mouth 2 (two) times daily. 180 capsule 0  . EQ ALLERGY RELIEF 10 MG tablet TAKE ONE TABLET BY MOUTH ONCE DAILY 90 tablet 1  . fluticasone (FLONASE) 50 MCG/ACT nasal spray by Nasal route.    . gabapentin (NEURONTIN) 300 MG capsule One capsule twice a day for 2 weeks, then take one in the morning and 2 in the evening. 90 capsule 3  . hydrOXYzine (ATARAX/VISTARIL) 25 MG tablet Take 1 tablet (25 mg total) by mouth 3 (three) times daily as needed (for severe anxiety only). 270 tablet 0  . levothyroxine (SYNTHROID, LEVOTHROID) 88 MCG tablet TAKE 1 TABLET BY MOUTH DAILY BEFORE BREAKFAST. SANDOZ GENERIC ONLY.(STOP LEVOTHYROXINE 100MCG) 90 tablet 0  . linaclotide (LINZESS) 145 MCG CAPS capsule TAKE 1 CAPSULE BY MOUTH ONCE DAILY BEFORE BREAKFAST 90 capsule 1  . metoprolol succinate (TOPROL-XL) 50 MG 24 hr tablet TAKE 1 TABLET BY MOUTH DAILY 90 tablet 1  . mometasone (ASMANEX 120 METERED DOSES) 220 MCG/INH inhaler Inhale 2 puffs into the lungs daily. 1 Inhaler 12  . montelukast (SINGULAIR) 10 MG tablet Take 1 tablet (10 mg total) by mouth at bedtime. 90 tablet 1  . nitroGLYCERIN  (NITROSTAT) 0.4 MG SL tablet Place 1 tablet (0.4 mg total) under the tongue every 5 (five) minutes as needed. 25 tablet 0  . olmesartan-hydrochlorothiazide (BENICAR HCT) 40-25 MG tablet Take 1 tablet by mouth daily. 90 tablet 1  . omeprazole (PRILOSEC) 40 MG capsule Take 1 capsule (40 mg total) by mouth daily. 90 capsule 0  . QUEtiapine (SEROQUEL) 100 MG tablet Take 1 tablet (100 mg total) by mouth at bedtime. 90 tablet 0  . rizatriptan (MAXALT) 10 MG tablet Take 1 tablet (10 mg total) by mouth 3 (three) times daily as needed for migraine. 10 tablet 5   No current facility-administered medications for this visit.    Facility-Administered Medications Ordered in Other Visits  Medication Dose Route Frequency Provider Last Rate Last Dose  . gadopentetate dimeglumine (MAGNEVIST) injection 20 mL  20 mL Intravenous Once PRN Ward Givens, NP         Musculoskeletal: Strength & Muscle Tone: within normal limits Gait & Station: normal Patient leans: N/A  Psychiatric Specialty Exam: Review of Systems  Psychiatric/Behavioral: The patient is nervous/anxious and has insomnia.   All other systems reviewed and are negative.   Blood pressure 139/84, pulse 80, height 5\' 5"  (1.651 m), weight 262 lb (118.8 kg), SpO2 93 %.Body mass index is 43.6 kg/m.  General Appearance: Casual  Eye Contact:  Fair  Speech:  Clear and Coherent  Volume:  Normal  Mood:  Anxious IMPROVING  Affect:  Appropriate  Thought Process:  Goal Directed and Descriptions of Associations: Intact  Orientation:  Full (Time, Place, and Person)  Thought Content: Logical   Suicidal Thoughts:  No  Homicidal Thoughts:  No  Memory:  Immediate;   Fair Recent;   Fair Remote;   Fair  Judgement:  Fair  Insight:  Fair  Psychomotor Activity:  Normal  Concentration:  Concentration: Fair and  Attention Span: Fair  Recall:  [employer] of Knowledge: Fair  Language: Fair  Akathisia:  No  Handed:  Right  AIMS (if indicated): 0  Assets:   Communication Skills Desire for Improvement Social Support  ADL's:  Intact  Cognition: WNL  Sleep:  improving   Screenings: GAD-7     Office Visit from 03/04/2016 in [facility] Office Visit from 12/20/2015 in [facility]  Total GAD-7 Score  18  18    PHQ2-9     Office Visit from 12/17/2017 in [facility] Office Visit from 08/18/2017 in [facility] Office Visit from 02/12/2017 in [facility] Office Visit from 10/07/2016 in [facility] Office Visit from 07/09/2016 in [facility]  PHQ-2 Total Score  1  5  0  2  2  PHQ-9 Total Score  10  21  -  3  4       Assessment and Plan: Carime is a 57 year old African-American female who is married, unemployed, lives in [place], has a history of depression, anxiety, insomnia, OSA on CPAP, multiple medical problems like hypothyroidism, arthritis, presented to the clinic today for a follow-up visit.  Patient is currently back on her Cymbalta as well as continues to be compliant with Seroquel.  Patient will start psychotherapy soon.  Plan as noted below.  Plan MDD Continue Cymbalta 30 mg p.o. twice daily Continue Seroquel 100 mg p.o. nightly  For GAD Cymbalta 30 mg p.o. twice daily Continue to taper of Xanax.  She reports she uses it less frequently. Continue hydroxyzine 25 mg as needed for anxiety attacks  We will start CBT with Ms. Peacock.  Follow-up in clinic in 2 months.  More than 50 % of the time was spent for psychoeducation and supportive psychotherapy and care coordination.  This note was generated in part or whole with voice recognition software. Voice recognition is usually quite accurate but there are transcription errors that can and very often do occur. I apologize for any typographical errors that were not detected and corrected.          Ursula Alert, MD 03/04/2018, 2:23 PM

## 2018-03-14 ENCOUNTER — Other Ambulatory Visit: Payer: Self-pay | Admitting: Family Medicine

## 2018-03-14 DIAGNOSIS — K219 Gastro-esophageal reflux disease without esophagitis: Secondary | ICD-10-CM

## 2018-03-16 NOTE — Progress Notes (Signed)
03/17/2018 11:25 AM   Beverly Barrett 07-24-1961 [id number]  Referring provider: Steele Sizer, MD [address] [place], [place] [address]  Chief Complaint  Patient presents with  . Follow-up    HPI: 57 yo AAF with mixed urinary incontinence and vaginal atrophy who presents today for a one year follow up.   Mixed urinary incontinence The patient has been experiencing frequency x 5-6, nocturia x 1-4 and post void dribbling.  Patient denies any gross hematuria, dysuria or suprapubic/flank pain.  Patient denies any fevers, chills, nausea or vomiting.  Her PVR is 44 mL.    Vaginal atrophy She is applying the vaginal cream 3 nights weekly. She's not had vaginal irritation or rash.  PMH: Past Medical History:  Diagnosis Date  . Anginal pain ([place])    none in approx 2 yrs  . Anxiety   . Asthma   . Chronic lower back pain   . Common migraine with intractable migraine 09/20/2016  . Depression    suicidal ideation  . GERD (gastroesophageal reflux disease)   . Headache    migraines -3x/wk  . Hyperlipidemia   . Hypertension   . Hypothyroidism   . Motion sickness    cars  . Multilevel degenerative disc disease   . Shortness of breath dyspnea   . Sleep apnea    CPAP  . Vertigo   . Wears dentures    partial upper    Surgical History: Past Surgical History:  Procedure Laterality Date  . ABDOMINAL HYSTERECTOMY    . bladder tach  1995  . CESAREAN SECTION    . COLONOSCOPY WITH PROPOFOL N/A 05/29/2015   Procedure: COLONOSCOPY WITH PROPOFOL;  Surgeon: Lucilla Lame, MD;  Location: [facility];  Service: Endoscopy;  Laterality: N/A;  Latex allergy sleep apnea - no CPAP machine (yet)  . ESOPHAGOGASTRODUODENOSCOPY N/A 04/23/2017   Procedure: ESOPHAGOGASTRODUODENOSCOPY (EGD);  Surgeon: Lin Landsman, MD;  Location: [facility];  Service: Gastroenterology;  Laterality: N/A;  Latex allergy sleep apnea  . POLYPECTOMY  05/29/2015   Procedure:  POLYPECTOMY;  Surgeon: Lucilla Lame, MD;  Location: [facility];  Service: Endoscopy;;    Home Medications:  Allergies as of 03/17/2018      Reactions   Latex    Pt unsure of reaction pt states something happened while in the hospital after surgery.      Medication List        Accurate as of 03/17/18 11:25 AM. Always use your most recent med list.          ALPRAZolam 0.5 MG 24 hr tablet Commonly known as:  XANAX XR Take 1 tablet (0.5 mg total) by mouth daily.   aspirin 81 MG EC tablet Take 81 mg by mouth daily.   atorvastatin 40 MG tablet Commonly known as:  LIPITOR TAKE 1 TABLET BY MOUTH AT BEDTIME. GENERIC EQUIVALENT FOR LIPITOR   Azelastine HCl 0.15 % Soln Place 2 sprays into both nostrils 2 (two) times daily.   cloNIDine 0.1 MG tablet Commonly known as:  CATAPRES TAKE 1 TABLET(0.1MG  TOTAL) BY MOUTH 2 TIMES DAILY. START ATNIGHT FOR HOT FLASHES AND TRY TAKING TWICE DAILY IF TOLERATED   conjugated estrogens vaginal cream Commonly known as:  PREMARIN Place 1 Applicatorful vaginally daily. Apply 0.5mg  (pea-sized amount)  just inside the vaginal introitus with a finger-tip every night for two weeks and then Monday, Wednesday and Friday nights.   cyclobenzaprine 5 MG tablet Commonly known as:  FLEXERIL Take  5 mg by mouth 3 (three) times daily as needed.   diclofenac 50 MG tablet Commonly known as:  CATAFLAM Take 1 tablet (50 mg total) by mouth 3 (three) times daily as needed.   diclofenac 75 MG EC tablet Commonly known as:  VOLTAREN TAKE 1 TABLET BY MOUTH EVERY 12 HOURS AS NEEDED   DULoxetine 30 MG capsule Commonly known as:  CYMBALTA Take 1 capsule (30 mg total) by mouth 2 (two) times daily.   EQ ALLERGY RELIEF 10 MG tablet Generic drug:  loratadine TAKE ONE TABLET BY MOUTH ONCE DAILY   fluticasone 50 MCG/ACT nasal spray Commonly known as:  FLONASE by Nasal route.   gabapentin 300 MG capsule Commonly known as:  NEURONTIN One capsule twice a day for  2 weeks, then take one in the morning and 2 in the evening.   hydrocortisone 2.5 % rectal cream Commonly known as:  ANUSOL-HC Place rectally.   hydrOXYzine 25 MG tablet Commonly known as:  ATARAX/VISTARIL Take 1 tablet (25 mg total) by mouth 3 (three) times daily as needed (for severe anxiety only).   levothyroxine 88 MCG tablet Commonly known as:  SYNTHROID, LEVOTHROID TAKE 1 TABLET BY MOUTH DAILY BEFORE BREAKFAST. SANDOZ GENERIC ONLY.(STOP LEVOTHYROXINE 100MCG)   linaclotide 145 MCG Caps capsule Commonly known as:  LINZESS TAKE 1 CAPSULE BY MOUTH ONCE DAILY BEFORE BREAKFAST   metoprolol succinate 50 MG 24 hr tablet Commonly known as:  TOPROL-XL TAKE 1 TABLET BY MOUTH DAILY   mometasone 220 MCG/INH inhaler Commonly known as:  ASMANEX Inhale 2 puffs into the lungs daily.   montelukast 10 MG tablet Commonly known as:  SINGULAIR Take 1 tablet (10 mg total) by mouth at bedtime.   nitroGLYCERIN 0.4 MG SL tablet Commonly known as:  NITROSTAT Place 1 tablet (0.4 mg total) under the tongue every 5 (five) minutes as needed.   olmesartan-hydrochlorothiazide 40-25 MG tablet Commonly known as:  BENICAR HCT Take 1 tablet by mouth daily.   omeprazole 40 MG capsule Commonly known as:  PRILOSEC TAKE 1 CAPSULE(40MG  TOTAL) BY MOUTH DAILY   QUEtiapine 100 MG tablet Commonly known as:  SEROQUEL Take 1 tablet (100 mg total) by mouth at bedtime.   rizatriptan 10 MG tablet Commonly known as:  MAXALT Take 1 tablet (10 mg total) by mouth 3 (three) times daily as needed for migraine.   Trospium Chloride 60 MG Cp24 Take 1 capsule (60 mg total) by mouth daily.       Allergies:  Allergies  Allergen Reactions  . Latex     Pt unsure of reaction pt states something happened while in the hospital after surgery.    Family History: Family History  Problem Relation Age of Onset  . Diabetes Sister   . Anxiety disorder Sister   . Depression Sister   . Hypertension Sister   .  Cirrhosis Mother   . Cirrhosis Father   . Breast cancer Sister 31  . Heart attack Brother   . Alcohol abuse Brother   . Drug abuse Brother   . Prostate cancer Neg Hx   . Kidney cancer Neg Hx   . Bladder Cancer Neg Hx     Social History:  reports that she has never smoked. She has never used smokeless tobacco. She reports that she does not drink alcohol or use drugs.  ROS: UROLOGY Frequent Urination?: Yes Hard to postpone urination?: No Burning/pain with urination?: No Get up at night to urinate?: Yes Leakage of urine?: Yes Urine stream  starts and stops?: No Trouble starting stream?: No Do you have to strain to urinate?: No Blood in urine?: No Urinary tract infection?: No Sexually transmitted disease?: No Injury to kidneys or bladder?: No Painful intercourse?: Yes Weak stream?: No Currently pregnant?: No Vaginal bleeding?: No Last menstrual period?: Hydterectomy   Gastrointestinal Nausea?: Yes Vomiting?: No Indigestion/heartburn?: Yes Diarrhea?: No Constipation?: No  Constitutional Fever: No Night sweats?: No Weight loss?: No Fatigue?: No  Skin Skin rash/lesions?: No Itching?: No  Eyes Blurred vision?: No Double vision?: No  Ears/Nose/Throat Sore throat?: No Sinus problems?: Yes  Hematologic/Lymphatic Swollen glands?: No Easy bruising?: No  Cardiovascular Leg swelling?: Yes Chest pain?: Yes  Respiratory Cough?: No Shortness of breath?: Yes  Endocrine Excessive thirst?: Yes  Musculoskeletal Back pain?: Yes Joint pain?: Yes  Neurological Headaches?: Yes Dizziness?: Yes  Psychologic Depression?: Yes Anxiety?: Yes  Physical Exam: BP 135/81 (BP Location: Left Arm, Patient Position: Sitting, Cuff Size: Large)   Pulse 63   Ht 5\' 5"  (1.651 m)   Wt 268 lb 3.2 oz (121.7 kg)   BMI 44.63 kg/m   Constitutional: Well nourished. Alert and oriented, No acute distress. HEENT: [place] AT, moist mucus membranes. Trachea midline, no  masses. Cardiovascular: No clubbing, cyanosis, or edema. Respiratory: Normal respiratory effort, no increased work of breathing. GI: Abdomen is soft, non tender, non distended, no abdominal masses. Liver and spleen not palpable.  No hernias appreciated.  Stool sample for occult testing is not indicated.   GU: No CVA tenderness.  No bladder fullness or masses.  Normal external genitalia, normal pubic hair distribution, no lesions.  Normal urethral meatus, no lesions, no prolapse, no discharge.   No urethral masses, tenderness and/or tenderness. No bladder fullness, tenderness or masses. Normal vagina mucosa, good estrogen effect, no discharge, no lesions, poor pelvic support, grade I cystocele and no rectocele noted.  Cervix, uterus and adnexa are surgically absent.   Anus and perineum are without rashes or lesions.   Skin: No rashes, bruises or suspicious lesions. Lymph: No cervical or inguinal adenopathy. Neurologic: Grossly intact, no focal deficits, moving all 4 extremities. Psychiatric: Normal mood and affect.  Laboratory Data: Lab Results  Component Value Date   WBC 4.1 08/18/2017   HGB 11.3 (L) 08/18/2017   HCT 35.1 08/18/2017   MCV 91.9 08/18/2017   PLT 194 08/18/2017    Lab Results  Component Value Date   CREATININE 0.71 08/18/2017     Lab Results  Component Value Date   HGBA1C 6.6 (H) 08/18/2017    Lab Results  Component Value Date   TSH 3.46 08/18/2017       Component Value Date/Time   CHOL 172 08/18/2017 1232   CHOL 192 12/20/2015 1012   HDL 44 (L) 08/18/2017 1232   HDL 46 12/20/2015 1012   CHOLHDL 3.9 08/18/2017 1232   VLDL 44 (H) 02/12/2017 1059   LDLCALC 99 08/18/2017 1232    Lab Results  Component Value Date   AST 17 08/18/2017   Lab Results  Component Value Date   ALT 14 08/18/2017   I have reviewed the labs  Pertinent Imaging: Results for KIMIYAH, BLICK (MRN [id number]) as of 03/17/2018 11:28  Ref. Range 03/17/2018 10:47  Scan Result  Unknown 58mL    Assessment & Plan:    1. Mixed urinary Incontinence Patient found the Myrbetriq cost prohibitive and had to discontinue the medication We will have her try [place] XL 60 mg daily and she will return in 6 weeks for  OAB questionnaire and PVR                              2. Vaginal atrophy  - Continue Premarin cream 3 nights weekly  - She'll return to clinic in 12 months for exam  Return in about 6 weeks (around 04/28/2018) for PVR and OAB questionnaire.  These notes generated with voice recognition software. I apologize for typographical errors.  Zara Council, PA-C  [facility] [address] [place], [place] [address] [phone]

## 2018-03-17 ENCOUNTER — Ambulatory Visit: Payer: BLUE CROSS/BLUE SHIELD | Admitting: Urology

## 2018-03-17 ENCOUNTER — Encounter: Payer: Self-pay | Admitting: Urology

## 2018-03-17 VITALS — BP 135/81 | HR 63 | Ht 65.0 in | Wt 268.2 lb

## 2018-03-17 DIAGNOSIS — N952 Postmenopausal atrophic vaginitis: Secondary | ICD-10-CM | POA: Diagnosis not present

## 2018-03-17 DIAGNOSIS — N3946 Mixed incontinence: Secondary | ICD-10-CM | POA: Diagnosis not present

## 2018-03-17 LAB — BLADDER SCAN AMB NON-IMAGING

## 2018-03-17 MED ORDER — TROSPIUM CHLORIDE ER 60 MG PO CP24: 60.0000 mg | ORAL_CAPSULE | Freq: Every day | ORAL | 3 refills | Status: DC

## 2018-03-19 ENCOUNTER — Encounter: Payer: Self-pay | Admitting: Family Medicine

## 2018-03-19 ENCOUNTER — Ambulatory Visit: Payer: BLUE CROSS/BLUE SHIELD | Admitting: Family Medicine

## 2018-03-19 VITALS — BP 136/74 | HR 74 | Temp 98.5°F | Resp 18 | Ht 65.0 in | Wt 259.7 lb

## 2018-03-19 DIAGNOSIS — E1169 Type 2 diabetes mellitus with other specified complication: Secondary | ICD-10-CM | POA: Diagnosis not present

## 2018-03-19 DIAGNOSIS — G4733 Obstructive sleep apnea (adult) (pediatric): Secondary | ICD-10-CM

## 2018-03-19 DIAGNOSIS — F411 Generalized anxiety disorder: Secondary | ICD-10-CM

## 2018-03-19 DIAGNOSIS — D508 Other iron deficiency anemias: Secondary | ICD-10-CM

## 2018-03-19 DIAGNOSIS — Z9989 Dependence on other enabling machines and devices: Secondary | ICD-10-CM

## 2018-03-19 DIAGNOSIS — E039 Hypothyroidism, unspecified: Secondary | ICD-10-CM

## 2018-03-19 DIAGNOSIS — F331 Major depressive disorder, recurrent, moderate: Secondary | ICD-10-CM

## 2018-03-19 DIAGNOSIS — J4520 Mild intermittent asthma, uncomplicated: Secondary | ICD-10-CM

## 2018-03-19 DIAGNOSIS — Z23 Encounter for immunization: Secondary | ICD-10-CM

## 2018-03-19 DIAGNOSIS — I10 Essential (primary) hypertension: Secondary | ICD-10-CM

## 2018-03-19 DIAGNOSIS — E785 Hyperlipidemia, unspecified: Secondary | ICD-10-CM | POA: Diagnosis not present

## 2018-03-19 DIAGNOSIS — K641 Second degree hemorrhoids: Secondary | ICD-10-CM

## 2018-03-19 DIAGNOSIS — I209 Angina pectoris, unspecified: Secondary | ICD-10-CM

## 2018-03-19 DIAGNOSIS — Z6841 Body Mass Index (BMI) 40.0 and over, adult: Secondary | ICD-10-CM

## 2018-03-19 LAB — POCT GLYCOSYLATED HEMOGLOBIN (HGB A1C): Hemoglobin A1C: 6.6 % — AB (ref 4.0–5.6)

## 2018-03-19 LAB — POCT UA - MICROALBUMIN: MICROALBUMIN (UR) POC: 20 mg/L

## 2018-03-19 MED ORDER — HYDROCORTISONE 2.5 % RE CREA: TOPICAL_CREAM | Freq: Two times a day (BID) | RECTAL | 0 refills | Status: DC

## 2018-03-19 NOTE — Patient Instructions (Signed)

## 2018-03-19 NOTE — Progress Notes (Signed)
Name: Beverly Barrett   MRN: [id number]    DOB: [birth year]   Date:03/23/2018       Progress Note  Subjective  Chief Complaint  Chief Complaint  Patient presents with  . Medication Refill    3 month F/U  . Depression    Seeing Dr. Shea Evans  . Hyperlipidemia  . Hypertension    Headaches, Dizziness, Edema in bilateral ankles and feet  . Gastroesophageal Reflux    At night time her acid reflux is flairing up- states the one pill in the morning is not controlling her symtpoms all day  . Asthma    SOB with exertion  . Migraine  . Hypothyroidism    HPI  Diabetes type 2 with dyslipidemia in obese: she has a long history of metabolic syndrome. However hgbA1C gradually went up and back in January 2019 her A1C went to 6.6%. Discussed new onset, she denies polyphagia or polyuria but has polydipsia but she states always felt thirsty. She never took medications for DM, she is on Atorvastatin. Discussed life style modification, discussed referral to diabetic teaching class. Yearly eye exam, urine micro, today we did a foot exam that was normal.    Major Depression/GAD:She is now under the care of Dr. Shea Evans, weaning self off Alprazolam, taking hdyroxizine prn anxiety and seems to be doing better, even though Phq 9 is higher. She is sleeping better, feeling less tired than in the past and is happy with medications.   HTN: she taking her medication BenicarHCTZ and started on Toprol XL , July 2017. She states dizziness is very seldom now and resolves with dramamin   Angina: had evaluation by cardiologist and Echo myoview in July normal result. She states chest pain still happens and at times during rest. She is on statin, aspirin, beta-blocker , ARB and has been taking NTG intermittently, but did not have to take any NTG since last visit. Pain is described as throbbing sensation on left side of chest and sometimes radiates to her left shoulder and back. Doing well at this time, no recent episodes  of chest pain   Constipation: she was started on Linzess July 2017 and states has bowel movements daily , no straining or blood in stools, hemoccult negative April 2018. No side effects of medications Unchanged . Rectal bleeding from hemorrhoids has been better  OSA: using CPAP every night, all night, and we started her on Seroquel She is sleeping 6-7 hours, feeling rested most mornings.   Morbid obesity:she lost weight since last visit, going for walks with her daughter 2-3 times a week. Feeling better    GERD:Seen by Dr. Marius Ditch and has gastritis, advised to stop goody powders and she has been following the recommendation. Waking up with nausea, but improves once she gest up and takes Omeprazole and Dramamine. Stable   Migraine: seen at the headache center by Dr. Jannifer Franklin, started on Topamax and Maxalt recently, still having headaches at this time, she is off Topamax, and on gabapentin, but still having symptoms. She states less frequent now at most twice per week now.   Urinary urgency and incontinence: symptoms of low back and supra pubic pain again, urgency, nocturia, and sometimes she cannot make it to the bathroom. Still seeing Urologist trying a new medication now  Hypothyroidism she is currently taking medication daily, she has chronic dry skin and constipation ( resolved with Linzes), lastTSHwasback to normal,on Synthroid 88 mcg. Unchanged   DDD lumbar spine: had MRI done, ordered by Ortho and  was found to have DDD lumbar spine, seeing Dr. Sharlet Salina. Stable   Asthma mild: long history, she has intermittent symptoms, occasionally has a dry cough, no wheezing at this time  Angina: sees cardiologist once a year, she denies chest pain or palpitation at this time  Hemorrhoids: she would like a refill of topical medication   Patient Active Problem List   Diagnosis Date Noted  . Grade II internal hemorrhoids 05/16/2017  . Mixed stress and urge urinary incontinence 11/12/2016   . Degenerative arthritis of lumbar spine 11/12/2016  . Common migraine with intractable migraine 09/20/2016  . Iron deficiency anemia 09/12/2016  . GAD (generalized anxiety disorder) 12/20/2015  . OSA on CPAP 12/20/2015  . Angina effort 12/20/2015  . Hyperglycemia 09/22/2015  . History of colonic polyps   . Benign neoplasm of sigmoid colon   . Frequency 04/14/2015  . Backache 04/14/2015  . Chronic pain of multiple joints 06/14/2014  . Dysmetabolic syndrome 42/35/3614  . Acid reflux 06/14/2014  . Extreme obesity 06/14/2014  . Arthritis, degenerative 06/14/2014  . Morbid obesity with BMI of 40.0-44.9, adult ([place]) 06/14/2014  . Hypothyroidism due to acquired atrophy of thyroid 08/18/2013  . Hypothyroidism, unspecified 08/18/2013  . Hypertension goal BP (blood pressure) < 140/90 12/07/2010  . Familial multiple lipoprotein-type hyperlipidemia 12/07/2010  . CN (constipation) 11/21/2009  . Allergic rhinitis 10/13/2008  . Asthma, mild intermittent, well-controlled 09/01/2008  . Major depressive disorder, recurrent episode, in partial remission with anxious distress ([place]) 08/13/2007    Past Surgical History:  Procedure Laterality Date  . ABDOMINAL HYSTERECTOMY    . bladder tach  1995  . CESAREAN SECTION    . COLONOSCOPY WITH PROPOFOL N/A 05/29/2015   Procedure: COLONOSCOPY WITH PROPOFOL;  Surgeon: Lucilla Lame, MD;  Location: [place];  Service: Endoscopy;  Laterality: N/A;  Latex allergy sleep apnea - no CPAP machine (yet)  . ESOPHAGOGASTRODUODENOSCOPY N/A 04/23/2017   Procedure: ESOPHAGOGASTRODUODENOSCOPY (EGD);  Surgeon: Lin Landsman, MD;  Location: [place];  Service: Gastroenterology;  Laterality: N/A;  Latex allergy sleep apnea  . POLYPECTOMY  05/29/2015   Procedure: POLYPECTOMY;  Surgeon: Lucilla Lame, MD;  Location: [place];  Service: Endoscopy;;    Family History  Problem Relation Age of Onset  . Diabetes Sister   . Anxiety disorder  Sister   . Depression Sister   . Hypertension Sister   . Cirrhosis Mother   . Cirrhosis Father   . Breast cancer Sister 24  . Heart attack Brother   . Alcohol abuse Brother   . Drug abuse Brother   . Prostate cancer Neg Hx   . Kidney cancer Neg Hx   . Bladder Cancer Neg Hx     Social History   Socioeconomic History  . Marital status: Married    Spouse name: clayton sr  . Number of children: 3  . Years of education: Not on file  . Highest education level: GED or equivalent  Occupational History  . Not on file  Social Needs  . Financial resource strain: Very hard  . Food insecurity:    Worry: Often true    Inability: Often true  . Transportation needs:    Medical: No    Non-medical: No  Tobacco Use  . Smoking status: Never Smoker  . Smokeless tobacco: Never Used  Substance and Sexual Activity  . Alcohol use: No    Alcohol/week: 0.0 standard drinks  . Drug use: No  . Sexual activity: Yes  Partners: Male  Lifestyle  . Physical activity:    Days per week: 2 days    Minutes per session: 30 min  . Stress: Very much  Relationships  . Social connections:    Talks on phone: Three times a week    Gets together: Once a week    Attends religious service: More than 4 times per year    Active member of club or organization: No    Attends meetings of clubs or organizations: Never    Relationship status: Married  . Intimate partner violence:    Fear of current or ex partner: No    Emotionally abused: No    Physically abused: No    Forced sexual activity: No  Other Topics Concern  . Not on file  Social History Narrative   Lives with husband and one daughter other kids are out of the house   She does not work, husband on disability      Current Outpatient Medications:  .  ALPRAZolam (XANAX XR) 0.5 MG 24 hr tablet, Take 1 tablet (0.5 mg total) by mouth daily., Disp: 30 tablet, Rfl: 2 .  aspirin 81 MG EC tablet, Take 81 mg by mouth daily.  , Disp: , Rfl:  .   atorvastatin (LIPITOR) 40 MG tablet, TAKE 1 TABLET BY MOUTH AT BEDTIME. GENERIC EQUIVALENT FOR LIPITOR, Disp: 90 tablet, Rfl: 1 .  Azelastine HCl 0.15 % SOLN, Place 2 sprays into both nostrils 2 (two) times daily. , Disp: , Rfl: 3 .  cloNIDine (CATAPRES) 0.1 MG tablet, TAKE 1 TABLET(0.1MG  TOTAL) BY MOUTH 2 TIMES DAILY. START ATNIGHT FOR HOT FLASHES AND TRY TAKING TWICE DAILY IF TOLERATED, Disp: 180 tablet, Rfl: 0 .  conjugated estrogens (PREMARIN) vaginal cream, Place 1 Applicatorful vaginally daily. Apply 0.5mg  (pea-sized amount)  just inside the vaginal introitus with a finger-tip every night for two weeks and then Monday, Wednesday and Friday nights., Disp: 30 g, Rfl: 12 .  cyclobenzaprine (FLEXERIL) 5 MG tablet, Take 5 mg by mouth 3 (three) times daily as needed. , Disp: , Rfl: 0 .  diclofenac (CATAFLAM) 50 MG tablet, Take 1 tablet (50 mg total) by mouth 3 (three) times daily as needed., Disp: 60 tablet, Rfl: 2 .  EQ ALLERGY RELIEF 10 MG tablet, TAKE ONE TABLET BY MOUTH ONCE DAILY, Disp: 90 tablet, Rfl: 1 .  fluticasone (FLONASE) 50 MCG/ACT nasal spray, by Nasal route., Disp: , Rfl:  .  gabapentin (NEURONTIN) 300 MG capsule, One capsule twice a day for 2 weeks, then take one in the morning and 2 in the evening., Disp: 90 capsule, Rfl: 3 .  hydrocortisone (ANUSOL-HC) 2.5 % rectal cream, Place rectally 2 (two) times daily., Disp: 30 g, Rfl: 0 .  hydrOXYzine (ATARAX/VISTARIL) 25 MG tablet, Take 1 tablet (25 mg total) by mouth 3 (three) times daily as needed (for severe anxiety only)., Disp: 270 tablet, Rfl: 0 .  levothyroxine (SYNTHROID, LEVOTHROID) 88 MCG tablet, TAKE 1 TABLET BY MOUTH DAILY BEFORE BREAKFAST. SANDOZ GENERIC ONLY.(STOP LEVOTHYROXINE 100MCG), Disp: 90 tablet, Rfl: 0 .  linaclotide (LINZESS) 145 MCG CAPS capsule, TAKE 1 CAPSULE BY MOUTH ONCE DAILY BEFORE BREAKFAST, Disp: 90 capsule, Rfl: 1 .  metoprolol succinate (TOPROL-XL) 50 MG 24 hr tablet, TAKE 1 TABLET BY MOUTH DAILY, Disp: 90  tablet, Rfl: 1 .  mometasone (ASMANEX 120 METERED DOSES) 220 MCG/INH inhaler, Inhale 2 puffs into the lungs daily., Disp: 1 Inhaler, Rfl: 12 .  montelukast (SINGULAIR) 10 MG tablet, Take 1  tablet (10 mg total) by mouth at bedtime., Disp: 90 tablet, Rfl: 1 .  nitroGLYCERIN (NITROSTAT) 0.4 MG SL tablet, Place 1 tablet (0.4 mg total) under the tongue every 5 (five) minutes as needed., Disp: 25 tablet, Rfl: 0 .  olmesartan-hydrochlorothiazide (BENICAR HCT) 40-25 MG tablet, Take 1 tablet by mouth daily., Disp: 90 tablet, Rfl: 1 .  omeprazole (PRILOSEC) 40 MG capsule, TAKE 1 CAPSULE(40MG  TOTAL) BY MOUTH DAILY, Disp: 90 capsule, Rfl: 0 .  QUEtiapine (SEROQUEL) 100 MG tablet, Take 1 tablet (100 mg total) by mouth at bedtime., Disp: 90 tablet, Rfl: 0 .  rizatriptan (MAXALT) 10 MG tablet, Take 1 tablet (10 mg total) by mouth 3 (three) times daily as needed for migraine., Disp: 10 tablet, Rfl: 5 .  Trospium Chloride 60 MG CP24, Take 1 capsule (60 mg total) by mouth daily., Disp: 90 each, Rfl: 3 .  DULoxetine (CYMBALTA) 30 MG capsule, Take 1 capsule (30 mg total) by mouth 2 (two) times daily. (Patient not taking: Reported on 03/19/2018), Disp: 180 capsule, Rfl: 0 No current facility-administered medications for this visit.   Facility-Administered Medications Ordered in Other Visits:  .  gadopentetate dimeglumine (MAGNEVIST) injection 20 mL, 20 mL, Intravenous, Once PRN, Ward Givens, NP  Allergies  Allergen Reactions  . Latex     Pt unsure of reaction pt states something happened while in the hospital after surgery.     ROS  Constitutional: Negative for fever or weight change.  Respiratory: Negative for cough and shortness of breath.   Cardiovascular: Negative for chest pain or palpitations.  Gastrointestinal: Negative for abdominal pain, no bowel changes.  Musculoskeletal: Negative for gait problem or joint swelling.  Skin: Negative for rash.  Neurological: Positive for occasional dizziness but  no  headache.  No other specific complaints in a complete review of systems (except as listed in HPI above).  Objective  Vitals:   03/19/18 0902  BP: 136/74  Pulse: 74  Resp: 18  Temp: 98.5 F (36.9 C)  TempSrc: Oral  SpO2: 99%  Weight: 259 lb 11.2 oz (117.8 kg)  Height: 5\' 5"  (1.651 m)     Body mass index is 43.22 kg/m.  Physical Exam  Constitutional: Patient appears well-developed and well-nourished. Obese  No distress.  HEENT: head atraumatic, normocephalic, pupils equal and reactive to light,  neck supple, throat within normal limits Cardiovascular: Normal rate, regular rhythm and normal heart sounds.  No murmur heard. No BLE edema. Pulmonary/Chest: Effort normal and breath sounds normal. No respiratory distress. Abdominal: Soft.  There is no tenderness. Psychiatric: Patient has a normal mood and affect. behavior is normal. Judgment and thought content normal.  Recent Results (from the past 2160 hour(s))  Bladder Scan (Post Void Residual) in office     Status: None   Collection Time: 03/17/18 10:47 AM  Result Value Ref Range   Scan Result 69mL   POCT HgB A1C     Status: Abnormal   Collection Time: 03/19/18  9:59 AM  Result Value Ref Range   Hemoglobin A1C 6.6 (A) 4.0 - 5.6 %   HbA1c POC (<> result, manual entry)     HbA1c, POC (prediabetic range)     HbA1c, POC (controlled diabetic range)    POCT UA - Microalbumin     Status: Normal   Collection Time: 03/19/18 10:08 AM  Result Value Ref Range   Microalbumin Ur, POC 20 mg/L   Creatinine, POC     Albumin/Creatinine Ratio, Urine, POC  Diabetic Foot Exam: Diabetic Foot Exam - Simple   Simple Foot Form Diabetic Foot exam was performed with the following findings:  Yes 03/19/2018  9:53 AM  Visual Inspection No deformities, no ulcerations, no other skin breakdown bilaterally:  Yes Sensation Testing Intact to touch and monofilament testing bilaterally:  Yes Pulse Check Posterior Tibialis and Dorsalis pulse  intact bilaterally:  Yes Comments      PHQ2/9: Depression screen [facility] 2/9 03/19/2018 12/17/2017 08/18/2017 02/12/2017 10/07/2016  Decreased Interest 2 0 2 0 1  Down, Depressed, Hopeless 2 1 3  0 1  PHQ - 2 Score 4 1 5  0 2  Altered sleeping 1 1 3  - 1  Tired, decreased energy 2 0 3 - 0  Change in appetite 2 1 2  - 0  Feeling bad or failure about yourself  2 1 2  - 0  Trouble concentrating 2 2 2  - 0  Moving slowly or fidgety/restless 1 2 2  - 0  Suicidal thoughts 0 2 2 - 0  PHQ-9 Score 14 10 21  - 3  Difficult doing work/chores Somewhat difficult Very difficult Very difficult - Somewhat difficult  Some recent data might be hidden    Fall Risk: Fall Risk  03/19/2018 12/17/2017 08/18/2017 05/16/2017 03/27/2017  Falls in the past year? No Yes Yes Yes No  Number falls in past yr: - 2 or more 2 or more 1 -  Injury with Fall? - No Yes No -  Comment - - Contusion on her legs and arms - -  Follow up - - - Education provided -     Functional Status Survey: Is the patient deaf or have difficulty hearing?: No Does the patient have difficulty seeing, even when wearing glasses/contacts?: Yes Does the patient have difficulty concentrating, remembering, or making decisions?: No Does the patient have difficulty walking or climbing stairs?: Yes(Asthma) Does the patient have difficulty dressing or bathing?: No Does the patient have difficulty doing errands alone such as visiting a doctor's office or shopping?: No   Assessment & Plan  1. Dyslipidemia associated with type 2 diabetes mellitus (HCC)  - POCT HgB A1C  2. Depression, major, recurrent, moderate ([place])  Doing much better, keep follow up with Dr. Shea Evans   3. Hyperlipidemia with target LDL less than 100  - COMPLETE METABOLIC PANEL WITH GFR - Lipid panel  4. Morbid obesity with BMI of 40.0-44.9, adult [facility])  Discussed with the patient the risk posed by an increased BMI. Discussed importance of portion control, calorie counting and at least  150 minutes of physical activity weekly. Avoid sweet beverages and drink more water. Eat at least 6 servings of fruit and vegetables daily   5. Asthma, mild intermittent, well-controlled  Stable   6. Other iron deficiency anemia  - CBC with Differential/Platelet  7. Hypothyroidism, adult  - TSH  8. OSA on CPAP   9. GAD (generalized anxiety disorder)  Doing better, weaning self off alprazolam and taking prn hydroxizine  10. Hypertension goal BP (blood pressure) < 140/90  At goal   11. Grade II hemorrhoids  - hydrocortisone (ANUSOL-HC) 2.5 % rectal cream; Place rectally 2 (two) times daily.  Dispense: 30 g; Refill: 0  12. Angina pectoris [facility])  Keep cardiologist follow up yearly   13. Needs flu shot  - Flu Vaccine QUAD 36+ mos IM  14. Need for vaccination for pneumococcus  - Pneumococcal polysaccharide vaccine 23-valent greater than or equal to 2yo subcutaneous/IM

## 2018-03-20 ENCOUNTER — Encounter: Payer: Self-pay | Admitting: Family Medicine

## 2018-03-23 ENCOUNTER — Ambulatory Visit (INDEPENDENT_AMBULATORY_CARE_PROVIDER_SITE_OTHER): Payer: BLUE CROSS/BLUE SHIELD | Admitting: Licensed Clinical Social Worker

## 2018-03-23 ENCOUNTER — Other Ambulatory Visit: Payer: Self-pay | Admitting: Adult Health

## 2018-03-23 DIAGNOSIS — F411 Generalized anxiety disorder: Secondary | ICD-10-CM

## 2018-03-23 DIAGNOSIS — F331 Major depressive disorder, recurrent, moderate: Secondary | ICD-10-CM | POA: Diagnosis not present
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
## 2018-03-24 ENCOUNTER — Other Ambulatory Visit: Payer: Self-pay | Admitting: Family Medicine

## 2018-03-24 DIAGNOSIS — E034 Atrophy of thyroid (acquired): Secondary | ICD-10-CM

## 2018-03-24 LAB — LIPID PANEL
Cholesterol: 159 mg/dL (ref <200)
HDL: 43 mg/dL — ABNORMAL LOW (ref >50)
LDL Cholesterol (Calc): 85 mg/dL (calc)
Non-HDL Cholesterol (Calc): 116 mg/dL (calc) (ref <130)
Total CHOL/HDL Ratio: 3.7 (calc) (ref <5.0)
Triglycerides: 211 mg/dL — ABNORMAL HIGH (ref <150)

## 2018-03-24 LAB — CBC WITH DIFFERENTIAL/PLATELET
Basophils Absolute: 70 cells/uL (ref 0–200)
Basophils Relative: 0.7 %
EOS PCT: 2.1 %
Eosinophils Absolute: 210 cells/uL (ref 15–500)
HCT: 32.7 % — ABNORMAL LOW (ref 35.0–45.0)
HEMOGLOBIN: 10.9 g/dL — AB (ref 11.7–15.5)
LYMPHS ABS: 2490 {cells}/uL (ref 850–3900)
MCH: 27.7 pg (ref 27.0–33.0)
MCHC: 33.3 g/dL (ref 32.0–36.0)
MCV: 83.2 fL (ref 80.0–100.0)
MPV: 10.5 fL (ref 7.5–12.5)
Monocytes Relative: 9.5 %
NEUTROS ABS: 6280 {cells}/uL (ref 1500–7800)
Neutrophils Relative %: 62.8 %
PLATELETS: 308 10*3/uL (ref 140–400)
RBC: 3.93 10*6/uL (ref 3.80–5.10)
RDW: 14.3 % (ref 11.0–15.0)
Total Lymphocyte: 24.9 %
WBC mixed population: 950 cells/uL (ref 200–950)
WBC: 10 10*3/uL (ref 3.8–10.8)

## 2018-03-24 LAB — COMPLETE METABOLIC PANEL WITH GFR
AG RATIO: 1.5 (calc) (ref 1.0–2.5)
ALT: 11 U/L (ref 6–29)
AST: 16 U/L (ref 10–35)
Albumin: 4 g/dL (ref 3.6–5.1)
Alkaline phosphatase (APISO): 56 U/L (ref 33–130)
BILIRUBIN TOTAL: 0.3 mg/dL (ref 0.2–1.2)
BUN: 18 mg/dL (ref 7–25)
CHLORIDE: 103 mmol/L (ref 98–110)
CO2: 30 mmol/L (ref 20–32)
Calcium: 9.1 mg/dL (ref 8.6–10.4)
Creat: 0.84 mg/dL (ref 0.50–1.05)
GFR, Est African American: 89 mL/min/{1.73_m2} (ref >=60)
GFR, Est Non African American: 77 mL/min/{1.73_m2} (ref >=60)
Globulin: 2.7 g/dL (calc) (ref 1.9–3.7)
Glucose, Bld: 126 mg/dL (ref 65–139)
POTASSIUM: 3.7 mmol/L (ref 3.5–5.3)
Sodium: 142 mmol/L (ref 135–146)
Total Protein: 6.7 g/dL (ref 6.1–8.1)

## 2018-03-24 LAB — TSH: TSH: 4.53 m[IU]/L — AB (ref 0.40–4.50)

## 2018-03-24 MED ORDER — LEVOTHYROXINE SODIUM 88 MCG PO TABS: 88.0000 ug | ORAL_TABLET | Freq: Every day | ORAL | 0 refills | Status: DC

## 2018-04-14 ENCOUNTER — Encounter: Payer: BLUE CROSS/BLUE SHIELD | Attending: Family Medicine | Admitting: *Deleted

## 2018-04-14 ENCOUNTER — Encounter: Payer: Self-pay | Admitting: *Deleted

## 2018-04-14 ENCOUNTER — Telehealth: Payer: Self-pay | Admitting: *Deleted

## 2018-04-14 VITALS — BP 130/88 | Ht 65.0 in | Wt 265.1 lb

## 2018-04-14 DIAGNOSIS — E785 Hyperlipidemia, unspecified: Secondary | ICD-10-CM | POA: Insufficient documentation

## 2018-04-14 DIAGNOSIS — E1169 Type 2 diabetes mellitus with other specified complication: Secondary | ICD-10-CM | POA: Diagnosis not present

## 2018-04-14 DIAGNOSIS — Z713 Dietary counseling and surveillance: Secondary | ICD-10-CM | POA: Insufficient documentation

## 2018-04-14 DIAGNOSIS — E119 Type 2 diabetes mellitus without complications: Secondary | ICD-10-CM

## 2018-04-14 NOTE — Progress Notes (Signed)
Diabetes Self-Management Education  Visit Type: First/Initial  Appt. Start Time: 1100 Appt. End Time: 1210  04/14/2018  [name], identified by name and date of birth, is a 57 y.o. female with a diagnosis of Diabetes: Type 2.   ASSESSMENT  Blood pressure 130/88, height 5\' 5"  (1.651 m), weight 265 lb 1.6 oz (120.2 kg). Body mass index is 44.11 kg/m.  Diabetes Self-Management Education - 04/14/18 1639      Visit Information   Visit Type  First/Initial      Initial Visit   Diabetes Type  Type 2    Are you currently following a meal plan?  No    Are you taking your medications as prescribed?  Yes    Date Diagnosed  Jan 2019      Health Coping   How would you rate your overall health?  Fair      Psychosocial Assessment   Patient Belief/Attitude about Diabetes  Other (comment)   "I don't know"   Self-care barriers  None    Self-management support  Doctor's office;Family    Other persons present  Family Member   Daughter   Patient Concerns  Nutrition/Meal planning;Medication;Monitoring;Weight Control;Healthy Lifestyle    Special Needs  None    Preferred Learning Style  Auditory    Learning Readiness  Ready    How often do you need to have someone help you when you read instructions, pamphlets, or other written materials from your doctor or pharmacy?  3 - Sometimes    What is the last grade level you completed in school?  high school      Pre-Education Assessment   Patient understands the diabetes disease and treatment process.  Needs Instruction    Patient understands incorporating nutritional management into lifestyle.  Needs Instruction    Patient undertands incorporating physical activity into lifestyle.  Needs Instruction    Patient understands using medications safely.  Needs Instruction    Patient understands monitoring blood glucose, interpreting and using results  Needs Instruction    Patient understands prevention, detection, and treatment of acute  complications.  Needs Instruction    Patient understands prevention, detection, and treatment of chronic complications.  Needs Instruction    Patient understands how to develop strategies to address psychosocial issues.  Needs Instruction    Patient understands how to develop strategies to promote health/change behavior.  Needs Instruction      Complications   Last HgB A1C per patient/outside source  6.6 %   03/19/18   How often do you check your blood sugar?  Patient declines    Have you had a dilated eye exam in the past 12 months?  Yes    Have you had a dental exam in the past 12 months?  No    Are you checking your feet?  Yes    How many days per week are you checking your feet?  7      Dietary Intake   Breakfast  eggs, cheese, waffle, hot oatmeal, grits    Lunch  skips or has a sandwich, sub, chicken pie    Dinner  chicken, fish with baked potatoes, corn, beans, green beans, pasta, carrots, lettuce, tomatoes, cuccumber, greens, cabbage    Beverage(s)  water, fruit juice, regular soda, sweet tea, coffee      Exercise   Exercise Type  Light (walking / raking leaves)    How many days per week to you exercise?  3    How many minutes  per day do you exercise?  30    Total minutes per week of exercise  90      Patient Education   Previous Diabetes Education  No    Disease state   Definition of diabetes, type 1 and 2, and the diagnosis of diabetes;Factors that contribute to the development of diabetes    Nutrition management   Role of diet in the treatment of diabetes and the relationship between the three main macronutrients and blood glucose level;Food label reading, portion sizes and measuring food.    Physical activity and exercise   Role of exercise on diabetes management, blood pressure control and cardiac health.    Monitoring  Identified appropriate SMBG and/or A1C goals.    Chronic complications  Relationship between chronic complications and blood glucose control     Psychosocial adjustment  Identified and addressed patients feelings and concerns about diabetes      Individualized Goals (developed by patient)   Reducing Risk Decrease medications Prevent diabetes complications Lose weight Lead a healthier lifestyle Become more fit     Outcomes   Expected Outcomes  Demonstrated interest in learning. Expect positive outcomes    Future DMSE  4-6 wks       Individualized Plan for Diabetes Self-Management Training:   Learning Objective:  Patient will have a greater understanding of diabetes self-management. Patient education plan is to attend individual and/or group sessions per assessed needs and concerns.   Plan:   Patient Instructions  Exercise: Continue walking for 30-45  minutes  2-3 days a week and gradually increase to 150 minutes/week Eat 3 meals day,  1  snack a day Space meals 4-6 hours apart Don't skip meals Avoid sugar sweetened drinks (soda, tea, juices)  Expected Outcomes:  Demonstrated interest in learning. Expect positive outcomes  Education material provided:  General Meal Planning Guidelines Simple Meal Plan  If problems or questions, patient to contact team via:  Johny Drilling, [place], [place], CDE [phone]  Future DSME appointment: 4-6 wks  May 14, 2018 for Diabetes Class 1

## 2018-04-14 NOTE — Telephone Encounter (Signed)
Pt was seen this morning and scheduled diabetes classes starting Sept 26 but she has another appointment that morning. Spoke with her and have rescheduled classes beginning Oct 17.

## 2018-04-14 NOTE — Patient Instructions (Signed)
Exercise: Continue walking for 30-45  minutes  2-3 days a week and gradually increase to 150 minutes/week  Eat 3 meals day,  1  snack a day Space meals 4-6 hours apart Don't skip meals Avoid sugar sweetened drinks (soda, tea, juices)  Return for classes on:

## 2018-04-18 ENCOUNTER — Other Ambulatory Visit: Payer: Self-pay | Admitting: Family Medicine

## 2018-04-18 DIAGNOSIS — R232 Flushing: Secondary | ICD-10-CM

## 2018-04-21 ENCOUNTER — Ambulatory Visit (INDEPENDENT_AMBULATORY_CARE_PROVIDER_SITE_OTHER): Payer: BLUE CROSS/BLUE SHIELD | Admitting: Licensed Clinical Social Worker

## 2018-04-21 DIAGNOSIS — F411 Generalized anxiety disorder: Secondary | ICD-10-CM | POA: Diagnosis not present

## 2018-04-21 DIAGNOSIS — F331 Major depressive disorder, recurrent, moderate: Secondary | ICD-10-CM | POA: Diagnosis not present

## 2018-04-22 ENCOUNTER — Encounter: Payer: Self-pay | Admitting: Psychiatry

## 2018-04-22 ENCOUNTER — Ambulatory Visit (INDEPENDENT_AMBULATORY_CARE_PROVIDER_SITE_OTHER): Payer: BLUE CROSS/BLUE SHIELD | Admitting: Psychiatry

## 2018-04-22 ENCOUNTER — Other Ambulatory Visit: Payer: Self-pay

## 2018-04-22 VITALS — BP 119/75 | HR 83 | Temp 98.2°F | Wt 265.4 lb

## 2018-04-22 DIAGNOSIS — F3341 Major depressive disorder, recurrent, in partial remission: Secondary | ICD-10-CM | POA: Diagnosis not present

## 2018-04-22 DIAGNOSIS — F411 Generalized anxiety disorder: Secondary | ICD-10-CM

## 2018-04-22 DIAGNOSIS — F418 Other specified anxiety disorders: Secondary | ICD-10-CM | POA: Diagnosis not present

## 2018-04-22 MED ORDER — QUETIAPINE FUMARATE 100 MG PO TABS: 100.0000 mg | ORAL_TABLET | Freq: Every day | ORAL | 1 refills | Status: DC

## 2018-04-22 MED ORDER — HYDROXYZINE HCL 25 MG PO TABS: 25.0000 mg | ORAL_TABLET | Freq: Three times a day (TID) | ORAL | 1 refills | Status: DC | PRN

## 2018-04-22 MED ORDER — DULOXETINE HCL 30 MG PO CPEP: 30.0000 mg | ORAL_CAPSULE | Freq: Two times a day (BID) | ORAL | 1 refills | Status: DC

## 2018-04-22 NOTE — Progress Notes (Signed)
[place] MD OP Progress Note  04/22/2018 3:05 PM Beverly Barrett  MRN:  [id number]  Chief Complaint: ' I am here for follow up.' Chief Complaint    Follow-up; Medication Refill     HPI: Temprence is a 56 year old African-American female, married, lives in [place], has a history of depression, anxiety, chronic pain, arthritis, OSA on CPAP, hypothyroidism, presented to the clinic today for a follow-up visit.  Patient today reports her mood symptoms as fair.  She denies any significant depressive symptoms.  She reports her sadness, sleep problems and energy issues have improved.  She continues to be compliant with her Cymbalta and Seroquel.  Patient notes anxiety symptoms as improving.  She reports she does not use her Xanax as much and uses it 1-2 times a week and has been trying to cut down.  She reports she now makes use of hydroxyzine as needed for severe anxiety attacks.  She reports she likes the effect of hydroxyzine and has been tolerating it well.  Patient denies any suicidality.  Patient denies any homicidality.  Patient denies any perceptual disturbances.  Patient continues to be in psychotherapy sessions with Ms. Royal Piedra which is going well.   Visit Diagnosis:    ICD-10-CM   1. Major depressive disorder, recurrent episode, in partial remission with anxious distress (HCC) F33.41 DULoxetine (CYMBALTA) 30 MG capsule   F41.8 QUEtiapine (SEROQUEL) 100 MG tablet  2. GAD (generalized anxiety disorder) F41.1 DULoxetine (CYMBALTA) 30 MG capsule    hydrOXYzine (ATARAX/VISTARIL) 25 MG tablet    QUEtiapine (SEROQUEL) 100 MG tablet    Past Psychiatric History: Have reviewed past psychiatric history from my progress note on 01/21/2018.  Past trials of Seroquel, Cymbalta, Xanax  Past Medical History:  Past Medical History:  Diagnosis Date  . Anginal pain ([place])    none in approx 2 yrs  . Anxiety   . Asthma   . Chronic lower back pain   . Common migraine with intractable migraine  09/20/2016  . Depression    suicidal ideation  . Diabetes mellitus without complication ([place])   . GERD (gastroesophageal reflux disease)   . Headache    migraines -3x/wk  . Hyperlipidemia   . Hypertension   . Hypothyroidism   . Motion sickness    cars  . Multilevel degenerative disc disease   . Shortness of breath dyspnea   . Sleep apnea    CPAP  . Vertigo   . Wears dentures    partial upper    Past Surgical History:  Procedure Laterality Date  . ABDOMINAL HYSTERECTOMY    . bladder tach  1995  . CESAREAN SECTION    . COLONOSCOPY WITH PROPOFOL N/A 05/29/2015   Procedure: COLONOSCOPY WITH PROPOFOL;  Surgeon: Lucilla Lame, MD;  Location: [facility];  Service: Endoscopy;  Laterality: N/A;  Latex allergy sleep apnea - no CPAP machine (yet)  . ESOPHAGOGASTRODUODENOSCOPY N/A 04/23/2017   Procedure: ESOPHAGOGASTRODUODENOSCOPY (EGD);  Surgeon: Lin Landsman, MD;  Location: [facility];  Service: Gastroenterology;  Laterality: N/A;  Latex allergy sleep apnea  . POLYPECTOMY  05/29/2015   Procedure: POLYPECTOMY;  Surgeon: Lucilla Lame, MD;  Location: [facility];  Service: Endoscopy;;    Family Psychiatric History: Reviewed family psychiatric history from my progress note on 01/21/2018  Family History:  Family History  Problem Relation Age of Onset  . Diabetes Sister   . Anxiety disorder Sister   . Depression Sister   . Hypertension Sister   . Cirrhosis Mother   .  Diabetes Mother   . Cirrhosis Father   . Breast cancer Sister 63  . Heart attack Brother   . Alcohol abuse Brother   . Drug abuse Brother   . Prostate cancer Neg Hx   . Kidney cancer Neg Hx   . Bladder Cancer Neg Hx     Social History: Have reviewed social history from my progress note on 01/21/2018. Social History   Socioeconomic History  . Marital status: Married    Spouse name: clayton sr  . Number of children: 3  . Years of education: Not on file  . Highest education level:  GED or equivalent  Occupational History  . Not on file  Social Needs  . Financial resource strain: Very hard  . Food insecurity:    Worry: Often true    Inability: Often true  . Transportation needs:    Medical: No    Non-medical: No  Tobacco Use  . Smoking status: Never Smoker  . Smokeless tobacco: Never Used  Substance and Sexual Activity  . Alcohol use: No    Alcohol/week: 0.0 standard drinks  . Drug use: No  . Sexual activity: Yes    Partners: Male  Lifestyle  . Physical activity:    Days per week: 2 days    Minutes per session: 30 min  . Stress: Very much  Relationships  . Social connections:    Talks on phone: Three times a week    Gets together: Once a week    Attends religious service: More than 4 times per year    Active member of club or organization: No    Attends meetings of clubs or organizations: Never    Relationship status: Married  Other Topics Concern  . Not on file  Social History Narrative   Lives with husband and one daughter other kids are out of the house   She does not work, husband on disability     Allergies:  Allergies  Allergen Reactions  . Latex     Pt unsure of reaction pt states something happened while in the hospital after surgery.    Metabolic Disorder Labs: Lab Results  Component Value Date   HGBA1C 6.6 (A) 03/19/2018   MPG 143 08/18/2017   MPG 128 02/12/2017   No results found for: PROLACTIN Lab Results  Component Value Date   CHOL 159 03/23/2018   TRIG 211 (H) 03/23/2018   HDL 43 (L) 03/23/2018   CHOLHDL 3.7 03/23/2018   VLDL 44 (H) 02/12/2017   LDLCALC 85 03/23/2018   LDLCALC 99 08/18/2017   Lab Results  Component Value Date   TSH 4.53 (H) 03/23/2018   TSH 3.46 08/18/2017    Therapeutic Level Labs: No results found for: LITHIUM No results found for: VALPROATE No components found for:  CBMZ  Current Medications: Current Outpatient Medications  Medication Sig Dispense Refill  . ALPRAZolam (XANAX XR) 0.5  MG 24 hr tablet Take 1 tablet (0.5 mg total) by mouth daily. 30 tablet 2  . aspirin 81 MG EC tablet Take 81 mg by mouth daily.      Marland Kitchen atorvastatin (LIPITOR) 40 MG tablet TAKE 1 TABLET BY MOUTH AT BEDTIME. GENERIC EQUIVALENT FOR LIPITOR 90 tablet 1  . cloNIDine (CATAPRES) 0.1 MG tablet TAKE 1 TABLET BY MOUTH 2 TIMES DAILY. START AT NIGHT FOR HOT FLASHES AND TRY TAKING TWICE DAILY IF TOLERATED 180 tablet 0  . conjugated estrogens (PREMARIN) vaginal cream Place 1 Applicatorful vaginally daily. Apply 0.5mg  (pea-sized amount)  just inside the vaginal introitus with a finger-tip every night for two weeks and then Monday, Wednesday and Friday nights. 30 g 12  . cyclobenzaprine (FLEXERIL) 5 MG tablet Take 5 mg by mouth 3 (three) times daily as needed.   0  . diclofenac (CATAFLAM) 50 MG tablet Take 1 tablet (50 mg total) by mouth 3 (three) times daily as needed. 60 tablet 2  . DULoxetine (CYMBALTA) 30 MG capsule Take 1 capsule (30 mg total) by mouth 2 (two) times daily. 180 capsule 1  . EQ ALLERGY RELIEF 10 MG tablet TAKE ONE TABLET BY MOUTH ONCE DAILY 90 tablet 1  . fluticasone (FLONASE) 50 MCG/ACT nasal spray Place 2 sprays into both nostrils daily.     Marland Kitchen gabapentin (NEURONTIN) 300 MG capsule One capsule twice a day for 2 weeks, then take one in the morning and 2 in the evening. 90 capsule 3  . hydrocortisone (ANUSOL-HC) 2.5 % rectal cream Place rectally 2 (two) times daily. (Patient taking differently: Place 1 application rectally 2 (two) times daily as needed. ) 30 g 0  . hydrOXYzine (ATARAX/VISTARIL) 25 MG tablet Take 1 tablet (25 mg total) by mouth 3 (three) times daily as needed (for severe anxiety only). 270 tablet 1  . levothyroxine (SYNTHROID, LEVOTHROID) 88 MCG tablet Take 1 tablet (88 mcg total) by mouth daily before breakfast. Take one and a half on Sundays Sandoz brand please 100 tablet 0  . linaclotide (LINZESS) 145 MCG CAPS capsule TAKE 1 CAPSULE BY MOUTH ONCE DAILY BEFORE BREAKFAST 90 capsule  1  . metoprolol succinate (TOPROL-XL) 50 MG 24 hr tablet TAKE 1 TABLET BY MOUTH DAILY 90 tablet 1  . mometasone (ASMANEX 120 METERED DOSES) 220 MCG/INH inhaler Inhale 2 puffs into the lungs daily. 1 Inhaler 12  . montelukast (SINGULAIR) 10 MG tablet Take 1 tablet (10 mg total) by mouth at bedtime. 90 tablet 1  . nitroGLYCERIN (NITROSTAT) 0.4 MG SL tablet Place 1 tablet (0.4 mg total) under the tongue every 5 (five) minutes as needed. 25 tablet 0  . olmesartan-hydrochlorothiazide (BENICAR HCT) 40-25 MG tablet Take 1 tablet by mouth daily. 90 tablet 1  . omeprazole (PRILOSEC) 40 MG capsule TAKE 1 CAPSULE(40MG  TOTAL) BY MOUTH DAILY 90 capsule 0  . QUEtiapine (SEROQUEL) 100 MG tablet Take 1 tablet (100 mg total) by mouth at bedtime. 90 tablet 1  . rizatriptan (MAXALT) 10 MG tablet Take 1 tablet (10 mg total) by mouth 3 (three) times daily as needed for migraine. 10 tablet 5  . Trospium Chloride 60 MG CP24 Take 1 capsule (60 mg total) by mouth daily. 90 each 3   No current facility-administered medications for this visit.    Facility-Administered Medications Ordered in Other Visits  Medication Dose Route Frequency Provider Last Rate Last Dose  . gadopentetate dimeglumine (MAGNEVIST) injection 20 mL  20 mL Intravenous Once PRN Ward Givens, NP         Musculoskeletal: Strength & Muscle Tone: within normal limits Gait & Station: normal Patient leans: N/A  Psychiatric Specialty Exam: Review of Systems  Psychiatric/Behavioral: The patient is nervous/anxious.   All other systems reviewed and are negative.   Blood pressure 119/75, pulse 83, temperature 98.2 F (36.8 C), temperature source Oral, weight 265 lb 6.4 oz (120.4 kg).Body mass index is 44.16 kg/m.  General Appearance: Casual  Eye Contact:  Fair  Speech:  Clear and Coherent  Volume:  Normal  Mood:  Anxious  Affect:  Congruent  Thought Process:  Goal  Directed and Descriptions of Associations: Intact  Orientation:  Full (Time,  Place, and Person)  Thought Content: Logical   Suicidal Thoughts:  No  Homicidal Thoughts:  No  Memory:  Immediate;   Fair Recent;   Fair Remote;   Fair  Judgement:  Fair  Insight:  Fair  Psychomotor Activity:  Normal  Concentration:  Concentration: Fair and Attention Span: Fair  Recall:  [employer] of Knowledge: Fair  Language: Fair  Akathisia:  No  Handed:  Right  AIMS (if indicated): 0  Assets:  Communication Skills Desire for Improvement Social Support  ADL's:  Intact  Cognition: WNL  Sleep:  Fair   Screenings: GAD-7     Office Visit from 03/19/2018 in [facility] Office Visit from 03/04/2016 in [facility] Office Visit from 12/20/2015 in [facility]  Total GAD-7 Score  14  18  18     PHQ2-9     Nutrition from 04/14/2018 in [facility] Office Visit from 03/19/2018 in [facility] Office Visit from 12/17/2017 in [facility] Office Visit from 08/18/2017 in [facility] Office Visit from 02/12/2017 in [facility]  PHQ-2 Total Score  4  4  1  5   0  PHQ-9 Total Score  14  14  10  21   -       Assessment and Plan: Kathleene is a 56 year old African-American female who is married, unemployed, lives in [place], has a history of depression, anxiety, insomnia, OSA on CPAP, hypothyroidism, arthritis, presented to the clinic today for a follow-up visit.  Patient continues to remain compliant with medications and reports mood symptoms are stable.  Will continue psychotherapy sessions.  Plan as noted below.  Plan MDD Continue Cymbalta 30 mg p.o. twice daily Seroquel 100 mg p.o. nightly [place] equals 0  For GAD Cymbalta 30 mg p.o. twice daily She continues to cut down on Xanax and uses it once or twice per week.  She has been making use of hydroxyzine 25 mg p.o. 3 times daily as needed GAD 7 - 11  Pt will continue psychotherapy  sessions with Ms. Peacock.  More than 50 % of the time was spent for psychoeducation and supportive psychotherapy and care coordination.  This note was generated in part or whole with voice recognition software. Voice recognition is usually quite accurate but there are transcription errors that can and very often do occur. I apologize for any typographical errors that were not detected and corrected.       Ursula Alert, MD 04/22/2018, 3:05 PM

## 2018-04-23 ENCOUNTER — Ambulatory Visit: Payer: BLUE CROSS/BLUE SHIELD | Admitting: Licensed Clinical Social Worker

## 2018-04-28 ENCOUNTER — Ambulatory Visit: Payer: BLUE CROSS/BLUE SHIELD | Admitting: Urology

## 2018-04-28 ENCOUNTER — Encounter: Payer: Self-pay | Admitting: Urology

## 2018-04-28 VITALS — BP 136/85 | HR 86 | Ht 65.0 in | Wt 265.2 lb

## 2018-04-28 DIAGNOSIS — N3946 Mixed incontinence: Secondary | ICD-10-CM | POA: Diagnosis not present

## 2018-04-28 DIAGNOSIS — N952 Postmenopausal atrophic vaginitis: Secondary | ICD-10-CM | POA: Diagnosis not present

## 2018-04-28 LAB — BLADDER SCAN AMB NON-IMAGING
SCAN RESULT: 0
Scan Result: 0

## 2018-04-28 NOTE — Progress Notes (Signed)
04/28/2018 10:18 AM   Beverly Barrett 27-May-1961 [id number]  Referring provider: Steele Sizer, MD [address] [place], [place] [address]  No chief complaint on file.   HPI: 57 yo AAF with mixed urinary incontinence and vaginal atrophy who presents today for a 6 week follow up after a trial of Sanctura.    Mixed urinary incontinence The patient has been experiencing urgency (4-7), frequency x 8 or more (worse), she is restricting fluid in an effort not to make the trips to the bathroom, she engages in toilet mapping, incontinence x 0-3 and nocturia x 0-3 (improved) and post void dribbling.  Patient denies any gross hematuria, dysuria or suprapubic/flank pain.  Patient denies any fevers, chills, nausea or vomiting.  Her PVR is 0 mL.  She feels the Cloyd Stagers is working and would like to stick with it.  She still feels the Myrbetriq was more effective, but it is cost prohibitive for her at this time.  Vaginal atrophy She is applying the vaginal cream 3 nights weekly. She's not had vaginal irritation or rash.  PMH: Past Medical History:  Diagnosis Date  . Anginal pain ([place])    none in approx 2 yrs  . Anxiety   . Asthma   . Chronic lower back pain   . Common migraine with intractable migraine 09/20/2016  . Depression    suicidal ideation  . Diabetes mellitus without complication ([place])   . GERD (gastroesophageal reflux disease)   . Headache    migraines -3x/wk  . Hyperlipidemia   . Hypertension   . Hypothyroidism   . Motion sickness    cars  . Multilevel degenerative disc disease   . Shortness of breath dyspnea   . Sleep apnea    CPAP  . Vertigo   . Wears dentures    partial upper    Surgical History: Past Surgical History:  Procedure Laterality Date  . ABDOMINAL HYSTERECTOMY    . bladder tach  1995  . CESAREAN SECTION    . COLONOSCOPY WITH PROPOFOL N/A 05/29/2015   Procedure: COLONOSCOPY WITH PROPOFOL;  Surgeon: Lucilla Lame, MD;  Location: [facility];  Service: Endoscopy;  Laterality: N/A;  Latex allergy sleep apnea - no CPAP machine (yet)  . ESOPHAGOGASTRODUODENOSCOPY N/A 04/23/2017   Procedure: ESOPHAGOGASTRODUODENOSCOPY (EGD);  Surgeon: Lin Landsman, MD;  Location: [facility];  Service: Gastroenterology;  Laterality: N/A;  Latex allergy sleep apnea  . POLYPECTOMY  05/29/2015   Procedure: POLYPECTOMY;  Surgeon: Lucilla Lame, MD;  Location: [facility];  Service: Endoscopy;;    Home Medications:  Allergies as of 04/28/2018      Reactions   Latex    Pt unsure of reaction pt states something happened while in the hospital after surgery.      Medication List        Accurate as of 04/28/18 10:18 AM. Always use your most recent med list.          ALPRAZolam 0.5 MG 24 hr tablet Commonly known as:  XANAX XR Take 1 tablet (0.5 mg total) by mouth daily.   aspirin 81 MG EC tablet Take 81 mg by mouth daily.   atorvastatin 40 MG tablet Commonly known as:  LIPITOR TAKE 1 TABLET BY MOUTH AT BEDTIME. GENERIC EQUIVALENT FOR LIPITOR   cloNIDine 0.1 MG tablet Commonly known as:  CATAPRES TAKE 1 TABLET BY MOUTH 2 TIMES DAILY. START AT NIGHT FOR HOT FLASHES AND TRY TAKING TWICE DAILY IF TOLERATED  conjugated estrogens vaginal cream Commonly known as:  PREMARIN Place 1 Applicatorful vaginally daily. Apply 0.5mg  (pea-sized amount)  just inside the vaginal introitus with a finger-tip every night for two weeks and then Monday, Wednesday and Friday nights.   cyclobenzaprine 5 MG tablet Commonly known as:  FLEXERIL Take 5 mg by mouth 3 (three) times daily as needed.   diclofenac 50 MG tablet Commonly known as:  CATAFLAM Take 1 tablet (50 mg total) by mouth 3 (three) times daily as needed.   DULoxetine 30 MG capsule Commonly known as:  CYMBALTA Take 1 capsule (30 mg total) by mouth 2 (two) times daily.   EQ ALLERGY RELIEF 10 MG tablet Generic drug:  loratadine TAKE ONE TABLET BY MOUTH ONCE DAILY     fluticasone 50 MCG/ACT nasal spray Commonly known as:  FLONASE Place 2 sprays into both nostrils daily.   gabapentin 300 MG capsule Commonly known as:  NEURONTIN One capsule twice a day for 2 weeks, then take one in the morning and 2 in the evening.   hydrocortisone 2.5 % rectal cream Commonly known as:  ANUSOL-HC Place rectally 2 (two) times daily.   hydrOXYzine 25 MG tablet Commonly known as:  ATARAX/VISTARIL Take 1 tablet (25 mg total) by mouth 3 (three) times daily as needed (for severe anxiety only).   levothyroxine 88 MCG tablet Commonly known as:  SYNTHROID, LEVOTHROID Take 1 tablet (88 mcg total) by mouth daily before breakfast. Take one and a half on Sundays Sandoz brand please   linaclotide 145 MCG Caps capsule Commonly known as:  LINZESS TAKE 1 CAPSULE BY MOUTH ONCE DAILY BEFORE BREAKFAST   metoprolol succinate 50 MG 24 hr tablet Commonly known as:  TOPROL-XL TAKE 1 TABLET BY MOUTH DAILY   mometasone 220 MCG/INH inhaler Commonly known as:  ASMANEX Inhale 2 puffs into the lungs daily.   montelukast 10 MG tablet Commonly known as:  SINGULAIR Take 1 tablet (10 mg total) by mouth at bedtime.   nitroGLYCERIN 0.4 MG SL tablet Commonly known as:  NITROSTAT Place 1 tablet (0.4 mg total) under the tongue every 5 (five) minutes as needed.   olmesartan-hydrochlorothiazide 40-25 MG tablet Commonly known as:  BENICAR HCT Take 1 tablet by mouth daily.   omeprazole 40 MG capsule Commonly known as:  PRILOSEC TAKE 1 CAPSULE(40MG  TOTAL) BY MOUTH DAILY   QUEtiapine 100 MG tablet Commonly known as:  SEROQUEL Take 1 tablet (100 mg total) by mouth at bedtime.   rizatriptan 10 MG tablet Commonly known as:  MAXALT Take 1 tablet (10 mg total) by mouth 3 (three) times daily as needed for migraine.   Trospium Chloride 60 MG Cp24 Take 1 capsule (60 mg total) by mouth daily.       Allergies:  Allergies  Allergen Reactions  . Latex     Pt unsure of reaction pt states  something happened while in the hospital after surgery.    Family History: Family History  Problem Relation Age of Onset  . Diabetes Sister   . Anxiety disorder Sister   . Depression Sister   . Hypertension Sister   . Cirrhosis Mother   . Diabetes Mother   . Cirrhosis Father   . Breast cancer Sister 67  . Heart attack Brother   . Alcohol abuse Brother   . Drug abuse Brother   . Prostate cancer Neg Hx   . Kidney cancer Neg Hx   . Bladder Cancer Neg Hx     Social History:  reports that she has never smoked. She has never used smokeless tobacco. She reports that she does not drink alcohol or use drugs.  ROS: UROLOGY Frequent Urination?: No Hard to postpone urination?: No Burning/pain with urination?: No Get up at night to urinate?: Yes Leakage of urine?: No Urine stream starts and stops?: No Trouble starting stream?: No Do you have to strain to urinate?: No Blood in urine?: No Urinary tract infection?: No Sexually transmitted disease?: No Injury to kidneys or bladder?: No Painful intercourse?: No Weak stream?: No Currently pregnant?: No Vaginal bleeding?: No Last menstrual period?: n  Gastrointestinal Nausea?: Yes Vomiting?: No Indigestion/heartburn?: No Diarrhea?: No Constipation?: No  Constitutional Fever: No Night sweats?: No Weight loss?: No Fatigue?: No  Skin Skin rash/lesions?: No Itching?: No  Eyes Blurred vision?: No Double vision?: No  Ears/Nose/Throat Sore throat?: No Sinus problems?: Yes  Hematologic/Lymphatic Swollen glands?: No Easy bruising?: No  Cardiovascular Leg swelling?: Yes Chest pain?: No  Respiratory Cough?: No Shortness of breath?: Yes  Endocrine Excessive thirst?: No  Musculoskeletal Back pain?: Yes Joint pain?: Yes  Neurological Headaches?: Yes Dizziness?: Yes  Psychologic Depression?: Yes Anxiety?: Yes  Physical Exam: BP 136/85 (BP Location: Left Arm, Patient Position: Sitting, Cuff Size: Large)    Pulse 86   Ht 5\' 5"  (1.651 m)   Wt 265 lb 3.2 oz (120.3 kg)   BMI 44.13 kg/m   Constitutional: Well nourished. Alert and oriented, No acute distress. HEENT: [place] AT, moist mucus membranes. Trachea midline, no masses. Cardiovascular: No clubbing, cyanosis, or edema. Respiratory: Normal respiratory effort, no increased work of breathing. Skin: No rashes, bruises or suspicious lesions. Lymph: No cervical or inguinal adenopathy. Neurologic: Grossly intact, no focal deficits, moving all 4 extremities. Psychiatric: Normal mood and affect.  Laboratory Data: Lab Results  Component Value Date   WBC 10.0 03/23/2018   HGB 10.9 (L) 03/23/2018   HCT 32.7 (L) 03/23/2018   MCV 83.2 03/23/2018   PLT 308 03/23/2018    Lab Results  Component Value Date   CREATININE 0.84 03/23/2018     Lab Results  Component Value Date   HGBA1C 6.6 (A) 03/19/2018    Lab Results  Component Value Date   TSH 4.53 (H) 03/23/2018       Component Value Date/Time   CHOL 159 03/23/2018 1024   CHOL 192 12/20/2015 1012   HDL 43 (L) 03/23/2018 1024   HDL 46 12/20/2015 1012   CHOLHDL 3.7 03/23/2018 1024   VLDL 44 (H) 02/12/2017 1059   LDLCALC 85 03/23/2018 1024    Lab Results  Component Value Date   AST 16 03/23/2018   Lab Results  Component Value Date   ALT 11 03/23/2018   I have reviewed the labs  Pertinent Imaging: Results for BRIANA, FARNER (MRN [id number]) as of 05/05/2018 16:40  Ref. Range 04/28/2018 10:22  Scan Result Unknown 0     Assessment & Plan:    1. Mixed urinary Incontinence Continue Sanctura XL 60 mg daily Return to clinic in 1 year for OAB questionnaire and PVR                               2. Vaginal atrophy Continue Premarin cream 3 nights weekly She'll return to clinic in 12 months for exam  Return in about 1 year (around 04/29/2019) for OAB questionnaire, PVR and exam.  These notes generated with voice recognition software. I apologize for typographical  errors.  Zara Council, PA-C  [facility] [address] [place], [place] [address] [phone]

## 2018-05-06 NOTE — Progress Notes (Signed)
   THERAPIST PROGRESS NOTE  Session Time: 63min  Participation Level: Active  Behavioral Response: CasualAlertEuthymic  Type of Therapy: Individual Therapy  Treatment Goals addressed: Coping and Diagnosis: Depression & Anxiety  Interventions: CBT and Motivational Interviewing  Summary: Meryl Hubers is a 56 y.o. female who presents with continued symptoms of diagnosis.  Patient reports a "favorable" mood.  Patient reports that she has used copings skills taught prior and noticed a decrease in symptoms. Explored what has worked and what hasn't.   Discussed diagnosis and symptoms of each and discussed journaling.  Suicidal/Homicidal: No  Plan: Return again in 2 weeks.  Diagnosis: Axis I: Generalized Anxiety Disorder and Depression    Axis II: No diagnosis    Lubertha South, LCSW 04/21/2018

## 2018-05-09 ENCOUNTER — Other Ambulatory Visit: Payer: Self-pay | Admitting: Family Medicine

## 2018-05-09 DIAGNOSIS — E034 Atrophy of thyroid (acquired): Secondary | ICD-10-CM

## 2018-05-10 NOTE — Telephone Encounter (Signed)
She needs TSH.

## 2018-05-14 ENCOUNTER — Encounter: Payer: BLUE CROSS/BLUE SHIELD | Attending: Family Medicine | Admitting: Dietician

## 2018-05-14 ENCOUNTER — Encounter: Payer: Self-pay | Admitting: Dietician

## 2018-05-14 VITALS — Ht 65.0 in | Wt 264.0 lb

## 2018-05-14 DIAGNOSIS — E785 Hyperlipidemia, unspecified: Secondary | ICD-10-CM | POA: Insufficient documentation

## 2018-05-14 DIAGNOSIS — E119 Type 2 diabetes mellitus without complications: Secondary | ICD-10-CM

## 2018-05-14 DIAGNOSIS — Z713 Dietary counseling and surveillance: Secondary | ICD-10-CM | POA: Insufficient documentation

## 2018-05-14 DIAGNOSIS — E1169 Type 2 diabetes mellitus with other specified complication: Secondary | ICD-10-CM | POA: Diagnosis not present

## 2018-05-14 NOTE — Progress Notes (Signed)

## 2018-05-19 ENCOUNTER — Ambulatory Visit (INDEPENDENT_AMBULATORY_CARE_PROVIDER_SITE_OTHER): Payer: BLUE CROSS/BLUE SHIELD | Admitting: Licensed Clinical Social Worker

## 2018-05-19 DIAGNOSIS — F418 Other specified anxiety disorders: Secondary | ICD-10-CM | POA: Diagnosis not present

## 2018-05-19 DIAGNOSIS — F411 Generalized anxiety disorder: Secondary | ICD-10-CM

## 2018-05-19 DIAGNOSIS — F3341 Major depressive disorder, recurrent, in partial remission: Secondary | ICD-10-CM

## 2018-05-21 ENCOUNTER — Encounter: Payer: Self-pay | Admitting: *Deleted

## 2018-05-21 ENCOUNTER — Encounter: Payer: BLUE CROSS/BLUE SHIELD | Admitting: *Deleted

## 2018-05-21 VITALS — Wt 265.4 lb

## 2018-05-21 DIAGNOSIS — E119 Type 2 diabetes mellitus without complications: Secondary | ICD-10-CM

## 2018-05-21 DIAGNOSIS — E1169 Type 2 diabetes mellitus with other specified complication: Secondary | ICD-10-CM | POA: Diagnosis not present

## 2018-05-21 NOTE — Progress Notes (Signed)

## 2018-05-25 NOTE — Progress Notes (Signed)
   THERAPIST PROGRESS NOTE  Session Time: 40min  Participation Level: Active  Behavioral Response: CasualAlertEuthymic  Type of Therapy: Individual Therapy  Treatment Goals addressed: Coping  Interventions: CBT and Motivational Interviewing  Summary: Beverly Barrett is a 57 y.o. female who presents with continued symptoms of her diagnosis.  Therapist initiated today's visit by probing and eliciting information from Patient about her problematic behaviors to determine her progress on the goal. Therapist educated Patient on her diagnosis and the symptoms that she displays. Therapist questioned Patient about her perspective on whether or not she's made progress on her treatment goals. Therapist facilitated a therapeutic activity with Patient to assist her with recognizing and identifying her negative thinking patterns and to assist with increasing her positive thinking skills. Therapist reviewed, explained and summarized with Patient all objectives learned and asked questions for clarification. Therapist reviewed and educated Patient on tools and coping skills to assist Patient with working towards achieving her treatment goals.   Suicidal/Homicidal: No  Plan: Return again in2 weeks.  Diagnosis: Axis I: Depression    Axis II: No diagnosis    Lubertha South, LCSW 05/19/2018

## 2018-05-28 ENCOUNTER — Encounter: Payer: BLUE CROSS/BLUE SHIELD | Admitting: Dietician

## 2018-05-28 ENCOUNTER — Encounter: Payer: Self-pay | Admitting: Dietician

## 2018-05-28 VITALS — BP 136/88 | Ht 65.0 in | Wt 261.4 lb

## 2018-05-28 DIAGNOSIS — E1169 Type 2 diabetes mellitus with other specified complication: Secondary | ICD-10-CM | POA: Diagnosis not present

## 2018-05-28 DIAGNOSIS — E119 Type 2 diabetes mellitus without complications: Secondary | ICD-10-CM

## 2018-05-28 NOTE — Progress Notes (Signed)

## 2018-06-01 ENCOUNTER — Other Ambulatory Visit: Payer: Self-pay

## 2018-06-01 ENCOUNTER — Ambulatory Visit: Payer: BLUE CROSS/BLUE SHIELD | Admitting: Neurology

## 2018-06-01 ENCOUNTER — Encounter: Payer: Self-pay | Admitting: Neurology

## 2018-06-01 VITALS — BP 128/79 | HR 74 | Resp 20 | Ht 65.0 in | Wt 266.0 lb

## 2018-06-01 DIAGNOSIS — G43019 Migraine without aura, intractable, without status migrainosus: Secondary | ICD-10-CM | POA: Diagnosis not present

## 2018-06-01 MED ORDER — GABAPENTIN 300 MG PO CAPS: ORAL_CAPSULE | ORAL | 3 refills | Status: DC

## 2018-06-01 NOTE — Progress Notes (Signed)
Reason for visit: Migraine headache  Beverly Barrett is an 57 y.o. female  History of present illness:  Ms. Leth is a 57 year old right-handed black female with a history of morbid obesity and a new diagnosis of diabetes.  The patient has a history of migraine headache, her headache had been daily in nature, gabapentin was added and the dose was increased.  The patient currently is on a total of 900 mg daily, she believes that this has had a significant beneficial effect for her headache.  The patient in the past is tried Topamax, she remains on Cymbalta, Flexeril, and Seroquel.  The patient is also on metoprolol.  The patient has gone from having daily headaches to one headache a week, when she takes her Maxalt and lies down for short period time, the headache is usually gone in a half an hour.  The patient is pleased with her progress with the headache.  The patient is sleeping fairly well.  She is yet to go on medications for diabetes.  Past Medical History:  Diagnosis Date  . Anginal pain ([place])    none in approx 2 yrs  . Anxiety   . Asthma   . Chronic lower back pain   . Common migraine with intractable migraine 09/20/2016  . Depression    suicidal ideation  . Diabetes mellitus without complication ([place])   . GERD (gastroesophageal reflux disease)   . Headache    migraines -3x/wk  . Hyperlipidemia   . Hypertension   . Hypothyroidism   . Motion sickness    cars  . Multilevel degenerative disc disease   . Shortness of breath dyspnea   . Sleep apnea    CPAP  . Vertigo   . Wears dentures    partial upper    Past Surgical History:  Procedure Laterality Date  . ABDOMINAL HYSTERECTOMY    . bladder tach  1995  . CESAREAN SECTION    . COLONOSCOPY WITH PROPOFOL N/A 05/29/2015   Procedure: COLONOSCOPY WITH PROPOFOL;  Surgeon: Lucilla Lame, MD;  Location: [facility];  Service: Endoscopy;  Laterality: N/A;  Latex allergy sleep apnea - no CPAP machine (yet)  .  ESOPHAGOGASTRODUODENOSCOPY N/A 04/23/2017   Procedure: ESOPHAGOGASTRODUODENOSCOPY (EGD);  Surgeon: Lin Landsman, MD;  Location: [facility];  Service: Gastroenterology;  Laterality: N/A;  Latex allergy sleep apnea  . POLYPECTOMY  05/29/2015   Procedure: POLYPECTOMY;  Surgeon: Lucilla Lame, MD;  Location: [place] [place];  Service: Endoscopy;;    Family History  Problem Relation Age of Onset  . Diabetes Sister   . Anxiety disorder Sister   . Depression Sister   . Hypertension Sister   . Cirrhosis Mother   . Diabetes Mother   . Cirrhosis Father   . Breast cancer Sister 47  . Heart attack Brother   . Alcohol abuse Brother   . Drug abuse Brother   . Prostate cancer Neg Hx   . Kidney cancer Neg Hx   . Bladder Cancer Neg Hx     Social history:  reports that she has never smoked. She has never used smokeless tobacco. She reports that she does not drink alcohol or use drugs.    Allergies  Allergen Reactions  . Latex     Pt unsure of reaction pt states something happened while in the hospital after surgery.    Medications:  Prior to Admission medications   Medication Sig Start Date End Date Taking? Authorizing Provider  ALPRAZolam Duanne Moron  XR) 0.5 MG 24 hr tablet Take 1 tablet (0.5 mg total) by mouth daily. 12/17/17  Yes Steele Sizer, MD  aspirin 81 MG EC tablet Take 81 mg by mouth daily.     Yes [provider]  atorvastatin (LIPITOR) 40 MG tablet TAKE 1 TABLET BY MOUTH AT BEDTIME. GENERIC EQUIVALENT FOR LIPITOR 12/17/17  Yes Sowles, Drue Stager, MD  cloNIDine (CATAPRES) 0.1 MG tablet TAKE 1 TABLET BY MOUTH 2 TIMES DAILY. START AT NIGHT FOR HOT FLASHES AND TRY TAKING TWICE DAILY IF TOLERATED 04/18/18  Yes Sowles, Drue Stager, MD  conjugated estrogens (PREMARIN) vaginal cream Place 1 Applicatorful vaginally daily. Apply 0.5mg  (pea-sized amount)  just inside the vaginal introitus with a finger-tip every night for two weeks and then Monday, Wednesday and Friday nights.  02/24/18  Yes Hollice Espy, MD  cyclobenzaprine (FLEXERIL) 5 MG tablet Take 5 mg by mouth 3 (three) times daily as needed.  04/18/17  Yes Duanne Guess, PA-C  diclofenac (CATAFLAM) 50 MG tablet Take 1 tablet (50 mg total) by mouth 3 (three) times daily as needed. 02/25/18  Yes Kathrynn Ducking, MD  DULoxetine (CYMBALTA) 30 MG capsule Take 1 capsule (30 mg total) by mouth 2 (two) times daily. 04/22/18  Yes Ursula Alert, MD  EQ ALLERGY RELIEF 10 MG tablet TAKE ONE TABLET BY MOUTH ONCE DAILY 07/25/15  Yes Bobetta Lime, MD  fluticasone (FLONASE) 50 MCG/ACT nasal spray Place 2 sprays into both nostrils daily.  04/11/14  Yes [provider]  gabapentin (NEURONTIN) 300 MG capsule One in the morning and 2 in the evening. 06/01/18  Yes Kathrynn Ducking, MD  hydrocortisone (ANUSOL-HC) 2.5 % rectal cream Place rectally 2 (two) times daily. Patient taking differently: Place 1 application rectally 2 (two) times daily as needed.  03/19/18  Yes Sowles, Drue Stager, MD  hydrOXYzine (ATARAX/VISTARIL) 25 MG tablet Take 1 tablet (25 mg total) by mouth 3 (three) times daily as needed (for severe anxiety only). 04/22/18  Yes Ursula Alert, MD  levothyroxine (SYNTHROID, LEVOTHROID) 88 MCG tablet Take 1 tablet (88 mcg total) by mouth daily before breakfast. Take one and a half on Sundays Sandoz brand please 03/24/18  Yes Sowles, Drue Stager, MD  linaclotide [facility]) 145 MCG CAPS capsule TAKE 1 CAPSULE BY MOUTH ONCE DAILY BEFORE BREAKFAST 12/17/17  Yes Sowles, Drue Stager, MD  metoprolol succinate (TOPROL-XL) 50 MG 24 hr tablet TAKE 1 TABLET BY MOUTH DAILY 12/17/17  Yes Sowles, Drue Stager, MD  mometasone (ASMANEX 120 METERED DOSES) 220 MCG/INH inhaler Inhale 2 puffs into the lungs daily. 08/18/17  Yes Sowles, Drue Stager, MD  montelukast (SINGULAIR) 10 MG tablet Take 1 tablet (10 mg total) by mouth at bedtime. 12/17/17  Yes Sowles, Drue Stager, MD  nitroGLYCERIN (NITROSTAT) 0.4 MG SL tablet Place 1 tablet (0.4 mg total) under the  tongue every 5 (five) minutes as needed. 02/20/17  Yes Gollan, Kathlene November, MD  olmesartan-hydrochlorothiazide (BENICAR HCT) 40-25 MG tablet Take 1 tablet by mouth daily. 12/17/17  Yes Sowles, Drue Stager, MD  omeprazole (PRILOSEC) 40 MG capsule TAKE 1 CAPSULE(40MG  TOTAL) BY MOUTH DAILY 03/15/18  Yes Sowles, Drue Stager, MD  QUEtiapine (SEROQUEL) 100 MG tablet Take 1 tablet (100 mg total) by mouth at bedtime. 04/22/18  Yes Ursula Alert, MD  rizatriptan (MAXALT) 10 MG tablet Take 1 tablet (10 mg total) by mouth 3 (three) times daily as needed for migraine. 02/25/18  Yes Kathrynn Ducking, MD  Trospium Chloride 60 MG CP24 Take 1 capsule (60 mg total) by mouth daily. 03/17/18  Yes  McGowan, Shannon A, PA-C    ROS:  Out of a complete 14 system review of symptoms, the patient complains only of the following symptoms, and all other reviewed systems are negative.  Shortness of breath Eye itching Excessive thirst Urinary urgency Back pain, walking difficulty  Blood pressure 128/79, pulse 74, resp. rate 20, height 5\' 5"  (1.651 m), weight 266 lb (120.7 kg).  Physical Exam  General: The patient is alert and cooperative at the time of the examination.  The patient is markedly obese.  Skin: No significant peripheral edema is noted.   Neurologic Exam  Mental status: The patient is alert and oriented x 3 at the time of the examination. The patient has apparent normal recent and remote memory, with an apparently normal attention span and concentration ability.   Cranial nerves: Facial symmetry is present. Speech is normal, no aphasia or dysarthria is noted. Extraocular movements are full. Visual fields are full.  Motor: The patient has good strength in all 4 extremities.  Sensory examination: Soft touch sensation is symmetric on the face, arms, and legs.  Coordination: The patient has good finger-nose-finger and heel-to-shin bilaterally.  Gait and station: The patient has a normal gait. Tandem gait is  normal. Romberg is negative. No drift is seen.  Reflexes: Deep tendon reflexes are symmetric.   Assessment/Plan:  1.  Migraine headache  The patient is doing much better with her headaches, she has gone from daily headaches to one headache a week, the headaches are short-lived if she takes her Maxalt quickly.  She will continue her medication, she will call for any dose adjustments of the gabapentin.  She will follow-up in 6 months.  Jill Alexanders MD 06/01/2018 8:11 AM  [facility] [address] [place], [place] [address]  Phone [phone] Fax [phone]

## 2018-06-05 ENCOUNTER — Other Ambulatory Visit: Payer: Self-pay | Admitting: Family Medicine

## 2018-06-05 DIAGNOSIS — K219 Gastro-esophageal reflux disease without esophagitis: Secondary | ICD-10-CM

## 2018-06-09 ENCOUNTER — Encounter: Payer: Self-pay | Admitting: *Deleted

## 2018-06-16 ENCOUNTER — Ambulatory Visit: Payer: BLUE CROSS/BLUE SHIELD | Admitting: Licensed Clinical Social Worker

## 2018-06-16 DIAGNOSIS — F418 Other specified anxiety disorders: Principal | ICD-10-CM

## 2018-06-16 DIAGNOSIS — F3341 Major depressive disorder, recurrent, in partial remission: Secondary | ICD-10-CM

## 2018-06-16 DIAGNOSIS — F411 Generalized anxiety disorder: Secondary | ICD-10-CM

## 2018-07-05 ENCOUNTER — Other Ambulatory Visit: Payer: Self-pay | Admitting: Neurology

## 2018-07-15 ENCOUNTER — Other Ambulatory Visit: Payer: Self-pay

## 2018-07-15 ENCOUNTER — Encounter: Payer: Self-pay | Admitting: Psychiatry

## 2018-07-15 ENCOUNTER — Ambulatory Visit: Payer: BLUE CROSS/BLUE SHIELD | Admitting: Psychiatry

## 2018-07-15 VITALS — BP 145/87 | HR 84 | Temp 98.1°F | Wt 268.2 lb

## 2018-07-15 DIAGNOSIS — F411 Generalized anxiety disorder: Secondary | ICD-10-CM

## 2018-07-15 DIAGNOSIS — F3341 Major depressive disorder, recurrent, in partial remission: Secondary | ICD-10-CM

## 2018-07-15 DIAGNOSIS — F418 Other specified anxiety disorders: Secondary | ICD-10-CM

## 2018-07-15 MED ORDER — QUETIAPINE FUMARATE 50 MG PO TABS: 50.0000 mg | ORAL_TABLET | Freq: Every day | ORAL | 0 refills | Status: DC

## 2018-07-15 MED ORDER — DULOXETINE HCL 20 MG PO CPEP: 20.0000 mg | ORAL_CAPSULE | Freq: Every day | ORAL | 1 refills | Status: DC

## 2018-07-15 NOTE — Progress Notes (Signed)
[facility] MD OP Progress Note  07/15/2018 12:31 PM Beverly Barrett  MRN:  [id number]  Chief Complaint: ' I am here for follow up." Chief Complaint    Follow-up; Medication Refill     HPI: Beverly Barrett is a 57 year old African-American female, married, lives in [place], has a history of depression, anxiety, chronic pain, arthritis, OSA on CPAP, hypothyroidism, presented to the clinic today for a follow-up visit.  Patient today reports she continues to struggle with anxiety symptoms on a regular basis.  She reports she has been taking hydroxyzine up to 3 times a day at least 4 times a week.  She reports she does not have any new psychosocial stressors however feels anxious and on edge.  She has been compliant with her Cymbalta.  She denies any side effects to the Cymbalta.  Will increase the dosage of Cymbalta today.  Patient reports sleep is good.  Patient reports appetite is fair.  Patient denies any suicidality, homicidality or perceptual disturbances.  Patient does report some dry mouth however not sure what could be causing it.  Discussed with patient that any of her medications could contribute to it.  However discussed reducing the dosage of Seroquel at bedtime to see if that will help.  She agrees with plan.  Patient continues to be in psychotherapy sessions with Ms. Peacock which she says are beneficial.  Discussed with her to have more frequent therapy sessions. Visit Diagnosis:    ICD-10-CM   1. Major depressive disorder, recurrent episode, in partial remission with anxious distress ([place]) F33.41    F41.8   2. GAD (generalized anxiety disorder) F41.1     Past Psychiatric History: Have reviewed past psychiatric history from my progress note on 01/21/2018.  Past trials of Seroquel, Cymbalta, Xanax.  Past Medical History:  Past Medical History:  Diagnosis Date  . Anginal pain ([place])    none in approx 2 yrs  . Anxiety   . Asthma   . Chronic lower back pain   . Common migraine  with intractable migraine 09/20/2016  . Depression    suicidal ideation  . Diabetes mellitus without complication ([place])   . GERD (gastroesophageal reflux disease)   . Headache    migraines -3x/wk  . Hyperlipidemia   . Hypertension   . Hypothyroidism   . Motion sickness    cars  . Multilevel degenerative disc disease   . Shortness of breath dyspnea   . Sleep apnea    CPAP  . Vertigo   . Wears dentures    partial upper    Past Surgical History:  Procedure Laterality Date  . ABDOMINAL HYSTERECTOMY    . bladder tach  1995  . CESAREAN SECTION    . COLONOSCOPY WITH PROPOFOL N/A 05/29/2015   Procedure: COLONOSCOPY WITH PROPOFOL;  Surgeon: Lucilla Lame, MD;  Location: [facility];  Service: Endoscopy;  Laterality: N/A;  Latex allergy sleep apnea - no CPAP machine (yet)  . ESOPHAGOGASTRODUODENOSCOPY N/A 04/23/2017   Procedure: ESOPHAGOGASTRODUODENOSCOPY (EGD);  Surgeon: Lin Landsman, MD;  Location: [facility];  Service: Gastroenterology;  Laterality: N/A;  Latex allergy sleep apnea  . POLYPECTOMY  05/29/2015   Procedure: POLYPECTOMY;  Surgeon: Lucilla Lame, MD;  Location: [facility];  Service: Endoscopy;;    Family Psychiatric History: Reviewed family psychiatric history from my progress note on 01/21/2018.  Family History:  Family History  Problem Relation Age of Onset  . Diabetes Sister   . Anxiety disorder Sister   . Depression Sister   .  Hypertension Sister   . Cirrhosis Mother   . Diabetes Mother   . Cirrhosis Father   . Breast cancer Sister 31  . Heart attack Brother   . Alcohol abuse Brother   . Drug abuse Brother   . Prostate cancer Neg Hx   . Kidney cancer Neg Hx   . Bladder Cancer Neg Hx     Social History: Reviewed social history from my progress note on 01/21/2018 Social History   Socioeconomic History  . Marital status: Married    Spouse name: clayton sr  . Number of children: 3  . Years of education: Not on file  .  Highest education level: GED or equivalent  Occupational History  . Not on file  Social Needs  . Financial resource strain: Very hard  . Food insecurity:    Worry: Often true    Inability: Often true  . Transportation needs:    Medical: No    Non-medical: No  Tobacco Use  . Smoking status: Never Smoker  . Smokeless tobacco: Never Used  Substance and Sexual Activity  . Alcohol use: No    Alcohol/week: 0.0 standard drinks  . Drug use: No  . Sexual activity: Yes    Partners: Male  Lifestyle  . Physical activity:    Days per week: 2 days    Minutes per session: 30 min  . Stress: Very much  Relationships  . Social connections:    Talks on phone: Three times a week    Gets together: Once a week    Attends religious service: More than 4 times per year    Active member of club or organization: No    Attends meetings of clubs or organizations: Never    Relationship status: Married  Other Topics Concern  . Not on file  Social History Narrative   Lives with husband and one daughter other kids are out of the house   She does not work, husband on disability     Allergies:  Allergies  Allergen Reactions  . Latex     Pt unsure of reaction pt states something happened while in the hospital after surgery.    Metabolic Disorder Labs: Lab Results  Component Value Date   HGBA1C 6.6 (A) 03/19/2018   MPG 143 08/18/2017   MPG 128 02/12/2017   No results found for: PROLACTIN Lab Results  Component Value Date   CHOL 159 03/23/2018   TRIG 211 (H) 03/23/2018   HDL 43 (L) 03/23/2018   CHOLHDL 3.7 03/23/2018   VLDL 44 (H) 02/12/2017   LDLCALC 85 03/23/2018   LDLCALC 99 08/18/2017   Lab Results  Component Value Date   TSH 4.53 (H) 03/23/2018   TSH 3.46 08/18/2017    Therapeutic Level Labs: No results found for: LITHIUM No results found for: VALPROATE No components found for:  CBMZ  Current Medications: Current Outpatient Medications  Medication Sig Dispense Refill  .  ALPRAZolam (XANAX XR) 0.5 MG 24 hr tablet Take 1 tablet (0.5 mg total) by mouth daily. 30 tablet 2  . aspirin 81 MG EC tablet Take 81 mg by mouth daily.      Marland Kitchen atorvastatin (LIPITOR) 40 MG tablet TAKE 1 TABLET BY MOUTH AT BEDTIME. GENERIC EQUIVALENT FOR LIPITOR 90 tablet 1  . cloNIDine (CATAPRES) 0.1 MG tablet TAKE 1 TABLET BY MOUTH 2 TIMES DAILY. START AT NIGHT FOR HOT FLASHES AND TRY TAKING TWICE DAILY IF TOLERATED 180 tablet 0  . conjugated estrogens (PREMARIN) vaginal cream  Place 1 Applicatorful vaginally daily. Apply 0.5mg  (pea-sized amount)  just inside the vaginal introitus with a finger-tip every night for two weeks and then Monday, Wednesday and Friday nights. 30 g 12  . cyclobenzaprine (FLEXERIL) 5 MG tablet Take 5 mg by mouth 3 (three) times daily as needed.   0  . diclofenac (CATAFLAM) 50 MG tablet TAKE 1 TABLET BY MOUTH THREE TIMES DAILY AS NEEDED 60 tablet 2  . DULoxetine (CYMBALTA) 30 MG capsule Take 1 capsule (30 mg total) by mouth 2 (two) times daily. 180 capsule 1  . EQ ALLERGY RELIEF 10 MG tablet TAKE ONE TABLET BY MOUTH ONCE DAILY 90 tablet 1  . fluticasone (FLONASE) 50 MCG/ACT nasal spray Place 2 sprays into both nostrils daily.     Marland Kitchen gabapentin (NEURONTIN) 300 MG capsule One in the morning and 2 in the evening. 90 capsule 3  . hydrocortisone (ANUSOL-HC) 2.5 % rectal cream Place rectally 2 (two) times daily. (Patient taking differently: Place 1 application rectally 2 (two) times daily as needed. ) 30 g 0  . hydrOXYzine (ATARAX/VISTARIL) 25 MG tablet Take 1 tablet (25 mg total) by mouth 3 (three) times daily as needed (for severe anxiety only). 270 tablet 1  . levothyroxine (SYNTHROID, LEVOTHROID) 88 MCG tablet Take 1 tablet (88 mcg total) by mouth daily before breakfast. Take one and a half on Sundays Sandoz brand please 100 tablet 0  . linaclotide (LINZESS) 145 MCG CAPS capsule TAKE 1 CAPSULE BY MOUTH ONCE DAILY BEFORE BREAKFAST 90 capsule 1  . metoprolol succinate (TOPROL-XL)  50 MG 24 hr tablet TAKE 1 TABLET BY MOUTH DAILY 90 tablet 1  . mometasone (ASMANEX 120 METERED DOSES) 220 MCG/INH inhaler Inhale 2 puffs into the lungs daily. 1 Inhaler 12  . montelukast (SINGULAIR) 10 MG tablet Take 1 tablet (10 mg total) by mouth at bedtime. 90 tablet 1  . nitroGLYCERIN (NITROSTAT) 0.4 MG SL tablet Place 1 tablet (0.4 mg total) under the tongue every 5 (five) minutes as needed. 25 tablet 0  . olmesartan-hydrochlorothiazide (BENICAR HCT) 40-25 MG tablet Take 1 tablet by mouth daily. 90 tablet 1  . omeprazole (PRILOSEC) 40 MG capsule TAKE 1 CAPSULE(40MG  TOTAL) BY MOUTH DAILY 90 capsule 0  . rizatriptan (MAXALT) 10 MG tablet Take 1 tablet (10 mg total) by mouth 3 (three) times daily as needed for migraine. 10 tablet 5  . Trospium Chloride 60 MG CP24 Take 1 capsule (60 mg total) by mouth daily. 90 each 3  . DULoxetine (CYMBALTA) 20 MG capsule Take 1 capsule (20 mg total) by mouth daily. To be combined with 60 mg to make it 80 mg daily 90 capsule 1  . QUEtiapine (SEROQUEL) 50 MG tablet Take 1-1.5 tablets (50-75 mg total) by mouth at bedtime. 135 tablet 0   No current facility-administered medications for this visit.      Musculoskeletal: Strength & Muscle Tone: within normal limits Gait & Station: normal Patient leans: N/A  Psychiatric Specialty Exam: Review of Systems  HENT:       Dry mouth  Psychiatric/Behavioral: The patient is nervous/anxious.   All other systems reviewed and are negative.   Blood pressure (!) 145/87, pulse 84, temperature 98.1 F (36.7 C), temperature source Oral, weight 268 lb 3.2 oz (121.7 kg).Body mass index is 44.63 kg/m.  General Appearance: Casual  Eye Contact:  Fair  Speech:  Clear and Coherent  Volume:  Normal  Mood:  Anxious  Affect:  Congruent  Thought Process:  Goal Directed  and Descriptions of Associations: Intact  Orientation:  Full (Time, Place, and Person)  Thought Content: Logical   Suicidal Thoughts:  No  Homicidal Thoughts:   No  Memory:  Immediate;   Fair Recent;   Fair Remote;   Fair  Judgement:  Fair  Insight:  Fair  Psychomotor Activity:  Normal  Concentration:  Concentration: Fair and Attention Span: Fair  Recall:  [employer] of Knowledge: Fair  Language: Fair  Akathisia:  No  Handed:  Right  AIMS (if indicated): denies tremors, rigidity,stiffness  Assets:  Communication Skills Desire for Improvement Social Support  ADL's:  Intact  Cognition: WNL  Sleep:  Fair   Screenings: GAD-7     Office Visit from 03/19/2018 in [facility] Office Visit from 03/04/2016 in [facility] Office Visit from 12/20/2015 in [facility]  Total GAD-7 Score  14  18  18     PHQ2-9     Nutrition from 04/14/2018 in [place] Office Visit from 03/19/2018 in [facility] Office Visit from 12[facility]17/2017 in [facility] Office Visit from 08/18/2017 in [facility] Office Visit from 02/12/2017 in [facility]  PHQ-2 Total Score  4  4  1  5   0  PHQ-9 Total Score  14  14  10  21   -       Assessment and Plan: Beverly Barrett is a 58 yr old African-American female who is married, unemployed, lives in [place], has a history of depression, anxiety, insomnia, OSA on CPAP, hypothyroidism, arthritis, presented to the clinic today for a follow-up visit.  Patient continues to have anxiety symptoms.  Will continue to readjust medications.  She will continue psychotherapy sessions.  Plan MDD Increase Cymbalta to 80 mg p.o. daily in divided dosage. Reduce Seroquel to 50 - 75 mg p.o. nightly.  For GAD Cymbalta 80 mg p.o. daily in divided dosage She will continue to cut down on Xanax. She has been making use of hydroxyzine 25 mg p.o. 3 times daily PRN.  Discussed with patient to limit use.  Patient will continue psychotherapy sessions with Ms. Peacock.  Follow-up in clinic in 1 month or  sooner if needed.  More than 50 % of the time was spent for psychoeducation and supportive psychotherapy and care coordination.  This note was generated in part or whole with voice recognition software. Voice recognition is usually quite accurate but there are transcription errors that can and very often do occur. I apologize for any typographical errors that were not detected and corrected.        Ursula Alert, MD 07/15/2018, 12:31 PM
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
## 2018-07-24 ENCOUNTER — Other Ambulatory Visit: Payer: Self-pay | Admitting: Family Medicine

## 2018-07-24 DIAGNOSIS — E034 Atrophy of thyroid (acquired): Secondary | ICD-10-CM

## 2018-07-24 NOTE — Telephone Encounter (Signed)
Copied from [place] #[id number]. Topic: Quick Communication - Rx Refill/Question >> Jul 24, 2018  2:36 PM Oneta Rack wrote:  Medication: levothyroxine (SYNTHROID, LEVOTHROID) 88 MCG tablet   Has the patient contacted their pharmacy? Yes   (Agent: If yes, when and what did the pharmacy advise?) advised to call PCP office    Preferred Pharmacy (with phone number or street name): [facility] ([facility] [place] [phone] (Phone) [phone] (Fax)    Agent: Please be advised that RX refills may take up to 3 business days. We ask that you follow-up with your pharmacy.

## 2018-07-27 NOTE — Telephone Encounter (Signed)
Refill request for thyroid medication: Levothyroxine 88  Mcg  Last Physical: 04/14/2015  Lab Results  Component Value Date   TSH 4.53 (H) 03/23/2018    Follow-ups on file. 08/03/2018

## 2018-07-28 NOTE — Telephone Encounter (Signed)
She needs repeat TSH, give one week if out of medication

## 2018-07-28 NOTE — Telephone Encounter (Signed)
Called patient. Left vm for her to come in and have TSH drawn and told her to call back if she was out of medication. Will send 1 week of medication until labs are drawn and results are back.

## 2018-07-29 MED ORDER — LEVOTHYROXINE SODIUM 88 MCG PO TABS: 88.0000 ug | ORAL_TABLET | Freq: Every day | ORAL | 0 refills | Status: DC

## 2018-07-30 ENCOUNTER — Other Ambulatory Visit: Payer: Self-pay

## 2018-07-30 MED ORDER — GABAPENTIN 300 MG PO CAPS: ORAL_CAPSULE | ORAL | 3 refills | Status: DC

## 2018-08-03 ENCOUNTER — Other Ambulatory Visit: Payer: Self-pay | Admitting: Family Medicine

## 2018-08-03 ENCOUNTER — Encounter: Payer: Self-pay | Admitting: Family Medicine

## 2018-08-03 ENCOUNTER — Ambulatory Visit (INDEPENDENT_AMBULATORY_CARE_PROVIDER_SITE_OTHER): Payer: BLUE CROSS/BLUE SHIELD | Admitting: Family Medicine

## 2018-08-03 VITALS — BP 126/86 | HR 80 | Temp 97.9°F | Resp 16 | Ht 65.0 in | Wt 266.7 lb

## 2018-08-03 DIAGNOSIS — Z9989 Dependence on other enabling machines and devices: Secondary | ICD-10-CM

## 2018-08-03 DIAGNOSIS — R232 Flushing: Secondary | ICD-10-CM

## 2018-08-03 DIAGNOSIS — Z1239 Encounter for other screening for malignant neoplasm of breast: Secondary | ICD-10-CM

## 2018-08-03 DIAGNOSIS — E034 Atrophy of thyroid (acquired): Secondary | ICD-10-CM

## 2018-08-03 DIAGNOSIS — E785 Hyperlipidemia, unspecified: Secondary | ICD-10-CM | POA: Diagnosis not present

## 2018-08-03 DIAGNOSIS — D508 Other iron deficiency anemias: Secondary | ICD-10-CM

## 2018-08-03 DIAGNOSIS — Z6841 Body Mass Index (BMI) 40.0 and over, adult: Secondary | ICD-10-CM

## 2018-08-03 DIAGNOSIS — I10 Essential (primary) hypertension: Secondary | ICD-10-CM

## 2018-08-03 DIAGNOSIS — G4733 Obstructive sleep apnea (adult) (pediatric): Secondary | ICD-10-CM

## 2018-08-03 DIAGNOSIS — F331 Major depressive disorder, recurrent, moderate: Secondary | ICD-10-CM | POA: Diagnosis not present

## 2018-08-03 DIAGNOSIS — K219 Gastro-esophageal reflux disease without esophagitis: Secondary | ICD-10-CM

## 2018-08-03 DIAGNOSIS — K5909 Other constipation: Secondary | ICD-10-CM

## 2018-08-03 DIAGNOSIS — E1169 Type 2 diabetes mellitus with other specified complication: Secondary | ICD-10-CM | POA: Diagnosis not present

## 2018-08-03 DIAGNOSIS — J4520 Mild intermittent asthma, uncomplicated: Secondary | ICD-10-CM

## 2018-08-03 DIAGNOSIS — Z1231 Encounter for screening mammogram for malignant neoplasm of breast: Secondary | ICD-10-CM

## 2018-08-03 LAB — POCT GLYCOSYLATED HEMOGLOBIN (HGB A1C)
HbA1c POC (<> result, manual entry): 6.3 % (ref 4.0–5.6)
HbA1c, POC (controlled diabetic range): 6.3 % (ref 0.0–7.0)
HbA1c, POC (prediabetic range): 6.3 % (ref 5.7–6.4)
Hemoglobin A1C: 6.3 % — AB (ref 4.0–5.6)

## 2018-08-03 MED ORDER — ATORVASTATIN CALCIUM 40 MG PO TABS: ORAL_TABLET | ORAL | 1 refills | Status: DC

## 2018-08-03 MED ORDER — MOMETASONE FUROATE 220 MCG/INH IN AEPB: 2.0000 | INHALATION_SPRAY | Freq: Every day | RESPIRATORY_TRACT | 1 refills | Status: DC

## 2018-08-03 MED ORDER — OLMESARTAN MEDOXOMIL-HCTZ 40-25 MG PO TABS: 1.0000 | ORAL_TABLET | Freq: Every day | ORAL | 1 refills | Status: DC

## 2018-08-03 MED ORDER — MONTELUKAST SODIUM 10 MG PO TABS: 10.0000 mg | ORAL_TABLET | Freq: Every day | ORAL | 1 refills | Status: DC

## 2018-08-03 MED ORDER — CLONIDINE HCL 0.1 MG PO TABS: 0.1000 mg | ORAL_TABLET | Freq: Two times a day (BID) | ORAL | 1 refills | Status: DC

## 2018-08-03 MED ORDER — OMEPRAZOLE 40 MG PO CPDR: 40.0000 mg | DELAYED_RELEASE_CAPSULE | Freq: Every day | ORAL | 1 refills | Status: DC

## 2018-08-03 MED ORDER — METOPROLOL SUCCINATE ER 50 MG PO TB24: ORAL_TABLET | ORAL | 1 refills | Status: DC

## 2018-08-03 MED ORDER — LINACLOTIDE 145 MCG PO CAPS: ORAL_CAPSULE | ORAL | 1 refills | Status: DC

## 2018-08-03 MED ORDER — ALBUTEROL SULFATE HFA 108 (90 BASE) MCG/ACT IN AERS: 2.0000 | INHALATION_SPRAY | Freq: Four times a day (QID) | RESPIRATORY_TRACT | 0 refills | Status: DC | PRN

## 2018-08-03 MED ORDER — GLUCOSE BLOOD VI STRP: ORAL_STRIP | 12 refills | Status: DC

## 2018-08-03 NOTE — Progress Notes (Signed)
Name: Beverly Barrett   MRN: [id number]    DOB: [birth year]   Date:08/03/2018       Progress Note  Subjective  Chief Complaint  Chief Complaint  Patient presents with  . Diabetes  . Hyperlipidemia    HPI  Diabetes type 2 with dyslipidemia in obese: she has a long history of metabolic syndrome. However hgbA1C gradually went up and back in January 2019 her A1C went to 6.6%, she has gone to DM teaching class, more active, eating better and hgbA1C today is 6.2% . She denies polyphagia but has polyuria and  Polydipsia. She never took medications for DM, she is on Atorvastatin. Continue life style modification.  Major Depression/GAD:She is now under the care of Dr. Shea Evans, weaning self off Alprazolam, taking hdyroxizine prn anxiety and seems to be doing better. She is sleeping better, feeling less tired and more happy.   HTN: she taking her medication BenicarHCTZ and Toprol XL. She states dizziness is very seldom now and resolves with dramamin, bp was high when she arrived but improved before she left   Angina: had evaluation by cardiologist and Echo myoview in July normal result. She states chest pain still happens and at times during rest. She is on statin, aspirin, beta-blocker , ARB and has been taking NTG intermittently, but did not have to take any NTG since last visit. Pain is described as throbbing sensation on left side of chest and sometimes radiates to her left shoulder and back. Doing well at this time, no problems recently   Constipation: she was started on Linzess July 2017 and states has bowel movements daily , no straining or blood in stools, hemoccult negative April 2018. No side effects of medications, no longer having bleeding from hemorrhoids.   OSA: using CPAP every night, all night, and we started her on Seroquel She is sleeping 6-7 hours, feeling rested most mornings. Unchanged   Morbid obesity:shelost  A couple more pounds  since last visit, going for walks  with her daughter 2-3 times a week. Feeling better   GERD:Seen by Dr. Marius Ditch and has gastritis, advised to stop goody powders but is taking nsaid's given by Dr. Phyllis Ginger Waking up with nausea, but improves once she gest up and takes Omeprazole and Dramamine. Discussed importance of stopping NSAID's   Migraine: seen at the headache center by Dr. Jannifer Franklin, started on Topamax and Maxalt recently, still having headaches at this time, sheis off Topamax, and on gabapentin, but still having symptoms. She states less frequent now at most twice per week now.   Urinary urgency and incontinence: symptoms of low back and supra pubic pain again, urgency, nocturia, and sometimes she cannot make it to the bathroom. Still seeing Urologist. Unchanged   Hypothyroidism she is currently taking medication daily, she has chronic dry skin and constipation ( resolved with Linzes), lastTSHwasslightly elevated on Synthroid 88 mcg, we will recheck it today   DDD lumbar spine: had MRI done, ordered by Ortho and was found to have DDD lumbar spine, seeing Dr. Sharlet Salina. Stable , taking nsaids and flexeril prn   Asthma mild: long history,she has intermittent symptoms, occasionally has a dry cough and occasional wheezing, she does not have a rescue inhaler at home and we will give her a rx today   Patient Active Problem List   Diagnosis Date Noted  . Grade II internal hemorrhoids 05/16/2017  . Mixed stress and urge urinary incontinence 11/12/2016  . Degenerative arthritis of lumbar spine 11/12/2016  . Common migraine  with intractable migraine 09/20/2016  . Iron deficiency anemia 09/12/2016  . GAD (generalized anxiety disorder) 12/20/2015  . OSA on CPAP 12/20/2015  . Angina effort 12/20/2015  . Hyperglycemia 09/22/2015  . History of colonic polyps   . Benign neoplasm of sigmoid colon   . Frequency 04/14/2015  . Backache 04/14/2015  . Chronic pain of multiple joints 06/14/2014  . Dysmetabolic syndrome  37/62/8315  . Acid reflux 06/14/2014  . Extreme obesity 06/14/2014  . Arthritis, degenerative 06/14/2014  . Morbid obesity with BMI of 40.0-44.9, adult ([place]) 06/14/2014  . Hypothyroidism due to acquired atrophy of thyroid 08/18/2013  . Hypothyroidism, unspecified 08/18/2013  . Hypertension goal BP (blood pressure) < 140/90 12/07/2010  . Familial multiple lipoprotein-type hyperlipidemia 12/07/2010  . CN (constipation) 11/21/2009  . Allergic rhinitis 10/13/2008  . Asthma, mild intermittent, well-controlled 09/01/2008  . Major depressive disorder, recurrent episode, in partial remission with anxious distress ([place]) 08/13/2007    Past Surgical History:  Procedure Laterality Date  . ABDOMINAL HYSTERECTOMY    . bladder tach  1995  . CESAREAN SECTION    . COLONOSCOPY WITH PROPOFOL N/A 05/29/2015   Procedure: COLONOSCOPY WITH PROPOFOL;  Surgeon: Lucilla Lame, MD;  Location: [facility];  Service: Endoscopy;  Laterality: N/A;  Latex allergy sleep apnea - no CPAP machine (yet)  . ESOPHAGOGASTRODUODENOSCOPY N/A 04/23/2017   Procedure: ESOPHAGOGASTRODUODENOSCOPY (EGD);  Surgeon: Lin Landsman, MD;  Location: [facility];  Service: Gastroenterology;  Laterality: N/A;  Latex allergy sleep apnea  . POLYPECTOMY  05/29/2015   Procedure: POLYPECTOMY;  Surgeon: Lucilla Lame, MD;  Location: [facility];  Service: Endoscopy;;    Family History  Problem Relation Age of Onset  . Diabetes Sister   . Anxiety disorder Sister   . Depression Sister   . Hypertension Sister   . Cirrhosis Mother   . Diabetes Mother   . Cirrhosis Father   . Breast cancer Sister 83  . Heart attack Brother   . Alcohol abuse Brother   . Drug abuse Brother   . Prostate cancer Neg Hx   . Kidney cancer Neg Hx   . Bladder Cancer Neg Hx     Social History   Socioeconomic History  . Marital status: Married    Spouse name: clayton sr  . Number of children: 3  . Years of education: Not on file   . Highest education level: GED or equivalent  Occupational History  . Not on file  Social Needs  . Financial resource strain: Very hard  . Food insecurity:    Worry: Often true    Inability: Often true  . Transportation needs:    Medical: No    Non-medical: No  Tobacco Use  . Smoking status: Never Smoker  . Smokeless tobacco: Never Used  Substance and Sexual Activity  . Alcohol use: No    Alcohol/week: 0.0 standard drinks  . Drug use: No  . Sexual activity: Yes    Partners: Male  Lifestyle  . Physical activity:    Days per week: 2 days    Minutes per session: 30 min  . Stress: Very much  Relationships  . Social connections:    Talks on phone: Three times a week    Gets together: Once a week    Attends religious service: More than 4 times per year    Active member of club or organization: No    Attends meetings of clubs or organizations: Never    Relationship status: Married  .  Intimate partner violence:    Fear of current or ex partner: No    Emotionally abused: No    Physically abused: No    Forced sexual activity: No  Other Topics Concern  . Not on file  Social History Narrative   Lives with husband and one daughter other kids are out of the house   She does not work, husband on disability      Current Outpatient Medications:  .  aspirin 81 MG EC tablet, Take 81 mg by mouth daily.  , Disp: , Rfl:  .  atorvastatin (LIPITOR) 40 MG tablet, TAKE 1 TABLET BY MOUTH AT BEDTIME. GENERIC EQUIVALENT FOR LIPITOR, Disp: 90 tablet, Rfl: 1 .  cloNIDine (CATAPRES) 0.1 MG tablet, Take 1 tablet (0.1 mg total) by mouth 2 (two) times daily., Disp: 180 tablet, Rfl: 1 .  conjugated estrogens (PREMARIN) vaginal cream, Place 1 Applicatorful vaginally daily. Apply 0.5mg  (pea-sized amount)  just inside the vaginal introitus with a finger-tip every night for two weeks and then Monday, Wednesday and Friday nights., Disp: 30 g, Rfl: 12 .  cyclobenzaprine (FLEXERIL) 5 MG tablet, Take 5 mg  by mouth 3 (three) times daily as needed. , Disp: , Rfl: 0 .  diclofenac (CATAFLAM) 50 MG tablet, TAKE 1 TABLET BY MOUTH THREE TIMES DAILY AS NEEDED, Disp: 60 tablet, Rfl: 2 .  DULoxetine (CYMBALTA) 20 MG capsule, Take 1 capsule (20 mg total) by mouth daily. To be combined with 60 mg to make it 80 mg daily, Disp: 90 capsule, Rfl: 1 .  DULoxetine (CYMBALTA) 30 MG capsule, Take 1 capsule (30 mg total) by mouth 2 (two) times daily., Disp: 180 capsule, Rfl: 1 .  EQ ALLERGY RELIEF 10 MG tablet, TAKE ONE TABLET BY MOUTH ONCE DAILY, Disp: 90 tablet, Rfl: 1 .  fluticasone (FLONASE) 50 MCG/ACT nasal spray, Place 2 sprays into both nostrils daily. , Disp: , Rfl:  .  gabapentin (NEURONTIN) 300 MG capsule, One in the morning and 2 in the evening., Disp: 90 capsule, Rfl: 3 .  hydrOXYzine (ATARAX/VISTARIL) 25 MG tablet, Take 1 tablet (25 mg total) by mouth 3 (three) times daily as needed (for severe anxiety only)., Disp: 270 tablet, Rfl: 1 .  levothyroxine (SYNTHROID, LEVOTHROID) 88 MCG tablet, Take 1 tablet (88 mcg total) by mouth daily before breakfast. Take one and a half on Sundays Sandoz brand please, Disp: 7 tablet, Rfl: 0 .  linaclotide (LINZESS) 145 MCG CAPS capsule, TAKE 1 CAPSULE BY MOUTH ONCE DAILY BEFORE BREAKFAST, Disp: 90 capsule, Rfl: 1 .  metoprolol succinate (TOPROL-XL) 50 MG 24 hr tablet, TAKE 1 TABLET BY MOUTH DAILY, Disp: 90 tablet, Rfl: 1 .  mometasone (ASMANEX, 120 METERED DOSES,) 220 MCG/INH inhaler, Inhale 2 puffs into the lungs daily., Disp: 3 Inhaler, Rfl: 1 .  montelukast (SINGULAIR) 10 MG tablet, Take 1 tablet (10 mg total) by mouth at bedtime., Disp: 90 tablet, Rfl: 1 .  nitroGLYCERIN (NITROSTAT) 0.4 MG SL tablet, Place 1 tablet (0.4 mg total) under the tongue every 5 (five) minutes as needed., Disp: 25 tablet, Rfl: 0 .  olmesartan-hydrochlorothiazide (BENICAR HCT) 40-25 MG tablet, Take 1 tablet by mouth daily., Disp: 90 tablet, Rfl: 1 .  omeprazole (PRILOSEC) 40 MG capsule, Take 1  capsule (40 mg total) by mouth daily., Disp: 90 capsule, Rfl: 1 .  QUEtiapine (SEROQUEL) 50 MG tablet, Take 1-1.5 tablets (50-75 mg total) by mouth at bedtime., Disp: 135 tablet, Rfl: 0 .  rizatriptan (MAXALT) 10 MG tablet, Take  1 tablet (10 mg total) by mouth 3 (three) times daily as needed for migraine., Disp: 10 tablet, Rfl: 5 .  Trospium Chloride 60 MG CP24, Take 1 capsule (60 mg total) by mouth daily., Disp: 90 each, Rfl: 3 .  albuterol (VENTOLIN HFA) 108 (90 Base) MCG/ACT inhaler, Inhale 2 puffs into the lungs every 6 (six) hours as needed for wheezing or shortness of breath., Disp: 3 Inhaler, Rfl: 0  Allergies  Allergen Reactions  . Latex     Pt unsure of reaction pt states something happened while in the hospital after surgery.    I personally reviewed active problem list, medication list, allergies, family history, social history with the patient/caregiver today.   ROS  Ten systems reviewed and is negative except as mentioned in HPI   Objective  Vitals:   08/03/18 1048 08/03/18 1049  BP: (!) 140/100 126/86  Pulse: 80   Resp: 16   Temp: 97.9 F (36.6 C)   TempSrc: Oral   SpO2: 95%   Weight: 266 lb 11.2 oz (121 kg)   Height: 5\' 5"  (1.651 m)     Body mass index is 44.38 kg/m.  Physical Exam  Constitutional: Patient appears well-developed and well-nourished. Obese  No distress.  HEENT: head atraumatic, normocephalic, pupils equal and reactive to light,neck supple, throat within normal limits Cardiovascular: Normal rate, regular rhythm and normal heart sounds.  No murmur heard. No BLE edema. Pulmonary/Chest: Effort normal and breath sounds normal. No respiratory distress. Abdominal: Soft.  There is no tenderness. Psychiatric: Patient has a normal mood and affect. behavior is normal. Judgment and thought content normal.  Recent Results (from the past 2160 hour(s))  POCT HgB A1C     Status: Abnormal   Collection Time: 08/03/18 10:52 AM  Result Value Ref Range    Hemoglobin A1C 6.3 (A) 4.0 - 5.6 %   HbA1c POC (<> result, manual entry) 6.3 4.0 - 5.6 %   HbA1c, POC (prediabetic range) 6.3 5.7 - 6.4 %   HbA1c, POC (controlled diabetic range) 6.3 0.0 - 7.0 %     PHQ2/9: Depression screen [facility] 2/9 08/03/2018 04/14/2018 03/19/2018 12/17/2017 08/18/2017  Decreased Interest 2 2 2  0 2  Down, Depressed, Hopeless 2 2 2 1 3   PHQ - 2 Score 4 4 4 1 5   Altered sleeping 1 1 1 1 3   Tired, decreased energy 2 2 2  0 3  Change in appetite 2 2 2 1 2   Feeling bad or failure about yourself  1 2 2 1 2   Trouble concentrating 0 0 2 2 2   Moving slowly or fidgety/restless 1 3 1 2 2   Suicidal thoughts 0 0 0 2 2  PHQ-9 Score 11 14 14 10 21   Difficult doing work/chores Very difficult - Somewhat difficult Very difficult Very difficult  Some recent data might be hidden     Fall Risk: Fall Risk  08/03/2018 05/28/2018 05/21/2018 05/14/2018 04/14/2018  Falls in the past year? 0 No No No No  Number falls in past yr: 0 - - - -  Injury with Fall? 0 - - - -  Comment - - - - -  Follow up - - - - -      Assessment & Plan  1. Dyslipidemia associated with type 2 diabetes mellitus (HCC)  - POCT HgB A1C  2. Hypothyroidism due to acquired atrophy of thyroid  - TSH  3. Depression, major, recurrent, moderate ([place])  Keep follow up with Dr. Shea Evans  4. Hyperlipidemia with target LDL less than 100  - atorvastatin (LIPITOR) 40 MG tablet; TAKE 1 TABLET BY MOUTH AT BEDTIME. GENERIC EQUIVALENT FOR LIPITOR  Dispense: 90 tablet; Refill: 1  5. Morbid obesity with BMI of 40.0-44.9, adult [facility])  Discussed with the patient the risk posed by an increased BMI. Discussed importance of portion control, calorie counting and at least 150 minutes of physical activity weekly. Avoid sweet beverages and drink more water. Eat at least 6 servings of fruit and vegetables daily   6. OSA on CPAP   7. Other iron deficiency anemia  - CBC with Differential/Platelet - Iron, TIBC and Ferritin Panel;  Future  8. Hot flashes  - cloNIDine (CATAPRES) 0.1 MG tablet; Take 1 tablet (0.1 mg total) by mouth 2 (two) times daily.  Dispense: 180 tablet; Refill: 1  9. Hypertension goal BP (blood pressure) < 140/90  - metoprolol succinate (TOPROL-XL) 50 MG 24 hr tablet; TAKE 1 TABLET BY MOUTH DAILY  Dispense: 90 tablet; Refill: 1 - olmesartan-hydrochlorothiazide (BENICAR HCT) 40-25 MG tablet; Take 1 tablet by mouth daily.  Dispense: 90 tablet; Refill: 1  10. Chronic constipation  - linaclotide (LINZESS) 145 MCG CAPS capsule; TAKE 1 CAPSULE BY MOUTH ONCE DAILY BEFORE BREAKFAST  Dispense: 90 capsule; Refill: 1  11. Gastroesophageal reflux disease without esophagitis  - omeprazole (PRILOSEC) 40 MG capsule; Take 1 capsule (40 mg total) by mouth daily.  Dispense: 90 capsule; Refill: 1  12. Asthma, mild intermittent, well-controlled  - montelukast (SINGULAIR) 10 MG tablet; Take 1 tablet (10 mg total) by mouth at bedtime.  Dispense: 90 tablet; Refill: 1 - mometasone (ASMANEX, 120 METERED DOSES,) 220 MCG/INH inhaler; Inhale 2 puffs into the lungs daily.  Dispense: 3 Inhaler; Refill: 1 - albuterol (VENTOLIN HFA) 108 (90 Base) MCG/ACT inhaler; Inhale 2 puffs into the lungs every 6 (six) hours as needed for wheezing or shortness of breath.  Dispense: 3 Inhaler; Refill: 0

## 2018-08-04 ENCOUNTER — Other Ambulatory Visit: Payer: Self-pay | Admitting: Family Medicine

## 2018-08-04 DIAGNOSIS — E034 Atrophy of thyroid (acquired): Secondary | ICD-10-CM

## 2018-08-04 LAB — IRON,TIBC AND FERRITIN PANEL
%SAT: 22 % (calc) (ref 16–45)
Ferritin: 14 ng/mL — ABNORMAL LOW (ref 16–232)
Iron: 97 ug/dL (ref 45–160)
TIBC: 451 mcg/dL (calc) — ABNORMAL HIGH (ref 250–450)

## 2018-08-04 LAB — TEST AUTHORIZATION

## 2018-08-04 LAB — CBC WITH DIFFERENTIAL/PLATELET
Absolute Monocytes: 891 cells/uL (ref 200–950)
Basophils Absolute: 89 cells/uL (ref 0–200)
Basophils Relative: 0.9 %
EOS ABS: 366 {cells}/uL (ref 15–500)
Eosinophils Relative: 3.7 %
HEMATOCRIT: 33.1 % — AB (ref 35.0–45.0)
Hemoglobin: 11.1 g/dL — ABNORMAL LOW (ref 11.7–15.5)
Lymphs Abs: 2633 cells/uL (ref 850–3900)
MCH: 27 pg (ref 27.0–33.0)
MCHC: 33.5 g/dL (ref 32.0–36.0)
MCV: 80.5 fL (ref 80.0–100.0)
MPV: 10.8 fL (ref 7.5–12.5)
Monocytes Relative: 9 %
Neutro Abs: 5920 cells/uL (ref 1500–7800)
Neutrophils Relative %: 59.8 %
Platelets: 288 10*3/uL (ref 140–400)
RBC: 4.11 10*6/uL (ref 3.80–5.10)
RDW: 15 % (ref 11.0–15.0)
Total Lymphocyte: 26.6 %
WBC: 9.9 10*3/uL (ref 3.8–10.8)

## 2018-08-04 LAB — TSH: TSH: 7.45 m[IU]/L — AB (ref 0.40–4.50)

## 2018-08-04 MED ORDER — LEVOTHYROXINE SODIUM 100 MCG PO TABS: 100.0000 ug | ORAL_TABLET | Freq: Every day | ORAL | 0 refills | Status: DC

## 2018-08-05 ENCOUNTER — Other Ambulatory Visit: Payer: Self-pay | Admitting: Family Medicine

## 2018-08-05 ENCOUNTER — Telehealth: Payer: Self-pay

## 2018-08-05 MED ORDER — PLECANATIDE 3 MG PO TABS: 1.0000 | ORAL_TABLET | Freq: Every day | ORAL | 1 refills | Status: DC

## 2018-08-05 NOTE — Telephone Encounter (Signed)
We received a prior author for Linzess 145 mcg. Tiffany contacted the patient and patient states that she prefers Trulance instead. Please send Trulance rx to pharmacy.

## 2018-08-13 ENCOUNTER — Telehealth: Payer: Self-pay

## 2018-08-13 ENCOUNTER — Ambulatory Visit: Payer: BLUE CROSS/BLUE SHIELD | Admitting: Psychiatry

## 2018-08-13 ENCOUNTER — Encounter: Payer: Self-pay | Admitting: Psychiatry

## 2018-08-13 ENCOUNTER — Other Ambulatory Visit: Payer: Self-pay

## 2018-08-13 VITALS — BP 131/84 | HR 84 | Temp 98.9°F | Wt 267.0 lb

## 2018-08-13 DIAGNOSIS — F331 Major depressive disorder, recurrent, moderate: Secondary | ICD-10-CM

## 2018-08-13 DIAGNOSIS — F411 Generalized anxiety disorder: Secondary | ICD-10-CM | POA: Diagnosis not present

## 2018-08-13 NOTE — Progress Notes (Signed)
[facility] MD OP Progress Note  08/13/2018 12:13 PM Beverly Barrett  MRN:  [id number]  Chief Complaint:' I am here for follow up.'  Chief Complaint    Follow-up; Medication Refill     HPI: Beverly Barrett is a 58 year old African-American female, married, lives in [place], has a history of depression, anxiety, chronic pain, arthritis, OSA on CPAP, hypothyroidism, presented to the clinic today for a follow-up visit.  Patient reports her anxiety symptoms are making progress.  She does have some short period of time when she feels anxious however she is able to cope with it better.  She is tolerating the increased dosage of Cymbalta well.  She also has hydroxyzine as needed which helps.  Patient reports sleep is good.  Patient denies any suicidality, homicidality or perceptual disturbances.  Patient reports she had recent visit with her primary medical doctor and her TSH levels were abnormal.  I have reviewed medical records in [facility]-progress note per Dr. Ancil Boozer dated 08/03/2018- 'her levothyroxine has been increased to 100 mcg during that visit.'  Patient today denies any other concerns.  Patient will continue psychotherapy visits with Ms. Peacock. visit Diagnosis:    ICD-10-CM   1. MDD (major depressive disorder), recurrent episode, moderate (HCC) F33.1   2. GAD (generalized anxiety disorder) F41.1     Past Psychiatric History: Reviewed past psychiatric history from my progress note on 01/21/2018.  Past trials of Seroquel, Cymbalta, Xanax.  Past Medical History:  Past Medical History:  Diagnosis Date  . Anginal pain ([place])    none in approx 2 yrs  . Anxiety   . Asthma   . Chronic lower back pain   . Common migraine with intractable migraine 09/20/2016  . Depression    suicidal ideation  . Diabetes mellitus without complication ([place])   . GERD (gastroesophageal reflux disease)   . Headache    migraines -3x/wk  . Hyperlipidemia   . Hypertension   . Hypothyroidism   . Motion sickness    cars  . Multilevel degenerative disc disease   . Shortness of breath dyspnea   . Sleep apnea    CPAP  . Vertigo   . Wears dentures    partial upper    Past Surgical History:  Procedure Laterality Date  . ABDOMINAL HYSTERECTOMY    . bladder tach  1995  . CESAREAN SECTION    . COLONOSCOPY WITH PROPOFOL N/A 05/29/2015   Procedure: COLONOSCOPY WITH PROPOFOL;  Surgeon: Lucilla Lame, MD;  Location: [facility];  Service: Endoscopy;  Laterality: N/A;  Latex allergy sleep apnea - no CPAP machine (yet)  . ESOPHAGOGASTRODUODENOSCOPY N/A 04/23/2017   Procedure: ESOPHAGOGASTRODUODENOSCOPY (EGD);  Surgeon: Lin Landsman, MD;  Location: [facility];  Service: Gastroenterology;  Laterality: N/A;  Latex allergy sleep apnea  . POLYPECTOMY  05/29/2015   Procedure: POLYPECTOMY;  Surgeon: Lucilla Lame, MD;  Location: [facility];  Service: Endoscopy;;    Family Psychiatric History: Reviewed family psychiatric history from my progress note on 01/21/2018.  Family History:  Family History  Problem Relation Age of Onset  . Diabetes Sister   . Anxiety disorder Sister   . Depression Sister   . Hypertension Sister   . Cirrhosis Mother   . Diabetes Mother   . Cirrhosis Father   . Breast cancer Sister 58  . Heart attack Brother   . Alcohol abuse Brother   . Drug abuse Brother   . Prostate cancer Neg Hx   . Kidney cancer Neg Hx   .  Bladder Cancer Neg Hx     Social History: Reviewed social history from my progress note on 01/21/2018. Social History   Socioeconomic History  . Marital status: Married    Spouse name: Beverly Barrett  . Number of children: 3  . Years of education: Not on file  . Highest education level: GED or equivalent  Occupational History  . Not on file  Social Needs  . Financial resource strain: Very hard  . Food insecurity:    Worry: Often true    Inability: Often true  . Transportation needs:    Medical: No    Non-medical: No  Tobacco Use  .  Smoking status: Never Smoker  . Smokeless tobacco: Never Used  Substance and Sexual Activity  . Alcohol use: No    Alcohol/week: 0.0 standard drinks  . Drug use: No  . Sexual activity: Yes    Partners: Male  Lifestyle  . Physical activity:    Days per week: 2 days    Minutes per session: 30 min  . Stress: Very much  Relationships  . Social connections:    Talks on phone: Three times a week    Gets together: Once a week    Attends religious service: More than 4 times per year    Active member of club or organization: No    Attends meetings of clubs or organizations: Never    Relationship status: Married  Other Topics Concern  . Not on file  Social History Narrative   Lives with husband and one daughter other kids are out of the house   She does not work, husband on disability     Allergies:  Allergies  Allergen Reactions  . Latex     Pt unsure of reaction pt states something happened while in the hospital after surgery.    Metabolic Disorder Labs: Lab Results  Component Value Date   HGBA1C 6.3 (A) 08/03/2018   HGBA1C 6.3 08/03/2018   HGBA1C 6.3 08/03/2018   HGBA1C 6.3 08/03/2018   MPG 143 08/18/2017   MPG 128 02/12/2017   No results found for: PROLACTIN Lab Results  Component Value Date   CHOL 159 03/23/2018   TRIG 211 (H) 03/23/2018   HDL 43 (L) 03/23/2018   CHOLHDL 3.7 03/23/2018   VLDL 44 (H) 02/12/2017   LDLCALC 85 03/23/2018   LDLCALC 99 08/18/2017   Lab Results  Component Value Date   TSH 7.45 (H) 08/03/2018   TSH 4.53 (H) 03/23/2018    Therapeutic Level Labs: No results found for: LITHIUM No results found for: VALPROATE No components found for:  CBMZ  Current Medications: Current Outpatient Medications  Medication Sig Dispense Refill  . albuterol (VENTOLIN HFA) 108 (90 Base) MCG/ACT inhaler Inhale 2 puffs into the lungs every 6 (six) hours as needed for wheezing or shortness of breath. 3 Inhaler 0  . aspirin 81 MG EC tablet Take 81 mg by  mouth daily.      Marland Kitchen atorvastatin (LIPITOR) 40 MG tablet TAKE 1 TABLET BY MOUTH AT BEDTIME. GENERIC EQUIVALENT FOR LIPITOR 90 tablet 1  . cloNIDine (CATAPRES) 0.1 MG tablet Take 1 tablet (0.1 mg total) by mouth 2 (two) times daily. 180 tablet 1  . conjugated estrogens (PREMARIN) vaginal cream Place 1 Applicatorful vaginally daily. Apply 0.5mg  (pea-sized amount)  just inside the vaginal introitus with a finger-tip every night for two weeks and then Monday, Wednesday and Friday nights. 30 g 12  . cyclobenzaprine (FLEXERIL) 5 MG tablet Take 5  mg by mouth 3 (three) times daily as needed.   0  . diclofenac (CATAFLAM) 50 MG tablet TAKE 1 TABLET BY MOUTH THREE TIMES DAILY AS NEEDED 60 tablet 2  . DULoxetine (CYMBALTA) 20 MG capsule Take 1 capsule (20 mg total) by mouth daily. To be combined with 60 mg to make it 80 mg daily 90 capsule 1  . DULoxetine (CYMBALTA) 30 MG capsule Take 1 capsule (30 mg total) by mouth 2 (two) times daily. 180 capsule 1  . EQ ALLERGY RELIEF 10 MG tablet TAKE ONE TABLET BY MOUTH ONCE DAILY 90 tablet 1  . fluticasone (FLONASE) 50 MCG/ACT nasal spray Place 2 sprays into both nostrils daily.     Marland Kitchen gabapentin (NEURONTIN) 300 MG capsule One in the morning and 2 in the evening. 90 capsule 3  . glucose blood test strip Use once daily. 100 each 12  . hydrOXYzine (ATARAX/VISTARIL) 25 MG tablet Take 1 tablet (25 mg total) by mouth 3 (three) times daily as needed (for severe anxiety only). 270 tablet 1  . levothyroxine (SYNTHROID, LEVOTHROID) 100 MCG tablet Take 1 tablet (100 mcg total) by mouth daily. 90 tablet 0  . metoprolol succinate (TOPROL-XL) 50 MG 24 hr tablet TAKE 1 TABLET BY MOUTH DAILY 90 tablet 1  . mometasone (ASMANEX, 120 METERED DOSES,) 220 MCG/INH inhaler Inhale 2 puffs into the lungs daily. 3 Inhaler 1  . montelukast (SINGULAIR) 10 MG tablet Take 1 tablet (10 mg total) by mouth at bedtime. 90 tablet 1  . nitroGLYCERIN (NITROSTAT) 0.4 MG SL tablet Place 1 tablet (0.4 mg total)  under the tongue every 5 (five) minutes as needed. 25 tablet 0  . olmesartan-hydrochlorothiazide (BENICAR HCT) 40-25 MG tablet Take 1 tablet by mouth daily. 90 tablet 1  . omeprazole (PRILOSEC) 40 MG capsule Take 1 capsule (40 mg total) by mouth daily. 90 capsule 1  . Plecanatide (TRULANCE) 3 MG TABS Take 1 tablet by mouth daily. In place of linzess 90 tablet 1  . QUEtiapine (SEROQUEL) 50 MG tablet Take 1-1.5 tablets (50-75 mg total) by mouth at bedtime. 135 tablet 0  . rizatriptan (MAXALT) 10 MG tablet Take 1 tablet (10 mg total) by mouth 3 (three) times daily as needed for migraine. 10 tablet 5  . Trospium Chloride 60 MG CP24 Take 1 capsule (60 mg total) by mouth daily. 90 each 3   No current facility-administered medications for this visit.      Musculoskeletal: Strength & Muscle Tone: within normal limits Gait & Station: normal Patient leans: N/A  Psychiatric Specialty Exam: Review of Systems  Psychiatric/Behavioral: The patient is nervous/anxious.   All other systems reviewed and are negative.   Blood pressure 131/84, pulse 84, temperature 98.9 F (37.2 C), temperature source Oral, weight 267 lb (121.1 kg).Body mass index is 44.43 kg/m.  General Appearance: Casual  Eye Contact:  Fair  Speech:  Clear and Coherent  Volume:  Normal  Mood:  Anxious  Affect:  Congruent  Thought Process:  Goal Directed and Descriptions of Associations: Intact  Orientation:  Full (Time, Place, and Person)  Thought Content: Logical   Suicidal Thoughts:  No  Homicidal Thoughts:  No  Memory:  Immediate;   Fair Recent;   Fair Remote;   Fair  Judgement:  Good  Insight:  Fair  Psychomotor Activity:  Normal  Concentration:  Concentration: Fair and Attention Span: Fair  Recall:  [employer] of Knowledge: Fair  Language: Fair  Akathisia:  No  Handed:  Right  AIMS (if indicated): denies tremors, rigidity,stiffness  Assets:  Communication Skills Desire for Improvement Social Support  ADL's:   Intact  Cognition: WNL  Sleep:  Fair   Screenings: GAD-7     Office Visit from 03/19/2018 in [facility] Office Visit from 03/04/2016 in [facility] Office Visit from 12/20/2015 in [facility]  Total GAD-7 Score  14  18  18     PHQ2-9     Office Visit from 08/03/2018 in [facility] Nutrition from 04/14/2018 in [facility] Office Visit from 03/19/2018 in [facility] Office Visit from 12/17/2017 in [facility] Office Visit from 08/18/2017 in [facility]  PHQ-2 Total Score  4  4  4  1  5   PHQ-9 Total Score  11  14  14  10  21        Assessment and Plan:: Nitara is a 58 year old African-American female who is married, lives in [place], has a history of depression, anxiety, insomnia, OSA on CPAP, hypothyroidism, arthritis, presented to the clinic today for a follow-up visit.  Patient is currently making progress with regards to her anxiety symptoms.  Will continue plan as noted below.  Plan MDD- improving Cymbalta 80 mg p.o. daily in divided dosage. Seroquel at reduced dosage of 50 to 75 mg p.o. nightly.  For GAD-improving Cymbalta 80 mg p.o. daily in divided dosage She will continue to cut down on Xanax. She has been making use of hydroxyzine 25 mg p.o. 3 times daily PRN.  Discussed with patient to limit use.  I have reviewed labs dated 08/03/2018-7.45-TSH elevated.  Reviewed medical records in [facility]-progress note with Dr. Ancil Boozer dated 08/03/2018-patient's levothyroxine was increased to 100 mcg since she has elevated TSH.  Patient advised to return for follow-up labs and recheck.  Follow-up in clinic in 2 months or sooner if needed.  Discussed with patient to continue psychotherapy visits with Ms. Peacock.  I have spent atleast 15 minutes face to face with patient today. More than 50 % of the time was spent for psychoeducation and  supportive psychotherapy and care coordination.  This note was generated in part or whole with voice recognition software. Voice recognition is usually quite accurate but there are transcription errors that can and very often do occur. I apologize for any typographical errors that were not detected and corrected.          Ursula Alert, MD 08/13/2018, 12:13 PM

## 2018-08-13 NOTE — Telephone Encounter (Signed)
Erroneous Entry  

## 2018-08-19 ENCOUNTER — Ambulatory Visit: Payer: BLUE CROSS/BLUE SHIELD | Admitting: Licensed Clinical Social Worker

## 2018-08-19 DIAGNOSIS — F331 Major depressive disorder, recurrent, moderate: Secondary | ICD-10-CM

## 2018-08-19 DIAGNOSIS — F411 Generalized anxiety disorder: Secondary | ICD-10-CM

## 2018-08-24 ENCOUNTER — Ambulatory Visit
Admission: RE | Admit: 2018-08-24 | Discharge: 2018-08-24 | Disposition: A | Discharge status: Home / Self Care | Payer: BLUE CROSS/BLUE SHIELD | Source: Ambulatory Visit | Attending: Family Medicine | Admitting: Family Medicine

## 2018-08-24 DIAGNOSIS — Z1231 Encounter for screening mammogram for malignant neoplasm of breast: Secondary | ICD-10-CM | POA: Insufficient documentation

## 2018-09-02 ENCOUNTER — Emergency Department: Payer: BLUE CROSS/BLUE SHIELD

## 2018-09-02 ENCOUNTER — Inpatient Hospital Stay
Admission: EM | Admit: 2018-09-02 | Discharge: 2018-09-04 | DRG: 158 | Disposition: A | Discharge status: Home / Self Care | Payer: BLUE CROSS/BLUE SHIELD | Attending: Internal Medicine | Admitting: Internal Medicine

## 2018-09-02 ENCOUNTER — Other Ambulatory Visit: Payer: Self-pay

## 2018-09-02 DIAGNOSIS — Z90710 Acquired absence of both cervix and uterus: Secondary | ICD-10-CM | POA: Diagnosis not present

## 2018-09-02 DIAGNOSIS — K048 Radicular cyst: Secondary | ICD-10-CM | POA: Diagnosis present

## 2018-09-02 DIAGNOSIS — F329 Major depressive disorder, single episode, unspecified: Secondary | ICD-10-CM | POA: Diagnosis present

## 2018-09-02 DIAGNOSIS — Z803 Family history of malignant neoplasm of breast: Secondary | ICD-10-CM | POA: Diagnosis not present

## 2018-09-02 DIAGNOSIS — G4733 Obstructive sleep apnea (adult) (pediatric): Secondary | ICD-10-CM | POA: Diagnosis present

## 2018-09-02 DIAGNOSIS — F419 Anxiety disorder, unspecified: Secondary | ICD-10-CM | POA: Diagnosis present

## 2018-09-02 DIAGNOSIS — E785 Hyperlipidemia, unspecified: Secondary | ICD-10-CM | POA: Diagnosis present

## 2018-09-02 DIAGNOSIS — J45909 Unspecified asthma, uncomplicated: Secondary | ICD-10-CM | POA: Diagnosis present

## 2018-09-02 DIAGNOSIS — Z8249 Family history of ischemic heart disease and other diseases of the circulatory system: Secondary | ICD-10-CM | POA: Diagnosis not present

## 2018-09-02 DIAGNOSIS — K047 Periapical abscess without sinus: Secondary | ICD-10-CM | POA: Diagnosis not present

## 2018-09-02 DIAGNOSIS — Z79890 Hormone replacement therapy (postmenopausal): Secondary | ICD-10-CM

## 2018-09-02 DIAGNOSIS — L03211 Cellulitis of face: Secondary | ICD-10-CM

## 2018-09-02 DIAGNOSIS — Z7951 Long term (current) use of inhaled steroids: Secondary | ICD-10-CM

## 2018-09-02 DIAGNOSIS — I10 Essential (primary) hypertension: Secondary | ICD-10-CM | POA: Diagnosis present

## 2018-09-02 DIAGNOSIS — K090 Developmental odontogenic cysts: Secondary | ICD-10-CM | POA: Diagnosis present

## 2018-09-02 DIAGNOSIS — Z972 Presence of dental prosthetic device (complete) (partial): Secondary | ICD-10-CM

## 2018-09-02 DIAGNOSIS — Z833 Family history of diabetes mellitus: Secondary | ICD-10-CM | POA: Diagnosis not present

## 2018-09-02 DIAGNOSIS — Z7982 Long term (current) use of aspirin: Secondary | ICD-10-CM

## 2018-09-02 DIAGNOSIS — Z818 Family history of other mental and behavioral disorders: Secondary | ICD-10-CM

## 2018-09-02 DIAGNOSIS — K219 Gastro-esophageal reflux disease without esophagitis: Secondary | ICD-10-CM | POA: Diagnosis present

## 2018-09-02 DIAGNOSIS — E039 Hypothyroidism, unspecified: Secondary | ICD-10-CM | POA: Diagnosis present

## 2018-09-02 DIAGNOSIS — Z91040 Latex allergy status: Secondary | ICD-10-CM

## 2018-09-02 DIAGNOSIS — E119 Type 2 diabetes mellitus without complications: Secondary | ICD-10-CM | POA: Diagnosis present

## 2018-09-02 DIAGNOSIS — Z79899 Other long term (current) drug therapy: Secondary | ICD-10-CM

## 2018-09-02 LAB — BASIC METABOLIC PANEL
Anion gap: 10 (ref 5–15)
BUN: 13 mg/dL (ref 6–20)
CO2: 26 mmol/L (ref 22–32)
Calcium: 9 mg/dL (ref 8.9–10.3)
Chloride: 100 mmol/L (ref 98–111)
Creatinine, Ser: 0.8 mg/dL (ref 0.44–1.00)
GFR calc non Af Amer: >60 mL/min (ref >60)
Glucose, Bld: 162 mg/dL — ABNORMAL HIGH (ref 70–99)
Potassium: 3.2 mmol/L — ABNORMAL LOW (ref 3.5–5.1)
Sodium: 136 mmol/L (ref 135–145)

## 2018-09-02 LAB — CBC
HCT: 36.8 % (ref 36.0–46.0)
Hemoglobin: 12 g/dL (ref 12.0–15.0)
MCH: 26.8 pg (ref 26.0–34.0)
MCHC: 32.6 g/dL (ref 30.0–36.0)
MCV: 82.3 fL (ref 80.0–100.0)
Platelets: 300 10*3/uL (ref 150–400)
RBC: 4.47 MIL/uL (ref 3.87–5.11)
RDW: 15.8 % — ABNORMAL HIGH (ref 11.5–15.5)
WBC: 17 10*3/uL — AB (ref 4.0–10.5)
nRBC: 0 % (ref 0.0–0.2)

## 2018-09-02 LAB — GLUCOSE, CAPILLARY: Glucose-Capillary: 219 mg/dL — ABNORMAL HIGH (ref 70–99)

## 2018-09-02 MED ORDER — SODIUM CHLORIDE 0.9 % IV SOLN
3.0000 g | Freq: Once | INTRAVENOUS | Status: AC
  Administered 2018-09-02: 3 g via INTRAVENOUS
  Filled 2018-09-02: qty 3

## 2018-09-02 MED ORDER — DEXAMETHASONE SODIUM PHOSPHATE 10 MG/ML IJ SOLN
10.0000 mg | Freq: Once | INTRAMUSCULAR | Status: AC
  Administered 2018-09-02: 10 mg via INTRAVENOUS
  Filled 2018-09-02: qty 1

## 2018-09-02 MED ORDER — ACETAMINOPHEN 325 MG PO TABS
650.0000 mg | ORAL_TABLET | Freq: Four times a day (QID) | ORAL | Status: DC | PRN
  Administered 2018-09-03: 650 mg via ORAL
  Filled 2018-09-02: qty 2

## 2018-09-02 MED ORDER — CLONIDINE HCL 0.1 MG PO TABS
0.1000 mg | ORAL_TABLET | Freq: Two times a day (BID) | ORAL | Status: DC
  Administered 2018-09-03 – 2018-09-04 (×4): 0.1 mg via ORAL
  Filled 2018-09-02 (×5): qty 1

## 2018-09-02 MED ORDER — METOPROLOL SUCCINATE ER 50 MG PO TB24
50.0000 mg | ORAL_TABLET | Freq: Every day | ORAL | Status: DC
  Administered 2018-09-03 – 2018-09-04 (×2): 50 mg via ORAL
  Filled 2018-09-02 (×2): qty 1

## 2018-09-02 MED ORDER — INSULIN ASPART 100 UNIT/ML ~~LOC~~ SOLN
0.0000 [IU] | Freq: Three times a day (TID) | SUBCUTANEOUS | Status: DC
  Administered 2018-09-03 (×2): 2 [IU] via SUBCUTANEOUS
  Administered 2018-09-03: 1 [IU] via SUBCUTANEOUS
  Filled 2018-09-02 (×4): qty 1

## 2018-09-02 MED ORDER — MONTELUKAST SODIUM 10 MG PO TABS
10.0000 mg | ORAL_TABLET | Freq: Every day | ORAL | Status: DC
  Administered 2018-09-03 (×2): 10 mg via ORAL
  Filled 2018-09-02 (×2): qty 1

## 2018-09-02 MED ORDER — SUMATRIPTAN SUCCINATE 50 MG PO TABS
50.0000 mg | ORAL_TABLET | ORAL | Status: DC | PRN
  Administered 2018-09-03: 50 mg via ORAL
  Filled 2018-09-02 (×2): qty 1

## 2018-09-02 MED ORDER — IOPAMIDOL (ISOVUE-300) INJECTION 61%
75.0000 mL | Freq: Once | INTRAVENOUS | Status: AC | PRN
  Administered 2018-09-02: 75 mL via INTRAVENOUS

## 2018-09-02 MED ORDER — DULOXETINE HCL 30 MG PO CPEP
30.0000 mg | ORAL_CAPSULE | Freq: Two times a day (BID) | ORAL | Status: DC
  Administered 2018-09-03 (×2): 30 mg via ORAL
  Filled 2018-09-02 (×2): qty 1

## 2018-09-02 MED ORDER — PANTOPRAZOLE SODIUM 40 MG PO TBEC
40.0000 mg | DELAYED_RELEASE_TABLET | Freq: Every day | ORAL | Status: DC
  Administered 2018-09-03 – 2018-09-04 (×2): 40 mg via ORAL
  Filled 2018-09-02 (×2): qty 1

## 2018-09-02 MED ORDER — ALBUTEROL SULFATE (2.5 MG/3ML) 0.083% IN NEBU: 3.0000 mL | INHALATION_SOLUTION | Freq: Four times a day (QID) | RESPIRATORY_TRACT | Status: DC | PRN

## 2018-09-02 MED ORDER — QUETIAPINE FUMARATE 25 MG PO TABS
50.0000 mg | ORAL_TABLET | Freq: Every day | ORAL | Status: DC
  Administered 2018-09-03: 25 mg via ORAL
  Filled 2018-09-02: qty 3

## 2018-09-02 MED ORDER — BUDESONIDE 0.25 MG/2ML IN SUSP
0.2500 mg | Freq: Two times a day (BID) | RESPIRATORY_TRACT | Status: DC
  Administered 2018-09-03 – 2018-09-04 (×3): 0.25 mg via RESPIRATORY_TRACT
  Filled 2018-09-02 (×3): qty 2

## 2018-09-02 MED ORDER — MOMETASONE FUROATE 220 MCG/INH IN AEPB: 2.0000 | INHALATION_SPRAY | Freq: Every day | RESPIRATORY_TRACT | Status: DC

## 2018-09-02 MED ORDER — OXYCODONE-ACETAMINOPHEN 5-325 MG PO TABS
1.0000 | ORAL_TABLET | Freq: Four times a day (QID) | ORAL | Status: DC | PRN
  Administered 2018-09-02 – 2018-09-04 (×5): 1 via ORAL
  Filled 2018-09-02 (×5): qty 1

## 2018-09-02 MED ORDER — LEVOTHYROXINE SODIUM 100 MCG PO TABS
100.0000 ug | ORAL_TABLET | Freq: Every day | ORAL | Status: DC
  Administered 2018-09-03 – 2018-09-04 (×2): 100 ug via ORAL
  Filled 2018-09-02 (×2): qty 1

## 2018-09-02 MED ORDER — HEPARIN SODIUM (PORCINE) 5000 UNIT/ML IJ SOLN
5000.0000 [IU] | Freq: Three times a day (TID) | INTRAMUSCULAR | Status: DC
  Administered 2018-09-03 – 2018-09-04 (×3): 5000 [IU] via SUBCUTANEOUS
  Filled 2018-09-02 (×4): qty 1

## 2018-09-02 MED ORDER — FLUTICASONE PROPIONATE 50 MCG/ACT NA SUSP
2.0000 | Freq: Every day | NASAL | Status: DC
  Administered 2018-09-03 – 2018-09-04 (×2): 2 via NASAL
  Filled 2018-09-02: qty 16

## 2018-09-02 MED ORDER — NITROGLYCERIN 0.4 MG SL SUBL: 0.4000 mg | SUBLINGUAL_TABLET | SUBLINGUAL | Status: DC | PRN

## 2018-09-02 MED ORDER — ATORVASTATIN CALCIUM 20 MG PO TABS
40.0000 mg | ORAL_TABLET | Freq: Every day | ORAL | Status: DC
  Administered 2018-09-03: 40 mg via ORAL
  Filled 2018-09-02: qty 2

## 2018-09-02 MED ORDER — CLINDAMYCIN PHOSPHATE 600 MG/50ML IV SOLN
600.0000 mg | Freq: Three times a day (TID) | INTRAVENOUS | Status: DC
  Administered 2018-09-03 – 2018-09-04 (×5): 600 mg via INTRAVENOUS
  Filled 2018-09-02 (×8): qty 50

## 2018-09-02 MED ORDER — CYCLOBENZAPRINE HCL 10 MG PO TABS: 5.0000 mg | ORAL_TABLET | Freq: Three times a day (TID) | ORAL | Status: DC | PRN

## 2018-09-02 MED ORDER — DOCUSATE SODIUM 100 MG PO CAPS: 100.0000 mg | ORAL_CAPSULE | Freq: Two times a day (BID) | ORAL | Status: DC | PRN

## 2018-09-02 MED ORDER — SODIUM CHLORIDE 0.9 % IV SOLN
INTRAVENOUS | Status: DC | PRN
  Administered 2018-09-03: 250 mL via INTRAVENOUS

## 2018-09-02 MED ORDER — GABAPENTIN 300 MG PO CAPS
300.0000 mg | ORAL_CAPSULE | Freq: Two times a day (BID) | ORAL | Status: DC
  Administered 2018-09-03 (×2): 300 mg via ORAL
  Filled 2018-09-02 (×2): qty 1

## 2018-09-02 MED ORDER — INSULIN ASPART 100 UNIT/ML ~~LOC~~ SOLN: 0.0000 [IU] | Freq: Every day | SUBCUTANEOUS | Status: DC

## 2018-09-02 MED ORDER — ASPIRIN EC 81 MG PO TBEC
81.0000 mg | DELAYED_RELEASE_TABLET | Freq: Every day | ORAL | Status: DC
  Administered 2018-09-03 – 2018-09-04 (×2): 81 mg via ORAL
  Filled 2018-09-02 (×2): qty 1

## 2018-09-02 NOTE — ED Triage Notes (Signed)
Pt states dentist sent pt here. R sided facial swelling x 2 days. Infected tooth With nerve involvement per pt. Pt A&O, ambulatory. Sent here for decrease swelling. Has appt Monday at dentist to get 360 xray performed.

## 2018-09-02 NOTE — H&P (Signed)
[facility] NAME: Beverly Barrett    MR#:  [id number]  DATE OF BIRTH:  Mar 02, 1961  DATE OF ADMISSION:  09/02/2018  PRIMARY CARE PHYSICIAN: Steele Sizer, MD   REQUESTING/REFERRING PHYSICIAN: [email or web address]  CHIEF COMPLAINT:   Chief Complaint  Patient presents with  . Facial Swelling  . Dental Pain    HISTORY OF PRESENT ILLNESS: Beverly Barrett  is a 58 y.o. female with a known history of anginal chest pain, asthma, chronic lower back pain, migraine, depression, diabetes without complication, gastroesophageal reflux disease, hyperlipidemia, hypertension, hypothyroidism, sleep apnea and CPAP use-was having tooth pain in lower jaw for more than last 1 week.  She also had some chills.  She went to her general dentist office 3 days ago and they gave her some Keflex and referred her to oral surgeon.  She went there but they suggested she would need a 3D x-ray of her tooth and they do not have that and gave her some other referral. Meanwhile she continued to have significant pain in her mouth so decided to come to emergency room.  She also had some swelling on her mandible. CT scan in ER showed a small 9 mm abscess near the tooth, ER physician spoke to ENT on-call Dr. Dorinda Hill suggested for an abscess the small size no drainage needed but to give IV antibiotics.  PAST MEDICAL HISTORY:   Past Medical History:  Diagnosis Date  . Anginal pain ([place])    none in approx 2 yrs  . Anxiety   . Asthma   . Chronic lower back pain   . Common migraine with intractable migraine 09/20/2016  . Depression    suicidal ideation  . Diabetes mellitus without complication ([place])   . GERD (gastroesophageal reflux disease)   . Headache    migraines -3x/wk  . Hyperlipidemia   . Hypertension   . Hypothyroidism   . Motion sickness    cars  . Multilevel degenerative disc disease   . Shortness of breath dyspnea   . Sleep apnea    CPAP  . Vertigo   . Wears  dentures    partial upper    PAST SURGICAL HISTORY:  Past Surgical History:  Procedure Laterality Date  . ABDOMINAL HYSTERECTOMY    . bladder tach  1995  . CESAREAN SECTION    . COLONOSCOPY WITH PROPOFOL N/A 05/29/2015   Procedure: COLONOSCOPY WITH PROPOFOL;  Surgeon: Lucilla Lame, MD;  Location: [facility];  Service: Endoscopy;  Laterality: N/A;  Latex allergy sleep apnea - no CPAP machine (yet)  . ESOPHAGOGASTRODUODENOSCOPY N/A 04/23/2017   Procedure: ESOPHAGOGASTRODUODENOSCOPY (EGD);  Surgeon: Lin Landsman, MD;  Location: [facility];  Service: Gastroenterology;  Laterality: N/A;  Latex allergy sleep apnea  . POLYPECTOMY  05/29/2015   Procedure: POLYPECTOMY;  Surgeon: Lucilla Lame, MD;  Location: [facility];  Service: Endoscopy;;    SOCIAL HISTORY:  Social History   Tobacco Use  . Smoking status: Never Smoker  . Smokeless tobacco: Never Used  Substance Use Topics  . Alcohol use: No    Alcohol/week: 0.0 standard drinks    FAMILY HISTORY:  Family History  Problem Relation Age of Onset  . Diabetes Sister   . Anxiety disorder Sister   . Depression Sister   . Hypertension Sister   . Cirrhosis Mother   . Diabetes Mother   . Cirrhosis Father   . Breast cancer Sister 29  . Heart attack Brother   .  Alcohol abuse Brother   . Drug abuse Brother   . Prostate cancer Neg Hx   . Kidney cancer Neg Hx   . Bladder Cancer Neg Hx     DRUG ALLERGIES:  Allergies  Allergen Reactions  . Latex     Pt unsure of reaction pt states something happened while in the hospital after surgery.    REVIEW OF SYSTEMS:   CONSTITUTIONAL: No fever, fatigue or weakness.  EYES: No blurred or double vision.  EARS, NOSE, AND THROAT: No tinnitus or ear pain.  Swelling on the mandible. RESPIRATORY: No cough, shortness of breath, wheezing or hemoptysis.  CARDIOVASCULAR: No chest pain, orthopnea, edema.  GASTROINTESTINAL: No nausea, vomiting, diarrhea or abdominal  pain.  GENITOURINARY: No dysuria, hematuria.  ENDOCRINE: No polyuria, nocturia,  HEMATOLOGY: No anemia, easy bruising or bleeding SKIN: No rash or lesion. MUSCULOSKELETAL: No joint pain or arthritis.   NEUROLOGIC: No tingling, numbness, weakness.  PSYCHIATRY: No anxiety or depression.   MEDICATIONS AT HOME:  Prior to Admission medications   Medication Sig Start Date End Date Taking? Authorizing Provider  albuterol (VENTOLIN HFA) 108 (90 Base) MCG/ACT inhaler Inhale 2 puffs into the lungs every 6 (six) hours as needed for wheezing or shortness of breath. 08/03/18   Steele Sizer, MD  aspirin 81 MG EC tablet Take 81 mg by mouth daily.      [provider]  atorvastatin (LIPITOR) 40 MG tablet TAKE 1 TABLET BY MOUTH AT BEDTIME. GENERIC EQUIVALENT FOR LIPITOR 08/03/18   Steele Sizer, MD  cloNIDine (CATAPRES) 0.1 MG tablet Take 1 tablet (0.1 mg total) by mouth 2 (two) times daily. 08/03/18   Steele Sizer, MD  conjugated estrogens (PREMARIN) vaginal cream Place 1 Applicatorful vaginally daily. Apply 0.5mg  (pea-sized amount)  just inside the vaginal introitus with a finger-tip every night for two weeks and then Monday, Wednesday and Friday nights. 02/24/18   Hollice Espy, MD  cyclobenzaprine (FLEXERIL) 5 MG tablet Take 5 mg by mouth 3 (three) times daily as needed.  04/18/17   Duanne Guess, PA-C  diclofenac (CATAFLAM) 50 MG tablet TAKE 1 TABLET BY MOUTH THREE TIMES DAILY AS NEEDED 07/06/18   Kathrynn Ducking, MD  DULoxetine (CYMBALTA) 20 MG capsule Take 1 capsule (20 mg total) by mouth daily. To be combined with 60 mg to make it 80 mg daily 07/15/18   Ursula Alert, MD  DULoxetine (CYMBALTA) 30 MG capsule Take 1 capsule (30 mg total) by mouth 2 (two) times daily. 04/22/18   Ursula Alert, MD  EQ ALLERGY RELIEF 10 MG tablet TAKE ONE TABLET BY MOUTH ONCE DAILY 07/25/15   Bobetta Lime, MD  fluticasone (FLONASE) 50 MCG/ACT nasal spray Place 2 sprays into both nostrils daily.  04/11/14    [provider]  gabapentin (NEURONTIN) 300 MG capsule One in the morning and 2 in the evening. 07/30/18   Kathrynn Ducking, MD  glucose blood test strip Use once daily. 08/03/18   Steele Sizer, MD  hydrOXYzine (ATARAX/VISTARIL) 25 MG tablet Take 1 tablet (25 mg total) by mouth 3 (three) times daily as needed (for severe anxiety only). 04/22/18   Ursula Alert, MD  levothyroxine (SYNTHROID, LEVOTHROID) 100 MCG tablet Take 1 tablet (100 mcg total) by mouth daily. 08/04/18   Steele Sizer, MD  metoprolol succinate (TOPROL-XL) 50 MG 24 hr tablet TAKE 1 TABLET BY MOUTH DAILY 08/03/18   Sowles, Drue Stager, MD  mometasone (ASMANEX, 120 METERED DOSES,) 220 MCG/INH inhaler Inhale 2 puffs into the lungs  daily. 08/03/18   Steele Sizer, MD  montelukast (SINGULAIR) 10 MG tablet Take 1 tablet (10 mg total) by mouth at bedtime. 08/03/18   Steele Sizer, MD  nitroGLYCERIN (NITROSTAT) 0.4 MG SL tablet Place 1 tablet (0.4 mg total) under the tongue every 5 (five) minutes as needed. 02/20/17   Minna Merritts, MD  olmesartan-hydrochlorothiazide (BENICAR HCT) 40-25 MG tablet Take 1 tablet by mouth daily. 08/03/18   Steele Sizer, MD  omeprazole (PRILOSEC) 40 MG capsule Take 1 capsule (40 mg total) by mouth daily. 08/03/18   Steele Sizer, MD  Plecanatide (TRULANCE) 3 MG TABS Take 1 tablet by mouth daily. In place of linzess 08/05/18   Steele Sizer, MD  QUEtiapine (SEROQUEL) 50 MG tablet Take 1-1.5 tablets (50-75 mg total) by mouth at bedtime. 07/15/18   Ursula Alert, MD  rizatriptan (MAXALT) 10 MG tablet Take 1 tablet (10 mg total) by mouth 3 (three) times daily as needed for migraine. 02/25/18   Kathrynn Ducking, MD  Trospium Chloride 60 MG CP24 Take 1 capsule (60 mg total) by mouth daily. 03/17/18   Zara Council A, PA-C      PHYSICAL EXAMINATION:   VITAL SIGNS: Blood pressure 134/88, pulse (!) 115, temperature 100 F (37.8 C), temperature source Oral, resp. rate 18, height 5\' 5"  (1.651 m), weight  117.9 kg, SpO2 97 %.  GENERAL:  58 y.o.-year-old patient lying in the bed with no acute distress.  EYES: Pupils equal, round, reactive to light and accommodation. No scleral icterus. Extraocular muscles intact.  HEENT: Head atraumatic, normocephalic.  She has swelling on her chin and tender on local pressure on mandible mainly on the right side.  Poor oral hygiene and some decayed tooth.  NECK:  Supple, no jugular venous distention. No thyroid enlargement, no tenderness.  LUNGS: Normal breath sounds bilaterally, no wheezing, rales,rhonchi or crepitation. No use of accessory muscles of respiration.  CARDIOVASCULAR: S1, S2 normal. No murmurs, rubs, or gallops.  ABDOMEN: Soft, nontender, nondistended. Bowel sounds present. No organomegaly or mass.  EXTREMITIES: No pedal edema, cyanosis, or clubbing.  NEUROLOGIC: Cranial nerves II through XII are intact. Muscle strength 5/5 in all extremities. Sensation intact. Gait not checked.  PSYCHIATRIC: The patient is alert and oriented x 3.  SKIN: No obvious rash, lesion, or ulcer.   LABORATORY PANEL:   CBC Recent Labs  Lab 09/02/18 1623  WBC 17.0*  HGB 12.0  HCT 36.8  PLT 300  MCV 82.3  MCH 26.8  MCHC 32.6  RDW 15.8*   ------------------------------------------------------------------------------------------------------------------  Chemistries  Recent Labs  Lab 09/02/18 1623  NA 136  K 3.2*  CL 100  CO2 26  GLUCOSE 162*  BUN 13  CREATININE 0.80  CALCIUM 9.0   ------------------------------------------------------------------------------------------------------------------ estimated creatinine clearance is 97.3 mL/min (by C-G formula based on SCr of 0.8 mg/dL). ------------------------------------------------------------------------------------------------------------------ No results for input(s): TSH, T4TOTAL, T3FREE, THYROIDAB in the last 72 hours.  Invalid input(s): FREET3   Coagulation profile No results for input(s):  INR, PROTIME in the last 168 hours. ------------------------------------------------------------------------------------------------------------------- No results for input(s): DDIMER in the last 72 hours. -------------------------------------------------------------------------------------------------------------------  Cardiac Enzymes No results for input(s): CKMB, TROPONINI, MYOGLOBIN in the last 168 hours.  Invalid input(s): CK ------------------------------------------------------------------------------------------------------------------ Invalid input(s): POCBNP  ---------------------------------------------------------------------------------------------------------------  Urinalysis    Component Value Date/Time   APPEARANCEUR Clear 10/23/2016 1532   GLUCOSEU Negative 10/23/2016 1532   BILIRUBINUR Negative 10/23/2016 1532   PROTEINUR Negative 10/23/2016 1532   UROBILINOGEN negative 07/09/2016 1043   NITRITE Negative 10/23/2016 1532  LEUKOCYTESUR 2+ (A) 10/23/2016 1532     RADIOLOGY: Ct Soft Tissue Neck W Contrast  Result Date: 09/02/2018 CLINICAL DATA:  58 y/o F; right submandibular swelling, toothache, dysphagia, shortness of breath for 1 month. Evaluate for abscess. EXAM: CT NECK WITH CONTRAST TECHNIQUE: Multidetector CT imaging of the neck was performed using the standard protocol following the bolus administration of intravenous contrast. CONTRAST:  76mL ISOVUE-300 IOPAMIDOL (ISOVUE-300) INJECTION 61% COMPARISON:  None. FINDINGS: Pharynx and larynx: Normal. No mass or swelling. Salivary glands: No inflammation, mass, or stone. Thyroid: Normal. Lymph nodes: None enlarged or abnormal density. Vascular: Negative. Limited intracranial: Negative. Visualized orbits: Negative. Mastoids and visualized paranasal sinuses: Clear. Skeleton: No acute or aggressive process. Incidental small foci of condensing osteitis/idiopathic osteosclerosis surrounding the posterior-most mandibular  molars. Mild spondylosis of the cervical spine with small endplate marginal osteophytes. No significant bony foraminal or spinal canal stenosis. Upper chest: Negative. Other: Periapical cyst involving the central and right lateral incisors of the mandible. 9 mm odontogenic abscess within the soft tissues overlying the mandibular periapical cyst. IMPRESSION: Periapical cyst involving both central and the right lateral incisors of mandible. Associated 9 mm odontogenic abscess within the overlying soft tissues. No extension of the abscess into the deep facial or cervical compartments. Electronically Signed   By: Kristine Garbe M.D.   On: 09/02/2018 17:32    EKG: Orders placed or performed in visit on 02/20/17  . EKG 12-Lead    IMPRESSION AND PLAN:  *Dental abscess Need only IV antibiotic as per ENT. Give clindamycin IV. Pain control as needed. Advised to take soft and liquid diet for now.  *Hypertension Continue clonidine and metoprolol.  *Hypothyroidism Continue levothyroxine.  *Hyperlipidemia Continue atorvastatin.  *Depression Continue Cymbalta and Seroquel.  *Sleep apnea She can use her home CPAP at night here.   *Diabetes Keep on insulin sliding scale coverage over here.  All the records are reviewed and case discussed with ED provider. Management plans discussed with the patient, family and they are in agreement.  CODE STATUS: Full code.  Patient's husband and 2 daughters were present in the room during my visit.  TOTAL TIME TAKING CARE OF THIS PATIENT: 50 minutes.    [name] M.D on 09/02/2018   Between 7am to 6pm - Pager - [phone]  After 6pm go to [email or web address] - password [email or web address] [facility]  Office  [phone]  CC: Primary care physician; Steele Sizer, MD   Note: This dictation was prepared with Dragon dictation along with smaller phrase technology. Any transcriptional errors that result from this process  are unintentional.

## 2018-09-02 NOTE — ED Notes (Signed)
Verbal orders per Dr. Kerman Passey d/t pt presentation

## 2018-09-02 NOTE — ED Provider Notes (Signed)
[facility] Emergency Department Provider Note  Time seen: 5:17 PM  I have reviewed the triage vital signs and the nursing notes.   HISTORY  Chief Complaint Facial Swelling and Dental Pain    HPI Beverly Barrett is a 58 y.o. female with a past medical history of anxiety, gastric reflux, hypertension, hyperlipidemia, presents to the emergency department for facial pain and swelling.  Patient states since yesterday she has been experiencing swelling and pain in the right lower mouth and floor the mouth.  States it is getting worse.  Denies any known fever at home, 100.0 in the emergency department.  Patient states her right lower teeth have been hurting for the past 2 weeks or so.  Denies any vomiting.  Has an appointment with her dentist on Monday but came into the ER today due to increased pain and swelling.   Past Medical History:  Diagnosis Date  . Anginal pain ([place])    none in approx 2 yrs  . Anxiety   . Asthma   . Chronic lower back pain   . Common migraine with intractable migraine 09/20/2016  . Depression    suicidal ideation  . Diabetes mellitus without complication ([place])   . GERD (gastroesophageal reflux disease)   . Headache    migraines -3x/wk  . Hyperlipidemia   . Hypertension   . Hypothyroidism   . Motion sickness    cars  . Multilevel degenerative disc disease   . Shortness of breath dyspnea   . Sleep apnea    CPAP  . Vertigo   . Wears dentures    partial upper    Patient Active Problem List   Diagnosis Date Noted  . Grade II internal hemorrhoids 05/16/2017  . Mixed stress and urge urinary incontinence 11/12/2016  . Degenerative arthritis of lumbar spine 11/12/2016  . Common migraine with intractable migraine 09/20/2016  . Iron deficiency anemia 09/12/2016  . GAD (generalized anxiety disorder) 12/20/2015  . OSA on CPAP 12/20/2015  . Angina effort 12/20/2015  . Hyperglycemia 09/22/2015  . History of colonic polyps   . Benign  neoplasm of sigmoid colon   . Frequency 04/14/2015  . Backache 04/14/2015  . Chronic pain of multiple joints 06/14/2014  . Dysmetabolic syndrome 09/32/6712  . Acid reflux 06/14/2014  . Extreme obesity 06/14/2014  . Arthritis, degenerative 06/14/2014  . Morbid obesity with BMI of 40.0-44.9, adult ([place]) 06/14/2014  . Hypothyroidism due to acquired atrophy of thyroid 08/18/2013  . Hypothyroidism, unspecified 08/18/2013  . Hypertension goal BP (blood pressure) < 140/90 12/07/2010  . Familial multiple lipoprotein-type hyperlipidemia 12/07/2010  . CN (constipation) 11/21/2009  . Allergic rhinitis 10/13/2008  . Asthma, mild intermittent, well-controlled 09/01/2008  . Major depressive disorder, recurrent episode, in partial remission with anxious distress ([place]) 08/13/2007    Past Surgical History:  Procedure Laterality Date  . ABDOMINAL HYSTERECTOMY    . bladder tach  1995  . CESAREAN SECTION    . COLONOSCOPY WITH PROPOFOL N/A 05/29/2015   Procedure: COLONOSCOPY WITH PROPOFOL;  Surgeon: Lucilla Lame, MD;  Location: [place];  Service: Endoscopy;  Laterality: N/A;  Latex allergy sleep apnea - no CPAP machine (yet)  . ESOPHAGOGASTRODUODENOSCOPY N/A 04/23/2017   Procedure: ESOPHAGOGASTRODUODENOSCOPY (EGD);  Surgeon: Lin Landsman, MD;  Location: [place];  Service: Gastroenterology;  Laterality: N/A;  Latex allergy sleep apnea  . POLYPECTOMY  05/29/2015   Procedure: POLYPECTOMY;  Surgeon: Lucilla Lame, MD;  Location: [place];  Service: Endoscopy;;  Prior to Admission medications   Medication Sig Start Date End Date Taking? Authorizing Provider  albuterol (VENTOLIN HFA) 108 (90 Base) MCG/ACT inhaler Inhale 2 puffs into the lungs every 6 (six) hours as needed for wheezing or shortness of breath. 08/03/18   Steele Sizer, MD  aspirin 81 MG EC tablet Take 81 mg by mouth daily.      [provider]  atorvastatin (LIPITOR) 40 MG tablet TAKE 1  TABLET BY MOUTH AT BEDTIME. GENERIC EQUIVALENT FOR LIPITOR 08/03/18   Steele Sizer, MD  cloNIDine (CATAPRES) 0.1 MG tablet Take 1 tablet (0.1 mg total) by mouth 2 (two) times daily. 08/03/18   Steele Sizer, MD  conjugated estrogens (PREMARIN) vaginal cream Place 1 Applicatorful vaginally daily. Apply 0.5mg  (pea-sized amount)  just inside the vaginal introitus with a finger-tip every night for two weeks and then Monday, Wednesday and Friday nights. 02/24/18   Hollice Espy, MD  cyclobenzaprine (FLEXERIL) 5 MG tablet Take 5 mg by mouth 3 (three) times daily as needed.  04/18/17   Duanne Guess, PA-C  diclofenac (CATAFLAM) 50 MG tablet TAKE 1 TABLET BY MOUTH THREE TIMES DAILY AS NEEDED 07/06/18   Kathrynn Ducking, MD  DULoxetine (CYMBALTA) 20 MG capsule Take 1 capsule (20 mg total) by mouth daily. To be combined with 60 mg to make it 80 mg daily 07/15/18   Ursula Alert, MD  DULoxetine (CYMBALTA) 30 MG capsule Take 1 capsule (30 mg total) by mouth 2 (two) times daily. 04/22/18   Ursula Alert, MD  EQ ALLERGY RELIEF 10 MG tablet TAKE ONE TABLET BY MOUTH ONCE DAILY 07/25/15   Bobetta Lime, MD  fluticasone (FLONASE) 50 MCG/ACT nasal spray Place 2 sprays into both nostrils daily.  04/11/14   [provider]  gabapentin (NEURONTIN) 300 MG capsule One in the morning and 2 in the evening. 07/30/18   Kathrynn Ducking, MD  glucose blood test strip Use once daily. 08/03/18   Steele Sizer, MD  hydrOXYzine (ATARAX/VISTARIL) 25 MG tablet Take 1 tablet (25 mg total) by mouth 3 (three) times daily as needed (for severe anxiety only). 04/22/18   Ursula Alert, MD  levothyroxine (SYNTHROID, LEVOTHROID) 100 MCG tablet Take 1 tablet (100 mcg total) by mouth daily. 08/04/18   Steele Sizer, MD  metoprolol succinate (TOPROL-XL) 50 MG 24 hr tablet TAKE 1 TABLET BY MOUTH DAILY 08/03/18   Sowles, Drue Stager, MD  mometasone (ASMANEX, 120 METERED DOSES,) 220 MCG/INH inhaler Inhale 2 puffs into the lungs daily.  08/03/18   Steele Sizer, MD  montelukast (SINGULAIR) 10 MG tablet Take 1 tablet (10 mg total) by mouth at bedtime. 08/03/18   Steele Sizer, MD  nitroGLYCERIN (NITROSTAT) 0.4 MG SL tablet Place 1 tablet (0.4 mg total) under the tongue every 5 (five) minutes as needed. 02/20/17   Minna Merritts, MD  olmesartan-hydrochlorothiazide (BENICAR HCT) 40-25 MG tablet Take 1 tablet by mouth daily. 08/03/18   Steele Sizer, MD  omeprazole (PRILOSEC) 40 MG capsule Take 1 capsule (40 mg total) by mouth daily. 08/03/18   Steele Sizer, MD  Plecanatide (TRULANCE) 3 MG TABS Take 1 tablet by mouth daily. In place of linzess 08/05/18   Steele Sizer, MD  QUEtiapine (SEROQUEL) 50 MG tablet Take 1-1.5 tablets (50-75 mg total) by mouth at bedtime. 07/15/18   Ursula Alert, MD  rizatriptan (MAXALT) 10 MG tablet Take 1 tablet (10 mg total) by mouth 3 (three) times daily as needed for migraine. 02/25/18   Kathrynn Ducking, MD  Trospium Chloride 60 MG CP24 Take 1 capsule (60 mg total) by mouth daily. 03/17/18   Zara Council A, PA-C    Allergies  Allergen Reactions  . Latex     Pt unsure of reaction pt states something happened while in the hospital after surgery.    Family History  Problem Relation Age of Onset  . Diabetes Sister   . Anxiety disorder Sister   . Depression Sister   . Hypertension Sister   . Cirrhosis Mother   . Diabetes Mother   . Cirrhosis Father   . Breast cancer Sister 39  . Heart attack Brother   . Alcohol abuse Brother   . Drug abuse Brother   . Prostate cancer Neg Hx   . Kidney cancer Neg Hx   . Bladder Cancer Neg Hx     Social History Social History   Tobacco Use  . Smoking status: Never Smoker  . Smokeless tobacco: Never Used  Substance Use Topics  . Alcohol use: No    Alcohol/week: 0.0 standard drinks  . Drug use: No    Review of Systems Constitutional: Low-grade fever in the emergency department, unknown to patient. ENT: Pain and swelling of the right lower  jaw on floor the mouth. Cardiovascular: Negative for chest pain. Respiratory: Negative for shortness of breath. Gastrointestinal: Negative for abdominal pain, vomiting  Musculoskeletal: Negative for musculoskeletal complaints Skin: Swelling of the right jaw/lower mouth. Neurological: Negative for headache All other ROS negative  ____________________________________________   PHYSICAL EXAM:  VITAL SIGNS: ED Triage Vitals [09/02/18 1612]  Enc Vitals Group     BP 134/88     Pulse Rate (!) 115     Resp 18     Temp 100 F (37.8 C)     Temp Source Oral     SpO2 97 %     Weight 260 lb (117.9 kg)     Height 5\' 5"  (1.651 m)     Head Circumference      Peak Flow      Pain Score 9     Pain Loc      Pain Edu?      Excl. in [place]?    Constitutional: Alert and oriented. Well appearing and in no distress. Eyes: Normal exam ENT   Head: Normocephalic and atraumatic.   Mouth/Throat: Mucous membranes are moist.  Patient has significant tenderness to palpation with moderate swelling of the right lower jaw and somewhat into the floor the mouth. Cardiovascular: Normal rate, regular rhythm. No murmurs, rubs, or gallops. Respiratory: Normal respiratory effort without tachypnea nor retractions. Breath sounds are clear  Gastrointestinal: Soft and nontender. No distention.  Musculoskeletal: Nontender with normal range of motion in all extremities. Neurologic:  Normal speech and language. No gross focal neurologic deficits  Skin:  Skin is warm, dry and intact.  Psychiatric: Mood and affect are normal.   ____________________________________________   RADIOLOGY  IMPRESSION: Periapical cyst involving both central and the right lateral incisors of mandible. Associated 9 mm odontogenic abscess within the overlying soft tissues. No extension of the abscess into the deep facial or cervical compartments.  ____________________________________________   INITIAL IMPRESSION / ASSESSMENT AND  PLAN / ED COURSE  Pertinent labs & imaging results that were available during my care of the patient were reviewed by me and considered in my medical decision making (see chart for details).  Patient presents to the emergency department for pain and swelling to the right lower jaw extending to the floor the  mouth.  Exam is consistent with likely dental infection, cellulitis, no obvious abscess during my exam.  Exam is somewhat consistent for possible developing Ludwig's angina.  We will obtain CT imaging with contrast to further evaluate.  Patient has low-grade temperature, tachycardic to 115.  White blood cell count has elevated to 17,000.  We will start IV Unasyn, dose IV Decadron while awaiting CT results.  Patient CT scan shows odontogenic abscess likely surrounding cellulitis, no deep infection noted.  As the patient is tachycardic with a leukocytosis of 17,000 and febrile to 100.0 we will continue with IV antibiotics.  I discussed the patient with Dr. Richardson Landry, no surgical involvement needed at this time, recommends IV antibiotics.  I discussed patient with the hospitalist will be admitting.  ____________________________________________   FINAL CLINICAL IMPRESSION(S) / ED DIAGNOSES  Dental infection Facial cellulitis Dental abscess   Harvest Dark, MD 09/02/18 [id number]

## 2018-09-02 NOTE — ED Notes (Signed)
Swelling to right lower lip, pt denies SOB.

## 2018-09-02 NOTE — Progress Notes (Signed)
Home CPAP unit set up and assessed by RT. Patient states she is not ready for bed at this time and she will self place CPAP and does not need assistance. RT informed patient to call for assistance if needed.

## 2018-09-02 NOTE — Progress Notes (Signed)
Patient has home CPAP at bedside. RT assessed home unit and found it to be in good working condition. No loose or frayed wires. Plugged into red outlet.

## 2018-09-03 ENCOUNTER — Other Ambulatory Visit: Payer: Self-pay

## 2018-09-03 ENCOUNTER — Ambulatory Visit: Payer: BLUE CROSS/BLUE SHIELD | Admitting: Licensed Clinical Social Worker

## 2018-09-03 LAB — CBC
HCT: 35.3 % — ABNORMAL LOW (ref 36.0–46.0)
Hemoglobin: 11.6 g/dL — ABNORMAL LOW (ref 12.0–15.0)
MCH: 26.5 pg (ref 26.0–34.0)
MCHC: 32.9 g/dL (ref 30.0–36.0)
MCV: 80.8 fL (ref 80.0–100.0)
NRBC: 0 % (ref 0.0–0.2)
Platelets: 299 10*3/uL (ref 150–400)
RBC: 4.37 MIL/uL (ref 3.87–5.11)
RDW: 15.9 % — ABNORMAL HIGH (ref 11.5–15.5)
WBC: 15.1 10*3/uL — ABNORMAL HIGH (ref 4.0–10.5)

## 2018-09-03 LAB — BASIC METABOLIC PANEL
Anion gap: 10 (ref 5–15)
BUN: 14 mg/dL (ref 6–20)
CO2: 24 mmol/L (ref 22–32)
Calcium: 9.2 mg/dL (ref 8.9–10.3)
Chloride: 102 mmol/L (ref 98–111)
Creatinine, Ser: 0.79 mg/dL (ref 0.44–1.00)
GFR calc Af Amer: >60 mL/min (ref >60)
GFR calc non Af Amer: >60 mL/min (ref >60)
Glucose, Bld: 209 mg/dL — ABNORMAL HIGH (ref 70–99)
Potassium: 3.9 mmol/L (ref 3.5–5.1)
SODIUM: 136 mmol/L (ref 135–145)

## 2018-09-03 LAB — GLUCOSE, CAPILLARY
GLUCOSE-CAPILLARY: 153 mg/dL — AB (ref 70–99)
Glucose-Capillary: 177 mg/dL — ABNORMAL HIGH (ref 70–99)
Glucose-Capillary: 197 mg/dL — ABNORMAL HIGH (ref 70–99)
Glucose-Capillary: 131 mg/dL — ABNORMAL HIGH (ref 70–99)

## 2018-09-03 MED ORDER — DULOXETINE HCL 20 MG PO CPEP
20.0000 mg | ORAL_CAPSULE | Freq: Three times a day (TID) | ORAL | Status: DC
  Administered 2018-09-03 – 2018-09-04 (×2): 20 mg via ORAL
  Filled 2018-09-03 (×5): qty 1

## 2018-09-03 MED ORDER — GABAPENTIN 300 MG PO CAPS
600.0000 mg | ORAL_CAPSULE | Freq: Every day | ORAL | Status: DC
  Administered 2018-09-03: 600 mg via ORAL
  Filled 2018-09-03: qty 2

## 2018-09-03 MED ORDER — QUETIAPINE FUMARATE 25 MG PO TABS
25.0000 mg | ORAL_TABLET | Freq: Every day | ORAL | Status: DC
  Administered 2018-09-03: 25 mg via ORAL
  Filled 2018-09-03: qty 1

## 2018-09-03 MED ORDER — ONDANSETRON HCL 4 MG/2ML IJ SOLN
4.0000 mg | Freq: Four times a day (QID) | INTRAMUSCULAR | Status: DC | PRN
  Administered 2018-09-03: 4 mg via INTRAVENOUS
  Filled 2018-09-03: qty 2

## 2018-09-03 MED ORDER — GABAPENTIN 300 MG PO CAPS
300.0000 mg | ORAL_CAPSULE | Freq: Every morning | ORAL | Status: DC
  Administered 2018-09-04: 300 mg via ORAL
  Filled 2018-09-03: qty 1

## 2018-09-03 MED ORDER — SACCHAROMYCES BOULARDII 250 MG PO CAPS
250.0000 mg | ORAL_CAPSULE | Freq: Two times a day (BID) | ORAL | Status: DC
  Administered 2018-09-03 – 2018-09-04 (×4): 250 mg via ORAL
  Filled 2018-09-03 (×4): qty 1

## 2018-09-03 NOTE — Progress Notes (Signed)
[facility] HCPOA Consult   09/03/18 0900  Clinical Encounter Type  Visited With Patient and family together  Visit Type Initial;Other (Comment) (HCPOA)  Referral From Physician  Consult/Referral To Chaplain  Spiritual Encounters  Spiritual Needs Prayer  Stress Factors  Patient Stress Factors Health changes  Family Stress Factors Health changes  Advance Directives (For Healthcare)  Does Patient Have a Medical Advance Directive? No   Pt was sitting up in bed and watching TV with husband and daughter.  Pt was alert and cheerful.  Melven Sartorius presented education about HCPOA.  Since pt's husband legally holds HCPOA in [place], pt understands that they do not need to complete but may be wise to complete to make health care wishes known.  Pt will notify RN should she decided to complete HCPOA.  Ppwk was left with pt.  Pt requested prayer.  Melven Sartorius prayed with pt and family for healing.  Darcey Nora, Chaplain

## 2018-09-03 NOTE — Consult Note (Signed)
Beverly Barrett, Beverly Barrett [id number] 1961/01/08 Beverly Nearing, MD  Reason for Consult: Neck swelling, dental abscess Requesting Physician: Beverly Bow, MD Consulting Physician: Beverly Nearing, MD  HPI: This 58 y.o. year old female was admitted on 09/02/2018 for Dental abscess [K04.7] Facial cellulitis [L03.211]. CT scan of the neck showed a periapical tooth abscess around the roots of the central lower incisors with erosion of the anterior mandible cortex and small subperiostial abscess versus phlegmon without enhancement. She was started on Clindamycin and the pain and swelling has markedly improved. They had seen a dentist about the teeth and had a 3D scan recommended, which they had not done yet.  Medications:  Current Facility-Administered Medications  Medication Dose Route Frequency Provider Last Rate Last Dose  . 0.9 %  sodium chloride infusion   Intravenous PRN Epifanio Lesches, MD 10 mL/hr at 09/03/18 0000 250 mL at 09/03/18 0000  . acetaminophen (TYLENOL) tablet 650 mg  650 mg Oral Q6H PRN Beverly Basta, MD      . albuterol (PROVENTIL) (2.5 MG/3ML) 0.083% nebulizer solution 3 mL  3 mL Inhalation Q6H PRN Beverly Basta, MD      . aspirin EC tablet 81 mg  81 mg Oral Daily Beverly Basta, MD   81 mg at 09/03/18 0831  . atorvastatin (LIPITOR) tablet 40 mg  40 mg Oral q1800 Beverly Basta, MD      . budesonide (PULMICORT) nebulizer solution 0.25 mg  0.25 mg Nebulization BID Beverly Basta, MD   0.25 mg at 09/03/18 0907  . clindamycin (CLEOCIN) IVPB 600 mg  600 mg Intravenous Q8H Beverly Basta, MD 100 mL/hr at 09/03/18 0855 600 mg at 09/03/18 0855  . cloNIDine (CATAPRES) tablet 0.1 mg  0.1 mg Oral BID Beverly Basta, MD   0.1 mg at 09/03/18 5284  . cyclobenzaprine (FLEXERIL) tablet 5 mg  5 mg Oral TID PRN Beverly Basta, MD      . docusate sodium (COLACE) capsule 100 mg  100 mg Oral BID PRN Beverly Basta, MD      .  DULoxetine (CYMBALTA) DR capsule 30 mg  30 mg Oral BID Beverly Basta, MD   30 mg at 09/03/18 1324  . fluticasone (FLONASE) 50 MCG/ACT nasal spray 2 spray  2 spray Each Nare Daily Beverly Basta, MD   2 spray at 09/03/18 0849  . [START ON 09/04/2018] gabapentin (NEURONTIN) capsule 300 mg  300 mg Oral q morning - 10a [name], [name], MD       And  . gabapentin (NEURONTIN) capsule 600 mg  600 mg Oral QHS [name], [name], MD      . heparin injection 5,000 Units  5,000 Units Subcutaneous Q8H Beverly Basta, MD   5,000 Units at 09/03/18 0759  . insulin aspart (novoLOG) injection 0-5 Units  0-5 Units Subcutaneous QHS Beverly Basta, MD      . insulin aspart (novoLOG) injection 0-9 Units  0-9 Units Subcutaneous TID WC Beverly Basta, MD   2 Units at 09/03/18 0850  . levothyroxine (SYNTHROID, LEVOTHROID) tablet 100 mcg  100 mcg Oral QAC breakfast Beverly Basta, MD   100 mcg at 09/03/18 0831  . metoprolol succinate (TOPROL-XL) 24 hr tablet 50 mg  50 mg Oral Daily Beverly Basta, MD   50 mg at 09/03/18 4010  . montelukast (SINGULAIR) tablet 10 mg  10 mg Oral QHS Beverly Basta, MD   10 mg at 09/03/18 0006  . nitroGLYCERIN (NITROSTAT) SL tablet 0.4 mg  0.4 mg Sublingual Q5 min PRN Beverly Basta, MD      .  oxyCODONE-acetaminophen (PERCOCET/ROXICET) 5-325 MG per tablet 1 tablet  1 tablet Oral Q6H PRN Beverly Basta, MD   1 tablet at 09/03/18 0849  . pantoprazole (PROTONIX) EC tablet 40 mg  40 mg Oral Daily Beverly Basta, MD   40 mg at 09/03/18 0831  . QUEtiapine (SEROQUEL) tablet 50-75 mg  50-75 mg Oral QHS Beverly Basta, MD   25 mg at 09/03/18 0007  . saccharomyces boulardii (FLORASTOR) capsule 250 mg  250 mg Oral BID Beverly Bow, MD   250 mg at 09/03/18 1125  . SUMAtriptan (IMITREX) tablet 50 mg  50 mg Oral Q2H PRN Beverly Basta, MD   50 mg at 09/03/18 1037  .  Medications Prior to Admission   Medication Sig Dispense Refill  . aspirin 81 MG EC tablet Take 81 mg by mouth daily.      Beverly Barrett atorvastatin (LIPITOR) 40 MG tablet TAKE 1 TABLET BY MOUTH AT BEDTIME. GENERIC EQUIVALENT FOR LIPITOR 90 tablet 1  . cloNIDine (CATAPRES) 0.1 MG tablet Take 1 tablet (0.1 mg total) by mouth 2 (two) times daily. 180 tablet 1  . conjugated estrogens (PREMARIN) vaginal cream Place 1 Applicatorful vaginally daily. Apply 0.5mg  (pea-sized amount)  just inside the vaginal introitus with a finger-tip every night for two weeks and then Monday, Wednesday and Friday nights. 30 g 12  . DULoxetine (CYMBALTA) 20 MG capsule Take 1 capsule (20 mg total) by mouth daily. To be combined with 60 mg to make it 80 mg daily 90 capsule 1  . DULoxetine (CYMBALTA) 30 MG capsule Take 1 capsule (30 mg total) by mouth 2 (two) times daily. 180 capsule 1  . gabapentin (NEURONTIN) 300 MG capsule One in the morning and 2 in the evening. 90 capsule 3  . levothyroxine (SYNTHROID, LEVOTHROID) 100 MCG tablet Take 1 tablet (100 mcg total) by mouth daily. 90 tablet 0  . metoprolol succinate (TOPROL-XL) 50 MG 24 hr tablet TAKE 1 TABLET BY MOUTH DAILY 90 tablet 1  . montelukast (SINGULAIR) 10 MG tablet Take 1 tablet (10 mg total) by mouth at bedtime. 90 tablet 1  . olmesartan-hydrochlorothiazide (BENICAR HCT) 40-25 MG tablet Take 1 tablet by mouth daily. 90 tablet 1  . omeprazole (PRILOSEC) 40 MG capsule Take 1 capsule (40 mg total) by mouth daily. 90 capsule 1  . QUEtiapine (SEROQUEL) 50 MG tablet Take 1-1.5 tablets (50-75 mg total) by mouth at bedtime. 135 tablet 0  . Trospium Chloride 60 MG CP24 Take 1 capsule (60 mg total) by mouth daily. 90 each 3  . albuterol (VENTOLIN HFA) 108 (90 Base) MCG/ACT inhaler Inhale 2 puffs into the lungs every 6 (six) hours as needed for wheezing or shortness of breath. 3 Inhaler 0  . cyclobenzaprine (FLEXERIL) 5 MG tablet Take 5 mg by mouth 3 (three) times daily as needed.   0  . diclofenac (CATAFLAM) 50 MG  tablet TAKE 1 TABLET BY MOUTH THREE TIMES DAILY AS NEEDED 60 tablet 2  . EQ ALLERGY RELIEF 10 MG tablet TAKE ONE TABLET BY MOUTH ONCE DAILY 90 tablet 1  . fluticasone (FLONASE) 50 MCG/ACT nasal spray Place 2 sprays into both nostrils daily.     Beverly Barrett glucose blood test strip Use once daily. 100 each 12  . hydrOXYzine (ATARAX/VISTARIL) 25 MG tablet Take 1 tablet (25 mg total) by mouth 3 (three) times daily as needed (for severe anxiety only). 270 tablet 1  . mometasone (ASMANEX, 120 METERED DOSES,) 220 MCG/INH inhaler Inhale 2 puffs into the lungs daily.  3 Inhaler 1  . nitroGLYCERIN (NITROSTAT) 0.4 MG SL tablet Place 1 tablet (0.4 mg total) under the tongue every 5 (five) minutes as needed. 25 tablet 0  . Plecanatide (TRULANCE) 3 MG TABS Take 1 tablet by mouth daily. In place of linzess 90 tablet 1  . rizatriptan (MAXALT) 10 MG tablet Take 1 tablet (10 mg total) by mouth 3 (three) times daily as needed for migraine. 10 tablet 5    Allergies:  Allergies  Allergen Reactions  . Latex Swelling    Pt unsure of reaction pt states something happened while in the hospital after surgery.    PMH:  Past Medical History:  Diagnosis Date  . Anginal pain ([place])    none in approx 2 yrs  . Anxiety   . Asthma   . Chronic lower back pain   . Common migraine with intractable migraine 09/20/2016  . Depression    suicidal ideation  . Diabetes mellitus without complication ([place])   . GERD (gastroesophageal reflux disease)   . Headache    migraines -3x/wk  . Hyperlipidemia   . Hypertension   . Hypothyroidism   . Motion sickness    cars  . Multilevel degenerative disc disease   . Shortness of breath dyspnea   . Sleep apnea    CPAP  . Vertigo   . Wears dentures    partial upper    Fam Hx:  Family History  Problem Relation Age of Onset  . Diabetes Sister   . Anxiety disorder Sister   . Depression Sister   . Hypertension Sister   . Cirrhosis Mother   . Diabetes Mother   . Cirrhosis Father   .  Breast cancer Sister 79  . Heart attack Brother   . Alcohol abuse Brother   . Drug abuse Brother   . Prostate cancer Neg Hx   . Kidney cancer Neg Hx   . Bladder Cancer Neg Hx     Soc Hx:  Social History   Socioeconomic History  . Marital status: Married    Spouse name: clayton sr  . Number of children: 3  . Years of education: Not on file  . Highest education level: GED or equivalent  Occupational History  . Not on file  Social Needs  . Financial resource strain: Very hard  . Food insecurity:    Worry: Often true    Inability: Often true  . Transportation needs:    Medical: No    Non-medical: No  Tobacco Use  . Smoking status: Never Smoker  . Smokeless tobacco: Never Used  Substance and Sexual Activity  . Alcohol use: No    Alcohol/week: 0.0 standard drinks  . Drug use: No  . Sexual activity: Yes    Partners: Male  Lifestyle  . Physical activity:    Days per week: 2 days    Minutes per session: 30 min  . Stress: Very much  Relationships  . Social connections:    Talks on phone: Three times a week    Gets together: Once a week    Attends religious service: More than 4 times per year    Active member of club or organization: No    Attends meetings of clubs or organizations: Never    Relationship status: Married  . Intimate partner violence:    Fear of current or ex partner: No    Emotionally abused: No    Physically abused: No    Forced sexual activity: No  Other  Topics Concern  . Not on file  Social History Narrative   Lives with husband and one daughter other kids are out of the house   She does not work, husband on disability     PSH:  Past Surgical History:  Procedure Laterality Date  . ABDOMINAL HYSTERECTOMY    . bladder tach  1995  . CESAREAN SECTION    . COLONOSCOPY WITH PROPOFOL N/A 05/29/2015   Procedure: COLONOSCOPY WITH PROPOFOL;  Surgeon: Lucilla Lame, MD;  Location: [place];  Service: Endoscopy;  Laterality: N/A;  Latex  allergy sleep apnea - no CPAP machine (yet)  . ESOPHAGOGASTRODUODENOSCOPY N/A 04/23/2017   Procedure: ESOPHAGOGASTRODUODENOSCOPY (EGD);  Surgeon: Lin Landsman, MD;  Location: [facility];  Service: Gastroenterology;  Laterality: N/A;  Latex allergy sleep apnea  . POLYPECTOMY  05/29/2015   Procedure: POLYPECTOMY;  Surgeon: Lucilla Lame, MD;  Location: [place];  Service: Endoscopy;;  . Procedures since admission: No admission procedures for hospital encounter.  ROS: Review of systems normal other than 12 systems except per HPI.  PHYSICAL EXAM  Vitals: Blood pressure 108/71, pulse 90, temperature 98.2 F (36.8 C), temperature source Oral, resp. rate 16, height 5\' 6"  (1.676 m), weight 116.5 kg, SpO2 97 %.. General: Well-developed, Well-nourished in no acute distress Mood: Mood and affect well adjusted, pleasant and cooperative. Orientation: Grossly alert and oriented. Vocal Quality: No hoarseness. Communicates verbally. head and Face: NCAT. No facial asymmetry. No visible skin lesions. No significant facial scars. No tenderness with sinus percussion. Facial strength normal and symmetric. Ears: External ears with normal landmarks, no lesions. External auditory canals free of infection, cerumen impaction or lesions. Tympanic membranes intact with good landmarks and normal mobility on pneumatic otoscopy. No middle ear effusion. Hearing: Speech reception grossly normal. Nose: External nose normal with midline dorsum and no lesions or deformity. Nasal Cavity reveals essentially midline septum with normal inferior turbinates. No significant mucosal congestion or erythema. Nasal secretions are minimal and clear. No polyps seen on anterior rhinoscopy. Oral Cavity/ Oropharynx: Lips are normal with no lesions. Teeth shows some evidence of decay around the lower central incisors with edema of the adjacent gingiva, but no drainage and no swelling of the floor of the mouth. Oropharynx  including tongue, buccal mucosa, floor of mouth, hard and soft palate, uvula and posterior pharynx free of exudates, erythema or lesions with normal symmetry and hydration.  Indirect Laryngoscopy/Nasopharyngoscopy: Visualization of the larynx, hypopharynx and nasopharynx is not possible in this setting with routine examination. Neck: Supple and symmetric with no palpable masses. Mild tenderness in submental region but area is soft with no fluctuance or mass. The trachea is midline. Thyroid gland is soft, nontender and symmetric with no masses or enlargement. Parotid and submandibular glands are soft, nontender and symmetric, without masses. Lymphatic: Cervical lymph nodes are without palpable lymphadenopathy or tenderness. Respiratory: Normal respiratory effort without labored breathing. Cardiovascular: Carotid pulse shows regular rate and rhythm Neurologic: Cranial Nerves II through XII are grossly intact. Eyes: Gaze and Ocular Motility are grossly normal. PERRLA. No visible nystagmus.  MEDICAL DECISION MAKING: Data Review:  Results for orders placed or performed during the hospital encounter of 09/02/18 (from the past 48 hour(s))  CBC     Status: Abnormal   Collection Time: 09/02/18  4:23 PM  Result Value Ref Range   WBC 17.0 (H) 4.0 - 10.5 K/uL   RBC 4.47 3.87 - 5.11 MIL/uL   Hemoglobin 12.0 12.0 - 15.0 g/dL   HCT  36.8 36.0 - 46.0 %   MCV 82.3 80.0 - 100.0 fL   MCH 26.8 26.0 - 34.0 pg   MCHC 32.6 30.0 - 36.0 g/dL   RDW 15.8 (H) 11.5 - 15.5 %   Platelets 300 150 - 400 K/uL   nRBC 0.0 0.0 - 0.2 %    Comment: Performed at [facility], [address] [place], [place] [address]  Basic metabolic panel     Status: Abnormal   Collection Time: 09/02/18  4:23 PM  Result Value Ref Range   Sodium 136 135 - 145 mmol/L   Potassium 3.2 (L) 3.5 - 5.1 mmol/L   Chloride 100 98 - 111 mmol/L   CO2 26 22 - 32 mmol/L   Glucose, Bld 162 (H) 70 - 99 mg/dL   BUN 13 6 - 20 mg/dL   Creatinine,  Ser 0.80 0.44 - 1.00 mg/dL   Calcium 9.0 8.9 - 10.3 mg/dL   GFR calc non Af Amer >60 >60 mL/min   GFR calc Af Amer >60 >60 mL/min   Anion gap 10 5 - 15    Comment: Performed at [facility], [address] [place], [place] [address]  Glucose, capillary     Status: Abnormal   Collection Time: 09/02/18 10:51 PM  Result Value Ref Range   Glucose-Capillary 219 (H) 70 - 99 mg/dL   Comment 1 Notify RN   Basic metabolic panel     Status: Abnormal   Collection Time: 09/03/18  2:57 AM  Result Value Ref Range   Sodium 136 135 - 145 mmol/L   Potassium 3.9 3.5 - 5.1 mmol/L   Chloride 102 98 - 111 mmol/L   CO2 24 22 - 32 mmol/L   Glucose, Bld 209 (H) 70 - 99 mg/dL   BUN 14 6 - 20 mg/dL   Creatinine, Ser 0.79 0.44 - 1.00 mg/dL   Calcium 9.2 8.9 - 10.3 mg/dL   GFR calc non Af Amer >60 >60 mL/min   GFR calc Af Amer >60 >60 mL/min   Anion gap 10 5 - 15    Comment: Performed at [facility], [address] [place], [place] [address]  CBC     Status: Abnormal   Collection Time: 09/03/18  2:57 AM  Result Value Ref Range   WBC 15.1 (H) 4.0 - 10.5 K/uL   RBC 4.37 3.87 - 5.11 MIL/uL   Hemoglobin 11.6 (L) 12.0 - 15.0 g/dL   HCT 35.3 (L) 36.0 - 46.0 %   MCV 80.8 80.0 - 100.0 fL   MCH 26.5 26.0 - 34.0 pg   MCHC 32.9 30.0 - 36.0 g/dL   RDW 15.9 (H) 11.5 - 15.5 %   Platelets 299 150 - 400 K/uL   nRBC 0.0 0.0 - 0.2 %    Comment: Performed at [facility], [address] [place], [place] [address]  Glucose, capillary     Status: Abnormal   Collection Time: 09/03/18  7:56 AM  Result Value Ref Range   Glucose-Capillary 177 (H) 70 - 99 mg/dL  Glucose, capillary     Status: Abnormal   Collection Time: 09/03/18 11:23 AM  Result Value Ref Range   Glucose-Capillary 197 (H) 70 - 99 mg/dL  . Ct Soft Tissue Neck W Contrast  Result Date: 09/02/2018 CLINICAL DATA:  58 y/o F; right submandibular swelling, toothache, dysphagia, shortness of breath for 1 month. Evaluate for  abscess. EXAM: CT NECK WITH CONTRAST TECHNIQUE: Multidetector CT imaging of  the neck was performed using the standard protocol following the bolus administration of intravenous contrast. CONTRAST:  55mL ISOVUE-300 IOPAMIDOL (ISOVUE-300) INJECTION 61% COMPARISON:  None. FINDINGS: Pharynx and larynx: Normal. No mass or swelling. Salivary glands: No inflammation, mass, or stone. Thyroid: Normal. Lymph nodes: None enlarged or abnormal density. Vascular: Negative. Limited intracranial: Negative. Visualized orbits: Negative. Mastoids and visualized paranasal sinuses: Clear. Skeleton: No acute or aggressive process. Incidental small foci of condensing osteitis/idiopathic osteosclerosis surrounding the posterior-most mandibular molars. Mild spondylosis of the cervical spine with small endplate marginal osteophytes. No significant bony foraminal or spinal canal stenosis. Upper chest: Negative. Other: Periapical cyst involving the central and right lateral incisors of the mandible. 9 mm odontogenic abscess within the soft tissues overlying the mandibular periapical cyst. IMPRESSION: Periapical cyst involving both central and the right lateral incisors of mandible. Associated 9 mm odontogenic abscess within the overlying soft tissues. No extension of the abscess into the deep facial or cervical compartments. Electronically Signed   By: Kristine Garbe M.D.   On: 09/02/2018 17:32  .   ASSESSMENT: Improving cellulitis versus early abscess from dental origin. She clearly has a chronic periapical abscess of the central lower incisors  PLAN: Main intervention other than antibiotics is for her to have the teeth extracted as soon as possible. They can take a copy of the CT scan to her dentist and this would likely suffice for their needs in terms of planning extractions. Would continue IV Clindamycin and sounds like plan is to discharge tomorrow on PO Clinda, which is quite reasonable. Will sign off. No need for ENT  follow-up, she mainly needs dental follow-up.   Beverly Nearing, MD 09/03/2018 12:02 PM

## 2018-09-03 NOTE — Progress Notes (Signed)
[facility] - [place] [facility]   PATIENT NAME: Beverly Barrett    MR#:  [id number]  DATE OF BIRTH:  [birth year]  SUBJECTIVE:  CHIEF COMPLAINT:   Chief Complaint  Patient presents with  . Facial Swelling  . Dental Pain   Jaw pain improving.  Able to swallow well.  REVIEW OF SYSTEMS:    Review of Systems  Constitutional: Positive for malaise/fatigue. Negative for chills and fever.  HENT: Negative for sore throat.   Eyes: Negative for blurred vision, double vision and pain.  Respiratory: Negative for cough, hemoptysis, shortness of breath and wheezing.   Cardiovascular: Negative for chest pain, palpitations, orthopnea and leg swelling.  Gastrointestinal: Negative for abdominal pain, constipation, diarrhea, heartburn, nausea and vomiting.  Genitourinary: Negative for dysuria and hematuria.  Musculoskeletal: Negative for back pain and joint pain.  Skin: Negative for rash.  Neurological: Negative for sensory change, speech change, focal weakness and headaches.  Endo/Heme/Allergies: Does not bruise/bleed easily.  Psychiatric/Behavioral: Negative for depression. The patient is not nervous/anxious.     DRUG ALLERGIES:   Allergies  Allergen Reactions  . Latex Swelling    Pt unsure of reaction pt states something happened while in the hospital after surgery.    VITALS:  Blood pressure 108/71, pulse 90, temperature 98.2 F (36.8 C), temperature source Oral, resp. rate 16, height 5\' 6"  (1.676 m), weight 116.5 kg, SpO2 97 %.  PHYSICAL EXAMINATION:   Physical Exam  GENERAL:  58 y.o.-year-old patient lying in the bed with no acute distress.  EYES: Pupils equal, round, reactive to light and accommodation. No scleral icterus. Extraocular muscles intact.  HEENT: Head atraumatic, normocephalic. Oropharynx and nasopharynx clear.  NECK:  Supple, no jugular venous distention. No thyroid enlargement, no tenderness.  LUNGS: Normal breath sounds bilaterally, no  wheezing, rales, rhonchi. No use of accessory muscles of respiration.  CARDIOVASCULAR: S1, S2 normal. No murmurs, rubs, or gallops.  ABDOMEN: Soft, nontender, nondistended. Bowel sounds present. No organomegaly or mass.  EXTREMITIES: No cyanosis, clubbing or edema b/l.    NEUROLOGIC: Cranial nerves II through XII are intact. No focal Motor or sensory deficits b/l.   PSYCHIATRIC: The patient is alert and oriented x 3.  SKIN: No obvious rash, lesion, or ulcer.   LABORATORY PANEL:   CBC Recent Labs  Lab 09/03/18 0257  WBC 15.1*  HGB 11.6*  HCT 35.3*  PLT 299   ------------------------------------------------------------------------------------------------------------------ Chemistries  Recent Labs  Lab 09/03/18 0257  NA 136  K 3.9  CL 102  CO2 24  GLUCOSE 209*  BUN 14  CREATININE 0.79  CALCIUM 9.2   ------------------------------------------------------------------------------------------------------------------  Cardiac Enzymes No results for input(s): TROPONINI in the last 168 hours. ------------------------------------------------------------------------------------------------------------------  RADIOLOGY:  Ct Soft Tissue Neck W Contrast  Result Date: 09/02/2018 CLINICAL DATA:  58 y/o F; right submandibular swelling, toothache, dysphagia, shortness of breath for 1 month. Evaluate for abscess. EXAM: CT NECK WITH CONTRAST TECHNIQUE: Multidetector CT imaging of the neck was performed using the standard protocol following the bolus administration of intravenous contrast. CONTRAST:  75mL ISOVUE-300 IOPAMIDOL (ISOVUE-300) INJECTION 61% COMPARISON:  None. FINDINGS: Pharynx and larynx: Normal. No mass or swelling. Salivary glands: No inflammation, mass, or stone. Thyroid: Normal. Lymph nodes: None enlarged or abnormal density. Vascular: Negative. Limited intracranial: Negative. Visualized orbits: Negative. Mastoids and visualized paranasal sinuses: Clear. Skeleton: No acute or  aggressive process. Incidental small foci of condensing osteitis/idiopathic osteosclerosis surrounding the posterior-most mandibular molars. Mild spondylosis of the cervical spine with small endplate  marginal osteophytes. No significant bony foraminal or spinal canal stenosis. Upper chest: Negative. Other: Periapical cyst involving the central and right lateral incisors of the mandible. 9 mm odontogenic abscess within the soft tissues overlying the mandibular periapical cyst. IMPRESSION: Periapical cyst involving both central and the right lateral incisors of mandible. Associated 9 mm odontogenic abscess within the overlying soft tissues. No extension of the abscess into the deep facial or cervical compartments. Electronically Signed   By: Mitzi Hansen M.D.   On: 09/02/2018 17:32     ASSESSMENT AND PLAN:   *Dental abscess Continue IV clindamycin for 1 more day.  Appreciate ENT input.  Will need dentist follow-up as outpatient. Afebrile.  WBC improving  *Hypertension Continue clonidine and metoprolol.  *Hypothyroidism Continue levothyroxine.  *Hyperlipidemia Continue atorvastatin.  *Depression Continue Cymbalta and Seroquel.  *Sleep apnea She can use her home CPAP at night here.   *Diabetes Home medications.  Sliding scale insulin.  All the records are reviewed and case discussed with Care Management/Social Workerr. Management plans discussed with the patient, family and they are in agreement.  CODE STATUS: Full code  DVT Prophylaxis: SCDs  TOTAL TIME TAKING CARE OF THIS PATIENT: 35 minutes.   POSSIBLE D/C IN 1-2 DAYS, DEPENDING ON CLINICAL CONDITION.  Molinda Bailiff Itzayana Pardy M.D on 09/03/2018 at 1:02 PM  Between 7am to 6pm - Pager - [phone]  After 6pm go to [email or web address] - password [email or web address] [facility]  [facility]  Office  [phone]  CC: Primary care physician; Alba Cory, MD  Note: This dictation was prepared with Dragon dictation along  with smaller phrase technology. Any transcriptional errors that result from this process are unintentional.

## 2018-09-03 NOTE — Progress Notes (Signed)
Pt states that she takes Cybalta 20 mg three times and day along with Seroguel 25 mg at bedtime. Primary nurse paged and spoke to Dr. Jannifer Franklin. MD to change orders. Primary nurse to continue to monitor.

## 2018-09-04 ENCOUNTER — Telehealth: Payer: Self-pay

## 2018-09-04 LAB — GLUCOSE, CAPILLARY: Glucose-Capillary: 120 mg/dL — ABNORMAL HIGH (ref 70–99)

## 2018-09-04 LAB — HIV ANTIBODY (ROUTINE TESTING W REFLEX): HIV Screen 4th Generation wRfx: NONREACTIVE

## 2018-09-04 MED ORDER — CLINDAMYCIN HCL 300 MG PO CAPS: 300.0000 mg | ORAL_CAPSULE | Freq: Three times a day (TID) | ORAL | 0 refills | Status: AC

## 2018-09-04 MED ORDER — OXYCODONE-ACETAMINOPHEN 5-325 MG PO TABS: 1.0000 | ORAL_TABLET | Freq: Four times a day (QID) | ORAL | 0 refills | Status: AC | PRN

## 2018-09-04 NOTE — Discharge Instructions (Signed)
Resume diet and activity as before  Follow up with your oral surgeon at the earliest available appointment within 3-4 days

## 2018-09-04 NOTE — Telephone Encounter (Signed)
Transition Care Management Follow-up Telephone Call  Date of discharge and from where: [facility] on 09/04/18.  How have you been since you were released from the hospital? Doing alright, slightly dizzy but has been taken it easy and laying around. Pt states her B/P was running low pre hospital d/c. Pt was advised it could be caused by the antibiotic. Pt has some soreness and swelling in the jaw area. Declines bleeding, fever or n/v/d.   Any questions or concerns? No   Items Reviewed:  Did the pt receive and understand the discharge instructions provided? Yes   Medications obtained and verified? Pt verified new medications and stated she will review the rest at her HFU apt.  Any new allergies since your discharge? No   Dietary orders reviewed? N/A  Do you have support at home? Yes   Other (ie: DME, Home Health, etc) N/A  Functional Questionnaire: (I = Independent and D = Dependent)  Bathing/Dressing- I   Meal Prep- I  Eating- I  Maintaining continence- I  Transferring/Ambulation- I, uses a walker when dizzy.  Managing Meds- I   Follow up appointments reviewed:    PCP Hospital f/u appt confirmed? No , pt awaiting a call from the office to set up apt. See previous TE.  [facility] f/u appt confirmed? N/A  Are transportation arrangements needed? No   If their condition worsens, is the pt aware to call  their PCP or go to the ED? Yes  Was the patient provided with contact information for the PCP's office or ED? Yes  Was the pt encouraged to call back with questions or concerns? Yes

## 2018-09-04 NOTE — Telephone Encounter (Signed)
Copied from [place] [phone]. Topic: Appointment Scheduling - Scheduling Inquiry for Clinic >> Sep 04, 2018  9:58 AM Rayann Heman wrote: Reason for CRM: pt needed a hospital follow up with in one week. Could patient be worked in. Please advise CB#[phone](daughter cell)

## 2018-09-04 NOTE — Progress Notes (Signed)
Beverly Barrett  A and O x 4 VSS. Pt tolerating diet well. No complaints of pain or nausea. IV removed intact, prescriptions given. Pt voices understanding of discharge instructions with no further questions. Pt discharged via wheelchair with RN.   Allergies as of 09/04/2018      Reactions   Latex Swelling   Pt unsure of reaction pt states something happened while in the hospital after surgery.      Medication List    TAKE these medications   albuterol 108 (90 Base) MCG/ACT inhaler Commonly known as:  VENTOLIN HFA Inhale 2 puffs into the lungs every 6 (six) hours as needed for wheezing or shortness of breath.   aspirin 81 MG EC tablet Take 81 mg by mouth daily.   atorvastatin 40 MG tablet Commonly known as:  LIPITOR TAKE 1 TABLET BY MOUTH AT BEDTIME. GENERIC EQUIVALENT FOR LIPITOR   clindamycin 300 MG capsule Commonly known as:  CLEOCIN Take 1 capsule (300 mg total) by mouth 3 (three) times daily for 7 days.   cloNIDine 0.1 MG tablet Commonly known as:  CATAPRES Take 1 tablet (0.1 mg total) by mouth 2 (two) times daily.   conjugated estrogens vaginal cream Commonly known as:  PREMARIN Place 1 Applicatorful vaginally daily. Apply 0.5mg  (pea-sized amount)  just inside the vaginal introitus with a finger-tip every night for two weeks and then Monday, Wednesday and Friday nights.   cyclobenzaprine 5 MG tablet Commonly known as:  FLEXERIL Take 5 mg by mouth 3 (three) times daily as needed.   diclofenac 50 MG tablet Commonly known as:  CATAFLAM TAKE 1 TABLET BY MOUTH THREE TIMES DAILY AS NEEDED   DULoxetine 30 MG capsule Commonly known as:  CYMBALTA Take 1 capsule (30 mg total) by mouth 2 (two) times daily.   DULoxetine 20 MG capsule Commonly known as:  CYMBALTA Take 1 capsule (20 mg total) by mouth daily. To be combined with 60 mg to make it 80 mg daily   EQ ALLERGY RELIEF 10 MG tablet Generic drug:  loratadine TAKE ONE TABLET BY MOUTH ONCE DAILY   fluticasone 50  MCG/ACT nasal spray Commonly known as:  FLONASE Place 2 sprays into both nostrils daily.   gabapentin 300 MG capsule Commonly known as:  NEURONTIN One in the morning and 2 in the evening.   glucose blood test strip Use once daily.   hydrOXYzine 25 MG tablet Commonly known as:  ATARAX/VISTARIL Take 1 tablet (25 mg total) by mouth 3 (three) times daily as needed (for severe anxiety only).   levothyroxine 100 MCG tablet Commonly known as:  SYNTHROID, LEVOTHROID Take 1 tablet (100 mcg total) by mouth daily.   metoprolol succinate 50 MG 24 hr tablet Commonly known as:  TOPROL-XL TAKE 1 TABLET BY MOUTH DAILY   mometasone 220 MCG/INH inhaler Commonly known as:  ASMANEX (120 METERED DOSES) Inhale 2 puffs into the lungs daily.   montelukast 10 MG tablet Commonly known as:  SINGULAIR Take 1 tablet (10 mg total) by mouth at bedtime.   nitroGLYCERIN 0.4 MG SL tablet Commonly known as:  NITROSTAT Place 1 tablet (0.4 mg total) under the tongue every 5 (five) minutes as needed.   olmesartan-hydrochlorothiazide 40-25 MG tablet Commonly known as:  BENICAR HCT Take 1 tablet by mouth daily.   omeprazole 40 MG capsule Commonly known as:  PRILOSEC Take 1 capsule (40 mg total) by mouth daily.   oxyCODONE-acetaminophen 5-325 MG tablet Commonly known as:  PERCOCET/ROXICET Take 1 tablet by  mouth every 6 (six) hours as needed for up to 3 days for severe pain. Notes to patient:  Last dose given today at 9:50am   Plecanatide 3 MG Tabs Commonly known as:  TRULANCE Take 1 tablet by mouth daily. In place of linzess   QUEtiapine 50 MG tablet Commonly known as:  SEROQUEL Take 1-1.5 tablets (50-75 mg total) by mouth at bedtime.   rizatriptan 10 MG tablet Commonly known as:  MAXALT Take 1 tablet (10 mg total) by mouth 3 (three) times daily as needed for migraine.   Trospium Chloride 60 MG Cp24 Take 1 capsule (60 mg total) by mouth daily.       Vitals:   09/04/18 0837 09/04/18 0949   BP:  (!) 95/58  Pulse:  67  Resp:  20  Temp:  98.6 F (37 C)  SpO2: 91% 95%    Elly Modena

## 2018-09-04 NOTE — Telephone Encounter (Signed)
At 1 pm

## 2018-09-08 ENCOUNTER — Ambulatory Visit (INDEPENDENT_AMBULATORY_CARE_PROVIDER_SITE_OTHER): Payer: BLUE CROSS/BLUE SHIELD | Admitting: Family Medicine

## 2018-09-08 ENCOUNTER — Encounter: Payer: Self-pay | Admitting: Family Medicine

## 2018-09-08 VITALS — BP 104/70 | HR 78 | Temp 98.0°F | Resp 16 | Ht 61.75 in | Wt 261.9 lb

## 2018-09-08 DIAGNOSIS — Z09 Encounter for follow-up examination after completed treatment for conditions other than malignant neoplasm: Secondary | ICD-10-CM | POA: Diagnosis not present

## 2018-09-08 DIAGNOSIS — E034 Atrophy of thyroid (acquired): Secondary | ICD-10-CM

## 2018-09-08 DIAGNOSIS — L03211 Cellulitis of face: Secondary | ICD-10-CM | POA: Diagnosis not present

## 2018-09-08 DIAGNOSIS — E876 Hypokalemia: Secondary | ICD-10-CM

## 2018-09-08 DIAGNOSIS — K047 Periapical abscess without sinus: Secondary | ICD-10-CM | POA: Diagnosis not present

## 2018-09-08 DIAGNOSIS — D72829 Elevated white blood cell count, unspecified: Secondary | ICD-10-CM

## 2018-09-08 LAB — CBC WITH DIFFERENTIAL/PLATELET
Absolute Monocytes: 1105 cells/uL — ABNORMAL HIGH (ref 200–950)
Basophils Absolute: 102 cells/uL (ref 0–200)
Basophils Relative: 0.8 %
Eosinophils Absolute: 356 cells/uL (ref 15–500)
Eosinophils Relative: 2.8 %
HCT: 32.2 % — ABNORMAL LOW (ref 35.0–45.0)
HEMOGLOBIN: 10.8 g/dL — AB (ref 11.7–15.5)
Lymphs Abs: 3302 cells/uL (ref 850–3900)
MCH: 26.7 pg — ABNORMAL LOW (ref 27.0–33.0)
MCHC: 33.5 g/dL (ref 32.0–36.0)
MCV: 79.7 fL — ABNORMAL LOW (ref 80.0–100.0)
MONOS PCT: 8.7 %
MPV: 10 fL (ref 7.5–12.5)
Neutro Abs: 7836 cells/uL — ABNORMAL HIGH (ref 1500–7800)
Neutrophils Relative %: 61.7 %
Platelets: 427 10*3/uL — ABNORMAL HIGH (ref 140–400)
RBC: 4.04 10*6/uL (ref 3.80–5.10)
RDW: 15.8 % — ABNORMAL HIGH (ref 11.0–15.0)
Total Lymphocyte: 26 %
WBC: 12.7 10*3/uL — AB (ref 3.8–10.8)

## 2018-09-08 LAB — BASIC METABOLIC PANEL WITH GFR
BUN: 18 mg/dL (ref 7–25)
CALCIUM: 9.3 mg/dL (ref 8.6–10.4)
CO2: 30 mmol/L (ref 20–32)
Chloride: 101 mmol/L (ref 98–110)
Creat: 1.03 mg/dL (ref 0.50–1.05)
GFR, Est African American: 69 mL/min/{1.73_m2} (ref >=60)
GFR, Est Non African American: 59 mL/min/{1.73_m2} — ABNORMAL LOW (ref >=60)
Glucose, Bld: 132 mg/dL — ABNORMAL HIGH (ref 65–99)
Potassium: 3.7 mmol/L (ref 3.5–5.3)
Sodium: 139 mmol/L (ref 135–146)

## 2018-09-08 LAB — TSH: TSH: 7.94 mIU/L — ABNORMAL HIGH (ref 0.40–4.50)

## 2018-09-08 NOTE — Progress Notes (Signed)
Name: Beverly Barrett   MRN: [id number]    DOB: 1961/07/01   Date:09/08/2018       Progress Note  Subjective  Chief Complaint  Chief Complaint  Patient presents with  . Hospitalization Follow-up    dental abscess and facial cellulitis.    HPI  Patient is here today for hospital discharge follow up: she has a long history of tooth abscess but she states usually resolved by itself at home. However this episode of toothache started mid January and got progressively worse. Pain on lower teeth, followed by mild fever, difficulty swallowing, facial swelling and her gum was red and very tender. She went to [facility] on 09/02/2018 and was admitted for evaluation and treatment. She had Ct soft tissue neck and showed dental abscess, facial cellulitis and dental infection. She was given IV Clindamycin and sent home two days later on pain medication that she is now only taking prn and oral clindamycin that she has been taking as prescribed. She states pain right now is 5/10, facial swelling and gum swelling has resolved, no longer having dysphagia. She is following a liquid diet to avoid pain. She has a follow up with [facility] in 2 days, she will have the abscess drained as recommended followed by teeth abstraction to avoid recurrence.   She has DM and states glucose at home prior to admission was going higher than usual in the 150's fasting but is back down now, this am it was 132. She denies polyphagia, polydipsia or polyuria.  Patient Active Problem List   Diagnosis Date Noted  . Dental abscess 09/02/2018  . Grade II internal hemorrhoids 05/16/2017  . Mixed stress and urge urinary incontinence 11/12/2016  . Degenerative arthritis of lumbar spine 11/12/2016  . Common migraine with intractable migraine 09/20/2016  . Iron deficiency anemia 09/12/2016  . GAD (generalized anxiety disorder) 12/20/2015  . OSA on CPAP 12/20/2015  . Angina effort 12/20/2015  . Hyperglycemia 09/22/2015  . History  of colonic polyps   . Benign neoplasm of sigmoid colon   . Frequency 04/14/2015  . Backache 04/14/2015  . Chronic pain of multiple joints 06/14/2014  . Dysmetabolic syndrome 01/60/1093  . Acid reflux 06/14/2014  . Extreme obesity 06/14/2014  . Arthritis, degenerative 06/14/2014  . Morbid obesity with BMI of 40.0-44.9, adult ([place]) 06/14/2014  . Hypothyroidism due to acquired atrophy of thyroid 08/18/2013  . Hypothyroidism, unspecified 08/18/2013  . Hypertension goal BP (blood pressure) < 140/90 12/07/2010  . Familial multiple lipoprotein-type hyperlipidemia 12/07/2010  . CN (constipation) 11/21/2009  . Allergic rhinitis 10/13/2008  . Asthma, mild intermittent, well-controlled 09/01/2008  . Major depressive disorder, recurrent episode, in partial remission with anxious distress ([place]) 08/13/2007    Past Surgical History:  Procedure Laterality Date  . ABDOMINAL HYSTERECTOMY    . bladder tach  1995  . CESAREAN SECTION    . COLONOSCOPY WITH PROPOFOL N/A 05/29/2015   Procedure: COLONOSCOPY WITH PROPOFOL;  Surgeon: Lucilla Lame, MD;  Location: [facility];  Service: Endoscopy;  Laterality: N/A;  Latex allergy sleep apnea - no CPAP machine (yet)  . ESOPHAGOGASTRODUODENOSCOPY N/A 04/23/2017   Procedure: ESOPHAGOGASTRODUODENOSCOPY (EGD);  Surgeon: Lin Landsman, MD;  Location: [facility];  Service: Gastroenterology;  Laterality: N/A;  Latex allergy sleep apnea  . POLYPECTOMY  05/29/2015   Procedure: POLYPECTOMY;  Surgeon: Lucilla Lame, MD;  Location: [facility];  Service: Endoscopy;;    Family History  Problem Relation Age of Onset  . Diabetes Sister   .  Anxiety disorder Sister   . Depression Sister   . Hypertension Sister   . Cirrhosis Mother   . Diabetes Mother   . Cirrhosis Father   . Breast cancer Sister 39  . Heart attack Brother   . Alcohol abuse Brother   . Drug abuse Brother   . Prostate cancer Neg Hx   . Kidney cancer Neg Hx   . Bladder  Cancer Neg Hx     Social History   Socioeconomic History  . Marital status: Married    Spouse name: clayton sr  . Number of children: 3  . Years of education: Not on file  . Highest education level: GED or equivalent  Occupational History  . Not on file  Social Needs  . Financial resource strain: Very hard  . Food insecurity:    Worry: Often true    Inability: Often true  . Transportation needs:    Medical: No    Non-medical: No  Tobacco Use  . Smoking status: Never Smoker  . Smokeless tobacco: Never Used  Substance and Sexual Activity  . Alcohol use: No    Alcohol/week: 0.0 standard drinks  . Drug use: No  . Sexual activity: Yes    Partners: Male  Lifestyle  . Physical activity:    Days per week: 2 days    Minutes per session: 30 min  . Stress: Very much  Relationships  . Social connections:    Talks on phone: Three times a week    Gets together: Once a week    Attends religious service: More than 4 times per year    Active member of club or organization: No    Attends meetings of clubs or organizations: Never    Relationship status: Married  . Intimate partner violence:    Fear of current or ex partner: No    Emotionally abused: No    Physically abused: No    Forced sexual activity: No  Other Topics Concern  . Not on file  Social History Narrative   Lives with husband and one daughter other kids are out of the house   She does not work, husband on disability      Current Outpatient Medications:  .  albuterol (VENTOLIN HFA) 108 (90 Base) MCG/ACT inhaler, Inhale 2 puffs into the lungs every 6 (six) hours as needed for wheezing or shortness of breath., Disp: 3 Inhaler, Rfl: 0 .  aspirin 81 MG EC tablet, Take 81 mg by mouth daily.  , Disp: , Rfl:  .  atorvastatin (LIPITOR) 40 MG tablet, TAKE 1 TABLET BY MOUTH AT BEDTIME. GENERIC EQUIVALENT FOR LIPITOR, Disp: 90 tablet, Rfl: 1 .  clindamycin (CLEOCIN) 300 MG capsule, Take 1 capsule (300 mg total) by mouth 3  (three) times daily for 7 days., Disp: 21 capsule, Rfl: 0 .  cloNIDine (CATAPRES) 0.1 MG tablet, Take 1 tablet (0.1 mg total) by mouth 2 (two) times daily., Disp: 180 tablet, Rfl: 1 .  conjugated estrogens (PREMARIN) vaginal cream, Place 1 Applicatorful vaginally daily. Apply 0.5mg  (pea-sized amount)  just inside the vaginal introitus with a finger-tip every night for two weeks and then Monday, Wednesday and Friday nights., Disp: 30 g, Rfl: 12 .  cyclobenzaprine (FLEXERIL) 5 MG tablet, Take 5 mg by mouth 3 (three) times daily as needed. , Disp: , Rfl: 0 .  diclofenac (CATAFLAM) 50 MG tablet, TAKE 1 TABLET BY MOUTH THREE TIMES DAILY AS NEEDED, Disp: 60 tablet, Rfl: 2 .  DULoxetine (CYMBALTA)  20 MG capsule, Take 1 capsule (20 mg total) by mouth daily. To be combined with 60 mg to make it 80 mg daily, Disp: 90 capsule, Rfl: 1 .  DULoxetine (CYMBALTA) 30 MG capsule, Take 1 capsule (30 mg total) by mouth 2 (two) times daily., Disp: 180 capsule, Rfl: 1 .  EQ ALLERGY RELIEF 10 MG tablet, TAKE ONE TABLET BY MOUTH ONCE DAILY, Disp: 90 tablet, Rfl: 1 .  fluticasone (FLONASE) 50 MCG/ACT nasal spray, Place 2 sprays into both nostrils daily. , Disp: , Rfl:  .  gabapentin (NEURONTIN) 300 MG capsule, One in the morning and 2 in the evening., Disp: 90 capsule, Rfl: 3 .  glucose blood test strip, Use once daily., Disp: 100 each, Rfl: 12 .  hydrOXYzine (ATARAX/VISTARIL) 25 MG tablet, Take 1 tablet (25 mg total) by mouth 3 (three) times daily as needed (for severe anxiety only)., Disp: 270 tablet, Rfl: 1 .  levothyroxine (SYNTHROID, LEVOTHROID) 100 MCG tablet, Take 1 tablet (100 mcg total) by mouth daily., Disp: 90 tablet, Rfl: 0 .  metoprolol succinate (TOPROL-XL) 50 MG 24 hr tablet, TAKE 1 TABLET BY MOUTH DAILY, Disp: 90 tablet, Rfl: 1 .  mometasone (ASMANEX, 120 METERED DOSES,) 220 MCG/INH inhaler, Inhale 2 puffs into the lungs daily., Disp: 3 Inhaler, Rfl: 1 .  montelukast (SINGULAIR) 10 MG tablet, Take 1 tablet (10  mg total) by mouth at bedtime., Disp: 90 tablet, Rfl: 1 .  nitroGLYCERIN (NITROSTAT) 0.4 MG SL tablet, Place 1 tablet (0.4 mg total) under the tongue every 5 (five) minutes as needed., Disp: 25 tablet, Rfl: 0 .  olmesartan-hydrochlorothiazide (BENICAR HCT) 40-25 MG tablet, Take 1 tablet by mouth daily., Disp: 90 tablet, Rfl: 1 .  omeprazole (PRILOSEC) 40 MG capsule, Take 1 capsule (40 mg total) by mouth daily., Disp: 90 capsule, Rfl: 1 .  Plecanatide (TRULANCE) 3 MG TABS, Take 1 tablet by mouth daily. In place of linzess, Disp: 90 tablet, Rfl: 1 .  QUEtiapine (SEROQUEL) 50 MG tablet, Take 1-1.5 tablets (50-75 mg total) by mouth at bedtime., Disp: 135 tablet, Rfl: 0 .  rizatriptan (MAXALT) 10 MG tablet, Take 1 tablet (10 mg total) by mouth 3 (three) times daily as needed for migraine., Disp: 10 tablet, Rfl: 5 .  Trospium Chloride 60 MG CP24, Take 1 capsule (60 mg total) by mouth daily., Disp: 90 each, Rfl: 3  Allergies  Allergen Reactions  . Latex Swelling    Pt unsure of reaction pt states something happened while in the hospital after surgery.    I personally reviewed active problem list, medication list, allergies, family history, social history with the patient/caregiver today.   ROS  Ten systems reviewed and is negative except as mentioned in HPI  She also has intermittent headaches   Objective  Vitals:   09/08/18 0801  BP: 104/70  Pulse: 78  Resp: 16  Temp: 98 F (36.7 C)  TempSrc: Oral  SpO2: 94%  Weight: 261 lb 14.4 oz (118.8 kg)  Height: 5' 1.75" (1.568 m)    Body mass index is 48.29 kg/m.  Physical Exam  Constitutional: Patient appears well-developed and well-nourished. Obese  No distress.  HEENT: head atraumatic, normocephalic, pupils equal and reactive to light,  neck supple, throat within normal limits, tender during palpation of lower teeth, but no erythema or significant swelling, no facial swelling or erythema Cardiovascular: Normal rate, regular rhythm  and normal heart sounds.  No murmur heard. No BLE edema. Pulmonary/Chest: Effort normal and breath sounds normal.  No respiratory distress. Abdominal: Soft.  There is no tenderness. Psychiatric: Patient has a normal mood and affect. behavior is normal. Judgment and thought content normal.  Recent Results (from the past 2160 hour(s))  POCT HgB A1C     Status: Abnormal   Collection Time: 08/03/18 10:52 AM  Result Value Ref Range   Hemoglobin A1C 6.3 (A) 4.0 - 5.6 %   HbA1c POC (<> result, manual entry) 6.3 4.0 - 5.6 %   HbA1c, POC (prediabetic range) 6.3 5.7 - 6.4 %   HbA1c, POC (controlled diabetic range) 6.3 0.0 - 7.0 %  CBC with Differential/Platelet     Status: Abnormal   Collection Time: 08/03/18 11:29 AM  Result Value Ref Range   WBC 9.9 3.8 - 10.8 Thousand/uL   RBC 4.11 3.80 - 5.10 Million/uL   Hemoglobin 11.1 (L) 11.7 - 15.5 g/dL   HCT 33.1 (L) 35.0 - 45.0 %   MCV 80.5 80.0 - 100.0 fL   MCH 27.0 27.0 - 33.0 pg   MCHC 33.5 32.0 - 36.0 g/dL   RDW 15.0 11.0 - 15.0 %   Platelets 288 140 - 400 Thousand/uL   MPV 10.8 7.5 - 12.5 fL   Neutro Abs 5,920 1,500 - 7,800 cells/uL   Lymphs Abs 2,633 850 - 3,900 cells/uL   Absolute Monocytes 891 200 - 950 cells/uL   Eosinophils Absolute 366 15 - 500 cells/uL   Basophils Absolute 89 0 - 200 cells/uL   Neutrophils Relative % 59.8 %   Total Lymphocyte 26.6 %   Monocytes Relative 9.0 %   Eosinophils Relative 3.7 %   Basophils Relative 0.9 %  TSH     Status: Abnormal   Collection Time: 08/03/18 11:29 AM  Result Value Ref Range   TSH 7.45 (H) 0.40 - 4.50 mIU/L  Iron, TIBC and Ferritin Panel     Status: Abnormal   Collection Time: 08/03/18 11:29 AM  Result Value Ref Range   Iron 97 45 - 160 mcg/dL   TIBC 451 (H) 250 - 450 mcg/dL (calc)   %SAT 22 16 - 45 % (calc)   Ferritin 14 (L) 16 - 232 ng/mL  TEST AUTHORIZATION     Status: None   Collection Time: 08/03/18 11:29 AM  Result Value Ref Range   TEST NAME: IRON, TIBC AND FERRITIN PANEL     TEST CODE: [id number]    CLIENT CONTACT: DR Ancil Boozer    REPORT ALWAYS MESSAGE SIGNATURE      Comment: . The laboratory testing on this patient was verbally requested or confirmed by the ordering physician or his or her authorized representative after contact with an employee of [employer]. Federal regulations require that we maintain on file written authorization for all laboratory testing.  Accordingly we are asking that the ordering physician or his or her authorized representative sign a copy of this report and promptly return it to the client service representative. . . Signature:____________________________________________________ . Please fax this signed page to [phone] or return it via your [employer] courier.   CBC     Status: Abnormal   Collection Time: 09/02/18  4:23 PM  Result Value Ref Range   WBC 17.0 (H) 4.0 - 10.5 K/uL   RBC 4.47 3.87 - 5.11 MIL/uL   Hemoglobin 12.0 12.0 - 15.0 g/dL   HCT 36.8 36.0 - 46.0 %   MCV 82.3 80.0 - 100.0 fL   MCH 26.8 26.0 - 34.0 pg   MCHC 32.6 30.0 - 36.0 g/dL  RDW 15.8 (H) 11.5 - 15.5 %   Platelets 300 150 - 400 K/uL   nRBC 0.0 0.0 - 0.2 %    Comment: Performed at [facility], [address] [place], [place] [address]  Basic metabolic panel     Status: Abnormal   Collection Time: 09/02/18  4:23 PM  Result Value Ref Range   Sodium 136 135 - 145 mmol/L   Potassium 3.2 (L) 3.5 - 5.1 mmol/L   Chloride 100 98 - 111 mmol/L   CO2 26 22 - 32 mmol/L   Glucose, Bld 162 (H) 70 - 99 mg/dL   BUN 13 6 - 20 mg/dL   Creatinine, Ser 0.80 0.44 - 1.00 mg/dL   Calcium 9.0 8.9 - 10.3 mg/dL   GFR calc non Af Amer >60 >60 mL/min   GFR calc Af Amer >60 >60 mL/min   Anion gap 10 5 - 15    Comment: Performed at [facility], [address] [place], [place] [address]  Glucose, capillary     Status: Abnormal   Collection Time: 09/02/18 10:51 PM  Result Value Ref Range   Glucose-Capillary 219 (H) 70 - 99 mg/dL    Comment 1 Notify RN   HIV antibody (Routine Testing)     Status: None   Collection Time: 09/03/18  2:57 AM  Result Value Ref Range   HIV Screen 4th Generation wRfx Non Reactive Non Reactive    Comment: (NOTE) Performed At: [facility] [place], [place] [id number] Rush Farmer MD [id number]   Basic metabolic panel     Status: Abnormal   Collection Time: 09/03/18  2:57 AM  Result Value Ref Range   Sodium 136 135 - 145 mmol/L   Potassium 3.9 3.5 - 5.1 mmol/L   Chloride 102 98 - 111 mmol/L   CO2 24 22 - 32 mmol/L   Glucose, Bld 209 (H) 70 - 99 mg/dL   BUN 14 6 - 20 mg/dL   Creatinine, Ser 0.79 0.44 - 1.00 mg/dL   Calcium 9.2 8.9 - 10.3 mg/dL   GFR calc non Af Amer >60 >60 mL/min   GFR calc Af Amer >60 >60 mL/min   Anion gap 10 5 - 15    Comment: Performed at [facility], [address] [place], [place] [address]  CBC     Status: Abnormal   Collection Time: 09/03/18  2:57 AM  Result Value Ref Range   WBC 15.1 (H) 4.0 - 10.5 K/uL   RBC 4.37 3.87 - 5.11 MIL/uL   Hemoglobin 11.6 (L) 12.0 - 15.0 g/dL   HCT 35.3 (L) 36.0 - 46.0 %   MCV 80.8 80.0 - 100.0 fL   MCH 26.5 26.0 - 34.0 pg   MCHC 32.9 30.0 - 36.0 g/dL   RDW 15.9 (H) 11.5 - 15.5 %   Platelets 299 150 - 400 K/uL   nRBC 0.0 0.0 - 0.2 %    Comment: Performed at [facility], [address] [place], [place] [address]  Glucose, capillary     Status: Abnormal   Collection Time: 09/03/18  7:56 AM  Result Value Ref Range   Glucose-Capillary 177 (H) 70 - 99 mg/dL  Glucose, capillary     Status: Abnormal   Collection Time: 09/03/18 11:23 AM  Result Value Ref Range   Glucose-Capillary 197 (H) 70 - 99 mg/dL  Glucose, capillary     Status: Abnormal   Collection Time: 09/03/18  4:23 PM  Result Value Ref Range   Glucose-Capillary 131 (H) 70 - 99 mg/dL  Glucose, capillary     Status: Abnormal   Collection Time: 09/03/18  9:51 PM  Result Value Ref Range   Glucose-Capillary 153  (H) 70 - 99 mg/dL  Glucose, capillary     Status: Abnormal   Collection Time: 09/04/18  7:31 AM  Result Value Ref Range   Glucose-Capillary 120 (H) 70 - 99 mg/dL      PHQ2/9: Depression screen [facility] 2/9 08/03/2018 04/14/2018 03/19/2018 12/17/2017 08/18/2017  Decreased Interest 2 2 2  0 2  Down, Depressed, Hopeless 2 2 2 1 3   PHQ - 2 Score 4 4 4 1 5   Altered sleeping 1 1 1 1 3   Tired, decreased energy 2 2 2  0 3  Change in appetite 2 2 2 1 2   Feeling bad or failure about yourself  1 2 2 1 2   Trouble concentrating 0 0 2 2 2   Moving slowly or fidgety/restless 1 3 1 2 2   Suicidal thoughts 0 0 0 2 2  PHQ-9 Score 11 14 14 10 21   Difficult doing work/chores Very difficult - Somewhat difficult Very difficult Very difficult  Some recent data might be hidden     Fall Risk: Fall Risk  09/08/2018 08/03/2018 05/28/2018 05/21/2018 05/14/2018  Falls in the past year? 0 0 No No No  Number falls in past yr: 0 0 - - -  Injury with Fall? 0 0 - - -  Comment - - - - -  Follow up - - - - -     Assessment & Plan  1. Tooth abscess  - CBC with Differential/Platelet  2. Facial cellulitis  - CBC with Differential/Platelet  3. Hospital discharge follow-up  - TSH - CBC with Differential/Platelet - BASIC METABOLIC PANEL WITH GFR  4. Hypothyroidism due to acquired atrophy of thyroid  - TSH  5. Hypokalemia  - BASIC METABOLIC PANEL WITH GFR  6. Leukocytosis, unspecified type  - CBC with Differential/Platelet

## 2018-09-10 ENCOUNTER — Other Ambulatory Visit: Payer: Self-pay | Admitting: Family Medicine

## 2018-09-10 MED ORDER — LEVOTHYROXINE SODIUM 112 MCG PO TABS: 112.0000 ug | ORAL_TABLET | Freq: Every day | ORAL | 0 refills | Status: DC

## 2018-09-11 ENCOUNTER — Telehealth: Payer: Self-pay | Admitting: Licensed Clinical Social Worker

## 2018-09-11 NOTE — Telephone Encounter (Signed)
CSW attempted to reach patient today regarding an EMMI response she gave. There was no answer. A 2nd attempt will be made. Shela Leff MSW,LCSW [phone]

## 2018-09-14 NOTE — Discharge Summary (Signed)
[facility] [place] [facility]   PATIENT NAME: Beverly Barrett    MR#:  [id number]  DATE OF BIRTH:  Mar 31, 1961  DATE OF ADMISSION:  09/02/2018 ADMITTING PHYSICIAN: Altamese Dilling, MD  DATE OF DISCHARGE: 09/04/2018 11:25 AM  PRIMARY CARE PHYSICIAN: Alba Cory, MD   ADMISSION DIAGNOSIS:  Dental abscess [K04.7] Facial cellulitis [L03.211]  DISCHARGE DIAGNOSIS:  Principal Problem:   Dental abscess   SECONDARY DIAGNOSIS:   Past Medical History:  Diagnosis Date  . Anginal pain (HCC)    none in approx 2 yrs  . Anxiety   . Asthma   . Chronic lower back pain   . Common migraine with intractable migraine 09/20/2016  . Depression    suicidal ideation  . Diabetes mellitus without complication (HCC)   . GERD (gastroesophageal reflux disease)   . Headache    migraines -3x/wk  . Hyperlipidemia   . Hypertension   . Hypothyroidism   . Motion sickness    cars  . Multilevel degenerative disc disease   . Shortness of breath dyspnea   . Sleep apnea    CPAP  . Vertigo   . Wears dentures    partial upper     ADMITTING HISTORY  HISTORY OF PRESENT ILLNESS: Beverly Barrett  is a 58 y.o. female with a known history of anginal chest pain, asthma, chronic lower back pain, migraine, depression, diabetes without complication, gastroesophageal reflux disease, hyperlipidemia, hypertension, hypothyroidism, sleep apnea and CPAP use-was having tooth pain in lower jaw for more than last 1 week.  She also had some chills.  She went to her general dentist office 3 days ago and they gave her some Keflex and referred her to oral surgeon.  She went there but they suggested she would need a 3D x-ray of her tooth and they do not have that and gave her some other referral. Meanwhile she continued to have significant pain in her mouth so decided to come to emergency room.  She also had some swelling on her mandible. CT scan in ER showed a small 9 mm abscess near the tooth,  ER physician spoke to ENT on-call Dr. Elissa Lovett suggested for an abscess the small size no drainage needed but to give IV antibiotics.  HOSPITAL COURSE:   *Dental abscess Admitted to medical floor.  Started on IV clindamycin.  Improved well.  Seen by ENT.  No intervention needed. Afebrile.  WBC improving Follow-up with oral surgeon.  Disc with her CT scan given.  *Hypertension Continue clonidine and metoprolol.  *Hypothyroidism Continue levothyroxine.  *Hyperlipidemia Continue atorvastatin.  *Depression Continue Cymbalta and Seroquel.  *Sleep apnea She can use her home CPAP at night here.  *Diabetes Home medications.  Sliding scale insulin.  Patient discharged home in stable condition with prescription for clindamycin to follow-up with oral surgery.  CONSULTS OBTAINED:  Treatment Team:  Geanie Logan, MD  DRUG ALLERGIES:   Allergies  Allergen Reactions  . Latex Swelling    Pt unsure of reaction pt states something happened while in the hospital after surgery.    DISCHARGE MEDICATIONS:   Allergies as of 09/04/2018      Reactions   Latex Swelling   Pt unsure of reaction pt states something happened while in the hospital after surgery.      Medication List    TAKE these medications   albuterol 108 (90 Base) MCG/ACT inhaler Commonly known as:  VENTOLIN HFA Inhale 2 puffs into the lungs every 6 (six) hours as needed  for wheezing or shortness of breath.   aspirin 81 MG EC tablet Take 81 mg by mouth daily.   atorvastatin 40 MG tablet Commonly known as:  LIPITOR TAKE 1 TABLET BY MOUTH AT BEDTIME. GENERIC EQUIVALENT FOR LIPITOR   cloNIDine 0.1 MG tablet Commonly known as:  CATAPRES Take 1 tablet (0.1 mg total) by mouth 2 (two) times daily.   conjugated estrogens vaginal cream Commonly known as:  PREMARIN Place 1 Applicatorful vaginally daily. Apply 0.5mg  (pea-sized amount)  just inside the vaginal introitus with a finger-tip every night for two weeks  and then Monday, Wednesday and Friday nights.   cyclobenzaprine 5 MG tablet Commonly known as:  FLEXERIL Take 5 mg by mouth 3 (three) times daily as needed.   diclofenac 50 MG tablet Commonly known as:  CATAFLAM TAKE 1 TABLET BY MOUTH THREE TIMES DAILY AS NEEDED   DULoxetine 30 MG capsule Commonly known as:  CYMBALTA Take 1 capsule (30 mg total) by mouth 2 (two) times daily.   DULoxetine 20 MG capsule Commonly known as:  CYMBALTA Take 1 capsule (20 mg total) by mouth daily. To be combined with 60 mg to make it 80 mg daily   EQ ALLERGY RELIEF 10 MG tablet Generic drug:  loratadine TAKE ONE TABLET BY MOUTH ONCE DAILY   fluticasone 50 MCG/ACT nasal spray Commonly known as:  FLONASE Place 2 sprays into both nostrils daily.   gabapentin 300 MG capsule Commonly known as:  NEURONTIN One in the morning and 2 in the evening.   glucose blood test strip Use once daily.   hydrOXYzine 25 MG tablet Commonly known as:  ATARAX/VISTARIL Take 1 tablet (25 mg total) by mouth 3 (three) times daily as needed (for severe anxiety only).   metoprolol succinate 50 MG 24 hr tablet Commonly known as:  TOPROL-XL TAKE 1 TABLET BY MOUTH DAILY   mometasone 220 MCG/INH inhaler Commonly known as:  ASMANEX (120 METERED DOSES) Inhale 2 puffs into the lungs daily.   montelukast 10 MG tablet Commonly known as:  SINGULAIR Take 1 tablet (10 mg total) by mouth at bedtime.   nitroGLYCERIN 0.4 MG SL tablet Commonly known as:  NITROSTAT Place 1 tablet (0.4 mg total) under the tongue every 5 (five) minutes as needed.   olmesartan-hydrochlorothiazide 40-25 MG tablet Commonly known as:  BENICAR HCT Take 1 tablet by mouth daily.   omeprazole 40 MG capsule Commonly known as:  PRILOSEC Take 1 capsule (40 mg total) by mouth daily.   Plecanatide 3 MG Tabs Commonly known as:  TRULANCE Take 1 tablet by mouth daily. In place of linzess   QUEtiapine 50 MG tablet Commonly known as:  SEROQUEL Take 1-1.5  tablets (50-75 mg total) by mouth at bedtime.   rizatriptan 10 MG tablet Commonly known as:  MAXALT Take 1 tablet (10 mg total) by mouth 3 (three) times daily as needed for migraine.   Trospium Chloride 60 MG Cp24 Take 1 capsule (60 mg total) by mouth daily.     ASK your doctor about these medications   clindamycin 300 MG capsule Commonly known as:  CLEOCIN Take 1 capsule (300 mg total) by mouth 3 (three) times daily for 7 days. Ask about: Should I take this medication?   oxyCODONE-acetaminophen 5-325 MG tablet Commonly known as:  PERCOCET/ROXICET Take 1 tablet by mouth every 6 (six) hours as needed for up to 3 days for severe pain. Ask about: Should I take this medication?       Today  VITAL SIGNS:  Blood pressure (!) 95/58, pulse 67, temperature 98.6 F (37 C), temperature source Oral, resp. rate 20, height 5\' 6"  (1.676 m), weight 116.5 kg, SpO2 95 %.  I/O:  No intake or output data in the 24 hours ending 09/14/18 1316  PHYSICAL EXAMINATION:  Physical Exam  GENERAL:  58 y.o.-year-old patient lying in the bed with no acute distress.  LUNGS: Normal breath sounds bilaterally, no wheezing, rales,rhonchi or crepitation. No use of accessory muscles of respiration.  CARDIOVASCULAR: S1, S2 normal. No murmurs, rubs, or gallops.  ABDOMEN: Soft, non-tender, non-distended. Bowel sounds present. No organomegaly or mass.  NEUROLOGIC: Moves all 4 extremities. PSYCHIATRIC: The patient is alert and oriented x 3.  SKIN: No obvious rash, lesion, or ulcer.   DATA REVIEW:   CBC Recent Labs  Lab 09/08/18 0841  WBC 12.7*  HGB 10.8*  HCT 32.2*  PLT 427*    Chemistries  Recent Labs  Lab 09/08/18 0841  NA 139  K 3.7  CL 101  CO2 30  GLUCOSE 132*  BUN 18  CREATININE 1.03  CALCIUM 9.3    Cardiac Enzymes No results for input(s): TROPONINI in the last 168 hours.  Microbiology Results  Results for orders placed or performed in visit on 10/23/16  Microscopic Examination      Status: Abnormal   Collection Time: 10/23/16  3:32 PM  Result Value Ref Range Status   WBC, UA 11-30 (A) 0 - 5 /hpf Final   RBC, UA 0-2 0 - 2 /hpf Final   Epithelial Cells (non renal) >10 (H) 0 - 10 /hpf Final   Mucus, UA Present (A) Not Estab. Final   Bacteria, UA Many (A) None seen/Few Final  CULTURE, URINE COMPREHENSIVE     Status: None   Collection Time: 10/23/16  3:51 PM  Result Value Ref Range Status   Urine Culture, Comprehensive Final report  Final   Organism ID, Bacteria Lactobacillus species  Final    Comment: Greater than 100,000 colony forming units per mL Susceptibility not normally performed on this organism.    Organism ID, Bacteria Comment  Final    Comment: Mixed urogenital flora 4,000 Colonies/mL     RADIOLOGY:  No results found.  Follow up with PCP in 1 week.  Management plans discussed with the patient, family and they are in agreement.  CODE STATUS:  Code Status History    Date Active Date Inactive Code Status Order ID Comments User Context   09/02/2018 2242 09/04/2018 1430 Full Code [id number]  Altamese Dilling, MD Inpatient      TOTAL TIME TAKING CARE OF THIS PATIENT ON DAY OF DISCHARGE: more than 30 minutes.   Molinda Bailiff Riki Gehring M.D on 09/14/2018 at 1:16 PM  Between 7am to 6pm - Pager - [phone]  After 6pm go to [email or web address] - password [email or web address] [facility]  [facility]  Office  [phone]  CC: Primary care physician; Alba Cory, MD  Note: This dictation was prepared with Dragon dictation along with smaller phrase technology. Any transcriptional errors that result from this process are unintentional.

## 2018-09-29 ENCOUNTER — Other Ambulatory Visit: Payer: Self-pay | Admitting: Family Medicine

## 2018-09-29 DIAGNOSIS — R232 Flushing: Secondary | ICD-10-CM

## 2018-10-08 ENCOUNTER — Ambulatory Visit: Payer: BLUE CROSS/BLUE SHIELD | Admitting: Psychiatry
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
## 2018-10-21 ENCOUNTER — Other Ambulatory Visit: Payer: Self-pay | Admitting: Neurology

## 2018-10-25 ENCOUNTER — Other Ambulatory Visit: Payer: Self-pay | Admitting: Family Medicine

## 2018-10-25 DIAGNOSIS — E785 Hyperlipidemia, unspecified: Secondary | ICD-10-CM

## 2018-10-25 DIAGNOSIS — I10 Essential (primary) hypertension: Secondary | ICD-10-CM

## 2018-10-26 ENCOUNTER — Other Ambulatory Visit: Payer: Self-pay | Admitting: Family Medicine

## 2018-10-26 ENCOUNTER — Other Ambulatory Visit: Payer: Self-pay

## 2018-10-26 DIAGNOSIS — D508 Other iron deficiency anemias: Secondary | ICD-10-CM

## 2018-10-26 DIAGNOSIS — E034 Atrophy of thyroid (acquired): Secondary | ICD-10-CM

## 2018-10-26 DIAGNOSIS — E785 Hyperlipidemia, unspecified: Secondary | ICD-10-CM

## 2018-10-26 DIAGNOSIS — I10 Essential (primary) hypertension: Secondary | ICD-10-CM

## 2018-10-26 NOTE — Progress Notes (Unsigned)
sh

## 2018-10-27 LAB — IRON,TIBC AND FERRITIN PANEL
%SAT: 9 % (calc) — ABNORMAL LOW (ref 16–45)
Ferritin: 14 ng/mL — ABNORMAL LOW (ref 16–232)
Iron: 38 ug/dL — ABNORMAL LOW (ref 45–160)
TIBC: 404 mcg/dL (calc) (ref 250–450)

## 2018-10-27 LAB — TSH: TSH: 2.74 mIU/L (ref 0.40–4.50)

## 2018-11-23 ENCOUNTER — Other Ambulatory Visit: Payer: Self-pay | Admitting: Neurology

## 2018-11-25 ENCOUNTER — Encounter: Payer: Self-pay | Admitting: Family Medicine

## 2018-11-26 ENCOUNTER — Encounter: Payer: Self-pay | Admitting: Family Medicine

## 2018-11-27 ENCOUNTER — Other Ambulatory Visit: Payer: Self-pay

## 2018-11-27 ENCOUNTER — Ambulatory Visit (INDEPENDENT_AMBULATORY_CARE_PROVIDER_SITE_OTHER): Payer: BLUE CROSS/BLUE SHIELD | Admitting: Family Medicine

## 2018-11-27 ENCOUNTER — Encounter: Payer: Self-pay | Admitting: Family Medicine

## 2018-11-27 VITALS — BP 152/95

## 2018-11-27 DIAGNOSIS — R300 Dysuria: Secondary | ICD-10-CM

## 2018-11-27 MED ORDER — CIPROFLOXACIN HCL 250 MG PO TABS: 250.0000 mg | ORAL_TABLET | Freq: Two times a day (BID) | ORAL | 0 refills | Status: DC

## 2018-11-27 NOTE — Progress Notes (Signed)
Name: Beverly Barrett   MRN: [id number]    DOB: April 03, 1961   Date:11/27/2018       Progress Note  Subjective  Chief Complaint  Chief Complaint  Patient presents with  . Urinary Tract Infection    I connected with  [name]  on 11/27/18 at  8:00 AM EDT by a video enabled telemedicine application and verified that I am speaking with the correct person using two identifiers.  I discussed the limitations of evaluation and management by telemedicine and the availability of in person appointments. The patient expressed understanding and agreed to proceed. Staff also discussed with the patient that there may be a patient responsible charge related to this service. Patient Location: at home Provider Location: [facility]   HPI  Dysuria: she states she has noticed symptoms of UTI over the past few days, she states she has dysuria, urinary frequency and some intermittent lower abdominal pain. No fever,chills or hematuria. but has noticed some nausea. Denies vomiting or change in appetite.  She has chronic back pain and is unchanged   Patient Active Problem List   Diagnosis Date Noted  . Dental abscess 09/02/2018  . Grade II internal hemorrhoids 05/16/2017  . Mixed stress and urge urinary incontinence 11/12/2016  . Degenerative arthritis of lumbar spine 11/12/2016  . Common migraine with intractable migraine 09/20/2016  . Iron deficiency anemia 09/12/2016  . GAD (generalized anxiety disorder) 12/20/2015  . OSA on CPAP 12/20/2015  . Angina effort 12/20/2015  . Hyperglycemia 09/22/2015  . History of colonic polyps   . Benign neoplasm of sigmoid colon   . Frequency 04/14/2015  . Backache 04/14/2015  . Chronic pain of multiple joints 06/14/2014  . Dysmetabolic syndrome 36/14/4315  . Acid reflux 06/14/2014  . Extreme obesity 06/14/2014  . Arthritis, degenerative 06/14/2014  . Morbid obesity with BMI of 40.0-44.9, adult ([place]) 06/14/2014  . Hypothyroidism due to  acquired atrophy of thyroid 08/18/2013  . Hypothyroidism, unspecified 08/18/2013  . Hypertension goal BP (blood pressure) < 140/90 12/07/2010  . Familial multiple lipoprotein-type hyperlipidemia 12/07/2010  . CN (constipation) 11/21/2009  . Allergic rhinitis 10/13/2008  . Asthma, mild intermittent, well-controlled 09/01/2008  . Major depressive disorder, recurrent episode, in partial remission with anxious distress ([place]) 08/13/2007    Social History   Tobacco Use  . Smoking status: Never Smoker  . Smokeless tobacco: Never Used  Substance Use Topics  . Alcohol use: No    Alcohol/week: 0.0 standard drinks     Current Outpatient Medications:  .  albuterol (VENTOLIN HFA) 108 (90 Base) MCG/ACT inhaler, Inhale 2 puffs into the lungs every 6 (six) hours as needed for wheezing or shortness of breath., Disp: 3 Inhaler, Rfl: 0 .  aspirin 81 MG EC tablet, Take 81 mg by mouth daily.  , Disp: , Rfl:  .  atorvastatin (LIPITOR) 40 MG tablet, TAKE 1 TABLET BY MOUTH AT BEDTIME, Disp: 90 tablet, Rfl: 0 .  cloNIDine (CATAPRES) 0.1 MG tablet, TAKE 1 TABLET BY MOUTH TWICE DAILY. START AT NIGHT FOR HOT FLASHES AND TRY TAKING TWICE DAILY IF TOLERATED, Disp: 180 tablet, Rfl: 0 .  conjugated estrogens (PREMARIN) vaginal cream, Place 1 Applicatorful vaginally daily. Apply 0.5mg  (pea-sized amount)  just inside the vaginal introitus with a finger-tip every night for two weeks and then Monday, Wednesday and Friday nights., Disp: 30 g, Rfl: 12 .  cyclobenzaprine (FLEXERIL) 5 MG tablet, Take 5 mg by mouth 3 (three) times daily as needed. , Disp: , Rfl:  0 .  diclofenac (CATAFLAM) 50 MG tablet, Take 1 tablet by mouth three times daily as needed, Disp: 60 tablet, Rfl: 0 .  DULoxetine (CYMBALTA) 20 MG capsule, Take 1 capsule (20 mg total) by mouth daily. To be combined with 60 mg to make it 80 mg daily, Disp: 90 capsule, Rfl: 1 .  DULoxetine (CYMBALTA) 30 MG capsule, Take 1 capsule (30 mg total) by mouth 2 (two) times  daily., Disp: 180 capsule, Rfl: 1 .  EQ ALLERGY RELIEF 10 MG tablet, TAKE ONE TABLET BY MOUTH ONCE DAILY, Disp: 90 tablet, Rfl: 1 .  fluticasone (FLONASE) 50 MCG/ACT nasal spray, Place 2 sprays into both nostrils daily. , Disp: , Rfl:  .  gabapentin (NEURONTIN) 300 MG capsule, One in the morning and 2 in the evening., Disp: 90 capsule, Rfl: 3 .  glucose blood test strip, Use once daily., Disp: 100 each, Rfl: 12 .  hydrOXYzine (ATARAX/VISTARIL) 25 MG tablet, Take 1 tablet (25 mg total) by mouth 3 (three) times daily as needed (for severe anxiety only)., Disp: 270 tablet, Rfl: 1 .  levothyroxine (SYNTHROID, LEVOTHROID) 112 MCG tablet, Take 1 tablet (112 mcg total) by mouth daily., Disp: 90 tablet, Rfl: 0 .  metoprolol succinate (TOPROL-XL) 50 MG 24 hr tablet, TAKE 1 TABLET BY MOUTH DAILY, Disp: 90 tablet, Rfl: 0 .  mometasone (ASMANEX, 120 METERED DOSES,) 220 MCG/INH inhaler, Inhale 2 puffs into the lungs daily., Disp: 3 Inhaler, Rfl: 1 .  montelukast (SINGULAIR) 10 MG tablet, Take 1 tablet (10 mg total) by mouth at bedtime., Disp: 90 tablet, Rfl: 1 .  nitroGLYCERIN (NITROSTAT) 0.4 MG SL tablet, Place 1 tablet (0.4 mg total) under the tongue every 5 (five) minutes as needed., Disp: 25 tablet, Rfl: 0 .  olmesartan-hydrochlorothiazide (BENICAR HCT) 40-25 MG tablet, TAKE 1 TABLET BY MOUTH DAILY GENERIC EQUIVALENT FOR BENICAR HCT, Disp: 90 tablet, Rfl: 0 .  omeprazole (PRILOSEC) 40 MG capsule, Take 1 capsule (40 mg total) by mouth daily., Disp: 90 capsule, Rfl: 1 .  Plecanatide (TRULANCE) 3 MG TABS, Take 1 tablet by mouth daily. In place of linzess, Disp: 90 tablet, Rfl: 1 .  QUEtiapine (SEROQUEL) 50 MG tablet, Take 1-1.5 tablets (50-75 mg total) by mouth at bedtime., Disp: 135 tablet, Rfl: 0 .  rizatriptan (MAXALT) 10 MG tablet, TAKE 1 TABLET BY MOUTH THREE TIMES DAILY AS NEEDED FOR  MIGRAINE, Disp: 10 tablet, Rfl: 0 .  Trospium Chloride 60 MG CP24, Take 1 capsule (60 mg total) by mouth daily., Disp: 90  each, Rfl: 3  Allergies  Allergen Reactions  . Latex Swelling    Pt unsure of reaction pt states something happened while in the hospital after surgery.    I personally reviewed active problem list, medication list, allergies, family history with the patient/caregiver today.  ROS  Ten systems reviewed and is negative except as mentioned in HPI   Objective  Virtual encounter, vitals obtained at home   Nursing Note and Vital Signs reviewed.  Physical Exam  Awake, alert and oriented, her daughter checked for CVA tenderness under my guidance and it was negative   Assessment & Plan  1. Dysuria  - ciprofloxacin (CIPRO) 250 MG tablet; Take 1 tablet (250 mg total) by mouth 2 (two) times daily.  Dispense: 6 tablet; Refill: 0  Advised her to return Monday for face to face if symptoms do not resolve with medication. Also discussed possible side effects  -Red flags and when to present for emergency care or  RTC including fever >101.81F, chest pain, shortness of breath, new/worsening/un-resolving symptoms, reviewed with patient at time of visit. Follow up and care instructions discussed and provided in AVS. - I discussed the assessment and treatment plan with the patient. The patient was provided an opportunity to ask questions and all were answered. The patient agreed with the plan and demonstrated an understanding of the instructions.  I provided 15  minutes of non-face-to-face time during this encounter.  Loistine Chance, MD

## 2018-12-02 ENCOUNTER — Other Ambulatory Visit: Payer: Self-pay

## 2018-12-02 ENCOUNTER — Ambulatory Visit (INDEPENDENT_AMBULATORY_CARE_PROVIDER_SITE_OTHER): Payer: BLUE CROSS/BLUE SHIELD | Admitting: Family Medicine

## 2018-12-02 ENCOUNTER — Encounter: Payer: Self-pay | Admitting: Family Medicine

## 2018-12-02 VITALS — BP 116/79 | HR 64

## 2018-12-02 DIAGNOSIS — E785 Hyperlipidemia, unspecified: Secondary | ICD-10-CM

## 2018-12-02 DIAGNOSIS — J4520 Mild intermittent asthma, uncomplicated: Secondary | ICD-10-CM | POA: Diagnosis not present

## 2018-12-02 DIAGNOSIS — K5909 Other constipation: Secondary | ICD-10-CM

## 2018-12-02 DIAGNOSIS — E1169 Type 2 diabetes mellitus with other specified complication: Secondary | ICD-10-CM

## 2018-12-02 DIAGNOSIS — I10 Essential (primary) hypertension: Secondary | ICD-10-CM

## 2018-12-02 DIAGNOSIS — Z9989 Dependence on other enabling machines and devices: Secondary | ICD-10-CM

## 2018-12-02 DIAGNOSIS — G4733 Obstructive sleep apnea (adult) (pediatric): Secondary | ICD-10-CM

## 2018-12-02 DIAGNOSIS — E1159 Type 2 diabetes mellitus with other circulatory complications: Secondary | ICD-10-CM | POA: Diagnosis not present

## 2018-12-02 DIAGNOSIS — K219 Gastro-esophageal reflux disease without esophagitis: Secondary | ICD-10-CM

## 2018-12-02 DIAGNOSIS — R232 Flushing: Secondary | ICD-10-CM

## 2018-12-02 DIAGNOSIS — Z6841 Body Mass Index (BMI) 40.0 and over, adult: Secondary | ICD-10-CM

## 2018-12-02 DIAGNOSIS — E034 Atrophy of thyroid (acquired): Secondary | ICD-10-CM

## 2018-12-02 DIAGNOSIS — D509 Iron deficiency anemia, unspecified: Secondary | ICD-10-CM

## 2018-12-02 DIAGNOSIS — F331 Major depressive disorder, recurrent, moderate: Secondary | ICD-10-CM

## 2018-12-02 MED ORDER — OMEPRAZOLE 40 MG PO CPDR: 40.0000 mg | DELAYED_RELEASE_CAPSULE | Freq: Every day | ORAL | 1 refills | Status: DC

## 2018-12-02 MED ORDER — LEVOTHYROXINE SODIUM 112 MCG PO TABS: 112.0000 ug | ORAL_TABLET | Freq: Every day | ORAL | 0 refills | Status: DC

## 2018-12-02 MED ORDER — MONTELUKAST SODIUM 10 MG PO TABS: 10.0000 mg | ORAL_TABLET | Freq: Every day | ORAL | 1 refills | Status: DC

## 2018-12-02 MED ORDER — OLMESARTAN MEDOXOMIL-HCTZ 40-25 MG PO TABS: 1.0000 | ORAL_TABLET | Freq: Every day | ORAL | 0 refills | Status: DC

## 2018-12-02 MED ORDER — METOPROLOL SUCCINATE ER 50 MG PO TB24: 50.0000 mg | ORAL_TABLET | Freq: Every day | ORAL | 0 refills | Status: DC

## 2018-12-02 MED ORDER — PLECANATIDE 3 MG PO TABS: 1.0000 | ORAL_TABLET | Freq: Every day | ORAL | 1 refills | Status: DC

## 2018-12-02 MED ORDER — CLONIDINE HCL 0.1 MG PO TABS: 0.1000 mg | ORAL_TABLET | Freq: Two times a day (BID) | ORAL | 1 refills | Status: DC

## 2018-12-02 MED ORDER — ATORVASTATIN CALCIUM 40 MG PO TABS: ORAL_TABLET | ORAL | 0 refills | Status: DC

## 2018-12-02 NOTE — Progress Notes (Signed)
Name: Beverly Barrett   MRN: [id number]    DOB: [birth year]   Date:12/02/2018       Progress Note  Subjective  Chief Complaint  Chief Complaint  Patient presents with  . Medication Refill    States she was nausea this morning but that is normal for her   . Diabetes    Checks BS twice daily, BS Preprandial 139  . Depression  . Hypertension    Edema in bilateral ankles   . Constipation  . Sleep Apnea  . Morbid Obesity  . Migraine  . Asthma    Has been controlled with medication    I connected with  Beverly Barrett  on 12/02/18 at 10:00 AM EDT by a video enabled telemedicine application and verified that I am speaking with the correct person using two identifiers.  I discussed the limitations of evaluation and management by telemedicine and the availability of in person appointments. The patient expressed understanding and agreed to proceed. Staff also discussed with the patient that there may be a patient responsible charge related to this service. Patient Location: home Provider Location: [facility] Additional Individuals present: husband and daughter in the same room   HPI  Diabetes type 2 with dyslipidemia in obese: she has a long history of metabolic syndrome. However hgbA1C gradually went up and back in January 2019 her A1C went to 6.6%, she has gone to DM teaching class, more active, eating better and hgbA1C last 6.2% . She denies polyphagia , but has chronic  polyuria and  Polydipsia. She never took medications for DM, she is on Atorvastatin. Continue life style modification.  Barrett Depression/GAD:She is now under the care of Dr. Shea Evans, weaning self off Alprazolam, taking hdyroxizine prn anxiety and seems to be doing better. phq 9 is up, based on records she is supposed to be taking 80 mg of Duloxetine, but did not get the 20 mg dose, advised to discuss with Dr. Shea Evans to get 3 of 30 mg capsules to decrease cost and increase compliance   HTN: she taking  her medication BenicarHCTZ and Toprol XL. She states dizziness is very seldom now and resolves with dramamin, bp today was well controlled.   Angina: had evaluation by cardiologist and Echo myoview in July normal result. She states chest pain still happens and at times during rest. She is on statin, aspirin, beta-blocker , ARB and has been taking NTG intermittently, but did not have to take any NTG since last visit. Pain is described as throbbing sensation on left side of chest and sometimes radiates to her left shoulder and back. She no recent episodes, not sure when the last time was.   Constipation: she was started on Linzess July 2017 and states has bowel movements daily , no straining or blood in stools, hemoccult negative April 2018. No side effects of medications, no longer having bleeding from hemorrhoids. Unchanged, colonoscopy is up to date   OSA: using CPAP every night, all night, and we started her on SeroquelShe is sleeping 6-7 hours, feeling rested most mornings.Unchanged   Morbid obesity:shestopped walking with her daughter since COVID-19, only walking in her yard now.   GERD:Seen by Dr. Marius Ditch and has gastritis, advised to stop goody powders but is taking nsaid's given by Dr. Phyllis Ginger Waking up with nausea, but improves once she gest up and takes Omeprazole and Dramamine. Discussed importance of stopping NSAID's , but she still taking Diclofenac  Migraine: seen at the headache center by Dr. Jannifer Franklin,  started on Topamax and Maxalt recently, still having headaches at this time, sheis off Topamax, and on gabapentin, but still having symptoms.She states less frequent now at most twice per week now.  Urinary urgency and incontinence: symptoms of low back and supra pubic pain again, urgency, nocturia, and sometimes she cannot make it to the bathroom.Still seeing Urologist. Treated for UTI last week and took Cipro and is back to her baseline   Hypothyroidism she is currently  taking medication daily, she has chronic dry skin and constipation ( resolved with Linzes), lastTSHwasback to normal range , she is nowon Synthroid 112 mcg daily.   DDD lumbar spine: had MRI done, ordered by Ortho and was found to have DDD lumbar spine, seeing Dr. Sharlet Salina. Stable, taking nsaids and flexeril prn Pain today is 8/10. Discussed risk of GI bleed with nsaid's   Asthma mild: long history,she has intermittent symptoms, occasionally has a dry cough and occasional wheezing. Unchanged   Iron deficiency anemia: we will recheck labs, she still has episodes of nausea, up to date with colonoscopy, no blood in stools, but has a history of gastritis, we will recheck labs and hemoccult stools  Patient Active Problem List   Diagnosis Date Noted  . Dental abscess 09/02/2018  . Grade II internal hemorrhoids 05/16/2017  . Mixed stress and urge urinary incontinence 11/12/2016  . Degenerative arthritis of lumbar spine 11/12/2016  . Common migraine with intractable migraine 09/20/2016  . Iron deficiency anemia 09/12/2016  . GAD (generalized anxiety disorder) 12/20/2015  . OSA on CPAP 12/20/2015  . Angina effort 12/20/2015  . Hyperglycemia 09/22/2015  . History of colonic polyps   . Benign neoplasm of sigmoid colon   . Frequency 04/14/2015  . Backache 04/14/2015  . Chronic pain of multiple joints 06/14/2014  . Dysmetabolic syndrome 03/70/4888  . Acid reflux 06/14/2014  . Extreme obesity 06/14/2014  . Arthritis, degenerative 06/14/2014  . Morbid obesity with BMI of 40.0-44.9, adult ([place]) 06/14/2014  . Hypothyroidism due to acquired atrophy of thyroid 08/18/2013  . Hypothyroidism, unspecified 08/18/2013  . Hypertension goal BP (blood pressure) < 140/90 12/07/2010  . Familial multiple lipoprotein-type hyperlipidemia 12/07/2010  . CN (constipation) 11/21/2009  . Allergic rhinitis 10/13/2008  . Asthma, mild intermittent, well-controlled 09/01/2008  . Barrett depressive disorder,  recurrent episode, in partial remission with anxious distress ([place]) 08/13/2007    Past Surgical History:  Procedure Laterality Date  . ABDOMINAL HYSTERECTOMY    . bladder tach  1995  . CESAREAN SECTION    . COLONOSCOPY WITH PROPOFOL N/A 05/29/2015   Procedure: COLONOSCOPY WITH PROPOFOL;  Surgeon: Lucilla Lame, MD;  Location: [place];  Service: Endoscopy;  Laterality: N/A;  Latex allergy sleep apnea - no CPAP machine (yet)  . ESOPHAGOGASTRODUODENOSCOPY N/A 04/23/2017   Procedure: ESOPHAGOGASTRODUODENOSCOPY (EGD);  Surgeon: Lin Landsman, MD;  Location: [place];  Service: Gastroenterology;  Laterality: N/A;  Latex allergy sleep apnea  . POLYPECTOMY  05/29/2015   Procedure: POLYPECTOMY;  Surgeon: Lucilla Lame, MD;  Location: [place];  Service: Endoscopy;;    Family History  Problem Relation Age of Onset  . Diabetes Sister   . Anxiety disorder Sister   . Depression Sister   . Hypertension Sister   . Cirrhosis Mother   . Diabetes Mother   . Cirrhosis Father   . Breast cancer Sister 39  . Heart attack Brother   . Alcohol abuse Brother   . Drug abuse Brother   . Prostate cancer Neg Hx   .  Kidney cancer Neg Hx   . Bladder Cancer Neg Hx     Social History   Socioeconomic History  . Marital status: Married    Spouse name: clayton sr  . Number of children: 3  . Years of education: Not on file  . Highest education level: GED or equivalent  Occupational History  . Not on file  Social Needs  . Financial resource strain: Very hard  . Food insecurity:    Worry: Often true    Inability: Often true  . Transportation needs:    Medical: No    Non-medical: No  Tobacco Use  . Smoking status: Never Smoker  . Smokeless tobacco: Never Used  Substance and Sexual Activity  . Alcohol use: No    Alcohol/week: 0.0 standard drinks  . Drug use: No  . Sexual activity: Yes    Partners: Male  Lifestyle  . Physical activity:    Days per week: 2 days     Minutes per session: 30 min  . Stress: Very much  Relationships  . Social connections:    Talks on phone: Three times a week    Gets together: Once a week    Attends religious service: More than 4 times per year    Active member of club or organization: No    Attends meetings of clubs or organizations: Never    Relationship status: Married  . Intimate partner violence:    Fear of current or ex partner: No    Emotionally abused: No    Physically abused: No    Forced sexual activity: No  Other Topics Concern  . Not on file  Social History Narrative   Lives with husband and one daughter other kids are out of the house   She does not work, husband on disability      Current Outpatient Medications:  .  albuterol (VENTOLIN HFA) 108 (90 Base) MCG/ACT inhaler, Inhale 2 puffs into the lungs every 6 (six) hours as needed for wheezing or shortness of breath., Disp: 3 Inhaler, Rfl: 0 .  aspirin 81 MG EC tablet, Take 81 mg by mouth daily.  , Disp: , Rfl:  .  atorvastatin (LIPITOR) 40 MG tablet, TAKE 1 TABLET BY MOUTH AT BEDTIME, Disp: 90 tablet, Rfl: 0 .  cloNIDine (CATAPRES) 0.1 MG tablet, TAKE 1 TABLET BY MOUTH TWICE DAILY. START AT NIGHT FOR HOT FLASHES AND TRY TAKING TWICE DAILY IF TOLERATED, Disp: 180 tablet, Rfl: 0 .  conjugated estrogens (PREMARIN) vaginal cream, Place 1 Applicatorful vaginally daily. Apply 0.5mg  (pea-sized amount)  just inside the vaginal introitus with a finger-tip every night for two weeks and then Monday, Wednesday and Friday nights., Disp: 30 g, Rfl: 12 .  cyclobenzaprine (FLEXERIL) 5 MG tablet, Take 5 mg by mouth 3 (three) times daily as needed. , Disp: , Rfl: 0 .  diclofenac (CATAFLAM) 50 MG tablet, Take 1 tablet by mouth three times daily as needed, Disp: 60 tablet, Rfl: 0 .  DULoxetine (CYMBALTA) 20 MG capsule, Take 1 capsule (20 mg total) by mouth daily. To be combined with 60 mg to make it 80 mg daily, Disp: 90 capsule, Rfl: 1 .  DULoxetine (CYMBALTA) 30 MG  capsule, Take 1 capsule (30 mg total) by mouth 2 (two) times daily., Disp: 180 capsule, Rfl: 1 .  EQ ALLERGY RELIEF 10 MG tablet, TAKE ONE TABLET BY MOUTH ONCE DAILY, Disp: 90 tablet, Rfl: 1 .  fluticasone (FLONASE) 50 MCG/ACT nasal spray, Place 2 sprays into  both nostrils daily. , Disp: , Rfl:  .  gabapentin (NEURONTIN) 300 MG capsule, One in the morning and 2 in the evening., Disp: 90 capsule, Rfl: 3 .  glucose blood test strip, Use once daily., Disp: 100 each, Rfl: 12 .  hydrOXYzine (ATARAX/VISTARIL) 25 MG tablet, Take 1 tablet (25 mg total) by mouth 3 (three) times daily as needed (for severe anxiety only)., Disp: 270 tablet, Rfl: 1 .  levothyroxine (SYNTHROID, LEVOTHROID) 112 MCG tablet, Take 1 tablet (112 mcg total) by mouth daily., Disp: 90 tablet, Rfl: 0 .  metoprolol succinate (TOPROL-XL) 50 MG 24 hr tablet, TAKE 1 TABLET BY MOUTH DAILY, Disp: 90 tablet, Rfl: 0 .  mometasone (ASMANEX, 120 METERED DOSES,) 220 MCG/INH inhaler, Inhale 2 puffs into the lungs daily., Disp: 3 Inhaler, Rfl: 1 .  montelukast (SINGULAIR) 10 MG tablet, Take 1 tablet (10 mg total) by mouth at bedtime., Disp: 90 tablet, Rfl: 1 .  nitroGLYCERIN (NITROSTAT) 0.4 MG SL tablet, Place 1 tablet (0.4 mg total) under the tongue every 5 (five) minutes as needed., Disp: 25 tablet, Rfl: 0 .  olmesartan-hydrochlorothiazide (BENICAR HCT) 40-25 MG tablet, TAKE 1 TABLET BY MOUTH DAILY GENERIC EQUIVALENT FOR BENICAR HCT, Disp: 90 tablet, Rfl: 0 .  omeprazole (PRILOSEC) 40 MG capsule, Take 1 capsule (40 mg total) by mouth daily., Disp: 90 capsule, Rfl: 1 .  Plecanatide (TRULANCE) 3 MG TABS, Take 1 tablet by mouth daily. In place of linzess, Disp: 90 tablet, Rfl: 1 .  QUEtiapine (SEROQUEL) 50 MG tablet, Take 1-1.5 tablets (50-75 mg total) by mouth at bedtime., Disp: 135 tablet, Rfl: 0 .  rizatriptan (MAXALT) 10 MG tablet, TAKE 1 TABLET BY MOUTH THREE TIMES DAILY AS NEEDED FOR  MIGRAINE, Disp: 10 tablet, Rfl: 0 .  Trospium Chloride 60 MG  CP24, Take 1 capsule (60 mg total) by mouth daily., Disp: 90 each, Rfl: 3 .  ciprofloxacin (CIPRO) 250 MG tablet, Take 1 tablet (250 mg total) by mouth 2 (two) times daily. (Patient not taking: Reported on 12/02/2018), Disp: 6 tablet, Rfl: 0  Allergies  Allergen Reactions  . Latex Swelling    Pt unsure of reaction pt states something happened while in the hospital after surgery.    I personally reviewed active problem list, medication list, allergies, family history with the patient/caregiver today.   ROS  Ten systems reviewed and is negative except as mentioned in HPI   Objective  Virtual encounter, vitals  Obtained at home .   Physical Exam  Awake, alert and oriented   PHQ2/9: Depression screen [facility] 2/9 12/02/2018 11/27/2018 08/03/2018 04/14/2018 03/19/2018  Decreased Interest 3 1 2 2 2   Down, Depressed, Hopeless 2 2 2 2 2   PHQ - 2 Score 5 3 4 4 4   Altered sleeping 1 1 1 1 1   Tired, decreased energy 2 2 2 2 2   Change in appetite 1 0 2 2 2   Feeling bad or failure about yourself  1 1 1 2 2   Trouble concentrating 1 1 0 0 2  Moving slowly or fidgety/restless 1 1 1 3 1   Suicidal thoughts 0 1 0 0 0  PHQ-9 Score 12 10 11 14 14   Difficult doing work/chores Somewhat difficult Very difficult Very difficult - Somewhat difficult  Some recent data might be hidden   PHQ-2/9 Result is positive.    Fall Risk: Fall Risk  12/02/2018 11/27/2018 09/08/2018 08/03/2018 05/28/2018  Falls in the past year? 1 0 0 0 No  Number falls in  past yr: 0 0 0 0 -  Injury with Fall? 1 0 0 0 -  Comment Knee Pain - - - -  Follow up - - - - -     Assessment & Plan  1. Dyslipidemia associated with type 2 diabetes mellitus (HCC)  - Hemoglobin A1c  2. Hyperlipidemia with target LDL less than 100  - atorvastatin (LIPITOR) 40 MG tablet; TAKE 1 TABLET BY MOUTH AT BEDTIME  Dispense: 90 tablet; Refill: 0 - Lipid panel  3. Hypertension associated with type 2 diabetes mellitus (HCC)  -  olmesartan-hydrochlorothiazide (BENICAR HCT) 40-25 MG tablet; Take 1 tablet by mouth daily.  Dispense: 90 tablet; Refill: 0 - metoprolol succinate (TOPROL-XL) 50 MG 24 hr tablet; Take 1 tablet (50 mg total) by mouth daily. Take with or immediately following a meal.  Dispense: 90 tablet; Refill: 0 - COMPLETE METABOLIC PANEL WITH GFR - Hemoglobin A1c  4. Asthma, mild intermittent, well-controlled  - montelukast (SINGULAIR) 10 MG tablet; Take 1 tablet (10 mg total) by mouth at bedtime.  Dispense: 90 tablet; Refill: 1  5. Gastroesophageal reflux disease without esophagitis  - omeprazole (PRILOSEC) 40 MG capsule; Take 1 capsule (40 mg total) by mouth daily.  Dispense: 90 capsule; Refill: 1  6. Hot flashes  - cloNIDine (CATAPRES) 0.1 MG tablet; Take 1 tablet (0.1 mg total) by mouth 2 (two) times daily.  Dispense: 180 tablet; Refill: 1  7. OSA on CPAP  Compliant   8. Morbid obesity with BMI of 40.0-44.9, adult [facility])  Discussed with the patient the risk posed by an increased BMI. Discussed importance of portion control, calorie counting and at least 150 minutes of physical activity weekly. Avoid sweet beverages and drink more water. Eat at least 6 servings of fruit and vegetables daily   9. Hypothyroidism due to acquired atrophy of thyroid  - levothyroxine (SYNTHROID) 112 MCG tablet; Take 1 tablet (112 mcg total) by mouth daily.  Dispense: 90 tablet; Refill: 0  10. Depression, Barrett, recurrent, moderate ([place])  Continue follow up with Dr. Shea Evans   11. Chronic constipation  - Plecanatide (TRULANCE) 3 MG TABS; Take 1 tablet by mouth daily. In place of linzess  Dispense: 90 tablet; Refill: 1  12. Iron deficiency anemia, unspecified iron deficiency anemia type  - CBC with Differential/Platelet - Iron, TIBC and Ferritin Panel  I discussed the assessment and treatment plan with the patient. The patient was provided an opportunity to ask questions and all were answered. The patient agreed  with the plan and demonstrated an understanding of the instructions.  The patient was advised to call back or seek an in-person evaluation if the symptoms worsen or if the condition fails to improve as anticipated.  I provided 25 minutes of non-face-to-face time during this encounter.

## 2018-12-03 ENCOUNTER — Telehealth: Payer: Self-pay | Admitting: Psychiatry

## 2018-12-03 NOTE — Progress Notes (Signed)
Called patient , left message to call back or schedule appointment to discuss medications.

## 2018-12-03 NOTE — Telephone Encounter (Signed)
Called patient to discuss , left message to call back as well as to schedule an appointment.

## 2018-12-03 NOTE — Telephone Encounter (Signed)
-----   Message from Steele Sizer, MD sent at 12/02/2018 10:40 AM EDT ----- Mrs. Holton is not taking Duloxetine 20 mg. Wondering if you could either give her 4 of 20 mg dose or go up to 90 mg ( 3 capsules) to simplify her regiment.  Thank you for all you do for our  patients.  Drue Stager

## 2018-12-04 ENCOUNTER — Ambulatory Visit (INDEPENDENT_AMBULATORY_CARE_PROVIDER_SITE_OTHER): Payer: BLUE CROSS/BLUE SHIELD | Admitting: Psychiatry

## 2018-12-04 ENCOUNTER — Other Ambulatory Visit: Payer: Self-pay

## 2018-12-04 ENCOUNTER — Encounter: Payer: Self-pay | Admitting: Psychiatry

## 2018-12-04 DIAGNOSIS — F331 Major depressive disorder, recurrent, moderate: Secondary | ICD-10-CM

## 2018-12-04 DIAGNOSIS — F411 Generalized anxiety disorder: Secondary | ICD-10-CM | POA: Diagnosis not present

## 2018-12-04 MED ORDER — QUETIAPINE FUMARATE 50 MG PO TABS: 50.0000 mg | ORAL_TABLET | Freq: Every day | ORAL | 1 refills | Status: DC

## 2018-12-04 MED ORDER — DULOXETINE HCL 30 MG PO CPEP: 90.0000 mg | ORAL_CAPSULE | Freq: Every day | ORAL | 1 refills | Status: DC

## 2018-12-04 NOTE — Progress Notes (Signed)
Virtual Visit via Video Note  I connected with Beverly Barrett on 12/04/18 at 10:30 AM EDT by a video enabled telemedicine application and verified that I am speaking with the correct person using two identifiers.   I discussed the limitations of evaluation and management by telemedicine and the availability of in person appointments. The patient expressed understanding and agreed to proceed.    I discussed the assessment and treatment plan with the patient. The patient was provided an opportunity to ask questions and all were answered. The patient agreed with the plan and demonstrated an understanding of the instructions.   The patient was advised to call back or seek an in-person evaluation if the symptoms worsen or if the condition fails to improve as anticipated.   [place] MD OP Progress Note  12/04/2018 1:15 PM Beverly Barrett  MRN:  [id number]  Chief Complaint:  Chief Complaint    Follow-up     HPI: Beverly Barrett is a 58 year old African-American female, married, lives in [place], has a history of MDD, GAD, arthritis, chronic pain, OSA on CPAP, hypothyroidism was evaluated by telemedicine today.  Patient today reports she continues to struggle with some anxiety symptoms.  She reports she is worried about the COVID-19 outbreak.  She continues to be taking Cymbalta but takes only 60 mg.  Discussed readjusting her dosage to a 90 mg today.  Patient reports sleep is fair.  She reports she takes Seroquel which helps.  Patient denies any side effects to the medications.  She has good support system from her daughter.  She continues to follow-up with her primary medical doctor.  Patient denies any other concerns today. Visit Diagnosis:    ICD-10-CM   1. MDD (major depressive disorder), recurrent episode, moderate (HCC) F33.1 DULoxetine (CYMBALTA) 30 MG capsule    QUEtiapine (SEROQUEL) 50 MG tablet  2. GAD (generalized anxiety disorder) F41.1 DULoxetine (CYMBALTA) 30 MG capsule     Past Psychiatric History: Reviewed past psychiatric history from my progress note on 01/21/2018.  Past trials of Seroquel, Cymbalta, Xanax  Past Medical History:  Past Medical History:  Diagnosis Date  . Anginal pain ([place])    none in approx 2 yrs  . Anxiety   . Asthma   . Chronic lower back pain   . Common migraine with intractable migraine 09/20/2016  . Depression    suicidal ideation  . Diabetes mellitus without complication ([place])   . GERD (gastroesophageal reflux disease)   . Headache    migraines -3x/wk  . Hyperlipidemia   . Hypertension   . Hypothyroidism   . Motion sickness    cars  . Multilevel degenerative disc disease   . Shortness of breath dyspnea   . Sleep apnea    CPAP  . Vertigo   . Wears dentures    partial upper    Past Surgical History:  Procedure Laterality Date  . ABDOMINAL HYSTERECTOMY    . bladder tach  1995  . CESAREAN SECTION    . COLONOSCOPY WITH PROPOFOL N/A 05/29/2015   Procedure: COLONOSCOPY WITH PROPOFOL;  Surgeon: Lucilla Lame, MD;  Location: [facility];  Service: Endoscopy;  Laterality: N/A;  Latex allergy sleep apnea - no CPAP machine (yet)  . ESOPHAGOGASTRODUODENOSCOPY N/A 04/23/2017   Procedure: ESOPHAGOGASTRODUODENOSCOPY (EGD);  Surgeon: Lin Landsman, MD;  Location: [facility];  Service: Gastroenterology;  Laterality: N/A;  Latex allergy sleep apnea  . POLYPECTOMY  05/29/2015   Procedure: POLYPECTOMY;  Surgeon: Lucilla Lame, MD;  Location: [facility];  Service: Endoscopy;;    Family Psychiatric History: Reviewed family psychiatric history from my progress note on 01/21/2018.  Family History:  Family History  Problem Relation Age of Onset  . Diabetes Sister   . Anxiety disorder Sister   . Depression Sister   . Hypertension Sister   . Cirrhosis Mother   . Diabetes Mother   . Cirrhosis Father   . Breast cancer Sister 66  . Heart attack Brother   . Alcohol abuse Brother   . Drug abuse Brother    . Prostate cancer Neg Hx   . Kidney cancer Neg Hx   . Bladder Cancer Neg Hx     Social History: Reviewed social history from my progress note on 01/21/2018. Social History   Socioeconomic History  . Marital status: Married    Spouse name: clayton sr  . Number of children: 3  . Years of education: Not on file  . Highest education level: GED or equivalent  Occupational History  . Not on file  Social Needs  . Financial resource strain: Very hard  . Food insecurity:    Worry: Often true    Inability: Often true  . Transportation needs:    Medical: No    Non-medical: No  Tobacco Use  . Smoking status: Never Smoker  . Smokeless tobacco: Never Used  Substance and Sexual Activity  . Alcohol use: No    Alcohol/week: 0.0 standard drinks  . Drug use: No  . Sexual activity: Yes    Partners: Male  Lifestyle  . Physical activity:    Days per week: 2 days    Minutes per session: 30 min  . Stress: Very much  Relationships  . Social connections:    Talks on phone: Three times a week    Gets together: Once a week    Attends religious service: More than 4 times per year    Active member of club or organization: No    Attends meetings of clubs or organizations: Never    Relationship status: Married  Other Topics Concern  . Not on file  Social History Narrative   Lives with husband and one daughter other kids are out of the house   She does not work, husband on disability     Allergies:  Allergies  Allergen Reactions  . Latex Swelling    Pt unsure of reaction pt states something happened while in the hospital after surgery.    Metabolic Disorder Labs: Lab Results  Component Value Date   HGBA1C 6.3 (A) 08/03/2018   HGBA1C 6.3 08/03/2018   HGBA1C 6.3 08/03/2018   HGBA1C 6.3 08/03/2018   MPG 143 08/18/2017   MPG 128 02/12/2017   No results found for: PROLACTIN Lab Results  Component Value Date   CHOL 159 03/23/2018   TRIG 211 (H) 03/23/2018   HDL 43 (L) 03/23/2018    CHOLHDL 3.7 03/23/2018   VLDL 44 (H) 02/12/2017   LDLCALC 85 03/23/2018   LDLCALC 99 08/18/2017   Lab Results  Component Value Date   TSH 2.74 10/26/2018   TSH 7.94 (H) 09/08/2018    Therapeutic Level Labs: No results found for: LITHIUM No results found for: VALPROATE No components found for:  CBMZ  Current Medications: Current Outpatient Medications  Medication Sig Dispense Refill  . albuterol (VENTOLIN HFA) 108 (90 Base) MCG/ACT inhaler Inhale 2 puffs into the lungs every 6 (six) hours as needed for wheezing or shortness of breath. 3 Inhaler 0  . aspirin 81  MG EC tablet Take 81 mg by mouth daily.      Marland Kitchen atorvastatin (LIPITOR) 40 MG tablet TAKE 1 TABLET BY MOUTH AT BEDTIME 90 tablet 0  . cloNIDine (CATAPRES) 0.1 MG tablet Take 1 tablet (0.1 mg total) by mouth 2 (two) times daily. 180 tablet 1  . conjugated estrogens (PREMARIN) vaginal cream Place 1 Applicatorful vaginally daily. Apply 0.5mg  (pea-sized amount)  just inside the vaginal introitus with a finger-tip every night for two weeks and then Monday, Wednesday and Friday nights. 30 g 12  . cyclobenzaprine (FLEXERIL) 5 MG tablet Take 5 mg by mouth 3 (three) times daily as needed.   0  . diclofenac (CATAFLAM) 50 MG tablet Take 1 tablet by mouth three times daily as needed 60 tablet 0  . DULoxetine (CYMBALTA) 30 MG capsule Take 3 capsules (90 mg total) by mouth daily. 270 capsule 1  . EQ ALLERGY RELIEF 10 MG tablet TAKE ONE TABLET BY MOUTH ONCE DAILY 90 tablet 1  . fluticasone (FLONASE) 50 MCG/ACT nasal spray Place 2 sprays into both nostrils daily.     Marland Kitchen gabapentin (NEURONTIN) 300 MG capsule One in the morning and 2 in the evening. 90 capsule 3  . glucose blood test strip Use once daily. 100 each 12  . hydrOXYzine (ATARAX/VISTARIL) 25 MG tablet Take 1 tablet (25 mg total) by mouth 3 (three) times daily as needed (for severe anxiety only). 270 tablet 1  . levothyroxine (SYNTHROID) 112 MCG tablet Take 1 tablet (112 mcg total) by  mouth daily. 90 tablet 0  . metoprolol succinate (TOPROL-XL) 50 MG 24 hr tablet Take 1 tablet (50 mg total) by mouth daily. Take with or immediately following a meal. 90 tablet 0  . mometasone (ASMANEX, 120 METERED DOSES,) 220 MCG/INH inhaler Inhale 2 puffs into the lungs daily. 3 Inhaler 1  . montelukast (SINGULAIR) 10 MG tablet Take 1 tablet (10 mg total) by mouth at bedtime. 90 tablet 1  . nitroGLYCERIN (NITROSTAT) 0.4 MG SL tablet Place 1 tablet (0.4 mg total) under the tongue every 5 (five) minutes as needed. 25 tablet 0  . olmesartan-hydrochlorothiazide (BENICAR HCT) 40-25 MG tablet Take 1 tablet by mouth daily. 90 tablet 0  . omeprazole (PRILOSEC) 40 MG capsule Take 1 capsule (40 mg total) by mouth daily. 90 capsule 1  . Plecanatide (TRULANCE) 3 MG TABS Take 1 tablet by mouth daily. In place of linzess 90 tablet 1  . QUEtiapine (SEROQUEL) 50 MG tablet Take 1-1.5 tablets (50-75 mg total) by mouth at bedtime. 135 tablet 1  . rizatriptan (MAXALT) 10 MG tablet TAKE 1 TABLET BY MOUTH THREE TIMES DAILY AS NEEDED FOR  MIGRAINE 10 tablet 0  . Trospium Chloride 60 MG CP24 Take 1 capsule (60 mg total) by mouth daily. 90 each 3   No current facility-administered medications for this visit.      Musculoskeletal: Strength & Muscle Tone: within normal limits Gait & Station: normal Patient leans: N/A  Psychiatric Specialty Exam: Review of Systems  Psychiatric/Behavioral: The patient is nervous/anxious.   All other systems reviewed and are negative.   There were no vitals taken for this visit.There is no height or weight on file to calculate BMI.  General Appearance: Casual  Eye Contact:  Fair  Speech:  Clear and Coherent  Volume:  Normal  Mood:  Anxious  Affect:  Congruent  Thought Process:  Goal Directed and Descriptions of Associations: Intact  Orientation:  Full (Time, Place, and Person)  Thought Content:  Logical   Suicidal Thoughts:  No  Homicidal Thoughts:  No  Memory:  Immediate;    Fair Recent;   Fair Remote;   Fair  Judgement:  Fair  Insight:  Fair  Psychomotor Activity:  Normal  Concentration:  Concentration: Fair and Attention Span: Fair  Recall:  [employer] of Knowledge: Fair  Language: Fair  Akathisia:  No  Handed:  Right  AIMS (if indicated): denies tremors, rigidity  Assets:  Communication Skills Desire for Improvement Housing Social Support  ADL's:  Intact  Cognition: WNL  Sleep:  Fair   Screenings: GAD-7     Office Visit from 03/19/2018 in [facility] Office Visit from 03/04/2016 in [facility] Office Visit from 12/20/2015 in [facility]  Total GAD-7 Score  14  18  18     PHQ2-9     [facility] Visit from 12/02/2018 in [facility] Office Visit from 11/27/2018 in [facility] Office Visit from 08/03/2018 in [facility] from 04/14/2018 in [facility] Office Visit from 03/19/2018 in [facility]  PHQ-2 Total Score  5  3  4  4  4   PHQ-9 Total Score  12  10  11  14  14        Assessment and Plan: Aneesa is a 58 year old African-American female who is married, lives in [place], has a history of depression, anxiety, insomnia, OSA on CPAP, hypothyroidism, arthritis, was evaluated by telemedicine today.  Patient continues to struggle with anxiety and will benefit from medication readjustment.  Plan as noted below.  Plan MDD-improving Cymbalta as prescribed Seroquel 50-75 mg p.o. nightly  For GAD-unstable Increase Cymbalta to 90 mg p.o. daily She will continue to make use of hydroxyzine 25 mg p.o. 3 times daily as needed  Follow-up in clinic in 6 to 8 weeks or sooner if needed.  I have spent atleast 15 minutes non face to face with patient today. More than 50 % of the time was spent for psychoeducation and supportive psychotherapy and care coordination.  This note was generated in part or  whole with voice recognition software. Voice recognition is usually quite accurate but there are transcription errors that can and very often do occur. I apologize for any typographical errors that were not detected and corrected.        Ursula Alert, MD 12/04/2018, 1:15 PM

## 2018-12-10 LAB — HEMOGLOBIN A1C
Hgb A1c MFr Bld: 6.9 % of total Hgb — ABNORMAL HIGH (ref <5.7)
Mean Plasma Glucose: 151 (calc)
eAG (mmol/L): 8.4 (calc)

## 2018-12-10 LAB — LIPID PANEL
Cholesterol: 159 mg/dL (ref <200)
HDL: 38 mg/dL — ABNORMAL LOW (ref >=50)
LDL Cholesterol (Calc): 84 mg/dL (calc)
Non-HDL Cholesterol (Calc): 121 mg/dL (calc) (ref <130)
Total CHOL/HDL Ratio: 4.2 (calc) (ref <5.0)
Triglycerides: 305 mg/dL — ABNORMAL HIGH (ref <150)

## 2018-12-10 LAB — CBC WITH DIFFERENTIAL/PLATELET
Absolute Monocytes: 986 cells/uL — ABNORMAL HIGH (ref 200–950)
Basophils Absolute: 85 cells/uL (ref 0–200)
Basophils Relative: 0.8 %
Eosinophils Absolute: 350 cells/uL (ref 15–500)
Eosinophils Relative: 3.3 %
HCT: 32.8 % — ABNORMAL LOW (ref 35.0–45.0)
Hemoglobin: 11.2 g/dL — ABNORMAL LOW (ref 11.7–15.5)
Lymphs Abs: 2533 cells/uL (ref 850–3900)
MCH: 28.1 pg (ref 27.0–33.0)
MCHC: 34.1 g/dL (ref 32.0–36.0)
MCV: 82.2 fL (ref 80.0–100.0)
MPV: 10.8 fL (ref 7.5–12.5)
Monocytes Relative: 9.3 %
Neutro Abs: 6646 cells/uL (ref 1500–7800)
Neutrophils Relative %: 62.7 %
Platelets: 311 10*3/uL (ref 140–400)
RBC: 3.99 10*6/uL (ref 3.80–5.10)
RDW: 16.7 % — ABNORMAL HIGH (ref 11.0–15.0)
Total Lymphocyte: 23.9 %
WBC: 10.6 10*3/uL (ref 3.8–10.8)

## 2018-12-10 LAB — IRON,TIBC AND FERRITIN PANEL
%SAT: 13 % (calc) — ABNORMAL LOW (ref 16–45)
Ferritin: 22 ng/mL (ref 16–232)
Iron: 50 ug/dL (ref 45–160)
TIBC: 386 mcg/dL (calc) (ref 250–450)

## 2018-12-10 LAB — COMPLETE METABOLIC PANEL WITH GFR
AG Ratio: 1.5 (calc) (ref 1.0–2.5)
ALT: 22 U/L (ref 6–29)
AST: 21 U/L (ref 10–35)
Albumin: 4.1 g/dL (ref 3.6–5.1)
Alkaline phosphatase (APISO): 60 U/L (ref 37–153)
BUN: 18 mg/dL (ref 7–25)
CO2: 27 mmol/L (ref 20–32)
Calcium: 9.1 mg/dL (ref 8.6–10.4)
Chloride: 105 mmol/L (ref 98–110)
Creat: 0.86 mg/dL (ref 0.50–1.05)
GFR, Est African American: 86 mL/min/{1.73_m2} (ref >=60)
GFR, Est Non African American: 74 mL/min/{1.73_m2} (ref >=60)
Globulin: 2.8 g/dL (calc) (ref 1.9–3.7)
Glucose, Bld: 141 mg/dL — ABNORMAL HIGH (ref 65–99)
Potassium: 4.1 mmol/L (ref 3.5–5.3)
Sodium: 142 mmol/L (ref 135–146)
Total Bilirubin: 0.4 mg/dL (ref 0.2–1.2)
Total Protein: 6.9 g/dL (ref 6.1–8.1)

## 2018-12-11 ENCOUNTER — Encounter: Payer: Self-pay | Admitting: *Deleted

## 2018-12-14 ENCOUNTER — Ambulatory Visit (INDEPENDENT_AMBULATORY_CARE_PROVIDER_SITE_OTHER): Payer: BLUE CROSS/BLUE SHIELD | Admitting: Licensed Clinical Social Worker

## 2018-12-14 ENCOUNTER — Other Ambulatory Visit: Payer: Self-pay

## 2018-12-14 DIAGNOSIS — F411 Generalized anxiety disorder: Secondary | ICD-10-CM | POA: Diagnosis not present

## 2018-12-14 DIAGNOSIS — F331 Major depressive disorder, recurrent, moderate: Secondary | ICD-10-CM | POA: Diagnosis not present

## 2018-12-22 NOTE — Progress Notes (Signed)
Virtual Visit via Telephone Note  I connected with Beverly Barrett on 12/14/18 at  4:00 PM EDT by telephone and verified that I am speaking with the correct person using two identifiers.  Location: Patient: home Provider: office   I discussed the limitations, risks, security and privacy concerns of performing an evaluation and management service by telephone and the availability of in person appointments. I also discussed with the patient that there may be a patient responsible charge related to this service. The patient expressed understanding and agreed to proceed.   Participation Level: Active  Type of Therapy: Individual Therapy  Treatment Goals addressed: Coping  Interventions: CBT and Motivational Interviewing  Summary: Beverly Barrett is a 58 y.o. female who presents with continued symptoms.  Patient was open about her current stressors.  Discussion and explored copings skills.   Suicidal/Homicidal: No  Plan: Return again in 2 weeks.  Diagnosis: Axis I: Generalized Anxiety Disorder    Axis II: No diagnosis     I discussed the assessment and treatment plan with the patient. The patient was provided an opportunity to ask questions and all were answered. The patient agreed with the plan and demonstrated an understanding of the instructions.   The patient was advised to call back or seek an in-person evaluation if the symptoms worsen or if the condition fails to improve as anticipated.  I provided 30 minutes of non-face-to-face time during this encounter.   Lubertha South, LCSW

## 2018-12-28 ENCOUNTER — Telehealth: Payer: Self-pay | Admitting: Neurology

## 2018-12-28 NOTE — Telephone Encounter (Signed)
Due to current COVID 19 pandemic, our office is severely reducing in office visits until further notice, in order to minimize the risk to our patients and healthcare providers.   Called patient and spoke with daughter to confirm a virtual visit for pt's 6/4 appointment. Patient's daughter verbalized understanding of the doxy.me process and I have sent her an e-mail with link and directions as well as my name and office number/hours for reference. Patient understands that she will receive a call from RN to update chart.  Pt understands that although there may be some limitations with this type of visit, we will take all precautions to reduce any security or privacy concerns.  Pt understands that this will be treated like an in office visit and we will file with pt's insurance, and there may be a patient responsible charge related to this service.

## 2018-12-30 NOTE — Telephone Encounter (Signed)
I contacted the pt and updated allergies, meds and PMH. [name] daughter will assist with the doxy.me visit tomorrow.

## 2018-12-31 ENCOUNTER — Encounter: Payer: Self-pay | Admitting: Neurology

## 2018-12-31 ENCOUNTER — Ambulatory Visit (INDEPENDENT_AMBULATORY_CARE_PROVIDER_SITE_OTHER): Payer: BLUE CROSS/BLUE SHIELD | Admitting: Neurology

## 2018-12-31 ENCOUNTER — Other Ambulatory Visit: Payer: Self-pay

## 2018-12-31 DIAGNOSIS — G43019 Migraine without aura, intractable, without status migrainosus: Secondary | ICD-10-CM | POA: Diagnosis not present

## 2018-12-31 DIAGNOSIS — R269 Unspecified abnormalities of gait and mobility: Secondary | ICD-10-CM | POA: Diagnosis not present

## 2018-12-31 DIAGNOSIS — M5442 Lumbago with sciatica, left side: Secondary | ICD-10-CM | POA: Diagnosis not present

## 2018-12-31 DIAGNOSIS — G8929 Other chronic pain: Secondary | ICD-10-CM | POA: Diagnosis not present

## 2018-12-31 MED ORDER — GABAPENTIN 400 MG PO CAPS: ORAL_CAPSULE | ORAL | 1 refills | Status: DC

## 2018-12-31 NOTE — Progress Notes (Signed)
     Virtual Visit via Video Note  I connected with [name] on 12/31/18 at  9:00 AM EDT by a video enabled telemedicine application and verified that I am speaking with the correct person using two identifiers.  Location: Patient: The patient is in her home. Provider: Physician in office.   I discussed the limitations of evaluation and management by telemedicine and the availability of in person appointments. The patient expressed understanding and agreed to proceed.  History of Present Illness: Beverly Barrett is a 58 year old right-handed black female with a history of intractable migraine headache.  The patient reports that gabapentin has helped her headaches, she takes 300 mg in the morning and 600 mg in the evening.  She tolerates the medication well.  She is obese, she has chronic low back pain, she is followed through [facility] clinic for this, she gets injections on her back.  Within the last 2 months, she has had a change in her balance, she has had at least 2 falls.  She is having some increase in her back pain with pain down the left greater than right leg.  She feels somewhat weak in the left leg.  She is now using a cane within the last 2 months.  Her headaches are occurring once or twice a week, she has been having daily headaches.  She will take a BC powder or Maxalt for the headache when it does come on.  She will lie down with the headache.  She claims that stress will increase her headaches.  She feels that she has been under some increased stress recently, she is also followed for and treated for depression.   Observations/Objective: The video evaluation reveals that the patient is markedly obese.  She is alert and cooperative, speech is well enunciated, not aphasic or dysarthric.  Extraocular movements are full.  Visual fields are full.  Speech is well enunciated, no dysarthria or aphasia is noted.  The patient has good finger-nose-finger and heel shin bilaterally.   Gait is slightly wide-based, the patient uses a cane for ambulation.  Romberg is negative.  Tandem gait was not attempted.  Assessment and Plan: 1.  History of migraine headache, intractable  2.  Gait disorder, new onset  3.  Obesity  4.  Chronic low back pain, left greater than right leg pain  The patient has had a change in her balance issues.  We will send her for physical therapy for gait training.  The patient will follow-up here in 3 months, we will reevaluate the walking issue after physical therapy, we may consider MRI of the lumbar spine in the future.  The patient will be increased on the gabapentin taking 400 mg in the morning and 800 mg in the evening.  She will call for any other dose adjustments.  Follow Up Instructions: 59-month follow-up with me.   I discussed the assessment and treatment plan with the patient. The patient was provided an opportunity to ask questions and all were answered. The patient agreed with the plan and demonstrated an understanding of the instructions.   The patient was advised to call back or seek an in-person evaluation if the symptoms worsen or if the condition fails to improve as anticipated.  I provided 25 minutes of non-face-to-face time during this encounter.   Kathrynn Ducking, MD

## 2019-01-18 ENCOUNTER — Other Ambulatory Visit: Payer: Self-pay

## 2019-01-18 ENCOUNTER — Ambulatory Visit: Payer: BLUE CROSS/BLUE SHIELD | Admitting: Licensed Clinical Social Worker

## 2019-01-18 ENCOUNTER — Other Ambulatory Visit: Payer: Self-pay | Admitting: Family Medicine

## 2019-01-18 ENCOUNTER — Encounter: Payer: Self-pay | Admitting: Family Medicine

## 2019-01-18 DIAGNOSIS — G43009 Migraine without aura, not intractable, without status migrainosus: Secondary | ICD-10-CM

## 2019-01-29 ENCOUNTER — Other Ambulatory Visit: Payer: Self-pay | Admitting: Family Medicine

## 2019-01-29 DIAGNOSIS — K219 Gastro-esophageal reflux disease without esophagitis: Secondary | ICD-10-CM

## 2019-02-04 ENCOUNTER — Encounter: Payer: Self-pay | Admitting: Psychiatry

## 2019-02-04 ENCOUNTER — Other Ambulatory Visit: Payer: Self-pay

## 2019-02-04 ENCOUNTER — Ambulatory Visit (INDEPENDENT_AMBULATORY_CARE_PROVIDER_SITE_OTHER): Payer: BLUE CROSS/BLUE SHIELD | Admitting: Psychiatry

## 2019-02-04 DIAGNOSIS — F331 Major depressive disorder, recurrent, moderate: Secondary | ICD-10-CM

## 2019-02-04 DIAGNOSIS — F411 Generalized anxiety disorder: Secondary | ICD-10-CM

## 2019-02-04 MED ORDER — HYDROXYZINE HCL 25 MG PO TABS: 25.0000 mg | ORAL_TABLET | Freq: Three times a day (TID) | ORAL | 1 refills | Status: DC | PRN

## 2019-02-04 NOTE — Progress Notes (Signed)
Virtual Visit via Telephone Note  I connected with [name] on 02/04/19 at 10:30 AM EDT by telephone and verified that I am speaking with the correct person using two identifiers.   I discussed the limitations, risks, security and privacy concerns of performing an evaluation and management service by telephone and the availability of in person appointments. I also discussed with the patient that there may be a patient responsible charge related to this service. The patient expressed understanding and agreed to proceed.     I discussed the assessment and treatment plan with the patient. The patient was provided an opportunity to ask questions and all were answered. The patient agreed with the plan and demonstrated an understanding of the instructions.   The patient was advised to call back or seek an in-person evaluation if the symptoms worsen or if the condition fails to improve as anticipated.   [facility] MD OP Progress Note  02/04/2019 7:41 PM Beverly Barrett  MRN:  [id number]  Chief Complaint:  Chief Complaint    Follow-up     HPI: Beverly Barrett is a 58 year old African-American female, married, lives in [place], has a history of MDD, GAD, arthritis, chronic pain, OSA on CPAP, hypothyroidism was evaluated by telemedicine today.  Patient was offered video call however declined.  Patient today reports she continues to make progress on the current medication regimen.  She however continues to be worried about the COVID-19 outbreak however has been staying safe.  She reports sleep is fair on the current medication.  She denies any side effects to her Seroquel or her Cymbalta.  She reports she continues to good social support system.  She denies any other concerns today.  Visit Diagnosis:    ICD-10-CM   1. MDD (major depressive disorder), recurrent episode, moderate (HCC)  F33.1    Improving  2. GAD (generalized anxiety disorder)  F41.1 hydrOXYzine (ATARAX/VISTARIL) 25 MG  tablet    Past Psychiatric History: I have reviewed past psychiatric history from my progress note on 01/21/2018  Past Medical History:  Past Medical History:  Diagnosis Date  . Anginal pain ([place])    none in approx 2 yrs  . Anxiety   . Asthma   . Chronic lower back pain   . Common migraine with intractable migraine 09/20/2016  . Depression    suicidal ideation  . Diabetes mellitus without complication ([place])   . GERD (gastroesophageal reflux disease)   . Headache    migraines -3x/wk  . Hyperlipidemia   . Hypertension   . Hypothyroidism   . Motion sickness    cars  . Multilevel degenerative disc disease   . Shortness of breath dyspnea   . Sleep apnea    CPAP  . Vertigo   . Wears dentures    partial upper    Past Surgical History:  Procedure Laterality Date  . ABDOMINAL HYSTERECTOMY    . bladder tach  1995  . CESAREAN SECTION    . COLONOSCOPY WITH PROPOFOL N/A 05/29/2015   Procedure: COLONOSCOPY WITH PROPOFOL;  Surgeon: Lucilla Lame, MD;  Location: [facility];  Service: Endoscopy;  Laterality: N/A;  Latex allergy sleep apnea - no CPAP machine (yet)  . ESOPHAGOGASTRODUODENOSCOPY N/A 04/23/2017   Procedure: ESOPHAGOGASTRODUODENOSCOPY (EGD);  Surgeon: Lin Landsman, MD;  Location: [facility];  Service: Gastroenterology;  Laterality: N/A;  Latex allergy sleep apnea  . POLYPECTOMY  05/29/2015   Procedure: POLYPECTOMY;  Surgeon: Lucilla Lame, MD;  Location: [facility];  Service: Endoscopy;;  Family Psychiatric History: I have reviewed family psychiatric history from my progress note on 26 2019.  Family History:  Family History  Problem Relation Age of Onset  . Diabetes Sister   . Anxiety disorder Sister   . Depression Sister   . Hypertension Sister   . Cirrhosis Mother   . Diabetes Mother   . Cirrhosis Father   . Breast cancer Sister 50  . Heart attack Brother   . Alcohol abuse Brother   . Drug abuse Brother   . Prostate cancer Neg  Hx   . Kidney cancer Neg Hx   . Bladder Cancer Neg Hx     Social History: I have reviewed social history from my progress note on 01/21/2018. Social History   Socioeconomic History  . Marital status: Married    Spouse name: clayton sr  . Number of children: 3  . Years of education: Not on file  . Highest education level: GED or equivalent  Occupational History  . Not on file  Social Needs  . Financial resource strain: Very hard  . Food insecurity    Worry: Often true    Inability: Often true  . Transportation needs    Medical: No    Non-medical: No  Tobacco Use  . Smoking status: Never Smoker  . Smokeless tobacco: Never Used  Substance and Sexual Activity  . Alcohol use: No    Alcohol/week: 0.0 standard drinks  . Drug use: No  . Sexual activity: Yes    Partners: Male  Lifestyle  . Physical activity    Days per week: 2 days    Minutes per session: 30 min  . Stress: Very much  Relationships  . Social Herbalist on phone: Three times a week    Gets together: Once a week    Attends religious service: More than 4 times per year    Active member of club or organization: No    Attends meetings of clubs or organizations: Never    Relationship status: Married  Other Topics Concern  . Not on file  Social History Narrative   Lives with husband and one daughter other kids are out of the house   She does not work, husband on disability     Allergies:  Allergies  Allergen Reactions  . Latex Swelling    Pt unsure of reaction pt states something happened while in the hospital after surgery.    Metabolic Disorder Labs: Lab Results  Component Value Date   HGBA1C 6.9 (H) 12/09/2018   MPG 151 12/09/2018   MPG 143 08/18/2017   No results found for: PROLACTIN Lab Results  Component Value Date   CHOL 159 12/09/2018   TRIG 305 (H) 12/09/2018   HDL 38 (L) 12/09/2018   CHOLHDL 4.2 12/09/2018   VLDL 44 (H) 02/12/2017   LDLCALC 84 12/09/2018   LDLCALC 85  03/23/2018   Lab Results  Component Value Date   TSH 2.74 10/26/2018   TSH 7.94 (H) 09/08/2018    Therapeutic Level Labs: No results found for: LITHIUM No results found for: VALPROATE No components found for:  CBMZ  Current Medications: Current Outpatient Medications  Medication Sig Dispense Refill  . albuterol (VENTOLIN HFA) 108 (90 Base) MCG/ACT inhaler Inhale 2 puffs into the lungs every 6 (six) hours as needed for wheezing or shortness of breath. 3 Inhaler 0  . aspirin 81 MG EC tablet Take 81 mg by mouth daily.      Marland Kitchen  atorvastatin (LIPITOR) 40 MG tablet TAKE 1 TABLET BY MOUTH AT BEDTIME 90 tablet 0  . cloNIDine (CATAPRES) 0.1 MG tablet Take 1 tablet (0.1 mg total) by mouth 2 (two) times daily. 180 tablet 1  . conjugated estrogens (PREMARIN) vaginal cream Place 1 Applicatorful vaginally daily. Apply 0.5mg  (pea-sized amount)  just inside the vaginal introitus with a finger-tip every night for two weeks and then Monday, Wednesday and Friday nights. 30 g 12  . cyclobenzaprine (FLEXERIL) 5 MG tablet Take 5 mg by mouth 3 (three) times daily as needed.   0  . diclofenac (CATAFLAM) 50 MG tablet Take 1 tablet by mouth three times daily as needed 60 tablet 0  . DULoxetine (CYMBALTA) 30 MG capsule Take 3 capsules (90 mg total) by mouth daily. 270 capsule 1  . EQ ALLERGY RELIEF 10 MG tablet TAKE ONE TABLET BY MOUTH ONCE DAILY 90 tablet 1  . fluticasone (FLONASE) 50 MCG/ACT nasal spray Place 2 sprays into both nostrils daily.     Marland Kitchen gabapentin (NEURONTIN) 400 MG capsule 1 capsule in the morning, 2 in the evening 270 capsule 1  . glucose blood test strip Use once daily. 100 each 12  . hydrOXYzine (ATARAX/VISTARIL) 25 MG tablet Take 1 tablet (25 mg total) by mouth 3 (three) times daily as needed (for severe anxiety only). 270 tablet 1  . levothyroxine (SYNTHROID) 112 MCG tablet Take 1 tablet (112 mcg total) by mouth daily. 90 tablet 0  . metoprolol succinate (TOPROL-XL) 50 MG 24 hr tablet Take 1  tablet (50 mg total) by mouth daily. Take with or immediately following a meal. 90 tablet 0  . mometasone (ASMANEX, 120 METERED DOSES,) 220 MCG/INH inhaler Inhale 2 puffs into the lungs daily. 3 Inhaler 1  . montelukast (SINGULAIR) 10 MG tablet Take 1 tablet (10 mg total) by mouth at bedtime. 90 tablet 1  . nitroGLYCERIN (NITROSTAT) 0.4 MG SL tablet Place 1 tablet (0.4 mg total) under the tongue every 5 (five) minutes as needed. 25 tablet 0  . olmesartan-hydrochlorothiazide (BENICAR HCT) 40-25 MG tablet Take 1 tablet by mouth daily. 90 tablet 0  . omeprazole (PRILOSEC) 40 MG capsule TAKE 1 CAPSULE BY MOUTH DAILY 90 capsule 0  . Plecanatide (TRULANCE) 3 MG TABS Take 1 tablet by mouth daily. In place of linzess 90 tablet 1  . QUEtiapine (SEROQUEL) 50 MG tablet Take 1-1.5 tablets (50-75 mg total) by mouth at bedtime. 135 tablet 1  . rizatriptan (MAXALT) 10 MG tablet TAKE 1 TABLET BY MOUTH THREE TIMES DAILY AS NEEDED FOR  MIGRAINE 10 tablet 0  . Trospium Chloride 60 MG CP24 Take 1 capsule (60 mg total) by mouth daily. 90 each 3   No current facility-administered medications for this visit.      Musculoskeletal: Strength & Muscle Tone: UTA Gait & Station: Reports as wnl Patient leans: N/A  Psychiatric Specialty Exam: Review of Systems  Psychiatric/Behavioral: The patient is not nervous/anxious.   All other systems reviewed and are negative.   There were no vitals taken for this visit.There is no height or weight on file to calculate BMI.  General Appearance: UTA  Eye Contact:  UTA  Speech:  Clear and Coherent  Volume:  Normal  Mood:  Euthymic  Affect:  UTA  Thought Process:  Goal Directed and Descriptions of Associations: Intact  Orientation:  Full (Time, Place, and Person)  Thought Content: Logical   Suicidal Thoughts:  No  Homicidal Thoughts:  No  Memory:  Immediate;  Fair Recent;   Fair Remote;   Fair  Judgement:  Fair  Insight:  Fair  Psychomotor Activity:  UTA   Concentration:  Concentration: Fair and Attention Span: Fair  Recall:  [employer] of Knowledge: Fair  Language: Fair  Akathisia:  No  Handed:  Right  AIMS (if indicated): Denies tremors, rigidity  Assets:  Communication Skills Desire for Improvement Housing  ADL's:  Intact  Cognition: WNL  Sleep:  Fair   Screenings: GAD-7     [facility] Visit from 03/19/2018 in [facility] Office Visit from 03/04/2016 in [facility] Office Visit from 12/20/2015 in [facility]  Total GAD-7 Score  14  18  18     PHQ2-9     Office Visit from 12/02/2018 in [facility] Office Visit from 11/27/2018 in [facility] Office Visit from 08/03/2018 in [facility] Nutrition from 04/14/2018 in [place] Office Visit from 03/19/2018 in [facility]  PHQ-2 Total Score  5  3  4  4  4   PHQ-9 Total Score  12  10  11  14  14        Assessment and Plan: Avril is a 58 year old African-American female who is married, lives in [place], has a history of MDD, anxiety, insomnia, OSA on CPAP, hypothyroidism, arthritis was evaluated by telemedicine today.  Patient does have psychosocial stressors of current COVID-19 outbreak however making progress on the current medication regimen.  She continues to have good social support system.  Plan MDD-improving Cymbalta as prescribed Seroquel 50 to 75 mg p.o. daily.  For GAD-improving Cymbalta 90 mg p.o. daily Continue hydroxyzine 25 mg p.o. 3 times daily as needed  Follow-up in clinic in 2 to 3 months or sooner if needed.  September 9 at 10:30 AM  I have spent atleast 15 minutes non - face to face with patient today. More than 50 % of the time was spent for psychoeducation and supportive psychotherapy and care coordination.  This note was generated in part or whole with voice recognition software. Voice recognition is usually  quite accurate but there are transcription errors that can and very often do occur. I apologize for any typographical errors that were not detected and corrected.          Ursula Alert, MD 02/04/2019, 7:41 PM

## 2019-02-06 ENCOUNTER — Other Ambulatory Visit: Payer: Self-pay | Admitting: Family Medicine

## 2019-02-06 DIAGNOSIS — R232 Flushing: Secondary | ICD-10-CM

## 2019-02-10 ENCOUNTER — Other Ambulatory Visit: Payer: Self-pay | Admitting: Family Medicine

## 2019-02-10 DIAGNOSIS — I152 Hypertension secondary to endocrine disorders: Secondary | ICD-10-CM

## 2019-02-10 DIAGNOSIS — E1159 Type 2 diabetes mellitus with other circulatory complications: Secondary | ICD-10-CM

## 2019-02-10 DIAGNOSIS — E034 Atrophy of thyroid (acquired): Secondary | ICD-10-CM

## 2019-02-10 DIAGNOSIS — E785 Hyperlipidemia, unspecified: Secondary | ICD-10-CM

## 2019-02-10 NOTE — Telephone Encounter (Signed)
appt made

## 2019-02-15 ENCOUNTER — Other Ambulatory Visit: Payer: Self-pay

## 2019-02-15 ENCOUNTER — Ambulatory Visit: Payer: BLUE CROSS/BLUE SHIELD | Admitting: Licensed Clinical Social Worker

## 2019-03-15 ENCOUNTER — Ambulatory Visit (INDEPENDENT_AMBULATORY_CARE_PROVIDER_SITE_OTHER): Payer: BLUE CROSS/BLUE SHIELD | Admitting: Licensed Clinical Social Worker

## 2019-03-15 ENCOUNTER — Other Ambulatory Visit: Payer: Self-pay

## 2019-03-15 DIAGNOSIS — F331 Major depressive disorder, recurrent, moderate: Secondary | ICD-10-CM | POA: Diagnosis not present

## 2019-03-15 DIAGNOSIS — F411 Generalized anxiety disorder: Secondary | ICD-10-CM

## 2019-03-15 NOTE — Progress Notes (Signed)
Virtual Visit via Telephone Note  I connected with Beverly Barrett on 03/15/19 at 11:00 AM EDT by telephone and verified that I am speaking with the correct person using two identifiers.  Location: Patient: home Provider: office   I discussed the limitations, risks, security and privacy concerns of performing an evaluation and management service by telephone and the availability of in person appointments. I also discussed with the patient that there may be a patient responsible charge related to this service. The patient expressed understanding and agreed to proceed.     I discussed the assessment and treatment plan with the patient. The patient was provided an opportunity to ask questions and all were answered. The patient agreed with the plan and demonstrated an understanding of the instructions.   The patient was advised to call back or seek an in-person evaluation if the symptoms worsen or if the condition fails to improve as anticipated.    THERAPIST PROGRESS NOTE  Session Time: 23mn  Participation Level: Active  Behavioral Response: CasualAlertAnxious  Type of Therapy: Individual Therapy  Treatment Goals addressed: Anxiety  Interventions: CBT  Summary: Beverly Barrett a 58y.o. female who presents with continues symptoms of her diagnosis.  Therapist met with Patient in an outpatient setting to assess current mood and assist with making progress towards goals through the use of therapeutic intervention. Therapist did a brief mood check, assessing anger, fear, disgust, excitement, happiness, and sadness.  Patient reports that she has been "managing to get by." Therapist utilized [email or web address].[email or web address] recognizing anxiety to assist with behavioral change.  Suicidal/Homicidal: No   Plan: Return again in 4 weeks.  Diagnosis: Axis I: Generalized Anxiety Disorder    Axis II: No diagnosis   I provided 30 minutes of non-face-to-face time during this encounter.   NLubertha South LCSW

## 2019-04-07 ENCOUNTER — Ambulatory Visit: Payer: BLUE CROSS/BLUE SHIELD | Admitting: Neurology

## 2019-04-07 ENCOUNTER — Ambulatory Visit (INDEPENDENT_AMBULATORY_CARE_PROVIDER_SITE_OTHER): Payer: BLUE CROSS/BLUE SHIELD | Admitting: Psychiatry

## 2019-04-07 ENCOUNTER — Encounter: Payer: Self-pay | Admitting: Neurology

## 2019-04-07 ENCOUNTER — Encounter: Payer: Self-pay | Admitting: Psychiatry

## 2019-04-07 ENCOUNTER — Other Ambulatory Visit: Payer: Self-pay

## 2019-04-07 VITALS — BP 126/64 | HR 77 | Temp 97.8°F | Ht 62.0 in | Wt 265.1 lb

## 2019-04-07 DIAGNOSIS — M5442 Lumbago with sciatica, left side: Secondary | ICD-10-CM | POA: Diagnosis not present

## 2019-04-07 DIAGNOSIS — F411 Generalized anxiety disorder: Secondary | ICD-10-CM | POA: Diagnosis not present

## 2019-04-07 DIAGNOSIS — F331 Major depressive disorder, recurrent, moderate: Secondary | ICD-10-CM | POA: Insufficient documentation

## 2019-04-07 DIAGNOSIS — G8929 Other chronic pain: Secondary | ICD-10-CM

## 2019-04-07 DIAGNOSIS — G43019 Migraine without aura, intractable, without status migrainosus: Secondary | ICD-10-CM

## 2019-04-07 MED ORDER — RIZATRIPTAN BENZOATE 10 MG PO TABS: ORAL_TABLET | ORAL | 3 refills | Status: DC

## 2019-04-07 MED ORDER — GABAPENTIN 400 MG PO CAPS: ORAL_CAPSULE | ORAL | 1 refills | Status: DC

## 2019-04-07 NOTE — Progress Notes (Signed)
Virtual Visit via Telephone Note  I connected with Beverly Barrett on 04/07/19 at 10:30 AM EDT by telephone and verified that I am speaking with the correct person using two identifiers.   I discussed the limitations, risks, security and privacy concerns of performing an evaluation and management service by telephone and the availability of in person appointments. I also discussed with the patient that there may be a patient responsible charge related to this service. The patient expressed understanding and agreed to proceed.    I discussed the assessment and treatment plan with the patient. The patient was provided an opportunity to ask questions and all were answered. The patient agreed with the plan and demonstrated an understanding of the instructions.   The patient was advised to call back or seek an in-person evaluation if the symptoms worsen or if the condition fails to improve as anticipated.   [facility] [facility] MD OP Progress Note  04/07/2019 12:37 PM [name]  MRN:  [id number]  Chief Complaint:  Chief Complaint    Follow-up     HPI: Beverly Barrett is a 58 year old African-American female, married, lives in [place], has a history of MDD, GAD, arthritis, chronic pain, history of CPAP, migraine headaches, hypothyroidism was evaluated by telemedicine today.  Patient preferred to do a phone call.  Patient today reports she recently had some worsening migraine headaches and was at her neurologist to be evaluated this a.m.  She reports her medication gabapentin was readjusted to a higher dosage.  She reports her mood symptoms as fair.  She denies any significant depression or anxiety.  She reports sleep is good.  Patient denies any suicidality, homicidality or perceptual disturbances.  Patient reports she continues to have good social support system.  She denies any other concerns today. Visit Diagnosis:    ICD-10-CM   1. MDD (major depressive disorder), recurrent episode, moderate  (HCC)  F33.1   2. GAD (generalized anxiety disorder)  F41.1     Past Psychiatric History: I have reviewed past psychiatric history from my progress note on 01/21/2018  Past Medical History:  Past Medical History:  Diagnosis Date  . Anginal pain ([place])    none in approx 2 yrs  . Anxiety   . Asthma   . Chronic lower back pain   . Common migraine with intractable migraine 09/20/2016  . Depression    suicidal ideation  . Diabetes mellitus without complication ([place])   . GERD (gastroesophageal reflux disease)   . Headache    migraines -3x/wk  . Hyperlipidemia   . Hypertension   . Hypothyroidism   . Motion sickness    cars  . Multilevel degenerative disc disease   . Shortness of breath dyspnea   . Sleep apnea    CPAP  . Vertigo   . Wears dentures    partial upper    Past Surgical History:  Procedure Laterality Date  . ABDOMINAL HYSTERECTOMY    . bladder tach  1995  . CESAREAN SECTION    . COLONOSCOPY WITH PROPOFOL N/A 05/29/2015   Procedure: COLONOSCOPY WITH PROPOFOL;  Surgeon: Lucilla Lame, MD;  Location: [facility];  Service: Endoscopy;  Laterality: N/A;  Latex allergy sleep apnea - no CPAP machine (yet)  . ESOPHAGOGASTRODUODENOSCOPY N/A 04/23/2017   Procedure: ESOPHAGOGASTRODUODENOSCOPY (EGD);  Surgeon: Lin Landsman, MD;  Location: [facility];  Service: Gastroenterology;  Laterality: N/A;  Latex allergy sleep apnea  . POLYPECTOMY  05/29/2015   Procedure: POLYPECTOMY;  Surgeon: Lucilla Lame, MD;  Location: [facility];  Service: Endoscopy;;    Family Psychiatric History: I have reviewed family psychiatric history from my progress note on 01/21/2018  Family History:  Family History  Problem Relation Age of Onset  . Diabetes Sister   . Anxiety disorder Sister   . Depression Sister   . Hypertension Sister   . Cirrhosis Mother   . Diabetes Mother   . Cirrhosis Father   . Breast cancer Sister 56  . Heart attack Brother   . Alcohol abuse  Brother   . Drug abuse Brother   . Prostate cancer Neg Hx   . Kidney cancer Neg Hx   . Bladder Cancer Neg Hx     Social History: I have reviewed social history from my progress note on 01/21/2018 Social History   Socioeconomic History  . Marital status: Married    Spouse name: clayton sr  . Number of children: 3  . Years of education: Not on file  . Highest education level: GED or equivalent  Occupational History  . Not on file  Social Needs  . Financial resource strain: Very hard  . Food insecurity    Worry: Often true    Inability: Often true  . Transportation needs    Medical: No    Non-medical: No  Tobacco Use  . Smoking status: Never Smoker  . Smokeless tobacco: Never Used  Substance and Sexual Activity  . Alcohol use: No    Alcohol/week: 0.0 standard drinks  . Drug use: No  . Sexual activity: Yes    Partners: Male  Lifestyle  . Physical activity    Days per week: 2 days    Minutes per session: 30 min  . Stress: Very much  Relationships  . Social Herbalist on phone: Three times a week    Gets together: Once a week    Attends religious service: More than 4 times per year    Active member of club or organization: No    Attends meetings of clubs or organizations: Never    Relationship status: Married  Other Topics Concern  . Not on file  Social History Narrative   Lives with husband and one daughter other kids are out of the house   She does not work, husband on disability     Allergies:  Allergies  Allergen Reactions  . Latex Swelling    Pt unsure of reaction pt states something happened while in the hospital after surgery.    Metabolic Disorder Labs: Lab Results  Component Value Date   HGBA1C 6.9 (H) 12/09/2018   MPG 151 12/09/2018   MPG 143 08/18/2017   No results found for: PROLACTIN Lab Results  Component Value Date   CHOL 159 12/09/2018   TRIG 305 (H) 12/09/2018   HDL 38 (L) 12/09/2018   CHOLHDL 4.2 12/09/2018   VLDL 44 (H)  02/12/2017   LDLCALC 84 12/09/2018   LDLCALC 85 03/23/2018   Lab Results  Component Value Date   TSH 2.74 10/26/2018   TSH 7.94 (H) 09/08/2018    Therapeutic Level Labs: No results found for: LITHIUM No results found for: VALPROATE No components found for:  CBMZ  Current Medications: Current Outpatient Medications  Medication Sig Dispense Refill  . albuterol (VENTOLIN HFA) 108 (90 Base) MCG/ACT inhaler Inhale 2 puffs into the lungs every 6 (six) hours as needed for wheezing or shortness of breath. 3 Inhaler 0  . aspirin 81 MG EC tablet Take 81 mg by mouth  daily.      . atorvastatin (LIPITOR) 40 MG tablet TAKE 1 TABLET BY MOUTH AT BEDTIME 90 tablet 0  . cloNIDine (CATAPRES) 0.1 MG tablet TAKE 1 TABLET BY MOUTH TWICE DAILY. START AT NIGHT FOR HOTFLASHES AND TRY TAKING TWICE DAILY IF TOLERATED 180 tablet 0  . conjugated estrogens (PREMARIN) vaginal cream Place 1 Applicatorful vaginally daily. Apply 0.5mg  (pea-sized amount)  just inside the vaginal introitus with a finger-tip every night for two weeks and then Monday, Wednesday and Friday nights. 30 g 12  . cyclobenzaprine (FLEXERIL) 5 MG tablet Take 5 mg by mouth 3 (three) times daily as needed.   0  . DULoxetine (CYMBALTA) 30 MG capsule Take 3 capsules (90 mg total) by mouth daily. 270 capsule 1  . EQ ALLERGY RELIEF 10 MG tablet TAKE ONE TABLET BY MOUTH ONCE DAILY 90 tablet 1  . fluticasone (FLONASE) 50 MCG/ACT nasal spray Place 2 sprays into both nostrils daily.     Marland Kitchen gabapentin (NEURONTIN) 400 MG capsule 1 capsule in the morning and at midday, 2 in the evening 360 capsule 1  . glucose blood test strip Use once daily. 100 each 12  . hydrOXYzine (ATARAX/VISTARIL) 25 MG tablet Take 1 tablet (25 mg total) by mouth 3 (three) times daily as needed (for severe anxiety only). 270 tablet 1  . levothyroxine (SYNTHROID) 112 MCG tablet TAKE 1 TABLET BY MOUTH DAILY GENERIC EQUIVALENT FOR SYNTHROID 90 tablet 0  . metoprolol succinate (TOPROL-XL) 50  MG 24 hr tablet TAKE 1 TABLET BY MOUTH DAILY 90 tablet 0  . mometasone (ASMANEX, 120 METERED DOSES,) 220 MCG/INH inhaler Inhale 2 puffs into the lungs daily. 3 Inhaler 1  . montelukast (SINGULAIR) 10 MG tablet Take 1 tablet (10 mg total) by mouth at bedtime. 90 tablet 1  . nitroGLYCERIN (NITROSTAT) 0.4 MG SL tablet Place 1 tablet (0.4 mg total) under the tongue every 5 (five) minutes as needed. 25 tablet 0  . olmesartan-hydrochlorothiazide (BENICAR HCT) 40-25 MG tablet TAKE 1 TABLET BY MOUTH DAILY GENERIC EQUIVALENT FOR BENICART HCT 90 tablet 0  . omeprazole (PRILOSEC) 40 MG capsule TAKE 1 CAPSULE BY MOUTH DAILY 90 capsule 0  . Plecanatide (TRULANCE) 3 MG TABS Take 1 tablet by mouth daily. In place of linzess 90 tablet 1  . QUEtiapine (SEROQUEL) 50 MG tablet Take 1-1.5 tablets (50-75 mg total) by mouth at bedtime. 135 tablet 1  . rizatriptan (MAXALT) 10 MG tablet TAKE 1 TABLET BY MOUTH THREE TIMES DAILY AS NEEDED FOR  MIGRAINE 10 tablet 3  . Trospium Chloride 60 MG CP24 Take 1 capsule (60 mg total) by mouth daily. 90 each 3   No current facility-administered medications for this visit.      Musculoskeletal: Strength & Muscle Tone: UTA Gait & Station: Reports as WNL Patient leans: N/A  Psychiatric Specialty Exam: Review of Systems  Neurological: Positive for headaches.  Psychiatric/Behavioral: The patient is nervous/anxious.   All other systems reviewed and are negative.   There were no vitals taken for this visit.There is no height or weight on file to calculate BMI.  General Appearance: UTA  Eye Contact:  UTA  Speech:  Clear and Coherent  Volume:  Normal  Mood:  Anxious improving  Affect:  UTA  Thought Process:  Goal Directed and Descriptions of Associations: Intact  Orientation:  Full (Time, Place, and Person)  Thought Content: Logical   Suicidal Thoughts:  No  Homicidal Thoughts:  No  Memory:  Immediate;   Fair  Recent;   Fair Remote;   Fair  Judgement:  Fair  Insight:   Fair  Psychomotor Activity:  UTA  Concentration:  Concentration: Fair and Attention Span: Fair  Recall:  [employer] of Knowledge: Fair  Language: Fair  Akathisia:  No  Handed:  Right  AIMS (if indicated):Denies tremors, rigidity  Assets:  Communication Skills Desire for Improvement Housing  ADL's:  Intact  Cognition: WNL  Sleep:  Fair   Screenings: GAD-7     Office Visit from 03/19/2018 in [facility] Office Visit from 03/04/2016 in [facility] Office Visit from 12/20/2015 in [facility]  Total GAD-7 Score  14  18  18     PHQ2-9     Office Visit from 12/02/2018 in [facility] Office Visit from 11/27/2018 in [facility] Office Visit from 08/03/2018 in [facility] Nutrition from 04/14/2018 in [facility] Office Visit from 03/19/2018 in [facility]  PHQ-2 Total Score  5  3  4  4  4   PHQ-9 Total Score  12  10  11  14  14        Assessment and Plan: Yunique is a 58 year old African-American female who is married, lives in [place], has a history of MDD, GAD, insomnia, OSA on CPAP, migraine headaches, hypothyroidism, arthritis was evaluated by telemedicine today.  Patient with psychosocial stressors of current COVID-19 outbreak.  She continues to benefit from medications and is currently making progress.  Plan MDD-stable Cymbalta as prescribed Seroquel 50 to 75 mg p.o. nightly  GAD-stable Cymbalta 90 mg p.o. daily Hydroxyzine 25 mg p.o. 3 times daily as needed  Follow-up in clinic in 2 to 3 months or sooner if needed.  November 18 at 10 AM  I have spent atleast 15 minutes non face to face with patient today. More than 50 % of the time was spent for psychoeducation and supportive psychotherapy and care coordination. This note was generated in part or whole with voice recognition software. Voice recognition is usually quite accurate  but there are transcription errors that can and very often do occur. I apologize for any typographical errors that were not detected and corrected.      Ursula Alert, MD 04/07/2019, 12:37 PM

## 2019-04-07 NOTE — Patient Instructions (Signed)
We will go up on the gabapentin 400 mg capsules to one in the morning and midday, 2 in the evening.   Neurontin (gabapentin) may result in drowsiness, ankle swelling, gait instability, or possibly dizziness. Please contact our office if significant side effects occur with this medication.

## 2019-04-07 NOTE — Progress Notes (Signed)
Reason for visit: Intractable headache, low back pain  Beverly Barrett is an 58 y.o. female  History of present illness:  Beverly Barrett is a 58 year old right-handed black female with a history of morbid obesity and low back pain.  She is followed through the [facility], she is followed through orthopedic surgery and she gets injections in her back.  She continues to have a lot of back pain and pain down the left leg, she uses a cane for ambulation.  She has not had any falls since last seen.  She continues to have headaches, the headaches initially responded quite well to gabapentin, she was only having about 1 headache a week.  Now the headaches are becoming more frequent and she may be having 2 or 3 a week.  She notes that bright lights and weather changes and not eating well may bring on a headache.  She may get photophobia and phonophobia with the headache and have nausea.  She was to be set up for physical therapy for her back when last seen, this never happened.  She has been given exercises through her orthopedic doctor to help her back.  She returns for an evaluation.  In the past, the patient has been on Topamax, she remains on Cymbalta, she takes Flexeril, she has been on Seroquel and metoprolol as well as the gabapentin.  The headaches remain an issue.   Past Medical History:  Diagnosis Date  . Anginal pain ([place])    none in approx 2 yrs  . Anxiety   . Asthma   . Chronic lower back pain   . Common migraine with intractable migraine 09/20/2016  . Depression    suicidal ideation  . Diabetes mellitus without complication ([place])   . GERD (gastroesophageal reflux disease)   . Headache    migraines -3x/wk  . Hyperlipidemia   . Hypertension   . Hypothyroidism   . Motion sickness    cars  . Multilevel degenerative disc disease   . Shortness of breath dyspnea   . Sleep apnea    CPAP  . Vertigo   . Wears dentures    partial upper    Past Surgical History:  Procedure  Laterality Date  . ABDOMINAL HYSTERECTOMY    . bladder tach  1995  . CESAREAN SECTION    . COLONOSCOPY WITH PROPOFOL N/A 05/29/2015   Procedure: COLONOSCOPY WITH PROPOFOL;  Surgeon: Lucilla Lame, MD;  Location: [facility];  Service: Endoscopy;  Laterality: N/A;  Latex allergy sleep apnea - no CPAP machine (yet)  . ESOPHAGOGASTRODUODENOSCOPY N/A 04/23/2017   Procedure: ESOPHAGOGASTRODUODENOSCOPY (EGD);  Surgeon: Lin Landsman, MD;  Location: [employer];  Service: Gastroenterology;  Laterality: N/A;  Latex allergy sleep apnea  . POLYPECTOMY  05/29/2015   Procedure: POLYPECTOMY;  Surgeon: Lucilla Lame, MD;  Location: [facility];  Service: Endoscopy;;    Family History  Problem Relation Age of Onset  . Diabetes Sister   . Anxiety disorder Sister   . Depression Sister   . Hypertension Sister   . Cirrhosis Mother   . Diabetes Mother   . Cirrhosis Father   . Breast cancer Sister 17  . Heart attack Brother   . Alcohol abuse Brother   . Drug abuse Brother   . Prostate cancer Neg Hx   . Kidney cancer Neg Hx   . Bladder Cancer Neg Hx     Social history:  reports that she has never smoked. She has never  used smokeless tobacco. She reports that she does not drink alcohol or use drugs.    Allergies  Allergen Reactions  . Latex Swelling    Pt unsure of reaction pt states something happened while in the hospital after surgery.    Medications:  Prior to Admission medications   Medication Sig Start Date End Date Taking? Authorizing Provider  albuterol (VENTOLIN HFA) 108 (90 Base) MCG/ACT inhaler Inhale 2 puffs into the lungs every 6 (six) hours as needed for wheezing or shortness of breath. 08/03/18  Yes Steele Sizer, MD  aspirin 81 MG EC tablet Take 81 mg by mouth daily.     Yes [provider]  atorvastatin (LIPITOR) 40 MG tablet TAKE 1 TABLET BY MOUTH AT BEDTIME 02/10/19  Yes Sowles, Drue Stager, MD  cloNIDine (CATAPRES) 0.1 MG tablet TAKE 1 TABLET  BY MOUTH TWICE DAILY. START AT NIGHT FOR HOTFLASHES AND TRY TAKING TWICE DAILY IF TOLERATED 02/07/19  Yes Sowles, Drue Stager, MD  conjugated estrogens (PREMARIN) vaginal cream Place 1 Applicatorful vaginally daily. Apply 0.5mg  (pea-sized amount)  just inside the vaginal introitus with a finger-tip every night for two weeks and then Monday, Wednesday and Friday nights. 02/24/18  Yes Hollice Espy, MD  cyclobenzaprine (FLEXERIL) 5 MG tablet Take 5 mg by mouth 3 (three) times daily as needed.  04/18/17  Yes Duanne Guess, PA-C  DULoxetine (CYMBALTA) 30 MG capsule Take 3 capsules (90 mg total) by mouth daily. 12/04/18  Yes Ursula Alert, MD  EQ ALLERGY RELIEF 10 MG tablet TAKE ONE TABLET BY MOUTH ONCE DAILY 07/25/15  Yes Bobetta Lime, MD  fluticasone (FLONASE) 50 MCG/ACT nasal spray Place 2 sprays into both nostrils daily.  04/11/14  Yes [provider]  gabapentin (NEURONTIN) 400 MG capsule 1 capsule in the morning, 2 in the evening 12/31/18  Yes Kathrynn Ducking, MD  glucose blood test strip Use once daily. 08/03/18  Yes Sowles, Drue Stager, MD  hydrOXYzine (ATARAX/VISTARIL) 25 MG tablet Take 1 tablet (25 mg total) by mouth 3 (three) times daily as needed (for severe anxiety only). 02/04/19  Yes Ursula Alert, MD  levothyroxine (SYNTHROID) 112 MCG tablet TAKE 1 TABLET BY MOUTH DAILY GENERIC EQUIVALENT FOR SYNTHROID 02/10/19  Yes Sowles, Drue Stager, MD  metoprolol succinate (TOPROL-XL) 50 MG 24 hr tablet TAKE 1 TABLET BY MOUTH DAILY 02/10/19  Yes Sowles, Drue Stager, MD  mometasone (ASMANEX, 120 METERED DOSES,) 220 MCG/INH inhaler Inhale 2 puffs into the lungs daily. 08/03/18  Yes Sowles, Drue Stager, MD  montelukast (SINGULAIR) 10 MG tablet Take 1 tablet (10 mg total) by mouth at bedtime. 12/02/18  Yes Sowles, Drue Stager, MD  nitroGLYCERIN (NITROSTAT) 0.4 MG SL tablet Place 1 tablet (0.4 mg total) under the tongue every 5 (five) minutes as needed. 02/20/17  Yes Minna Merritts, MD  olmesartan-hydrochlorothiazide  (BENICAR HCT) 40-25 MG tablet TAKE 1 TABLET BY MOUTH DAILY GENERIC EQUIVALENT FOR BENICART HCT 02/10/19  Yes Sowles, Drue Stager, MD  omeprazole (PRILOSEC) 40 MG capsule TAKE 1 CAPSULE BY MOUTH DAILY 01/29/19  Yes Sowles, Drue Stager, MD  Plecanatide (TRULANCE) 3 MG TABS Take 1 tablet by mouth daily. In place of linzess 12/02/18  Yes Sowles, Drue Stager, MD  QUEtiapine (SEROQUEL) 50 MG tablet Take 1-1.5 tablets (50-75 mg total) by mouth at bedtime. 12/04/18  Yes Eappen, Ria Clock, MD  rizatriptan (MAXALT) 10 MG tablet TAKE 1 TABLET BY MOUTH THREE TIMES DAILY AS NEEDED FOR  MIGRAINE 10/22/18  Yes Kathrynn Ducking, MD  Trospium Chloride 60 MG CP24 Take 1 capsule (60  mg total) by mouth daily. 03/17/18  Yes McGowan, Larene Beach A, PA-C    ROS:  Out of a complete 14 system review of symptoms, the patient complains only of the following symptoms, and all other reviewed systems are negative.  Low back pain Walking problems Headache Nausea  Blood pressure 126/64, pulse 77, temperature 97.8 F (36.6 C), temperature source Temporal, height 5\' 2"  (1.575 m), weight 265 lb 2 oz (120.3 kg), SpO2 96 %.  Physical Exam  General: The patient is alert and cooperative at the time of the examination.  The patient is morbidly obese.  Neuromuscular: The patient is only able to flex about 45 degrees with the low back.  Skin: No significant peripheral edema is noted.   Neurologic Exam  Mental status: The patient is alert and oriented x 3 at the time of the examination. The patient has apparent normal recent and remote memory, with an apparently normal attention span and concentration ability.   Cranial nerves: Facial symmetry is present. Speech is normal, no aphasia or dysarthria is noted. Extraocular movements are full. Visual fields are full.  Motor: The patient has good strength in all 4 extremities.  Sensory examination: Soft touch sensation is symmetric on the face, arms, and legs.  Coordination: The patient has good  finger-nose-finger and heel-to-shin bilaterally.  Gait and station: The patient has the ability to walk independently, she normally uses a cane for ambulation.  Tandem gait is slightly unsteady.  Romberg is negative.  Reflexes: Deep tendon reflexes are symmetric, but are depressed.   Assessment/Plan:  1.  Chronic low back pain, left leg pain  2.  Frequent migraine headache  3.  Morbid obesity  The patient has started having more frequent headaches.  We will go up on the gabapentin dose taking 400 mg in the morning and at midday and 800 mg in the evening.  A prescription was sent in.  The patient is not getting any benefit within the next 4 to 6 weeks, she will call our office and we will consider a newer medication such as Aimovig or Ajovy for the headache.  She will follow-up here in 4 months.   Jill Alexanders MD 04/07/2019 9:33 AM  [facility] [address] [place], [place] [address]  Phone [phone] Fax [phone]

## 2019-04-12 ENCOUNTER — Ambulatory Visit: Payer: BLUE CROSS/BLUE SHIELD | Admitting: Licensed Clinical Social Worker

## 2019-04-14 ENCOUNTER — Ambulatory Visit (INDEPENDENT_AMBULATORY_CARE_PROVIDER_SITE_OTHER): Payer: BLUE CROSS/BLUE SHIELD | Admitting: Family Medicine

## 2019-04-14 ENCOUNTER — Encounter: Payer: Self-pay | Admitting: Family Medicine

## 2019-04-14 ENCOUNTER — Other Ambulatory Visit: Payer: Self-pay

## 2019-04-14 VITALS — BP 110/64 | HR 76 | Temp 96.9°F | Resp 16 | Ht 62.0 in | Wt 264.1 lb

## 2019-04-14 DIAGNOSIS — M545 Low back pain, unspecified: Secondary | ICD-10-CM

## 2019-04-14 DIAGNOSIS — E785 Hyperlipidemia, unspecified: Secondary | ICD-10-CM

## 2019-04-14 DIAGNOSIS — Z9181 History of falling: Secondary | ICD-10-CM

## 2019-04-14 DIAGNOSIS — Z23 Encounter for immunization: Secondary | ICD-10-CM | POA: Diagnosis not present

## 2019-04-14 DIAGNOSIS — K219 Gastro-esophageal reflux disease without esophagitis: Secondary | ICD-10-CM

## 2019-04-14 DIAGNOSIS — I152 Hypertension secondary to endocrine disorders: Secondary | ICD-10-CM

## 2019-04-14 DIAGNOSIS — L304 Erythema intertrigo: Secondary | ICD-10-CM

## 2019-04-14 DIAGNOSIS — I10 Essential (primary) hypertension: Secondary | ICD-10-CM

## 2019-04-14 DIAGNOSIS — E1169 Type 2 diabetes mellitus with other specified complication: Secondary | ICD-10-CM

## 2019-04-14 DIAGNOSIS — E034 Atrophy of thyroid (acquired): Secondary | ICD-10-CM

## 2019-04-14 DIAGNOSIS — J4520 Mild intermittent asthma, uncomplicated: Secondary | ICD-10-CM

## 2019-04-14 DIAGNOSIS — K5909 Other constipation: Secondary | ICD-10-CM

## 2019-04-14 DIAGNOSIS — R1030 Lower abdominal pain, unspecified: Secondary | ICD-10-CM | POA: Diagnosis not present

## 2019-04-14 DIAGNOSIS — E1159 Type 2 diabetes mellitus with other circulatory complications: Secondary | ICD-10-CM

## 2019-04-14 DIAGNOSIS — R232 Flushing: Secondary | ICD-10-CM

## 2019-04-14 LAB — POCT URINALYSIS DIPSTICK
Appearance: NORMAL
Bilirubin, UA: NEGATIVE
Blood, UA: NEGATIVE
Glucose, UA: NEGATIVE
Ketones, UA: NEGATIVE
Leukocytes, UA: NEGATIVE
Nitrite, UA: NEGATIVE
Odor: NORMAL
Protein, UA: NEGATIVE
Spec Grav, UA: 1.025 (ref 1.010–1.025)
Urobilinogen, UA: 0.2 E.U./dL
pH, UA: 6 (ref 5.0–8.0)

## 2019-04-14 LAB — POCT GLYCOSYLATED HEMOGLOBIN (HGB A1C): Hemoglobin A1C: 7.6 % — AB (ref 4.0–5.6)

## 2019-04-14 MED ORDER — CYCLOBENZAPRINE HCL 5 MG PO TABS: 5.0000 mg | ORAL_TABLET | Freq: Three times a day (TID) | ORAL | 0 refills | Status: DC | PRN

## 2019-04-14 MED ORDER — DICLOFENAC SODIUM 75 MG PO TBEC: 75.0000 mg | DELAYED_RELEASE_TABLET | Freq: Two times a day (BID) | ORAL | 0 refills | Status: DC

## 2019-04-14 MED ORDER — CLONIDINE HCL 0.1 MG PO TABS: 0.1000 mg | ORAL_TABLET | Freq: Every evening | ORAL | 1 refills | Status: DC

## 2019-04-14 MED ORDER — KETOCONAZOLE 2 % EX CREA: 1.0000 "application " | TOPICAL_CREAM | Freq: Every day | CUTANEOUS | 0 refills | Status: DC

## 2019-04-14 MED ORDER — METFORMIN HCL ER 750 MG PO TB24: 750.0000 mg | ORAL_TABLET | Freq: Every day | ORAL | 1 refills | Status: DC

## 2019-04-14 MED ORDER — MONTELUKAST SODIUM 10 MG PO TABS: 10.0000 mg | ORAL_TABLET | Freq: Every day | ORAL | 1 refills | Status: DC

## 2019-04-14 MED ORDER — ATORVASTATIN CALCIUM 40 MG PO TABS: 40.0000 mg | ORAL_TABLET | Freq: Every day | ORAL | 1 refills | Status: DC

## 2019-04-14 MED ORDER — OMEPRAZOLE 40 MG PO CPDR: 40.0000 mg | DELAYED_RELEASE_CAPSULE | Freq: Every day | ORAL | 1 refills | Status: DC

## 2019-04-14 MED ORDER — TRULANCE 3 MG PO TABS: 1.0000 | ORAL_TABLET | Freq: Every day | ORAL | 1 refills | Status: DC

## 2019-04-14 MED ORDER — OLMESARTAN MEDOXOMIL-HCTZ 40-25 MG PO TABS: 1.0000 | ORAL_TABLET | Freq: Every day | ORAL | 1 refills | Status: DC

## 2019-04-14 MED ORDER — ASMANEX (120 METERED DOSES) 220 MCG/INH IN AEPB: 2.0000 | INHALATION_SPRAY | Freq: Every day | RESPIRATORY_TRACT | 1 refills | Status: DC

## 2019-04-14 MED ORDER — LEVOTHYROXINE SODIUM 112 MCG PO TABS: 112.0000 ug | ORAL_TABLET | Freq: Every day | ORAL | 0 refills | Status: DC

## 2019-04-14 MED ORDER — METOPROLOL SUCCINATE ER 50 MG PO TB24: 50.0000 mg | ORAL_TABLET | Freq: Every day | ORAL | 1 refills | Status: DC

## 2019-04-14 NOTE — Progress Notes (Signed)
Name: Beverly Barrett   MRN: [id number]    DOB: 05/20/1961   Date:04/14/2019       Progress Note  Subjective  Chief Complaint  Chief Complaint  Patient presents with  . Hypertension  . Asthma  . Diabetes  . Depression  . Abdominal Pain    HPI  Diabetes type 2 with dyslipidemia in obese: she has a long history of metabolic syndrome. However hgbA1C gradually went up and back in January 2019 her A1C went to 6.6%, she has gone to DM teaching class, A1C is now above goal at 7.6% and we will start her on Metformin, discuss potential risk and importance of stopping medication if she needs IV contrast or symptoms of dehydration She denies polyphagia, but has chronic polyuriaandPolydipsia.She is on Atorvastatin.  Major Depression/GAD:She is now under the care of Dr. Shea Evans, weaning self off Alprazolam, taking hdyroxizine prn anxiety and seems to be doing better. phq 9 is down, on duloxetine 90 mg   HTN: she taking her medication BenicarHCTZ and Toprol XL.She denies dizziness, loses her balance when not using her cane, bp at home is 120's-130's, today it was lower in our office, but usually at goal, we will monitor and decrease dose of medication if needed.   Angina: had evaluation by cardiologist and Echo myoview in July normal result. She states chest pain still happens and at times during rest. She is on statin, aspirin, beta-blocker , ARB and has been taking NTG intermittently, but did not have to take any NTG since last visit. Pain is described as throbbing sensation on left side of chest and sometimes radiates to her left shoulder and back. Doing well at this time  Constipation: she was started on Linzess July 2017 and states has bowel movements daily , no straining or blood in stools, hemoccult negative April 2018. No side effects of medications, no longer having bleeding from hemorrhoids.Colonoscopy is up to date   OSA: using CPAP every night, all night, and we started  her on SeroquelShe is sleeping 6-7 hours, feeling rested most mornings.Unchanged   Morbid obesity:shestopped walking with her daughter since COVID-19, only walking in her yard now, discussed importance of eating a diabetic diet also .   GERD:Seen by Dr. Marius Ditch and has gastritis, advised to stop goody powdersbut is taking nsaid's given by Dr. Verdis Frederickson up with nausea, but improves once she gest up and takes Omeprazole and Dramamine. We will give her short course of diclofenac because of her acute back pain but not to take long term   Migraine: seen at the headache center by Dr. Jannifer Franklin, started on Topamax and Maxalt recently, still having headaches at this time, sheis off Topamax, and on gabapentin, but still having symptoms.She states headache is daily again.   Hypothyroidism she is currently taking medication daily, she has chronic dry skin and constipation ( she used to take Linzess but now on Trulance) , lastTSHwasback to normal range , she is nowon Synthroid 112 mcg daily, unchanged .   DDD lumbar spine: had MRI done, ordered by Ortho and was found to have DDD lumbar spine, she was seen by Dr. Phyllis Ginger but has not been back in a while, she developed acute onset of right lower back pain about one week ago, sometimes radiates to right flank, but no hematuria or urinary frequency. Symptoms this time started after she fell ( not using her cane and lost her balance) . The pain is described as aching and worse with movement, at times pain  can be intense and sharp, pain while not moving is 0/10   Frequent falls: uses a cane but skips at times, discussed PT for balance Asthma mild: long history,she has intermittent symptoms, occasionally has a dry coughand occasional wheezing. She is still using Asmanex, she uses ventolin prn only when active less than twice a week.   Iron deficiency anemia: last labs improved, she states she dropped off stool cards but I don't see results,  ferritin improved , anemia also improved   Intertrigo: under breast and under abdominal fold, it itches and feels irritated at times, she is using corn starches powder without help   Patient Active Problem List   Diagnosis Date Noted  . MDD (major depressive disorder), recurrent episode, moderate ([place]) 04/07/2019  . Chronic low back pain 12/31/2018  . Dental abscess 09/02/2018  . Grade II internal hemorrhoids 05/16/2017  . Mixed stress and urge urinary incontinence 11/12/2016  . Degenerative arthritis of lumbar spine 11/12/2016  . Common migraine with intractable migraine 09/20/2016  . Iron deficiency anemia 09/12/2016  . GAD (generalized anxiety disorder) 12/20/2015  . OSA on CPAP 12/20/2015  . Angina effort 12/20/2015  . Hyperglycemia 09/22/2015  . History of colonic polyps   . Benign neoplasm of sigmoid colon   . Frequency 04/14/2015  . Backache 04/14/2015  . Chronic pain of multiple joints 06/14/2014  . Dysmetabolic syndrome [id number]  . Acid reflux 06/14/2014  . Extreme obesity 06/14/2014  . Arthritis, degenerative 06/14/2014  . Morbid obesity with BMI of 40.0-44.9, adult ([place]) 06/14/2014  . Hypothyroidism due to acquired atrophy of thyroid 08/18/2013  . Hypothyroidism, unspecified 08/18/2013  . Hypertension goal BP (blood pressure) < 140/90 12/07/2010  . Familial multiple lipoprotein-type hyperlipidemia 12/07/2010  . CN (constipation) 11/21/2009  . Allergic rhinitis 10/13/2008  . Asthma, mild intermittent, well-controlled 09/01/2008  . Major depressive disorder, recurrent episode, in partial remission with anxious distress ([place]) 08/13/2007    Past Surgical History:  Procedure Laterality Date  . ABDOMINAL HYSTERECTOMY    . bladder tach  1995  . CESAREAN SECTION    . COLONOSCOPY WITH PROPOFOL N/A 05/29/2015   Procedure: COLONOSCOPY WITH PROPOFOL;  Surgeon: Lucilla Lame, MD;  Location: [facility];  Service: Endoscopy;  Laterality: N/A;  Latex allergy sleep  apnea - no CPAP machine (yet)  . ESOPHAGOGASTRODUODENOSCOPY N/A 04/23/2017   Procedure: ESOPHAGOGASTRODUODENOSCOPY (EGD);  Surgeon: Lin Landsman, MD;  Location: [facility];  Service: Gastroenterology;  Laterality: N/A;  Latex allergy sleep apnea  . POLYPECTOMY  05/29/2015   Procedure: POLYPECTOMY;  Surgeon: Lucilla Lame, MD;  Location: [facility];  Service: Endoscopy;;    Family History  Problem Relation Age of Onset  . Diabetes Sister   . Anxiety disorder Sister   . Depression Sister   . Hypertension Sister   . Cirrhosis Mother   . Diabetes Mother   . Cirrhosis Father   . Breast cancer Sister 15  . Heart attack Brother   . Alcohol abuse Brother   . Drug abuse Brother   . Prostate cancer Neg Hx   . Kidney cancer Neg Hx   . Bladder Cancer Neg Hx     Social History   Socioeconomic History  . Marital status: Married    Spouse name: clayton sr  . Number of children: 3  . Years of education: Not on file  . Highest education level: GED or equivalent  Occupational History  . Not on file  Social Needs  . Emergency planning/management officer  strain: Very hard  . Food insecurity    Worry: Often true    Inability: Often true  . Transportation needs    Medical: No    Non-medical: No  Tobacco Use  . Smoking status: Never Smoker  . Smokeless tobacco: Never Used  Substance and Sexual Activity  . Alcohol use: No    Alcohol/week: 0.0 standard drinks  . Drug use: No  . Sexual activity: Yes    Partners: Male  Lifestyle  . Physical activity    Days per week: 2 days    Minutes per session: 30 min  . Stress: Very much  Relationships  . Social Herbalist on phone: Three times a week    Gets together: Once a week    Attends religious service: More than 4 times per year    Active member of club or organization: No    Attends meetings of clubs or organizations: Never    Relationship status: Married  . Intimate partner violence    Fear of current or ex partner:  No    Emotionally abused: No    Physically abused: No    Forced sexual activity: No  Other Topics Concern  . Not on file  Social History Narrative   Lives with husband and one daughter other kids are out of the house   She does not work, husband on disability      Current Outpatient Medications:  .  albuterol (VENTOLIN HFA) 108 (90 Base) MCG/ACT inhaler, Inhale 2 puffs into the lungs every 6 (six) hours as needed for wheezing or shortness of breath., Disp: 3 Inhaler, Rfl: 0 .  aspirin 81 MG EC tablet, Take 81 mg by mouth daily.  , Disp: , Rfl:  .  atorvastatin (LIPITOR) 40 MG tablet, Take 1 tablet (40 mg total) by mouth at bedtime., Disp: 90 tablet, Rfl: 1 .  cloNIDine (CATAPRES) 0.1 MG tablet, Take 1 tablet (0.1 mg total) by mouth every evening., Disp: 90 tablet, Rfl: 1 .  conjugated estrogens (PREMARIN) vaginal cream, Place 1 Applicatorful vaginally daily. Apply 0.5mg  (pea-sized amount)  just inside the vaginal introitus with a finger-tip every night for two weeks and then Monday, Wednesday and Friday nights., Disp: 30 g, Rfl: 12 .  cyclobenzaprine (FLEXERIL) 5 MG tablet, Take 1 tablet (5 mg total) by mouth 3 (three) times daily as needed., Disp: 30 tablet, Rfl: 0 .  DULoxetine (CYMBALTA) 30 MG capsule, Take 3 capsules (90 mg total) by mouth daily., Disp: 270 capsule, Rfl: 1 .  EQ ALLERGY RELIEF 10 MG tablet, TAKE ONE TABLET BY MOUTH ONCE DAILY, Disp: 90 tablet, Rfl: 1 .  fluticasone (FLONASE) 50 MCG/ACT nasal spray, Place 2 sprays into both nostrils daily. , Disp: , Rfl:  .  gabapentin (NEURONTIN) 400 MG capsule, 1 capsule in the morning and at midday, 2 in the evening, Disp: 360 capsule, Rfl: 1 .  glucose blood test strip, Use once daily., Disp: 100 each, Rfl: 12 .  hydrOXYzine (ATARAX/VISTARIL) 25 MG tablet, Take 1 tablet (25 mg total) by mouth 3 (three) times daily as needed (for severe anxiety only)., Disp: 270 tablet, Rfl: 1 .  levothyroxine (SYNTHROID) 112 MCG tablet, Take 1 tablet  (112 mcg total) by mouth daily before breakfast., Disp: 90 tablet, Rfl: 0 .  metoprolol succinate (TOPROL-XL) 50 MG 24 hr tablet, Take 1 tablet (50 mg total) by mouth daily. Take with or immediately following a meal., Disp: 90 tablet, Rfl: 1 .  mometasone (ASMANEX, 120 METERED DOSES,) 220 MCG/INH inhaler, Inhale 2 puffs into the lungs daily., Disp: 3 Inhaler, Rfl: 1 .  montelukast (SINGULAIR) 10 MG tablet, Take 1 tablet (10 mg total) by mouth at bedtime., Disp: 90 tablet, Rfl: 1 .  nitroGLYCERIN (NITROSTAT) 0.4 MG SL tablet, Place 1 tablet (0.4 mg total) under the tongue every 5 (five) minutes as needed., Disp: 25 tablet, Rfl: 0 .  olmesartan-hydrochlorothiazide (BENICAR HCT) 40-25 MG tablet, Take 1 tablet by mouth daily., Disp: 90 tablet, Rfl: 1 .  omeprazole (PRILOSEC) 40 MG capsule, Take 1 capsule (40 mg total) by mouth daily., Disp: 90 capsule, Rfl: 1 .  Plecanatide (TRULANCE) 3 MG TABS, Take 1 tablet by mouth daily. In place of linzess, Disp: 90 tablet, Rfl: 1 .  QUEtiapine (SEROQUEL) 50 MG tablet, Take 1-1.5 tablets (50-75 mg total) by mouth at bedtime., Disp: 135 tablet, Rfl: 1 .  rizatriptan (MAXALT) 10 MG tablet, TAKE 1 TABLET BY MOUTH THREE TIMES DAILY AS NEEDED FOR  MIGRAINE, Disp: 10 tablet, Rfl: 3 .  Trospium Chloride 60 MG CP24, Take 1 capsule (60 mg total) by mouth daily., Disp: 90 each, Rfl: 3 .  diclofenac (VOLTAREN) 75 MG EC tablet, Take 1 tablet (75 mg total) by mouth 2 (two) times daily., Disp: 20 tablet, Rfl: 0 .  ketoconazole (NIZORAL) 2 % cream, Apply 1 application topically daily., Disp: 60 g, Rfl: 0 .  metFORMIN (GLUCOPHAGE-XR) 750 MG 24 hr tablet, Take 1 tablet (750 mg total) by mouth daily with breakfast., Disp: 90 tablet, Rfl: 1  Allergies  Allergen Reactions  . Latex Swelling    Pt unsure of reaction pt states something happened while in the hospital after surgery.    I personally reviewed active problem list, medication list, allergies, family history, social  history, health maintenance with the patient/caregiver today.   ROS  Constitutional: Negative for fever or weight change.  Respiratory: Negative for cough , positive for intermittent  shortness of breath.   Cardiovascular: Negative for chest pain or palpitations.  Gastrointestinal: Negative for abdominal pain, no bowel changes.  Musculoskeletal: Positive  for gait problem or joint swelling.  Skin: positive for rash.  Neurological:positive for intermittent  Dizziness and headache.  No other specific complaints in a complete review of systems (except as listed in HPI above).  Objective  Vitals:   04/14/19 1115 04/14/19 1206  BP: 102/78 110/64  Pulse: 76   Resp: 16   Temp: (!) 96.9 F (36.1 C)   TempSrc: Temporal   SpO2: 98%   Weight: 264 lb 1.6 oz (119.8 kg)   Height: 5\' 2"  (1.575 m)     Body mass index is 48.3 kg/m.  Physical Exam  Constitutional: Patient appears well-developed and well-nourished. Obese No distress.  HEENT: head atraumatic, normocephalic, pupils equal and reactive to light Cardiovascular: Normal rate, regular rhythm and normal heart sounds.  No murmur heard. No BLE edema. Pulmonary/Chest: Effort normal and breath sounds normal. No respiratory distress. Abdominal: Soft.  There is no tenderness. Muscular Skeletal: negative straight leg raise, pain with rom of spine, negative CVA tenderness, tender to palpation on right lower back  Psychiatric: Patient has a normal mood and affect. behavior is normal. Judgment and thought content normal.  Recent Results (from the past 2160 hour(s))  POCT Urinalysis Dipstick     Status: Normal   Collection Time: 04/14/19 11:19 AM  Result Value Ref Range   Color, UA Yellow    Clarity, UA Clear  Glucose, UA Negative Negative   Bilirubin, UA Negative    Ketones, UA Negative    Spec Grav, UA 1.025 1.010 - 1.025   Blood, UA Negative    pH, UA 6.0 5.0 - 8.0   Protein, UA Negative Negative   Urobilinogen, UA 0.2 0.2 or  1.0 E.U./dL   Nitrite, UA Negative    Leukocytes, UA Negative Negative   Appearance Normal    Odor Normal   POCT HgB A1C     Status: Abnormal   Collection Time: 04/14/19 11:23 AM  Result Value Ref Range   Hemoglobin A1C 7.6 (A) 4.0 - 5.6 %   HbA1c POC (<> result, manual entry)     HbA1c, POC (prediabetic range)     HbA1c, POC (controlled diabetic range)        PHQ2/9: Depression screen [facility] 2/9 04/14/2019 12/02/2018 11/27/2018 08/03/2018 04/14/2018  Decreased Interest 0 3 1 2 2   Down, Depressed, Hopeless 1 2 2 2 2   PHQ - 2 Score 1 5 3 4 4   Altered sleeping 1 1 1 1 1   Tired, decreased energy 1 2 2 2 2   Change in appetite 1 1 0 2 2  Feeling bad or failure about yourself  3 1 1 1 2   Trouble concentrating 0 1 1 0 0  Moving slowly or fidgety/restless 0 1 1 1 3   Suicidal thoughts 1 0 1 0 0  PHQ-9 Score 8 12 10 11 14   Difficult doing work/chores Very difficult Somewhat difficult Very difficult Very difficult -  Some recent data might be hidden    phq 9 is positive   Fall Risk: Fall Risk  04/14/2019 12/02/2018 11/27/2018 09/08/2018 08/03/2018  Falls in the past year? 1 1 0 0 0  Number falls in past yr: 1 0 0 0 0  Injury with Fall? 0 1 0 0 0  Comment - Knee Pain - - -  Follow up - - - - -     Functional Status Survey: Is the patient deaf or have difficulty hearing?: No Does the patient have difficulty seeing, even when wearing glasses/contacts?: No Does the patient have difficulty concentrating, remembering, or making decisions?: Yes Does the patient have difficulty walking or climbing stairs?: Yes Does the patient have difficulty dressing or bathing?: No Does the patient have difficulty doing errands alone such as visiting a doctor's office or shopping?: Yes   Assessment & Plan  1. Dyslipidemia associated with type 2 diabetes mellitus (HCC)  - POCT HgB A1C  2. Need for immunization against influenza  - Flu Vaccine QUAD 36+ mos IM  3. Lower abdominal pain  - POCT Urinalysis  Dipstick  4. Acute right-sided low back pain without sciatica  - cyclobenzaprine (FLEXERIL) 5 MG tablet; Take 1 tablet (5 mg total) by mouth 3 (three) times daily as needed.  Dispense: 30 tablet; Refill: 0  5. History of recent fall  Discussed fall prevention   6. Hyperlipidemia with target LDL less than 100  - atorvastatin (LIPITOR) 40 MG tablet; Take 1 tablet (40 mg total) by mouth at bedtime.  Dispense: 90 tablet; Refill: 1  7. Hot flashes  - cloNIDine (CATAPRES) 0.1 MG tablet; Take 1 tablet (0.1 mg total) by mouth every evening.  Dispense: 90 tablet; Refill: 1  8. Hypothyroidism due to acquired atrophy of thyroid  - levothyroxine (SYNTHROID) 112 MCG tablet; Take 1 tablet (112 mcg total) by mouth daily before breakfast.  Dispense: 90 tablet; Refill: 0  9. Hypertension associated  with type 2 diabetes mellitus (HCC)  - metoprolol succinate (TOPROL-XL) 50 MG 24 hr tablet; Take 1 tablet (50 mg total) by mouth daily. Take with or immediately following a meal.  Dispense: 90 tablet; Refill: 1 - olmesartan-hydrochlorothiazide (BENICAR HCT) 40-25 MG tablet; Take 1 tablet by mouth daily.  Dispense: 90 tablet; Refill: 1  10. Asthma, mild intermittent, well-controlled  - mometasone (ASMANEX, 120 METERED DOSES,) 220 MCG/INH inhaler; Inhale 2 puffs into the lungs daily.  Dispense: 3 Inhaler; Refill: 1 - montelukast (SINGULAIR) 10 MG tablet; Take 1 tablet (10 mg total) by mouth at bedtime.  Dispense: 90 tablet; Refill: 1  11. Gastroesophageal reflux disease without esophagitis  - omeprazole (PRILOSEC) 40 MG capsule; Take 1 capsule (40 mg total) by mouth daily.  Dispense: 90 capsule; Refill: 1  12. Chronic constipation  - Plecanatide (TRULANCE) 3 MG TABS; Take 1 tablet by mouth daily. In place of linzess  Dispense: 90 tablet; Refill: 1  13. Intertrigo  - ketoconazole (NIZORAL) 2 % cream; Apply 1 application topically daily.  Dispense: 60 g; Refill: 0

## 2019-04-18 ENCOUNTER — Encounter: Payer: Self-pay | Admitting: Family Medicine

## 2019-04-19 ENCOUNTER — Other Ambulatory Visit: Payer: Self-pay | Admitting: Urology

## 2019-04-23 ENCOUNTER — Other Ambulatory Visit: Payer: Self-pay | Admitting: Family Medicine

## 2019-04-23 DIAGNOSIS — K219 Gastro-esophageal reflux disease without esophagitis: Secondary | ICD-10-CM

## 2019-04-29 NOTE — Progress Notes (Deleted)
04/30/2019 11:17 AM   Beverly Barrett 01-06-1961 [id number]  Referring provider: Steele Sizer, MD [address] [place],  [place] [address]  No chief complaint on file.   HPI: 58 yo female with mixed urinary incontinence and vaginal atrophy who presents today for a yearly follow up.  Mixed urinary incontinence The patient has been experiencing urgency (4-7), frequency x 8 or more (worse), she is restricting fluid in an effort not to make the trips to the bathroom, she engages in toilet mapping, incontinence x 0-3 and nocturia x 0-3 (improved) and post void dribbling.  Patient denies any gross hematuria, dysuria or suprapubic/flank pain.  Patient denies any fevers, chills, nausea or vomiting.  Her PVR is 0 mL.  She feels the Cloyd Stagers is working and would like to stick with it.  She still feels the Myrbetriq was more effective, but it is cost prohibitive for her at this time.  Vaginal atrophy She is applying the vaginal cream 3 nights weekly. She's not had vaginal irritation or rash.  PMH: Past Medical History:  Diagnosis Date   Anginal pain ([place])    none in approx 2 yrs   Anxiety    Asthma    Chronic lower back pain    Common migraine with intractable migraine 09/20/2016   Depression    suicidal ideation   Diabetes mellitus without complication (HCC)    GERD (gastroesophageal reflux disease)    Headache    migraines -3x/wk   Hyperlipidemia    Hypertension    Hypothyroidism    Motion sickness    cars   Multilevel degenerative disc disease    Shortness of breath dyspnea    Sleep apnea    CPAP   Vertigo    Wears dentures    partial upper    Surgical History: Past Surgical History:  Procedure Laterality Date   ABDOMINAL HYSTERECTOMY     bladder tach  1995   CESAREAN SECTION     COLONOSCOPY WITH PROPOFOL N/A 05/29/2015   Procedure: COLONOSCOPY WITH PROPOFOL;  Surgeon: Lucilla Lame, MD;  Location: [facility];  Service:  Endoscopy;  Laterality: N/A;  Latex allergy sleep apnea - no CPAP machine (yet)   ESOPHAGOGASTRODUODENOSCOPY N/A 04/23/2017   Procedure: ESOPHAGOGASTRODUODENOSCOPY (EGD);  Surgeon: Lin Landsman, MD;  Location: [facility];  Service: Gastroenterology;  Laterality: N/A;  Latex allergy sleep apnea   POLYPECTOMY  05/29/2015   Procedure: POLYPECTOMY;  Surgeon: Lucilla Lame, MD;  Location: [facility];  Service: Endoscopy;;    Home Medications:  Allergies as of 04/30/2019      Reactions   Latex Swelling   Pt unsure of reaction pt states something happened while in the hospital after surgery.      Medication List       Accurate as of April 29, 2019 11:17 AM. If you have any questions, ask your nurse or doctor.        albuterol 108 (90 Base) MCG/ACT inhaler Commonly known as: Ventolin HFA Inhale 2 puffs into the lungs every 6 (six) hours as needed for wheezing or shortness of breath.   Asmanex (120 Metered Doses) 220 MCG/INH inhaler Generic drug: mometasone Inhale 2 puffs into the lungs daily.   aspirin 81 MG EC tablet Take 81 mg by mouth daily.   atorvastatin 40 MG tablet Commonly known as: LIPITOR Take 1 tablet (40 mg total) by mouth at bedtime.   cloNIDine 0.1 MG tablet Commonly known as: CATAPRES Take 1  tablet (0.1 mg total) by mouth every evening.   conjugated estrogens vaginal cream Commonly known as: Premarin Place 1 Applicatorful vaginally daily. Apply 0.5mg  (pea-sized amount)  just inside the vaginal introitus with a finger-tip every night for two weeks and then Monday, Wednesday and Friday nights.   cyclobenzaprine 5 MG tablet Commonly known as: FLEXERIL Take 1 tablet (5 mg total) by mouth 3 (three) times daily as needed.   diclofenac 75 MG EC tablet Commonly known as: VOLTAREN Take 1 tablet (75 mg total) by mouth 2 (two) times daily.   DULoxetine 30 MG capsule Commonly known as: CYMBALTA Take 3 capsules (90 mg total) by mouth daily.     EQ Allergy Relief 10 MG tablet Generic drug: loratadine TAKE ONE TABLET BY MOUTH ONCE DAILY   fluticasone 50 MCG/ACT nasal spray Commonly known as: FLONASE Place 2 sprays into both nostrils daily.   gabapentin 400 MG capsule Commonly known as: Neurontin 1 capsule in the morning and at midday, 2 in the evening   glucose blood test strip Use once daily.   hydrOXYzine 25 MG tablet Commonly known as: ATARAX/VISTARIL Take 1 tablet (25 mg total) by mouth 3 (three) times daily as needed (for severe anxiety only).   ketoconazole 2 % cream Commonly known as: NIZORAL Apply 1 application topically daily.   levothyroxine 112 MCG tablet Commonly known as: SYNTHROID Take 1 tablet (112 mcg total) by mouth daily before breakfast.   metFORMIN 750 MG 24 hr tablet Commonly known as: GLUCOPHAGE-XR Take 1 tablet (750 mg total) by mouth daily with breakfast.   metoprolol succinate 50 MG 24 hr tablet Commonly known as: TOPROL-XL Take 1 tablet (50 mg total) by mouth daily. Take with or immediately following a meal.   montelukast 10 MG tablet Commonly known as: SINGULAIR Take 1 tablet (10 mg total) by mouth at bedtime.   nitroGLYCERIN 0.4 MG SL tablet Commonly known as: NITROSTAT Place 1 tablet (0.4 mg total) under the tongue every 5 (five) minutes as needed.   olmesartan-hydrochlorothiazide 40-25 MG tablet Commonly known as: BENICAR HCT Take 1 tablet by mouth daily.   omeprazole 40 MG capsule Commonly known as: PRILOSEC TAKE 1 CAPSULE BY MOUTH DAILY   QUEtiapine 50 MG tablet Commonly known as: SEROquel Take 1-1.5 tablets (50-75 mg total) by mouth at bedtime.   rizatriptan 10 MG tablet Commonly known as: MAXALT TAKE 1 TABLET BY MOUTH THREE TIMES DAILY AS NEEDED FOR  MIGRAINE   Trospium Chloride 60 MG Cp24 Take 1 capsule by mouth once daily   Trulance 3 MG Tabs Generic drug: Plecanatide Take 1 tablet by mouth daily. In place of linzess       Allergies:  Allergies   Allergen Reactions   Latex Swelling    Pt unsure of reaction pt states something happened while in the hospital after surgery.    Family History: Family History  Problem Relation Age of Onset   Diabetes Sister    Anxiety disorder Sister    Depression Sister    Hypertension Sister    Cirrhosis Mother    Diabetes Mother    Cirrhosis Father    Breast cancer Sister 3   Heart attack Brother    Alcohol abuse Brother    Drug abuse Brother    Prostate cancer Neg Hx    Kidney cancer Neg Hx    Bladder Cancer Neg Hx     Social History:  reports that she has never smoked. She has never used smokeless tobacco.  She reports that she does not drink alcohol or use drugs.  ROS:                                        Physical Exam: There were no vitals taken for this visit.  Constitutional:  Well nourished. Alert and oriented, No acute distress. HEENT: [place] AT, moist mucus membranes.  Trachea midline, no masses. Cardiovascular: No clubbing, cyanosis, or edema. Respiratory: Normal respiratory effort, no increased work of breathing. GI: Abdomen is soft, non tender, non distended, no abdominal masses. Liver and spleen not palpable.  No hernias appreciated.  Stool sample for occult testing is not indicated.   GU: No CVA tenderness.  No bladder fullness or masses.  *** external genitalia, *** pubic hair distribution, no lesions.  Normal urethral meatus, no lesions, no prolapse, no discharge.   No urethral masses, tenderness and/or tenderness. No bladder fullness, tenderness or masses. *** vagina mucosa, *** estrogen effect, no discharge, no lesions, *** pelvic support, *** cystocele and *** rectocele noted.  No cervical motion tenderness.  Uterus is freely mobile and non-fixed.  No adnexal/parametria masses or tenderness noted.  Anus and perineum are without rashes or lesions.   ***  Skin: No rashes, bruises or suspicious lesions. Lymph: No cervical or inguinal  adenopathy. Neurologic: Grossly intact, no focal deficits, moving all 4 extremities. Psychiatric: Normal mood and affect.   Laboratory Data: Lab Results  Component Value Date   WBC 10.6 12/09/2018   HGB 11.2 (L) 12/09/2018   HCT 32.8 (L) 12/09/2018   MCV 82.2 12/09/2018   PLT 311 12/09/2018    Lab Results  Component Value Date   CREATININE 0.86 12/09/2018     Lab Results  Component Value Date   HGBA1C 7.6 (A) 04/14/2019    Lab Results  Component Value Date   TSH 2.74 10/26/2018       Component Value Date/Time   CHOL 159 12/09/2018 1052   CHOL 192 12/20/2015 1012   HDL 38 (L) 12/09/2018 1052   HDL 46 12/20/2015 1012   CHOLHDL 4.2 12/09/2018 1052   VLDL 44 (H) 02/12/2017 1059   LDLCALC 84 12/09/2018 1052    Lab Results  Component Value Date   AST 21 12/09/2018   Lab Results  Component Value Date   ALT 22 12/09/2018   I have reviewed the labs  Pertinent Imaging: ***    Assessment & Plan:    1. Mixed urinary Incontinence Continue Sanctura XL 60 mg daily Return to clinic in 1 year for OAB questionnaire and PVR                               2. Vaginal atrophy Continue Premarin cream 3 nights weekly She'll return to clinic in 12 months for exam  No follow-ups on file.  These notes generated with voice recognition software. I apologize for typographical errors.  Zara Council, PA-C  [facility] [address] [place], [place] [address] [phone]

## 2019-04-30 ENCOUNTER — Ambulatory Visit: Payer: BLUE CROSS/BLUE SHIELD | Admitting: Urology

## 2019-04-30 ENCOUNTER — Encounter: Payer: Self-pay | Admitting: Urology

## 2019-05-03 ENCOUNTER — Encounter: Payer: Self-pay | Admitting: Urology

## 2019-05-03 ENCOUNTER — Ambulatory Visit (INDEPENDENT_AMBULATORY_CARE_PROVIDER_SITE_OTHER): Payer: BLUE CROSS/BLUE SHIELD | Admitting: Urology

## 2019-05-03 ENCOUNTER — Other Ambulatory Visit: Payer: Self-pay

## 2019-05-03 VITALS — BP 129/87 | HR 78 | Ht 61.0 in | Wt 264.0 lb

## 2019-05-03 DIAGNOSIS — R109 Unspecified abdominal pain: Secondary | ICD-10-CM

## 2019-05-03 DIAGNOSIS — N3946 Mixed incontinence: Secondary | ICD-10-CM | POA: Diagnosis not present

## 2019-05-03 DIAGNOSIS — N952 Postmenopausal atrophic vaginitis: Secondary | ICD-10-CM | POA: Diagnosis not present

## 2019-05-03 LAB — BLADDER SCAN AMB NON-IMAGING: Scan Result: 17

## 2019-05-03 MED ORDER — TROSPIUM CHLORIDE ER 60 MG PO CP24: 1.0000 | ORAL_CAPSULE | Freq: Every day | ORAL | 3 refills | Status: DC

## 2019-05-03 MED ORDER — PREMARIN 0.625 MG/GM VA CREA: 1.0000 | TOPICAL_CREAM | Freq: Every day | VAGINAL | 12 refills | Status: DC

## 2019-05-03 NOTE — Progress Notes (Signed)
05/03/2019 10:35 AM   Beverly Barrett 04/06/1961 [id number]  Referring provider: Steele Sizer, MD [address] [place],  [place] [address]  Chief Complaint  Patient presents with  . Follow-up    HPI: 58 yo female with mixed urinary incontinence and vaginal atrophy who presents today for a yearly follow up.  Mixed urinary incontinence The patient has been experiencing urgency x 0-3 (improved),  frequency x 4-7 (improved), she is restricting fluid in an effort not to make the trips to the bathroom, she engages in toilet mapping, incontinence x 0-3 (stable) and nocturia x 0-3 (stable).  Her PVR is 17 mL.  Her BP is 129/87.   She is experiencing frequency and incontinence.  Patient denies any gross hematuria, dysuria or suprapubic/flank pain.  Patient denies any fevers, chills, nausea or vomiting.   Vaginal atrophy She is applying the vaginal cream 3 nights weekly. She's not had vaginal irritation or rash.   She has been having this pressure for awhile.  She states it starts in the flank area bilaterally and radiates around to her abdomen.  She states that it occurs first thing in the morning.  It is relieved by sitting down.  She is taking medicine for constipation.    PMH: Past Medical History:  Diagnosis Date  . Anginal pain ([place])    none in approx 2 yrs  . Anxiety   . Asthma   . Chronic lower back pain   . Common migraine with intractable migraine 09/20/2016  . Depression    suicidal ideation  . Diabetes mellitus without complication ([place])   . GERD (gastroesophageal reflux disease)   . Headache    migraines -3x/wk  . Hyperlipidemia   . Hypertension   . Hypothyroidism   . Motion sickness    cars  . Multilevel degenerative disc disease   . Shortness of breath dyspnea   . Sleep apnea    CPAP  . Vertigo   . Wears dentures    partial upper    Surgical History: Past Surgical History:  Procedure Laterality Date  . ABDOMINAL HYSTERECTOMY    .  bladder tach  1995  . CESAREAN SECTION    . COLONOSCOPY WITH PROPOFOL N/A 05/29/2015   Procedure: COLONOSCOPY WITH PROPOFOL;  Surgeon: Lucilla Lame, MD;  Location: [facility];  Service: Endoscopy;  Laterality: N/A;  Latex allergy sleep apnea - no CPAP machine (yet)  . ESOPHAGOGASTRODUODENOSCOPY N/A 04/23/2017   Procedure: ESOPHAGOGASTRODUODENOSCOPY (EGD);  Surgeon: Lin Landsman, MD;  Location: [facility];  Service: Gastroenterology;  Laterality: N/A;  Latex allergy sleep apnea  . POLYPECTOMY  05/29/2015   Procedure: POLYPECTOMY;  Surgeon: Lucilla Lame, MD;  Location: [facility];  Service: Endoscopy;;    Home Medications:  Allergies as of 05/03/2019      Reactions   Latex Swelling   Pt unsure of reaction pt states something happened while in the hospital after surgery.      Medication List       Accurate as of May 03, 2019 10:35 AM. If you have any questions, ask your nurse or doctor.        albuterol 108 (90 Base) MCG/ACT inhaler Commonly known as: Ventolin HFA Inhale 2 puffs into the lungs every 6 (six) hours as needed for wheezing or shortness of breath.   Asmanex (120 Metered Doses) 220 MCG/INH inhaler Generic drug: mometasone Inhale 2 puffs into the lungs daily.   aspirin 81 MG EC tablet Take  81 mg by mouth daily.   atorvastatin 40 MG tablet Commonly known as: LIPITOR Take 1 tablet (40 mg total) by mouth at bedtime.   cloNIDine 0.1 MG tablet Commonly known as: CATAPRES Take 1 tablet (0.1 mg total) by mouth every evening.   cyclobenzaprine 5 MG tablet Commonly known as: FLEXERIL Take 1 tablet (5 mg total) by mouth 3 (three) times daily as needed.   diclofenac 75 MG EC tablet Commonly known as: VOLTAREN Take 1 tablet (75 mg total) by mouth 2 (two) times daily.   DULoxetine 30 MG capsule Commonly known as: CYMBALTA Take 3 capsules (90 mg total) by mouth daily.   EQ Allergy Relief 10 MG tablet Generic drug: loratadine TAKE  ONE TABLET BY MOUTH ONCE DAILY   fluticasone 50 MCG/ACT nasal spray Commonly known as: FLONASE Place 2 sprays into both nostrils daily.   gabapentin 400 MG capsule Commonly known as: Neurontin 1 capsule in the morning and at midday, 2 in the evening   glucose blood test strip Use once daily.   hydrOXYzine 25 MG tablet Commonly known as: ATARAX/VISTARIL Take 1 tablet (25 mg total) by mouth 3 (three) times daily as needed (for severe anxiety only).   ketoconazole 2 % cream Commonly known as: NIZORAL Apply 1 application topically daily.   levothyroxine 112 MCG tablet Commonly known as: SYNTHROID Take 1 tablet (112 mcg total) by mouth daily before breakfast.   metFORMIN 750 MG 24 hr tablet Commonly known as: GLUCOPHAGE-XR Take 1 tablet (750 mg total) by mouth daily with breakfast.   metoprolol succinate 50 MG 24 hr tablet Commonly known as: TOPROL-XL Take 1 tablet (50 mg total) by mouth daily. Take with or immediately following a meal.   montelukast 10 MG tablet Commonly known as: SINGULAIR Take 1 tablet (10 mg total) by mouth at bedtime.   nitroGLYCERIN 0.4 MG SL tablet Commonly known as: NITROSTAT Place 1 tablet (0.4 mg total) under the tongue every 5 (five) minutes as needed.   olmesartan-hydrochlorothiazide 40-25 MG tablet Commonly known as: BENICAR HCT Take 1 tablet by mouth daily.   omeprazole 40 MG capsule Commonly known as: PRILOSEC TAKE 1 CAPSULE BY MOUTH DAILY   Premarin vaginal cream Generic drug: conjugated estrogens Place 1 Applicatorful vaginally daily. Apply 0.5mg  (pea-sized amount)  just inside the vaginal introitus with a finger-tip every night for two weeks and then Monday, Wednesday and Friday nights.   QUEtiapine 50 MG tablet Commonly known as: SEROquel Take 1-1.5 tablets (50-75 mg total) by mouth at bedtime.   rizatriptan 10 MG tablet Commonly known as: MAXALT TAKE 1 TABLET BY MOUTH THREE TIMES DAILY AS NEEDED FOR  MIGRAINE   Trospium  Chloride 60 MG Cp24 Take 1 capsule (60 mg total) by mouth daily.   Trulance 3 MG Tabs Generic drug: Plecanatide Take 1 tablet by mouth daily. In place of linzess       Allergies:  Allergies  Allergen Reactions  . Latex Swelling    Pt unsure of reaction pt states something happened while in the hospital after surgery.    Family History: Family History  Problem Relation Age of Onset  . Diabetes Sister   . Anxiety disorder Sister   . Depression Sister   . Hypertension Sister   . Cirrhosis Mother   . Diabetes Mother   . Cirrhosis Father   . Breast cancer Sister 29  . Heart attack Brother   . Alcohol abuse Brother   . Drug abuse Brother   .  Prostate cancer Neg Hx   . Kidney cancer Neg Hx   . Bladder Cancer Neg Hx     Social History:  reports that she has never smoked. She has never used smokeless tobacco. She reports that she does not drink alcohol or use drugs.  ROS: UROLOGY Frequent Urination?: Yes Hard to postpone urination?: No Burning/pain with urination?: No Get up at night to urinate?: No Leakage of urine?: Yes Urine stream starts and stops?: No Trouble starting stream?: No Do you have to strain to urinate?: No Blood in urine?: No Urinary tract infection?: No Sexually transmitted disease?: No Injury to kidneys or bladder?: No Painful intercourse?: Yes Weak stream?: No Currently pregnant?: No Vaginal bleeding?: No Last menstrual period?: n  Gastrointestinal Nausea?: Yes Vomiting?: No Indigestion/heartburn?: No Diarrhea?: No Constipation?: Yes  Constitutional Fever: No Night sweats?: Yes Weight loss?: No Fatigue?: No  Skin Skin rash/lesions?: No Itching?: No  Eyes Blurred vision?: No Double vision?: No  Ears/Nose/Throat Sore throat?: No Sinus problems?: No  Hematologic/Lymphatic Swollen glands?: No Easy bruising?: No  Cardiovascular Leg swelling?: Yes Chest pain?: No  Respiratory Cough?: No Shortness of breath?: Yes   Endocrine Excessive thirst?: No  Musculoskeletal Back pain?: Yes Joint pain?: Yes  Neurological Headaches?: Yes Dizziness?: Yes  Psychologic Depression?: Yes Anxiety?: Yes  Physical Exam: BP 129/87   Pulse 78   Ht 5\' 1"  (1.549 m)   Wt 264 lb (119.7 kg)   BMI 49.88 kg/m   Constitutional:  Well nourished. Alert and oriented, No acute distress. HEENT: [place] AT, moist mucus membranes.  Trachea midline, no masses. Cardiovascular: No clubbing, cyanosis, or edema. Respiratory: Normal respiratory effort, no increased work of breathing. Neurologic: Grossly intact, no focal deficits, moving all 4 extremities. Psychiatric: Normal mood and affect.   Laboratory Data: Lab Results  Component Value Date   WBC 10.6 12/09/2018   HGB 11.2 (L) 12/09/2018   HCT 32.8 (L) 12/09/2018   MCV 82.2 12/09/2018   PLT 311 12/09/2018    Lab Results  Component Value Date   CREATININE 0.86 12/09/2018     Lab Results  Component Value Date   HGBA1C 7.6 (A) 04/14/2019    Lab Results  Component Value Date   TSH 2.74 10/26/2018       Component Value Date/Time   CHOL 159 12/09/2018 1052   CHOL 192 12/20/2015 1012   HDL 38 (L) 12/09/2018 1052   HDL 46 12/20/2015 1012   CHOLHDL 4.2 12/09/2018 1052   VLDL 44 (H) 02/12/2017 1059   LDLCALC 84 12/09/2018 1052    Lab Results  Component Value Date   AST 21 12/09/2018   Lab Results  Component Value Date   ALT 22 12/09/2018   I have reviewed the labs  Pertinent Imaging: Results for ALTON, FALLETTA (MRN [id number]) as of 05/03/2019 10:40  Ref. Range 05/03/2019 10:09  Scan Result Unknown 17    Assessment & Plan:    1. Mixed urinary Incontinence Continue trospium 60 mg XR daily - refills given RTC in one year for OAB questionnaire and PVR                               2. Vaginal atrophy Continue vaginal estrogen cream three nights weekly  3. Flank pain Will obtain a RUS for further evaluation   Return in about 1 year (around  05/02/2020) for PVR and OAB questionnaire.  These notes generated with voice recognition software.  I apologize for typographical errors.  Zara Council, PA-C  [facility] [address] [place], [place] [address] [phone]

## 2019-05-04 ENCOUNTER — Other Ambulatory Visit: Payer: Self-pay | Admitting: Family Medicine

## 2019-05-04 DIAGNOSIS — R232 Flushing: Secondary | ICD-10-CM

## 2019-05-04 DIAGNOSIS — E1159 Type 2 diabetes mellitus with other circulatory complications: Secondary | ICD-10-CM

## 2019-05-31 ENCOUNTER — Other Ambulatory Visit: Payer: Self-pay | Admitting: Family Medicine

## 2019-05-31 DIAGNOSIS — J4520 Mild intermittent asthma, uncomplicated: Secondary | ICD-10-CM

## 2019-05-31 NOTE — Telephone Encounter (Signed)
Requested medication (s) are due for refill today: yes  Requested medication (s) are on the active medication list: yes  Last refill:  08/03/2018  Future visit scheduled: yes  Notes to clinic:  One inhaler should last at least one month   Requested Prescriptions  Pending Prescriptions Disp Refills   VENTOLIN HFA 108 (90 Base) MCG/ACT inhaler [Pharmacy Med Name: VENTOLIN HFA INH W/DOS CTR 200PUFFS] 54 g     Sig: INHALE 2 PUFFS BY MOUTH INTO THE LUNGS EVERY 6 HOURS AS NEEDED FOR WHEEZING OR SHORTNESS OF BREATH     Pulmonology:  Beta Agonists Failed - 05/31/2019  2:56 PM      Failed - One inhaler should last at least one month. If the patient is requesting refills earlier, contact the patient to check for uncontrolled symptoms.      Passed - Valid encounter within last 12 months    Recent Outpatient Visits          1 month ago Dyslipidemia associated with type 2 diabetes mellitus [facility])   [facility] [place], Drue Stager, MD   6 months ago Dyslipidemia associated with type 2 diabetes mellitus [facility])   [facility] Steele Sizer, MD   6 months ago [facility] Steele Sizer, MD   8 months ago Tooth abscess   [facility] Steele Sizer, MD   10 months ago Dyslipidemia associated with type 2 diabetes mellitus [facility])   [facility] Steele Sizer, MD      Future Appointments            In 1 month Ancil Boozer, Drue Stager, MD [facility], [place]   In 11 months [place], Gordan Payment [facility] [place]

## 2019-06-02 ENCOUNTER — Ambulatory Visit
Admission: RE | Admit: 2019-06-02 | Discharge: 2019-06-02 | Disposition: A | Discharge status: Home / Self Care | Payer: BLUE CROSS/BLUE SHIELD | Source: Ambulatory Visit | Attending: Urology | Admitting: Urology

## 2019-06-02 ENCOUNTER — Other Ambulatory Visit: Payer: Self-pay

## 2019-06-02 DIAGNOSIS — R109 Unspecified abdominal pain: Secondary | ICD-10-CM | POA: Insufficient documentation

## 2019-06-16 ENCOUNTER — Ambulatory Visit (INDEPENDENT_AMBULATORY_CARE_PROVIDER_SITE_OTHER): Payer: BLUE CROSS/BLUE SHIELD | Admitting: Psychiatry

## 2019-06-16 ENCOUNTER — Other Ambulatory Visit: Payer: Self-pay

## 2019-06-16 ENCOUNTER — Encounter: Payer: Self-pay | Admitting: Psychiatry

## 2019-06-16 DIAGNOSIS — F3341 Major depressive disorder, recurrent, in partial remission: Secondary | ICD-10-CM | POA: Diagnosis not present

## 2019-06-16 DIAGNOSIS — F411 Generalized anxiety disorder: Secondary | ICD-10-CM

## 2019-06-16 MED ORDER — QUETIAPINE FUMARATE 50 MG PO TABS: 50.0000 mg | ORAL_TABLET | Freq: Every day | ORAL | 1 refills | Status: DC

## 2019-06-16 MED ORDER — DULOXETINE HCL 30 MG PO CPEP: 90.0000 mg | ORAL_CAPSULE | Freq: Every day | ORAL | 1 refills | Status: DC

## 2019-06-16 NOTE — Progress Notes (Signed)
Virtual Visit via Video Note  I connected with Beverly Barrett on 06/16/19 at 10:00 AM EST by a video enabled telemedicine application and verified that I am speaking with the correct person using two identifiers.   I discussed the limitations of evaluation and management by telemedicine and the availability of in person appointments. The patient expressed understanding and agreed to proceed.     I discussed the assessment and treatment plan with the patient. The patient was provided an opportunity to ask questions and all were answered. The patient agreed with the plan and demonstrated an understanding of the instructions.   The patient was advised to call back or seek an in-person evaluation if the symptoms worsen or if the condition fails to improve as anticipated.  [facility] MD OP Progress Note  06/16/2019 5:32 PM Beverly Barrett  MRN:  [id number]  Chief Complaint:  Chief Complaint    Follow-up     HPI: Beverly Barrett is a 58 year old African-American female, lives in [place], has a history of MDD, GAD, arthritis, chronic pain, history of CPAP, migraine headaches, hypothyroidism was evaluated by telemedicine today.  Patient today reports she is currently doing well on the current medication regimen.  She denies any significant sadness or other depressive symptoms.  She denies any significant anxiety symptoms.  She reports sleep is okay.  She does wake up on and off at night, however she is able to fall back asleep.  The Seroquel does help.  She reports she has had episodes of seeing images occasionally.  She saw a random image of a woman in the hallway, and thought it may be her mom who passed away.  It however happened only once and has not happened after that.  She does not think it distresses her , it is more positive for her.  Patient does report having muscle spasms and cramps however reports she had it even before the Seroquel was started and does not think it is related to the  Seroquel.  She denies any tremors.  Patient appeared to be alert, oriented to person place time and situation.  Patient denies any suicidality or homicidality.  Patient denies any other concerns today. Visit Diagnosis:    ICD-10-CM   1. MDD (major depressive disorder), recurrent, in partial remission (HCC)  F33.41 DULoxetine (CYMBALTA) 30 MG capsule    QUEtiapine (SEROQUEL) 50 MG tablet   stable  2. GAD (generalized anxiety disorder)  F41.1 DULoxetine (CYMBALTA) 30 MG capsule   stable    Past Psychiatric History: Reviewed past psychiatric history from my progress note on 01/21/2018.  Past Medical History:  Past Medical History:  Diagnosis Date  . Anginal pain ([place])    none in approx 2 yrs  . Anxiety   . Asthma   . Chronic lower back pain   . Common migraine with intractable migraine 09/20/2016  . Depression    suicidal ideation  . Diabetes mellitus without complication ([place])   . GERD (gastroesophageal reflux disease)   . Headache    migraines -3x/wk  . Hyperlipidemia   . Hypertension   . Hypothyroidism   . Motion sickness    cars  . Multilevel degenerative disc disease   . Shortness of breath dyspnea   . Sleep apnea    CPAP  . Vertigo   . Wears dentures    partial upper    Past Surgical History:  Procedure Laterality Date  . ABDOMINAL HYSTERECTOMY    . bladder tach  1995  . CESAREAN SECTION    .  COLONOSCOPY WITH PROPOFOL N/A 05/29/2015   Procedure: COLONOSCOPY WITH PROPOFOL;  Surgeon: Lucilla Lame, MD;  Location: [facility];  Service: Endoscopy;  Laterality: N/A;  Latex allergy sleep apnea - no CPAP machine (yet)  . ESOPHAGOGASTRODUODENOSCOPY N/A 04/23/2017   Procedure: ESOPHAGOGASTRODUODENOSCOPY (EGD);  Surgeon: Lin Landsman, MD;  Location: [facility];  Service: Gastroenterology;  Laterality: N/A;  Latex allergy sleep apnea  . POLYPECTOMY  05/29/2015   Procedure: POLYPECTOMY;  Surgeon: Lucilla Lame, MD;  Location: [facility];   Service: Endoscopy;;    Family Psychiatric History: Reviewed family psychiatric history from my progress note on 01/21/2018.  Family History:  Family History  Problem Relation Age of Onset  . Diabetes Sister   . Anxiety disorder Sister   . Depression Sister   . Hypertension Sister   . Cirrhosis Mother   . Diabetes Mother   . Cirrhosis Father   . Breast cancer Sister 61  . Heart attack Brother   . Alcohol abuse Brother   . Drug abuse Brother   . Prostate cancer Neg Hx   . Kidney cancer Neg Hx   . Bladder Cancer Neg Hx     Social History: Reviewed social history from my progress note on 01/21/2018. Social History   Socioeconomic History  . Marital status: Married    Spouse name: clayton sr  . Number of children: 3  . Years of education: Not on file  . Highest education level: GED or equivalent  Occupational History  . Not on file  Social Needs  . Financial resource strain: Very hard  . Food insecurity    Worry: Often true    Inability: Often true  . Transportation needs    Medical: No    Non-medical: No  Tobacco Use  . Smoking status: Never Smoker  . Smokeless tobacco: Never Used  Substance and Sexual Activity  . Alcohol use: No    Alcohol/week: 0.0 standard drinks  . Drug use: No  . Sexual activity: Yes    Partners: Male  Lifestyle  . Physical activity    Days per week: 2 days    Minutes per session: 30 min  . Stress: Very much  Relationships  . Social Herbalist on phone: Three times a week    Gets together: Once a week    Attends religious service: More than 4 times per year    Active member of club or organization: No    Attends meetings of clubs or organizations: Never    Relationship status: Married  Other Topics Concern  . Not on file  Social History Narrative   Lives with husband and one daughter other kids are out of the house   She does not work, husband on disability     Allergies:  Allergies  Allergen Reactions  . Latex  Swelling    Pt unsure of reaction pt states something happened while in the hospital after surgery.    Metabolic Disorder Labs: Lab Results  Component Value Date   HGBA1C 7.6 (A) 04/14/2019   MPG 151 12/09/2018   MPG 143 08/18/2017   No results found for: PROLACTIN Lab Results  Component Value Date   CHOL 159 12/09/2018   TRIG 305 (H) 12/09/2018   HDL 38 (L) 12/09/2018   CHOLHDL 4.2 12/09/2018   VLDL 44 (H) 02/12/2017   LDLCALC 84 12/09/2018   LDLCALC 85 03/23/2018   Lab Results  Component Value Date   TSH 2.74 10/26/2018  TSH 7.94 (H) 09/08/2018    Therapeutic Level Labs: No results found for: LITHIUM No results found for: VALPROATE No components found for:  CBMZ  Current Medications: Current Outpatient Medications  Medication Sig Dispense Refill  . aspirin 81 MG EC tablet Take 81 mg by mouth daily.      Marland Kitchen atorvastatin (LIPITOR) 40 MG tablet Take 1 tablet (40 mg total) by mouth at bedtime. 90 tablet 1  . cloNIDine (CATAPRES) 0.1 MG tablet TAKE 1 TABLET BY MOUTH TWICE DAILY. START AT NIGHT FOR HOTFLASHES AND TRY TAKING TWICE DAILY IF TOLERATED 180 tablet 0  . conjugated estrogens (PREMARIN) vaginal cream Place 1 Applicatorful vaginally daily. Apply 0.5mg  (pea-sized amount)  just inside the vaginal introitus with a finger-tip every night for two weeks and then Monday, Wednesday and Friday nights. 30 g 12  . cyclobenzaprine (FLEXERIL) 5 MG tablet Take 1 tablet (5 mg total) by mouth 3 (three) times daily as needed. 30 tablet 0  . diclofenac (VOLTAREN) 75 MG EC tablet Take 1 tablet (75 mg total) by mouth 2 (two) times daily. 20 tablet 0  . DULoxetine (CYMBALTA) 30 MG capsule Take 3 capsules (90 mg total) by mouth daily. 270 capsule 1  . EQ ALLERGY RELIEF 10 MG tablet TAKE ONE TABLET BY MOUTH ONCE DAILY 90 tablet 1  . fluticasone (FLONASE) 50 MCG/ACT nasal spray Place 2 sprays into both nostrils daily.     Marland Kitchen gabapentin (NEURONTIN) 400 MG capsule 1 capsule in the morning and  at midday, 2 in the evening 360 capsule 1  . glucose blood test strip Use once daily. 100 each 12  . hydrOXYzine (ATARAX/VISTARIL) 25 MG tablet Take 1 tablet (25 mg total) by mouth 3 (three) times daily as needed (for severe anxiety only). 270 tablet 1  . ketoconazole (NIZORAL) 2 % cream Apply 1 application topically daily. 60 g 0  . levothyroxine (SYNTHROID) 112 MCG tablet Take 1 tablet (112 mcg total) by mouth daily before breakfast. 90 tablet 0  . metFORMIN (GLUCOPHAGE-XR) 750 MG 24 hr tablet Take 1 tablet (750 mg total) by mouth daily with breakfast. 90 tablet 1  . metoprolol succinate (TOPROL-XL) 50 MG 24 hr tablet TAKE 1 TABLET BY MOUTH DAILY 90 tablet 1  . Microlet Lancets MISC USE ONCE D.    . mometasone (ASMANEX, 120 METERED DOSES,) 220 MCG/INH inhaler Inhale 2 puffs into the lungs daily. 3 Inhaler 1  . montelukast (SINGULAIR) 10 MG tablet Take 1 tablet (10 mg total) by mouth at bedtime. 90 tablet 1  . nitroGLYCERIN (NITROSTAT) 0.4 MG SL tablet Place 1 tablet (0.4 mg total) under the tongue every 5 (five) minutes as needed. 25 tablet 0  . olmesartan-hydrochlorothiazide (BENICAR HCT) 40-25 MG tablet Take 1 tablet by mouth daily. 90 tablet 1  . omeprazole (PRILOSEC) 40 MG capsule TAKE 1 CAPSULE BY MOUTH DAILY 90 capsule 1  . Plecanatide (TRULANCE) 3 MG TABS Take 1 tablet by mouth daily. In place of linzess 90 tablet 1  . QUEtiapine (SEROQUEL) 50 MG tablet Take 1-1.5 tablets (50-75 mg total) by mouth at bedtime. 135 tablet 1  . rizatriptan (MAXALT) 10 MG tablet TAKE 1 TABLET BY MOUTH THREE TIMES DAILY AS NEEDED FOR  MIGRAINE 10 tablet 3  . Trospium Chloride 60 MG CP24 Take 1 capsule (60 mg total) by mouth daily. 90 capsule 3  . VENTOLIN HFA 108 (90 Base) MCG/ACT inhaler INHALE 2 PUFFS BY MOUTH INTO THE LUNGS EVERY 6 HOURS AS NEEDED FOR  WHEEZING OR SHORTNESS OF BREATH 54 g 0   No current facility-administered medications for this visit.      Musculoskeletal: Strength & Muscle Tone:  UTA Gait & Station: normal Patient leans: N/A  Psychiatric Specialty Exam: Review of Systems  Psychiatric/Behavioral: Negative for depression, hallucinations, substance abuse and suicidal ideas. The patient is not nervous/anxious and does not have insomnia.   All other systems reviewed and are negative.   There were no vitals taken for this visit.There is no height or weight on file to calculate BMI.  General Appearance: Casual  Eye Contact:  Fair  Speech:  Clear and Coherent  Volume:  Normal  Mood:  Euthymic  Affect:  Congruent  Thought Process:  Goal Directed and Descriptions of Associations: Intact  Orientation:  Full (Time, Place, and Person)  Thought Content: Logical   Suicidal Thoughts:  No  Homicidal Thoughts:  No  Memory:  Immediate;   Fair Recent;   Fair Remote;   Fair  Judgement:  Fair  Insight:  Fair  Psychomotor Activity:  Normal  Concentration:  Concentration: Fair and Attention Span: Fair  Recall:  [employer] of Knowledge: Fair  Language: Fair  Akathisia:  No  Handed:  Right  AIMS (if indicated): Denies tremors  Assets:  Communication Skills Desire for Improvement Housing Social Support  ADL's:  Intact  Cognition: WNL  Sleep:  Fair   Screenings: GAD-7     Office Visit from 03/19/2018 in [facility] Office Visit from 03/04/2016 in [facility] Office Visit from 12/20/2015 in [facility]  Total GAD-7 Score  14  18  18     PHQ2-9     Office Visit from 04/14/2019 in [facility] Office Visit from 12/02/2018 in [facility] Office Visit from 11/27/2018 in [facility] Office Visit from 08/03/2018 in [facility] Nutrition from 04/14/2018 in [facility]  PHQ-2 Total Score  1  5  3  4  4   PHQ-9 Total Score  8  12  10  11  14        Assessment and Plan: Beverly Barrett is a 58 year old African-American female who is  married, lives in [place], has a history of MDD, GAD, insomnia, OSA on CPAP, migraine headaches, hypothyroidism, arthritis was evaluated by telemedicine today.  Patient with psychosocial stressors of COVID-19 pandemic however is currently coping well.  She will continue to need medication management and her symptoms are currently stable.  Plan MDD-stable Cymbalta 90 mg p.o. daily Seroquel 50 to 75 mg p.o. nightly  GAD-stable Cymbalta as prescribed Hydroxyzine 25 mg p.o. 3 times daily as needed  Follow-up in clinic in 2 months or sooner if needed.  January 14 at 11:30 AM  I have spent atleast 15 minutes non face to face with patient today. More than 50 % of the time was spent for psychoeducation and supportive psychotherapy and care coordination. This note was generated in part or whole with voice recognition software. Voice recognition is usually quite accurate but there are transcription errors that can and very often do occur. I apologize for any typographical errors that were not detected and corrected.       Ursula Alert, MD 06/16/2019, 5:32 PM

## 2019-07-13 ENCOUNTER — Other Ambulatory Visit (INDEPENDENT_AMBULATORY_CARE_PROVIDER_SITE_OTHER): Payer: BLUE CROSS/BLUE SHIELD

## 2019-07-13 DIAGNOSIS — E1169 Type 2 diabetes mellitus with other specified complication: Secondary | ICD-10-CM

## 2019-07-13 DIAGNOSIS — E785 Hyperlipidemia, unspecified: Secondary | ICD-10-CM

## 2019-07-13 DIAGNOSIS — R1030 Lower abdominal pain, unspecified: Secondary | ICD-10-CM

## 2019-07-13 LAB — POCT URINALYSIS DIPSTICK
Bilirubin, UA: NEGATIVE
Glucose, UA: NEGATIVE
Ketones, UA: NEGATIVE
Nitrite, UA: NEGATIVE
Protein, UA: NEGATIVE
Spec Grav, UA: 1.02 (ref 1.010–1.025)
Urobilinogen, UA: 0.2 E.U./dL
pH, UA: 6.5 (ref 5.0–8.0)

## 2019-07-13 LAB — POCT GLYCOSYLATED HEMOGLOBIN (HGB A1C): Hemoglobin A1C: 7.1 % — AB (ref 4.0–5.6)

## 2019-07-13 NOTE — Progress Notes (Signed)
Urine culture

## 2019-07-14 ENCOUNTER — Ambulatory Visit (INDEPENDENT_AMBULATORY_CARE_PROVIDER_SITE_OTHER): Payer: BLUE CROSS/BLUE SHIELD | Admitting: Family Medicine

## 2019-07-14 ENCOUNTER — Encounter: Payer: Self-pay | Admitting: Family Medicine

## 2019-07-14 VITALS — BP 152/93

## 2019-07-14 DIAGNOSIS — G43009 Migraine without aura, not intractable, without status migrainosus: Secondary | ICD-10-CM | POA: Diagnosis not present

## 2019-07-14 DIAGNOSIS — F331 Major depressive disorder, recurrent, moderate: Secondary | ICD-10-CM

## 2019-07-14 DIAGNOSIS — J4520 Mild intermittent asthma, uncomplicated: Secondary | ICD-10-CM

## 2019-07-14 DIAGNOSIS — R300 Dysuria: Secondary | ICD-10-CM

## 2019-07-14 DIAGNOSIS — Z9989 Dependence on other enabling machines and devices: Secondary | ICD-10-CM

## 2019-07-14 DIAGNOSIS — R232 Flushing: Secondary | ICD-10-CM

## 2019-07-14 DIAGNOSIS — F411 Generalized anxiety disorder: Secondary | ICD-10-CM

## 2019-07-14 DIAGNOSIS — E039 Hypothyroidism, unspecified: Secondary | ICD-10-CM

## 2019-07-14 DIAGNOSIS — G4733 Obstructive sleep apnea (adult) (pediatric): Secondary | ICD-10-CM | POA: Diagnosis not present

## 2019-07-14 DIAGNOSIS — E1169 Type 2 diabetes mellitus with other specified complication: Secondary | ICD-10-CM

## 2019-07-14 DIAGNOSIS — E1159 Type 2 diabetes mellitus with other circulatory complications: Secondary | ICD-10-CM

## 2019-07-14 DIAGNOSIS — I10 Essential (primary) hypertension: Secondary | ICD-10-CM

## 2019-07-14 DIAGNOSIS — E785 Hyperlipidemia, unspecified: Secondary | ICD-10-CM

## 2019-07-14 DIAGNOSIS — D509 Iron deficiency anemia, unspecified: Secondary | ICD-10-CM

## 2019-07-14 DIAGNOSIS — E034 Atrophy of thyroid (acquired): Secondary | ICD-10-CM

## 2019-07-14 MED ORDER — LEVOTHYROXINE SODIUM 112 MCG PO TABS: 112.0000 ug | ORAL_TABLET | Freq: Every day | ORAL | 0 refills | Status: DC

## 2019-07-14 MED ORDER — CIPROFLOXACIN HCL 250 MG PO TABS: 250.0000 mg | ORAL_TABLET | Freq: Two times a day (BID) | ORAL | 0 refills | Status: DC

## 2019-07-14 MED ORDER — AMLODIPINE BESYLATE 2.5 MG PO TABS: 2.5000 mg | ORAL_TABLET | Freq: Every day | ORAL | 0 refills | Status: DC

## 2019-07-14 MED ORDER — CLONIDINE HCL 0.1 MG PO TABS: 0.1000 mg | ORAL_TABLET | Freq: Two times a day (BID) | ORAL | 0 refills | Status: DC

## 2019-07-14 NOTE — Progress Notes (Signed)
Name: Beverly Barrett   MRN: [id number]    DOB: Mar 12, 1961   Date:07/14/2019       Progress Note  Subjective  Chief Complaint  Chief Complaint  Patient presents with  . Dysuria    x 1 week. She has nausea and it hurts when she urinates.   . Diabetes  . Hyperlipidemia  . Asthma  . Depression    I connected with  [name]  on 07/14/19 at 10:40 AM EST by telephone , since her daughter was not at her house today.  Discussed the limitations of evaluation and management by telemedicine and the availability of in person appointments. The patient expressed understanding and agreed to proceed. Staff also discussed with the patient that there may be a patient responsible charge related to this service. Patient Location: at home  Provider Location: [facility]   HPI  Dysuria: she states that for the past week she has noticed pain when she voids, she saw some blood when she voided earlier this week, no change in her chronic back pain, no fever, no chills. She has noticed some nausea but no vomiting. She denies a history of kidney stones She dropped off a urine specimen yesterday and culture is pending  Diabetes type 2 with dyslipidemia in obese: she has a long history of metabolic syndrome. However hgbA1C gradually went up and back in January 2019 her A1C went to 6.6%, she has gone to DM teaching class, A1C is down from 7.6% to 7.1%  We started her on Metformin Summer of 2020 , she denies side effects since she starting taking medication at night . She denies polyphagiabut she has chronicpolyuriaandPolydipsia.She is on Atorvastatin.  Major Depression/GAD:She is now under the care of Dr. Shea Evans, weaning self off Alprazolam, taking hdyroxizine prn anxiety and is feeling bad again, no lack of motivation, does not want to do anything with her children, she has follow up in January but advised to try to contact her sooner   HTN: she taking her medication  BenicarHCTZ and Toprol XL, she also taking Clonidine for hot flashes, bpt has been high at home in the 150's/90's. She has intermittent headaches, no chest pain , occasionally feels dizzy, we will add norvasc 2.5 mg and monitor bp at home and follow up in one month for bp check   Angina: had evaluation by cardiologist and Echo myoview in July normal result. She states chest pain still happens and at times during rest. She is on statin, aspirin, beta-blocker , ARB and has been taking NTG intermittently, but did not have to take any NTG since last visit. Pain is described as throbbing sensation on left side of chest and sometimes radiates to her left shoulder and back.She states not recent episodes and has not had to take NTG in a couple of weeks  Constipation: she was started on Linzess in 2017, but is now on Trulance prn only   and states has bowel movements daily. No side effects of medications, no longer having bleeding from hemorrhoids.Colonoscopy is up to date . No problems at this time  OSA: using CPAP every night, all night, and we started her on SeroquelShe is sleeping 6-7 hours, feeling rested most mornings.Unchanged   Morbid obesity:shestopped walking with her daughter since COVID-19, she states she has a ramp on her porch and has been going up and down her ramp every day  GERD:Seen by Dr. Marius Ditch and has gastritis, advised to stop goody powdersbut is taking nsaid's given  by Dr. Verdis Frederickson up with nausea, but improves once she gest up and takes Omeprazole and Dramamine. She has episodes of nausea   Migraine: seen at the headache center by Dr. Jannifer Franklin, started on Topamax and Maxalt recently, still having headaches at this time, sheis off Topamax, but is taking  gabapentin, but still having symptoms.She states headache is daily again, a few times a day but does not last a long time  Hypothyroidism she is currently taking medication daily, she has chronic dry skin and chronic  constipation, controlled with medication , lastTSHwasback to normal range , she is nowon Synthroid161mcgdaily. Advised to come in for TSH level  DDD lumbar spine: had MRI done, ordered by Ortho and was found to have DDD lumbar spine, she was seen by Dr. Phyllis Ginger but has not been back in a while, she had a flare this summer and took Diclofenac and flexeril and is stable at this time   Asthma mild: long history,she has intermittent symptoms, occasionally has a dry coughand occasional wheezing. She is still using Asmanex, she uses ventolin prn only when active less than twice a week. She has SOB with activity but stable    Iron deficiency anemia: last labs improved, colonoscopy up to date    Patient Active Problem List   Diagnosis Date Noted  . MDD (major depressive disorder), recurrent episode, moderate ([place]) 04/07/2019  . Chronic low back pain 12/31/2018  . Dental abscess 09/02/2018  . Grade II internal hemorrhoids 05/16/2017  . Mixed stress and urge urinary incontinence 11/12/2016  . Degenerative arthritis of lumbar spine 11/12/2016  . Common migraine with intractable migraine 09/20/2016  . Iron deficiency anemia 09/12/2016  . GAD (generalized anxiety disorder) 12/20/2015  . OSA on CPAP 12/20/2015  . Angina effort 12/20/2015  . Hyperglycemia 09/22/2015  . History of colonic polyps   . Benign neoplasm of sigmoid colon   . Frequency 04/14/2015  . Backache 04/14/2015  . Chronic pain of multiple joints 06/14/2014  . Dysmetabolic syndrome [id number]  . Acid reflux 06/14/2014  . Extreme obesity 06/14/2014  . Arthritis, degenerative 06/14/2014  . Morbid obesity with BMI of 40.0-44.9, adult ([place]) 06/14/2014  . Hypothyroidism due to acquired atrophy of thyroid 08/18/2013  . Hypothyroidism, unspecified 08/18/2013  . Hypertension goal BP (blood pressure) < 140/90 12/07/2010  . Familial multiple lipoprotein-type hyperlipidemia 12/07/2010  . CN (constipation) 11/21/2009  .  Allergic rhinitis 10/13/2008  . Asthma, mild intermittent, well-controlled 09/01/2008  . Major depressive disorder, recurrent episode, in partial remission with anxious distress ([place]) 08/13/2007    Past Surgical History:  Procedure Laterality Date  . ABDOMINAL HYSTERECTOMY    . bladder tach  1995  . CESAREAN SECTION    . COLONOSCOPY WITH PROPOFOL N/A 05/29/2015   Procedure: COLONOSCOPY WITH PROPOFOL;  Surgeon: Lucilla Lame, MD;  Location: [facility];  Service: Endoscopy;  Laterality: N/A;  Latex allergy sleep apnea - no CPAP machine (yet)  . ESOPHAGOGASTRODUODENOSCOPY N/A 04/23/2017   Procedure: ESOPHAGOGASTRODUODENOSCOPY (EGD);  Surgeon: Lin Landsman, MD;  Location: [facility];  Service: Gastroenterology;  Laterality: N/A;  Latex allergy sleep apnea  . POLYPECTOMY  05/29/2015   Procedure: POLYPECTOMY;  Surgeon: Lucilla Lame, MD;  Location: [facility];  Service: Endoscopy;;    Family History  Problem Relation Age of Onset  . Diabetes Sister   . Anxiety disorder Sister   . Depression Sister   . Hypertension Sister   . Cirrhosis Mother   . Diabetes Mother   .  Cirrhosis Father   . Breast cancer Sister 15  . Heart attack Brother   . Alcohol abuse Brother   . Drug abuse Brother   . Prostate cancer Neg Hx   . Kidney cancer Neg Hx   . Bladder Cancer Neg Hx     Social History   Socioeconomic History  . Marital status: Married    Spouse name: clayton sr  . Number of children: 3  . Years of education: Not on file  . Highest education level: GED or equivalent  Occupational History  . Not on file  Tobacco Use  . Smoking status: Never Smoker  . Smokeless tobacco: Never Used  Substance and Sexual Activity  . Alcohol use: No    Alcohol/week: 0.0 standard drinks  . Drug use: No  . Sexual activity: Yes    Partners: Male  Other Topics Concern  . Not on file  Social History Narrative   Lives with husband and one daughter other kids are out of  the house   She does not work, husband on disability    Social Determinants of Health   Financial Resource Strain:   . Difficulty of Paying Living Expenses: Not on file  Food Insecurity:   . Worried About Charity fundraiser in the Last Year: Not on file  . Ran Out of Food in the Last Year: Not on file  Transportation Needs:   . Lack of Transportation (Medical): Not on file  . Lack of Transportation (Non-Medical): Not on file  Physical Activity:   . Days of Exercise per Week: Not on file  . Minutes of Exercise per Session: Not on file  Stress:   . Feeling of Stress : Not on file  Social Connections:   . Frequency of Communication with Friends and Family: Not on file  . Frequency of Social Gatherings with Friends and Family: Not on file  . Attends Religious Services: Not on file  . Active Member of Clubs or Organizations: Not on file  . Attends Archivist Meetings: Not on file  . Marital Status: Not on file  Intimate Partner Violence:   . Fear of Current or Ex-Partner: Not on file  . Emotionally Abused: Not on file  . Physically Abused: Not on file  . Sexually Abused: Not on file     Current Outpatient Medications:  .  aspirin 81 MG EC tablet, Take 81 mg by mouth daily.  , Disp: , Rfl:  .  atorvastatin (LIPITOR) 40 MG tablet, Take 1 tablet (40 mg total) by mouth at bedtime., Disp: 90 tablet, Rfl: 1 .  cloNIDine (CATAPRES) 0.1 MG tablet, TAKE 1 TABLET BY MOUTH TWICE DAILY. START AT NIGHT FOR HOTFLASHES AND TRY TAKING TWICE DAILY IF TOLERATED, Disp: 180 tablet, Rfl: 0 .  conjugated estrogens (PREMARIN) vaginal cream, Place 1 Applicatorful vaginally daily. Apply 0.5mg  (pea-sized amount)  just inside the vaginal introitus with a finger-tip every night for two weeks and then Monday, Wednesday and Friday nights., Disp: 30 g, Rfl: 12 .  cyclobenzaprine (FLEXERIL) 5 MG tablet, Take 1 tablet (5 mg total) by mouth 3 (three) times daily as needed., Disp: 30 tablet, Rfl: 0 .   diclofenac (VOLTAREN) 75 MG EC tablet, Take 1 tablet (75 mg total) by mouth 2 (two) times daily., Disp: 20 tablet, Rfl: 0 .  DULoxetine (CYMBALTA) 30 MG capsule, Take 3 capsules (90 mg total) by mouth daily., Disp: 270 capsule, Rfl: 1 .  EQ ALLERGY RELIEF 10 MG  tablet, TAKE ONE TABLET BY MOUTH ONCE DAILY, Disp: 90 tablet, Rfl: 1 .  fluticasone (FLONASE) 50 MCG/ACT nasal spray, Place 2 sprays into both nostrils daily. , Disp: , Rfl:  .  gabapentin (NEURONTIN) 400 MG capsule, 1 capsule in the morning and at midday, 2 in the evening, Disp: 360 capsule, Rfl: 1 .  glucose blood test strip, Use once daily., Disp: 100 each, Rfl: 12 .  hydrOXYzine (ATARAX/VISTARIL) 25 MG tablet, Take 1 tablet (25 mg total) by mouth 3 (three) times daily as needed (for severe anxiety only)., Disp: 270 tablet, Rfl: 1 .  ketoconazole (NIZORAL) 2 % cream, Apply 1 application topically daily., Disp: 60 g, Rfl: 0 .  levothyroxine (SYNTHROID) 112 MCG tablet, Take 1 tablet (112 mcg total) by mouth daily before breakfast., Disp: 90 tablet, Rfl: 0 .  metFORMIN (GLUCOPHAGE-XR) 750 MG 24 hr tablet, Take 1 tablet (750 mg total) by mouth daily with breakfast., Disp: 90 tablet, Rfl: 1 .  metoprolol succinate (TOPROL-XL) 50 MG 24 hr tablet, TAKE 1 TABLET BY MOUTH DAILY, Disp: 90 tablet, Rfl: 1 .  Microlet Lancets MISC, USE ONCE D., Disp: , Rfl:  .  mometasone (ASMANEX, 120 METERED DOSES,) 220 MCG/INH inhaler, Inhale 2 puffs into the lungs daily., Disp: 3 Inhaler, Rfl: 1 .  montelukast (SINGULAIR) 10 MG tablet, Take 1 tablet (10 mg total) by mouth at bedtime., Disp: 90 tablet, Rfl: 1 .  nitroGLYCERIN (NITROSTAT) 0.4 MG SL tablet, Place 1 tablet (0.4 mg total) under the tongue every 5 (five) minutes as needed., Disp: 25 tablet, Rfl: 0 .  olmesartan-hydrochlorothiazide (BENICAR HCT) 40-25 MG tablet, Take 1 tablet by mouth daily., Disp: 90 tablet, Rfl: 1 .  omeprazole (PRILOSEC) 40 MG capsule, TAKE 1 CAPSULE BY MOUTH DAILY, Disp: 90 capsule,  Rfl: 1 .  Plecanatide (TRULANCE) 3 MG TABS, Take 1 tablet by mouth daily. In place of linzess, Disp: 90 tablet, Rfl: 1 .  QUEtiapine (SEROQUEL) 50 MG tablet, Take 1-1.5 tablets (50-75 mg total) by mouth at bedtime., Disp: 135 tablet, Rfl: 1 .  rizatriptan (MAXALT) 10 MG tablet, TAKE 1 TABLET BY MOUTH THREE TIMES DAILY AS NEEDED FOR  MIGRAINE, Disp: 10 tablet, Rfl: 3 .  Trospium Chloride 60 MG CP24, Take 1 capsule (60 mg total) by mouth daily., Disp: 90 capsule, Rfl: 3 .  VENTOLIN HFA 108 (90 Base) MCG/ACT inhaler, INHALE 2 PUFFS BY MOUTH INTO THE LUNGS EVERY 6 HOURS AS NEEDED FOR WHEEZING OR SHORTNESS OF BREATH, Disp: 54 g, Rfl: 0  Allergies  Allergen Reactions  . Latex Swelling    Pt unsure of reaction pt states something happened while in the hospital after surgery.    I personally reviewed active problem list, medication list, allergies, family history, social history, health maintenance with the patient/caregiver today.   ROS  Constitutional: Negative for fever or weight change.  Respiratory: Negative for cough, positive for intermittent  shortness of breath.   Cardiovascular: Negative for chest pain or palpitations.  Gastrointestinal: Negative for abdominal pain, no bowel changes.  Musculoskeletal: Positive  for gait problem but no  joint swelling.  Skin: Negative for rash.  Neurological: Negative for dizziness, positive for intermittent headache.  No other specific complaints in a complete review of systems (except as listed in HPI above).  Objective  Virtual encounter, vitals obtained.   Vitals:   07/14/19 1027  BP: (!) 152/93    There is no height or weight on file to calculate BMI.  Physical Exam  Awake, alert and oriented   Results for orders placed or performed in visit on 07/13/19 (from the past 72 hour(s))  POCT Urinalysis Dipstick     Status: Abnormal   Collection Time: 07/13/19  5:16 PM  Result Value Ref Range   Color, UA yellow    Clarity, UA cloudy     Glucose, UA Negative Negative   Bilirubin, UA neg    Ketones, UA neg    Spec Grav, UA 1.020 1.010 - 1.025   Blood, UA trace    pH, UA 6.5 5.0 - 8.0   Protein, UA Negative Negative   Urobilinogen, UA 0.2 0.2 or 1.0 E.U./dL   Nitrite, UA neg    Leukocytes, UA Large (3+) (A) Negative   Appearance cloudy    Odor foul   POCT HgB A1C     Status: Abnormal   Collection Time: 07/13/19  5:17 PM  Result Value Ref Range   Hemoglobin A1C 7.1 (A) 4.0 - 5.6 %   HbA1c POC (<> result, manual entry)     HbA1c, POC (prediabetic range)     HbA1c, POC (controlled diabetic range)      PHQ2/9: Depression screen [facility] 2/9 07/14/2019 04/14/2019 12/02/2018 11/27/2018 08/03/2018  Decreased Interest 3 0 3 1 2   Down, Depressed, Hopeless 1 1 2 2 2   PHQ - 2 Score 4 1 5 3 4   Altered sleeping 0 1 1 1 1   Tired, decreased energy 1 1 2 2 2   Change in appetite 0 1 1 0 2  Feeling bad or failure about yourself  1 3 1 1 1   Trouble concentrating 1 0 1 1 0  Moving slowly or fidgety/restless 0 0 1 1 1   Suicidal thoughts 0 1 0 1 0  PHQ-9 Score 7 8 12 10 11   Difficult doing work/chores Extremely dIfficult Very difficult Somewhat difficult Very difficult Very difficult  Some recent data might be hidden   PHQ-2/9 Result is positive.    Fall Risk: Fall Risk  04/14/2019 12/02/2018 11/27/2018 09/08/2018 08/03/2018  Falls in the past year? 1 1 0 0 0  Number falls in past yr: 1 0 0 0 0  Injury with Fall? 0 1 0 0 0  Comment - Knee Pain - - -  Follow up - - - - -     Assessment & Plan  1. Dyslipidemia associated with type 2 diabetes mellitus ([place])  - Lipid panel  2. Dysuria  - ciprofloxacin (CIPRO) 250 MG tablet; Take 1 tablet (250 mg total) by mouth 2 (two) times daily.  Dispense: 6 tablet; Refill: 0  3. OSA on CPAP  She wears it every night   4. Migraine without aura and without status migrainosus, not intractable  Stable  5. Depression, major, recurrent, moderate ([place])  Keep follow up with Dr. Shea Evans   6. Asthma,  mild intermittent, poorly controlled  stable  7. Hypothyroidism, adult  - TSH  8. GAD (generalized anxiety disorder)  Keep follow up with Dr. Shea Evans   9. Hypertension goal BP (blood pressure) < 140/90  - COMPLETE METABOLIC PANEL WITH GFR  10. Hypertension associated with type 2 diabetes mellitus ([facility])  Continue medications  11. Hypothyroidism due to acquired atrophy of thyroid  - levothyroxine (SYNTHROID) 112 MCG tablet; Take 1 tablet (112 mcg total) by mouth daily before breakfast.  Dispense: 90 tablet; Refill: 0  12. Hot flashes  - cloNIDine (CATAPRES) 0.1 MG tablet; Take 1 tablet (0.1 mg total) by mouth 2 (two)  times daily.  Dispense: 180 tablet; Refill: 0  13. Iron deficiency anemia, unspecified iron deficiency anemia type  - CBC with Differential/Platelet - Iron, TIBC and Ferritin Panel  I discussed the assessment and treatment plan with the patient. The patient was provided an opportunity to ask questions and all were answered. The patient agreed with the plan and demonstrated an understanding of the instructions.  The patient was advised to call back or seek an in-person evaluation if the symptoms worsen or if the condition fails to improve as anticipated.  I provided 25  minutes of non-face-to-face time during this encounter.

## 2019-07-16 LAB — URINE CULTURE
MICRO NUMBER:: 1204940
SPECIMEN QUALITY:: ADEQUATE

## 2019-08-12 ENCOUNTER — Encounter: Payer: Self-pay | Admitting: Psychiatry

## 2019-08-12 ENCOUNTER — Ambulatory Visit (INDEPENDENT_AMBULATORY_CARE_PROVIDER_SITE_OTHER): Payer: BLUE CROSS/BLUE SHIELD | Admitting: Psychiatry

## 2019-08-12 ENCOUNTER — Other Ambulatory Visit: Payer: Self-pay

## 2019-08-12 DIAGNOSIS — F411 Generalized anxiety disorder: Secondary | ICD-10-CM

## 2019-08-12 DIAGNOSIS — F330 Major depressive disorder, recurrent, mild: Secondary | ICD-10-CM

## 2019-08-12 MED ORDER — DULOXETINE HCL 60 MG PO CPEP: 60.0000 mg | ORAL_CAPSULE | Freq: Two times a day (BID) | ORAL | 0 refills | Status: DC

## 2019-08-12 NOTE — Progress Notes (Signed)
Virtual Visit via Telephone Note  I connected with Beverly Barrett on 08/12/19 at 11:30 AM EST by telephone and verified that I am speaking with the correct person using two identifiers.   I discussed the limitations, risks, security and privacy concerns of performing an evaluation and management service by telephone and the availability of in person appointments. I also discussed with the patient that there may be a patient responsible charge related to this service. The patient expressed understanding and agreed to proceed.     I discussed the assessment and treatment plan with the patient. The patient was provided an opportunity to ask questions and all were answered. The patient agreed with the plan and demonstrated an understanding of the instructions.   The patient was advised to call back or seek an in-person evaluation if the symptoms worsen or if the condition fails to improve as anticipated.   [place] MD OP Progress Note  08/12/2019 11:50 AM Beverly Barrett  MRN:  [id number]  Chief Complaint:  Chief Complaint    Follow-up     HPI: Beverly Barrett is a 59 year old female, lives in [place], has a history of MDD, GAD, arthritis, chronic pain, history of CPAP, migraine headaches, hypothyroidism was evaluated by phone today.  Patient preferred to do a phone call.  Patient today reports she is currently struggling with depression and anxiety symptoms.  She reports some recent situational stressors which she does not elaborate much.  She reports this has an impact on her mood.  She reports sleep however continues to be good on the Seroquel.  Patient also reports she had recent UTI and was treated for the same.  She is recovering well.  Patient denies any suicidality, homicidality or perceptual disturbances.  Patient is interested in dosage increase of her Cymbalta.  She denies any other concerns today. Visit Diagnosis:    ICD-10-CM   1. MDD (major depressive disorder), recurrent  episode, mild (HCC)  F33.0 DULoxetine (CYMBALTA) 60 MG capsule  2. GAD (generalized anxiety disorder)  F41.1 DULoxetine (CYMBALTA) 60 MG capsule    Past Psychiatric History: I have reviewed past psychiatric history from my progress note on 01/21/2018.  Past Medical History:  Past Medical History:  Diagnosis Date  . Anginal pain ([place])    none in approx 2 yrs  . Anxiety   . Asthma   . Chronic lower back pain   . Common migraine with intractable migraine 09/20/2016  . Depression    suicidal ideation  . Diabetes mellitus without complication ([place])   . GERD (gastroesophageal reflux disease)   . Headache    migraines -3x/wk  . Hyperlipidemia   . Hypertension   . Hypothyroidism   . Motion sickness    cars  . Multilevel degenerative disc disease   . Shortness of breath dyspnea   . Sleep apnea    CPAP  . Vertigo   . Wears dentures    partial upper    Past Surgical History:  Procedure Laterality Date  . ABDOMINAL HYSTERECTOMY    . bladder tach  1995  . CESAREAN SECTION    . COLONOSCOPY WITH PROPOFOL N/A 05/29/2015   Procedure: COLONOSCOPY WITH PROPOFOL;  Surgeon: Lucilla Lame, MD;  Location: [facility];  Service: Endoscopy;  Laterality: N/A;  Latex allergy sleep apnea - no CPAP machine (yet)  . ESOPHAGOGASTRODUODENOSCOPY N/A 04/23/2017   Procedure: ESOPHAGOGASTRODUODENOSCOPY (EGD);  Surgeon: Lin Landsman, MD;  Location: [facility];  Service: Gastroenterology;  Laterality: N/A;  Latex allergy sleep  apnea  . POLYPECTOMY  05/29/2015   Procedure: POLYPECTOMY;  Surgeon: Lucilla Lame, MD;  Location: [facility];  Service: Endoscopy;;    Family Psychiatric History: Reviewed family psychiatric history from my progress note on 01/21/2018.  Family History:  Family History  Problem Relation Age of Onset  . Diabetes Sister   . Anxiety disorder Sister   . Depression Sister   . Hypertension Sister   . Cirrhosis Mother   . Diabetes Mother   . Cirrhosis  Father   . Breast cancer Sister 25  . Heart attack Brother   . Alcohol abuse Brother   . Drug abuse Brother   . Prostate cancer Neg Hx   . Kidney cancer Neg Hx   . Bladder Cancer Neg Hx     Social History: Reviewed social history from my progress note on 01/21/2018. Social History   Socioeconomic History  . Marital status: Married    Spouse name: clayton sr  . Number of children: 3  . Years of education: Not on file  . Highest education level: GED or equivalent  Occupational History  . Not on file  Tobacco Use  . Smoking status: Never Smoker  . Smokeless tobacco: Never Used  Substance and Sexual Activity  . Alcohol use: No    Alcohol/week: 0.0 standard drinks  . Drug use: No  . Sexual activity: Yes    Partners: Male  Other Topics Concern  . Not on file  Social History Narrative   Lives with husband and one daughter other kids are out of the house   She does not work, husband on disability    Social Determinants of Health   Financial Resource Strain: High Risk  . Difficulty of Paying Living Expenses: Hard  Food Insecurity: Food Insecurity Present  . Worried About Charity fundraiser in the Last Year: Sometimes true  . Ran Out of Food in the Last Year: Sometimes true  Transportation Needs: No Transportation Needs  . Lack of Transportation (Medical): No  . Lack of Transportation (Non-Medical): No  Physical Activity: Insufficiently Active  . Days of Exercise per Week: 6 days  . Minutes of Exercise per Session: 10 min  Stress: Stress Concern Present  . Feeling of Stress : To some extent  Social Connections: Slightly Isolated  . Frequency of Communication with Friends and Family: More than three times a week  . Frequency of Social Gatherings with Friends and Family: More than three times a week  . Attends Religious Services: More than 4 times per year  . Active Member of Clubs or Organizations: No  . Attends Archivist Meetings: Never  . Marital Status:  Married    Allergies:  Allergies  Allergen Reactions  . Latex Swelling    Pt unsure of reaction pt states something happened while in the hospital after surgery.    Metabolic Disorder Labs: Lab Results  Component Value Date   HGBA1C 7.1 (A) 07/13/2019   MPG 151 12/09/2018   MPG 143 08/18/2017   No results found for: PROLACTIN Lab Results  Component Value Date   CHOL 159 12/09/2018   TRIG 305 (H) 12/09/2018   HDL 38 (L) 12/09/2018   CHOLHDL 4.2 12/09/2018   VLDL 44 (H) 02/12/2017   LDLCALC 84 12/09/2018   LDLCALC 85 03/23/2018   Lab Results  Component Value Date   TSH 2.74 10/26/2018   TSH 7.94 (H) 09/08/2018    Therapeutic Level Labs: No results found for: LITHIUM No  results found for: VALPROATE No components found for:  CBMZ  Current Medications: Current Outpatient Medications  Medication Sig Dispense Refill  . amLODipine (NORVASC) 2.5 MG tablet Take 1 tablet (2.5 mg total) by mouth daily. 30 tablet 0  . aspirin 81 MG EC tablet Take 81 mg by mouth daily.      Marland Kitchen atorvastatin (LIPITOR) 40 MG tablet Take 1 tablet (40 mg total) by mouth at bedtime. 90 tablet 1  . ciprofloxacin (CIPRO) 250 MG tablet Take 1 tablet (250 mg total) by mouth 2 (two) times daily. 6 tablet 0  . cloNIDine (CATAPRES) 0.1 MG tablet Take 1 tablet (0.1 mg total) by mouth 2 (two) times daily. 180 tablet 0  . conjugated estrogens (PREMARIN) vaginal cream Place 1 Applicatorful vaginally daily. Apply 0.5mg  (pea-sized amount)  just inside the vaginal introitus with a finger-tip every night for two weeks and then Monday, Wednesday and Friday nights. 30 g 12  . cyclobenzaprine (FLEXERIL) 5 MG tablet Take 1 tablet (5 mg total) by mouth 3 (three) times daily as needed. 30 tablet 0  . DULoxetine (CYMBALTA) 60 MG capsule Take 1 capsule (60 mg total) by mouth 2 (two) times daily. 180 capsule 0  . EQ ALLERGY RELIEF 10 MG tablet TAKE ONE TABLET BY MOUTH ONCE DAILY 90 tablet 1  . fluticasone (FLONASE) 50 MCG/ACT  nasal spray Place 2 sprays into both nostrils daily.     Marland Kitchen gabapentin (NEURONTIN) 400 MG capsule 1 capsule in the morning and at midday, 2 in the evening 360 capsule 1  . glucose blood test strip Use once daily. 100 each 12  . hydrOXYzine (ATARAX/VISTARIL) 25 MG tablet Take 1 tablet (25 mg total) by mouth 3 (three) times daily as needed (for severe anxiety only). 270 tablet 1  . ketoconazole (NIZORAL) 2 % cream Apply 1 application topically daily. 60 g 0  . levothyroxine (SYNTHROID) 112 MCG tablet Take 1 tablet (112 mcg total) by mouth daily before breakfast. 90 tablet 0  . metFORMIN (GLUCOPHAGE-XR) 750 MG 24 hr tablet Take 1 tablet (750 mg total) by mouth daily with breakfast. 90 tablet 1  . metoprolol succinate (TOPROL-XL) 50 MG 24 hr tablet TAKE 1 TABLET BY MOUTH DAILY 90 tablet 1  . Microlet Lancets MISC USE ONCE D.    . mometasone (ASMANEX, 120 METERED DOSES,) 220 MCG/INH inhaler Inhale 2 puffs into the lungs daily. 3 Inhaler 1  . montelukast (SINGULAIR) 10 MG tablet Take 1 tablet (10 mg total) by mouth at bedtime. 90 tablet 1  . nitroGLYCERIN (NITROSTAT) 0.4 MG SL tablet Place 1 tablet (0.4 mg total) under the tongue every 5 (five) minutes as needed. 25 tablet 0  . olmesartan-hydrochlorothiazide (BENICAR HCT) 40-25 MG tablet Take 1 tablet by mouth daily. 90 tablet 1  . omeprazole (PRILOSEC) 40 MG capsule TAKE 1 CAPSULE BY MOUTH DAILY 90 capsule 1  . Plecanatide (TRULANCE) 3 MG TABS Take 1 tablet by mouth daily. In place of linzess 90 tablet 1  . QUEtiapine (SEROQUEL) 50 MG tablet Take 1-1.5 tablets (50-75 mg total) by mouth at bedtime. 135 tablet 1  . rizatriptan (MAXALT) 10 MG tablet TAKE 1 TABLET BY MOUTH THREE TIMES DAILY AS NEEDED FOR  MIGRAINE 10 tablet 3  . Trospium Chloride 60 MG CP24 Take 1 capsule (60 mg total) by mouth daily. 90 capsule 3  . VENTOLIN HFA 108 (90 Base) MCG/ACT inhaler INHALE 2 PUFFS BY MOUTH INTO THE LUNGS EVERY 6 HOURS AS NEEDED FOR WHEEZING OR  SHORTNESS OF BREATH  54 g 0   No current facility-administered medications for this visit.     Musculoskeletal: Strength & Muscle Tone: UTA Gait & Station: Reports as WNL Patient leans: N/A  Psychiatric Specialty Exam: Review of Systems  Psychiatric/Behavioral: Positive for dysphoric mood. The patient is nervous/anxious.   All other systems reviewed and are negative.   There were no vitals taken for this visit.There is no height or weight on file to calculate BMI.  General Appearance: UTA  Eye Contact:  UTA  Speech:  Clear and Coherent  Volume:  Normal  Mood:  Anxious and Depressed  Affect:  UTA  Thought Process:  Goal Directed and Descriptions of Associations: Intact  Orientation:  Full (Time, Place, and Person)  Thought Content: Logical   Suicidal Thoughts:  No  Homicidal Thoughts:  No  Memory:  Immediate;   Fair Recent;   Fair Remote;   Fair  Judgement:  Fair  Insight:  Fair  Psychomotor Activity:  UTA  Concentration:  Concentration: Fair and Attention Span: Fair  Recall:  [employer] of Knowledge: Fair  Language: Fair  Akathisia:  No  Handed:  Right  AIMS (if indicated): Denies tremors, rigidity  Assets:  Communication Skills Desire for Improvement Housing Social Support  ADL's:  Intact  Cognition: WNL  Sleep:  Fair   Screenings: GAD-7     Office Visit from 03/19/2018 in [facility] Office Visit from 03/04/2016 in [facility] Office Visit from 12/20/2015 in [facility]  Total GAD-7 Score  14  18  18     PHQ2-9     Office Visit from 07/14/2019 in [facility] Office Visit from 04/14/2019 in [facility] Office Visit from 12/02/2018 in [facility] Office Visit from 11/27/2018 in [facility] Office Visit from 08/03/2018 in [facility]  PHQ-2 Total Score  4  1  5  3  4   PHQ-9 Total Score  7  8  12  10  11        Assessment and Plan:  Beverly Barrett is a 59 year old African-American female who is married, lives in [place], has a history of MDD, GAD, insomnia, OSA on CPAP, migraine headaches, hypothyroidism, arthritis was evaluated by telemedicine today.  Patient with psychosocial stressors of COVID-19 pandemic as well as her own situational stressors is currently struggling with anxiety and depressive symptoms and will benefit from medication readjustment.  Plan as noted below.  Plan MDD-unstable Increase Cymbalta to 60 mg p.o. twice daily Seroquel 50 to 75 mg p.o. nightly  GAD-unstable Cymbalta increased as discussed above. Hydroxyzine 25 mg p.o. 3 times daily as needed  Follow-up in clinic in 4 weeks or sooner if needed.  February 16 at 11 AM  I have spent atleast 20 minutes non face to face with patient today. More than 50 % of the time was spent for  ordering medications and test ,psychoeducation and supportive psychotherapy and care coordination,as well as documenting clinical information in electronic health record. This note was generated in part or whole with voice recognition software. Voice recognition is usually quite accurate but there are transcription errors that can and very often do occur. I apologize for any typographical errors that were not detected and corrected.       Ursula Alert, MD 08/12/2019, 11:50 AM

## 2019-08-18 ENCOUNTER — Other Ambulatory Visit: Payer: Self-pay

## 2019-08-18 ENCOUNTER — Encounter: Payer: Self-pay | Admitting: Neurology

## 2019-08-18 ENCOUNTER — Ambulatory Visit: Payer: 59 | Admitting: Neurology

## 2019-08-18 VITALS — BP 150/90 | HR 70 | Temp 97.5°F | Ht 61.0 in | Wt 268.0 lb

## 2019-08-18 DIAGNOSIS — G43019 Migraine without aura, intractable, without status migrainosus: Secondary | ICD-10-CM

## 2019-08-18 MED ORDER — RIZATRIPTAN BENZOATE 10 MG PO TABS: ORAL_TABLET | ORAL | 3 refills | Status: DC

## 2019-08-18 MED ORDER — AJOVY 225 MG/1.5ML ~~LOC~~ SOAJ: 225.0000 mg | SUBCUTANEOUS | 6 refills | Status: DC

## 2019-08-18 NOTE — Progress Notes (Signed)
PATIENT: Beverly Barrett DOB: 03-Dec-1960  REASON FOR VISIT: follow up HISTORY FROM: patient  HISTORY OF PRESENT ILLNESS: Today 08/18/19  Beverly Barrett is a 59 year old female with history of morbid obesity and low back pain.  She also has history of headaches, taking gabapentin.  After last visit gabapentin was increased to 400 mg in the morning, midday, 800 mg in the evening. She reports some improvement in her headaches with increase gabapentin, but still having on average 2-3 headaches a week.  If she takes Maxalt early, it works well to relieve the headache.  The headache location varies, but is associated with photophobia, phonophobia, and nausea.  She does report an occasional dull sensation to the right side of her head.  She is not going to the [facility] for back pain, she is trying to take care of the medical expenses before going back. She is doing some home back exercises.  She denies any new problems or concerns.  She presents today for evaluation unaccompanied.  HISTORY 04/07/2019 Dr. Jannifer Franklin: Beverly Barrett is a 59 year old right-handed black female with a history of morbid obesity and low back pain.  She is followed through the [facility], she is followed through orthopedic surgery and she gets injections in her back.  She continues to have a lot of back pain and pain down the left leg, she uses a cane for ambulation.  She has not had any falls since last seen.  She continues to have headaches, the headaches initially responded quite well to gabapentin, she was only having about 1 headache a week.  Now the headaches are becoming more frequent and she may be having 2 or 3 a week.  She notes that bright lights and weather changes and not eating well may bring on a headache.  She may get photophobia and phonophobia with the headache and have nausea.  She was to be set up for physical therapy for her back when last seen, this never happened.  She has been given exercises through her  orthopedic doctor to help her back.  She returns for an evaluation.  In the past, the patient has been on Topamax, she remains on Cymbalta, she takes Flexeril, she has been on Seroquel and metoprolol as well as the gabapentin.  The headaches remain an issue.   REVIEW OF SYSTEMS: Out of a complete 14 system review of symptoms, the patient complains only of the following symptoms, and all other reviewed systems are negative.  Headache  ALLERGIES: Allergies  Allergen Reactions  . Latex Swelling    Pt unsure of reaction pt states something happened while in the hospital after surgery.    HOME MEDICATIONS: Outpatient Medications Prior to Visit  Medication Sig Dispense Refill  . amLODipine (NORVASC) 2.5 MG tablet Take 1 tablet (2.5 mg total) by mouth daily. 30 tablet 0  . aspirin 81 MG EC tablet Take 81 mg by mouth daily.      Marland Kitchen atorvastatin (LIPITOR) 40 MG tablet Take 1 tablet (40 mg total) by mouth at bedtime. 90 tablet 1  . ciprofloxacin (CIPRO) 250 MG tablet Take 1 tablet (250 mg total) by mouth 2 (two) times daily. 6 tablet 0  . cloNIDine (CATAPRES) 0.1 MG tablet Take 1 tablet (0.1 mg total) by mouth 2 (two) times daily. 180 tablet 0  . conjugated estrogens (PREMARIN) vaginal cream Place 1 Applicatorful vaginally daily. Apply 0.5mg  (pea-sized amount)  just inside the vaginal introitus with a finger-tip every night for two weeks  and then Monday, Wednesday and Friday nights. 30 g 12  . cyclobenzaprine (FLEXERIL) 5 MG tablet Take 1 tablet (5 mg total) by mouth 3 (three) times daily as needed. 30 tablet 0  . DULoxetine (CYMBALTA) 60 MG capsule Take 1 capsule (60 mg total) by mouth 2 (two) times daily. 180 capsule 0  . EQ ALLERGY RELIEF 10 MG tablet TAKE ONE TABLET BY MOUTH ONCE DAILY 90 tablet 1  . fluticasone (FLONASE) 50 MCG/ACT nasal spray Place 2 sprays into both nostrils daily.     Marland Kitchen gabapentin (NEURONTIN) 400 MG capsule 1 capsule in the morning and at midday, 2 in the evening 360 capsule 1   . glucose blood test strip Use once daily. 100 each 12  . hydrOXYzine (ATARAX/VISTARIL) 25 MG tablet Take 1 tablet (25 mg total) by mouth 3 (three) times daily as needed (for severe anxiety only). 270 tablet 1  . ketoconazole (NIZORAL) 2 % cream Apply 1 application topically daily. 60 g 0  . levothyroxine (SYNTHROID) 112 MCG tablet Take 1 tablet (112 mcg total) by mouth daily before breakfast. 90 tablet 0  . metFORMIN (GLUCOPHAGE-XR) 750 MG 24 hr tablet Take 1 tablet (750 mg total) by mouth daily with breakfast. 90 tablet 1  . metoprolol succinate (TOPROL-XL) 50 MG 24 hr tablet TAKE 1 TABLET BY MOUTH DAILY 90 tablet 1  . Microlet Lancets MISC USE ONCE D.    . mometasone (ASMANEX, 120 METERED DOSES,) 220 MCG/INH inhaler Inhale 2 puffs into the lungs daily. 3 Inhaler 1  . montelukast (SINGULAIR) 10 MG tablet Take 1 tablet (10 mg total) by mouth at bedtime. 90 tablet 1  . nitroGLYCERIN (NITROSTAT) 0.4 MG SL tablet Place 1 tablet (0.4 mg total) under the tongue every 5 (five) minutes as needed. 25 tablet 0  . olmesartan-hydrochlorothiazide (BENICAR HCT) 40-25 MG tablet Take 1 tablet by mouth daily. 90 tablet 1  . omeprazole (PRILOSEC) 40 MG capsule TAKE 1 CAPSULE BY MOUTH DAILY 90 capsule 1  . Plecanatide (TRULANCE) 3 MG TABS Take 1 tablet by mouth daily. In place of linzess 90 tablet 1  . QUEtiapine (SEROQUEL) 50 MG tablet Take 1-1.5 tablets (50-75 mg total) by mouth at bedtime. 135 tablet 1  . rizatriptan (MAXALT) 10 MG tablet TAKE 1 TABLET BY MOUTH THREE TIMES DAILY AS NEEDED FOR  MIGRAINE 10 tablet 3  . Trospium Chloride 60 MG CP24 Take 1 capsule (60 mg total) by mouth daily. 90 capsule 3  . VENTOLIN HFA 108 (90 Base) MCG/ACT inhaler INHALE 2 PUFFS BY MOUTH INTO THE LUNGS EVERY 6 HOURS AS NEEDED FOR WHEEZING OR SHORTNESS OF BREATH 54 g 0   No facility-administered medications prior to visit.    PAST MEDICAL HISTORY: Past Medical History:  Diagnosis Date  . Anginal pain ([place])    none in  approx 2 yrs  . Anxiety   . Asthma   . Chronic lower back pain   . Common migraine with intractable migraine 09/20/2016  . Depression    suicidal ideation  . Diabetes mellitus without complication ([place])   . GERD (gastroesophageal reflux disease)   . Headache    migraines -3x/wk  . Hyperlipidemia   . Hypertension   . Hypothyroidism   . Motion sickness    cars  . Multilevel degenerative disc disease   . Shortness of breath dyspnea   . Sleep apnea    CPAP  . Vertigo   . Wears dentures    partial upper  PAST SURGICAL HISTORY: Past Surgical History:  Procedure Laterality Date  . ABDOMINAL HYSTERECTOMY    . bladder tach  1995  . CESAREAN SECTION    . COLONOSCOPY WITH PROPOFOL N/A 05/29/2015   Procedure: COLONOSCOPY WITH PROPOFOL;  Surgeon: Lucilla Lame, MD;  Location: [facility];  Service: Endoscopy;  Laterality: N/A;  Latex allergy sleep apnea - no CPAP machine (yet)  . ESOPHAGOGASTRODUODENOSCOPY N/A 04/23/2017   Procedure: ESOPHAGOGASTRODUODENOSCOPY (EGD);  Surgeon: Lin Landsman, MD;  Location: [facility];  Service: Gastroenterology;  Laterality: N/A;  Latex allergy sleep apnea  . POLYPECTOMY  05/29/2015   Procedure: POLYPECTOMY;  Surgeon: Lucilla Lame, MD;  Location: [facility];  Service: Endoscopy;;    FAMILY HISTORY: Family History  Problem Relation Age of Onset  . Diabetes Sister   . Anxiety disorder Sister   . Depression Sister   . Hypertension Sister   . Cirrhosis Mother   . Diabetes Mother   . Cirrhosis Father   . Breast cancer Sister 42  . Heart attack Brother   . Alcohol abuse Brother   . Drug abuse Brother   . Prostate cancer Neg Hx   . Kidney cancer Neg Hx   . Bladder Cancer Neg Hx     SOCIAL HISTORY: Social History   Socioeconomic History  . Marital status: Married    Spouse name: clayton sr  . Number of children: 3  . Years of education: Not on file  . Highest education level: GED or equivalent  Occupational  History  . Not on file  Tobacco Use  . Smoking status: Never Smoker  . Smokeless tobacco: Never Used  Substance and Sexual Activity  . Alcohol use: No    Alcohol/week: 0.0 standard drinks  . Drug use: No  . Sexual activity: Yes    Partners: Male  Other Topics Concern  . Not on file  Social History Narrative   Lives with husband and one daughter other kids are out of the house   She does not work, husband on disability    Social Determinants of Health   Financial Resource Strain: High Risk  . Difficulty of Paying Living Expenses: Hard  Food Insecurity: Food Insecurity Present  . Worried About Charity fundraiser in the Last Year: Sometimes true  . Ran Out of Food in the Last Year: Sometimes true  Transportation Needs: No Transportation Needs  . Lack of Transportation (Medical): No  . Lack of Transportation (Non-Medical): No  Physical Activity: Insufficiently Active  . Days of Exercise per Week: 6 days  . Minutes of Exercise per Session: 10 min  Stress: Stress Concern Present  . Feeling of Stress : To some extent  Social Connections: Slightly Isolated  . Frequency of Communication with Friends and Family: More than three times a week  . Frequency of Social Gatherings with Friends and Family: More than three times a week  . Attends Religious Services: More than 4 times per year  . Active Member of Clubs or Organizations: No  . Attends Archivist Meetings: Never  . Marital Status: Married  Human resources officer Violence: Not At Risk  . Fear of Current or Ex-Partner: No  . Emotionally Abused: No  . Physically Abused: No  . Sexually Abused: No   PHYSICAL EXAM  Vitals:   08/18/19 1007  BP: (!) 150/90  Pulse: 70  Temp: (!) 97.5 F (36.4 C)  Weight: 268 lb (121.6 kg)  Height: 5\' 1"  (1.549 m)  Body mass index is 50.64 kg/m.  Generalized: Well developed, in no acute distress   Neurological examination  Mentation: Alert oriented to time, place, history taking.  Follows all commands speech and language fluent Cranial nerve II-XII: Pupils were equal round reactive to light. Extraocular movements were full, visual field were full on confrontational test. Facial sensation and strength were normal. Head turning and shoulder shrug  were normal and symmetric. Motor: The motor testing reveals 5 over 5 strength of all 4 extremities. Good symmetric motor tone is noted throughout.  Sensory: Sensory testing is intact to soft touch on all 4 extremities. No evidence of extinction is noted.  Coordination: Cerebellar testing reveals good finger-nose-finger and heel-to-shin bilaterally.  Gait and station: Gait is normal. Tandem gait is normal. .  Reflexes: Deep tendon reflexes are symmetric and normal bilaterally.   DIAGNOSTIC DATA (LABS, IMAGING, TESTING) - I reviewed patient records, labs, notes, testing and imaging myself where available.  Lab Results  Component Value Date   WBC 10.6 12/09/2018   HGB 11.2 (L) 12/09/2018   HCT 32.8 (L) 12/09/2018   MCV 82.2 12/09/2018   PLT 311 12/09/2018      Component Value Date/Time   NA 142 12/09/2018 1052   NA 144 12/20/2015 1012   K 4.1 12/09/2018 1052   CL 105 12/09/2018 1052   CO2 27 12/09/2018 1052   GLUCOSE 141 (H) 12/09/2018 1052   BUN 18 12/09/2018 1052   BUN 17 12/20/2015 1012   CREATININE 0.86 12/09/2018 1052   CALCIUM 9.1 12/09/2018 1052   PROT 6.9 12/09/2018 1052   PROT 7.2 12/20/2015 1012   ALBUMIN 4.2 07/09/2016 1120   ALBUMIN 4.3 12/20/2015 1012   AST 21 12/09/2018 1052   ALT 22 12/09/2018 1052   ALKPHOS 63 07/09/2016 1120   BILITOT 0.4 12/09/2018 1052   BILITOT 0.2 12/20/2015 1012   GFRNONAA 74 12/09/2018 1052   GFRAA 86 12/09/2018 1052   Lab Results  Component Value Date   CHOL 159 12/09/2018   HDL 38 (L) 12/09/2018   LDLCALC 84 12/09/2018   TRIG 305 (H) 12/09/2018   CHOLHDL 4.2 12/09/2018   Lab Results  Component Value Date   HGBA1C 7.1 (A) 07/13/2019   No results found for:  VITAMINB12 Lab Results  Component Value Date   TSH 2.74 10/26/2018   ASSESSMENT AND PLAN 59 y.o. year old female  has a past medical history of Anginal pain ([place]), Anxiety, Asthma, Chronic lower back pain, Common migraine with intractable migraine (09/20/2016), Depression, Diabetes mellitus without complication ([place]), GERD (gastroesophageal reflux disease), Headache, Hyperlipidemia, Hypertension, Hypothyroidism, Motion sickness, Multilevel degenerative disc disease, Shortness of breath dyspnea, Sleep apnea, Vertigo, and Wears dentures. here with:  1.  Chronic low back pain 2.  Frequent migraine headache 3.  Morbid obesity  She continues to report 2-3 headaches a week, with migraine features.  I will start Ajovy monthly injection for headache prevention.  She has an allergy to latex, therefore I deferred Aimovig.  She will continue taking gabapentin, Maxalt as needed. Previously, she has tried Topamax, Cymbalta, Flexeril, Seroquel, metoprolol, and gabapentin.  She will follow-up here in 4 months or sooner if needed.  I did advise if her symptoms worsen or she develops any new symptoms she should let us know.  I did provide her with a co-pay card for Ajovy.    I spent 15 minutes with the patient. 50% of this time was spent discussing her plan of care.   Butler Denmark,  AGNP-C, DNP 08/18/2019, 10:22 AM [facility] [address] [place], [place] [address] [phone]

## 2019-08-18 NOTE — Progress Notes (Signed)
I have read the note, and I agree with the clinical assessment and plan.  Ziare Orrick K Vika Buske   

## 2019-08-18 NOTE — Patient Instructions (Signed)
Start taking Ajovy, once a month injection for migraine headache, continue taking gabapentin, maxalt as needed for headache, return in back 4 months

## 2019-08-23 ENCOUNTER — Ambulatory Visit: Payer: BLUE CROSS/BLUE SHIELD | Admitting: Family Medicine

## 2019-08-23 ENCOUNTER — Encounter: Payer: Self-pay | Admitting: Family Medicine

## 2019-08-23 ENCOUNTER — Other Ambulatory Visit: Payer: Self-pay

## 2019-08-23 VITALS — BP 128/74 | HR 82 | Temp 97.3°F | Resp 16 | Ht 62.0 in | Wt 266.1 lb

## 2019-08-23 DIAGNOSIS — I10 Essential (primary) hypertension: Secondary | ICD-10-CM

## 2019-08-23 DIAGNOSIS — K5909 Other constipation: Secondary | ICD-10-CM | POA: Diagnosis not present

## 2019-08-23 MED ORDER — AMLODIPINE BESYLATE 2.5 MG PO TABS: 2.5000 mg | ORAL_TABLET | Freq: Every day | ORAL | 1 refills | Status: DC

## 2019-08-23 MED ORDER — TRULANCE 3 MG PO TABS: 1.0000 | ORAL_TABLET | Freq: Every day | ORAL | 1 refills | Status: DC

## 2019-08-23 NOTE — Progress Notes (Signed)
Name: Beverly Barrett   MRN: [id number]    DOB: [birth year]   Date:08/23/2019       Progress Note  Subjective  Chief Complaint  Chief Complaint  Patient presents with  . Follow-up    1 month F/U BP and Labs  . Hypertension    Denies any symptoms-checks BP at home runs around 120-130/80's  . Constipation    Has tried Miralax and Ex-lax in the past that didn't help-Trulance helped her go daily which out medication could be a couple of days before her next BM Movement.    HPI  HTN: bp on her last visit was elevated and we added Norvasc, she is tolerating medication well, no chest pain or palpitation. BP today is at goal . Hot flashes improved with clonidine BID .   Chronic constipation: she has tried and failed treatment with Miralax and otc medications, she responded well to [place] and currently on trulance but she states insurance is requiring a PA. She states without medication it takes her 2-3 days to have a bowel movements and has to strain, with the medication she has a bowel movements at least once a day, no straining or blood in stools or pain.   Patient Active Problem List   Diagnosis Date Noted  . MDD (major depressive disorder), recurrent episode, moderate ([place]) 04/07/2019  . Chronic low back pain 12/31/2018  . Dental abscess 09/02/2018  . Grade II internal hemorrhoids 05/16/2017  . Mixed stress and urge urinary incontinence 11/12/2016  . Degenerative arthritis of lumbar spine 11/12/2016  . Common migraine with intractable migraine 09/20/2016  . Iron deficiency anemia 09/12/2016  . GAD (generalized anxiety disorder) 12/20/2015  . OSA on CPAP 12/20/2015  . Angina effort 12/20/2015  . Hyperglycemia 09/22/2015  . History of colonic polyps   . Benign neoplasm of sigmoid colon   . Frequency 04/14/2015  . Backache 04/14/2015  . Chronic pain of multiple joints 06/14/2014  . Dysmetabolic syndrome [id number]  . Acid reflux 06/14/2014  . Extreme obesity 06/14/2014  .  Arthritis, degenerative 06/14/2014  . Morbid obesity with BMI of 40.0-44.9, adult ([place]) 06/14/2014  . Hypothyroidism due to acquired atrophy of thyroid 08/18/2013  . Hypothyroidism, unspecified 08/18/2013  . Hypertension goal BP (blood pressure) < 140/90 12/07/2010  . Familial multiple lipoprotein-type hyperlipidemia 12/07/2010  . CN (constipation) 11/21/2009  . Allergic rhinitis 10/13/2008  . Asthma, mild intermittent, well-controlled 09/01/2008  . MDD (major depressive disorder), recurrent, in partial remission ([place]) 08/13/2007    Past Surgical History:  Procedure Laterality Date  . ABDOMINAL HYSTERECTOMY    . bladder tach  1995  . CESAREAN SECTION    . COLONOSCOPY WITH PROPOFOL N/A 05/29/2015   Procedure: COLONOSCOPY WITH PROPOFOL;  Surgeon: Lucilla Lame, MD;  Location: [facility];  Service: Endoscopy;  Laterality: N/A;  Latex allergy sleep apnea - no CPAP machine (yet)  . ESOPHAGOGASTRODUODENOSCOPY N/A 04/23/2017   Procedure: ESOPHAGOGASTRODUODENOSCOPY (EGD);  Surgeon: Lin Landsman, MD;  Location: [facility];  Service: Gastroenterology;  Laterality: N/A;  Latex allergy sleep apnea  . POLYPECTOMY  05/29/2015   Procedure: POLYPECTOMY;  Surgeon: Lucilla Lame, MD;  Location: [facility];  Service: Endoscopy;;    Family History  Problem Relation Age of Onset  . Diabetes Sister   . Anxiety disorder Sister   . Depression Sister   . Hypertension Sister   . Cirrhosis Mother   . Diabetes Mother   . Cirrhosis Father   . Breast cancer Sister 16  .  Heart attack Brother   . Alcohol abuse Brother   . Drug abuse Brother   . Prostate cancer Neg Hx   . Kidney cancer Neg Hx   . Bladder Cancer Neg Hx     Social History   Socioeconomic History  . Marital status: Married    Spouse name: clayton sr  . Number of children: 3  . Years of education: Not on file  . Highest education level: GED or equivalent  Occupational History  . Not on file  Tobacco Use   . Smoking status: Never Smoker  . Smokeless tobacco: Never Used  Substance and Sexual Activity  . Alcohol use: No    Alcohol/week: 0.0 standard drinks  . Drug use: No  . Sexual activity: Yes    Partners: Male  Other Topics Concern  . Not on file  Social History Narrative   Lives with husband and one daughter other kids are out of the house   She does not work, husband on disability    Social Determinants of Health   Financial Resource Strain: High Risk  . Difficulty of Paying Living Expenses: Hard  Food Insecurity: Food Insecurity Present  . Worried About Charity fundraiser in the Last Year: Sometimes true  . Ran Out of Food in the Last Year: Sometimes true  Transportation Needs: No Transportation Needs  . Lack of Transportation (Medical): No  . Lack of Transportation (Non-Medical): No  Physical Activity: Insufficiently Active  . Days of Exercise per Week: 6 days  . Minutes of Exercise per Session: 10 min  Stress: Stress Concern Present  . Feeling of Stress : To some extent  Social Connections: Slightly Isolated  . Frequency of Communication with Friends and Family: More than three times a week  . Frequency of Social Gatherings with Friends and Family: More than three times a week  . Attends Religious Services: More than 4 times per year  . Active Member of Clubs or Organizations: No  . Attends Archivist Meetings: Never  . Marital Status: Married  Human resources officer Violence: Not At Risk  . Fear of Current or Ex-Partner: No  . Emotionally Abused: No  . Physically Abused: No  . Sexually Abused: No     Current Outpatient Medications:  .  amLODipine (NORVASC) 2.5 MG tablet, Take 1 tablet (2.5 mg total) by mouth daily., Disp: 30 tablet, Rfl: 0 .  aspirin 81 MG EC tablet, Take 81 mg by mouth daily.  , Disp: , Rfl:  .  atorvastatin (LIPITOR) 40 MG tablet, Take 1 tablet (40 mg total) by mouth at bedtime., Disp: 90 tablet, Rfl: 1 .  cloNIDine (CATAPRES) 0.1 MG  tablet, Take 1 tablet (0.1 mg total) by mouth 2 (two) times daily., Disp: 180 tablet, Rfl: 0 .  conjugated estrogens (PREMARIN) vaginal cream, Place 1 Applicatorful vaginally daily. Apply 0.5mg  (pea-sized amount)  just inside the vaginal introitus with a finger-tip every night for two weeks and then Monday, Wednesday and Friday nights., Disp: 30 g, Rfl: 12 .  cyclobenzaprine (FLEXERIL) 5 MG tablet, Take 1 tablet (5 mg total) by mouth 3 (three) times daily as needed., Disp: 30 tablet, Rfl: 0 .  DULoxetine (CYMBALTA) 60 MG capsule, Take 1 capsule (60 mg total) by mouth 2 (two) times daily., Disp: 180 capsule, Rfl: 0 .  EQ ALLERGY RELIEF 10 MG tablet, TAKE ONE TABLET BY MOUTH ONCE DAILY, Disp: 90 tablet, Rfl: 1 .  fluticasone (FLONASE) 50 MCG/ACT nasal spray, Place 2  sprays into both nostrils daily. , Disp: , Rfl:  .  Fremanezumab-vfrm (AJOVY) 225 MG/1.5ML SOAJ, Inject 225 mg into the skin every 30 (thirty) days., Disp: 1 pen, Rfl: 6 .  gabapentin (NEURONTIN) 400 MG capsule, 1 capsule in the morning and at midday, 2 in the evening, Disp: 360 capsule, Rfl: 1 .  glucose blood test strip, Use once daily., Disp: 100 each, Rfl: 12 .  hydrOXYzine (ATARAX/VISTARIL) 25 MG tablet, Take 1 tablet (25 mg total) by mouth 3 (three) times daily as needed (for severe anxiety only)., Disp: 270 tablet, Rfl: 1 .  ketoconazole (NIZORAL) 2 % cream, Apply 1 application topically daily., Disp: 60 g, Rfl: 0 .  levothyroxine (SYNTHROID) 112 MCG tablet, Take 1 tablet (112 mcg total) by mouth daily before breakfast., Disp: 90 tablet, Rfl: 0 .  metFORMIN (GLUCOPHAGE-XR) 750 MG 24 hr tablet, Take 1 tablet (750 mg total) by mouth daily with breakfast., Disp: 90 tablet, Rfl: 1 .  metoprolol succinate (TOPROL-XL) 50 MG 24 hr tablet, TAKE 1 TABLET BY MOUTH DAILY, Disp: 90 tablet, Rfl: 1 .  Microlet Lancets MISC, USE ONCE D., Disp: , Rfl:  .  mometasone (ASMANEX, 120 METERED DOSES,) 220 MCG/INH inhaler, Inhale 2 puffs into the lungs  daily., Disp: 3 Inhaler, Rfl: 1 .  montelukast (SINGULAIR) 10 MG tablet, Take 1 tablet (10 mg total) by mouth at bedtime., Disp: 90 tablet, Rfl: 1 .  nitroGLYCERIN (NITROSTAT) 0.4 MG SL tablet, Place 1 tablet (0.4 mg total) under the tongue every 5 (five) minutes as needed., Disp: 25 tablet, Rfl: 0 .  olmesartan-hydrochlorothiazide (BENICAR HCT) 40-25 MG tablet, Take 1 tablet by mouth daily., Disp: 90 tablet, Rfl: 1 .  omeprazole (PRILOSEC) 40 MG capsule, TAKE 1 CAPSULE BY MOUTH DAILY, Disp: 90 capsule, Rfl: 1 .  QUEtiapine (SEROQUEL) 50 MG tablet, Take 1-1.5 tablets (50-75 mg total) by mouth at bedtime., Disp: 135 tablet, Rfl: 1 .  rizatriptan (MAXALT) 10 MG tablet, TAKE 1 TABLET BY MOUTH THREE TIMES DAILY AS NEEDED FOR  MIGRAINE, Disp: 10 tablet, Rfl: 3 .  Trospium Chloride 60 MG CP24, Take 1 capsule (60 mg total) by mouth daily., Disp: 90 capsule, Rfl: 3 .  VENTOLIN HFA 108 (90 Base) MCG/ACT inhaler, INHALE 2 PUFFS BY MOUTH INTO THE LUNGS EVERY 6 HOURS AS NEEDED FOR WHEEZING OR SHORTNESS OF BREATH, Disp: 54 g, Rfl: 0 .  ciprofloxacin (CIPRO) 250 MG tablet, Take 1 tablet (250 mg total) by mouth 2 (two) times daily. (Patient not taking: Reported on 08/23/2019), Disp: 6 tablet, Rfl: 0 .  Plecanatide (TRULANCE) 3 MG TABS, Take 1 tablet by mouth daily. In place of linzess (Patient not taking: Reported on 08/23/2019), Disp: 90 tablet, Rfl: 1  Allergies  Allergen Reactions  . Latex Swelling    Pt unsure of reaction pt states something happened while in the hospital after surgery.    I personally reviewed active problem list, medication list, allergies, family history with the patient/caregiver today.   ROS  Ten systems reviewed and is negative except as mentioned in HPI   Objective  Vitals:   08/23/19 1200  BP: 128/74  Pulse: 82  Resp: 16  Temp: (!) 97.3 F (36.3 C)  TempSrc: Temporal  SpO2: 93%  Weight: 266 lb 1.6 oz (120.7 kg)  Height: 5\' 2"  (1.575 m)    Body mass index is 48.67  kg/m.  Physical Exam  Constitutional: Patient appears well-developed and well-nourished. Obese No distress.  HEENT: head atraumatic, normocephalic, pupils  equal and reactive to light Cardiovascular: Normal rate, regular rhythm and normal heart sounds.  No murmur heard. No BLE edema. Pulmonary/Chest: Effort normal and breath sounds normal. No respiratory distress. Abdominal: Soft.  There is no tenderness. Psychiatric: Patient has a normal mood and affect. behavior is normal. Judgment and thought content normal.  Recent Results (from the past 2160 hour(s))  POCT Urinalysis Dipstick     Status: Abnormal   Collection Time: 07/13/19  5:16 PM  Result Value Ref Range   Color, UA yellow    Clarity, UA cloudy    Glucose, UA Negative Negative   Bilirubin, UA neg    Ketones, UA neg    Spec Grav, UA 1.020 1.010 - 1.025   Blood, UA trace    pH, UA 6.5 5.0 - 8.0   Protein, UA Negative Negative   Urobilinogen, UA 0.2 0.2 or 1.0 E.U./dL   Nitrite, UA neg    Leukocytes, UA Large (3+) (A) Negative   Appearance cloudy    Odor foul   POCT HgB A1C     Status: Abnormal   Collection Time: 07/13/19  5:17 PM  Result Value Ref Range   Hemoglobin A1C 7.1 (A) 4.0 - 5.6 %   HbA1c POC (<> result, manual entry)     HbA1c, POC (prediabetic range)     HbA1c, POC (controlled diabetic range)    Urine Culture     Status: Abnormal   Collection Time: 07/14/19 12:00 AM   Specimen: Urine  Result Value Ref Range   MICRO NUMBER: [id number]    SPECIMEN QUALITY: Adequate    Sample Source URINE    STATUS: FINAL    ISOLATE 1: Escherichia coli (A)     Comment: Greater than 100,000 CFU/mL of Escherichia coli      Susceptibility   Escherichia coli - URINE CULTURE, REFLEX    AMOX/CLAVULANIC 4 Sensitive     AMPICILLIN 8 Sensitive     AMPICILLIN/SULBACTAM 4 Sensitive     CEFAZOLIN* <=4 Not Reportable      * For infections other than uncomplicated UTIcaused by E. coli, K. pneumoniae or P. mirabilis:Cefazolin is  resistant if MIC > or = 8 mcg/mL.(Distinguishing susceptible versus intermediatefor isolates with MIC < or = 4 mcg/mL requiresadditional testing.)For uncomplicated UTI caused by E. coli,K. pneumoniae or P. mirabilis: Cefazolin issusceptible if MIC <32 mcg/mL and predictssusceptible to the oral agents cefaclor, cefdinir,cefpodoxime, cefprozil, cefuroxime, cephalexinand loracarbef.    CEFEPIME <=1 Sensitive     CEFTRIAXONE <=1 Sensitive     CIPROFLOXACIN <=0.25 Sensitive     LEVOFLOXACIN <=0.12 Sensitive     ERTAPENEM <=0.5 Sensitive     GENTAMICIN <=1 Sensitive     IMIPENEM <=0.25 Sensitive     NITROFURANTOIN <=16 Sensitive     PIP/TAZO <=4 Sensitive     TOBRAMYCIN <=1 Sensitive     TRIMETH/SULFA* <=20 Sensitive      * For infections other than uncomplicated UTIcaused by E. coli, K. pneumoniae or P. mirabilis:Cefazolin is resistant if MIC > or = 8 mcg/mL.(Distinguishing susceptible versus intermediatefor isolates with MIC < or = 4 mcg/mL requiresadditional testing.)For uncomplicated UTI caused by E. coli,K. pneumoniae or P. mirabilis: Cefazolin issusceptible if MIC <32 mcg/mL and predictssusceptible to the oral agents cefaclor, cefdinir,cefpodoxime, cefprozil, cefuroxime, cephalexinand loracarbef.Legend:S = Susceptible  I = IntermediateR = Resistant  NS = Not susceptible* = Not tested  NR = Not reported**NN = See antimicrobic comments     PHQ2/9: Depression screen [facility] 2/9 08/23/2019 07/14/2019 04/14/2019  12/02/2018 11/27/2018  Decreased Interest 2 3 0 3 1  Down, Depressed, Hopeless 2 1 1 2 2   PHQ - 2 Score 4 4 1 5 3   Altered sleeping 1 0 1 1 1   Tired, decreased energy 2 1 1 2 2   Change in appetite 1 0 1 1 0  Feeling bad or failure about yourself  2 1 3 1 1   Trouble concentrating 3 1 0 1 1  Moving slowly or fidgety/restless 0 0 0 1 1  Suicidal thoughts 0 0 1 0 1  PHQ-9 Score 13 7 8 12 10   Difficult doing work/chores Somewhat difficult Extremely dIfficult Very difficult Somewhat difficult Very  difficult  Some recent data might be hidden    phq 9 is positive - under the care of Dr. Shea Evans    Fall Risk: Fall Risk  08/23/2019 04/14/2019 12/02/2018 11/27/2018 09/08/2018  Falls in the past year? 1 1 1  0 0  Number falls in past yr: 0 1 0 0 0  Injury with Fall? 0 0 1 0 0  Comment - - Knee Pain - -  Follow up - - - - -    Functional Status Survey: Is the patient deaf or have difficulty hearing?: No Does the patient have difficulty seeing, even when wearing glasses/contacts?: No Does the patient have difficulty concentrating, remembering, or making decisions?: Yes Does the patient have difficulty walking or climbing stairs?: Yes Does the patient have difficulty dressing or bathing?: Yes Does the patient have difficulty doing errands alone such as visiting a doctor's office or shopping?: Yes   Assessment & Plan  1. Hypertension goal BP (blood pressure) < 140/90  Continue medication  2. Chronic constipation  We will try PA for Trulance

## 2019-08-26 ENCOUNTER — Telehealth: Payer: Self-pay | Admitting: Family Medicine

## 2019-08-26 NOTE — Telephone Encounter (Signed)
ERROR

## 2019-08-27 ENCOUNTER — Encounter: Payer: Self-pay | Admitting: Emergency Medicine

## 2019-08-27 ENCOUNTER — Encounter (HOSPITAL_COMMUNITY): Payer: Self-pay | Admitting: *Deleted

## 2019-08-27 ENCOUNTER — Other Ambulatory Visit: Payer: Self-pay

## 2019-08-27 ENCOUNTER — Emergency Department
Admission: EM | Admit: 2019-08-27 | Discharge: 2019-08-27 | Disposition: A | Discharge status: Other Acute Inpatient Hospital | Payer: 59 | Attending: Student | Admitting: Student

## 2019-08-27 ENCOUNTER — Inpatient Hospital Stay (HOSPITAL_COMMUNITY)
Admission: EM | Admit: 2019-08-27 | Discharge: 2019-08-30 | DRG: 184 | Disposition: A | Discharge status: Home / Self Care | Payer: 59 | Attending: Physician Assistant | Admitting: Physician Assistant

## 2019-08-27 ENCOUNTER — Emergency Department: Payer: 59

## 2019-08-27 DIAGNOSIS — Z9981 Dependence on supplemental oxygen: Secondary | ICD-10-CM | POA: Diagnosis not present

## 2019-08-27 DIAGNOSIS — K219 Gastro-esophageal reflux disease without esophagitis: Secondary | ICD-10-CM | POA: Diagnosis not present

## 2019-08-27 DIAGNOSIS — S27809A Unspecified injury of diaphragm, initial encounter: Secondary | ICD-10-CM | POA: Diagnosis not present

## 2019-08-27 DIAGNOSIS — R0789 Other chest pain: Secondary | ICD-10-CM | POA: Diagnosis not present

## 2019-08-27 DIAGNOSIS — Y92410 Unspecified street and highway as the place of occurrence of the external cause: Secondary | ICD-10-CM | POA: Diagnosis not present

## 2019-08-27 DIAGNOSIS — E876 Hypokalemia: Secondary | ICD-10-CM | POA: Diagnosis not present

## 2019-08-27 DIAGNOSIS — Z7984 Long term (current) use of oral hypoglycemic drugs: Secondary | ICD-10-CM | POA: Diagnosis not present

## 2019-08-27 DIAGNOSIS — Z20822 Contact with and (suspected) exposure to covid-19: Secondary | ICD-10-CM | POA: Diagnosis not present

## 2019-08-27 DIAGNOSIS — E119 Type 2 diabetes mellitus without complications: Secondary | ICD-10-CM | POA: Diagnosis present

## 2019-08-27 DIAGNOSIS — S0990XA Unspecified injury of head, initial encounter: Secondary | ICD-10-CM | POA: Insufficient documentation

## 2019-08-27 DIAGNOSIS — J309 Allergic rhinitis, unspecified: Secondary | ICD-10-CM | POA: Diagnosis not present

## 2019-08-27 DIAGNOSIS — E034 Atrophy of thyroid (acquired): Secondary | ICD-10-CM | POA: Insufficient documentation

## 2019-08-27 DIAGNOSIS — Z6841 Body Mass Index (BMI) 40.0 and over, adult: Secondary | ICD-10-CM

## 2019-08-27 DIAGNOSIS — R1030 Lower abdominal pain, unspecified: Secondary | ICD-10-CM | POA: Diagnosis not present

## 2019-08-27 DIAGNOSIS — Z79899 Other long term (current) drug therapy: Secondary | ICD-10-CM | POA: Diagnosis not present

## 2019-08-27 DIAGNOSIS — E039 Hypothyroidism, unspecified: Secondary | ICD-10-CM | POA: Diagnosis present

## 2019-08-27 DIAGNOSIS — F329 Major depressive disorder, single episode, unspecified: Secondary | ICD-10-CM | POA: Diagnosis present

## 2019-08-27 DIAGNOSIS — J45909 Unspecified asthma, uncomplicated: Secondary | ICD-10-CM | POA: Diagnosis not present

## 2019-08-27 DIAGNOSIS — I10 Essential (primary) hypertension: Secondary | ICD-10-CM | POA: Insufficient documentation

## 2019-08-27 DIAGNOSIS — R0602 Shortness of breath: Secondary | ICD-10-CM | POA: Insufficient documentation

## 2019-08-27 DIAGNOSIS — F411 Generalized anxiety disorder: Secondary | ICD-10-CM | POA: Diagnosis not present

## 2019-08-27 DIAGNOSIS — R1012 Left upper quadrant pain: Secondary | ICD-10-CM | POA: Insufficient documentation

## 2019-08-27 DIAGNOSIS — Z818 Family history of other mental and behavioral disorders: Secondary | ICD-10-CM

## 2019-08-27 DIAGNOSIS — Z833 Family history of diabetes mellitus: Secondary | ICD-10-CM

## 2019-08-27 DIAGNOSIS — S27802A Contusion of diaphragm, initial encounter: Secondary | ICD-10-CM | POA: Diagnosis not present

## 2019-08-27 DIAGNOSIS — R109 Unspecified abdominal pain: Secondary | ICD-10-CM | POA: Diagnosis not present

## 2019-08-27 DIAGNOSIS — Z79890 Hormone replacement therapy (postmenopausal): Secondary | ICD-10-CM | POA: Diagnosis not present

## 2019-08-27 DIAGNOSIS — Z91040 Latex allergy status: Secondary | ICD-10-CM | POA: Diagnosis not present

## 2019-08-27 DIAGNOSIS — Y939 Activity, unspecified: Secondary | ICD-10-CM | POA: Diagnosis not present

## 2019-08-27 DIAGNOSIS — Y999 Unspecified external cause status: Secondary | ICD-10-CM | POA: Diagnosis not present

## 2019-08-27 DIAGNOSIS — E785 Hyperlipidemia, unspecified: Secondary | ICD-10-CM | POA: Diagnosis present

## 2019-08-27 DIAGNOSIS — G4733 Obstructive sleep apnea (adult) (pediatric): Secondary | ICD-10-CM | POA: Diagnosis not present

## 2019-08-27 DIAGNOSIS — Z8249 Family history of ischemic heart disease and other diseases of the circulatory system: Secondary | ICD-10-CM

## 2019-08-27 DIAGNOSIS — Z7982 Long term (current) use of aspirin: Secondary | ICD-10-CM

## 2019-08-27 LAB — URINALYSIS, COMPLETE (UACMP) WITH MICROSCOPIC
Bilirubin Urine: NEGATIVE
Glucose, UA: NEGATIVE mg/dL
Ketones, ur: NEGATIVE mg/dL
Nitrite: NEGATIVE
Protein, ur: NEGATIVE mg/dL
Specific Gravity, Urine: 1.026 (ref 1.005–1.030)
pH: 5 (ref 5.0–8.0)

## 2019-08-27 LAB — CBC WITH DIFFERENTIAL/PLATELET
Abs Immature Granulocytes: 0.12 10*3/uL — ABNORMAL HIGH (ref 0.00–0.07)
Basophils Absolute: 0.1 10*3/uL (ref 0.0–0.1)
Basophils Relative: 1 %
Eosinophils Absolute: 0.2 10*3/uL (ref 0.0–0.5)
Eosinophils Relative: 1 %
HCT: 35.9 % — ABNORMAL LOW (ref 36.0–46.0)
Hemoglobin: 12 g/dL (ref 12.0–15.0)
Immature Granulocytes: 1 %
Lymphocytes Relative: 11 %
Lymphs Abs: 2.3 10*3/uL (ref 0.7–4.0)
MCH: 28.2 pg (ref 26.0–34.0)
MCHC: 33.4 g/dL (ref 30.0–36.0)
MCV: 84.3 fL (ref 80.0–100.0)
Monocytes Absolute: 1.6 10*3/uL — ABNORMAL HIGH (ref 0.1–1.0)
Monocytes Relative: 8 %
Neutro Abs: 16.6 10*3/uL — ABNORMAL HIGH (ref 1.7–7.7)
Neutrophils Relative %: 78 %
Platelets: 301 10*3/uL (ref 150–400)
RBC: 4.26 MIL/uL (ref 3.87–5.11)
RDW: 14.2 % (ref 11.5–15.5)
WBC: 20.8 10*3/uL — ABNORMAL HIGH (ref 4.0–10.5)
nRBC: 0 % (ref 0.0–0.2)

## 2019-08-27 LAB — RESPIRATORY PANEL BY RT PCR (FLU A&B, COVID)
Influenza A by PCR: NEGATIVE
Influenza B by PCR: NEGATIVE
SARS Coronavirus 2 by RT PCR: NEGATIVE

## 2019-08-27 LAB — BASIC METABOLIC PANEL
Anion gap: 12 (ref 5–15)
BUN: 11 mg/dL (ref 6–20)
CO2: 28 mmol/L (ref 22–32)
Calcium: 9.1 mg/dL (ref 8.9–10.3)
Chloride: 99 mmol/L (ref 98–111)
Creatinine, Ser: 0.71 mg/dL (ref 0.44–1.00)
GFR calc Af Amer: >60 mL/min (ref >60)
GFR calc non Af Amer: >60 mL/min (ref >60)
Glucose, Bld: 205 mg/dL — ABNORMAL HIGH (ref 70–99)
Potassium: 3.2 mmol/L — ABNORMAL LOW (ref 3.5–5.1)
Sodium: 139 mmol/L (ref 135–145)

## 2019-08-27 MED ORDER — MORPHINE SULFATE (PF) 4 MG/ML IV SOLN
4.0000 mg | Freq: Once | INTRAVENOUS | Status: AC
  Administered 2019-08-27: 4 mg via INTRAVENOUS
  Filled 2019-08-27: qty 1

## 2019-08-27 MED ORDER — ONDANSETRON HCL 4 MG/2ML IJ SOLN
4.0000 mg | Freq: Once | INTRAMUSCULAR | Status: AC
  Administered 2019-08-27: 4 mg via INTRAVENOUS
  Filled 2019-08-27: qty 2

## 2019-08-27 MED ORDER — IOHEXOL 300 MG/ML  SOLN
100.0000 mL | Freq: Once | INTRAMUSCULAR | Status: AC | PRN
  Administered 2019-08-27: 100 mL via INTRAVENOUS

## 2019-08-27 MED ORDER — FENTANYL CITRATE (PF) 100 MCG/2ML IJ SOLN
50.0000 ug | Freq: Once | INTRAMUSCULAR | Status: AC
  Administered 2019-08-27: 50 ug via INTRAVENOUS
  Filled 2019-08-27: qty 2

## 2019-08-27 MED ORDER — SODIUM CHLORIDE 0.9 % IV BOLUS
500.0000 mL | Freq: Once | INTRAVENOUS | Status: AC
  Administered 2019-08-27: 500 mL via INTRAVENOUS

## 2019-08-27 NOTE — Telephone Encounter (Signed)
[employer] insurance just started on 07/30/2019. Its in our system
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
## 2019-08-27 NOTE — ED Notes (Addendum)
Pt resting in bed in NAD with husband at bedside

## 2019-08-27 NOTE — ED Notes (Signed)
Dr. Donne Hazel paged to make aware of pt arrival
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
## 2019-08-27 NOTE — ED Notes (Signed)
Dr. Monks at bedside 

## 2019-08-27 NOTE — Telephone Encounter (Signed)
Called to inform Beverly Barrett her Trulance 3 mg hs been approved. However, pharmacy states she must have swtich insurance coverage's due to her last insurance terminated on December 2020. Called and left a message to ask which pharmacy she is using and to notify her Trulance (for her constipation) is approved.
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
## 2019-08-27 NOTE — ED Notes (Signed)
This RN assisted pt with ambulation to bathroom in room. This RN moved stretcher close to bed and pt was able to pivot from stretcher to toilet. Pt pain increased with movement.

## 2019-08-27 NOTE — ED Notes (Addendum)
This RN spoke with pt daughter, Yuhan Morsey on the phone with permission from pt. Updated pt daughter about patient status, plan of care, and transport to different facility. Pt daughter denies any further questions at this time.

## 2019-08-27 NOTE — ED Notes (Signed)
[facility] arrived to transport pt 
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
## 2019-08-27 NOTE — ED Notes (Signed)
This tech and Taylor, NT assisted pt to bathroom.
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
## 2019-08-27 NOTE — Telephone Encounter (Signed)
lvm to inform pt to call mail order with new insurance

## 2019-08-28 ENCOUNTER — Inpatient Hospital Stay (HOSPITAL_COMMUNITY): Payer: 59

## 2019-08-28 ENCOUNTER — Encounter (HOSPITAL_COMMUNITY): Payer: Self-pay

## 2019-08-28 DIAGNOSIS — Z79890 Hormone replacement therapy (postmenopausal): Secondary | ICD-10-CM | POA: Diagnosis not present

## 2019-08-28 DIAGNOSIS — I10 Essential (primary) hypertension: Secondary | ICD-10-CM | POA: Diagnosis present

## 2019-08-28 DIAGNOSIS — Z6841 Body Mass Index (BMI) 40.0 and over, adult: Secondary | ICD-10-CM | POA: Diagnosis not present

## 2019-08-28 DIAGNOSIS — Z7982 Long term (current) use of aspirin: Secondary | ICD-10-CM | POA: Diagnosis not present

## 2019-08-28 DIAGNOSIS — S27802A Contusion of diaphragm, initial encounter: Secondary | ICD-10-CM | POA: Diagnosis present

## 2019-08-28 DIAGNOSIS — G4733 Obstructive sleep apnea (adult) (pediatric): Secondary | ICD-10-CM | POA: Diagnosis present

## 2019-08-28 DIAGNOSIS — J309 Allergic rhinitis, unspecified: Secondary | ICD-10-CM | POA: Diagnosis present

## 2019-08-28 DIAGNOSIS — K219 Gastro-esophageal reflux disease without esophagitis: Secondary | ICD-10-CM | POA: Diagnosis present

## 2019-08-28 DIAGNOSIS — E039 Hypothyroidism, unspecified: Secondary | ICD-10-CM | POA: Diagnosis present

## 2019-08-28 DIAGNOSIS — Z8249 Family history of ischemic heart disease and other diseases of the circulatory system: Secondary | ICD-10-CM | POA: Diagnosis not present

## 2019-08-28 DIAGNOSIS — F329 Major depressive disorder, single episode, unspecified: Secondary | ICD-10-CM | POA: Diagnosis present

## 2019-08-28 DIAGNOSIS — F411 Generalized anxiety disorder: Secondary | ICD-10-CM | POA: Diagnosis present

## 2019-08-28 DIAGNOSIS — E876 Hypokalemia: Secondary | ICD-10-CM | POA: Diagnosis not present

## 2019-08-28 DIAGNOSIS — Z20822 Contact with and (suspected) exposure to covid-19: Secondary | ICD-10-CM | POA: Diagnosis present

## 2019-08-28 DIAGNOSIS — Z91040 Latex allergy status: Secondary | ICD-10-CM | POA: Diagnosis not present

## 2019-08-28 DIAGNOSIS — Z7984 Long term (current) use of oral hypoglycemic drugs: Secondary | ICD-10-CM | POA: Diagnosis not present

## 2019-08-28 DIAGNOSIS — R109 Unspecified abdominal pain: Secondary | ICD-10-CM | POA: Diagnosis present

## 2019-08-28 DIAGNOSIS — Z833 Family history of diabetes mellitus: Secondary | ICD-10-CM | POA: Diagnosis not present

## 2019-08-28 DIAGNOSIS — Z79899 Other long term (current) drug therapy: Secondary | ICD-10-CM | POA: Diagnosis not present

## 2019-08-28 DIAGNOSIS — E119 Type 2 diabetes mellitus without complications: Secondary | ICD-10-CM | POA: Diagnosis present

## 2019-08-28 DIAGNOSIS — Z9981 Dependence on supplemental oxygen: Secondary | ICD-10-CM | POA: Diagnosis not present

## 2019-08-28 DIAGNOSIS — E785 Hyperlipidemia, unspecified: Secondary | ICD-10-CM | POA: Diagnosis present

## 2019-08-28 DIAGNOSIS — Y92410 Unspecified street and highway as the place of occurrence of the external cause: Secondary | ICD-10-CM | POA: Diagnosis not present

## 2019-08-28 DIAGNOSIS — Z818 Family history of other mental and behavioral disorders: Secondary | ICD-10-CM | POA: Diagnosis not present

## 2019-08-28 LAB — BASIC METABOLIC PANEL
Anion gap: 12 (ref 5–15)
BUN: 10 mg/dL (ref 6–20)
CO2: 29 mmol/L (ref 22–32)
Calcium: 8.5 mg/dL — ABNORMAL LOW (ref 8.9–10.3)
Chloride: 99 mmol/L (ref 98–111)
Creatinine, Ser: 0.85 mg/dL (ref 0.44–1.00)
GFR calc Af Amer: >60 mL/min (ref >60)
GFR calc non Af Amer: >60 mL/min (ref >60)
Glucose, Bld: 162 mg/dL — ABNORMAL HIGH (ref 70–99)
Potassium: 3.6 mmol/L (ref 3.5–5.1)
Sodium: 140 mmol/L (ref 135–145)

## 2019-08-28 LAB — CBC
HCT: 32.3 % — ABNORMAL LOW (ref 36.0–46.0)
Hemoglobin: 10.8 g/dL — ABNORMAL LOW (ref 12.0–15.0)
MCH: 28.6 pg (ref 26.0–34.0)
MCHC: 33.4 g/dL (ref 30.0–36.0)
MCV: 85.4 fL (ref 80.0–100.0)
Platelets: 329 10*3/uL (ref 150–400)
RBC: 3.78 MIL/uL — ABNORMAL LOW (ref 3.87–5.11)
RDW: 14.4 % (ref 11.5–15.5)
WBC: 10.1 10*3/uL (ref 4.0–10.5)
nRBC: 0 % (ref 0.0–0.2)

## 2019-08-28 LAB — HEMOGLOBIN A1C
Hgb A1c MFr Bld: 7.3 % — ABNORMAL HIGH (ref 4.8–5.6)
Mean Plasma Glucose: 162.81 mg/dL

## 2019-08-28 LAB — GLUCOSE, CAPILLARY
Glucose-Capillary: 169 mg/dL — ABNORMAL HIGH (ref 70–99)
Glucose-Capillary: 174 mg/dL — ABNORMAL HIGH (ref 70–99)
Glucose-Capillary: 208 mg/dL — ABNORMAL HIGH (ref 70–99)
Glucose-Capillary: 143 mg/dL — ABNORMAL HIGH (ref 70–99)
Glucose-Capillary: 142 mg/dL — ABNORMAL HIGH (ref 70–99)

## 2019-08-28 MED ORDER — SODIUM CHLORIDE 0.9 % IV BOLUS
1000.0000 mL | Freq: Once | INTRAVENOUS | Status: AC
  Administered 2019-08-28: 1000 mL via INTRAVENOUS

## 2019-08-28 MED ORDER — HYDROCHLOROTHIAZIDE 25 MG PO TABS
25.0000 mg | ORAL_TABLET | Freq: Every day | ORAL | Status: DC
  Administered 2019-08-28: 10:00:00 25 mg via ORAL
  Filled 2019-08-28 (×2): qty 1

## 2019-08-28 MED ORDER — SODIUM CHLORIDE 0.9 % IV SOLN: INTRAVENOUS | Status: DC

## 2019-08-28 MED ORDER — BUDESONIDE 0.25 MG/2ML IN SUSP
0.2500 mg | Freq: Two times a day (BID) | RESPIRATORY_TRACT | Status: DC
  Administered 2019-08-28 – 2019-08-30 (×4): 0.25 mg via RESPIRATORY_TRACT
  Filled 2019-08-28 (×6): qty 2

## 2019-08-28 MED ORDER — ALBUTEROL SULFATE (2.5 MG/3ML) 0.083% IN NEBU
2.5000 mg | INHALATION_SOLUTION | Freq: Four times a day (QID) | RESPIRATORY_TRACT | Status: DC | PRN
  Administered 2019-08-28 – 2019-08-30 (×2): 2.5 mg via RESPIRATORY_TRACT
  Filled 2019-08-28 (×2): qty 3

## 2019-08-28 MED ORDER — FLUTICASONE PROPIONATE 50 MCG/ACT NA SUSP
2.0000 | Freq: Every day | NASAL | Status: DC
  Administered 2019-08-30: 2 via NASAL
  Filled 2019-08-28: qty 16

## 2019-08-28 MED ORDER — INSULIN ASPART 100 UNIT/ML ~~LOC~~ SOLN
0.0000 [IU] | Freq: Three times a day (TID) | SUBCUTANEOUS | Status: DC
  Administered 2019-08-28: 17:00:00 2 [IU] via SUBCUTANEOUS
  Administered 2019-08-28: 10:00:00 3 [IU] via SUBCUTANEOUS
  Administered 2019-08-28: 12:00:00 5 [IU] via SUBCUTANEOUS
  Administered 2019-08-29: 2 [IU] via SUBCUTANEOUS
  Administered 2019-08-29 – 2019-08-30 (×3): 3 [IU] via SUBCUTANEOUS

## 2019-08-28 MED ORDER — OLMESARTAN MEDOXOMIL-HCTZ 40-25 MG PO TABS: 1.0000 | ORAL_TABLET | Freq: Every day | ORAL | Status: DC

## 2019-08-28 MED ORDER — PANTOPRAZOLE SODIUM 40 MG PO TBEC
40.0000 mg | DELAYED_RELEASE_TABLET | Freq: Every day | ORAL | Status: DC
  Administered 2019-08-28 – 2019-08-30 (×3): 40 mg via ORAL
  Filled 2019-08-28 (×3): qty 1

## 2019-08-28 MED ORDER — OXYCODONE HCL 5 MG PO TABS
5.0000 mg | ORAL_TABLET | ORAL | Status: DC | PRN
  Administered 2019-08-29 (×2): 5 mg via ORAL
  Filled 2019-08-28 (×2): qty 1

## 2019-08-28 MED ORDER — SUMATRIPTAN SUCCINATE 50 MG PO TABS: 50.0000 mg | ORAL_TABLET | ORAL | Status: DC | PRN

## 2019-08-28 MED ORDER — MORPHINE SULFATE (PF) 2 MG/ML IV SOLN
1.0000 mg | INTRAVENOUS | Status: DC | PRN
  Administered 2019-08-28: 1 mg via INTRAVENOUS
  Filled 2019-08-28: qty 1

## 2019-08-28 MED ORDER — ONDANSETRON 4 MG PO TBDP
4.0000 mg | ORAL_TABLET | Freq: Four times a day (QID) | ORAL | Status: DC | PRN
  Administered 2019-08-29: 20:00:00 4 mg via ORAL
  Filled 2019-08-28: qty 1

## 2019-08-28 MED ORDER — GABAPENTIN 400 MG PO CAPS
400.0000 mg | ORAL_CAPSULE | Freq: Two times a day (BID) | ORAL | Status: DC
  Administered 2019-08-28 – 2019-08-30 (×4): 400 mg via ORAL
  Filled 2019-08-28 (×4): qty 1

## 2019-08-28 MED ORDER — CYCLOBENZAPRINE HCL 5 MG PO TABS
5.0000 mg | ORAL_TABLET | Freq: Three times a day (TID) | ORAL | Status: DC | PRN
  Administered 2019-08-28: 01:00:00 5 mg via ORAL
  Filled 2019-08-28: qty 1

## 2019-08-28 MED ORDER — DULOXETINE HCL 60 MG PO CPEP
60.0000 mg | ORAL_CAPSULE | Freq: Two times a day (BID) | ORAL | Status: DC
  Administered 2019-08-28 – 2019-08-30 (×6): 60 mg via ORAL
  Filled 2019-08-28 (×7): qty 1

## 2019-08-28 MED ORDER — GABAPENTIN 100 MG PO CAPS: 100.0000 mg | ORAL_CAPSULE | Freq: Three times a day (TID) | ORAL | Status: DC

## 2019-08-28 MED ORDER — AMLODIPINE BESYLATE 2.5 MG PO TABS
2.5000 mg | ORAL_TABLET | Freq: Every day | ORAL | Status: DC
  Administered 2019-08-28: 10:00:00 2.5 mg via ORAL
  Filled 2019-08-28 (×2): qty 1

## 2019-08-28 MED ORDER — ACETAMINOPHEN 500 MG PO TABS
1000.0000 mg | ORAL_TABLET | Freq: Four times a day (QID) | ORAL | Status: DC
  Administered 2019-08-28 – 2019-08-30 (×8): 1000 mg via ORAL
  Filled 2019-08-28 (×8): qty 2

## 2019-08-28 MED ORDER — IRBESARTAN 300 MG PO TABS
300.0000 mg | ORAL_TABLET | Freq: Every day | ORAL | Status: DC
  Administered 2019-08-28 – 2019-08-30 (×3): 300 mg via ORAL
  Filled 2019-08-28 (×3): qty 1

## 2019-08-28 MED ORDER — MONTELUKAST SODIUM 10 MG PO TABS
10.0000 mg | ORAL_TABLET | Freq: Every day | ORAL | Status: DC
  Administered 2019-08-28 – 2019-08-29 (×3): 10 mg via ORAL
  Filled 2019-08-28 (×4): qty 1

## 2019-08-28 MED ORDER — ONDANSETRON HCL 4 MG/2ML IJ SOLN
4.0000 mg | Freq: Four times a day (QID) | INTRAMUSCULAR | Status: DC | PRN
  Administered 2019-08-28: 04:00:00 4 mg via INTRAVENOUS
  Filled 2019-08-28: qty 2

## 2019-08-28 MED ORDER — INSULIN ASPART 100 UNIT/ML ~~LOC~~ SOLN
0.0000 [IU] | Freq: Every day | SUBCUTANEOUS | Status: DC
  Administered 2019-08-29: 2 [IU] via SUBCUTANEOUS

## 2019-08-28 MED ORDER — METOPROLOL TARTRATE 5 MG/5ML IV SOLN: 5.0000 mg | Freq: Four times a day (QID) | INTRAVENOUS | Status: DC | PRN

## 2019-08-28 MED ORDER — POTASSIUM CHLORIDE CRYS ER 20 MEQ PO TBCR
40.0000 meq | EXTENDED_RELEASE_TABLET | Freq: Once | ORAL | Status: AC
  Administered 2019-08-28: 40 meq via ORAL
  Filled 2019-08-28: qty 2

## 2019-08-28 MED ORDER — LEVOTHYROXINE SODIUM 112 MCG PO TABS
112.0000 ug | ORAL_TABLET | Freq: Every day | ORAL | Status: DC
  Administered 2019-08-28 – 2019-08-30 (×3): 112 ug via ORAL
  Filled 2019-08-28 (×3): qty 1

## 2019-08-28 MED ORDER — GABAPENTIN 400 MG PO CAPS
800.0000 mg | ORAL_CAPSULE | Freq: Every day | ORAL | Status: DC
  Administered 2019-08-28 – 2019-08-29 (×3): 800 mg via ORAL
  Filled 2019-08-28 (×3): qty 2

## 2019-08-28 MED ORDER — ENOXAPARIN SODIUM 40 MG/0.4ML ~~LOC~~ SOLN
40.0000 mg | Freq: Every day | SUBCUTANEOUS | Status: DC
  Administered 2019-08-29 – 2019-08-30 (×2): 40 mg via SUBCUTANEOUS
  Filled 2019-08-28 (×2): qty 0.4

## 2019-08-28 MED ORDER — CLONIDINE HCL 0.1 MG PO TABS
0.1000 mg | ORAL_TABLET | Freq: Two times a day (BID) | ORAL | Status: DC
  Administered 2019-08-28 (×2): 0.1 mg via ORAL
  Filled 2019-08-28 (×5): qty 1

## 2019-08-28 MED ORDER — METOPROLOL SUCCINATE ER 25 MG PO TB24
50.0000 mg | ORAL_TABLET | Freq: Every day | ORAL | Status: DC
  Administered 2019-08-28 – 2019-08-30 (×3): 50 mg via ORAL
  Filled 2019-08-28 (×3): qty 2

## 2019-08-28 MED ORDER — QUETIAPINE FUMARATE 50 MG PO TABS
50.0000 mg | ORAL_TABLET | Freq: Every day | ORAL | Status: DC
  Administered 2019-08-28 – 2019-08-29 (×3): 50 mg via ORAL
  Filled 2019-08-28 (×4): qty 1

## 2019-08-28 NOTE — Progress Notes (Signed)
Received patient from ED, AOx4, ambulated with assistance during transfer, VSS, pain at 8/10, oriented to room, bed control, call light and plan of care.  Administered scheduled tylenol 1G per order, called RRT for CPAP requirement and updated pt's daughter, Dalana Dold on pt's condition, plan of care and both patient and daughter were able to talk via phone.  Pt now on CPAP and resting on bed.  Will monitor.

## 2019-08-28 NOTE — ED Notes (Signed)
DR WAKEFIELD CALLED AT [phone] HE WILL SEE

## 2019-08-28 NOTE — ED Notes (Signed)
REPORT GIVEN TO RN ON 6N.
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
## 2019-08-28 NOTE — ED Notes (Signed)
Beverly Barrett daughter [id number] looking for an update on the patient

## 2019-08-28 NOTE — Plan of Care (Addendum)
Pt resting in bed, x1 assist to bathroom. Right leg still tender. Pt went for xray this am. DC fluids since PO intake was good. Plan of care reviewed with pt and family member over phone. Pt stable at this time, all needs met, will continue to monitor. CPAP on when sleeping.  1516: Paged MD [place] Silos " [place] 6[address] rm24: FYI BP 81/56 (65) comfortable in chair. Denies pain and dizziness. Drinking fluid."    1721: pt resting in bed. 1L fluid bolus almost complete. Pt states, " I feel good."  Problem: Education: Goal: Knowledge of General Education information will improve Description: Including pain rating scale, medication(s)/side effects and non-pharmacologic comfort measures Outcome: Progressing   Problem: Health Behavior/Discharge Planning: Goal: Ability to manage health-related needs will improve Outcome: Progressing   Problem: Clinical Measurements: Goal: Ability to maintain clinical measurements within normal limits will improve Outcome: Progressing Goal: Will remain free from infection Outcome: Progressing Goal: Diagnostic test results will improve Outcome: Progressing Goal: Respiratory complications will improve Outcome: Progressing Goal: Cardiovascular complication will be avoided Outcome: Progressing   Problem: Activity: Goal: Risk for activity intolerance will decrease Outcome: Progressing   Problem: Nutrition: Goal: Adequate nutrition will be maintained Outcome: Progressing   Problem: Coping: Goal: Level of anxiety will decrease Outcome: Progressing   Problem: Elimination: Goal: Will not experience complications related to bowel motility Outcome: Progressing Goal: Will not experience complications related to urinary retention Outcome: Progressing   Problem: Pain Managment: Goal: General experience of comfort will improve Outcome: Progressing   Problem: Safety: Goal: Ability to remain free from injury will improve Outcome: Progressing   Problem: Skin  Integrity: Goal: Risk for impaired skin integrity will decrease Outcome: Progressing

## 2019-08-28 NOTE — Progress Notes (Signed)
Placed patient on CPAP for the night via auto-mode with oxygen set at 2lpm  

## 2019-08-29 ENCOUNTER — Inpatient Hospital Stay (HOSPITAL_COMMUNITY): Payer: 59

## 2019-08-29 LAB — GLUCOSE, CAPILLARY
Glucose-Capillary: 141 mg/dL — ABNORMAL HIGH (ref 70–99)
Glucose-Capillary: 169 mg/dL — ABNORMAL HIGH (ref 70–99)
Glucose-Capillary: 152 mg/dL — ABNORMAL HIGH (ref 70–99)
Glucose-Capillary: 209 mg/dL — ABNORMAL HIGH (ref 70–99)
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
## 2019-08-29 NOTE — Progress Notes (Signed)
Pt. Refused cpap for tonight. Pt. Currently feeling nauseous. Pt. Will notify if she changes her mind.

## 2019-08-30 LAB — BASIC METABOLIC PANEL
Anion gap: 14 (ref 5–15)
BUN: 22 mg/dL — ABNORMAL HIGH (ref 6–20)
CO2: 28 mmol/L (ref 22–32)
Calcium: 8.8 mg/dL — ABNORMAL LOW (ref 8.9–10.3)
Chloride: 99 mmol/L (ref 98–111)
Creatinine, Ser: 0.68 mg/dL (ref 0.44–1.00)
GFR calc Af Amer: >60 mL/min (ref >60)
GFR calc non Af Amer: >60 mL/min (ref >60)
Glucose, Bld: 133 mg/dL — ABNORMAL HIGH (ref 70–99)
Potassium: 3.6 mmol/L (ref 3.5–5.1)
Sodium: 141 mmol/L (ref 135–145)

## 2019-08-30 LAB — PHOSPHORUS: Phosphorus: 4.1 mg/dL (ref 2.5–4.6)

## 2019-08-30 LAB — CBC
HCT: 30.6 % — ABNORMAL LOW (ref 36.0–46.0)
Hemoglobin: 10 g/dL — ABNORMAL LOW (ref 12.0–15.0)
MCH: 28.9 pg (ref 26.0–34.0)
MCHC: 32.7 g/dL (ref 30.0–36.0)
MCV: 88.4 fL (ref 80.0–100.0)
Platelets: 305 10*3/uL (ref 150–400)
RBC: 3.46 MIL/uL — ABNORMAL LOW (ref 3.87–5.11)
RDW: 14.7 % (ref 11.5–15.5)
WBC: 10.3 10*3/uL (ref 4.0–10.5)
nRBC: 0 % (ref 0.0–0.2)

## 2019-08-30 LAB — GLUCOSE, CAPILLARY
Glucose-Capillary: 120 mg/dL — ABNORMAL HIGH (ref 70–99)
Glucose-Capillary: 158 mg/dL — ABNORMAL HIGH (ref 70–99)

## 2019-08-30 LAB — MAGNESIUM: Magnesium: 2 mg/dL (ref 1.7–2.4)

## 2019-08-30 MED ORDER — OXYCODONE HCL 5 MG PO TABS: 5.0000 mg | ORAL_TABLET | Freq: Four times a day (QID) | ORAL | 0 refills | Status: DC | PRN

## 2019-08-30 MED ORDER — ACETAMINOPHEN 500 MG PO TABS: 1000.0000 mg | ORAL_TABLET | Freq: Four times a day (QID) | ORAL | 0 refills | Status: DC | PRN

## 2019-08-30 MED ORDER — POLYETHYLENE GLYCOL 3350 17 G PO PACK: 17.0000 g | PACK | Freq: Every day | ORAL | 0 refills | Status: DC | PRN
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
## 2019-08-30 NOTE — Progress Notes (Addendum)
Patient at rest in chair O2 98-100% connected to dynamap, while ambulating in the room patient remained >90% on room air. Brooke , [place] notified.

## 2019-08-30 NOTE — Progress Notes (Signed)
Patient discharged to home. Verbalizes understanding of all discharge instructions including discharge medications and follow up MD visits. Patient's daughter at bedside for review of discharge teaching.

## 2019-08-31 ENCOUNTER — Telehealth: Payer: Self-pay
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
## 2019-09-01 NOTE — Telephone Encounter (Signed)
Spoke with patient daughter, she was notified from the hospital notes she doesn't qualify for oxygen since her room air O2 was 98%. The daughter states they bought a pulse oximeter and they are actively keeping a log of her oxygen saturations while being at home and will bring them in during her appointment on 09/14/2019 to discuss them with Dr. Ancil Boozer

## 2019-09-14 ENCOUNTER — Ambulatory Visit: Payer: 59 | Admitting: Family Medicine

## 2019-09-14 ENCOUNTER — Other Ambulatory Visit: Payer: Self-pay

## 2019-09-14 ENCOUNTER — Encounter: Payer: Self-pay | Admitting: Family Medicine

## 2019-09-14 DIAGNOSIS — R0602 Shortness of breath: Secondary | ICD-10-CM | POA: Diagnosis not present

## 2019-09-14 DIAGNOSIS — S27809D Unspecified injury of diaphragm, subsequent encounter: Secondary | ICD-10-CM | POA: Diagnosis not present

## 2019-09-14 DIAGNOSIS — S8011XD Contusion of right lower leg, subsequent encounter: Secondary | ICD-10-CM

## 2019-09-14 DIAGNOSIS — S301XXD Contusion of abdominal wall, subsequent encounter: Secondary | ICD-10-CM
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
## 2019-09-15 ENCOUNTER — Other Ambulatory Visit: Payer: Self-pay | Admitting: Family Medicine

## 2019-09-15 DIAGNOSIS — J4520 Mild intermittent asthma, uncomplicated: Secondary | ICD-10-CM

## 2019-09-15 DIAGNOSIS — E1169 Type 2 diabetes mellitus with other specified complication: Secondary | ICD-10-CM

## 2019-09-15 DIAGNOSIS — E785 Hyperlipidemia, unspecified: Secondary | ICD-10-CM

## 2019-09-15 DIAGNOSIS — K5909 Other constipation: Secondary | ICD-10-CM

## 2019-09-15 LAB — COMPLETE METABOLIC PANEL WITH GFR
AG Ratio: 1.4 (calc) (ref 1.0–2.5)
ALT: 11 U/L (ref 6–29)
AST: 15 U/L (ref 10–35)
Albumin: 4 g/dL (ref 3.6–5.1)
Alkaline phosphatase (APISO): 64 U/L (ref 37–153)
BUN: 14 mg/dL (ref 7–25)
CO2: 25 mmol/L (ref 20–32)
Calcium: 9.3 mg/dL (ref 8.6–10.4)
Chloride: 105 mmol/L (ref 98–110)
Creat: 0.64 mg/dL (ref 0.50–0.99)
GFR, Est African American: 112 mL/min/{1.73_m2} (ref >=60)
GFR, Est Non African American: 97 mL/min/{1.73_m2} (ref >=60)
Globulin: 2.8 g/dL (calc) (ref 1.9–3.7)
Glucose, Bld: 114 mg/dL — ABNORMAL HIGH (ref 65–99)
Potassium: 4.1 mmol/L (ref 3.5–5.3)
Sodium: 142 mmol/L (ref 135–146)
Total Bilirubin: 0.7 mg/dL (ref 0.2–1.2)
Total Protein: 6.8 g/dL (ref 6.1–8.1)

## 2019-09-15 LAB — CBC WITH DIFFERENTIAL/PLATELET
Absolute Monocytes: 796 cells/uL (ref 200–950)
Basophils Absolute: 55 cells/uL (ref 0–200)
Basophils Relative: 0.5 %
Eosinophils Absolute: 294 cells/uL (ref 15–500)
Eosinophils Relative: 2.7 %
HCT: 31.9 % — ABNORMAL LOW (ref 35.0–45.0)
Hemoglobin: 10.7 g/dL — ABNORMAL LOW (ref 11.7–15.5)
Lymphs Abs: 2180 cells/uL (ref 850–3900)
MCH: 28.5 pg (ref 27.0–33.0)
MCHC: 33.5 g/dL (ref 32.0–36.0)
MCV: 85.1 fL (ref 80.0–100.0)
MPV: 9.5 fL (ref 7.5–12.5)
Monocytes Relative: 7.3 %
Neutro Abs: 7576 cells/uL (ref 1500–7800)
Neutrophils Relative %: 69.5 %
Platelets: 357 10*3/uL (ref 140–400)
RBC: 3.75 10*6/uL — ABNORMAL LOW (ref 3.80–5.10)
RDW: 14.7 % (ref 11.0–15.0)
Total Lymphocyte: 20 %
WBC: 10.9 10*3/uL — ABNORMAL HIGH (ref 3.8–10.8)

## 2019-09-15 LAB — LIPID PANEL
Cholesterol: 151 mg/dL (ref <200)
HDL: 33 mg/dL — ABNORMAL LOW (ref >=50)
LDL Cholesterol (Calc): 87 mg/dL (calc)
Non-HDL Cholesterol (Calc): 118 mg/dL (calc) (ref <130)
Total CHOL/HDL Ratio: 4.6 (calc) (ref <5.0)
Triglycerides: 221 mg/dL — ABNORMAL HIGH (ref <150)

## 2019-09-15 LAB — IRON,TIBC AND FERRITIN PANEL
%SAT: 15 % (calc) — ABNORMAL LOW (ref 16–45)
Ferritin: 150 ng/mL (ref 16–232)
Iron: 55 ug/dL (ref 45–160)
TIBC: 371 mcg/dL (calc) (ref 250–450)

## 2019-09-15 LAB — TSH: TSH: 5.64 mIU/L — ABNORMAL HIGH (ref 0.40–4.50)

## 2019-09-15 NOTE — Telephone Encounter (Signed)
Requested medication (s) are due for refill today: truliance, yes  Requested medication (s) are on the active medication list: yes  Last refill: 11/12/2018  Future visit scheduled: yes   Notes to clinic: no assigned protocol  Requested medication (s) are due for refill today: Asmanex, yes  Requested medication (s) are on the active medication list:no  Last refill: 10/13/18  Future visit scheduled: yes  Notes to clinic:  Not on active med list  Requested medication (s) are due for refill today: microlet lancets, yes  Requested medication (s) are on the active medication list: yes  Last refill: 07/20/2019  Future visit scheduled: yes  Notes to clinic: historical provider
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
## 2019-09-17 ENCOUNTER — Other Ambulatory Visit: Payer: Self-pay | Admitting: Family Medicine

## 2019-09-17 ENCOUNTER — Other Ambulatory Visit: Payer: Self-pay | Admitting: Psychiatry

## 2019-09-17 DIAGNOSIS — E785 Hyperlipidemia, unspecified: Secondary | ICD-10-CM

## 2019-09-17 DIAGNOSIS — E1159 Type 2 diabetes mellitus with other circulatory complications: Secondary | ICD-10-CM

## 2019-09-17 DIAGNOSIS — J4520 Mild intermittent asthma, uncomplicated: Secondary | ICD-10-CM

## 2019-09-17 DIAGNOSIS — R232 Flushing: Secondary | ICD-10-CM

## 2019-09-17 DIAGNOSIS — F3341 Major depressive disorder, recurrent, in partial remission: Secondary | ICD-10-CM
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
## 2019-09-20 ENCOUNTER — Other Ambulatory Visit: Payer: Self-pay

## 2019-09-20 MED ORDER — ONETOUCH DELICA LANCING DEV MISC: 1.0000 | Freq: Every day | 0 refills | Status: AC

## 2019-09-20 MED ORDER — ONETOUCH DELICA LANCETS 33G MISC: 1.0000 | Freq: Every day | 2 refills | Status: DC

## 2019-09-20 NOTE — Telephone Encounter (Signed)
Insurance does not Scientist, product/process development. They prefer OneTouch Delica Lancets [id number], OneTouch Delica Lancets [id number], OneTouch Delica plus Lancet [id number], Onetouch Ultrasoft Lancets.

## 2019-10-02 ENCOUNTER — Other Ambulatory Visit: Payer: Self-pay | Admitting: Family Medicine

## 2019-10-02 DIAGNOSIS — K219 Gastro-esophageal reflux disease without esophagitis: Secondary | ICD-10-CM
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
## 2019-10-08 ENCOUNTER — Telehealth: Payer: Self-pay | Admitting: Family Medicine

## 2019-10-08 NOTE — Telephone Encounter (Signed)
Patient notified PA approved for [employer] PA code: [id number] Dates: 10/08/2019-10/07/2020

## 2019-10-15 ENCOUNTER — Other Ambulatory Visit: Payer: Self-pay | Admitting: Family Medicine

## 2019-10-15 DIAGNOSIS — I152 Hypertension secondary to endocrine disorders: Secondary | ICD-10-CM

## 2019-10-15 DIAGNOSIS — E1159 Type 2 diabetes mellitus with other circulatory complications: Secondary | ICD-10-CM
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
## 2019-11-15 ENCOUNTER — Telehealth: Payer: Self-pay

## 2019-11-15 NOTE — Telephone Encounter (Signed)
prior auth submitted and approved for duloxetine hcl 300mg 

## 2019-11-22 ENCOUNTER — Encounter: Payer: Self-pay | Admitting: Family Medicine

## 2019-11-22 ENCOUNTER — Other Ambulatory Visit: Payer: Self-pay

## 2019-11-22 ENCOUNTER — Ambulatory Visit (INDEPENDENT_AMBULATORY_CARE_PROVIDER_SITE_OTHER): Payer: 59 | Admitting: Family Medicine

## 2019-11-22 VITALS — BP 124/78 | HR 83 | Temp 97.1°F | Resp 16 | Ht 62.0 in | Wt 264.3 lb

## 2019-11-22 DIAGNOSIS — E1159 Type 2 diabetes mellitus with other circulatory complications: Secondary | ICD-10-CM | POA: Diagnosis not present

## 2019-11-22 DIAGNOSIS — G4733 Obstructive sleep apnea (adult) (pediatric): Secondary | ICD-10-CM

## 2019-11-22 DIAGNOSIS — Z6841 Body Mass Index (BMI) 40.0 and over, adult: Secondary | ICD-10-CM

## 2019-11-22 DIAGNOSIS — Z9989 Dependence on other enabling machines and devices: Secondary | ICD-10-CM

## 2019-11-22 DIAGNOSIS — I10 Essential (primary) hypertension: Secondary | ICD-10-CM

## 2019-11-22 DIAGNOSIS — E039 Hypothyroidism, unspecified: Secondary | ICD-10-CM

## 2019-11-22 DIAGNOSIS — R232 Flushing: Secondary | ICD-10-CM | POA: Diagnosis not present

## 2019-11-22 DIAGNOSIS — E785 Hyperlipidemia, unspecified: Secondary | ICD-10-CM | POA: Diagnosis not present

## 2019-11-22 DIAGNOSIS — K5909 Other constipation: Secondary | ICD-10-CM

## 2019-11-22 DIAGNOSIS — J4540 Moderate persistent asthma, uncomplicated: Secondary | ICD-10-CM | POA: Diagnosis not present

## 2019-11-22 DIAGNOSIS — E1169 Type 2 diabetes mellitus with other specified complication: Secondary | ICD-10-CM

## 2019-11-22 DIAGNOSIS — F331 Major depressive disorder, recurrent, moderate: Secondary | ICD-10-CM

## 2019-11-22 DIAGNOSIS — G43009 Migraine without aura, not intractable, without status migrainosus: Secondary | ICD-10-CM

## 2019-11-22 LAB — POCT GLYCOSYLATED HEMOGLOBIN (HGB A1C): HbA1c, POC (controlled diabetic range): 7.6 % — AB (ref 0.0–7.0)

## 2019-11-22 MED ORDER — LUBIPROSTONE 24 MCG PO CAPS: 24.0000 ug | ORAL_CAPSULE | Freq: Two times a day (BID) | ORAL | 1 refills | Status: DC

## 2019-11-22 MED ORDER — METFORMIN HCL ER 750 MG PO TB24: 1500.0000 mg | ORAL_TABLET | Freq: Every day | ORAL | 1 refills | Status: DC

## 2019-11-22 MED ORDER — CLONIDINE HCL 0.1 MG PO TABS: 0.1000 mg | ORAL_TABLET | Freq: Two times a day (BID) | ORAL | 1 refills | Status: DC

## 2019-11-22 MED ORDER — OLMESARTAN MEDOXOMIL-HCTZ 40-25 MG PO TABS: 1.0000 | ORAL_TABLET | Freq: Every day | ORAL | 1 refills | Status: DC

## 2019-11-22 MED ORDER — AMLODIPINE BESYLATE 2.5 MG PO TABS: 2.5000 mg | ORAL_TABLET | Freq: Every day | ORAL | 1 refills | Status: DC

## 2019-11-22 MED ORDER — ASMANEX (60 METERED DOSES) 220 MCG/INH IN AEPB: 2.0000 | INHALATION_SPRAY | Freq: Every day | RESPIRATORY_TRACT | 1 refills | Status: DC

## 2019-11-22 NOTE — Progress Notes (Signed)
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 mg/dL for primary prevention;   <70 mg/dL for patients with CHD or diabetic patients  with > or = 2 CHD risk factors. Marland Kitchen LDL-C is now calculated using the Crudup-Hopkins  calculation, which is a validated novel method providing  better accuracy than the Friedewald equation in the  estimation of LDL-C.  Cresenciano Genre et al. Annamaria Helling. [id number]): 2061-2068  (http://education.[facility].com/faq/FAQ164)    Total  CHOL/HDL Ratio 4.6 <5.0 (calc)   Non-HDL Cholesterol (Calc) 118 <130 mg/dL (calc)    Comment: For patients with diabetes plus 1 major ASCVD risk  factor, treating to a  non-HDL-C goal of <100 mg/dL  (LDL-C of <70 mg/dL) is considered a therapeutic  option.   TSH     Status: Abnormal   Collection Time: 09/14/19 12:04 PM  Result Value Ref Range   TSH 5.64 (H) 0.40 - 4.50 mIU/L  CBC with Differential/Platelet     Status: Abnormal   Collection Time: 09/14/19 12:04 PM  Result Value Ref Range   WBC 10.9 (H) 3.8 - 10.8 Thousand/uL   RBC 3.75 (L) 3.80 - 5.10 Million/uL   Hemoglobin 10.7 (L) 11.7 - 15.5 g/dL   HCT 31.9 (L) 35.0 - 45.0 %   MCV 85.1 80.0 - 100.0 fL   MCH 28.5 27.0 - 33.0 pg   MCHC 33.5 32.0 - 36.0 g/dL   RDW 14.7 11.0 - 15.0 %   Platelets 357 140 - 400 Thousand/uL   MPV 9.5 7.5 - 12.5 fL   Neutro Abs 7,576 1,500 - 7,800 cells/uL   Lymphs Abs 2,180 850 - 3,900 cells/uL   Absolute Monocytes 796 200 - 950 cells/uL   Eosinophils Absolute 294 15 - 500 cells/uL   Basophils Absolute 55 0 - 200 cells/uL   Neutrophils Relative % 69.5 %   Total Lymphocyte 20.0 %   Monocytes Relative 7.3 %   Eosinophils Relative 2.7 %   Basophils Relative 0.5 %  Iron, TIBC and Ferritin Panel     Status: Abnormal   Collection Time: 09/14/19 12:04 PM  Result Value Ref Range   Iron 55 45 - 160 mcg/dL   TIBC 371 250 - 450 mcg/dL (calc)   %SAT 15 (L) 16 - 45 % (calc)   Ferritin 150 16 - 232 ng/mL  POCT HgB A1C     Status: Abnormal   Collection Time: 11/22/19 10:14 AM  Result Value Ref Range   Hemoglobin A1C     HbA1c POC (<> result, manual entry)     HbA1c, POC (prediabetic range)     HbA1c, POC (controlled diabetic range) 7.6 (A) 0.0 - 7.0 %     PHQ2/9: Depression screen [facility] 2/9 11/22/2019 09/14/2019 08/23/2019 07/14/2019 04/14/2019  Decreased Interest 1 3 2 3  0  Down, Depressed, Hopeless 1 3 2 1 1   PHQ - 2 Score 2 6 4 4 1   Altered sleeping 2 0 1 0 1  Tired, decreased energy 1 1 2 1 1   Change in appetite 2 1 1  0 1  Feeling bad or failure about yourself  1 3 2 1 3   Trouble concentrating 1 1 3 1  0  Moving slowly or fidgety/restless 2 1 0 0 0  Suicidal  thoughts 2 0 0 0 1  PHQ-9 Score 13 13 13 7 8   Difficult doing work/chores Very difficult Somewhat difficult Somewhat difficult Extremely dIfficult Very difficult  Some recent data might be hidden    phq 9 is positive   Fall Risk: Fall Risk  11/22/2019 09/14/2019 08/23/2019 04/14/2019 12/02/2018  Falls in the past year? 0 0 1 1 1   Number falls in past yr: 0 0 0 1 0  Injury with Fall? 0 0 0 0 1  Comment - - - - Knee Pain  Follow up - Falls evaluation completed - - -     Functional Status Survey: Is the patient deaf or have difficulty hearing?: No Does  the patient have difficulty seeing, even when wearing glasses/contacts?: No Does the patient have difficulty concentrating, remembering, or making decisions?: No Does the patient have difficulty walking or climbing stairs?: No Does the patient have difficulty dressing or bathing?: No Does the patient have difficulty doing errands alone such as visiting a doctor's office or shopping?: No    Assessment & Plan  1. Dyslipidemia associated with type 2 diabetes mellitus (HCC)  - POCT HgB A1C - metFORMIN (GLUCOPHAGE-XR) 750 MG 24 hr tablet; Take 2 tablets (1,500 mg total) by mouth daily with breakfast.  Dispense: 180 tablet; Refill: 1  2. Hypertension associated with type 2 diabetes mellitus (HCC)  - olmesartan-hydrochlorothiazide (BENICAR HCT) 40-25 MG tablet; Take 1 tablet by mouth daily.  Dispense: 90 tablet; Refill: 1  3. Hot flashes  - cloNIDine (CATAPRES) 0.1 MG tablet; Take 1 tablet (0.1 mg total) by mouth 2 (two) times daily.  Dispense: 180 tablet; Refill: 1  4. Asthma, moderate persistent, well-controlled  - mometasone (ASMANEX, 60 METERED DOSES,) 220 MCG/INH inhaler; Inhale 2 puffs into the lungs daily.  Dispense: 3 each; Refill: 1  5. OSA on CPAP  Good compliance   6. Hypothyroidism, adult  - TSH  7. Depression, major, recurrent, moderate ([place])  Needs to follow up with Dr. Shea Evans   8. Migraine without aura and  without status migrainosus, not intractable  Keep follow up with Dr. Jannifer Franklin  9. Chronic constipation  We will try Amitiza since seems preferred  - lubiprostone (AMITIZA) 24 MCG capsule; Take 1 capsule (24 mcg total) by mouth 2 (two) times daily with a meal.  Dispense: 180 capsule; Refill: 1  10. Morbid obesity with BMI of 40.0-44.9, adult [facility])  Discussed with the patient the risk posed by an increased BMI. Discussed importance of portion control, calorie counting and at least 150 minutes of physical activity weekly. Avoid sweet beverages and drink more water. Eat at least 6 servings of fruit and vegetables daily

## 2019-11-23 ENCOUNTER — Other Ambulatory Visit: Payer: Self-pay | Admitting: Family Medicine

## 2019-11-23 DIAGNOSIS — E034 Atrophy of thyroid (acquired): Secondary | ICD-10-CM

## 2019-11-23 LAB — TSH: TSH: 0.46 mIU/L (ref 0.40–4.50)

## 2019-11-23 MED ORDER — LEVOTHYROXINE SODIUM 112 MCG PO TABS: 112.0000 ug | ORAL_TABLET | Freq: Every day | ORAL | 1 refills | Status: DC

## 2019-12-11 ENCOUNTER — Other Ambulatory Visit: Payer: Self-pay | Admitting: Family Medicine

## 2019-12-11 DIAGNOSIS — R232 Flushing: Secondary | ICD-10-CM

## 2019-12-11 DIAGNOSIS — E1169 Type 2 diabetes mellitus with other specified complication: Secondary | ICD-10-CM
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
## 2019-12-15 ENCOUNTER — Other Ambulatory Visit: Payer: Self-pay | Admitting: Family Medicine

## 2019-12-15 DIAGNOSIS — E1169 Type 2 diabetes mellitus with other specified complication: Secondary | ICD-10-CM

## 2019-12-15 DIAGNOSIS — J4520 Mild intermittent asthma, uncomplicated: Secondary | ICD-10-CM

## 2019-12-15 DIAGNOSIS — E785 Hyperlipidemia, unspecified: Secondary | ICD-10-CM
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
## 2019-12-15 NOTE — Telephone Encounter (Signed)
The previous prescriptions were sent to [facility].
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
## 2019-12-16 ENCOUNTER — Other Ambulatory Visit: Payer: Self-pay

## 2019-12-16 ENCOUNTER — Ambulatory Visit (INDEPENDENT_AMBULATORY_CARE_PROVIDER_SITE_OTHER): Payer: 59 | Admitting: Neurology

## 2019-12-16 ENCOUNTER — Encounter: Payer: Self-pay | Admitting: Neurology

## 2019-12-16 VITALS — BP 157/86 | HR 81 | Ht 62.0 in | Wt 264.0 lb

## 2019-12-16 DIAGNOSIS — M5442 Lumbago with sciatica, left side: Secondary | ICD-10-CM

## 2019-12-16 DIAGNOSIS — G8929 Other chronic pain: Secondary | ICD-10-CM | POA: Diagnosis not present

## 2019-12-16 DIAGNOSIS — G43019 Migraine without aura, intractable, without status migrainosus: Secondary | ICD-10-CM

## 2019-12-16 MED ORDER — RIZATRIPTAN BENZOATE 10 MG PO TABS: ORAL_TABLET | ORAL | 3 refills | Status: DC

## 2019-12-16 MED ORDER — EMGALITY 120 MG/ML ~~LOC~~ SOAJ: 120.0000 mg | SUBCUTANEOUS | 11 refills | Status: DC

## 2019-12-16 MED ORDER — EMGALITY 120 MG/ML ~~LOC~~ SOAJ: 240.0000 mg | SUBCUTANEOUS | 0 refills | Status: DC

## 2019-12-16 MED ORDER — GABAPENTIN 400 MG PO CAPS: ORAL_CAPSULE | ORAL | 1 refills | Status: DC

## 2019-12-16 NOTE — Patient Instructions (Addendum)
Get Emgality injection from the pharmacy  1st month loading dose is 240 mg, followed by 120 mg every month Give the injection 3-4 months for maximum benefit  See you back in 6 months or sooner if needed

## 2019-12-16 NOTE — Progress Notes (Signed)
I have read the note, and I agree with the clinical assessment and plan.  Refugio Vandevoorde K Reynolds Kittel   

## 2019-12-22 ENCOUNTER — Telehealth: Payer: Self-pay

## 2019-12-22 NOTE — Telephone Encounter (Signed)
A PA has been sent via [facility] for Emgality Status pending  Key: [id number] - PA Case ID: [id number] - Rx #: [id number]

## 2019-12-28 ENCOUNTER — Encounter: Payer: Self-pay | Admitting: *Deleted

## 2019-12-28 NOTE — Telephone Encounter (Addendum)
Emgality approved  If you have any questions please contact [employer] at [phone]. PA Case: [id number], Status: Approved, Coverage Starts on: 12/23/2019 12:00:00 AM, Coverage Ends on: 06/20/2020 12:00:00 AM. Sent patient my chart to advise of approval.

## 2020-01-13 ENCOUNTER — Other Ambulatory Visit: Payer: Self-pay | Admitting: Psychiatry

## 2020-01-13 DIAGNOSIS — F411 Generalized anxiety disorder: Secondary | ICD-10-CM

## 2020-02-02 ENCOUNTER — Other Ambulatory Visit: Payer: Self-pay | Admitting: Psychiatry

## 2020-02-02 DIAGNOSIS — F411 Generalized anxiety disorder: Secondary | ICD-10-CM

## 2020-02-29 ENCOUNTER — Encounter: Payer: Self-pay | Admitting: Family Medicine

## 2020-03-06 ENCOUNTER — Other Ambulatory Visit: Payer: Self-pay

## 2020-03-06 ENCOUNTER — Encounter: Payer: Self-pay | Admitting: Family Medicine

## 2020-03-06 ENCOUNTER — Ambulatory Visit (INDEPENDENT_AMBULATORY_CARE_PROVIDER_SITE_OTHER): Payer: 59 | Admitting: Family Medicine

## 2020-03-06 ENCOUNTER — Telehealth: Payer: Self-pay | Admitting: Psychiatry

## 2020-03-06 VITALS — BP 130/82 | HR 87 | Temp 98.2°F | Resp 18 | Ht 62.0 in | Wt 261.1 lb

## 2020-03-06 DIAGNOSIS — F411 Generalized anxiety disorder: Secondary | ICD-10-CM

## 2020-03-06 DIAGNOSIS — F330 Major depressive disorder, recurrent, mild: Secondary | ICD-10-CM

## 2020-03-06 DIAGNOSIS — I209 Angina pectoris, unspecified: Secondary | ICD-10-CM

## 2020-03-06 DIAGNOSIS — R232 Flushing: Secondary | ICD-10-CM

## 2020-03-06 DIAGNOSIS — E1169 Type 2 diabetes mellitus with other specified complication: Secondary | ICD-10-CM

## 2020-03-06 DIAGNOSIS — K219 Gastro-esophageal reflux disease without esophagitis: Secondary | ICD-10-CM

## 2020-03-06 DIAGNOSIS — E785 Hyperlipidemia, unspecified: Secondary | ICD-10-CM | POA: Insufficient documentation

## 2020-03-06 DIAGNOSIS — I10 Essential (primary) hypertension: Secondary | ICD-10-CM

## 2020-03-06 DIAGNOSIS — G43009 Migraine without aura, not intractable, without status migrainosus: Secondary | ICD-10-CM

## 2020-03-06 DIAGNOSIS — F3341 Major depressive disorder, recurrent, in partial remission: Secondary | ICD-10-CM

## 2020-03-06 DIAGNOSIS — K5909 Other constipation: Secondary | ICD-10-CM

## 2020-03-06 DIAGNOSIS — E1159 Type 2 diabetes mellitus with other circulatory complications: Secondary | ICD-10-CM | POA: Diagnosis not present

## 2020-03-06 DIAGNOSIS — E034 Atrophy of thyroid (acquired): Secondary | ICD-10-CM

## 2020-03-06 DIAGNOSIS — J4540 Moderate persistent asthma, uncomplicated: Secondary | ICD-10-CM

## 2020-03-06 DIAGNOSIS — Z9989 Dependence on other enabling machines and devices: Secondary | ICD-10-CM

## 2020-03-06 DIAGNOSIS — G4733 Obstructive sleep apnea (adult) (pediatric): Secondary | ICD-10-CM

## 2020-03-06 LAB — POCT GLYCOSYLATED HEMOGLOBIN (HGB A1C): Hemoglobin A1C: 7.9 % — AB (ref 4.0–5.6)

## 2020-03-06 MED ORDER — AMLODIPINE BESYLATE 2.5 MG PO TABS: 2.5000 mg | ORAL_TABLET | Freq: Every day | ORAL | 1 refills | Status: DC

## 2020-03-06 MED ORDER — QUETIAPINE FUMARATE 50 MG PO TABS: ORAL_TABLET | ORAL | 0 refills | Status: DC

## 2020-03-06 MED ORDER — CLONIDINE HCL 0.1 MG PO TABS: 0.1000 mg | ORAL_TABLET | Freq: Two times a day (BID) | ORAL | 1 refills | Status: DC

## 2020-03-06 MED ORDER — LUBIPROSTONE 24 MCG PO CAPS: 24.0000 ug | ORAL_CAPSULE | Freq: Two times a day (BID) | ORAL | 0 refills | Status: DC

## 2020-03-06 MED ORDER — OMEPRAZOLE 40 MG PO CPDR: 40.0000 mg | DELAYED_RELEASE_CAPSULE | Freq: Every day | ORAL | 1 refills | Status: DC

## 2020-03-06 MED ORDER — ASMANEX (60 METERED DOSES) 220 MCG/INH IN AEPB: 2.0000 | INHALATION_SPRAY | Freq: Every day | RESPIRATORY_TRACT | 1 refills | Status: DC

## 2020-03-06 MED ORDER — METOPROLOL SUCCINATE ER 50 MG PO TB24: 50.0000 mg | ORAL_TABLET | Freq: Every day | ORAL | 1 refills | Status: DC

## 2020-03-06 MED ORDER — DULOXETINE HCL 60 MG PO CPEP: 60.0000 mg | ORAL_CAPSULE | Freq: Two times a day (BID) | ORAL | 0 refills | Status: DC

## 2020-03-06 MED ORDER — METFORMIN HCL ER 750 MG PO TB24: 1500.0000 mg | ORAL_TABLET | Freq: Every day | ORAL | 1 refills | Status: DC

## 2020-03-06 MED ORDER — OLMESARTAN MEDOXOMIL-HCTZ 40-25 MG PO TABS: 1.0000 | ORAL_TABLET | Freq: Every day | ORAL | 1 refills | Status: DC

## 2020-03-06 NOTE — Telephone Encounter (Signed)
I have sent meds to [facility]. Canceled medication sent to [facility]
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
## 2020-03-06 NOTE — Telephone Encounter (Signed)
I have sent duloxetine, Seroquel to pharmacy.  Received a message from her primary care doctor that she is trying to make an appointment with writer and needs medications until her appointment.

## 2020-03-24 ENCOUNTER — Other Ambulatory Visit: Payer: Self-pay

## 2020-03-24 MED ORDER — LINACLOTIDE 145 MCG PO CAPS: 145.0000 ug | ORAL_CAPSULE | Freq: Every day | ORAL | 2 refills | Status: DC
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
## 2020-05-01 ENCOUNTER — Encounter: Payer: Self-pay | Admitting: Urology

## 2020-05-01 ENCOUNTER — Other Ambulatory Visit: Payer: Self-pay

## 2020-05-01 ENCOUNTER — Ambulatory Visit (INDEPENDENT_AMBULATORY_CARE_PROVIDER_SITE_OTHER): Payer: 59 | Admitting: Urology

## 2020-05-01 VITALS — BP 162/96 | HR 97 | Ht 62.0 in | Wt 258.0 lb

## 2020-05-01 DIAGNOSIS — N952 Postmenopausal atrophic vaginitis: Secondary | ICD-10-CM | POA: Diagnosis not present

## 2020-05-01 DIAGNOSIS — N3946 Mixed incontinence: Secondary | ICD-10-CM

## 2020-05-01 LAB — BLADDER SCAN AMB NON-IMAGING: Scan Result: 18

## 2020-05-01 MED ORDER — PREMARIN 0.625 MG/GM VA CREA: 1.0000 | TOPICAL_CREAM | Freq: Every day | VAGINAL | 12 refills | Status: DC

## 2020-05-01 MED ORDER — OXYBUTYNIN CHLORIDE ER 5 MG PO TB24: 5.0000 mg | ORAL_TABLET | Freq: Every day | ORAL | 3 refills | Status: DC
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
## 2020-06-12 ENCOUNTER — Ambulatory Visit (INDEPENDENT_AMBULATORY_CARE_PROVIDER_SITE_OTHER): Payer: 59 | Admitting: Urology

## 2020-06-12 ENCOUNTER — Other Ambulatory Visit: Payer: Self-pay

## 2020-06-12 ENCOUNTER — Encounter: Payer: Self-pay | Admitting: Urology

## 2020-06-12 VITALS — BP 119/67 | HR 78 | Ht 61.0 in | Wt 257.0 lb

## 2020-06-12 DIAGNOSIS — N952 Postmenopausal atrophic vaginitis: Secondary | ICD-10-CM | POA: Diagnosis not present

## 2020-06-12 DIAGNOSIS — N3946 Mixed incontinence: Secondary | ICD-10-CM | POA: Diagnosis not present

## 2020-06-12 LAB — BLADDER SCAN AMB NON-IMAGING

## 2020-06-12 MED ORDER — OXYBUTYNIN CHLORIDE ER 5 MG PO TB24: ORAL_TABLET | ORAL | 3 refills | Status: DC

## 2020-06-12 MED ORDER — ESTRADIOL 0.1 MG/GM VA CREA
TOPICAL_CREAM | VAGINAL | 12 refills | Status: DC
Start: 2020-06-12 — End: 2023-08-05

## 2020-06-19 ENCOUNTER — Ambulatory Visit: Payer: 59 | Admitting: Neurology

## 2020-06-19 ENCOUNTER — Encounter: Payer: Self-pay | Admitting: Neurology

## 2020-06-19 ENCOUNTER — Telehealth: Payer: Self-pay | Admitting: Neurology

## 2020-06-19 ENCOUNTER — Other Ambulatory Visit: Payer: Self-pay

## 2020-06-19 VITALS — BP 136/84 | HR 82 | Ht 61.0 in | Wt 252.0 lb

## 2020-06-19 DIAGNOSIS — G43019 Migraine without aura, intractable, without status migrainosus: Secondary | ICD-10-CM

## 2020-06-19 MED ORDER — GABAPENTIN 400 MG PO CAPS: ORAL_CAPSULE | ORAL | 1 refills | Status: DC

## 2020-06-19 MED ORDER — RIZATRIPTAN BENZOATE 10 MG PO TABS: ORAL_TABLET | ORAL | 5 refills | Status: DC

## 2020-06-19 NOTE — Telephone Encounter (Signed)
Will you please check to see how much [employer] is for her, I ordered last visit, looks like approved, but she didn't get it, not sure if related to cost. Thanks

## 2020-06-19 NOTE — Progress Notes (Signed)
I have read the note, and I agree with the clinical assessment and plan.  Tarun Patchell K Kerina Simoneau   
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
## 2020-06-19 NOTE — Patient Instructions (Signed)
Will check on insurance coverage for Emgality If took expensive, consider Botox Please cut back treating all headaches, possible rebound component to headaches May consider Botox in the future  See you back in 6 months

## 2020-06-28 NOTE — Telephone Encounter (Signed)
Called patient's insurance [employer] to check on price of Emgality. The rep was able to tell me that the [facility] is approved but approved as a tier 3 medication. She could not tell me the exact price without the member's written or verbal consent but advised the patient can call [facility] to ask about the price.   Left message for patient to call back.
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
## 2020-07-07 ENCOUNTER — Ambulatory Visit: Payer: 59 | Admitting: Family Medicine

## 2020-07-07 DIAGNOSIS — E1169 Type 2 diabetes mellitus with other specified complication: Secondary | ICD-10-CM

## 2020-07-13 ENCOUNTER — Other Ambulatory Visit: Payer: Self-pay | Admitting: Family Medicine

## 2020-07-13 DIAGNOSIS — E1169 Type 2 diabetes mellitus with other specified complication: Secondary | ICD-10-CM

## 2020-07-13 DIAGNOSIS — E034 Atrophy of thyroid (acquired): Secondary | ICD-10-CM

## 2020-07-18 ENCOUNTER — Other Ambulatory Visit: Payer: Self-pay | Admitting: Family Medicine

## 2020-07-18 DIAGNOSIS — E1169 Type 2 diabetes mellitus with other specified complication: Secondary | ICD-10-CM

## 2020-07-18 DIAGNOSIS — E785 Hyperlipidemia, unspecified: Secondary | ICD-10-CM

## 2020-08-04 ENCOUNTER — Encounter: Payer: Self-pay | Admitting: Family Medicine

## 2020-08-04 DIAGNOSIS — R232 Flushing: Secondary | ICD-10-CM

## 2020-08-04 DIAGNOSIS — J4520 Mild intermittent asthma, uncomplicated: Secondary | ICD-10-CM

## 2020-08-04 DIAGNOSIS — J4540 Moderate persistent asthma, uncomplicated: Secondary | ICD-10-CM

## 2020-08-04 DIAGNOSIS — E034 Atrophy of thyroid (acquired): Secondary | ICD-10-CM

## 2020-08-04 DIAGNOSIS — K5909 Other constipation: Secondary | ICD-10-CM

## 2020-08-04 DIAGNOSIS — K219 Gastro-esophageal reflux disease without esophagitis: Secondary | ICD-10-CM

## 2020-08-04 DIAGNOSIS — E1169 Type 2 diabetes mellitus with other specified complication: Secondary | ICD-10-CM

## 2020-08-04 DIAGNOSIS — E785 Hyperlipidemia, unspecified: Secondary | ICD-10-CM

## 2020-08-04 DIAGNOSIS — I152 Hypertension secondary to endocrine disorders: Secondary | ICD-10-CM

## 2020-08-04 DIAGNOSIS — E1159 Type 2 diabetes mellitus with other circulatory complications: Secondary | ICD-10-CM

## 2020-08-08 ENCOUNTER — Other Ambulatory Visit: Payer: Self-pay | Admitting: Family Medicine

## 2020-08-08 DIAGNOSIS — I152 Hypertension secondary to endocrine disorders: Secondary | ICD-10-CM

## 2020-08-08 DIAGNOSIS — E1169 Type 2 diabetes mellitus with other specified complication: Secondary | ICD-10-CM

## 2020-08-08 DIAGNOSIS — K219 Gastro-esophageal reflux disease without esophagitis: Secondary | ICD-10-CM

## 2020-08-08 DIAGNOSIS — R232 Flushing: Secondary | ICD-10-CM

## 2020-08-08 DIAGNOSIS — E034 Atrophy of thyroid (acquired): Secondary | ICD-10-CM

## 2020-08-08 DIAGNOSIS — E1159 Type 2 diabetes mellitus with other circulatory complications: Secondary | ICD-10-CM

## 2020-08-08 DIAGNOSIS — E785 Hyperlipidemia, unspecified: Secondary | ICD-10-CM

## 2020-08-08 MED ORDER — OMEPRAZOLE 40 MG PO CPDR: 40.0000 mg | DELAYED_RELEASE_CAPSULE | Freq: Every day | ORAL | 0 refills | Status: DC

## 2020-08-08 MED ORDER — AMLODIPINE BESYLATE 2.5 MG PO TABS: 2.5000 mg | ORAL_TABLET | Freq: Every day | ORAL | 0 refills | Status: DC

## 2020-08-08 MED ORDER — SYNTHROID 112 MCG PO TABS: 112.0000 ug | ORAL_TABLET | Freq: Every day | ORAL | 0 refills | Status: DC

## 2020-08-08 MED ORDER — METFORMIN HCL ER 750 MG PO TB24: 1500.0000 mg | ORAL_TABLET | Freq: Every day | ORAL | 0 refills | Status: DC

## 2020-08-08 MED ORDER — METOPROLOL SUCCINATE ER 50 MG PO TB24: 50.0000 mg | ORAL_TABLET | Freq: Every day | ORAL | 0 refills | Status: DC

## 2020-08-08 MED ORDER — OLMESARTAN MEDOXOMIL-HCTZ 40-25 MG PO TABS: 1.0000 | ORAL_TABLET | Freq: Every day | ORAL | 0 refills | Status: DC

## 2020-08-08 MED ORDER — CLONIDINE HCL 0.1 MG PO TABS: 0.1000 mg | ORAL_TABLET | Freq: Two times a day (BID) | ORAL | 0 refills | Status: DC
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
## 2020-08-10 ENCOUNTER — Ambulatory Visit (INDEPENDENT_AMBULATORY_CARE_PROVIDER_SITE_OTHER): Payer: 59 | Admitting: Family Medicine

## 2020-08-10 ENCOUNTER — Other Ambulatory Visit: Payer: Self-pay

## 2020-08-10 ENCOUNTER — Encounter: Payer: Self-pay | Admitting: Family Medicine

## 2020-08-10 VITALS — BP 136/72 | HR 93 | Temp 98.2°F | Resp 18 | Ht 61.0 in | Wt 257.1 lb

## 2020-08-10 DIAGNOSIS — E785 Hyperlipidemia, unspecified: Secondary | ICD-10-CM | POA: Diagnosis not present

## 2020-08-10 DIAGNOSIS — E1169 Type 2 diabetes mellitus with other specified complication: Secondary | ICD-10-CM | POA: Diagnosis not present

## 2020-08-10 DIAGNOSIS — I152 Hypertension secondary to endocrine disorders: Secondary | ICD-10-CM

## 2020-08-10 DIAGNOSIS — E034 Atrophy of thyroid (acquired): Secondary | ICD-10-CM | POA: Diagnosis not present

## 2020-08-10 DIAGNOSIS — J4540 Moderate persistent asthma, uncomplicated: Secondary | ICD-10-CM

## 2020-08-10 DIAGNOSIS — G43009 Migraine without aura, not intractable, without status migrainosus: Secondary | ICD-10-CM

## 2020-08-10 DIAGNOSIS — Z9989 Dependence on other enabling machines and devices: Secondary | ICD-10-CM

## 2020-08-10 DIAGNOSIS — Z6841 Body Mass Index (BMI) 40.0 and over, adult: Secondary | ICD-10-CM

## 2020-08-10 DIAGNOSIS — Z23 Encounter for immunization: Secondary | ICD-10-CM | POA: Diagnosis not present

## 2020-08-10 DIAGNOSIS — Z86010 Personal history of colonic polyps: Secondary | ICD-10-CM

## 2020-08-10 DIAGNOSIS — Z1231 Encounter for screening mammogram for malignant neoplasm of breast: Secondary | ICD-10-CM

## 2020-08-10 DIAGNOSIS — G4733 Obstructive sleep apnea (adult) (pediatric): Secondary | ICD-10-CM

## 2020-08-10 DIAGNOSIS — F322 Major depressive disorder, single episode, severe without psychotic features: Secondary | ICD-10-CM

## 2020-08-10 DIAGNOSIS — E559 Vitamin D deficiency, unspecified: Secondary | ICD-10-CM

## 2020-08-10 DIAGNOSIS — I209 Angina pectoris, unspecified: Secondary | ICD-10-CM

## 2020-08-10 DIAGNOSIS — R232 Flushing: Secondary | ICD-10-CM

## 2020-08-10 DIAGNOSIS — E1159 Type 2 diabetes mellitus with other circulatory complications: Secondary | ICD-10-CM

## 2020-08-10 DIAGNOSIS — K219 Gastro-esophageal reflux disease without esophagitis: Secondary | ICD-10-CM | POA: Diagnosis not present

## 2020-08-10 LAB — POCT GLYCOSYLATED HEMOGLOBIN (HGB A1C): Hemoglobin A1C: 8.1 % — AB (ref 4.0–5.6)

## 2020-08-10 MED ORDER — AMLODIPINE BESYLATE 2.5 MG PO TABS: 2.5000 mg | ORAL_TABLET | Freq: Every day | ORAL | 1 refills | Status: DC

## 2020-08-10 MED ORDER — FLUTICASONE PROPIONATE 50 MCG/ACT NA SUSP: 2.0000 | Freq: Every day | NASAL | 0 refills | Status: DC | PRN

## 2020-08-10 MED ORDER — METOPROLOL SUCCINATE ER 50 MG PO TB24: 50.0000 mg | ORAL_TABLET | Freq: Every day | ORAL | 1 refills | Status: DC

## 2020-08-10 MED ORDER — METFORMIN HCL ER 750 MG PO TB24: 1500.0000 mg | ORAL_TABLET | Freq: Every day | ORAL | 1 refills | Status: DC

## 2020-08-10 MED ORDER — PIOGLITAZONE HCL 15 MG PO TABS: 15.0000 mg | ORAL_TABLET | Freq: Every day | ORAL | 1 refills | Status: DC

## 2020-08-10 MED ORDER — ALBUTEROL SULFATE HFA 108 (90 BASE) MCG/ACT IN AERS: 2.0000 | INHALATION_SPRAY | Freq: Four times a day (QID) | RESPIRATORY_TRACT | 1 refills | Status: DC | PRN

## 2020-08-10 MED ORDER — OLMESARTAN MEDOXOMIL-HCTZ 40-25 MG PO TABS: 1.0000 | ORAL_TABLET | Freq: Every day | ORAL | 1 refills | Status: DC

## 2020-08-10 MED ORDER — CLONIDINE HCL 0.1 MG PO TABS: 0.1000 mg | ORAL_TABLET | Freq: Two times a day (BID) | ORAL | 1 refills | Status: DC

## 2020-08-10 MED ORDER — ASMANEX (60 METERED DOSES) 220 MCG/INH IN AEPB: 2.0000 | INHALATION_SPRAY | Freq: Every day | RESPIRATORY_TRACT | 1 refills | Status: DC

## 2020-08-10 MED ORDER — OMEPRAZOLE 40 MG PO CPDR: 40.0000 mg | DELAYED_RELEASE_CAPSULE | Freq: Every day | ORAL | 1 refills | Status: DC

## 2020-08-10 MED ORDER — LINACLOTIDE 145 MCG PO CAPS: 145.0000 ug | ORAL_CAPSULE | Freq: Every day | ORAL | 1 refills | Status: DC

## 2020-08-11 LAB — CBC WITH DIFFERENTIAL/PLATELET
Absolute Monocytes: 960 cells/uL — ABNORMAL HIGH (ref 200–950)
Basophils Absolute: 86 cells/uL (ref 0–200)
Basophils Relative: 0.9 %
Eosinophils Absolute: 298 cells/uL (ref 15–500)
Eosinophils Relative: 3.1 %
HCT: 37.7 % (ref 35.0–45.0)
Hemoglobin: 13 g/dL (ref 11.7–15.5)
Lymphs Abs: 2371 cells/uL (ref 850–3900)
MCH: 28.6 pg (ref 27.0–33.0)
MCHC: 34.5 g/dL (ref 32.0–36.0)
MCV: 83 fL (ref 80.0–100.0)
MPV: 10.6 fL (ref 7.5–12.5)
Monocytes Relative: 10 %
Neutro Abs: 5885 cells/uL (ref 1500–7800)
Neutrophils Relative %: 61.3 %
Platelets: 310 10*3/uL (ref 140–400)
RBC: 4.54 10*6/uL (ref 3.80–5.10)
RDW: 13.9 % (ref 11.0–15.0)
Total Lymphocyte: 24.7 %
WBC: 9.6 10*3/uL (ref 3.8–10.8)

## 2020-08-11 LAB — COMPLETE METABOLIC PANEL WITH GFR
AG Ratio: 1.4 (calc) (ref 1.0–2.5)
ALT: 10 U/L (ref 6–29)
AST: 15 U/L (ref 10–35)
Albumin: 4.3 g/dL (ref 3.6–5.1)
Alkaline phosphatase (APISO): 63 U/L (ref 37–153)
BUN: 12 mg/dL (ref 7–25)
CO2: 31 mmol/L (ref 20–32)
Calcium: 9.7 mg/dL (ref 8.6–10.4)
Chloride: 99 mmol/L (ref 98–110)
Creat: 0.65 mg/dL (ref 0.50–0.99)
GFR, Est African American: 111 mL/min/{1.73_m2} (ref >=60)
GFR, Est Non African American: 96 mL/min/{1.73_m2} (ref >=60)
Globulin: 3 g/dL (calc) (ref 1.9–3.7)
Glucose, Bld: 189 mg/dL — ABNORMAL HIGH (ref 65–99)
Potassium: 3.9 mmol/L (ref 3.5–5.3)
Sodium: 142 mmol/L (ref 135–146)
Total Bilirubin: 0.5 mg/dL (ref 0.2–1.2)
Total Protein: 7.3 g/dL (ref 6.1–8.1)

## 2020-08-11 LAB — LIPID PANEL
Cholesterol: 215 mg/dL — ABNORMAL HIGH (ref <200)
HDL: 43 mg/dL — ABNORMAL LOW (ref >=50)
LDL Cholesterol (Calc): 127 mg/dL (calc) — ABNORMAL HIGH
Non-HDL Cholesterol (Calc): 172 mg/dL (calc) — ABNORMAL HIGH (ref <130)
Total CHOL/HDL Ratio: 5 (calc) — ABNORMAL HIGH (ref <5.0)
Triglycerides: 314 mg/dL — ABNORMAL HIGH (ref <150)

## 2020-08-11 LAB — MICROALBUMIN / CREATININE URINE RATIO
Creatinine, Urine: 106 mg/dL (ref 20–275)
Microalb Creat Ratio: 8 mcg/mg creat (ref <30)
Microalb, Ur: 0.8 mg/dL

## 2020-08-11 LAB — TSH: TSH: 11.47 mIU/L — ABNORMAL HIGH (ref 0.40–4.50)

## 2020-08-11 LAB — VITAMIN D 25 HYDROXY (VIT D DEFICIENCY, FRACTURES): Vit D, 25-Hydroxy: 14 ng/mL — ABNORMAL LOW (ref 30–100)

## 2020-08-16 ENCOUNTER — Encounter: Payer: Self-pay | Admitting: Family Medicine

## 2020-08-16 ENCOUNTER — Other Ambulatory Visit: Payer: Self-pay | Admitting: Family Medicine

## 2020-09-17 NOTE — Progress Notes (Signed)
09/18/2020 10:11 AM   Beverly Barrett 03/10/1961 [id number]  Referring provider: Steele Sizer, MD [address] [place],  [place] [address]  Chief Complaint  Patient presents with  . Follow-up    75mth follow-up   Urological history: 1. Mixed urinary incontinence - risks factors of age, depression, obesity, DM, sleep apnea, LBP and diuretics - managed with oxybutynin IR 5 mg TID - PVR 13 mL  2. Vaginal atrophy - managed with vaginal estrogen cream   3. Renal cyst - seen on contrast CT 2021 There is a small probable benign left renal upper pole cyst.  HPI: Beverly Barrett is a 60 y.o. female who presents today for a three months follow up.    The patient is  experiencing urgency x 4-7 (worse), frequency x 4-7 (stable), is restricting fluids to avoid visits to the restroom, is engaging in toilet mapping, incontinence x 0-3 (improved) and nocturia x 0-3 (stable).   Her BP is 132/80.   Her PVR is 37 mL.    She is taking oxybutynin XL 5 mg TID.    UA 6-10 [place], 6-10 WBC's, 6-10 RBC's and a few bacteria.    She is applying the vaginal estrogen cream three nights weekly.    She has sleep apnea and is sleeping with her CPAP.    She is having lower abdominal pain and constipation.    When reviewing medication coverage for OAB agent, it appears that the Myrbetriq is now covered with her new insurance.  She would like to go ahead and try that as she had better success with her incontinence with the Myrbetriq.  PMH: Past Medical History:  Diagnosis Date  . Anginal pain ([place])    none in approx 2 yrs  . Anxiety   . Asthma   . Chronic lower back pain   . Common migraine with intractable migraine 09/20/2016  . Depression    suicidal ideation  . Diabetes mellitus without complication ([place])   . GERD (gastroesophageal reflux disease)   . Headache    migraines -3x/wk  . Hyperlipidemia   . Hypertension   . Hypothyroidism   . Motion sickness    cars  .  Multilevel degenerative disc disease   . Shortness of breath dyspnea   . Sleep apnea    CPAP  . Vertigo   . Wears dentures    partial upper    Surgical History: Past Surgical History:  Procedure Laterality Date  . ABDOMINAL HYSTERECTOMY    . bladder tach  1995  . CESAREAN SECTION    . COLONOSCOPY WITH PROPOFOL N/A 05/29/2015   Procedure: COLONOSCOPY WITH PROPOFOL;  Surgeon: Lucilla Lame, MD;  Location: [facility];  Service: Endoscopy;  Laterality: N/A;  Latex allergy sleep apnea - no CPAP machine (yet)  . ESOPHAGOGASTRODUODENOSCOPY N/A 04/23/2017   Procedure: ESOPHAGOGASTRODUODENOSCOPY (EGD);  Surgeon: Lin Landsman, MD;  Location: [facility];  Service: Gastroenterology;  Laterality: N/A;  Latex allergy sleep apnea  . POLYPECTOMY  05/29/2015   Procedure: POLYPECTOMY;  Surgeon: Lucilla Lame, MD;  Location: [facility];  Service: Endoscopy;;    Home Medications:  Allergies as of 09/18/2020      Reactions   Latex Swelling   Pt unsure of reaction pt states something happened while in the hospital after surgery.      Medication List       Accurate as of September 18, 2020 10:11 AM. If you have any questions, ask your nurse  or doctor.        STOP taking these medications   oxybutynin 5 MG 24 hr tablet Commonly known as: DITROPAN-XL Stopped by: Zara Council, PA-C     TAKE these medications   acetaminophen 500 MG tablet Commonly known as: TYLENOL Take 2 tablets (1,000 mg total) by mouth every 6 (six) hours as needed.   albuterol 108 (90 Base) MCG/ACT inhaler Commonly known as: Ventolin HFA Inhale 2 puffs into the lungs every 6 (six) hours as needed for wheezing or shortness of breath.   amLODipine 2.5 MG tablet Commonly known as: NORVASC Take 1 tablet (2.5 mg total) by mouth daily.   Asmanex (60 Metered Doses) 220 MCG/INH inhaler Generic drug: mometasone Inhale 2 puffs into the lungs daily.   aspirin 81 MG EC tablet Take 81 mg by  mouth daily.   cloNIDine 0.1 MG tablet Commonly known as: CATAPRES Take 1 tablet (0.1 mg total) by mouth 2 (two) times daily.   Contour Next Test test strip Generic drug: glucose blood USE TO TEST ONCE DAILY   cyclobenzaprine 5 MG tablet Commonly known as: FLEXERIL Take 1 tablet (5 mg total) by mouth 3 (three) times daily as needed.   docusate sodium 100 MG capsule Commonly known as: COLACE Take 100 mg by mouth daily.   DULoxetine 60 MG capsule Commonly known as: Cymbalta Take 1 capsule (60 mg total) by mouth 2 (two) times daily. Patient needs appointment prior to future refills   EQ Allergy Relief 10 MG tablet Generic drug: loratadine TAKE ONE TABLET BY MOUTH ONCE DAILY   estradiol 0.1 MG/GM vaginal cream Commonly known as: ESTRACE VAGINAL Apply 0.5mg  (pea-sized amount)  just inside the vaginal introitus with a finger-tip on Monday, Wednesday and Friday nights.   fluticasone 50 MCG/ACT nasal spray Commonly known as: FLONASE Place 2 sprays into both nostrils daily as needed for allergies.   gabapentin 400 MG capsule Commonly known as: Neurontin 1 capsule in the morning and at midday, 2 in the evening   hydrOXYzine 25 MG tablet Commonly known as: ATARAX/VISTARIL TAKE 1 TABLET BY MOUTH THREE TIMES DAILY AS NEEDED (FOR  SEVERE  ANXIETY  ONLY)   ketoconazole 2 % cream Commonly known as: NIZORAL Apply 1 application topically daily.   linaclotide 145 MCG Caps capsule Commonly known as: LINZESS Take 1 capsule (145 mcg total) by mouth daily before breakfast.   metFORMIN 750 MG 24 hr tablet Commonly known as: GLUCOPHAGE-XR Take 2 tablets (1,500 mg total) by mouth daily with breakfast.   metoprolol succinate 50 MG 24 hr tablet Commonly known as: TOPROL-XL Take 1 tablet (50 mg total) by mouth daily. Take with or immediately following a meal.   mirabegron ER 25 MG Tb24 tablet Commonly known as: MYRBETRIQ Take 1 tablet (25 mg total) by mouth daily. Started by: Zara Council, PA-C   mirabegron ER 25 MG Tb24 tablet Commonly known as: MYRBETRIQ Take 1 tablet (25 mg total) by mouth daily. Started by: Zara Council, PA-C   montelukast 10 MG tablet Commonly known as: SINGULAIR TAKE 1 TABLET BY MOUTH AT BEDTIME   nitroGLYCERIN 0.4 MG SL tablet Commonly known as: NITROSTAT Place 1 tablet (0.4 mg total) under the tongue every 5 (five) minutes as needed.   olmesartan-hydrochlorothiazide 40-25 MG tablet Commonly known as: BENICAR HCT Take 1 tablet by mouth daily.   omeprazole 40 MG capsule Commonly known as: PRILOSEC Take 1 capsule (40 mg total) by mouth daily.   ONE TOUCH DELICA LANCING DEV  Misc 1 each by Does not apply route daily.   OneTouch Delica Lancets 32K Misc 1 each by Does not apply route daily.   pioglitazone 15 MG tablet Commonly known as: Actos Take 1 tablet (15 mg total) by mouth daily.   polyethylene glycol 17 g packet Commonly known as: MiraLax Take 17 g by mouth daily as needed for mild constipation.   QUEtiapine 50 MG tablet Commonly known as: SEROQUEL TAKE ONE TO ONE AND ONE-HALF TABLETS BY MOUTH AT BEDTIME   rizatriptan 10 MG tablet Commonly known as: MAXALT TAKE 1 TABLET BY MOUTH THREE TIMES DAILY AS NEEDED FOR  MIGRAINE   senna 8.6 MG Tabs tablet Commonly known as: SENOKOT Take 1 tablet by mouth daily.   Synthroid 112 MCG tablet Generic drug: levothyroxine Take 1 tablet (112 mcg total) by mouth daily before breakfast.       Allergies:  Allergies  Allergen Reactions  . Latex Swelling    Pt unsure of reaction pt states something happened while in the hospital after surgery.    Family History: Family History  Problem Relation Age of Onset  . Diabetes Sister   . Anxiety disorder Sister   . Depression Sister   . Hypertension Sister   . Cirrhosis Mother   . Diabetes Mother   . Cirrhosis Father   . Breast cancer Sister 71  . Heart attack Brother   . Alcohol abuse Brother   . Drug abuse Brother    . Prostate cancer Neg Hx   . Kidney cancer Neg Hx   . Bladder Cancer Neg Hx     Social History:  reports that she has never smoked. She has never used smokeless tobacco. She reports that she does not drink alcohol and does not use drugs.  ROS: For pertinent review of systems please refer to history of present illness  Physical Exam: BP 132/80   Pulse 89   Ht 5\' 1"  (1.549 m)   Wt 252 lb (114.3 kg)   BMI 47.61 kg/m   Constitutional:  Well nourished. Alert and oriented, No acute distress. HEENT: [place] AT, mask in place.  Trachea midline Cardiovascular: No clubbing, cyanosis, or edema. Respiratory: Normal respiratory effort, no increased work of breathing. Neurologic: Grossly intact, no focal deficits, moving all 4 extremities. Psychiatric: Normal mood and affect.   Laboratory Data: Lab Results  Component Value Date   WBC 9.6 08/10/2020   HGB 13.0 08/10/2020   HCT 37.7 08/10/2020   MCV 83.0 08/10/2020   PLT 310 08/10/2020    Lab Results  Component Value Date   CREATININE 0.65 08/10/2020     Lab Results  Component Value Date   HGBA1C 8.1 (A) 08/10/2020    Lab Results  Component Value Date   TSH 11.47 (H) 08/10/2020       Component Value Date/Time   CHOL 215 (H) 08/10/2020 1057   CHOL 192 12/20/2015 1012   HDL 43 (L) 08/10/2020 1057   HDL 46 12/20/2015 1012   CHOLHDL 5.0 (H) 08/10/2020 1057   VLDL 44 (H) 02/12/2017 1059   LDLCALC 127 (H) 08/10/2020 1057    Lab Results  Component Value Date   AST 15 08/10/2020   Lab Results  Component Value Date   ALT 10 08/10/2020   Urinalysis Component     Latest Ref Rng & Units 09/18/2020  Color, Urine     YELLOW YELLOW  Appearance     CLEAR HAZY (A)  Specific Gravity, Urine  1.005 - 1.030 1.025  pH     5.0 - 8.0 5.5  Glucose, UA     NEGATIVE mg/dL NEGATIVE  Hgb urine dipstick     NEGATIVE TRACE (A)  Bilirubin Urine     NEGATIVE NEGATIVE  Ketones, ur     NEGATIVE mg/dL TRACE (A)  Protein      NEGATIVE mg/dL NEGATIVE  Nitrite     NEGATIVE NEGATIVE  Leukocytes,Ua     NEGATIVE SMALL (A)  RBC / HPF     0 - 5 RBC/hpf 6-10  WBC, UA     0 - 5 WBC/hpf 6-10  Bacteria, UA     NONE SEEN FEW (A)  Squamous Epithelial / LPF     0 - 5 6-10   I have reviewed the labs  Pertinent Imaging: Results for ANNARAE, MACNAIR (MRN [id number]) as of 09/18/2020 10:15  Ref. Range 09/18/2020 09:51  Scan Result Unknown 37 ml     Assessment & Plan:    1. Mixed urinary Incontinence -Patient's insurance now appears to cover Myrbetriq and since that agent worked better for her I will go ahead and give her #28 samples of the 25 mg of Myrbetriq and send a prescription into her pharmacy to check the coverage - UA with leuks and micro heme - will send for culture as she is having lower abdominal pain  -She will return in 3 weeks for OAB questionnaire and PVR and to report the insurance coverage of the Myrbetriq                              2. Vaginal atrophy -Continue the vaginal estrogen cream 3 nights weekly  Return in about 3 weeks (around 10/09/2020) for PVR and OAB questionnaire.  These notes generated with voice recognition software. I apologize for typographical errors.  Zara Council, PA-C  [facility] [address] [place], [place] [address] [phone]

## 2020-09-18 ENCOUNTER — Ambulatory Visit (INDEPENDENT_AMBULATORY_CARE_PROVIDER_SITE_OTHER): Payer: 59 | Admitting: Urology

## 2020-09-18 ENCOUNTER — Other Ambulatory Visit: Payer: Self-pay | Admitting: *Deleted

## 2020-09-18 ENCOUNTER — Other Ambulatory Visit: Payer: Self-pay

## 2020-09-18 ENCOUNTER — Encounter: Payer: Self-pay | Admitting: Urology

## 2020-09-18 ENCOUNTER — Other Ambulatory Visit
Admission: RE | Admit: 2020-09-18 | Discharge: 2020-09-18 | Disposition: A | Discharge status: Home / Self Care | Payer: 59 | Attending: Urology | Admitting: Urology

## 2020-09-18 VITALS — BP 132/80 | HR 89 | Ht 61.0 in | Wt 252.0 lb

## 2020-09-18 DIAGNOSIS — N3946 Mixed incontinence: Secondary | ICD-10-CM

## 2020-09-18 DIAGNOSIS — N281 Cyst of kidney, acquired: Secondary | ICD-10-CM

## 2020-09-18 DIAGNOSIS — N952 Postmenopausal atrophic vaginitis: Secondary | ICD-10-CM | POA: Diagnosis not present

## 2020-09-18 LAB — BLADDER SCAN AMB NON-IMAGING: Scan Result: 37

## 2020-09-18 LAB — URINALYSIS, COMPLETE (UACMP) WITH MICROSCOPIC
Bilirubin Urine: NEGATIVE
Glucose, UA: NEGATIVE mg/dL
Nitrite: NEGATIVE
Protein, ur: NEGATIVE mg/dL
Specific Gravity, Urine: 1.025 (ref 1.005–1.030)
pH: 5.5 (ref 5.0–8.0)

## 2020-09-18 MED ORDER — MIRABEGRON ER 25 MG PO TB24: 25.0000 mg | ORAL_TABLET | Freq: Every day | ORAL | 0 refills | Status: DC

## 2020-09-18 MED ORDER — MIRABEGRON ER 25 MG PO TB24: 25.0000 mg | ORAL_TABLET | Freq: Every day | ORAL | 3 refills | Status: DC

## 2020-09-20 ENCOUNTER — Telehealth: Payer: Self-pay | Admitting: Family Medicine

## 2020-09-20 ENCOUNTER — Encounter: Payer: Self-pay | Admitting: Family Medicine

## 2020-09-20 LAB — URINE CULTURE: Culture: >=100000 — AB

## 2020-09-20 MED ORDER — NITROFURANTOIN MONOHYD MACRO 100 MG PO CAPS: 100.0000 mg | ORAL_CAPSULE | Freq: Two times a day (BID) | ORAL | 0 refills | Status: DC

## 2020-09-20 NOTE — Telephone Encounter (Signed)
-----   Message from Nori Riis, PA-C sent at 09/20/2020  9:29 AM EST ----- Please let Mrs. Henthorn know that her urine culture was positive for infection and she needs to start nitrofurantoin 100 mg, BID x 7 days.

## 2020-09-20 NOTE — Telephone Encounter (Signed)
LMOM for patient to return call. I also sent a Mychart message. Patient is to start Nitrofurantoin and it was sent to [facility].

## 2020-10-06 ENCOUNTER — Telehealth: Payer: Self-pay | Admitting: Family Medicine

## 2020-10-06 NOTE — Telephone Encounter (Addendum)
LM for patient to return call. I want to be sure patient is doing well on Myrbetriq and is wanting to get this filled. PA cannot be started and I attempted a PA for the medication but the code will not work. I called the insurance company and they gave me another number to call. The number has been disconnected. If patient is interested in the medication she may need to call the insurance company to get them to start the Prior Authorization.

## 2020-10-09 ENCOUNTER — Telehealth: Payer: Self-pay

## 2020-10-09 NOTE — Telephone Encounter (Signed)
Copied from [place] [id number]. Topic: General - Other >> Oct 09, 2020  3:08 PM Loma Boston wrote: Reason for CRM: Reach out to pt as states her ins is changing to [employer]. States that Dr Ancil Boozer used incorrect info when she order her medication,  She only has a fax # if 1 [phone].Tried to verify #'s called from pharmacy but each time I give her a #  she says it is correct even as addresses are not matching. It is not even same state. She is not able to get her medication till this gets resolved as previous company bought our her pharmacy states pt. Told pt to have the pharmacy FU. Told pt that nurse would FU to verify and to find all the info she had available. Did not chg information as could not produce a # or address that matched to pharmacy sites, pt seemed confused.

## 2020-10-10 ENCOUNTER — Other Ambulatory Visit: Payer: Self-pay

## 2020-10-10 ENCOUNTER — Telehealth: Payer: Self-pay | Admitting: Family Medicine

## 2020-10-10 DIAGNOSIS — E034 Atrophy of thyroid (acquired): Secondary | ICD-10-CM

## 2020-10-10 DIAGNOSIS — R232 Flushing: Secondary | ICD-10-CM

## 2020-10-10 DIAGNOSIS — F330 Major depressive disorder, recurrent, mild: Secondary | ICD-10-CM

## 2020-10-10 DIAGNOSIS — F3341 Major depressive disorder, recurrent, in partial remission: Secondary | ICD-10-CM

## 2020-10-10 DIAGNOSIS — E1169 Type 2 diabetes mellitus with other specified complication: Secondary | ICD-10-CM

## 2020-10-10 DIAGNOSIS — K219 Gastro-esophageal reflux disease without esophagitis: Secondary | ICD-10-CM

## 2020-10-10 DIAGNOSIS — J4520 Mild intermittent asthma, uncomplicated: Secondary | ICD-10-CM

## 2020-10-10 DIAGNOSIS — F411 Generalized anxiety disorder: Secondary | ICD-10-CM

## 2020-10-10 DIAGNOSIS — E1159 Type 2 diabetes mellitus with other circulatory complications: Secondary | ICD-10-CM

## 2020-10-10 DIAGNOSIS — J4540 Moderate persistent asthma, uncomplicated: Secondary | ICD-10-CM

## 2020-10-10 MED ORDER — CONTOUR NEXT TEST VI STRP: ORAL_STRIP | 2 refills | Status: DC

## 2020-10-10 MED ORDER — OMEPRAZOLE 40 MG PO CPDR: 40.0000 mg | DELAYED_RELEASE_CAPSULE | Freq: Every day | ORAL | 1 refills | Status: DC

## 2020-10-10 MED ORDER — CLONIDINE HCL 0.1 MG PO TABS: 0.1000 mg | ORAL_TABLET | Freq: Two times a day (BID) | ORAL | 1 refills | Status: DC

## 2020-10-10 MED ORDER — AMLODIPINE BESYLATE 2.5 MG PO TABS: 2.5000 mg | ORAL_TABLET | Freq: Every day | ORAL | 1 refills | Status: DC

## 2020-10-10 MED ORDER — LINACLOTIDE 145 MCG PO CAPS: 145.0000 ug | ORAL_CAPSULE | Freq: Every day | ORAL | 1 refills | Status: DC

## 2020-10-10 MED ORDER — METFORMIN HCL ER 750 MG PO TB24: 1500.0000 mg | ORAL_TABLET | Freq: Every day | ORAL | 1 refills | Status: DC

## 2020-10-10 MED ORDER — ALBUTEROL SULFATE HFA 108 (90 BASE) MCG/ACT IN AERS: 2.0000 | INHALATION_SPRAY | Freq: Four times a day (QID) | RESPIRATORY_TRACT | 1 refills | Status: DC | PRN

## 2020-10-10 MED ORDER — PIOGLITAZONE HCL 15 MG PO TABS
15.0000 mg | ORAL_TABLET | Freq: Every day | ORAL | 1 refills | Status: DC
Start: 2020-10-10 — End: 2021-03-28

## 2020-10-10 MED ORDER — FLUTICASONE PROPIONATE 50 MCG/ACT NA SUSP: 2.0000 | Freq: Every day | NASAL | 0 refills | Status: DC | PRN

## 2020-10-10 MED ORDER — METOPROLOL SUCCINATE ER 50 MG PO TB24: 50.0000 mg | ORAL_TABLET | Freq: Every day | ORAL | 1 refills | Status: DC

## 2020-10-10 MED ORDER — MONTELUKAST SODIUM 10 MG PO TABS: 10.0000 mg | ORAL_TABLET | Freq: Every day | ORAL | 3 refills | Status: DC

## 2020-10-10 MED ORDER — RIZATRIPTAN BENZOATE 10 MG PO TABS: ORAL_TABLET | ORAL | 5 refills | Status: DC

## 2020-10-10 MED ORDER — OLMESARTAN MEDOXOMIL-HCTZ 40-25 MG PO TABS: 1.0000 | ORAL_TABLET | Freq: Every day | ORAL | 1 refills | Status: DC

## 2020-10-10 NOTE — Telephone Encounter (Signed)
I just spoke with patient and she stated that her new mail order pharmacy is [facility] there her medications need to go there.  Patient's last medication refills were on 08/10/2020 and was sent to her old mail order pharmacy, [facility].  Please remove [facility] from her preferred mail order pharmacy and add [facility].  Please also resend her medications to [facility].  [facility] [address] [address] [place] [address] [phone] (p) [phone] (f)

## 2020-10-10 NOTE — Telephone Encounter (Signed)
Pharmacy information has been updated and medications pended for resubmission.

## 2020-10-10 NOTE — Telephone Encounter (Signed)
Left message informed patient Myrbetriq has been approved. Approval dates: 10/09/2020-10/08/2021

## 2020-10-15 NOTE — Progress Notes (Signed)
10/16/2020 11:26 AM   [name] [birth year] [id number]  Referring provider: Steele Sizer, MD [address] [place],  [place] [address]  Chief Complaint  Patient presents with  . Follow-up    105mth follow-up   Urological history: 1. Mixed urinary incontinence - risks factors of age, depression, obesity, DM, sleep apnea, LBP and diuretics - managed with oxybutynin IR 5 mg TID - PVR 0 mL   2. Vaginal atrophy - managed with vaginal estrogen cream   3. Renal cyst - seen on contrast CT 2021 There is a small probable benign left renal upper pole cyst.  HPI: Quanisha Drewry is a 60 y.o. female who presents today for a three week follow up.    The patient is  experiencing urgency x 4-7 (stable), frequency x 4-7 (stable), is restricting fluids to avoid visits to the restroom, is engaging in toilet mapping, incontinence x 0-3 (stable) and nocturia x 0-3 (stable).   Her BP is 134/84   Her PVR is 0 mL.    She found the Mybetriq more effective than the oxybutynin, but it was cost prohibitive.   She is taking oxybutynin XL 5 mg TID.    Urine culture positive for Enterococcus faecalis  She is applying the vaginal estrogen cream three nights weekly.    She has sleep apnea and is sleeping with her CPAP.    Patient denies any modifying or aggravating factors.  Patient denies any gross hematuria, dysuria or suprapubic/flank pain.  Patient denies any fevers, chills, nausea or vomiting.   UA is negative for micro heme.    PMH: Past Medical History:  Diagnosis Date  . Anginal pain ([place])    none in approx 2 yrs  . Anxiety   . Asthma   . Chronic lower back pain   . Common migraine with intractable migraine 09/20/2016  . Depression    suicidal ideation  . Diabetes mellitus without complication ([place] [facility])   . GERD (gastroesophageal reflux disease)   . Headache    migraines -3x/wk  . Hyperlipidemia   . Hypertension   . Hypothyroidism   . Motion sickness    cars   . Multilevel degenerative disc disease   . Shortness of breath dyspnea   . Sleep apnea    CPAP  . Vertigo   . Wears dentures    partial upper    Surgical History: Past Surgical History:  Procedure Laterality Date  . ABDOMINAL HYSTERECTOMY    . bladder tach  1995  . CESAREAN SECTION    . COLONOSCOPY WITH PROPOFOL N/A 05/29/2015   Procedure: COLONOSCOPY WITH PROPOFOL;  Surgeon: Lucilla Lame, MD;  Location: [facility];  Service: Endoscopy;  Laterality: N/A;  Latex allergy sleep apnea - no CPAP machine (yet)  . ESOPHAGOGASTRODUODENOSCOPY N/A 04/23/2017   Procedure: ESOPHAGOGASTRODUODENOSCOPY (EGD);  Surgeon: Lin Landsman, MD;  Location: [facility];  Service: Gastroenterology;  Laterality: N/A;  Latex allergy sleep apnea  . POLYPECTOMY  05/29/2015   Procedure: POLYPECTOMY;  Surgeon: Lucilla Lame, MD;  Location: [facility];  Service: Endoscopy;;    Home Medications:  Allergies as of 10/16/2020      Reactions   Latex Swelling   Pt unsure of reaction pt states something happened while in the hospital after surgery.      Medication List       Accurate as of October 16, 2020 11:26 AM. If you have any questions, ask your nurse or doctor.  STOP taking these medications   EQ Allergy Relief 10 MG tablet Generic drug: loratadine Stopped by: Zara Council, PA-C   mirabegron ER 25 MG Tb24 tablet Commonly known as: MYRBETRIQ Stopped by: Zara Council, PA-C   nitrofurantoin (macrocrystal-monohydrate) 100 MG capsule Commonly known as: MACROBID Stopped by: Zara Council, PA-C     TAKE these medications   acetaminophen 500 MG tablet Commonly known as: TYLENOL Take 2 tablets (1,000 mg total) by mouth every 6 (six) hours as needed.   albuterol 108 (90 Base) MCG/ACT inhaler Commonly known as: Ventolin HFA Inhale 2 puffs into the lungs every 6 (six) hours as needed for wheezing or shortness of breath.   amLODipine 2.5 MG tablet Commonly  known as: NORVASC Take 1 tablet (2.5 mg total) by mouth daily.   Asmanex (60 Metered Doses) 220 MCG/INH inhaler Generic drug: mometasone Inhale 2 puffs into the lungs daily.   aspirin 81 MG EC tablet Take 81 mg by mouth daily.   cloNIDine 0.1 MG tablet Commonly known as: CATAPRES Take 1 tablet (0.1 mg total) by mouth 2 (two) times daily.   Contour Next Test test strip Generic drug: glucose blood USE TO TEST ONCE DAILY   cyclobenzaprine 5 MG tablet Commonly known as: FLEXERIL Take 1 tablet (5 mg total) by mouth 3 (three) times daily as needed.   docusate sodium 100 MG capsule Commonly known as: COLACE Take 100 mg by mouth daily.   DULoxetine 60 MG capsule Commonly known as: Cymbalta Take 1 capsule (60 mg total) by mouth 2 (two) times daily. Patient needs appointment prior to future refills   estradiol 0.1 MG/GM vaginal cream Commonly known as: ESTRACE VAGINAL Apply 0.5mg  (pea-sized amount)  just inside the vaginal introitus with a finger-tip on Monday, Wednesday and Friday nights.   fluticasone 50 MCG/ACT nasal spray Commonly known as: FLONASE Place 2 sprays into both nostrils daily as needed for allergies.   gabapentin 400 MG capsule Commonly known as: Neurontin 1 capsule in the morning and at midday, 2 in the evening   hydrOXYzine 25 MG tablet Commonly known as: ATARAX/VISTARIL TAKE 1 TABLET BY MOUTH THREE TIMES DAILY AS NEEDED (FOR  SEVERE  ANXIETY  ONLY)   ketoconazole 2 % cream Commonly known as: NIZORAL Apply 1 application topically daily.   linaclotide 145 MCG Caps capsule Commonly known as: LINZESS Take 1 capsule (145 mcg total) by mouth daily before breakfast.   metFORMIN 750 MG 24 hr tablet Commonly known as: GLUCOPHAGE-XR Take 2 tablets (1,500 mg total) by mouth daily with breakfast.   metoprolol succinate 50 MG 24 hr tablet Commonly known as: TOPROL-XL Take 1 tablet (50 mg total) by mouth daily. Take with or immediately following a meal.    montelukast 10 MG tablet Commonly known as: SINGULAIR Take 1 tablet (10 mg total) by mouth at bedtime.   nitroGLYCERIN 0.4 MG SL tablet Commonly known as: NITROSTAT Place 1 tablet (0.4 mg total) under the tongue every 5 (five) minutes as needed.   olmesartan-hydrochlorothiazide 40-25 MG tablet Commonly known as: BENICAR HCT Take 1 tablet by mouth daily.   omeprazole 40 MG capsule Commonly known as: PRILOSEC Take 1 capsule (40 mg total) by mouth daily.   ONE TOUCH DELICA LANCING DEV Misc 1 each by Does not apply route daily.   OneTouch Delica Lancets 86V Misc 1 each by Does not apply route daily.   oxybutynin 15 MG 24 hr tablet Commonly known as: DITROPAN XL Take 1 tablet (15 mg total) by  mouth daily. Started by: Zara Council, PA-C   pioglitazone 15 MG tablet Commonly known as: Actos Take 1 tablet (15 mg total) by mouth daily.   polyethylene glycol 17 g packet Commonly known as: MiraLax Take 17 g by mouth daily as needed for mild constipation.   QUEtiapine 50 MG tablet Commonly known as: SEROQUEL TAKE ONE TO ONE AND ONE-HALF TABLETS BY MOUTH AT BEDTIME   rizatriptan 10 MG tablet Commonly known as: MAXALT TAKE 1 TABLET BY MOUTH THREE TIMES DAILY AS NEEDED FOR  MIGRAINE   senna 8.6 MG Tabs tablet Commonly known as: SENOKOT Take 1 tablet by mouth daily.   Synthroid 112 MCG tablet Generic drug: levothyroxine Take 1 tablet (112 mcg total) by mouth daily before breakfast.       Allergies:  Allergies  Allergen Reactions  . Latex Swelling    Pt unsure of reaction pt states something happened while in the hospital after surgery.    Family History: Family History  Problem Relation Age of Onset  . Diabetes Sister   . Anxiety disorder Sister   . Depression Sister   . Hypertension Sister   . Cirrhosis Mother   . Diabetes Mother   . Cirrhosis Father   . Breast cancer Sister 65  . Heart attack Brother   . Alcohol abuse Brother   . Drug abuse Brother   .  Prostate cancer Neg Hx   . Kidney cancer Neg Hx   . Bladder Cancer Neg Hx     Social History:  reports that she has never smoked. She has never used smokeless tobacco. She reports that she does not drink alcohol and does not use drugs.  ROS: For pertinent review of systems please refer to history of present illness  Physical Exam: BP 134/84   Pulse 92   Ht 5\' 1"  (1.549 m)   Wt 251 lb (113.9 kg)   BMI 47.43 kg/m   Constitutional:  Well nourished. Alert and oriented, No acute distress. HEENT: [place] AT, mask in place.  Trachea midline Cardiovascular: No clubbing, cyanosis, or edema. Respiratory: Normal respiratory effort, no increased work of breathing. Neurologic: Grossly intact, no focal deficits, moving all 4 extremities. Psychiatric: Normal mood and affect.   Laboratory Data: Component     Latest Ref Rng & Units 10/16/2020  Color, Urine     YELLOW YELLOW  Appearance     CLEAR CLEAR  Specific Gravity, Urine     1.005 - 1.030 >1.030 (H)  pH     5.0 - 8.0 5.5  Glucose, UA     NEGATIVE mg/dL NEGATIVE  Hgb urine dipstick     NEGATIVE TRACE (A)  Bilirubin Urine     NEGATIVE NEGATIVE  Ketones, ur     NEGATIVE mg/dL NEGATIVE  Protein     NEGATIVE mg/dL NEGATIVE  Nitrite     NEGATIVE NEGATIVE  Leukocytes,Ua     NEGATIVE SMALL (A)  Squamous Epithelial / LPF     0 - 5 11-20  WBC, UA     0 - 5 WBC/hpf 6-10  RBC / HPF     0 - 5 RBC/hpf 0-5  Bacteria, UA     NONE SEEN MANY (A)  Urine-Other      STARCH GRANULES  I have reviewed the labs  Pertinent Imaging: Results for LIANI, CARIS (MRN [id number]) as of 10/16/2020 11:05  Ref. Range 10/16/2020 10:37  Scan Result Unknown 36ml      Assessment & Plan:  1. Mixed urinary Incontinence -Myrbetriq 25 mg was covered by her insurance, but her cost was extreme and is not doable for her -We will have a trial of oxybutynin XL 15 mg daily                              2. Vaginal atrophy -Continue the vaginal estrogen  cream 3 nights weekly  3. Microscopic hematuira -Resolved with the treating of the UTI  Return in about 3 months (around 01/16/2021) for PVR and OAB questionnaire.  These notes generated with voice recognition software. I apologize for typographical errors.  Zara Council, PA-C  [facility] [address] [place], [place] [address] [phone]

## 2020-10-16 ENCOUNTER — Ambulatory Visit (INDEPENDENT_AMBULATORY_CARE_PROVIDER_SITE_OTHER): Payer: 59 | Admitting: Urology

## 2020-10-16 ENCOUNTER — Other Ambulatory Visit: Payer: Self-pay

## 2020-10-16 ENCOUNTER — Encounter: Payer: Self-pay | Admitting: Urology

## 2020-10-16 ENCOUNTER — Other Ambulatory Visit
Admission: RE | Admit: 2020-10-16 | Discharge: 2020-10-16 | Disposition: A | Discharge status: Home / Self Care | Payer: 59 | Attending: Urology | Admitting: Urology

## 2020-10-16 ENCOUNTER — Other Ambulatory Visit: Payer: Self-pay | Admitting: *Deleted

## 2020-10-16 VITALS — BP 134/84 | HR 92 | Ht 61.0 in | Wt 251.0 lb

## 2020-10-16 DIAGNOSIS — N3946 Mixed incontinence: Secondary | ICD-10-CM

## 2020-10-16 DIAGNOSIS — N952 Postmenopausal atrophic vaginitis: Secondary | ICD-10-CM

## 2020-10-16 DIAGNOSIS — R3129 Other microscopic hematuria: Secondary | ICD-10-CM

## 2020-10-16 LAB — URINALYSIS, COMPLETE (UACMP) WITH MICROSCOPIC
Bilirubin Urine: NEGATIVE
Glucose, UA: NEGATIVE mg/dL
Ketones, ur: NEGATIVE mg/dL
Nitrite: NEGATIVE
Protein, ur: NEGATIVE mg/dL
Specific Gravity, Urine: >1.03 — ABNORMAL HIGH (ref 1.005–1.030)
pH: 5.5 (ref 5.0–8.0)

## 2020-10-16 LAB — BLADDER SCAN AMB NON-IMAGING

## 2020-10-16 MED ORDER — OXYBUTYNIN CHLORIDE ER 15 MG PO TB24: 15.0000 mg | ORAL_TABLET | Freq: Every day | ORAL | 3 refills | Status: DC

## 2020-10-17 ENCOUNTER — Other Ambulatory Visit: Payer: Self-pay | Admitting: Otolaryngology

## 2020-10-17 DIAGNOSIS — E041 Nontoxic single thyroid nodule: Secondary | ICD-10-CM

## 2020-11-06 ENCOUNTER — Ambulatory Visit
Admission: RE | Admit: 2020-11-06 | Discharge: 2020-11-06 | Disposition: A | Discharge status: Home / Self Care | Payer: 59 | Source: Ambulatory Visit | Attending: Otolaryngology | Admitting: Otolaryngology

## 2020-11-06 ENCOUNTER — Other Ambulatory Visit: Payer: Self-pay

## 2020-11-06 DIAGNOSIS — E041 Nontoxic single thyroid nodule: Secondary | ICD-10-CM | POA: Diagnosis not present

## 2020-11-10 ENCOUNTER — Encounter: Payer: Self-pay | Admitting: Family Medicine

## 2020-11-16 ENCOUNTER — Other Ambulatory Visit: Payer: Self-pay | Admitting: Otolaryngology

## 2020-11-16 DIAGNOSIS — E041 Nontoxic single thyroid nodule: Secondary | ICD-10-CM

## 2020-11-20 ENCOUNTER — Ambulatory Visit: Payer: 59

## 2020-11-24 ENCOUNTER — Other Ambulatory Visit: Payer: Self-pay | Admitting: Radiology

## 2020-11-27 ENCOUNTER — Other Ambulatory Visit: Payer: Self-pay

## 2020-11-27 ENCOUNTER — Other Ambulatory Visit: Payer: Self-pay | Admitting: Otolaryngology

## 2020-11-27 ENCOUNTER — Ambulatory Visit
Admission: RE | Admit: 2020-11-27 | Discharge: 2020-11-27 | Disposition: A | Discharge status: Home / Self Care | Payer: 59 | Source: Ambulatory Visit | Attending: Otolaryngology | Admitting: Otolaryngology

## 2020-11-27 DIAGNOSIS — E041 Nontoxic single thyroid nodule: Secondary | ICD-10-CM | POA: Diagnosis present

## 2020-11-27 NOTE — Discharge Instructions (Signed)
Thyroid Needle Biopsy, Care After This sheet gives you information about how to care for yourself after your procedure. Your health care provider may also give you more specific instructions. If you have problems or questions, contact your health care provider. What can I expect after the procedure? After the procedure, it is common to have:  Soreness and tenderness that lasts for a few days.  Bruising where the needle was inserted (puncture site). Follow these instructions at home:  Take over-the-counter and prescription medicines only as told by your health care provider.  To help ease discomfort, keep your head raised (elevated) when you are lying down. When you move from lying down to sitting up, use both hands to support the back of your head and neck.  Check your puncture site every day for signs of infection. Check for: ? Redness, swelling, or pain. ? Fluid or blood. ? Warmth. ? Pus or a bad smell.  Return to your normal activities as told by your health care provider. Ask your health care provider what activities are safe for you.  Keep all follow-up visits as told by your health care provider. This is important.   Contact a health care provider if:  You have redness, swelling, or pain around your puncture site.  You have fluid or blood coming from your puncture site.  Your puncture site feels warm to the touch.  You have pus or a bad smell coming from your puncture site.  You have a fever. Get help right away if:  You have severe bleeding from the puncture site.  You have difficulty swallowing.  You have swollen glands (lymph nodes) in your neck. Summary  It is common to have some bruising and soreness where the needle was inserted in your lower front neck area (puncture site).  Check your puncture site every day for signs of infection, such as redness, swelling, or pain.  Get help right away if you have severe bleeding from your puncture site. This  information is not intended to replace advice given to you by your health care provider. Make sure you discuss any questions you have with your health care provider. Document Revised: 03/23/2020 Document Reviewed: 03/23/2020 Elsevier Patient Education  2021 Elsevier Inc.  

## 2020-12-05 ENCOUNTER — Other Ambulatory Visit: Payer: Self-pay | Admitting: Family Medicine

## 2020-12-05 DIAGNOSIS — E034 Atrophy of thyroid (acquired): Secondary | ICD-10-CM

## 2020-12-05 MED ORDER — SYNTHROID 112 MCG PO TABS: 112.0000 ug | ORAL_TABLET | Freq: Every day | ORAL | 0 refills | Status: DC

## 2020-12-12 ENCOUNTER — Ambulatory Visit
Admission: RE | Admit: 2020-12-12 | Discharge: 2020-12-12 | Disposition: A | Discharge status: Home / Self Care | Payer: 59 | Source: Ambulatory Visit | Attending: Family Medicine | Admitting: Family Medicine

## 2020-12-12 ENCOUNTER — Other Ambulatory Visit: Payer: Self-pay

## 2020-12-12 DIAGNOSIS — Z1231 Encounter for screening mammogram for malignant neoplasm of breast: Secondary | ICD-10-CM

## 2020-12-13 ENCOUNTER — Encounter: Payer: Self-pay | Admitting: Radiology

## 2020-12-13 LAB — CYTOLOGY - NON PAP

## 2020-12-15 ENCOUNTER — Ambulatory Visit (INDEPENDENT_AMBULATORY_CARE_PROVIDER_SITE_OTHER): Payer: 59 | Admitting: Family Medicine

## 2020-12-15 ENCOUNTER — Encounter: Payer: Self-pay | Admitting: Family Medicine

## 2020-12-15 ENCOUNTER — Other Ambulatory Visit: Payer: Self-pay

## 2020-12-15 VITALS — BP 136/82 | HR 86 | Temp 98.3°F | Resp 16 | Ht 61.0 in | Wt 243.0 lb

## 2020-12-15 DIAGNOSIS — F3341 Major depressive disorder, recurrent, in partial remission: Secondary | ICD-10-CM

## 2020-12-15 DIAGNOSIS — G4733 Obstructive sleep apnea (adult) (pediatric): Secondary | ICD-10-CM

## 2020-12-15 DIAGNOSIS — E785 Hyperlipidemia, unspecified: Secondary | ICD-10-CM | POA: Diagnosis not present

## 2020-12-15 DIAGNOSIS — K5909 Other constipation: Secondary | ICD-10-CM

## 2020-12-15 DIAGNOSIS — Z23 Encounter for immunization: Secondary | ICD-10-CM | POA: Diagnosis not present

## 2020-12-15 DIAGNOSIS — Z1211 Encounter for screening for malignant neoplasm of colon: Secondary | ICD-10-CM | POA: Diagnosis not present

## 2020-12-15 DIAGNOSIS — I152 Hypertension secondary to endocrine disorders: Secondary | ICD-10-CM

## 2020-12-15 DIAGNOSIS — E034 Atrophy of thyroid (acquired): Secondary | ICD-10-CM

## 2020-12-15 DIAGNOSIS — F330 Major depressive disorder, recurrent, mild: Secondary | ICD-10-CM | POA: Diagnosis not present

## 2020-12-15 DIAGNOSIS — I209 Angina pectoris, unspecified: Secondary | ICD-10-CM

## 2020-12-15 DIAGNOSIS — E1169 Type 2 diabetes mellitus with other specified complication: Secondary | ICD-10-CM

## 2020-12-15 DIAGNOSIS — Z9989 Dependence on other enabling machines and devices: Secondary | ICD-10-CM

## 2020-12-15 DIAGNOSIS — K219 Gastro-esophageal reflux disease without esophagitis: Secondary | ICD-10-CM

## 2020-12-15 DIAGNOSIS — E1159 Type 2 diabetes mellitus with other circulatory complications: Secondary | ICD-10-CM

## 2020-12-15 DIAGNOSIS — F411 Generalized anxiety disorder: Secondary | ICD-10-CM

## 2020-12-15 LAB — POCT GLYCOSYLATED HEMOGLOBIN (HGB A1C): Hemoglobin A1C: 6.9 % — AB (ref 4.0–5.6)

## 2020-12-15 MED ORDER — QUETIAPINE FUMARATE 50 MG PO TABS: ORAL_TABLET | ORAL | 0 refills | Status: DC

## 2020-12-15 MED ORDER — HYDROXYZINE HCL 25 MG PO TABS
25.0000 mg | ORAL_TABLET | Freq: Three times a day (TID) | ORAL | 0 refills | Status: DC | PRN
Start: 2020-12-15 — End: 2021-01-23

## 2020-12-15 MED ORDER — DULOXETINE HCL 60 MG PO CPEP: 60.0000 mg | ORAL_CAPSULE | Freq: Every day | ORAL | 1 refills | Status: DC

## 2020-12-15 MED ORDER — SYNTHROID 112 MCG PO TABS: 112.0000 ug | ORAL_TABLET | Freq: Every day | ORAL | 1 refills | Status: DC

## 2020-12-15 MED ORDER — ATORVASTATIN CALCIUM 40 MG PO TABS: 40.0000 mg | ORAL_TABLET | Freq: Every day | ORAL | 1 refills | Status: DC
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
## 2020-12-18 ENCOUNTER — Ambulatory Visit (INDEPENDENT_AMBULATORY_CARE_PROVIDER_SITE_OTHER): Payer: 59 | Admitting: Neurology

## 2020-12-18 ENCOUNTER — Encounter: Payer: Self-pay | Admitting: Neurology

## 2020-12-18 ENCOUNTER — Encounter: Payer: Self-pay | Admitting: *Deleted

## 2020-12-18 VITALS — BP 130/79 | HR 70 | Ht 61.0 in | Wt 243.0 lb

## 2020-12-18 DIAGNOSIS — G43019 Migraine without aura, intractable, without status migrainosus: Secondary | ICD-10-CM

## 2020-12-18 MED ORDER — EMGALITY 120 MG/ML ~~LOC~~ SOAJ: 120.0000 mg | SUBCUTANEOUS | 11 refills | Status: DC

## 2020-12-18 MED ORDER — EMGALITY 120 MG/ML ~~LOC~~ SOAJ: 240.0000 mg | Freq: Once | SUBCUTANEOUS | 0 refills | Status: AC

## 2020-12-18 MED ORDER — GABAPENTIN 400 MG PO CAPS: ORAL_CAPSULE | ORAL | 1 refills | Status: DC
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
## 2020-12-18 NOTE — Progress Notes (Signed)
I have read the note, and I agree with the clinical assessment and plan.  Jacquis Paxton K Jacquelynne Guedes   

## 2020-12-18 NOTE — Patient Instructions (Signed)
Start Emgality, initial dose is 240 mg will be 2 pens, followed 30 days later by maintenance dosing 120 mg for migraine prevention   Call me if your insurance won't cover   Continue gabapentin, cut back on OTC medications for headache treatment See you back in 6 months

## 2020-12-26 ENCOUNTER — Other Ambulatory Visit: Payer: Self-pay | Admitting: Family Medicine

## 2020-12-26 DIAGNOSIS — J4540 Moderate persistent asthma, uncomplicated: Secondary | ICD-10-CM
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
## 2021-01-03 ENCOUNTER — Telehealth: Payer: Self-pay | Admitting: *Deleted

## 2021-01-03 NOTE — Telephone Encounter (Signed)
Received Emgality PA from [employer]. Form on NP's desk for signature.

## 2021-01-08 NOTE — Telephone Encounter (Signed)
Emgality PA was signed, faxed to [employer] on 01/04/21. Received another fax with additional clinical questions. Questions answered and form faxed back to [employer].

## 2021-01-09 ENCOUNTER — Encounter: Payer: Self-pay | Admitting: *Deleted

## 2021-01-09 NOTE — Telephone Encounter (Signed)
Called [employer] to check status of emgality PA. Spoke with [place] who stated PA is still in review, decision will come via fax.  Snt my chart to update patient.

## 2021-01-09 NOTE — Telephone Encounter (Signed)
Received fax from [employer]: Emgality approved 01/09/21 - 02/07/21  for 2 mL for 30 days, loading dose. Also approved emgality with quantity limit 1 mL per 30 days 02/01/21- 07/03/21. Sent my chart to notify patient.

## 2021-01-21 NOTE — Progress Notes (Signed)
01/22/2021 10:36 AM   Beverly Barrett 1960-11-21 [id number]  Referring provider: Steele Sizer, MD [address] [place],  [place] [address]  Urological history: 1. Mixed urinary incontinence - risks factors of age, depression, obesity, DM, sleep apnea, LBP and diuretics - managed with oxybutynin IR 5 mg TID - PVR 15 mL   2. Vaginal atrophy - managed with vaginal estrogen cream   3. Renal cyst - seen on contrast CT 2021 There is a small probable benign left renal upper pole cyst.  Chief Complaint  Patient presents with   Follow-up    61mth follow-up   HPI: Beverly Barrett is a 60 y.o. female who presents today for a three month follow up for a trial of oxybutynin XK 15 mg daily.    The patient is  experiencing urgency x 0-3 (improved), frequency x 0-3 (improved), is restricting fluids to avoid visits to the restroom, is engaging in toilet mapping, incontinence x 0-3 (stable) and nocturia x 0-3 (stable).   Her BP is 119/78  Her PVR is 15 mL.    She is applying the vaginal estrogen cream three nights weekly.    She has sleep apnea and is sleeping with her CPAP.    She continues to experience severe constipation and dry mouth.    Patient denies any modifying or aggravating factors.  Patient denies any gross hematuria, dysuria or suprapubic/flank pain.  Patient denies any fevers, chills, nausea or vomiting.      PMH: Past Medical History:  Diagnosis Date   Anginal pain ([place])    none in approx 2 yrs   Anxiety    Asthma    Chronic lower back pain    Common migraine with intractable migraine 09/20/2016   Depression    suicidal ideation   Diabetes mellitus without complication (HCC)    GERD (gastroesophageal reflux disease)    Headache    migraines -3x/wk   Hyperlipidemia    Hypertension    Hypothyroidism    Motion sickness    cars   Multilevel degenerative disc disease    Shortness of breath dyspnea    Sleep apnea    CPAP   Vertigo    Wears  dentures    partial upper    Surgical History: Past Surgical History:  Procedure Laterality Date   ABDOMINAL HYSTERECTOMY     bladder tach  1995   CESAREAN SECTION     COLONOSCOPY WITH PROPOFOL N/A 05/29/2015   Procedure: COLONOSCOPY WITH PROPOFOL;  Surgeon: Lucilla Lame, MD;  Location: [facility];  Service: Endoscopy;  Laterality: N/A;  Latex allergy sleep apnea - no CPAP machine (yet)   ESOPHAGOGASTRODUODENOSCOPY N/A 04/23/2017   Procedure: ESOPHAGOGASTRODUODENOSCOPY (EGD);  Surgeon: Lin Landsman, MD;  Location: [facility];  Service: Gastroenterology;  Laterality: N/A;  Latex allergy sleep apnea   POLYPECTOMY  05/29/2015   Procedure: POLYPECTOMY;  Surgeon: Lucilla Lame, MD;  Location: [facility];  Service: Endoscopy;;    Home Medications:  Allergies as of 01/22/2021       Reactions   Latex Swelling   Pt unsure of reaction pt states something happened while in the hospital after surgery.        Medication List        Accurate as of January 22, 2021 10:36 AM. If you have any questions, ask your nurse or doctor.          acetaminophen 500 MG tablet Commonly known as: TYLENOL Take 2  tablets (1,000 mg total) by mouth every 6 (six) hours as needed.   amLODipine 2.5 MG tablet Commonly known as: NORVASC Take 1 tablet (2.5 mg total) by mouth daily.   Asmanex (60 Metered Doses) 220 MCG/INH inhaler Generic drug: mometasone Inhale 2 puffs into the lungs daily.   aspirin 81 MG EC tablet Take 81 mg by mouth daily.   atorvastatin 40 MG tablet Commonly known as: LIPITOR Take 1 tablet (40 mg total) by mouth daily.   cloNIDine 0.1 MG tablet Commonly known as: CATAPRES Take 1 tablet (0.1 mg total) by mouth 2 (two) times daily.   Contour Next Test test strip Generic drug: glucose blood USE TO TEST ONCE DAILY   cyclobenzaprine 5 MG tablet Commonly known as: FLEXERIL Take 1 tablet (5 mg total) by mouth 3 (three) times daily as needed.    docusate sodium 100 MG capsule Commonly known as: COLACE Take 100 mg by mouth daily.   DULoxetine 60 MG capsule Commonly known as: Cymbalta Take 1 capsule (60 mg total) by mouth daily. Patient needs appointment prior to future refills   Emgality 120 MG/ML Soaj Generic drug: Galcanezumab-gnlm Inject 120 mg into the skin every 30 (thirty) days.   estradiol 0.1 MG/GM vaginal cream Commonly known as: ESTRACE VAGINAL Apply 0.5mg  (pea-sized amount)  just inside the vaginal introitus with a finger-tip on Monday, Wednesday and Friday nights.   fluticasone 50 MCG/ACT nasal spray Commonly known as: FLONASE PLACE 2 SPRAYS INTO BOTH NOSTRILS DAILY AS NEEDED FOR ALLERGIES   gabapentin 400 MG capsule Commonly known as: Neurontin 1 capsule in the morning and at midday, 2 in the evening   hydrOXYzine 25 MG tablet Commonly known as: ATARAX/VISTARIL Take 1 tablet (25 mg total) by mouth every 8 (eight) hours as needed.   ketoconazole 2 % cream Commonly known as: NIZORAL Apply 1 application topically daily.   metFORMIN 750 MG 24 hr tablet Commonly known as: GLUCOPHAGE-XR Take 2 tablets (1,500 mg total) by mouth daily with breakfast.   metoprolol succinate 50 MG 24 hr tablet Commonly known as: TOPROL-XL Take 1 tablet (50 mg total) by mouth daily. Take with or immediately following a meal.   montelukast 10 MG tablet Commonly known as: SINGULAIR Take 1 tablet (10 mg total) by mouth at bedtime.   nitroGLYCERIN 0.4 MG SL tablet Commonly known as: NITROSTAT Place 1 tablet (0.4 mg total) under the tongue every 5 (five) minutes as needed.   olmesartan-hydrochlorothiazide 40-25 MG tablet Commonly known as: BENICAR HCT Take 1 tablet by mouth daily.   omeprazole 40 MG capsule Commonly known as: PRILOSEC Take 1 capsule (40 mg total) by mouth daily.   ONE TOUCH DELICA LANCING DEV Misc 1 each by Does not apply route daily.   OneTouch Delica Lancets 81L Misc 1 each by Does not apply route  daily.   oxybutynin 15 MG 24 hr tablet Commonly known as: DITROPAN XL Take 1 tablet (15 mg total) by mouth daily.   pioglitazone 15 MG tablet Commonly known as: Actos Take 1 tablet (15 mg total) by mouth daily.   polyethylene glycol 17 g packet Commonly known as: MiraLax Take 17 g by mouth daily as needed for mild constipation.   QUEtiapine 50 MG tablet Commonly known as: SEROQUEL TAKE ONE TO ONE AND ONE-HALF TABLETS BY MOUTH AT BEDTIME   rizatriptan 10 MG tablet Commonly known as: MAXALT TAKE 1 TABLET BY MOUTH THREE TIMES DAILY AS NEEDED FOR  MIGRAINE   senna 8.6 MG Tabs  tablet Commonly known as: SENOKOT Take 1 tablet by mouth daily.   Synthroid 112 MCG tablet Generic drug: levothyroxine Take 1 tablet (112 mcg total) by mouth daily before breakfast.   Ventolin HFA 108 (90 Base) MCG/ACT inhaler Generic drug: albuterol INHALE 2 PUFFS BY MOUTH EVERY 6 HOURS AS NEEDED FOR WHEEZING OR SHORTNESS OF BREATH        Allergies:  Allergies  Allergen Reactions   Latex Swelling    Pt unsure of reaction pt states something happened while in the hospital after surgery.    Family History: Family History  Problem Relation Age of Onset   Diabetes Sister    Anxiety disorder Sister    Depression Sister    Hypertension Sister    Cirrhosis Mother    Diabetes Mother    Cirrhosis Father    Breast cancer Sister 82   Heart attack Brother    Alcohol abuse Brother    Drug abuse Brother    Prostate cancer Neg Hx    Kidney cancer Neg Hx    Bladder Cancer Neg Hx     Social History:  reports that she has never smoked. She has never used smokeless tobacco. She reports that she does not drink alcohol and does not use drugs.  ROS: For pertinent review of systems please refer to history of present illness  Physical Exam: BP 119/78   Pulse 73   Ht 5\' 1"  (1.549 m)   Wt 240 lb (108.9 kg)   BMI 45.35 kg/m   Constitutional:  Well nourished. Alert and oriented, No acute  distress. HEENT: [place] AT, mask in place.  Trachea midline Cardiovascular: No clubbing, cyanosis, or edema. Respiratory: Normal respiratory effort, no increased work of breathing. Neurologic: Grossly intact, no focal deficits, moving all 4 extremities. Psychiatric: Normal mood and affect.    Laboratory Data: No new labs since last visit  Pertinent Imaging: No new imaging since last visit  Assessment & Plan:    1. Mixed urinary Incontinence -oxybutynin XL 15 mg daily - at goal, but experiencing severe dry mouth and constipation -Myrbetriq's co-pay were cost prohibitive -Gemtesa and tropsium were not covered by her insurance -will check on the coverage for PTNS explained the PTNS provides treatment by indirectly providing electrical stimulation to the nerves responsible for bladder and pelvic floor function - a needle electrode generates an adjustable electrical pulse that travels to the sacral plexus via the tibial nerve which is located in the ankle, among other functions, the sacral nerve plexus regulates bladder and pelvic floor function - treatment protocol requires once-a-week treatments for 12 weeks, 30 minutes per session and many patients begin to see improvements by the 6th treatment. Patients who respond to treatment may require occasional treatments (~ once every 3 weeks) to sustain improvements. PTNS is a low-risk procedure. The most common side-effects with PTNS treatment are temporary and minor, resulting from the placement of the needle electrode. They include minor bleeding, mild pain and skin inflammation and patients have seen up to an 80% success rate with this form of treatment -if her insurance covers PTNS, will try that to see if she can come off the oxybutynin -continue oxybutynin for now                              2. Vaginal atrophy -Continue the vaginal estrogen cream 3 nights weekly  3. Microscopic hematuira -Resolved with the treating of the UTI  Return  for  pending PTNS approval .  These notes generated with voice recognition software. I apologize for typographical errors.  Zara Council, PA-C  [facility] [address] [place], [place] [address] [phone]

## 2021-01-22 ENCOUNTER — Encounter: Payer: Self-pay | Admitting: Urology

## 2021-01-22 ENCOUNTER — Other Ambulatory Visit: Payer: Self-pay

## 2021-01-22 ENCOUNTER — Other Ambulatory Visit: Payer: Self-pay | Admitting: Family Medicine

## 2021-01-22 ENCOUNTER — Ambulatory Visit (INDEPENDENT_AMBULATORY_CARE_PROVIDER_SITE_OTHER): Payer: 59 | Admitting: Urology

## 2021-01-22 VITALS — BP 119/78 | HR 73 | Ht 61.0 in | Wt 240.0 lb

## 2021-01-22 DIAGNOSIS — N952 Postmenopausal atrophic vaginitis: Secondary | ICD-10-CM

## 2021-01-22 DIAGNOSIS — N3946 Mixed incontinence: Secondary | ICD-10-CM

## 2021-01-22 DIAGNOSIS — F411 Generalized anxiety disorder: Secondary | ICD-10-CM

## 2021-01-22 LAB — BLADDER SCAN AMB NON-IMAGING
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
## 2021-01-23 ENCOUNTER — Telehealth: Payer: Self-pay
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
## 2021-01-23 NOTE — Telephone Encounter (Signed)
Spoke with [facility] in regards to initiating a PA for PTNS. Patient will need to pay 50% of visits until patients deductible is met; $8,700. Medicaid will not pay for PTNS. Call reference # [id number]. 

## 2021-03-09 ENCOUNTER — Other Ambulatory Visit: Payer: Self-pay | Admitting: Family Medicine

## 2021-03-09 DIAGNOSIS — E1169 Type 2 diabetes mellitus with other specified complication: Secondary | ICD-10-CM

## 2021-03-09 DIAGNOSIS — K219 Gastro-esophageal reflux disease without esophagitis: Secondary | ICD-10-CM

## 2021-03-12 ENCOUNTER — Telehealth (INDEPENDENT_AMBULATORY_CARE_PROVIDER_SITE_OTHER): Payer: 59 | Admitting: Family Medicine

## 2021-03-12 ENCOUNTER — Encounter: Payer: Self-pay | Admitting: Family Medicine

## 2021-03-12 DIAGNOSIS — R112 Nausea with vomiting, unspecified: Secondary | ICD-10-CM | POA: Diagnosis not present

## 2021-03-12 DIAGNOSIS — E034 Atrophy of thyroid (acquired): Secondary | ICD-10-CM | POA: Diagnosis not present

## 2021-03-12 DIAGNOSIS — K5909 Other constipation: Secondary | ICD-10-CM

## 2021-03-12 DIAGNOSIS — R1010 Upper abdominal pain, unspecified: Secondary | ICD-10-CM

## 2021-03-12 MED ORDER — ONDANSETRON HCL 4 MG PO TABS: 4.0000 mg | ORAL_TABLET | Freq: Three times a day (TID) | ORAL | 0 refills | Status: DC | PRN
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
## 2021-03-13 LAB — CBC WITH DIFFERENTIAL/PLATELET
Absolute Monocytes: 799 cells/uL (ref 200–950)
Basophils Absolute: 94 cells/uL (ref 0–200)
Basophils Relative: 1.1 %
Eosinophils Absolute: 340 cells/uL (ref 15–500)
Eosinophils Relative: 4 %
HCT: 38.1 % (ref 35.0–45.0)
Hemoglobin: 12.9 g/dL (ref 11.7–15.5)
Lymphs Abs: 2899 cells/uL (ref 850–3900)
MCH: 28.5 pg (ref 27.0–33.0)
MCHC: 33.9 g/dL (ref 32.0–36.0)
MCV: 84.3 fL (ref 80.0–100.0)
MPV: 10.7 fL (ref 7.5–12.5)
Monocytes Relative: 9.4 %
Neutro Abs: 4369 cells/uL (ref 1500–7800)
Neutrophils Relative %: 51.4 %
Platelets: 330 10*3/uL (ref 140–400)
RBC: 4.52 10*6/uL (ref 3.80–5.10)
RDW: 13.1 % (ref 11.0–15.0)
Total Lymphocyte: 34.1 %
WBC: 8.5 10*3/uL (ref 3.8–10.8)

## 2021-03-13 LAB — COMPLETE METABOLIC PANEL WITH GFR
AG Ratio: 1.6 (calc) (ref 1.0–2.5)
ALT: 16 U/L (ref 6–29)
AST: 14 U/L (ref 10–35)
Albumin: 4.4 g/dL (ref 3.6–5.1)
Alkaline phosphatase (APISO): 53 U/L (ref 37–153)
BUN: 13 mg/dL (ref 7–25)
CO2: 32 mmol/L (ref 20–32)
Calcium: 10.1 mg/dL (ref 8.6–10.4)
Chloride: 101 mmol/L (ref 98–110)
Creat: 0.68 mg/dL (ref 0.50–1.05)
Globulin: 2.7 g/dL (calc) (ref 1.9–3.7)
Glucose, Bld: 110 mg/dL — ABNORMAL HIGH (ref 65–99)
Potassium: 4 mmol/L (ref 3.5–5.3)
Sodium: 145 mmol/L (ref 135–146)
Total Bilirubin: 0.5 mg/dL (ref 0.2–1.2)
Total Protein: 7.1 g/dL (ref 6.1–8.1)
eGFR: 99 mL/min/{1.73_m2} (ref >=60)

## 2021-03-13 LAB — AMYLASE: Amylase: 32 U/L (ref 21–101)

## 2021-03-13 LAB — LIPID PANEL
Cholesterol: 165 mg/dL (ref <200)
HDL: 40 mg/dL — ABNORMAL LOW (ref >=50)
LDL Cholesterol (Calc): 88 mg/dL (calc)
Non-HDL Cholesterol (Calc): 125 mg/dL (calc) (ref <130)
Total CHOL/HDL Ratio: 4.1 (calc) (ref <5.0)
Triglycerides: 278 mg/dL — ABNORMAL HIGH (ref <150)

## 2021-03-13 LAB — TSH: TSH: 16.03 mIU/L — ABNORMAL HIGH (ref 0.40–4.50)

## 2021-03-13 LAB — LIPASE: Lipase: 13 U/L (ref 7–60)

## 2021-03-14 ENCOUNTER — Other Ambulatory Visit: Payer: Self-pay | Admitting: Family Medicine

## 2021-03-14 DIAGNOSIS — E034 Atrophy of thyroid (acquired): Secondary | ICD-10-CM

## 2021-03-14 MED ORDER — LEVOTHYROXINE SODIUM 125 MCG PO TABS: 125.0000 ug | ORAL_TABLET | Freq: Every day | ORAL | 1 refills | Status: DC

## 2021-03-20 ENCOUNTER — Other Ambulatory Visit: Payer: Self-pay

## 2021-03-20 ENCOUNTER — Ambulatory Visit (INDEPENDENT_AMBULATORY_CARE_PROVIDER_SITE_OTHER): Payer: 59 | Admitting: Gastroenterology

## 2021-03-20 ENCOUNTER — Encounter: Payer: Self-pay | Admitting: Gastroenterology

## 2021-03-20 VITALS — BP 104/66 | HR 66 | Temp 98.9°F | Ht 61.0 in | Wt 233.2 lb

## 2021-03-20 DIAGNOSIS — K5904 Chronic idiopathic constipation: Secondary | ICD-10-CM | POA: Diagnosis not present

## 2021-03-20 DIAGNOSIS — K219 Gastro-esophageal reflux disease without esophagitis: Secondary | ICD-10-CM

## 2021-03-20 MED ORDER — GOLYTELY 236 G PO SOLR: 4000.0000 mL | Freq: Once | ORAL | 0 refills | Status: AC

## 2021-03-20 NOTE — Progress Notes (Signed)
Cephas Darby, MD [address]  [place]  [place], [place] [address]  Main: [phone]  Fax: [phone]    Gastroenterology Consultation  Referring Provider:     Steele Sizer, MD Primary Care Physician:  Steele Sizer, MD Primary Gastroenterologist:  Dr. Cephas Darby Reason for Consultation:     Chronic constipation, GERD        HPI:   Beverly Barrett is a 60 y.o. female referred by Dr. Steele Sizer, MD  for consultation & management of chronic constipation and GERD.  Patient reports that she has been experiencing severe constipation, having hard bowel movement about once a week associated with significant straining.  Scant blood on wiping.  She lost about 10 pounds intentionally.  She has been experiencing epigastric pain associated with nausea.  Patient is taking MiraLAX with no relief.  Labs revealed no evidence of anemia.  NSAIDs: None  Antiplts/Anticoagulants/Anti thrombotics: None  Patient does not smoke or drink alcohol.  She denies family history of GU malignancy  GI Procedures:  Upper endoscopy 04/23/2017 - Normal duodenal bulb and second portion of the duodenum. - A single gastric polyp. Resected and retrieved. - Erythematous mucosa in the gastric body. Biopsied. - Normal gastroesophageal junction and esophagus.    Colonoscopy 05/29/2015 - Two 2 to 3 mm polyps in the sigmoid colon. Resected and retrieved. - Diverticulosis in the entire examined colon. - Non-bleeding internal hemorrhoids. Colon, polyp(s), sigmoid - SMALL AMOUNTS OF FRAGMENTED SUPERFICIAL BENIGN COLONIC EPITHELIUM. - NO INTACT COLONIC MUCOSA OR POLYPOID ARCHITECTURE IDENTIFIED. - NO ADENOMATOUS CHANGE OR MALIGNANCY IDENTIFIED.  Past Medical History:  Diagnosis Date   Anginal pain ([place])    none in approx 2 yrs   Anxiety    Asthma    Chronic lower back pain    Common migraine with intractable migraine 09/20/2016   Depression    suicidal ideation   Diabetes mellitus  without complication (HCC)    GERD (gastroesophageal reflux disease)    Headache    migraines -3x/wk   Hyperlipidemia    Hypertension    Hypothyroidism    Motion sickness    cars   Multilevel degenerative disc disease    Shortness of breath dyspnea    Sleep apnea    CPAP   Vertigo    Wears dentures    partial upper    Past Surgical History:  Procedure Laterality Date   ABDOMINAL HYSTERECTOMY     bladder tach  1995   CESAREAN SECTION     COLONOSCOPY WITH PROPOFOL N/A 05/29/2015   Procedure: COLONOSCOPY WITH PROPOFOL;  Surgeon: Lucilla Lame, MD;  Location: [facility];  Service: Endoscopy;  Laterality: N/A;  Latex allergy sleep apnea - no CPAP machine (yet)   ESOPHAGOGASTRODUODENOSCOPY N/A 04/23/2017   Procedure: ESOPHAGOGASTRODUODENOSCOPY (EGD);  Surgeon: Lin Landsman, MD;  Location: [facility];  Service: Gastroenterology;  Laterality: N/A;  Latex allergy sleep apnea   POLYPECTOMY  05/29/2015   Procedure: POLYPECTOMY;  Surgeon: Lucilla Lame, MD;  Location: [facility];  Service: Endoscopy;;   Current Outpatient Medications:    acetaminophen (TYLENOL) 500 MG tablet, Take 2 tablets (1,000 mg total) by mouth every 6 (six) hours as needed., Disp: 30 tablet, Rfl: 0   amLODipine (NORVASC) 2.5 MG tablet, Take 1 tablet (2.5 mg total) by mouth daily., Disp: 90 tablet, Rfl: 1   aspirin 81 MG EC tablet, Take 81 mg by mouth daily., Disp: , Rfl:    atorvastatin (LIPITOR) 40  MG tablet, Take 1 tablet (40 mg total) by mouth daily., Disp: 90 tablet, Rfl: 1   cloNIDine (CATAPRES) 0.1 MG tablet, Take 1 tablet (0.1 mg total) by mouth 2 (two) times daily., Disp: 180 tablet, Rfl: 1   cyclobenzaprine (FLEXERIL) 5 MG tablet, Take 1 tablet (5 mg total) by mouth 3 (three) times daily as needed., Disp: 30 tablet, Rfl: 0   docusate sodium (COLACE) 100 MG capsule, Take 100 mg by mouth daily., Disp: , Rfl:    DULoxetine (CYMBALTA) 60 MG capsule, Take 1 capsule (60 mg total) by  mouth daily. Patient needs appointment prior to future refills, Disp: 30 capsule, Rfl: 1   estradiol (ESTRACE VAGINAL) 0.1 MG/GM vaginal cream, Apply 0.'5mg'$  (pea-sized amount)  just inside the vaginal introitus with a finger-tip on Monday, Wednesday and Friday nights., Disp: 30 g, Rfl: 12   fluticasone (FLONASE) 50 MCG/ACT nasal spray, PLACE 2 SPRAYS INTO BOTH NOSTRILS DAILY AS NEEDED FOR ALLERGIES, Disp: 48 mL, Rfl: 0   gabapentin (NEURONTIN) 400 MG capsule, 1 capsule in the morning and at midday, 2 in the evening, Disp: 360 capsule, Rfl: 1   glucose blood (CONTOUR NEXT TEST) test strip, USE TO TEST ONCE DAILY, Disp: 100 strip, Rfl: 2   hydrOXYzine (ATARAX/VISTARIL) 25 MG tablet, TAKE 1 TABLET BY MOUTH EVERY 8 HOURS AS NEEDED, Disp: 30 tablet, Rfl: 0   ketoconazole (NIZORAL) 2 % cream, Apply 1 application topically daily., Disp: 60 g, Rfl: 0   Lancet Devices (ONE TOUCH DELICA LANCING DEV) MISC, 1 each by Does not apply route daily., Disp: 1 each, Rfl: 0   levothyroxine (SYNTHROID) 125 MCG tablet, Take 1 tablet (125 mcg total) by mouth daily., Disp: 30 tablet, Rfl: 1   metFORMIN (GLUCOPHAGE-XR) 750 MG 24 hr tablet, TAKE 2 TABLETS BY MOUTH ONCE A DAY WITH BREAKFAST, Disp: 180 tablet, Rfl: 1   metoprolol succinate (TOPROL-XL) 50 MG 24 hr tablet, Take 1 tablet (50 mg total) by mouth daily. Take with or immediately following a meal., Disp: 90 tablet, Rfl: 1   mometasone (ASMANEX, 60 METERED DOSES,) 220 MCG/INH inhaler, Inhale 2 puffs into the lungs daily., Disp: 3 each, Rfl: 1   montelukast (SINGULAIR) 10 MG tablet, Take 1 tablet (10 mg total) by mouth at bedtime., Disp: 90 tablet, Rfl: 3   nitroGLYCERIN (NITROSTAT) 0.4 MG SL tablet, Place 1 tablet (0.4 mg total) under the tongue every 5 (five) minutes as needed., Disp: 25 tablet, Rfl: 0   olmesartan-hydrochlorothiazide (BENICAR HCT) 40-25 MG tablet, Take 1 tablet by mouth daily., Disp: 90 tablet, Rfl: 1   omeprazole (PRILOSEC) 40 MG capsule, TAKE 1  CAPSULE BY MOUTH ONCE A DAY, Disp: 90 capsule, Rfl: 1   ondansetron (ZOFRAN) 4 MG tablet, Take 1 tablet (4 mg total) by mouth every 8 (eight) hours as needed for nausea or vomiting., Disp: 20 tablet, Rfl: 0   OneTouch Delica Lancets 99991111 MISC, 1 each by Does not apply route daily., Disp: 100 each, Rfl: 2   oxybutynin (DITROPAN XL) 15 MG 24 hr tablet, Take 1 tablet (15 mg total) by mouth daily., Disp: 90 tablet, Rfl: 3   pioglitazone (ACTOS) 15 MG tablet, Take 1 tablet (15 mg total) by mouth daily., Disp: 90 tablet, Rfl: 1   polyethylene glycol (GOLYTELY) 236 g solution, Take 4,000 mLs by mouth once for 1 dose., Disp: 4000 mL, Rfl: 0   polyethylene glycol (MIRALAX) 17 g packet, Take 17 g by mouth daily as needed for mild constipation., Disp:  14 each, Rfl: 0   QUEtiapine (SEROQUEL) 50 MG tablet, TAKE ONE TO ONE AND ONE-HALF TABLETS BY MOUTH AT BEDTIME, Disp: 45 tablet, Rfl: 0   rizatriptan (MAXALT) 10 MG tablet, TAKE 1 TABLET BY MOUTH THREE TIMES DAILY AS NEEDED FOR  MIGRAINE, Disp: 12 tablet, Rfl: 0   senna (SENOKOT) 8.6 MG TABS tablet, Take 1 tablet by mouth daily., Disp: , Rfl:    VENTOLIN HFA 108 (90 Base) MCG/ACT inhaler, INHALE 2 PUFFS BY MOUTH EVERY 6 HOURS AS NEEDED FOR WHEEZING OR SHORTNESS OF BREATH, Disp: 2 each, Rfl: 0   Galcanezumab-gnlm (EMGALITY) 120 MG/ML SOAJ, Inject 120 mg into the skin every 30 (thirty) days. (Patient not taking: Reported on 03/20/2021), Disp: 1.12 mL, Rfl: 81    Family History  Problem Relation Age of Onset   Diabetes Sister    Anxiety disorder Sister    Depression Sister    Hypertension Sister    Cirrhosis Mother    Diabetes Mother    Cirrhosis Father    Breast cancer Sister 57   Heart attack Brother    Alcohol abuse Brother    Drug abuse Brother    Prostate cancer Neg Hx    Kidney cancer Neg Hx    Bladder Cancer Neg Hx      Social History   Tobacco Use   Smoking status: Never   Smokeless tobacco: Never  Vaping Use   Vaping Use: Never used   Substance Use Topics   Alcohol use: No    Alcohol/week: 0.0 standard drinks   Drug use: No    Allergies as of 03/20/2021 - Review Complete 03/20/2021  Allergen Reaction Noted   Latex Swelling 04/24/2015    Review of Systems:    All systems reviewed and negative except where noted in HPI.   Physical Exam:  BP 104/66 (BP Location: Left Arm, Patient Position: Sitting, Cuff Size: Large)   Pulse 66   Temp 98.9 F (37.2 C) (Oral)   Ht '5\' 1"'$  (1.549 m)   Wt 233 lb 4 oz (105.8 kg)   BMI 44.07 kg/m  No LMP recorded. Patient has had a hysterectomy.  General:   Alert,  Well-developed, well-nourished, pleasant and cooperative in NAD Head:  Normocephalic and atraumatic. Eyes:  Sclera clear, no icterus.   Conjunctiva pink. Ears:  Normal auditory acuity. Nose:  No deformity, discharge, or lesions. Mouth:  No deformity or lesions,oropharynx pink & moist. Neck:  Supple; no masses or thyromegaly. Lungs:  Respirations even and unlabored.  Clear throughout to auscultation.   No wheezes, crackles, or rhonchi. No acute distress. Heart:  Regular rate and rhythm; no murmurs, clicks, rubs, or gallops. Abdomen:  Normal bowel sounds. Soft, non-tender and non-distended without masses, hepatosplenomegaly or hernias noted.  No guarding or rebound tenderness.   Rectal: Not performed Msk:  Symmetrical without gross deformities. Good, equal movement & strength bilaterally. Pulses:  Normal pulses noted. Extremities:  No clubbing or edema.  No cyanosis. Neurologic:  Alert and oriented x3;  grossly normal neurologically. Skin:  Intact without significant lesions or rashes. No jaundice. Lymph Nodes:  No significant cervical adenopathy. Psych:  Alert and cooperative. Normal mood and affect.  Imaging Studies: No abdominal imaging  Assessment and Plan:   Beverly Barrett is a 60 y.o. pleasant African-American female with metabolic syndrome, BMI 44 is seen in consultation for chronic GERD and chronic  severe constipation  Chronic constipation Advised bowel cleanout with GoLytely Trial of Linzess 145 MCG daily, samples provided  Discussed about high-fiber diet, information provided  Chronic GERD Discussed about antireflux lifestyle Continue omeprazole 40 mg daily before breakfast  Follow up in 3 months   Cephas Darby, MD

## 2021-03-20 NOTE — Patient Instructions (Addendum)
Gave Linzess 166mg samples. If they work please let u[place]ore[place]now and we can call you in a prescription.  Take the Golytely before you start the Linzess. Fill to the fill line with Gatorade or water. Drink 8oz every 20 to 30 minutes till gone  High-Fiber Eating Plan Fiber, also called dietary fiber, is a type of carbohydrate. It is found foods such as fruits, vegetables, whole grains, and beans. A high-fiber diet can have many health benefits. Your health care provider may recommend a high-fiber diet to help: Prevent constipation. Fiber can make your bowel movements more regular. Lower your cholesterol. Relieve the following conditions: Inflammation of veins in the anus (hemorrhoids). Inflammation of specific areas of the digestive tract (uncomplicated diverticulosis). A problem of the large intestine, also called the colon, that sometimes causes pain and diarrhea (irritable bowel syndrome, or IBS). Prevent overeating as part of a weight-loss plan. Prevent heart disease, type 2 diabetes, and certain cancers. What are tips for following this plan? Reading food labels  Check the nutrition facts label on food products for the amount of dietary fiber. Choose foods that have 5 grams of fiber or more per serving. The goals for recommended daily fiber intake include: Men (age 448or younger): 34-38 g. Men (over age 61: 28-34 g. Women (age 4414or younger): 25-28 g. Women (over age 61: 22-25 g. Your daily fiber goal is _____________ g. Shopping Choose whole fruits and vegetables instead of processed forms, such as apple juice or applesauce. Choose a wide variety of high-fiber foods such as avocados, lentils, oats, and kidney beans. Read the nutrition facts label of the foods you choose. Be aware of foods with added fiber. These foods often have high sugar and sodium amounts per serving. Cooking Use whole-grain flour for baking and cooking. Cook with brown rice instead of white rice. Meal  planning Start the day with a breakfast that is high in fiber, such as a cereal that contains 5 g of fiber or more per serving. Eat breads and cereals that are made with whole-grain flour instead of refined flour or white flour. Eat brown rice, bulgur wheat, or millet instead of white rice. Use beans in place of meat in soups, salads, and pasta dishes. Be sure that half of the grains you eat each day are whole grains. General information You can get the recommended daily intake of dietary fiber by: Eating a variety of fruits, vegetables, grains, nuts, and beans. Taking a fiber supplement if you are not able to take in enough fiber in your diet. It is better to get fiber through food than from a supplement. Gradually increase how much fiber you consume. If you increase your intake of dietary fiber too quickly, you may have bloating, cramping, or gas. Drink plenty of water to help you digest fiber. Choose high-fiber snacks, such as berries, raw vegetables, nuts, and popcorn. What foods should I eat? Fruits Berries. Pears. Apples. Oranges. Avocado. Prunes and raisins. Dried figs. Vegetables Sweet potatoes. Spinach. Kale. Artichokes. Cabbage. Broccoli. Cauliflower.Green peas. Carrots. Squash. Grains Whole-grain breads. Multigrain cereal. Oats and oatmeal. Brown rice. Barley.Bulgur wheat. MIsabela Quinoa. Bran muffins. Popcorn. Rye wafer crackers. Meats and other proteins Navy beans, kidney beans, and pinto beans. Soybeans. Split peas. Lentils. Nutsand seeds. Dairy Fiber-fortified yogurt. Beverages Fiber-fortified soy milk. Fiber-fortified orange juice. Other foods Fiber bars. The items listed above may not be a complete list of recommended foods and beverages. Contact a dietitian for more information. What foods should I avoid? Fruits  Fruit juice. Cooked, strained fruit. Vegetables Fried potatoes. Canned vegetables. Well-cooked vegetables. Grains White bread. Pasta made with refined  flour. White rice. Meats and other proteins Fatty cuts of meat. Fried chicken or fried fish. Dairy Milk. Yogurt. Cream cheese. Sour cream. Fats and oils Butters. Beverages Soft drinks. Other foods Cakes and pastries. The items listed above may not be a complete list of foods and beverages to avoid. Talk with your dietitian about what choices are best for you. Summary Fiber is a type of carbohydrate. It is found in foods such as fruits, vegetables, whole grains, and beans. A high-fiber diet has many benefits. It can help to prevent constipation, lower blood cholesterol, aid weight loss, and reduce your risk of heart disease, diabetes, and certain cancers. Increase your intake of fiber gradually. Increasing fiber too quickly may cause cramping, bloating, and gas. Drink plenty of water while you increase the amount of fiber you consume. The best sources of fiber include whole fruits and vegetables, whole grains, nuts, seeds, and beans. This information is not intended to replace advice given to you by your health care provider. Make sure you discuss any questions you have with your healthcare provider. Document Revised: 11/18/2019 Document Reviewed: 11/18/2019 Elsevier Patient Education  2022 [employer].

## 2021-03-26 ENCOUNTER — Other Ambulatory Visit: Payer: Self-pay | Admitting: Family Medicine

## 2021-03-26 ENCOUNTER — Encounter: Payer: Self-pay | Admitting: Family Medicine

## 2021-03-26 DIAGNOSIS — E1169 Type 2 diabetes mellitus with other specified complication: Secondary | ICD-10-CM

## 2021-03-26 DIAGNOSIS — E034 Atrophy of thyroid (acquired): Secondary | ICD-10-CM
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
## 2021-03-28 ENCOUNTER — Ambulatory Visit (INDEPENDENT_AMBULATORY_CARE_PROVIDER_SITE_OTHER): Payer: 59 | Admitting: Family Medicine

## 2021-03-28 ENCOUNTER — Other Ambulatory Visit: Payer: Self-pay

## 2021-03-28 ENCOUNTER — Encounter: Payer: Self-pay | Admitting: Family Medicine

## 2021-03-28 VITALS — BP 120/76 | HR 87 | Temp 98.3°F | Resp 16 | Ht 61.0 in | Wt 231.0 lb

## 2021-03-28 DIAGNOSIS — Z23 Encounter for immunization: Secondary | ICD-10-CM

## 2021-03-28 DIAGNOSIS — E785 Hyperlipidemia, unspecified: Secondary | ICD-10-CM | POA: Diagnosis not present

## 2021-03-28 DIAGNOSIS — R232 Flushing: Secondary | ICD-10-CM | POA: Diagnosis not present

## 2021-03-28 DIAGNOSIS — K5909 Other constipation: Secondary | ICD-10-CM

## 2021-03-28 DIAGNOSIS — E1169 Type 2 diabetes mellitus with other specified complication: Secondary | ICD-10-CM | POA: Diagnosis not present

## 2021-03-28 DIAGNOSIS — I152 Hypertension secondary to endocrine disorders: Secondary | ICD-10-CM

## 2021-03-28 DIAGNOSIS — E1159 Type 2 diabetes mellitus with other circulatory complications: Secondary | ICD-10-CM | POA: Diagnosis not present

## 2021-03-28 DIAGNOSIS — F330 Major depressive disorder, recurrent, mild: Secondary | ICD-10-CM

## 2021-03-28 DIAGNOSIS — Z9989 Dependence on other enabling machines and devices: Secondary | ICD-10-CM

## 2021-03-28 DIAGNOSIS — I209 Angina pectoris, unspecified: Secondary | ICD-10-CM

## 2021-03-28 DIAGNOSIS — G4733 Obstructive sleep apnea (adult) (pediatric): Secondary | ICD-10-CM

## 2021-03-28 DIAGNOSIS — E034 Atrophy of thyroid (acquired): Secondary | ICD-10-CM

## 2021-03-28 DIAGNOSIS — G43009 Migraine without aura, not intractable, without status migrainosus: Secondary | ICD-10-CM

## 2021-03-28 DIAGNOSIS — J4520 Mild intermittent asthma, uncomplicated: Secondary | ICD-10-CM

## 2021-03-28 LAB — POCT GLYCOSYLATED HEMOGLOBIN (HGB A1C): Hemoglobin A1C: 6.4 % — AB (ref 4.0–5.6)

## 2021-03-28 MED ORDER — ROSUVASTATIN CALCIUM 40 MG PO TABS: 40.0000 mg | ORAL_TABLET | Freq: Every day | ORAL | 3 refills | Status: DC

## 2021-03-28 MED ORDER — PIOGLITAZONE HCL 15 MG PO TABS: 15.0000 mg | ORAL_TABLET | Freq: Every day | ORAL | 1 refills | Status: DC

## 2021-03-28 MED ORDER — GLUCOSE BLOOD VI STRP: ORAL_STRIP | 12 refills | Status: AC

## 2021-03-28 MED ORDER — METOPROLOL SUCCINATE ER 50 MG PO TB24: 50.0000 mg | ORAL_TABLET | Freq: Every day | ORAL | 1 refills | Status: DC

## 2021-03-28 MED ORDER — OLMESARTAN MEDOXOMIL-HCTZ 40-25 MG PO TABS: 1.0000 | ORAL_TABLET | Freq: Every day | ORAL | 1 refills | Status: DC

## 2021-03-28 MED ORDER — ASMANEX (60 METERED DOSES) 220 MCG/INH IN AEPB: 2.0000 | INHALATION_SPRAY | Freq: Every day | RESPIRATORY_TRACT | 1 refills | Status: DC

## 2021-03-28 MED ORDER — AMLODIPINE BESYLATE 2.5 MG PO TABS: 2.5000 mg | ORAL_TABLET | Freq: Every day | ORAL | 1 refills | Status: DC

## 2021-03-28 MED ORDER — ONETOUCH DELICA LANCETS 33G MISC: 1.0000 | Freq: Every day | 2 refills | Status: AC

## 2021-03-28 MED ORDER — CLONIDINE HCL 0.1 MG PO TABS: 0.1000 mg | ORAL_TABLET | Freq: Two times a day (BID) | ORAL | 1 refills | Status: DC

## 2021-03-29 MED ORDER — FLUTICASONE PROPIONATE HFA 44 MCG/ACT IN AERO: 2.0000 | INHALATION_SPRAY | Freq: Two times a day (BID) | RESPIRATORY_TRACT | 2 refills | Status: DC

## 2021-04-17 ENCOUNTER — Encounter: Payer: Self-pay | Admitting: Family Medicine

## 2021-04-18 ENCOUNTER — Encounter: Payer: Self-pay | Admitting: Family Medicine

## 2021-04-18 ENCOUNTER — Telehealth (INDEPENDENT_AMBULATORY_CARE_PROVIDER_SITE_OTHER): Payer: 59 | Admitting: Family Medicine

## 2021-04-18 VITALS — Ht 61.0 in | Wt 231.0 lb

## 2021-04-18 DIAGNOSIS — R110 Nausea: Secondary | ICD-10-CM

## 2021-04-18 DIAGNOSIS — R112 Nausea with vomiting, unspecified: Secondary | ICD-10-CM | POA: Diagnosis not present

## 2021-04-18 MED ORDER — ONDANSETRON HCL 4 MG PO TABS: 4.0000 mg | ORAL_TABLET | Freq: Three times a day (TID) | ORAL | 0 refills | Status: DC | PRN

## 2021-04-18 NOTE — Patient Instructions (Signed)
It was great to see you!  Our plans for today:  - Come by for COVID testing. - Take a linzess pill. Continue to take your miralax and colace. - If you are still having symptoms after having a bowel movement, come by for an in-person appointment for evaluation.  Take care and seek immediate care sooner if you develop any concerns.   Dr. Ky Barban

## 2021-04-18 NOTE — Progress Notes (Signed)
Virtual Visit via Video Note  I connected with Beverly Barrett on 04/18/21 at 11:40 AM EDT by a video enabled telemedicine application and verified that I am speaking with the correct person using two identifiers.  Location: Patient: home Provider: [facility]   I discussed the limitations of evaluation and management by telemedicine and the availability of in person appointments. The patient expressed understanding and agreed to proceed.  History of Present Illness:  ABDOMINAL ISSUES - reports intermittent nausea and diffuse abd pain since Thursday. - few episodes of vomiting - CBGs 74-200s. - h/o chronic constipation, seen by Dr. Marius Ditch 8/23 with [place] with relief. Taking linzess samples, colace, miralax. - h/o GERD on omeprazole - last BM Saturday, no blood. Soft. - sometimes jittery, headache. - no cough, congestion, runny nose - sister with COVID currently, last been around a month ago. - able to tolerate some fluids  Duration: days Petra Kuba: achy Location: diffuse  Radiation: no Frequency: intermittent Alleviating factors: dramamine Aggravating factors: certain smells Treatments attempted:  dramamine, PPI, miralax, colace Constipation: yes Diarrhea: no Heartburn: yes Bloating:no Flatulence: yes Nausea: yes Vomiting: yes, no blood Melena or hematochezia: no Rash: no Jaundice: no Fever: no Weight loss: some, 251lb in March, 257lb in January.    Observations/Objective:  Well appearing, in NAD.  Assessment and Plan:  Nausea Unclear etiology, may be multifactorial. Will test for COVID given possible exposure. Chronic constipation likely contributing, instructed to take linzess. May have hypoglycemic episode though with only one reading of 74 and the rest 100-200s, unlikely to be sole cause. Rx zofran to maintain oral hydration. If COVID negative and symptoms persistent despite bowel movement, recommend in person appt for evaluation and labs.     I discussed the  assessment and treatment plan with the patient. The patient was provided an opportunity to ask questions and all were answered. The patient agreed with the plan and demonstrated an understanding of the instructions.   The patient was advised to call back or seek an in-person evaluation if the symptoms worsen or if the condition fails to improve as anticipated.  I provided 17 minutes of non-face-to-face time during this encounter.   Myles Gip, DO

## 2021-04-19 LAB — SARS-COV-2, NAA 2 DAY TAT

## 2021-04-19 LAB — NOVEL CORONAVIRUS, NAA: SARS-CoV-2, NAA: NOT DETECTED

## 2021-05-03 ENCOUNTER — Other Ambulatory Visit: Payer: Self-pay | Admitting: Family Medicine

## 2021-05-03 DIAGNOSIS — R232 Flushing: Secondary | ICD-10-CM

## 2021-05-03 DIAGNOSIS — E1159 Type 2 diabetes mellitus with other circulatory complications: Secondary | ICD-10-CM

## 2021-05-11 ENCOUNTER — Other Ambulatory Visit: Payer: Self-pay | Admitting: Family Medicine

## 2021-05-14 ENCOUNTER — Other Ambulatory Visit: Payer: Self-pay | Admitting: Family Medicine

## 2021-05-14 DIAGNOSIS — E034 Atrophy of thyroid (acquired): Secondary | ICD-10-CM

## 2021-05-14 MED ORDER — LEVOTHYROXINE SODIUM 125 MCG PO TABS: 125.0000 ug | ORAL_TABLET | Freq: Every day | ORAL | 0 refills | Status: DC

## 2021-05-21 ENCOUNTER — Encounter: Payer: Self-pay | Admitting: Family Medicine

## 2021-05-21 ENCOUNTER — Ambulatory Visit: Payer: Self-pay | Admitting: *Deleted

## 2021-05-21 NOTE — Telephone Encounter (Signed)
[name] daughter Beverly Barrett calling, pt not present.  CAlled pt at home, daughter Beverly Barrett present as well, on DPR. Pt reports urinary burning,urgency and frequency "Off and on for 1-2 months." Reports urine cloudy, denies fever. Reports low back pain, "But that's not new."  Also reports last week BS 87 "Got it up to 121. States today 214 and 134. Reports nausea, Tele visit with Dr. Ky Barban 9/22 regarding issue, Rx Linzess; never picked up due to cost "2,000.00"States still constipated at times.  States last vomited 3 days ago. Reports unintentional weight loss, "214 now" No appetite, states is staying hydrated. Agent made appt prior to triage for 05/23/21. Noted several MyChart messages.  Assured pt NT would route to practice for PCPs review and final disposition, if needs to be seen earlier. Advised ED for worsening symptoms. Pt verbalizes understanding.    Reason for Disposition  Urinating more frequently than usual (i.e., frequency)    "Off and on for 1-2 months."  Answer Assessment - Initial Assessment Questions 1. SYMPTOM: "What's the main symptom you're concerned about?" (e.g., frequency, incontinence)     Presently urinary frequency 2. ONSET: "When did the  *No Answer*  start?"     "Off and on for 1-2 months" 3. PAIN: "Is there any pain?" If Yes, ask: "How bad is it?" (Scale: 1-10; mild, moderate, severe)     Yes burning 4. CAUSE: "What do you think is causing the symptoms?"     "MAybe UTI 5. OTHER SYMPTOMS: "Do you have any other symptoms?" (e.g., fever, flank pain, blood in urine, pain with urination)     No appetite, weight loss, constipation, nausea.  Protocols used: Urinary Symptoms-A-AH

## 2021-05-23 ENCOUNTER — Ambulatory Visit: Payer: 59 | Admitting: Family Medicine

## 2021-05-23 ENCOUNTER — Other Ambulatory Visit: Payer: Self-pay

## 2021-05-23 ENCOUNTER — Encounter: Payer: Self-pay | Admitting: Family Medicine

## 2021-05-23 ENCOUNTER — Ambulatory Visit (INDEPENDENT_AMBULATORY_CARE_PROVIDER_SITE_OTHER): Payer: 59 | Admitting: Family Medicine

## 2021-05-23 VITALS — BP 102/58 | HR 78 | Temp 98.3°F | Resp 16 | Ht 61.0 in | Wt 215.1 lb

## 2021-05-23 DIAGNOSIS — K5900 Constipation, unspecified: Secondary | ICD-10-CM | POA: Diagnosis not present

## 2021-05-23 DIAGNOSIS — R300 Dysuria: Secondary | ICD-10-CM | POA: Diagnosis not present

## 2021-05-23 LAB — POCT URINALYSIS DIPSTICK
Glucose, UA: NEGATIVE
Nitrite, UA: NEGATIVE
Protein, UA: NEGATIVE
Spec Grav, UA: 1.02 (ref 1.010–1.025)
Urobilinogen, UA: 0.2 E.U./dL
pH, UA: 5 (ref 5.0–8.0)

## 2021-05-23 MED ORDER — PEG 3350-KCL-NABCB-NACL-NASULF 236 G PO SOLR: 4000.0000 mL | Freq: Once | ORAL | 0 refills | Status: AC

## 2021-05-23 MED ORDER — TRULANCE 3 MG PO TABS: 3.0000 mg | ORAL_TABLET | Freq: Every day | ORAL | 2 refills | Status: DC

## 2021-05-23 MED ORDER — NITROFURANTOIN MONOHYD MACRO 100 MG PO CAPS: 100.0000 mg | ORAL_CAPSULE | Freq: Two times a day (BID) | ORAL | 0 refills | Status: AC

## 2021-05-23 NOTE — Patient Instructions (Signed)
It was great to see you!  Our plans for today:  - Take the antibiotics as prescribed.  - Take the golytely solution as prescribed. Mix with water or gatorade, drink 8 oz every 30 minutes until gone. - continue to take miralax every day.  - Increase your fiber intake.  - Follow up with Dr. Marius Ditch as scheduled.   We are checking some labs today, we will release these results to your MyChart.  Take care and seek immediate care sooner if you develop any concerns.   Dr. Ky Barban  High-Fiber Eating Plan Fiber, also called dietary fiber, is a type of carbohydrate. It is found foods such as fruits, vegetables, whole grains, and beans. A high-fiber diet can have many health benefits. Your health care provider may recommend a high-fiber diet to help: Prevent constipation. Fiber can make your bowel movements more regular. Lower your cholesterol. Relieve the following conditions: Inflammation of veins in the anus (hemorrhoids). Inflammation of specific areas of the digestive tract (uncomplicated diverticulosis). A problem of the large intestine, also called the colon, that sometimes causes pain and diarrhea (irritable bowel syndrome, or IBS). Prevent overeating as part of a weight-loss plan. Prevent heart disease, type 2 diabetes, and certain cancers. What are tips for following this plan? Reading food labels  Check the nutrition facts label on food products for the amount of dietary fiber. Choose foods that have 5 grams of fiber or more per serving. The goals for recommended daily fiber intake include: Men (age 20 or younger): 34-38 g. Men (over age 49): 28-34 g. Women (age 53 or younger): 25-28 g. Women (over age 66): 22-25 g. Your daily fiber goal is _____________ g. Shopping Choose whole fruits and vegetables instead of processed forms, such as apple juice or applesauce. Choose a wide variety of high-fiber foods such as avocados, lentils, oats, and kidney beans. Read the nutrition facts  label of the foods you choose. Be aware of foods with added fiber. These foods often have high sugar and sodium amounts per serving. Cooking Use whole-grain flour for baking and cooking. Cook with brown rice instead of white rice. Meal planning Start the day with a breakfast that is high in fiber, such as a cereal that contains 5 g of fiber or more per serving. Eat breads and cereals that are made with whole-grain flour instead of refined flour or white flour. Eat brown rice, bulgur wheat, or millet instead of white rice. Use beans in place of meat in soups, salads, and pasta dishes. Be sure that half of the grains you eat each day are whole grains. General information You can get the recommended daily intake of dietary fiber by: Eating a variety of fruits, vegetables, grains, nuts, and beans. Taking a fiber supplement if you are not able to take in enough fiber in your diet. It is better to get fiber through food than from a supplement. Gradually increase how much fiber you consume. If you increase your intake of dietary fiber too quickly, you may have bloating, cramping, or gas. Drink plenty of water to help you digest fiber. Choose high-fiber snacks, such as berries, raw vegetables, nuts, and popcorn. What foods should I eat? Fruits Berries. Pears. Apples. Oranges. Avocado. Prunes and raisins. Dried figs. Vegetables Sweet potatoes. Spinach. Kale. Artichokes. Cabbage. Broccoli. Cauliflower. Green peas. Carrots. Squash. Grains Whole-grain breads. Multigrain cereal. Oats and oatmeal. Brown rice. Barley. Bulgur wheat. [place]. Quinoa. Bran muffins. Popcorn. Rye wafer crackers. Meats and other proteins Navy beans, kidney beans,  and pinto beans. Soybeans. Split peas. Lentils. Nuts and seeds. Dairy Fiber-fortified yogurt. Beverages Fiber-fortified soy milk. Fiber-fortified orange juice. Other foods Fiber bars. The items listed above may not be a complete list of recommended foods and  beverages. Contact a dietitian for more information. What foods should I avoid? Fruits Fruit juice. Cooked, strained fruit. Vegetables Fried potatoes. Canned vegetables. Well-cooked vegetables. Grains White bread. Pasta made with refined flour. White rice. Meats and other proteins Fatty cuts of meat. Fried chicken or fried fish. Dairy Milk. Yogurt. Cream cheese. Sour cream. Fats and oils Butters. Beverages Soft drinks. Other foods Cakes and pastries. The items listed above may not be a complete list of foods and beverages to avoid. Talk with your dietitian about what choices are best for you. Summary Fiber is a type of carbohydrate. It is found in foods such as fruits, vegetables, whole grains, and beans. A high-fiber diet has many benefits. It can help to prevent constipation, lower blood cholesterol, aid weight loss, and reduce your risk of heart disease, diabetes, and certain cancers. Increase your intake of fiber gradually. Increasing fiber too quickly may cause cramping, bloating, and gas. Drink plenty of water while you increase the amount of fiber you consume. The best sources of fiber include whole fruits and vegetables, whole grains, nuts, seeds, and beans. This information is not intended to replace advice given to you by your health care provider. Make sure you discuss any questions you have with your health care provider. Document Revised: 11/18/2019 Document Reviewed: 11/18/2019 Elsevier Patient Education  2022 [employer].

## 2021-05-23 NOTE — Progress Notes (Signed)
    SUBJECTIVE:   CHIEF COMPLAINT / HPI:   URINARY SYMPTOMS Duration: 1 week Dysuria: burning Urinary frequency: yes Urgency: no Small volume voids: yes Urinary incontinence: yes Foul odor: yes Hematuria: no Abdominal pain: yes Back pain: yes, not new Suprapubic pain/pressure: yes Flank pain: yes Fever:  no Vomiting: yes, last 3 days ago Relief with cranberry juice:  hasn't tried Relief with pyridium: no Status: better/worse/stable Previous urinary tract infection: yes Recurrent urinary tract infection: no Sexual activity: No sexually active Vaginal discharge: no Treatments attempted: pyridium   Abdominal pain - previously seen by Dr. Marius Ditch, last appt 8/23. Thought 2/2 chronic constipation, had [place] with relief. Last BM 3 weeks ago. Taking 1 capful of miralax BID. Drinks 3-4 16oz bottles of water per day, occasional coffee. Couldn't afford linzess. Previously been on trulance.    OBJECTIVE:   BP (!) 102/58   Pulse 78   Temp 98.3 F (36.8 C) (Oral)   Resp 16   Ht 5\' 1"  (1.549 m)   Wt 215 lb 1.6 oz (97.6 kg)   SpO2 96%   BMI 40.64 kg/m   Gen: well appearing, in NAD Card: RRR Lungs: CTAB Abd: soft, TTP diffusely more in suprapubic area, hypoactive BS Ext: WWP, no edema   ASSESSMENT/PLAN:   CN (constipation) Golytely cleanout followed by daily miralax. Trial of trulance, sample given, couldn't afford linzess. F/u with GI as scheduled.   Dysuria UA consistent with UTI, macrobid sent. Will send urine for culture and adjust as indicated.    Myles Gip, DO

## 2021-05-23 NOTE — Telephone Encounter (Signed)
Lvm to inform pt to be sure to have daughter come with her to her appt

## 2021-05-23 NOTE — Assessment & Plan Note (Addendum)
Golytely cleanout followed by daily miralax. Trial of trulance, sample given, couldn't afford linzess. F/u with GI as scheduled.

## 2021-05-24 LAB — URINE CULTURE
MICRO NUMBER:: 12555581
SPECIMEN QUALITY:: ADEQUATE

## 2021-06-11 ENCOUNTER — Encounter: Payer: Self-pay | Admitting: Gastroenterology

## 2021-06-11 ENCOUNTER — Other Ambulatory Visit: Payer: Self-pay

## 2021-06-11 MED ORDER — LINACLOTIDE 145 MCG PO CAPS: 145.0000 ug | ORAL_CAPSULE | Freq: Every day | ORAL | 3 refills | Status: DC

## 2021-06-11 NOTE — Progress Notes (Signed)
Sent in prescription for linzess 145 as requested

## 2021-06-27 ENCOUNTER — Telehealth: Payer: Self-pay

## 2021-06-27 ENCOUNTER — Ambulatory Visit (INDEPENDENT_AMBULATORY_CARE_PROVIDER_SITE_OTHER): Payer: 59 | Admitting: Gastroenterology

## 2021-06-27 ENCOUNTER — Encounter: Payer: Self-pay | Admitting: Gastroenterology

## 2021-06-27 VITALS — BP 153/97 | HR 66 | Temp 99.0°F | Ht 61.0 in | Wt 224.4 lb

## 2021-06-27 DIAGNOSIS — R110 Nausea: Secondary | ICD-10-CM

## 2021-06-27 DIAGNOSIS — R112 Nausea with vomiting, unspecified: Secondary | ICD-10-CM | POA: Diagnosis not present

## 2021-06-27 DIAGNOSIS — K5904 Chronic idiopathic constipation: Secondary | ICD-10-CM | POA: Diagnosis not present

## 2021-06-27 DIAGNOSIS — K219 Gastro-esophageal reflux disease without esophagitis: Secondary | ICD-10-CM | POA: Diagnosis not present

## 2021-06-27 NOTE — Progress Notes (Signed)
Cephas Darby, MD [address]  [place]  [place], [place] [address]  Main: [phone]  Fax: [phone]    Gastroenterology Consultation  Referring Provider:     Steele Sizer, MD Primary Care Physician:  Steele Sizer, MD Primary Gastroenterologist:  Dr. Cephas Darby Reason for Consultation:     Chronic constipation, GERD        HPI:   Beverly Barrett is a 60 y.o. female referred by Dr. Steele Sizer, MD  for consultation & management of chronic constipation and GERD.  Patient reports that she has been experiencing severe constipation, having hard bowel movement about once a week associated with significant straining.  Scant blood on wiping.  She lost about 10 pounds intentionally.  She has been experiencing epigastric pain associated with nausea.  Patient is taking MiraLAX with no relief.  Labs revealed no evidence of anemia.  Follow-up visit 06/27/2021 Patient is here for follow-up of constipation and GERD.  She tried Linzess 145 MCG samples which helped, currently on prescription.  Reports having bowel movements daily.  She is concerned about nausea occurring on a daily basis not associated with vomiting.  She takes omeprazole 40 mg daily for chronic GERD.  Patient is gaining weight.  Patient is accompanied by her daughter today.  NSAIDs: None  Antiplts/Anticoagulants/Anti thrombotics: None  Patient does not smoke or drink alcohol.  She denies family history of GU malignancy  GI Procedures:  Upper endoscopy 04/23/2017 - Normal duodenal bulb and second portion of the duodenum. - A single gastric polyp. Resected and retrieved. - Erythematous mucosa in the gastric body. Biopsied. - Normal gastroesophageal junction and esophagus.    Colonoscopy 05/29/2015 - Two 2 to 3 mm polyps in the sigmoid colon. Resected and retrieved. - Diverticulosis in the entire examined colon. - Non-bleeding internal hemorrhoids. Colon, polyp(s), sigmoid - SMALL AMOUNTS OF  FRAGMENTED SUPERFICIAL BENIGN COLONIC EPITHELIUM. - NO INTACT COLONIC MUCOSA OR POLYPOID ARCHITECTURE IDENTIFIED. - NO ADENOMATOUS CHANGE OR MALIGNANCY IDENTIFIED.  Past Medical History:  Diagnosis Date   Anginal pain ([place])    none in approx 2 yrs   Anxiety    Asthma    Chronic lower back pain    Common migraine with intractable migraine 09/20/2016   Depression    suicidal ideation   Diabetes mellitus without complication (HCC)    GERD (gastroesophageal reflux disease)    Headache    migraines -3x/wk   Hyperlipidemia    Hypertension    Hypothyroidism    Motion sickness    cars   Multilevel degenerative disc disease    Shortness of breath dyspnea    Sleep apnea    CPAP   Vertigo    Wears dentures    partial upper    Past Surgical History:  Procedure Laterality Date   ABDOMINAL HYSTERECTOMY     bladder tach  1995   CESAREAN SECTION     COLONOSCOPY WITH PROPOFOL N/A 05/29/2015   Procedure: COLONOSCOPY WITH PROPOFOL;  Surgeon: Lucilla Lame, MD;  Location: [facility];  Service: Endoscopy;  Laterality: N/A;  Latex allergy sleep apnea - no CPAP machine (yet)   ESOPHAGOGASTRODUODENOSCOPY N/A 04/23/2017   Procedure: ESOPHAGOGASTRODUODENOSCOPY (EGD);  Surgeon: Lin Landsman, MD;  Location: [place] [facility] [place];  Service: Gastroenterology;  Laterality: N/A;  Latex allergy sleep apnea   POLYPECTOMY  05/29/2015   Procedure: POLYPECTOMY;  Surgeon: Lucilla Lame, MD;  Location: [place] [facility];  Service: Endoscopy;;   Current Outpatient Medications:  acetaminophen (TYLENOL) 500 MG tablet, Take 2 tablets (1,000 mg total) by mouth every 6 (six) hours as needed., Disp: 30 tablet, Rfl: 0   amLODipine (NORVASC) 2.5 MG tablet, TAKE 1 TABLET BY MOUTH ONCE A DAY, Disp: 90 tablet, Rfl: 1   aspirin 81 MG EC tablet, Take 81 mg by mouth daily., Disp: , Rfl:    cloNIDine (CATAPRES) 0.1 MG tablet, TAKE 1 TABLET BY MOUTH TWICE A DAY, Disp: 180 tablet, Rfl: 1   docusate sodium  (COLACE) 100 MG capsule, Take 100 mg by mouth daily., Disp: , Rfl:    estradiol (ESTRACE VAGINAL) 0.1 MG/GM vaginal cream, Apply 0.5mg  (pea-sized amount)  just inside the vaginal introitus with a finger-tip on Monday, Wednesday and Friday nights., Disp: 30 g, Rfl: 12   fluticasone (FLONASE) 50 MCG/ACT nasal spray, PLACE 2 SPRAYS INTO BOTH NOSTRILS DAILY AS NEEDED FOR ALLERGIES, Disp: 48 mL, Rfl: 0   fluticasone (FLOVENT HFA) 44 MCG/ACT inhaler, Inhale 2 puffs into the lungs 2 (two) times daily. In place of Asmanex, Disp: 1 each, Rfl: 2   gabapentin (NEURONTIN) 400 MG capsule, 1 capsule in the morning and at midday, 2 in the evening, Disp: 360 capsule, Rfl: 1   glucose blood test strip, Use as instructed, Disp: 100 each, Rfl: 12   hydrOXYzine (ATARAX/VISTARIL) 25 MG tablet, TAKE 1 TABLET BY MOUTH EVERY 8 HOURS AS NEEDED, Disp: 30 tablet, Rfl: 0   ketoconazole (NIZORAL) 2 % cream, Apply 1 application topically daily., Disp: 60 g, Rfl: 0   Lancet Devices (ONE TOUCH DELICA LANCING DEV) MISC, 1 each by Does not apply route daily., Disp: 1 each, Rfl: 0   levothyroxine (SYNTHROID) 125 MCG tablet, Take 1 tablet (125 mcg total) by mouth daily., Disp: 30 tablet, Rfl: 0   metFORMIN (GLUCOPHAGE-XR) 750 MG 24 hr tablet, TAKE 2 TABLETS BY MOUTH ONCE A DAY WITH BREAKFAST, Disp: 180 tablet, Rfl: 1   metoprolol succinate (TOPROL-XL) 50 MG 24 hr tablet, TAKE 1 TABLET BY MOUTH ONCE A DAY WITH OR IMMEDIATELY FOLLOWING A MEAL., Disp: 90 tablet, Rfl: 1   montelukast (SINGULAIR) 10 MG tablet, Take 1 tablet (10 mg total) by mouth at bedtime., Disp: 90 tablet, Rfl: 3   nitroGLYCERIN (NITROSTAT) 0.4 MG SL tablet, Place 1 tablet (0.4 mg total) under the tongue every 5 (five) minutes as needed., Disp: 25 tablet, Rfl: 0   olmesartan-hydrochlorothiazide (BENICAR HCT) 40-25 MG tablet, Take 1 tablet by mouth daily., Disp: 90 tablet, Rfl: 1   omeprazole (PRILOSEC) 40 MG capsule, TAKE 1 CAPSULE BY MOUTH ONCE A DAY, Disp: 90 capsule,  Rfl: 1   OneTouch Delica Lancets 63J MISC, 1 each by Does not apply route daily., Disp: 100 each, Rfl: 2   oxybutynin (DITROPAN XL) 15 MG 24 hr tablet, Take 1 tablet (15 mg total) by mouth daily., Disp: 90 tablet, Rfl: 3   pioglitazone (ACTOS) 15 MG tablet, Take 1 tablet (15 mg total) by mouth daily., Disp: 90 tablet, Rfl: 1   polyethylene glycol (MIRALAX) 17 g packet, Take 17 g by mouth daily as needed for mild constipation., Disp: 14 each, Rfl: 0   QUEtiapine (SEROQUEL) 50 MG tablet, TAKE ONE TO ONE AND ONE-HALF TABLETS BY MOUTH AT BEDTIME, Disp: 45 tablet, Rfl: 0   rizatriptan (MAXALT) 10 MG tablet, TAKE 1 TABLET BY MOUTH THREE TIMES DAILY AS NEEDED FOR  MIGRAINE, Disp: 24 tablet, Rfl: 0   rosuvastatin (CRESTOR) 40 MG tablet, Take 1 tablet (40 mg total) by mouth  daily. In place of Atorvastatin, Disp: 90 tablet, Rfl: 3   senna (SENOKOT) 8.6 MG TABS tablet, Take 1 tablet by mouth daily., Disp: , Rfl:    VENTOLIN HFA 108 (90 Base) MCG/ACT inhaler, INHALE 2 PUFFS BY MOUTH EVERY 6 HOURS AS NEEDED FOR WHEEZING OR SHORTNESS OF BREATH, Disp: 2 each, Rfl: 0   LINZESS 145 MCG CAPS capsule, Take 145 mcg by mouth daily., Disp: , Rfl:    ondansetron (ZOFRAN) 4 MG tablet, Take 1 tablet (4 mg total) by mouth every 8 (eight) hours as needed for nausea or vomiting. (Patient not taking: Reported on 05/23/2021), Disp: 15 tablet, Rfl: 0    Family History  Problem Relation Age of Onset   Diabetes Sister    Anxiety disorder Sister    Depression Sister    Hypertension Sister    Cirrhosis Mother    Diabetes Mother    Cirrhosis Father    Breast cancer Sister 2   Heart attack Brother    Alcohol abuse Brother    Drug abuse Brother    Prostate cancer Neg Hx    Kidney cancer Neg Hx    Bladder Cancer Neg Hx      Social History   Tobacco Use   Smoking status: Never   Smokeless tobacco: Never  Vaping Use   Vaping Use: Never used  Substance Use Topics   Alcohol use: No    Alcohol/week: 0.0 standard  drinks   Drug use: No    Allergies as of 06/27/2021 - Review Complete 06/27/2021  Allergen Reaction Noted   Latex Swelling 04/24/2015    Review of Systems:    All systems reviewed and negative except where noted in HPI.   Physical Exam:  BP (!) 153/97 (BP Location: Left Arm, Patient Position: Sitting, Cuff Size: Large)   Pulse 66   Temp 99 F (37.2 C) (Oral)   Ht 5\' 1"  (1.549 m)   Wt 224 lb 6.4 oz (101.8 kg)   BMI 42.40 kg/m  No LMP recorded. Patient has had a hysterectomy.  General:   Alert,  Well-developed, well-nourished, pleasant and cooperative in NAD Head:  Normocephalic and atraumatic. Eyes:  Sclera clear, no icterus.   Conjunctiva pink. Ears:  Normal auditory acuity. Nose:  No deformity, discharge, or lesions. Mouth:  No deformity or lesions,oropharynx pink & moist. Neck:  Supple; no masses or thyromegaly. Lungs:  Respirations even and unlabored.  Clear throughout to auscultation.   No wheezes, crackles, or rhonchi. No acute distress. Heart:  Regular rate and rhythm; no murmurs, clicks, rubs, or gallops. Abdomen:  Normal bowel sounds. Soft, non-tender and non-distended without masses, hepatosplenomegaly or hernias noted.  No guarding or rebound tenderness.   Rectal: Not performed Msk:  Symmetrical without gross deformities. Good, equal movement & strength bilaterally. Pulses:  Normal pulses noted. Extremities:  No clubbing or edema.  No cyanosis. Neurologic:  Alert and oriented x3;  grossly normal neurologically. Skin:  Intact without significant lesions or rashes. No jaundice. Lymph Nodes:  No significant cervical adenopathy. Psych:  Alert and cooperative. Normal mood and affect.  Imaging Studies: No abdominal imaging  Assessment and Plan:   Charday Capetillo is a 60 y.o. pleasant African-American female with metabolic syndrome, BMI 44 is seen in for follow-up of chronic GERD and chronic severe constipation  Chronic constipation Continue Linzess 145 MCG  daily Reiterated on high-fiber diet and adequate intake of water  Persistent nausea with vomiting Given her history of diabetes, recommend gastric emptying study  Patient is currently taking Dramamine as needed, continue the same  Chronic GERD Discussed about antireflux lifestyle Continue omeprazole 40 mg daily before breakfast  Follow up in 4 months   Cephas Darby, MD

## 2021-06-27 NOTE — Telephone Encounter (Signed)
Called endo they will call me back in the morning to schedule called patient to let her know as well

## 2021-06-27 NOTE — Addendum Note (Signed)
Addended by: Eliseo Squires on: 06/27/2021 04:16 PM   Modules accepted: Orders

## 2021-06-27 NOTE — Telephone Encounter (Signed)
error 

## 2021-06-28 ENCOUNTER — Telehealth: Payer: Self-pay

## 2021-06-28 NOTE — Telephone Encounter (Signed)
Spoke with patient she understands instructions and time of

## 2021-06-28 NOTE — Telephone Encounter (Signed)
Called patient no answer left voicemail for a call back she is now scheduled for gastric emptying study for dec 16 at 830 dont take gi meds 8 hours prior and npo after midnight the night before

## 2021-06-28 NOTE — Telephone Encounter (Signed)
Patient is scheduled for dec 16 at 830 arrival time npo after midnight and dont take gi meds 8 hours prior at [place]

## 2021-07-02 ENCOUNTER — Ambulatory Visit: Payer: 59 | Admitting: Neurology

## 2021-07-04 ENCOUNTER — Other Ambulatory Visit: Payer: Self-pay

## 2021-07-04 ENCOUNTER — Ambulatory Visit (INDEPENDENT_AMBULATORY_CARE_PROVIDER_SITE_OTHER): Payer: 59 | Admitting: Neurology

## 2021-07-04 ENCOUNTER — Encounter: Payer: Self-pay | Admitting: Neurology

## 2021-07-04 ENCOUNTER — Telehealth: Payer: Self-pay | Admitting: *Deleted

## 2021-07-04 VITALS — BP 145/85 | HR 72 | Ht 61.0 in | Wt 217.0 lb

## 2021-07-04 DIAGNOSIS — G43019 Migraine without aura, intractable, without status migrainosus: Secondary | ICD-10-CM | POA: Diagnosis not present

## 2021-07-04 MED ORDER — EMGALITY 120 MG/ML ~~LOC~~ SOAJ: 240.0000 mg | Freq: Once | SUBCUTANEOUS | 0 refills | Status: AC

## 2021-07-04 MED ORDER — GABAPENTIN 400 MG PO CAPS: ORAL_CAPSULE | ORAL | 1 refills | Status: DC

## 2021-07-04 MED ORDER — RIZATRIPTAN BENZOATE 10 MG PO TABS: ORAL_TABLET | ORAL | 0 refills | Status: DC

## 2021-07-04 MED ORDER — EMGALITY 120 MG/ML ~~LOC~~ SOAJ: 120.0000 mg | SUBCUTANEOUS | 11 refills | Status: DC
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
## 2021-07-04 NOTE — Patient Instructions (Addendum)
Please start the Emgality, loading dose injection of 240 mg followed 30 days later by the maintenance dose of 120 mg once a month for headache prevention   Continue gabapentin, take Maxalt as needed for acute headache, avoid BC powders   Call me if you can't get the Emgality injection,may consider Botox for headache prevention   See you back in 6 months

## 2021-07-04 NOTE — Telephone Encounter (Signed)
Unable to complete PA for Emgality through covermymeds.   Pt has coverage through [place] (MP#[id number]). I called the plan at [phone]. Decision pending.

## 2021-07-09 NOTE — Telephone Encounter (Signed)
Received PA approval letter from [place]. Emgality 76mL starter dose approved through 08/04/21 and the 82mL maintenance dose is approved 07/28/21-12/25/21.

## 2021-07-09 NOTE — Telephone Encounter (Signed)
[id number] approved for loading dose for 30 days.  [id number] approved for maintenance dose approved through 12/25/2021.

## 2021-07-13 ENCOUNTER — Encounter
Admission: RE | Admit: 2021-07-13 | Discharge: 2021-07-13 | Disposition: A | Discharge status: Home / Self Care | Payer: 59 | Source: Ambulatory Visit | Attending: Gastroenterology | Admitting: Gastroenterology

## 2021-07-13 ENCOUNTER — Other Ambulatory Visit: Payer: Self-pay

## 2021-07-13 DIAGNOSIS — R110 Nausea: Secondary | ICD-10-CM | POA: Insufficient documentation

## 2021-07-13 DIAGNOSIS — R112 Nausea with vomiting, unspecified: Secondary | ICD-10-CM | POA: Insufficient documentation

## 2021-07-13 MED ORDER — TECHNETIUM TC 99M SULFUR COLLOID
2.0000 | Freq: Once | INTRAVENOUS | Status: AC | PRN
  Administered 2021-07-13: 1.95 via ORAL

## 2021-07-18 ENCOUNTER — Telehealth: Payer: Self-pay

## 2021-07-18 NOTE — Telephone Encounter (Signed)
-----   Message from Lin Landsman, MD sent at 07/16/2021 11:52 AM EST ----- Please inform patient that her gastric emptying study came back normal  RV

## 2021-07-18 NOTE — Telephone Encounter (Signed)
CALLED PATIENT NO ANSWER LEFT VOICEMAIL FOR A CALL BACK ? ?

## 2021-07-19 ENCOUNTER — Other Ambulatory Visit: Payer: Self-pay

## 2021-07-19 ENCOUNTER — Encounter: Payer: Self-pay | Admitting: Psychiatry

## 2021-07-19 ENCOUNTER — Telehealth: Payer: Self-pay

## 2021-07-19 ENCOUNTER — Ambulatory Visit (INDEPENDENT_AMBULATORY_CARE_PROVIDER_SITE_OTHER): Payer: 59 | Admitting: Psychiatry

## 2021-07-19 VITALS — BP 144/82 | HR 87 | Temp 98.5°F | Ht 61.25 in | Wt 219.0 lb

## 2021-07-19 DIAGNOSIS — R030 Elevated blood-pressure reading, without diagnosis of hypertension: Secondary | ICD-10-CM | POA: Insufficient documentation

## 2021-07-19 DIAGNOSIS — F411 Generalized anxiety disorder: Secondary | ICD-10-CM

## 2021-07-19 DIAGNOSIS — G479 Sleep disorder, unspecified: Secondary | ICD-10-CM | POA: Diagnosis not present

## 2021-07-19 DIAGNOSIS — F330 Major depressive disorder, recurrent, mild: Secondary | ICD-10-CM | POA: Diagnosis not present

## 2021-07-19 DIAGNOSIS — R7989 Other specified abnormal findings of blood chemistry: Secondary | ICD-10-CM | POA: Diagnosis not present

## 2021-07-19 MED ORDER — MIRTAZAPINE 7.5 MG PO TABS: 7.5000 mg | ORAL_TABLET | Freq: Every day | ORAL | 1 refills | Status: DC

## 2021-07-19 NOTE — Patient Instructions (Signed)
Mirtazapine Tablets What is this medication? MIRTAZAPINE (mir TAZ a peen) treats depression. It increases the amount of serotonin and norepinephrine in the brain, hormones that help regulate mood. This medicine may be used for other purposes; ask your health care provider or pharmacist if you have questions. COMMON BRAND NAME(S): Remeron What should I tell my care team before I take this medication? They need to know if you have any of these conditions: Bipolar disorder Glaucoma Kidney disease Liver disease Suicidal thoughts An unusual or allergic reaction to mirtazapine, other medications, foods, dyes, or preservatives Pregnant or trying to get pregnant Breast-feeding How should I use this medication? Take this medication by mouth with a glass of water. Follow the directions on the prescription label. Take your medication at regular intervals. Do not take your medication more often than directed. Do not stop taking this medication suddenly except upon the advice of your care team. Stopping this medication too quickly may cause serious side effects or your condition may worsen. A special MedGuide will be given to you by the pharmacist with each prescription and refill. Be sure to read this information carefully each time. Talk to your care team about the use of this medication in children. Special care may be needed. Overdosage: If you think you have taken too much of this medicine contact a poison control center or emergency room at once. NOTE: This medicine is only for you. Do not share this medicine with others. What if I miss a dose? If you miss a dose, take it as soon as you can. If it is almost time for your next dose, take only that dose. Do not take double or extra doses. What may interact with this medication? Do not take this medication with any of the following: Linezolid MAOIs like Carbex, Eldepryl, Marplan, Nardil, and Parnate Methylene blue (injected into a vein) This  medication may also interact with the following: Alcohol Antiviral medications for HIV or AIDS Certain medications that treat or prevent blood clots like warfarin Certain medications for depression, anxiety, or psychotic disturbances Certain medications for fungal infections like ketoconazole and itraconazole Certain medications for migraine headache like almotriptan, eletriptan, frovatriptan, naratriptan, rizatriptan, sumatriptan, zolmitriptan Certain medications for seizures like carbamazepine or phenytoin Certain medications for sleep Cimetidine Erythromycin Fentanyl Lithium Medications for blood pressure Nefazodone Rasagiline Rifampin Supplements like St. John's wort, kava kava, valerian Tramadol Tryptophan This list may not describe all possible interactions. Give your health care provider a list of all the medicines, herbs, non-prescription drugs, or dietary supplements you use. Also tell them if you smoke, drink alcohol, or use illegal drugs. Some items may interact with your medicine. What should I watch for while using this medication? Tell your care team if your symptoms do not get better or if they get worse. Visit your care team for regular checks on your progress. Because it may take several weeks to see the full effects of this medication, it is important to continue your treatment as prescribed by your doctor. Patients and their families should watch out for new or worsening thoughts of suicide or depression. Also watch out for sudden changes in feelings such as feeling anxious, agitated, panicky, irritable, hostile, aggressive, impulsive, severely restless, overly excited and hyperactive, or not being able to sleep. If this happens, especially at the beginning of treatment or after a change in dose, call your care team. You may get drowsy or dizzy. Do not drive, use machinery, or do anything that needs mental alertness until  you know how this medication affects you. Do not  stand or sit up quickly, especially if you are an older patient. This reduces the risk of dizzy or fainting spells. Alcohol may interfere with the effect of this medication. Avoid alcoholic drinks. This medication may cause dry eyes and blurred vision. If you wear contact lenses you may feel some discomfort. Lubricating drops may help. See your eye care team if the problem does not go away or is severe. Your mouth may get dry. Chewing sugarless gum or sucking hard candy, and drinking plenty of water may help. Contact your care team if the problem does not go away or is severe. What side effects may I notice from receiving this medication? Side effects that you should report to your care team as soon as possible: Allergic reactions--skin rash, itching, hives, swelling of the face, lips, tongue, or throat Heart rhythm changes--fast or irregular heartbeat, dizziness, feeling faint or lightheaded, chest pain, trouble breathing Infection--fever, chills, cough, or sore throat Irritability, confusion, fast or irregular heartbeat, muscle stiffness, twitching muscles, sweating, high fever, seizure, chills, vomiting, diarrhea, which may be signs of serotonin syndrome Low sodium level--muscle weakness, fatigue, dizziness, headache, confusion Rash, fever, and swollen lymph nodes Redness, blistering, peeling or loosening of the skin, including inside the mouth Seizures Sudden eye pain or change in vision such as blurry vision, seeing halos around lights, vision loss Thoughts of suicide or self-harm, worsening mood, feelings of depression Side effects that usually do not require medical attention (report to your care team if they continue or are bothersome): Constipation Dizziness Drowsiness Dry mouth Increase in appetite Weight gain This list may not describe all possible side effects. Call your doctor for medical advice about side effects. You may report side effects to FDA at 1-800-FDA-1088. Where should  I keep my medication? Keep out of the reach of children. Store at room temperature between 15 and 30 degrees C (59 and 86 degrees F) Protect from light and moisture. Throw away any unused medication after the expiration date. NOTE: This sheet is a summary. It may not cover all possible information. If you have questions about this medicine, talk to your doctor, pharmacist, or health care provider.  2022 Elsevier/Gold Standard (2020-10-11 00:00:00)

## 2021-07-19 NOTE — Telephone Encounter (Signed)
Called patient left message on answering machine her labs are all normal ok in the fyi section I checked first

## 2021-07-19 NOTE — Progress Notes (Signed)
[facility] MD OP Progress Note  07/19/2021 6:06 PM Guy Toney  MRN:  [id number]  Chief Complaint:  Chief Complaint   Follow-up; Anxiety; Depression    HPI: Beverly Barrett is a 60 year old African-American female, married, lives in [place], has a history of MDD, GAD, chronic pain, arthritis, history of OSA on CPAP, hypothyroidism, migraine headaches, was evaluated in office today.  Last appointment with writer was on 08/12/2019.  Patient today reports she has been struggling with depression and anxiety since the past several months, getting worse.  She reports sadness, low motivation, anhedonia, concentration problems, sleep problems, racing thoughts.  Patient also reports feeling nervous, anxious, having anxiety attacks when she has chest pain or heaviness which can last for a few seconds to minutes.  Patient reports currently she is not on any medications for her anxiety or depression.  Patient reports sleep problems.  She has difficulty staying asleep.  She tries to go to bed early.  She uses a CPAP regularly.  However sleep is interrupted.  Patient does report appetite changes, reports she has not been eating much, feels nauseous often.  Currently undergoing work-up per primary care provider, recently started on Zofran which helps to some extent.  Patient denies any suicidality, homicidality or perceptual disturbances.  Patient appeared to be alert, oriented to person place time and situation.  Patient does report psychosocial stressors of her son who is 60 years old as well as his 56 year old grandson who moved in with her a month ago.  She reports her daughter and her 62-month-old lives with her.  She reports her daughter works third shift and she hence helps take care of the 67-month-old baby.  Her other daughter comes in to help.  Patient reports situation at home has been very stressful because it is very crowded and there has been times when there is not enough food in the house  although her children are trying to help her out.  Her husband is on disability and that is the only income that she has.  That does worry her.    Visit Diagnosis:    ICD-10-CM   1. MDD (major depressive disorder), recurrent episode, mild (HCC)  F33.0 mirtazapine (REMERON) 7.5 MG tablet    2. GAD (generalized anxiety disorder)  F41.1 mirtazapine (REMERON) 7.5 MG tablet    3. Sleep disorder  G47.9 mirtazapine (REMERON) 7.5 MG tablet   OSA , mood    4. Abnormal TSH  R79.89     5. Elevated blood pressure reading  R03.0       Past Psychiatric History: Reviewed past psychiatric history from progress note on 01/21/2018  Past Medical History:  Past Medical History:  Diagnosis Date   Anginal pain ([place])    none in approx 2 yrs   Anxiety    Asthma    Chronic lower back pain    Common migraine with intractable migraine 09/20/2016   Depression    suicidal ideation   Diabetes mellitus without complication (HCC)    GERD (gastroesophageal reflux disease)    Headache    migraines -3x/wk   Hyperlipidemia    Hypertension    Hypothyroidism    Motion sickness    cars   Multilevel degenerative disc disease    Shortness of breath dyspnea    Sleep apnea    CPAP   Vertigo    Wears dentures    partial upper    Past Surgical History:  Procedure Laterality Date   ABDOMINAL HYSTERECTOMY  bladder tach  1995   CESAREAN SECTION     COLONOSCOPY WITH PROPOFOL N/A 05/29/2015   Procedure: COLONOSCOPY WITH PROPOFOL;  Surgeon: Lucilla Lame, MD;  Location: [facility];  Service: Endoscopy;  Laterality: N/A;  Latex allergy sleep apnea - no CPAP machine (yet)   ESOPHAGOGASTRODUODENOSCOPY N/A 04/23/2017   Procedure: ESOPHAGOGASTRODUODENOSCOPY (EGD);  Surgeon: Lin Landsman, MD;  Location: [facility];  Service: Gastroenterology;  Laterality: N/A;  Latex allergy sleep apnea   POLYPECTOMY  05/29/2015   Procedure: POLYPECTOMY;  Surgeon: Lucilla Lame, MD;  Location: [facility];  Service: Endoscopy;;    Family Psychiatric History: Reviewed family psychiatric history from progress note on 01/21/2018  Family History:  Family History  Problem Relation Age of Onset   Diabetes Sister    Anxiety disorder Sister    Depression Sister    Hypertension Sister    Cirrhosis Mother    Diabetes Mother    Cirrhosis Father    Breast cancer Sister 13   Heart attack Brother    Alcohol abuse Brother    Drug abuse Brother    Prostate cancer Neg Hx    Kidney cancer Neg Hx    Bladder Cancer Neg Hx     Social History: Reviewed social history from progress note on 01/21/2018 Social History   Socioeconomic History   Marital status: Married    Spouse name: IT sales professional   Number of children: 3   Years of education: Not on file   Highest education level: GED or equivalent  Occupational History   Not on file  Tobacco Use   Smoking status: Never   Smokeless tobacco: Never  Vaping Use   Vaping Use: Never used  Substance and Sexual Activity   Alcohol use: No    Alcohol/week: 0.0 standard drinks   Drug use: No   Sexual activity: Not Currently    Partners: Male  Other Topics Concern   Not on file  Social History Narrative   Lives with husband and one daughter other kids are out of the house   She does not work, husband on disability    Social Determinants of Radio broadcast assistant Strain: Not on file  Food Insecurity: Not on file  Transportation Needs: Not on file  Physical Activity: Not on file  Stress: Not on file  Social Connections: Not on file    Allergies:  Allergies  Allergen Reactions   Latex Swelling    Pt unsure of reaction pt states something happened while in the hospital after surgery.    Metabolic Disorder Labs: Lab Results  Component Value Date   HGBA1C 6.4 (A) 03/28/2021   MPG 162.81 08/28/2019   MPG 151 12/09/2018   No results found for: PROLACTIN Lab Results  Component Value Date   CHOL 165 03/12/2021   TRIG 278 (H)  03/12/2021   HDL 40 (L) 03/12/2021   CHOLHDL 4.1 03/12/2021   VLDL 44 (H) 02/12/2017   LDLCALC 88 03/12/2021   LDLCALC 127 (H) 08/10/2020   Lab Results  Component Value Date   TSH 16.03 (H) 03/12/2021   TSH 11.47 (H) 08/10/2020    Therapeutic Level Labs: No results found for: LITHIUM No results found for: VALPROATE No components found for:  CBMZ  Current Medications: Current Outpatient Medications  Medication Sig Dispense Refill   acetaminophen (TYLENOL) 500 MG tablet Take 2 tablets (1,000 mg total) by mouth every 6 (six) hours as needed. 30 tablet 0   amLODipine (  NORVASC) 2.5 MG tablet TAKE 1 TABLET BY MOUTH ONCE A DAY 90 tablet 1   aspirin 81 MG EC tablet Take 81 mg by mouth daily.     cloNIDine (CATAPRES) 0.1 MG tablet TAKE 1 TABLET BY MOUTH TWICE A DAY 180 tablet 1   docusate sodium (COLACE) 100 MG capsule Take 100 mg by mouth daily.     estradiol (ESTRACE VAGINAL) 0.1 MG/GM vaginal cream Apply 0.5mg  (pea-sized amount)  just inside the vaginal introitus with a finger-tip on Monday, Wednesday and Friday nights. 30 g 12   fluticasone (FLONASE) 50 MCG/ACT nasal spray PLACE 2 SPRAYS INTO BOTH NOSTRILS DAILY AS NEEDED FOR ALLERGIES 48 mL 0   fluticasone (FLOVENT HFA) 44 MCG/ACT inhaler Inhale 2 puffs into the lungs 2 (two) times daily. In place of Asmanex 1 each 2   gabapentin (NEURONTIN) 400 MG capsule 1 capsule in the morning and at midday, 2 in the evening 360 capsule 1   glucose blood test strip Use as instructed 100 each 12   ketoconazole (NIZORAL) 2 % cream Apply 1 application topically daily. 60 g 0   Lancet Devices (ONE TOUCH DELICA LANCING DEV) MISC 1 each by Does not apply route daily. 1 each 0   levothyroxine (SYNTHROID) 125 MCG tablet Take 1 tablet (125 mcg total) by mouth daily. 30 tablet 0   LINZESS 145 MCG CAPS capsule Take 145 mcg by mouth daily.     metFORMIN (GLUCOPHAGE-XR) 750 MG 24 hr tablet TAKE 2 TABLETS BY MOUTH ONCE A DAY WITH BREAKFAST 180 tablet 1    metoprolol succinate (TOPROL-XL) 50 MG 24 hr tablet TAKE 1 TABLET BY MOUTH ONCE A DAY WITH OR IMMEDIATELY FOLLOWING A MEAL. 90 tablet 1   mirtazapine (REMERON) 7.5 MG tablet Take 1 tablet (7.5 mg total) by mouth at bedtime. 30 tablet 1   montelukast (SINGULAIR) 10 MG tablet Take 1 tablet (10 mg total) by mouth at bedtime. 90 tablet 3   nitroGLYCERIN (NITROSTAT) 0.4 MG SL tablet Place 1 tablet (0.4 mg total) under the tongue every 5 (five) minutes as needed. 25 tablet 0   olmesartan-hydrochlorothiazide (BENICAR HCT) 40-25 MG tablet Take 1 tablet by mouth daily. 90 tablet 1   omeprazole (PRILOSEC) 40 MG capsule TAKE 1 CAPSULE BY MOUTH ONCE A DAY 90 capsule 1   ondansetron (ZOFRAN) 4 MG tablet Take 1 tablet (4 mg total) by mouth every 8 (eight) hours as needed for nausea or vomiting. 15 tablet 0   OneTouch Delica Lancets 94W MISC 1 each by Does not apply route daily. 100 each 2   oxybutynin (DITROPAN XL) 15 MG 24 hr tablet Take 1 tablet (15 mg total) by mouth daily. 90 tablet 3   pioglitazone (ACTOS) 15 MG tablet Take 1 tablet (15 mg total) by mouth daily. 90 tablet 1   polyethylene glycol (MIRALAX) 17 g packet Take 17 g by mouth daily as needed for mild constipation. 14 each 0   rizatriptan (MAXALT) 10 MG tablet May repeat in 2 hours if needed 24 tablet 0   rosuvastatin (CRESTOR) 40 MG tablet Take 1 tablet (40 mg total) by mouth daily. In place of Atorvastatin 90 tablet 3   senna (SENOKOT) 8.6 MG TABS tablet Take 1 tablet by mouth daily.     VENTOLIN HFA 108 (90 Base) MCG/ACT inhaler INHALE 2 PUFFS BY MOUTH EVERY 6 HOURS AS NEEDED FOR WHEEZING OR SHORTNESS OF BREATH 2 each 0   Galcanezumab-gnlm (EMGALITY) 120 MG/ML SOAJ Inject 120 mg  into the skin every 30 (thirty) days. (Patient not taking: Reported on 07/19/2021) 1 mL 11   hydrOXYzine (ATARAX/VISTARIL) 25 MG tablet TAKE 1 TABLET BY MOUTH EVERY 8 HOURS AS NEEDED (Patient not taking: Reported on 07/19/2021) 30 tablet 0   No current  facility-administered medications for this visit.     Musculoskeletal: Strength & Muscle Tone: within normal limits Gait & Station:  walks with cane Patient leans: N/A  Psychiatric Specialty Exam: Review of Systems  Constitutional:  Positive for appetite change, fatigue and unexpected weight change.  Psychiatric/Behavioral:  Positive for decreased concentration, dysphoric mood and sleep disturbance. The patient is nervous/anxious.   All other systems reviewed and are negative.  Blood pressure (!) 144/82, pulse 87, temperature 98.5 F (36.9 C), height 5' 1.25" (1.556 m), weight 219 lb (99.3 kg), SpO2 99 %.Body mass index is 41.04 kg/m.  General Appearance: Casual  Eye Contact:  Fair  Speech:  Clear and Coherent  Volume:  Normal  Mood:  Anxious and Depressed  Affect:  Depressed  Thought Process:  Goal Directed and Descriptions of Associations: Intact  Orientation:  Full (Time, Place, and Person)  Thought Content: Logical   Suicidal Thoughts:  No  Homicidal Thoughts:  No  Memory:  Immediate;   Fair Recent;   Fair Remote;   Limited  Judgement:  Fair  Insight:  Shallow  Psychomotor Activity:  Normal  Concentration:  Concentration: Fair and Attention Span: Fair  Recall:  [employer] of Knowledge: Fair  Language: Fair  Akathisia:  No  Handed:  Right  AIMS (if indicated): done, 0  Assets:  Communication Skills Desire for Improvement Housing Intimacy Social Support Transportation  ADL's:  Intact  Cognition: WNL  Sleep:  Poor   Screenings: GAD-7    Flowsheet Row Office Visit from 03/19/2018 in [facility] Office Visit from 03/04/2016 in [facility] Office Visit from 12/20/2015 in [facility]  Total GAD-7 Score 14 18 18       PHQ2-9    [place] Visit from 07/19/2021 in [facility] Office Visit from 05/23/2021 in [facility] Video Visit from 04/18/2021 in  [facility] Office Visit from 03/28/2021 in [facility] Video Visit from 03/12/2021 in [facility]  PHQ-2 Total Score 2 6 0 5 1  PHQ-9 Total Score 10 11 0 11 8      [place] Office Visit from 07/19/2021 in [facility] No Risk        Assessment and Plan: Benna Arno is a 61 year old African-American female, married, lives in [place], has a history of MDD, GAD, insomnia, OSA on CPAP, migraine headaches, hypothyroidism, arthritis was evaluated in office today.  Patient with multiple psychosocial stressors, noncompliance with follow-up and medications for anxiety and depression today returns with worsening mood symptoms, sleep problems, appetite changes as well as weight loss.  She will benefit from medication management, plan as noted below.  Plan MDD-unstable Start mirtazapine 7.5 mg p.o. nightly  GAD-unstable Start mirtazapine 7.5 mg p.o. nightly  Sleep disorder-unstable Encouraged compliance on CPAP for OSA Mirtazapine will also help.  Abnormal TSH-unstable Reviewed and discussed TSH level-dated 03/12/2021-16.03-elevated Patient will benefit from repeat TSH level, she agrees to talk to her primary care provider.   Elevated blood pressure reading-patient advised to keep a log of her blood pressure and follow up with primary care provider.  She reports compliance  on her antihypertensive medications.  Patient with appetite change, weight loss, will continue to follow-up with her primary care provider for management.  I have reviewed notes per Ms. Sarah Slack-NP-[place] neurologic associate-dated 07/04/2021-patient with chronic headaches-Emgality added for migraine prevention. I have reviewed notes per [facility]-dated 07/19/2021-gastric emptying study came back normal.  Follow-up in clinic in 2 weeks or sooner in person.  I have spent atleast 40 minutes face  to face with patient today which includes the time spent for preparing to see the patient ( e.g., review of test, records ), obtaining and to review and separately obtained history , ordering medications and test ,psychoeducation and supportive psychotherapy and care coordination,as well as documenting clinical information in electronic health record,interpreting and communication of test results   This note was generated in part or whole with voice recognition software. Voice recognition is usually quite accurate but there are transcription errors that can and very often do occur. I apologize for any typographical errors that were not detected and corrected.     Ursula Alert, MD 07/19/2021, 6:06 PM

## 2021-07-19 NOTE — Telephone Encounter (Signed)
-----   Message from Lin Landsman, MD sent at 07/16/2021 11:52 AM EST ----- Please inform patient that her gastric emptying study came back normal  RV
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
## 2021-08-02 ENCOUNTER — Ambulatory Visit (INDEPENDENT_AMBULATORY_CARE_PROVIDER_SITE_OTHER): Payer: 59 | Admitting: Psychiatry

## 2021-08-02 ENCOUNTER — Other Ambulatory Visit: Payer: Self-pay

## 2021-08-02 ENCOUNTER — Encounter: Payer: Self-pay | Admitting: Psychiatry

## 2021-08-02 VITALS — BP 138/77 | HR 71 | Temp 98.5°F | Wt 216.6 lb

## 2021-08-02 DIAGNOSIS — F330 Major depressive disorder, recurrent, mild: Secondary | ICD-10-CM | POA: Diagnosis not present

## 2021-08-02 DIAGNOSIS — F411 Generalized anxiety disorder: Secondary | ICD-10-CM | POA: Diagnosis not present

## 2021-08-02 DIAGNOSIS — R7989 Other specified abnormal findings of blood chemistry: Secondary | ICD-10-CM | POA: Diagnosis not present

## 2021-08-02 DIAGNOSIS — G479 Sleep disorder, unspecified: Secondary | ICD-10-CM | POA: Diagnosis not present

## 2021-08-02 MED ORDER — MIRTAZAPINE 7.5 MG PO TABS: 7.5000 mg | ORAL_TABLET | Freq: Every day | ORAL | 1 refills | Status: DC

## 2021-08-02 NOTE — Patient Instructions (Signed)
Mirtazapine Tablets What is this medication? MIRTAZAPINE (mir TAZ a peen) treats depression. It increases the amount of serotonin and norepinephrine in the brain, hormones that help regulate mood. This medicine may be used for other purposes; ask your health care provider or pharmacist if you have questions. COMMON BRAND NAME(S): Remeron What should I tell my care team before I take this medication? They need to know if you have any of these conditions: Bipolar disorder Glaucoma Kidney disease Liver disease Suicidal thoughts An unusual or allergic reaction to mirtazapine, other medications, foods, dyes, or preservatives Pregnant or trying to get pregnant Breast-feeding How should I use this medication? Take this medication by mouth with a glass of water. Follow the directions on the prescription label. Take your medication at regular intervals. Do not take your medication more often than directed. Do not stop taking this medication suddenly except upon the advice of your care team. Stopping this medication too quickly may cause serious side effects or your condition may worsen. A special MedGuide will be given to you by the pharmacist with each prescription and refill. Be sure to read this information carefully each time. Talk to your care team about the use of this medication in children. Special care may be needed. Overdosage: If you think you have taken too much of this medicine contact a poison control center or emergency room at once. NOTE: This medicine is only for you. Do not share this medicine with others. What if I miss a dose? If you miss a dose, take it as soon as you can. If it is almost time for your next dose, take only that dose. Do not take double or extra doses. What may interact with this medication? Do not take this medication with any of the following: Linezolid MAOIs like Carbex, Eldepryl, Marplan, Nardil, and Parnate Methylene blue (injected into a vein) This  medication may also interact with the following: Alcohol Antiviral medications for HIV or AIDS Certain medications that treat or prevent blood clots like warfarin Certain medications for depression, anxiety, or psychotic disturbances Certain medications for fungal infections like ketoconazole and itraconazole Certain medications for migraine headache like almotriptan, eletriptan, frovatriptan, naratriptan, rizatriptan, sumatriptan, zolmitriptan Certain medications for seizures like carbamazepine or phenytoin Certain medications for sleep Cimetidine Erythromycin Fentanyl Lithium Medications for blood pressure Nefazodone Rasagiline Rifampin Supplements like St. John's wort, kava kava, valerian Tramadol Tryptophan This list may not describe all possible interactions. Give your health care provider a list of all the medicines, herbs, non-prescription drugs, or dietary supplements you use. Also tell them if you smoke, drink alcohol, or use illegal drugs. Some items may interact with your medicine. What should I watch for while using this medication? Tell your care team if your symptoms do not get better or if they get worse. Visit your care team for regular checks on your progress. Because it may take several weeks to see the full effects of this medication, it is important to continue your treatment as prescribed by your doctor. Patients and their families should watch out for new or worsening thoughts of suicide or depression. Also watch out for sudden changes in feelings such as feeling anxious, agitated, panicky, irritable, hostile, aggressive, impulsive, severely restless, overly excited and hyperactive, or not being able to sleep. If this happens, especially at the beginning of treatment or after a change in dose, call your care team. You may get drowsy or dizzy. Do not drive, use machinery, or do anything that needs mental alertness until  you know how this medication affects you. Do not  stand or sit up quickly, especially if you are an older patient. This reduces the risk of dizzy or fainting spells. Alcohol may interfere with the effect of this medication. Avoid alcoholic drinks. This medication may cause dry eyes and blurred vision. If you wear contact lenses you may feel some discomfort. Lubricating drops may help. See your eye care team if the problem does not go away or is severe. Your mouth may get dry. Chewing sugarless gum or sucking hard candy, and drinking plenty of water may help. Contact your care team if the problem does not go away or is severe. What side effects may I notice from receiving this medication? Side effects that you should report to your care team as soon as possible: Allergic reactions--skin rash, itching, hives, swelling of the face, lips, tongue, or throat Heart rhythm changes--fast or irregular heartbeat, dizziness, feeling faint or lightheaded, chest pain, trouble breathing Infection--fever, chills, cough, or sore throat Irritability, confusion, fast or irregular heartbeat, muscle stiffness, twitching muscles, sweating, high fever, seizure, chills, vomiting, diarrhea, which may be signs of serotonin syndrome Low sodium level--muscle weakness, fatigue, dizziness, headache, confusion Rash, fever, and swollen lymph nodes Redness, blistering, peeling or loosening of the skin, including inside the mouth Seizures Sudden eye pain or change in vision such as blurry vision, seeing halos around lights, vision loss Thoughts of suicide or self-harm, worsening mood, feelings of depression Side effects that usually do not require medical attention (report to your care team if they continue or are bothersome): Constipation Dizziness Drowsiness Dry mouth Increase in appetite Weight gain This list may not describe all possible side effects. Call your doctor for medical advice about side effects. You may report side effects to FDA at 1-800-FDA-1088. Where should  I keep my medication? Keep out of the reach of children. Store at room temperature between 15 and 30 degrees C (59 and 86 degrees F) Protect from light and moisture. Throw away any unused medication after the expiration date. NOTE: This sheet is a summary. It may not cover all possible information. If you have questions about this medicine, talk to your doctor, pharmacist, or health care provider.  2022 Elsevier/Gold Standard (2020-10-11 00:00:00)

## 2021-08-02 NOTE — Progress Notes (Signed)
[facility] MD OP Progress Note  08/02/2021 9:41 AM Beverly Barrett  MRN:  [id number]  Chief Complaint:  Chief Complaint   Follow-up; Anxiety; Depression    HPI: Beverly Barrett is a 61 year old African-American female, married, lives in [place], has a history of MDD, GAD, chronic pain, arthritis, history of OSA on CPAP, hypothyroidism, migraine headaches was evaluated in office today.  Patient's last appointment was on 07/19/2021.  Patient had reported worsening depression and anxiety symptoms at that visit and was started on mirtazapine.  Patient returns today reporting she was never able to get the prescription from the pharmacy.  Patient hence has been noncompliant.  She continues to endorse sadness, hopelessness, low motivation, concentration problems, anhedonia, sleep problems,.  She reports appetite is a little bit better with the holidays.  She continues to struggle with sleep although she uses CPAP for her OSA.  Patient appeared to have some trouble with her memory, delayed responses to questions although she was able to answer appropriately.  Remote memory test, immediate 3 out of 3, after 5 minutes 3 out of 3.  Patient appeared to be alert, oriented to time person and place.  Patient with history of hypothyroidism, abnormal TSH labs in August 2022, was advised to repeat the labs however has been noncompliant.  Patient will benefit from management of her thyroid abnormalities which likely could also be contributing to her multiple mood symptoms and sleep problems.  Patient denies any other concerns today.    Visit Diagnosis:    ICD-10-CM   1. MDD (major depressive disorder), recurrent episode, mild (HCC)  F33.0 mirtazapine (REMERON) 7.5 MG tablet    2. GAD (generalized anxiety disorder)  F41.1 mirtazapine (REMERON) 7.5 MG tablet    3. Sleep disorder  G47.9 mirtazapine (REMERON) 7.5 MG tablet   OSA , mood    4. Abnormal TSH  R79.89       Past Psychiatric History: I have  reviewed past psychiatric history from progress note on 01/21/2018.  Past Medical History:  Past Medical History:  Diagnosis Date   Anginal pain ([place])    none in approx 2 yrs   Anxiety    Asthma    Chronic lower back pain    Common migraine with intractable migraine 09/20/2016   Depression    suicidal ideation   Diabetes mellitus without complication (HCC)    GERD (gastroesophageal reflux disease)    Headache    migraines -3x/wk   Hyperlipidemia    Hypertension    Hypothyroidism    Motion sickness    cars   Multilevel degenerative disc disease    Shortness of breath dyspnea    Sleep apnea    CPAP   Vertigo    Wears dentures    partial upper    Past Surgical History:  Procedure Laterality Date   ABDOMINAL HYSTERECTOMY     bladder tach  1995   CESAREAN SECTION     COLONOSCOPY WITH PROPOFOL N/A 05/29/2015   Procedure: COLONOSCOPY WITH PROPOFOL;  Surgeon: Lucilla Lame, MD;  Location: [facility];  Service: Endoscopy;  Laterality: N/A;  Latex allergy sleep apnea - no CPAP machine (yet)   ESOPHAGOGASTRODUODENOSCOPY N/A 04/23/2017   Procedure: ESOPHAGOGASTRODUODENOSCOPY (EGD);  Surgeon: Lin Landsman, MD;  Location: [facility];  Service: Gastroenterology;  Laterality: N/A;  Latex allergy sleep apnea   POLYPECTOMY  05/29/2015   Procedure: POLYPECTOMY;  Surgeon: Lucilla Lame, MD;  Location: [facility];  Service: Endoscopy;;    Family Psychiatric History:  Reviewed family psychiatric history from progress note on 01/21/2018.  Family History:  Family History  Problem Relation Age of Onset   Diabetes Sister    Anxiety disorder Sister    Depression Sister    Hypertension Sister    Cirrhosis Mother    Diabetes Mother    Cirrhosis Father    Breast cancer Sister 63   Heart attack Brother    Alcohol abuse Brother    Drug abuse Brother    Prostate cancer Neg Hx    Kidney cancer Neg Hx    Bladder Cancer Neg Hx     Social History: Reviewed social  history from progress note from 01/21/2018. Social History   Socioeconomic History   Marital status: Married    Spouse name: IT sales professional   Number of children: 3   Years of education: Not on file   Highest education level: GED or equivalent  Occupational History   Not on file  Tobacco Use   Smoking status: Never   Smokeless tobacco: Never  Vaping Use   Vaping Use: Never used  Substance and Sexual Activity   Alcohol use: No    Alcohol/week: 0.0 standard drinks   Drug use: No   Sexual activity: Not Currently    Partners: Male  Other Topics Concern   Not on file  Social History Narrative   Lives with husband and one daughter other kids are out of the house   She does not work, husband on disability    Social Determinants of Radio broadcast assistant Strain: Not on file  Food Insecurity: Not on file  Transportation Needs: Not on file  Physical Activity: Not on file  Stress: Not on file  Social Connections: Not on file    Allergies:  Allergies  Allergen Reactions   Latex Swelling    Pt unsure of reaction pt states something happened while in the hospital after surgery.    Metabolic Disorder Labs: Lab Results  Component Value Date   HGBA1C 6.4 (A) 03/28/2021   MPG 162.81 08/28/2019   MPG 151 12/09/2018   No results found for: PROLACTIN Lab Results  Component Value Date   CHOL 165 03/12/2021   TRIG 278 (H) 03/12/2021   HDL 40 (L) 03/12/2021   CHOLHDL 4.1 03/12/2021   VLDL 44 (H) 02/12/2017   LDLCALC 88 03/12/2021   LDLCALC 127 (H) 08/10/2020   Lab Results  Component Value Date   TSH 16.03 (H) 03/12/2021   TSH 11.47 (H) 08/10/2020    Therapeutic Level Labs: No results found for: LITHIUM No results found for: VALPROATE No components found for:  CBMZ  Current Medications: Current Outpatient Medications  Medication Sig Dispense Refill   acetaminophen (TYLENOL) 500 MG tablet Take 2 tablets (1,000 mg total) by mouth every 6 (six) hours as needed. 30  tablet 0   amLODipine (NORVASC) 2.5 MG tablet TAKE 1 TABLET BY MOUTH ONCE A DAY 90 tablet 1   aspirin 81 MG EC tablet Take 81 mg by mouth daily.     cloNIDine (CATAPRES) 0.1 MG tablet TAKE 1 TABLET BY MOUTH TWICE A DAY 180 tablet 1   docusate sodium (COLACE) 100 MG capsule Take 100 mg by mouth daily.     estradiol (ESTRACE VAGINAL) 0.1 MG/GM vaginal cream Apply 0.5mg  (pea-sized amount)  just inside the vaginal introitus with a finger-tip on Monday, Wednesday and Friday nights. 30 g 12   fluticasone (FLONASE) 50 MCG/ACT nasal spray PLACE 2 SPRAYS INTO BOTH NOSTRILS  DAILY AS NEEDED FOR ALLERGIES 48 mL 0   fluticasone (FLOVENT HFA) 44 MCG/ACT inhaler Inhale 2 puffs into the lungs 2 (two) times daily. In place of Asmanex 1 each 2   gabapentin (NEURONTIN) 400 MG capsule 1 capsule in the morning and at midday, 2 in the evening 360 capsule 1   Galcanezumab-gnlm (EMGALITY) 120 MG/ML SOAJ Inject 120 mg into the skin every 30 (thirty) days. 1 mL 11   glucose blood test strip Use as instructed 100 each 12   hydrOXYzine (ATARAX/VISTARIL) 25 MG tablet TAKE 1 TABLET BY MOUTH EVERY 8 HOURS AS NEEDED 30 tablet 0   ketoconazole (NIZORAL) 2 % cream Apply 1 application topically daily. 60 g 0   Lancet Devices (ONE TOUCH DELICA LANCING DEV) MISC 1 each by Does not apply route daily. 1 each 0   levothyroxine (SYNTHROID) 125 MCG tablet Take 1 tablet (125 mcg total) by mouth daily. 30 tablet 0   LINZESS 145 MCG CAPS capsule Take 145 mcg by mouth daily.     metFORMIN (GLUCOPHAGE-XR) 750 MG 24 hr tablet TAKE 2 TABLETS BY MOUTH ONCE A DAY WITH BREAKFAST 180 tablet 1   metoprolol succinate (TOPROL-XL) 50 MG 24 hr tablet TAKE 1 TABLET BY MOUTH ONCE A DAY WITH OR IMMEDIATELY FOLLOWING A MEAL. 90 tablet 1   montelukast (SINGULAIR) 10 MG tablet Take 1 tablet (10 mg total) by mouth at bedtime. 90 tablet 3   nitroGLYCERIN (NITROSTAT) 0.4 MG SL tablet Place 1 tablet (0.4 mg total) under the tongue every 5 (five) minutes as needed.  25 tablet 0   olmesartan-hydrochlorothiazide (BENICAR HCT) 40-25 MG tablet Take 1 tablet by mouth daily. 90 tablet 1   omeprazole (PRILOSEC) 40 MG capsule TAKE 1 CAPSULE BY MOUTH ONCE A DAY 90 capsule 1   ondansetron (ZOFRAN) 4 MG tablet Take 1 tablet (4 mg total) by mouth every 8 (eight) hours as needed for nausea or vomiting. 15 tablet 0   OneTouch Delica Lancets 76E MISC 1 each by Does not apply route daily. 100 each 2   oxybutynin (DITROPAN XL) 15 MG 24 hr tablet Take 1 tablet (15 mg total) by mouth daily. 90 tablet 3   pioglitazone (ACTOS) 15 MG tablet Take 1 tablet (15 mg total) by mouth daily. 90 tablet 1   polyethylene glycol (MIRALAX) 17 g packet Take 17 g by mouth daily as needed for mild constipation. 14 each 0   rizatriptan (MAXALT) 10 MG tablet May repeat in 2 hours if needed 24 tablet 0   rosuvastatin (CRESTOR) 40 MG tablet Take 1 tablet (40 mg total) by mouth daily. In place of Atorvastatin 90 tablet 3   senna (SENOKOT) 8.6 MG TABS tablet Take 1 tablet by mouth daily.     VENTOLIN HFA 108 (90 Base) MCG/ACT inhaler INHALE 2 PUFFS BY MOUTH EVERY 6 HOURS AS NEEDED FOR WHEEZING OR SHORTNESS OF BREATH 2 each 0   mirtazapine (REMERON) 7.5 MG tablet Take 1 tablet (7.5 mg total) by mouth at bedtime. 30 tablet 1   No current facility-administered medications for this visit.     Musculoskeletal: Strength & Muscle Tone: within normal limits Gait & Station: normal Patient leans: N/A  Psychiatric Specialty Exam: Review of Systems  Constitutional:  Positive for appetite change.  Psychiatric/Behavioral:  Positive for decreased concentration, dysphoric mood and sleep disturbance. The patient is nervous/anxious.   All other systems reviewed and are negative.  Blood pressure 138/77, pulse 71, temperature 98.5 F (36.9 C), temperature  source Temporal, weight 216 lb 9.6 oz (98.2 kg).Body mass index is 40.59 kg/m.  General Appearance: Casual  Eye Contact:  Fair  Speech:  Normal Rate   Volume:  Normal  Mood:  Anxious and Depressed  Affect:  Congruent  Thought Process:  Goal Directed and Descriptions of Associations: Intact  Orientation:  Full (Time, Place, and Person)  Thought Content: Logical   Suicidal Thoughts:  No  Homicidal Thoughts:  No  Memory:  Immediate;   Fair Recent;   Fair Remote;   Fair short term memory loss  Judgement:  Fair  Insight:  Shallow  Psychomotor Activity:  Normal  Concentration:  Concentration: Fair and Attention Span: Fair  Recall:  [employer] of Knowledge: Fair  Language: Fair  Akathisia:  No  Handed:  Right  AIMS (if indicated): not done  Assets:  Communication Skills Desire for Improvement Social Support  ADL's:  Intact  Cognition: WNL  Sleep:  Poor   Screenings: GAD-7    Flowsheet Row Office Visit from 03/19/2018 in [facility] Office Visit from 03/04/2016 in [facility] Office Visit from 12/20/2015 in [facility]  Total GAD-7 Score 14 18 18       PHQ2-9    [facility] Office Visit from 08/02/2021 in [facility] Office Visit from 07/19/2021 in [facility] Office Visit from 05/23/2021 in [facility] Video Visit from 04/18/2021 in [facility] Office Visit from 03/28/2021 in [facility]  PHQ-2 Total Score 3 2 6  0 5  PHQ-9 Total Score 14 10 11  0 11      [facility] Office Visit from 08/02/2021 in [facility] Office Visit from 07/19/2021 in [facility] No Risk No Risk        Assessment and Plan: Beverly Barrett is a 61 year old African-American female, married, lives in [place], has a history of MDD, GAD, insomnia, OSA on CPAP, migraine headaches, hypothyroidism, arthritis was evaluated in office today.  Patient with multiple psychosocial stressors continues to be  noncompliant with medications as well as labs and continues to have depression and anxiety.  She will benefit from the following plan.   Plan MDD-unstable Start mirtazapine 7.5 mg p.o. nightly.  Attempted to contact pharmacy however unable to get a response.  Hence printed out prescription and provided to patient.  Patient advised to call the clinic back if she has any problem filling this prescription.  GAD-unstable Start mirtazapine 7.5 mg p.o. nightly  Sleep disorder-unstable Encourage compliance on CPAP for OSA Mirtazapine will also help. Patient with TSH abnormalities likely also contributing to mood and sleep problems.  Abnormal TSH-unstable Patient has been noncompliant with TSH repeat levels.  Level on 03/12/2021-elevated at 16.03. Patient will benefit from repeat TSH level.  TSH abnormality is likely also contributing to her mood, sleep. We will coordinate care with her primary care provider.  Follow-up in clinic in 3 weeks or sooner in person.  This note was generated in part or whole with voice recognition software. Voice recognition is usually quite accurate but there are transcription errors that can and very often do occur. I apologize for any typographical errors that were not detected and corrected.    Ursula Alert, MD 08/03/2021, 1:40 PM

## 2021-08-05 ENCOUNTER — Encounter: Payer: Self-pay | Admitting: Family Medicine

## 2021-08-06 ENCOUNTER — Encounter: Payer: Self-pay | Admitting: Family Medicine

## 2021-08-06 ENCOUNTER — Telehealth (INDEPENDENT_AMBULATORY_CARE_PROVIDER_SITE_OTHER): Payer: Managed Care, Other (non HMO) | Admitting: Family Medicine

## 2021-08-06 DIAGNOSIS — R051 Acute cough: Secondary | ICD-10-CM

## 2021-08-06 DIAGNOSIS — E1159 Type 2 diabetes mellitus with other circulatory complications: Secondary | ICD-10-CM | POA: Diagnosis not present

## 2021-08-06 DIAGNOSIS — E785 Hyperlipidemia, unspecified: Secondary | ICD-10-CM

## 2021-08-06 DIAGNOSIS — E034 Atrophy of thyroid (acquired): Secondary | ICD-10-CM | POA: Diagnosis not present

## 2021-08-06 DIAGNOSIS — E1169 Type 2 diabetes mellitus with other specified complication: Secondary | ICD-10-CM

## 2021-08-06 DIAGNOSIS — J4521 Mild intermittent asthma with (acute) exacerbation: Secondary | ICD-10-CM

## 2021-08-06 DIAGNOSIS — R062 Wheezing: Secondary | ICD-10-CM

## 2021-08-06 DIAGNOSIS — B349 Viral infection, unspecified: Secondary | ICD-10-CM | POA: Diagnosis not present

## 2021-08-06 DIAGNOSIS — I152 Hypertension secondary to endocrine disorders: Secondary | ICD-10-CM

## 2021-08-06 LAB — POCT INFLUENZA A/B
Influenza A, POC: NEGATIVE
Influenza B, POC: NEGATIVE

## 2021-08-06 MED ORDER — UMECLIDINIUM-VILANTEROL 62.5-25 MCG/ACT IN AEPB: 1.0000 | INHALATION_SPRAY | Freq: Every day | RESPIRATORY_TRACT | 0 refills | Status: DC

## 2021-08-06 MED ORDER — BENZONATATE 100 MG PO CAPS: 100.0000 mg | ORAL_CAPSULE | Freq: Three times a day (TID) | ORAL | 0 refills | Status: DC | PRN

## 2021-08-06 NOTE — Telephone Encounter (Signed)
Lvm on both numbers that we had changed her appt to virtual and that she needed to cal [place] and give [place] her new insurance since [place] ended dec31, 2022

## 2021-08-07 ENCOUNTER — Other Ambulatory Visit: Payer: Self-pay | Admitting: Family Medicine

## 2021-08-07 DIAGNOSIS — E034 Atrophy of thyroid (acquired): Secondary | ICD-10-CM

## 2021-08-07 LAB — HEMOGLOBIN A1C
Hgb A1c MFr Bld: 6.8 % of total Hgb — ABNORMAL HIGH (ref <5.7)
Mean Plasma Glucose: 148 mg/dL
eAG (mmol/L): 8.2 mmol/L

## 2021-08-07 LAB — TSH: TSH: 15.48 mIU/L — ABNORMAL HIGH (ref 0.40–4.50)

## 2021-08-07 MED ORDER — LEVOTHYROXINE SODIUM 137 MCG PO TABS: 137.0000 ug | ORAL_TABLET | Freq: Every day | ORAL | 1 refills | Status: DC

## 2021-08-08 LAB — SARS-COV-2, NAA 2 DAY TAT

## 2021-08-08 LAB — NOVEL CORONAVIRUS, NAA: SARS-CoV-2, NAA: NOT DETECTED

## 2021-08-08 LAB — SPECIMEN STATUS REPORT

## 2021-08-27 ENCOUNTER — Other Ambulatory Visit: Payer: Self-pay

## 2021-08-27 ENCOUNTER — Encounter: Payer: Self-pay | Admitting: Psychiatry

## 2021-08-27 ENCOUNTER — Ambulatory Visit (INDEPENDENT_AMBULATORY_CARE_PROVIDER_SITE_OTHER): Payer: 59 | Admitting: Psychiatry

## 2021-08-27 VITALS — BP 144/79 | HR 94 | Temp 98.6°F | Wt 221.0 lb

## 2021-08-27 DIAGNOSIS — F330 Major depressive disorder, recurrent, mild: Secondary | ICD-10-CM | POA: Diagnosis not present

## 2021-08-27 DIAGNOSIS — F411 Generalized anxiety disorder: Secondary | ICD-10-CM | POA: Diagnosis not present

## 2021-08-27 DIAGNOSIS — R413 Other amnesia: Secondary | ICD-10-CM | POA: Diagnosis not present

## 2021-08-27 DIAGNOSIS — G479 Sleep disorder, unspecified: Secondary | ICD-10-CM

## 2021-08-27 NOTE — Progress Notes (Signed)
[facility] MD OP Progress Note  08/27/2021 1:02 PM Beverly Barrett  MRN:  [id number]  Chief Complaint:  Chief Complaint   Follow-up 61 year old African-American female, has a history of MDD, GAD, OSA on CPAP, hypothyroidism, presents for medication management.    HPI: Beverly Barrett is a 61 year old African-American female, married, lives in [place], has a history of MDD, GAD, chronic pain, arthritis, history of OSA on CPAP, hypothyroidism, migraine headaches was evaluated in office today.  Patient reports she does have psychosocial stressors of having a leak at her home which does bother her.  Patient reports she however has been managing her anxiety okay.  She is currently compliant on the mirtazapine.  Denies side effects.  Reports sleep has improved.  Patient does struggle with memory problems, mostly short-term memory issues.  Patient completed an MMSE while in session today-scored 27 out of 30.  Patient with recent abnormal TSH level, will continue to need management for the same, currently compliant on levothyroxine.  Patient agrees to follow up with her primary care provider.  Patient also has neurologist who manages her headaches and agrees to follow-up with neurology for further management of cognitive issues.  Denies suicidality, homicidality or perceptual disturbances.  Denies any other concerns today.  Visit Diagnosis:    ICD-10-CM   1. MDD (major depressive disorder), recurrent episode, mild ([place])  F33.0     2. GAD (generalized anxiety disorder)  F41.1     3. Sleep disorder  G47.9    OSA, mood    4. Memory loss  R41.3       Past Psychiatric History: Reviewed past psychiatric history from progress note on 01/21/2018.  Past Medical History:  Past Medical History:  Diagnosis Date   Anginal pain ([place])    none in approx 2 yrs   Anxiety    Asthma    Chronic lower back pain    Common migraine with intractable migraine 09/20/2016   Depression    suicidal ideation    Diabetes mellitus without complication (HCC)    GERD (gastroesophageal reflux disease)    Headache    migraines -3x/wk   Hyperlipidemia    Hypertension    Hypothyroidism    Motion sickness    cars   Multilevel degenerative disc disease    Shortness of breath dyspnea    Sleep apnea    CPAP   Vertigo    Wears dentures    partial upper    Past Surgical History:  Procedure Laterality Date   ABDOMINAL HYSTERECTOMY     bladder tach  1995   CESAREAN SECTION     COLONOSCOPY WITH PROPOFOL N/A 05/29/2015   Procedure: COLONOSCOPY WITH PROPOFOL;  Surgeon: Lucilla Lame, MD;  Location: [facility];  Service: Endoscopy;  Laterality: N/A;  Latex allergy sleep apnea - no CPAP machine (yet)   ESOPHAGOGASTRODUODENOSCOPY N/A 04/23/2017   Procedure: ESOPHAGOGASTRODUODENOSCOPY (EGD);  Surgeon: Lin Landsman, MD;  Location: [facility];  Service: Gastroenterology;  Laterality: N/A;  Latex allergy sleep apnea   POLYPECTOMY  05/29/2015   Procedure: POLYPECTOMY;  Surgeon: Lucilla Lame, MD;  Location: [facility];  Service: Endoscopy;;    Family Psychiatric History: Reviewed family psychiatric history from progress note on 01/21/2018.  Family History:  Family History  Problem Relation Age of Onset   Diabetes Sister    Anxiety disorder Sister    Depression Sister    Hypertension Sister    Cirrhosis Mother    Diabetes Mother    Cirrhosis  Father    Breast cancer Sister 89   Heart attack Brother    Alcohol abuse Brother    Drug abuse Brother    Prostate cancer Neg Hx    Kidney cancer Neg Hx    Bladder Cancer Neg Hx     Social History: Reviewed social history from progress note on 01/21/2018. Social History   Socioeconomic History   Marital status: Married    Spouse name: IT sales professional   Number of children: 3   Years of education: Not on file   Highest education level: GED or equivalent  Occupational History   Not on file  Tobacco Use   Smoking status: Never    Smokeless tobacco: Never  Vaping Use   Vaping Use: Never used  Substance and Sexual Activity   Alcohol use: No    Alcohol/week: 0.0 standard drinks   Drug use: No   Sexual activity: Not Currently    Partners: Male  Other Topics Concern   Not on file  Social History Narrative   Lives with husband and one daughter other kids are out of the house   She does not work, husband on disability    Social Determinants of Radio broadcast assistant Strain: Not on file  Food Insecurity: Not on file  Transportation Needs: Not on file  Physical Activity: Not on file  Stress: Not on file  Social Connections: Not on file    Allergies:  Allergies  Allergen Reactions   Latex Swelling    Pt unsure of reaction pt states something happened while in the hospital after surgery.    Metabolic Disorder Labs: Lab Results  Component Value Date   HGBA1C 6.8 (H) 08/06/2021   MPG 148 08/06/2021   MPG 162.81 08/28/2019   No results found for: PROLACTIN Lab Results  Component Value Date   CHOL 165 03/12/2021   TRIG 278 (H) 03/12/2021   HDL 40 (L) 03/12/2021   CHOLHDL 4.1 03/12/2021   VLDL 44 (H) 02/12/2017   LDLCALC 88 03/12/2021   LDLCALC 127 (H) 08/10/2020   Lab Results  Component Value Date   TSH 15.48 (H) 08/06/2021   TSH 16.03 (H) 03/12/2021    Therapeutic Level Labs: No results found for: LITHIUM No results found for: VALPROATE No components found for:  CBMZ  Current Medications: Current Outpatient Medications  Medication Sig Dispense Refill   acetaminophen (TYLENOL) 500 MG tablet Take 2 tablets (1,000 mg total) by mouth every 6 (six) hours as needed. 30 tablet 0   amLODipine (NORVASC) 2.5 MG tablet TAKE 1 TABLET BY MOUTH ONCE A DAY 90 tablet 1   aspirin 81 MG EC tablet Take 81 mg by mouth daily.     benzonatate (TESSALON) 100 MG capsule Take 1 capsule (100 mg total) by mouth 3 (three) times daily as needed for cough. 40 capsule 0   cloNIDine (CATAPRES) 0.1 MG tablet TAKE 1  TABLET BY MOUTH TWICE A DAY 180 tablet 1   docusate sodium (COLACE) 100 MG capsule Take 100 mg by mouth daily.     estradiol (ESTRACE VAGINAL) 0.1 MG/GM vaginal cream Apply 0.5mg  (pea-sized amount)  just inside the vaginal introitus with a finger-tip on Monday, Wednesday and Friday nights. 30 g 12   fluticasone (FLONASE) 50 MCG/ACT nasal spray PLACE 2 SPRAYS INTO BOTH NOSTRILS DAILY AS NEEDED FOR ALLERGIES 48 mL 0   fluticasone (FLOVENT HFA) 44 MCG/ACT inhaler Inhale 2 puffs into the lungs 2 (two) times daily. In place of  Asmanex 1 each 2   gabapentin (NEURONTIN) 400 MG capsule 1 capsule in the morning and at midday, 2 in the evening 360 capsule 1   Galcanezumab-gnlm (EMGALITY) 120 MG/ML SOAJ Inject 120 mg into the skin every 30 (thirty) days. 1 mL 11   glucose blood test strip Use as instructed 100 each 12   hydrOXYzine (ATARAX/VISTARIL) 25 MG tablet TAKE 1 TABLET BY MOUTH EVERY 8 HOURS AS NEEDED 30 tablet 0   ketoconazole (NIZORAL) 2 % cream Apply 1 application topically daily. 60 g 0   Lancet Devices (ONE TOUCH DELICA LANCING DEV) MISC 1 each by Does not apply route daily. 1 each 0   levothyroxine (SYNTHROID) 137 MCG tablet Take 1 tablet (137 mcg total) by mouth daily. 30 tablet 1   LINZESS 145 MCG CAPS capsule Take 145 mcg by mouth daily.     metFORMIN (GLUCOPHAGE-XR) 750 MG 24 hr tablet TAKE 2 TABLETS BY MOUTH ONCE A DAY WITH BREAKFAST 180 tablet 1   metoprolol succinate (TOPROL-XL) 50 MG 24 hr tablet TAKE 1 TABLET BY MOUTH ONCE A DAY WITH OR IMMEDIATELY FOLLOWING A MEAL. 90 tablet 1   mirtazapine (REMERON) 7.5 MG tablet Take 1 tablet (7.5 mg total) by mouth at bedtime. 30 tablet 1   montelukast (SINGULAIR) 10 MG tablet Take 1 tablet (10 mg total) by mouth at bedtime. 90 tablet 3   nitroGLYCERIN (NITROSTAT) 0.4 MG SL tablet Place 1 tablet (0.4 mg total) under the tongue every 5 (five) minutes as needed. 25 tablet 0   olmesartan-hydrochlorothiazide (BENICAR HCT) 40-25 MG tablet Take 1 tablet by  mouth daily. 90 tablet 1   omeprazole (PRILOSEC) 40 MG capsule TAKE 1 CAPSULE BY MOUTH ONCE A DAY 90 capsule 1   ondansetron (ZOFRAN) 4 MG tablet Take 1 tablet (4 mg total) by mouth every 8 (eight) hours as needed for nausea or vomiting. 15 tablet 0   OneTouch Delica Lancets 48J MISC 1 each by Does not apply route daily. 100 each 2   oxybutynin (DITROPAN XL) 15 MG 24 hr tablet Take 1 tablet (15 mg total) by mouth daily. 90 tablet 3   pioglitazone (ACTOS) 15 MG tablet Take 1 tablet (15 mg total) by mouth daily. 90 tablet 1   polyethylene glycol (MIRALAX) 17 g packet Take 17 g by mouth daily as needed for mild constipation. 14 each 0   rizatriptan (MAXALT) 10 MG tablet May repeat in 2 hours if needed 24 tablet 0   rosuvastatin (CRESTOR) 40 MG tablet Take 1 tablet (40 mg total) by mouth daily. In place of Atorvastatin 90 tablet 3   senna (SENOKOT) 8.6 MG TABS tablet Take 1 tablet by mouth daily.     umeclidinium-vilanterol (ANORO ELLIPTA) 62.5-25 MCG/ACT AEPB Inhale 1 puff into the lungs daily at 6 (six) AM. 1 each 0   VENTOLIN HFA 108 (90 Base) MCG/ACT inhaler INHALE 2 PUFFS BY MOUTH EVERY 6 HOURS AS NEEDED FOR WHEEZING OR SHORTNESS OF BREATH 2 each 0   No current facility-administered medications for this visit.     Musculoskeletal: Strength & Muscle Tone:  UTA Gait & Station: normal Patient leans: N/A  Psychiatric Specialty Exam: Review of Systems  Psychiatric/Behavioral:  Positive for dysphoric mood. The patient is nervous/anxious.   All other systems reviewed and are negative.  Blood pressure (!) 144/79, pulse 94, temperature 98.6 F (37 C), temperature source Temporal, weight 221 lb (100.2 kg).Body mass index is 41.42 kg/m.  General Appearance: Casual  Eye Contact:  Fair  Speech:  Normal Rate  Volume:  Normal  Mood:  Anxious and Dysphoric  Affect:  Congruent  Thought Process:  Goal Directed and Descriptions of Associations: Intact  Orientation:  Full (Time, Place, and Person)   Thought Content: Logical   Suicidal Thoughts:  No  Homicidal Thoughts:  No  Memory:  Immediate;   Fair Recent;   Fair Remote;   Limited, reports short term memory loss  Judgement:  Fair  Insight:  Fair  Psychomotor Activity:  Normal  Concentration:  Concentration: Fair and Attention Span: Fair  Recall:  [employer] of Knowledge: Fair  Language: Fair  Akathisia:  No  Handed:  Right  AIMS (if indicated): done, 0  Assets:  Communication Skills Desire for Improvement Social Support  ADL's:  Intact  Cognition: WNL  Sleep:  Fair   Screenings: GAD-7    Flowsheet Row Office Visit from 03/19/2018 in [facility] Office Visit from 03/04/2016 in [facility] Office Visit from 12/20/2015 in [facility]  Total GAD-7 Score 14 18 18       PHQ2-9    [place] Office Visit from 08/27/2021 in [facility] Video Visit from 08/06/2021 in [facility] Office Visit from 08/02/2021 in [facility] Office Visit from 07/19/2021 in [facility] Office Visit from 05/23/2021 in [facility]  PHQ-2 Total Score 4 2 3 2 6   PHQ-9 Total Score 10 4 14 10 11       [place] Visit from 08/27/2021 in [facility] Office Visit from 08/02/2021 in [facility] Office Visit from 07/19/2021 in [facility] No Risk No Risk No Risk        Assessment and Plan: Beverly Barrett is a 61 year old African-American female, married, lives in [place], has a history of MDD, GAD, insomnia, OSA on CPAP, migraine headaches, hypothyroidism, arthritis was evaluated in office today.  Patient with short-term memory problems, currently reports improvement in her mood on the current medication regimen.  She will benefit from following  plan.  Plan MDD-improving Mirtazapine 7.5 mg p.o. nightly  GAD-improving Mirtazapine 7.5 mg p.o. nightly  Sleep disorder-improving Encouraged compliance on CPAP for OSA Mirtazapine 7.5 mg p.o. nightly  Memory problems-unstable MMSE completed today-27 out of 30 Patient with hypothyroidism, review TSH level and discussed with patient dated 08/06/2021-elevated at 15.48.  Patient to be compliant on her thyroid medication, provided education. Patient could also follow up with neurology for further evaluation and management.  She already has neurology appointment.  Follow-up in clinic in 2 months or sooner in person.  This note was generated in part or whole with voice recognition software. Voice recognition is usually quite accurate but there are transcription errors that can and very often do occur. I apologize for any typographical errors that were not detected and corrected.      Ursula Alert, MD 08/27/2021, 1:02 PM

## 2021-09-02 ENCOUNTER — Other Ambulatory Visit: Payer: Self-pay | Admitting: Family Medicine

## 2021-09-02 DIAGNOSIS — R051 Acute cough: Secondary | ICD-10-CM

## 2021-09-03 NOTE — Telephone Encounter (Signed)
Requested medications are due for refill today.  unsure  Requested medications are on the active medications list.  no  Last refill. 05/23/2021 for both  Future visit scheduled.   no  Notes to clinic.  Medications are not assigned a protocol. Please review.    Requested Prescriptions  Pending Prescriptions Disp Refills   GAVILYTE-G 236 g solution [Pharmacy Med Name: GaviLyte-G 236 GM Oral Solution Reconstituted] 4,000 mL 0    Sig: [place]. DRINK 8 OUNCES EVERY 30 MINUTES UNTIL GONE     Off-Protocol Failed - 09/02/2021  6:52 PM      Failed - Medication not assigned to a protocol, review manually.      Passed - Valid encounter within last 12 months    Recent Outpatient Visits           4 weeks ago Acute cough   [facility] Steele Sizer, MD   3 months ago [facility] Rory Percy M, DO   4 months ago Nausea   [facility] Rory Percy M, DO   5 months ago Dyslipidemia associated with type 2 diabetes mellitus [facility])   [facility] [place], Drue Stager, MD   5 months ago Non-intractable vomiting with nausea, unspecified vomiting type   [facility] Steele Sizer, MD       Future Appointments             In 1 month Vanga, Tally Due, MD [facility] [place]             nitrofurantoin, macrocrystal-monohydrate, (MACROBID) 100 MG capsule [Pharmacy Med Name: Nitrofurantoin Monohyd Macro 100 MG Oral Capsule] 10 capsule 0    Sig: Take 1 capsule by mouth twice daily for 5 days     Off-Protocol Failed - 09/02/2021  6:52 PM      Failed - Medication not assigned to a protocol, review manually.      Passed - Valid encounter within last 12 months    Recent Outpatient Visits           4 weeks ago Acute cough   [facility] Steele Sizer, MD   3 months ago [facility] Rory Percy M, DO   4  months ago Nausea   [facility] Rory Percy M, DO   5 months ago Dyslipidemia associated with type 2 diabetes mellitus [facility]   [facility] Steele Sizer, MD   5 months ago Non-intractable vomiting with nausea, unspecified vomiting type   [facility] Steele Sizer, MD       Future Appointments             In 1 month Vanga, Tally Due, MD [place]

## 2021-09-05 ENCOUNTER — Telehealth: Payer: Self-pay | Admitting: *Deleted

## 2021-09-05 NOTE — Telephone Encounter (Signed)
PA for Emgality started on covermymeds (key: [id number]). Pt has new pharmacy coverage through [employer] [phone]). Decision pending.

## 2021-09-10 ENCOUNTER — Other Ambulatory Visit: Payer: Self-pay | Admitting: Family Medicine

## 2021-09-10 DIAGNOSIS — J4520 Mild intermittent asthma, uncomplicated: Secondary | ICD-10-CM

## 2021-09-11 NOTE — Telephone Encounter (Signed)
Spoke with Jenny Reichmann at [facility] (Phone #: [phone]). Was transferred to a different department due to [facility] being an injection. Waited for response, ended call due to extended wait time. WCB.

## 2021-09-12 NOTE — Telephone Encounter (Signed)
Spoke with Yvone Neu at [facility]. He stated the PA for Emgality is still open. It is through the clinical approval process and under medical director review. Yvone Neu stated it should be done by the end of the day today.

## 2021-09-17 NOTE — Telephone Encounter (Addendum)
I called [facility] [phone]) and spoke to rep, Diamond. Apparently, the case was processed incorrectly and denied. The rep has entered a new case for expedited review. Patient GF#[id number]. FVWA#[id number] pending.

## 2021-09-18 NOTE — Telephone Encounter (Signed)
I spoke to the patient. She also had her daughter, Linsie Lupo, on speaker phone. She would like to get the Botox treatment authorized by her insurance company. In the meantime, she is going to think about it and make her final decision. Once it is approved, she would like a call back so she can confirm whether on not she wants to move forward.

## 2021-09-18 NOTE — Telephone Encounter (Addendum)
I spoke to rep, Tine C, at [facility]. The case for Emgality has been denied. Botox is actually the preferred treatment on her plan. Criteria requires six months of Botox tried.  Judson Roch actually discussed this with the patient at her last visit. Per vo by Judson Roch, okay to proceed with the process of getting Botox approved.   Left a message for the patient to call me back. We need to confirm she agrees with moving forward with Botox treatment for her migraines.

## 2021-09-18 NOTE — Addendum Note (Signed)
Addended by: Desmond Lope on: 09/18/2021 02:18 PM   Modules accepted: Orders

## 2021-09-20 ENCOUNTER — Telehealth: Payer: Self-pay | Admitting: Neurology

## 2021-09-20 NOTE — Telephone Encounter (Addendum)
Received charge sheet for Botox [id number] and [id number], called [facility] is required for the CPT codes. I completed the [employer] PA form for Botox, placed in Nurse Pod for MD signature.

## 2021-09-26 NOTE — Telephone Encounter (Signed)
Faxed completed [employer] Botox form with OV note to [place]. ?

## 2021-09-28 ENCOUNTER — Encounter: Payer: Self-pay | Admitting: Family Medicine

## 2021-10-01 ENCOUNTER — Other Ambulatory Visit: Payer: Self-pay

## 2021-10-01 DIAGNOSIS — J4540 Moderate persistent asthma, uncomplicated: Secondary | ICD-10-CM

## 2021-10-01 DIAGNOSIS — E1169 Type 2 diabetes mellitus with other specified complication: Secondary | ICD-10-CM

## 2021-10-01 DIAGNOSIS — F411 Generalized anxiety disorder: Secondary | ICD-10-CM

## 2021-10-01 DIAGNOSIS — R232 Flushing: Secondary | ICD-10-CM

## 2021-10-01 DIAGNOSIS — E034 Atrophy of thyroid (acquired): Secondary | ICD-10-CM

## 2021-10-01 DIAGNOSIS — J4520 Mild intermittent asthma, uncomplicated: Secondary | ICD-10-CM

## 2021-10-01 DIAGNOSIS — I152 Hypertension secondary to endocrine disorders: Secondary | ICD-10-CM

## 2021-10-01 DIAGNOSIS — E1159 Type 2 diabetes mellitus with other circulatory complications: Secondary | ICD-10-CM

## 2021-10-01 DIAGNOSIS — J4521 Mild intermittent asthma with (acute) exacerbation: Secondary | ICD-10-CM

## 2021-10-01 NOTE — Telephone Encounter (Signed)
Awaiting return call to clarify with patient  ?

## 2021-10-01 NOTE — Telephone Encounter (Signed)
Left voicemail for patient to verify which pharmacies she would like to use for which medications.  ?

## 2021-10-02 NOTE — Telephone Encounter (Signed)
Lvm for pt to call and schedule an appt  
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
## 2021-10-03 ENCOUNTER — Ambulatory Visit (INDEPENDENT_AMBULATORY_CARE_PROVIDER_SITE_OTHER): Payer: Managed Care, Other (non HMO) | Admitting: Family Medicine

## 2021-10-03 ENCOUNTER — Encounter: Payer: Self-pay | Admitting: Family Medicine

## 2021-10-03 VITALS — BP 152/102 | HR 92 | Resp 16 | Ht 61.0 in | Wt 223.0 lb

## 2021-10-03 DIAGNOSIS — K219 Gastro-esophageal reflux disease without esophagitis: Secondary | ICD-10-CM

## 2021-10-03 DIAGNOSIS — I209 Angina pectoris, unspecified: Secondary | ICD-10-CM

## 2021-10-03 DIAGNOSIS — G43009 Migraine without aura, not intractable, without status migrainosus: Secondary | ICD-10-CM

## 2021-10-03 DIAGNOSIS — E1159 Type 2 diabetes mellitus with other circulatory complications: Secondary | ICD-10-CM | POA: Diagnosis not present

## 2021-10-03 DIAGNOSIS — R232 Flushing: Secondary | ICD-10-CM

## 2021-10-03 DIAGNOSIS — K5909 Other constipation: Secondary | ICD-10-CM

## 2021-10-03 DIAGNOSIS — I152 Hypertension secondary to endocrine disorders: Secondary | ICD-10-CM

## 2021-10-03 DIAGNOSIS — Z9989 Dependence on other enabling machines and devices: Secondary | ICD-10-CM

## 2021-10-03 DIAGNOSIS — L304 Erythema intertrigo: Secondary | ICD-10-CM

## 2021-10-03 DIAGNOSIS — F330 Major depressive disorder, recurrent, mild: Secondary | ICD-10-CM

## 2021-10-03 DIAGNOSIS — B353 Tinea pedis: Secondary | ICD-10-CM

## 2021-10-03 DIAGNOSIS — E1169 Type 2 diabetes mellitus with other specified complication: Secondary | ICD-10-CM

## 2021-10-03 DIAGNOSIS — J4520 Mild intermittent asthma, uncomplicated: Secondary | ICD-10-CM

## 2021-10-03 DIAGNOSIS — G4733 Obstructive sleep apnea (adult) (pediatric): Secondary | ICD-10-CM

## 2021-10-03 DIAGNOSIS — E785 Hyperlipidemia, unspecified: Secondary | ICD-10-CM

## 2021-10-03 DIAGNOSIS — J4540 Moderate persistent asthma, uncomplicated: Secondary | ICD-10-CM

## 2021-10-03 MED ORDER — FLUTICASONE PROPIONATE 50 MCG/ACT NA SUSP: 2.0000 | Freq: Every day | NASAL | 0 refills | Status: DC | PRN

## 2021-10-03 MED ORDER — METFORMIN HCL ER 750 MG PO TB24: ORAL_TABLET | ORAL | 1 refills | Status: DC

## 2021-10-03 MED ORDER — KETOCONAZOLE 2 % EX CREA: 1.0000 "application " | TOPICAL_CREAM | Freq: Every day | CUTANEOUS | 0 refills | Status: DC

## 2021-10-03 MED ORDER — OMEPRAZOLE 40 MG PO CPDR: 40.0000 mg | DELAYED_RELEASE_CAPSULE | Freq: Every day | ORAL | 1 refills | Status: DC

## 2021-10-03 MED ORDER — ROSUVASTATIN CALCIUM 40 MG PO TABS: 40.0000 mg | ORAL_TABLET | Freq: Every day | ORAL | 3 refills | Status: DC

## 2021-10-03 MED ORDER — FLUTICASONE PROPIONATE HFA 44 MCG/ACT IN AERO: 2.0000 | INHALATION_SPRAY | Freq: Two times a day (BID) | RESPIRATORY_TRACT | 1 refills | Status: DC

## 2021-10-03 MED ORDER — AMLODIPINE BESYLATE 2.5 MG PO TABS: 2.5000 mg | ORAL_TABLET | Freq: Every day | ORAL | 1 refills | Status: DC

## 2021-10-03 MED ORDER — ALBUTEROL SULFATE HFA 108 (90 BASE) MCG/ACT IN AERS: INHALATION_SPRAY | RESPIRATORY_TRACT | 0 refills | Status: DC

## 2021-10-03 MED ORDER — OLMESARTAN MEDOXOMIL-HCTZ 40-25 MG PO TABS: 1.0000 | ORAL_TABLET | Freq: Every day | ORAL | 1 refills | Status: DC

## 2021-10-03 MED ORDER — MONTELUKAST SODIUM 10 MG PO TABS: 10.0000 mg | ORAL_TABLET | Freq: Every day | ORAL | 1 refills | Status: DC

## 2021-10-03 MED ORDER — CLONIDINE HCL 0.1 MG PO TABS: 0.1000 mg | ORAL_TABLET | Freq: Two times a day (BID) | ORAL | 1 refills | Status: DC

## 2021-10-03 MED ORDER — METOPROLOL SUCCINATE ER 50 MG PO TB24: ORAL_TABLET | ORAL | 1 refills | Status: DC

## 2021-10-03 MED ORDER — PIOGLITAZONE HCL 15 MG PO TABS: 15.0000 mg | ORAL_TABLET | Freq: Every day | ORAL | 1 refills | Status: DC

## 2021-10-03 MED ORDER — LINZESS 145 MCG PO CAPS: 145.0000 ug | ORAL_CAPSULE | Freq: Every day | ORAL | 1 refills | Status: DC

## 2021-10-03 MED ORDER — CLONIDINE HCL 0.1 MG PO TABS: 0.1000 mg | ORAL_TABLET | Freq: Two times a day (BID) | ORAL | 0 refills | Status: DC

## 2021-10-03 MED ORDER — OLMESARTAN MEDOXOMIL-HCTZ 40-25 MG PO TABS: 1.0000 | ORAL_TABLET | Freq: Every day | ORAL | 0 refills | Status: DC

## 2021-10-04 LAB — TSH: TSH: 0.03 mIU/L — ABNORMAL LOW (ref 0.40–4.50)

## 2021-10-07 ENCOUNTER — Other Ambulatory Visit: Payer: Self-pay | Admitting: Family Medicine

## 2021-10-07 ENCOUNTER — Other Ambulatory Visit: Payer: Self-pay | Admitting: Psychiatry

## 2021-10-07 DIAGNOSIS — F411 Generalized anxiety disorder: Secondary | ICD-10-CM

## 2021-10-07 DIAGNOSIS — G479 Sleep disorder, unspecified: Secondary | ICD-10-CM

## 2021-10-07 DIAGNOSIS — E034 Atrophy of thyroid (acquired): Secondary | ICD-10-CM

## 2021-10-07 DIAGNOSIS — F330 Major depressive disorder, recurrent, mild: Secondary | ICD-10-CM

## 2021-10-08 ENCOUNTER — Other Ambulatory Visit: Payer: Self-pay | Admitting: Family Medicine

## 2021-10-08 ENCOUNTER — Ambulatory Visit: Payer: Self-pay | Admitting: *Deleted

## 2021-10-08 MED ORDER — LEVOTHYROXINE SODIUM 125 MCG PO TABS: 125.0000 ug | ORAL_TABLET | Freq: Every day | ORAL | 1 refills | Status: DC

## 2021-10-08 NOTE — Telephone Encounter (Signed)
Reason for Disposition ? [1] Caller requesting NON-URGENT health information AND [2] PCP's office is the best resource ? ?Answer Assessment - Initial Assessment Questions ?1. REASON FOR CALL or QUESTION: "What is your reason for calling today?" or "How can I best help you?" or "What question do you have that I can help answer?" ?    Lab results given pertaining to Synthroid medication dose change.   She opts to do the 125 mcg daily. ? ?Protocols used: Information Only Call - No Triage-A-AH ? ?

## 2021-10-08 NOTE — Telephone Encounter (Signed)
Pt called in and was given her lab result message.   She prefers to take 125 mcg daily.  Message sent to practice. ?

## 2021-10-15 ENCOUNTER — Other Ambulatory Visit: Payer: 59

## 2021-10-22 ENCOUNTER — Ambulatory Visit (INDEPENDENT_AMBULATORY_CARE_PROVIDER_SITE_OTHER): Payer: Medicaid Other | Admitting: Gastroenterology

## 2021-10-22 ENCOUNTER — Encounter: Payer: Self-pay | Admitting: Gastroenterology

## 2021-10-22 ENCOUNTER — Other Ambulatory Visit: Payer: Self-pay

## 2021-10-22 VITALS — BP 127/76 | HR 81 | Temp 99.0°F | Ht 61.0 in | Wt 221.0 lb

## 2021-10-22 DIAGNOSIS — K5904 Chronic idiopathic constipation: Secondary | ICD-10-CM | POA: Diagnosis not present

## 2021-10-22 DIAGNOSIS — K219 Gastro-esophageal reflux disease without esophagitis: Secondary | ICD-10-CM | POA: Diagnosis not present

## 2021-10-22 MED ORDER — LINZESS 145 MCG PO CAPS: 145.0000 ug | ORAL_CAPSULE | Freq: Every day | ORAL | 0 refills | Status: DC

## 2021-10-22 MED ORDER — OMEPRAZOLE 40 MG PO CPDR: 40.0000 mg | DELAYED_RELEASE_CAPSULE | Freq: Every day | ORAL | 0 refills | Status: DC

## 2021-10-22 NOTE — Progress Notes (Signed)
?  ?Cephas Darby, MD ?[address]  ?[address]  ?[place], [place] [address]  ?Main: [phone]  ?Fax: [phone] ? ? ? ?Gastroenterology Consultation ? ?Referring Provider:     Steele Sizer, MD ?Primary Care Physician:  Steele Sizer, MD ?Primary Gastroenterologist:  Dr. Cephas Darby ?Reason for Consultation:     Chronic constipation, GERD ?      ? HPI:   ?Beverly Barrett is a 61 y.o. female referred by Dr. Steele Sizer, MD  for consultation & management of chronic constipation and GERD.  Patient reports that she has been experiencing severe constipation, having hard bowel movement about once a week associated with significant straining.  Scant blood on wiping.  She lost about 10 pounds intentionally.  She has been experiencing epigastric pain associated with nausea.  Patient is taking MiraLAX with no relief.  Labs revealed no evidence of anemia. ? ?Follow-up visit 06/27/2021 ?Patient is here for follow-up of constipation and GERD.  She tried Linzess 145 MCG samples which helped, currently on prescription.  Reports having bowel movements daily.  She is concerned about nausea occurring on a daily basis not associated with vomiting.  She takes omeprazole 40 mg daily for chronic GERD.  Patient is gaining weight.  Patient is accompanied by her daughter today. ? ?Follow-up visit 10/22/2021 ?Patient is here for follow-up of constipation and upper abdominal pain.  She ran out of Linzess refills due to change in her pharmacy coverage.  She is taking MiraLAX but reports irregular bowel habits associated with significant straining, abdominal bloating.  She also reports upper abdominal discomfort.  She is taking omeprazole 40 mg daily. ? ?NSAIDs: None ? ?Antiplts/Anticoagulants/Anti thrombotics: None ? ?Patient does not smoke or drink alcohol.  She denies family history of GU malignancy ? ?GI Procedures:  ?Upper endoscopy 04/23/2017 ?- Normal duodenal bulb and second portion of the duodenum. ?- A single  gastric polyp. Resected and retrieved. ?- Erythematous mucosa in the gastric body. Biopsied. ?- Normal gastroesophageal junction and esophagus. ? ? ? ?Colonoscopy 05/29/2015 ?- Two 2 to 3 mm polyps in the sigmoid colon. Resected and retrieved. ?- Diverticulosis in the entire examined colon. ?- Non-bleeding internal hemorrhoids. ?Colon, polyp(s), sigmoid ?- SMALL AMOUNTS OF FRAGMENTED SUPERFICIAL BENIGN COLONIC EPITHELIUM. ?- NO INTACT COLONIC MUCOSA OR POLYPOID ARCHITECTURE IDENTIFIED. ?- NO ADENOMATOUS CHANGE OR MALIGNANCY IDENTIFIED. ? ?Past Medical History:  ?Diagnosis Date  ? Anginal pain ([place])   ? none in approx 2 yrs  ? Anxiety   ? Asthma   ? Chronic lower back pain   ? Common migraine with intractable migraine 09/20/2016  ? Depression   ? suicidal ideation  ? Diabetes mellitus without complication ([place])   ? GERD (gastroesophageal reflux disease)   ? Headache   ? migraines -3x/wk  ? Hyperlipidemia   ? Hypertension   ? Hypothyroidism   ? Motion sickness   ? cars  ? Multilevel degenerative disc disease   ? Shortness of breath dyspnea   ? Sleep apnea   ? CPAP  ? Vertigo   ? Wears dentures   ? partial upper  ? ? ?Past Surgical History:  ?Procedure Laterality Date  ? ABDOMINAL HYSTERECTOMY    ? bladder tach  1995  ? CESAREAN SECTION    ? COLONOSCOPY WITH PROPOFOL N/A 05/29/2015  ? Procedure: COLONOSCOPY WITH PROPOFOL;  Surgeon: Lucilla Lame, MD;  Location: [facility];  Service: Endoscopy;  Laterality: N/A;  Latex allergy ?sleep apnea - no CPAP machine (yet)  ?  ESOPHAGOGASTRODUODENOSCOPY N/A 04/23/2017  ? Procedure: ESOPHAGOGASTRODUODENOSCOPY (EGD);  Surgeon: Lin Landsman, MD;  Location: [facility];  Service: Gastroenterology;  Laterality: N/A;  Latex allergy ?sleep apnea  ? POLYPECTOMY  05/29/2015  ? Procedure: POLYPECTOMY;  Surgeon: Lucilla Lame, MD;  Location: [facility];  Service: Endoscopy;;  ? ?Current Outpatient Medications:  ?  acetaminophen (TYLENOL) 500 MG tablet, Take 2  tablets (1,000 mg total) by mouth every 6 (six) hours as needed., Disp: 30 tablet, Rfl: 0 ?  albuterol (VENTOLIN HFA) 108 (90 Base) MCG/ACT inhaler, INHALE 2 PUFFS BY MOUTH EVERY 6 HOURS AS NEEDED FOR WHEEZING OR SHORTNESS OF BREATH, Disp: 2 each, Rfl: 0 ?  amLODipine (NORVASC) 2.5 MG tablet, Take 1 tablet (2.5 mg total) by mouth daily., Disp: 90 tablet, Rfl: 1 ?  aspirin 81 MG EC tablet, Take 81 mg by mouth daily., Disp: , Rfl:  ?  cloNIDine (CATAPRES) 0.1 MG tablet, Take 1 tablet (0.1 mg total) by mouth 2 (two) times daily., Disp: 180 tablet, Rfl: 1 ?  estradiol (ESTRACE VAGINAL) 0.1 MG/GM vaginal cream, Apply 0.'5mg'$  (pea-sized amount)  just inside the vaginal introitus with a finger-tip on Monday, Wednesday and Friday nights., Disp: 30 g, Rfl: 12 ?  fluticasone (FLONASE) 50 MCG/ACT nasal spray, Place 2 sprays into both nostrils daily as needed for allergies., Disp: 48 mL, Rfl: 0 ?  fluticasone (FLOVENT HFA) 44 MCG/ACT inhaler, Inhale 2 puffs into the lungs 2 (two) times daily., Disp: 3 each, Rfl: 1 ?  gabapentin (NEURONTIN) 400 MG capsule, 1 capsule in the morning and at midday, 2 in the evening, Disp: 360 capsule, Rfl: 1 ?  glucose blood test strip, Use as instructed, Disp: 100 each, Rfl: 12 ?  ketoconazole (NIZORAL) 2 % cream, Apply 1 application. topically daily., Disp: 60 g, Rfl: 0 ?  Lancet Devices (ONE TOUCH DELICA LANCING DEV) MISC, 1 each by Does not apply route daily., Disp: 1 each, Rfl: 0 ?  levothyroxine (SYNTHROID) 125 MCG tablet, Take 1 tablet (125 mcg total) by mouth daily., Disp: 30 tablet, Rfl: 1 ?  metFORMIN (GLUCOPHAGE-XR) 750 MG 24 hr tablet, TAKE 2 TABLETS BY MOUTH ONCE A DAY WITH BREAKFAST, Disp: 180 tablet, Rfl: 1 ?  metoprolol succinate (TOPROL-XL) 50 MG 24 hr tablet, TAKE 1 TABLET BY MOUTH ONCE A DAY WITH OR IMMEDIATELY FOLLOWING A MEAL., Disp: 90 tablet, Rfl: 1 ?  mirtazapine (REMERON) 7.5 MG tablet, TAKE 1 TABLET BY MOUTH AT BEDTIME, Disp: 30 tablet, Rfl: 1 ?  montelukast (SINGULAIR) 10  MG tablet, Take 1 tablet (10 mg total) by mouth at bedtime., Disp: 90 tablet, Rfl: 1 ?  nitroGLYCERIN (NITROSTAT) 0.4 MG SL tablet, Place 1 tablet (0.4 mg total) under the tongue every 5 (five) minutes as needed., Disp: 25 tablet, Rfl: 0 ?  olmesartan-hydrochlorothiazide (BENICAR HCT) 40-25 MG tablet, Take 1 tablet by mouth daily., Disp: 90 tablet, Rfl: 1 ?  OneTouch Delica Lancets 91B MISC, 1 each by Does not apply route daily., Disp: 100 each, Rfl: 2 ?  oxybutynin (DITROPAN XL) 15 MG 24 hr tablet, Take 1 tablet (15 mg total) by mouth daily., Disp: 90 tablet, Rfl: 3 ?  pioglitazone (ACTOS) 15 MG tablet, Take 1 tablet (15 mg total) by mouth daily., Disp: 90 tablet, Rfl: 1 ?  polyethylene glycol (MIRALAX) 17 g packet, Take 17 g by mouth daily as needed for mild constipation., Disp: 14 each, Rfl: 0 ?  rizatriptan (MAXALT) 10 MG tablet, May repeat in 2 hours  if needed, Disp: 24 tablet, Rfl: 0 ?  rosuvastatin (CRESTOR) 40 MG tablet, Take 1 tablet (40 mg total) by mouth daily. In place of Atorvastatin, Disp: 90 tablet, Rfl: 3 ?  senna (SENOKOT) 8.6 MG TABS tablet, Take 1 tablet by mouth daily., Disp: , Rfl:  ?  LINZESS 145 MCG CAPS capsule, Take 1 capsule (145 mcg total) by mouth daily., Disp: 90 capsule, Rfl: 0 ?  omeprazole (PRILOSEC) 40 MG capsule, Take 1 capsule (40 mg total) by mouth daily., Disp: 90 capsule, Rfl: 0 ? ? ? ?Family History  ?Problem Relation Age of Onset  ? Diabetes Sister   ? Anxiety disorder Sister   ? Depression Sister   ? Hypertension Sister   ? Cirrhosis Mother   ? Diabetes Mother   ? Cirrhosis Father   ? Breast cancer Sister 61  ? Heart attack Brother   ? Alcohol abuse Brother   ? Drug abuse Brother   ? Prostate cancer Neg Hx   ? Kidney cancer Neg Hx   ? Bladder Cancer Neg Hx   ?  ? ?Social History  ? ?Tobacco Use  ? Smoking status: Never  ? Smokeless tobacco: Never  ?Vaping Use  ? Vaping Use: Never used  ?Substance Use Topics  ? Alcohol use: No  ?  Alcohol/week: 0.0 standard drinks  ? Drug use:  No  ? ? ?Allergies as of 10/22/2021 - Review Complete 10/22/2021  ?Allergen Reaction Noted  ? Latex Swelling 04/24/2015  ? ? ?Review of Systems:    ?All systems reviewed and negative except where noted in [facility]

## 2021-10-24 ENCOUNTER — Encounter: Payer: Self-pay | Admitting: Psychiatry

## 2021-10-24 ENCOUNTER — Ambulatory Visit (INDEPENDENT_AMBULATORY_CARE_PROVIDER_SITE_OTHER): Payer: 59 | Admitting: Psychiatry

## 2021-10-24 ENCOUNTER — Other Ambulatory Visit: Payer: Self-pay

## 2021-10-24 VITALS — BP 125/83 | HR 74 | Ht 61.0 in | Wt 221.0 lb

## 2021-10-24 DIAGNOSIS — G479 Sleep disorder, unspecified: Secondary | ICD-10-CM

## 2021-10-24 DIAGNOSIS — R413 Other amnesia: Secondary | ICD-10-CM | POA: Diagnosis not present

## 2021-10-24 DIAGNOSIS — F411 Generalized anxiety disorder: Secondary | ICD-10-CM

## 2021-10-24 DIAGNOSIS — F3341 Major depressive disorder, recurrent, in partial remission: Secondary | ICD-10-CM | POA: Diagnosis not present

## 2021-10-24 NOTE — Progress Notes (Signed)
[facility] MD OP Progress Note ? ?10/24/2021 12:41 PM ?Beverly Barrett  ?MRN:  [id number] ? ?Chief Complaint:  ?Chief Complaint  ?Patient presents with  ? Follow-up: 61 year old African-American female who has a history of MDD, GAD, OSA on CPAP, hypothyroidism, presented for medication management.  ? ?HPI: Beverly Barrett is a 61 year old African-American female, married, lives in [place], has a history of MDD, GAD, chronic pain, arthritis, history of OSA on CPAP, hypothyroidism, migraine headaches was evaluated in office today. ? ?Patient today reports overall mood symptoms as improving.  Reports she does have days when she feels down however she has been coping better.  Patient does struggle with her energy level some days.  Reports sleep has improved on the mirtazapine.  She reports she is eating enough for herself.  Denies any changes in her appetite. ? ?Patient continues to be worried about her situational stressors including her home that needs repair.  That does make her anxious. ? ?Patient reports she has not noticed much changes in her memory, does have some short-term memory problems otherwise doing fairly well.  Appeared to be alert and oriented to person place time situation. ? ?Denies any suicidality, homicidality or perceptual disturbances. ? ?Patient denies any other concerns today. ? ?Visit Diagnosis:  ?  ICD-10-CM   ?1. MDD (Barrett depressive disorder), recurrent, in partial remission ([place])  F33.41   ?  ?2. GAD (generalized anxiety disorder)  F41.1   ?  ?3. Sleep disorder  G47.9   ? OSA,mood  ?  ?4. Memory loss  R41.3   ?  ? ? ?Past Psychiatric History: Reviewed past psychiatric history from progress note on 01/21/2018. ? ?Past Medical History:  ?Past Medical History:  ?Diagnosis Date  ? Anginal pain ([place])   ? none in approx 2 yrs  ? Anxiety   ? Asthma   ? Chronic lower back pain   ? Common migraine with intractable migraine 09/20/2016  ? Depression   ? suicidal ideation  ? Diabetes mellitus without  complication ([place])   ? GERD (gastroesophageal reflux disease)   ? Headache   ? migraines -3x/wk  ? Hyperlipidemia   ? Hypertension   ? Hypothyroidism   ? Motion sickness   ? cars  ? Multilevel degenerative disc disease   ? Shortness of breath dyspnea   ? Sleep apnea   ? CPAP  ? Vertigo   ? Wears dentures   ? partial upper  ?  ?Past Surgical History:  ?Procedure Laterality Date  ? ABDOMINAL HYSTERECTOMY    ? bladder tach  1995  ? CESAREAN SECTION    ? COLONOSCOPY WITH PROPOFOL N/A 05/29/2015  ? Procedure: COLONOSCOPY WITH PROPOFOL;  Surgeon: Lucilla Lame, MD;  Location: [facility];  Service: Endoscopy;  Laterality: N/A;  Latex allergy ?sleep apnea - no CPAP machine (yet)  ? ESOPHAGOGASTRODUODENOSCOPY N/A 04/23/2017  ? Procedure: ESOPHAGOGASTRODUODENOSCOPY (EGD);  Surgeon: Lin Landsman, MD;  Location: [facility];  Service: Gastroenterology;  Laterality: N/A;  Latex allergy ?sleep apnea  ? POLYPECTOMY  05/29/2015  ? Procedure: POLYPECTOMY;  Surgeon: Lucilla Lame, MD;  Location: [facility];  Service: Endoscopy;;  ? ? ?Family Psychiatric History: Reviewed family psychiatric history from progress note on 01/21/2018. ? ?Family History:  ?Family History  ?Problem Relation Age of Onset  ? Diabetes Sister   ? Anxiety disorder Sister   ? Depression Sister   ? Hypertension Sister   ? Cirrhosis Mother   ? Diabetes Mother   ? Cirrhosis Father   ?  Breast cancer Sister 81  ? Heart attack Brother   ? Alcohol abuse Brother   ? Drug abuse Brother   ? Prostate cancer Neg Hx   ? Kidney cancer Neg Hx   ? Bladder Cancer Neg Hx   ? ? ?Social History: Reviewed social history from progress note on 01/21/2018. ?Social History  ? ?Socioeconomic History  ? Marital status: Married  ?  Spouse name: clayton sr  ? Number of children: 3  ? Years of education: Not on file  ? Highest education level: GED or equivalent  ?Occupational History  ? Not on file  ?Tobacco Use  ? Smoking status: Never  ? Smokeless tobacco: Never   ?Vaping Use  ? Vaping Use: Never used  ?Substance and Sexual Activity  ? Alcohol use: No  ?  Alcohol/week: 0.0 standard drinks  ? Drug use: No  ? Sexual activity: Not Currently  ?  Partners: Male  ?Other Topics Concern  ? Not on file  ?Social History Narrative  ? Lives with husband and one daughter other kids are out of the house  ? She does not work, husband on disability   ? ?Social Determinants of Health  ? ?Financial Resource Strain: Not on file  ?Food Insecurity: Not on file  ?Transportation Needs: Not on file  ?Physical Activity: Not on file  ?Stress: Not on file  ?Social Connections: Not on file  ? ? ?Allergies:  ?Allergies  ?Allergen Reactions  ? Latex Swelling  ?  Pt unsure of reaction pt states something happened while in the hospital after surgery.  ? ? ?Metabolic Disorder Labs: ?Lab Results  ?Component Value Date  ? HGBA1C 6.8 (H) 08/06/2021  ? MPG 148 08/06/2021  ? MPG 162.81 08/28/2019  ? ?No results found for: PROLACTIN ?Lab Results  ?Component Value Date  ? CHOL 165 03/12/2021  ? TRIG 278 (H) 03/12/2021  ? HDL 40 (L) 03/12/2021  ? CHOLHDL 4.1 03/12/2021  ? VLDL 44 (H) 02/12/2017  ? [place] 88 03/12/2021  ? LDLCALC 127 (H) 08/10/2020  ? ?Lab Results  ?Component Value Date  ? TSH 0.03 (L) 10/03/2021  ? TSH 15.48 (H) 08/06/2021  ? ? ?Therapeutic Level Labs: ?No results found for: LITHIUM ?No results found for: VALPROATE ?No components found for:  CBMZ ? ?Current Medications: ?Current Outpatient Medications  ?Medication Sig Dispense Refill  ? acetaminophen (TYLENOL) 500 MG tablet Take 2 tablets (1,000 mg total) by mouth every 6 (six) hours as needed. 30 tablet 0  ? albuterol (VENTOLIN HFA) 108 (90 Base) MCG/ACT inhaler INHALE 2 PUFFS BY MOUTH EVERY 6 HOURS AS NEEDED FOR WHEEZING OR SHORTNESS OF BREATH 2 each 0  ? amLODipine (NORVASC) 2.5 MG tablet Take 1 tablet (2.5 mg total) by mouth daily. 90 tablet 1  ? aspirin 81 MG EC tablet Take 81 mg by mouth daily.    ? cloNIDine (CATAPRES) 0.1 MG tablet Take 1  tablet (0.1 mg total) by mouth 2 (two) times daily. 180 tablet 1  ? estradiol (ESTRACE VAGINAL) 0.1 MG/GM vaginal cream Apply 0.'5mg'$  (pea-sized amount)  just inside the vaginal introitus with a finger-tip on Monday, Wednesday and Friday nights. 30 g 12  ? fluticasone (FLONASE) 50 MCG/ACT nasal spray Place 2 sprays into both nostrils daily as needed for allergies. 48 mL 0  ? fluticasone (FLOVENT HFA) 44 MCG/ACT inhaler Inhale 2 puffs into the lungs 2 (two) times daily. 3 each 1  ? gabapentin (NEURONTIN) 400 MG capsule 1 capsule in the morning  and at midday, 2 in the evening 360 capsule 1  ? glucose blood test strip Use as instructed 100 each 12  ? ketoconazole (NIZORAL) 2 % cream Apply 1 application. topically daily. 60 g 0  ? Lancet Devices (ONE TOUCH DELICA LANCING DEV) MISC 1 each by Does not apply route daily. 1 each 0  ? levothyroxine (SYNTHROID) 125 MCG tablet Take 1 tablet (125 mcg total) by mouth daily. 30 tablet 1  ? LINZESS 145 MCG CAPS capsule Take 1 capsule (145 mcg total) by mouth daily. 90 capsule 0  ? metFORMIN (GLUCOPHAGE-XR) 750 MG 24 hr tablet TAKE 2 TABLETS BY MOUTH ONCE A DAY WITH BREAKFAST 180 tablet 1  ? metoprolol succinate (TOPROL-XL) 50 MG 24 hr tablet TAKE 1 TABLET BY MOUTH ONCE A DAY WITH OR IMMEDIATELY FOLLOWING A MEAL. 90 tablet 1  ? mirtazapine (REMERON) 7.5 MG tablet TAKE 1 TABLET BY MOUTH AT BEDTIME 30 tablet 1  ? montelukast (SINGULAIR) 10 MG tablet Take 1 tablet (10 mg total) by mouth at bedtime. 90 tablet 1  ? nitroGLYCERIN (NITROSTAT) 0.4 MG SL tablet Place 1 tablet (0.4 mg total) under the tongue every 5 (five) minutes as needed. 25 tablet 0  ? olmesartan-hydrochlorothiazide (BENICAR HCT) 40-25 MG tablet Take 1 tablet by mouth daily. 90 tablet 1  ? omeprazole (PRILOSEC) 40 MG capsule Take 1 capsule (40 mg total) by mouth daily. 90 capsule 0  ? OneTouch Delica Lancets 67H MISC 1 each by Does not apply route daily. 100 each 2  ? oxybutynin (DITROPAN XL) 15 MG 24 hr tablet Take 1  tablet (15 mg total) by mouth daily. 90 tablet 3  ? pioglitazone (ACTOS) 15 MG tablet Take 1 tablet (15 mg total) by mouth daily. 90 tablet 1  ? polyethylene glycol (MIRALAX) 17 g packet Take 17 g by mouth dail

## 2021-11-05 ENCOUNTER — Encounter: Payer: Self-pay | Admitting: Family Medicine

## 2021-11-05 NOTE — Telephone Encounter (Addendum)
Received approval from [place], [place] [id number](10/15/2021-10/16/2022). Reached out to the pt and her to see if she was still interested in receiving the Botox. ? ? ?

## 2021-11-06 ENCOUNTER — Other Ambulatory Visit: Payer: Self-pay

## 2021-11-06 DIAGNOSIS — E1169 Type 2 diabetes mellitus with other specified complication: Secondary | ICD-10-CM

## 2021-11-06 DIAGNOSIS — I152 Hypertension secondary to endocrine disorders: Secondary | ICD-10-CM

## 2021-11-07 NOTE — Telephone Encounter (Signed)
Pt no longer has [employer], completed Medicaid Botox PA form, placed in Nurse Pod for MD signature. ?

## 2021-11-07 NOTE — Telephone Encounter (Signed)
Called pt to see if she is still interested in the Botox Injection. ?

## 2021-11-08 NOTE — Telephone Encounter (Signed)
Faxed signed PA form with OV notes to Medicaid. ?

## 2021-11-09 ENCOUNTER — Other Ambulatory Visit: Payer: Self-pay | Admitting: Family Medicine

## 2021-11-09 DIAGNOSIS — Z1231 Encounter for screening mammogram for malignant neoplasm of breast: Secondary | ICD-10-CM

## 2021-11-26 ENCOUNTER — Other Ambulatory Visit: Payer: Self-pay | Admitting: Urology
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
## 2021-12-03 ENCOUNTER — Encounter: Payer: Self-pay | Admitting: Family Medicine

## 2021-12-03 ENCOUNTER — Ambulatory Visit (INDEPENDENT_AMBULATORY_CARE_PROVIDER_SITE_OTHER): Payer: Commercial Managed Care - HMO | Admitting: Family Medicine

## 2021-12-03 VITALS — BP 112/78 | HR 98 | Temp 98.3°F | Resp 18 | Ht 61.0 in | Wt 218.6 lb

## 2021-12-03 DIAGNOSIS — E785 Hyperlipidemia, unspecified: Secondary | ICD-10-CM | POA: Diagnosis not present

## 2021-12-03 DIAGNOSIS — G43009 Migraine without aura, not intractable, without status migrainosus: Secondary | ICD-10-CM | POA: Insufficient documentation

## 2021-12-03 DIAGNOSIS — E1169 Type 2 diabetes mellitus with other specified complication: Secondary | ICD-10-CM | POA: Diagnosis not present

## 2021-12-03 DIAGNOSIS — G4733 Obstructive sleep apnea (adult) (pediatric): Secondary | ICD-10-CM

## 2021-12-03 DIAGNOSIS — Z6841 Body Mass Index (BMI) 40.0 and over, adult: Secondary | ICD-10-CM

## 2021-12-03 DIAGNOSIS — E1159 Type 2 diabetes mellitus with other circulatory complications: Secondary | ICD-10-CM

## 2021-12-03 DIAGNOSIS — I10 Essential (primary) hypertension: Secondary | ICD-10-CM

## 2021-12-03 DIAGNOSIS — K5904 Chronic idiopathic constipation: Secondary | ICD-10-CM | POA: Insufficient documentation

## 2021-12-03 DIAGNOSIS — F331 Major depressive disorder, recurrent, moderate: Secondary | ICD-10-CM | POA: Diagnosis not present

## 2021-12-03 DIAGNOSIS — K219 Gastro-esophageal reflux disease without esophagitis: Secondary | ICD-10-CM

## 2021-12-03 DIAGNOSIS — K5909 Other constipation: Secondary | ICD-10-CM

## 2021-12-03 DIAGNOSIS — J45909 Unspecified asthma, uncomplicated: Secondary | ICD-10-CM | POA: Insufficient documentation

## 2021-12-03 DIAGNOSIS — Z1211 Encounter for screening for malignant neoplasm of colon: Secondary | ICD-10-CM

## 2021-12-03 DIAGNOSIS — J4540 Moderate persistent asthma, uncomplicated: Secondary | ICD-10-CM

## 2021-12-03 DIAGNOSIS — Z9989 Dependence on other enabling machines and devices: Secondary | ICD-10-CM

## 2021-12-03 DIAGNOSIS — I209 Angina pectoris, unspecified: Secondary | ICD-10-CM

## 2021-12-03 DIAGNOSIS — F411 Generalized anxiety disorder: Secondary | ICD-10-CM

## 2021-12-03 DIAGNOSIS — E034 Atrophy of thyroid (acquired): Secondary | ICD-10-CM

## 2021-12-03 DIAGNOSIS — Z23 Encounter for immunization: Secondary | ICD-10-CM

## 2021-12-03 LAB — POCT GLYCOSYLATED HEMOGLOBIN (HGB A1C): Hemoglobin A1C: 6.3 % — AB (ref 4.0–5.6)

## 2021-12-03 MED ORDER — SHINGRIX 50 MCG/0.5ML IM SUSR: 0.5000 mL | Freq: Once | INTRAMUSCULAR | 0 refills | Status: AC

## 2021-12-03 NOTE — Assessment & Plan Note (Signed)
Phq 9  Is 11 today ?Continue follow up with Dr. Shea Evans  ?

## 2021-12-03 NOTE — Assessment & Plan Note (Signed)
Wearing CPAP every night  ?

## 2021-12-03 NOTE — Assessment & Plan Note (Addendum)
BMI above 40 ?Lost 5 lbs since last visit , it may be due to suppressed TSH ?She gas been walking daily  ? ?

## 2021-12-03 NOTE — Assessment & Plan Note (Signed)
Having orthostatic changes over the past two days ?Advised to stay hydrated and hold clonidine in the mornings if needed  ?

## 2021-12-03 NOTE — Assessment & Plan Note (Signed)
She has been taking gabapentin daily and also Maxalt prn  ?

## 2021-12-03 NOTE — Assessment & Plan Note (Signed)
Recheck TSH, last level was suppressed ?Taking 125 mcg daily for the past 3 months  ?

## 2021-12-04 ENCOUNTER — Other Ambulatory Visit: Payer: Self-pay | Admitting: Family Medicine

## 2021-12-04 ENCOUNTER — Telehealth: Payer: Self-pay

## 2021-12-04 ENCOUNTER — Other Ambulatory Visit: Payer: Self-pay

## 2021-12-04 ENCOUNTER — Telehealth: Payer: Self-pay | Admitting: Family Medicine

## 2021-12-04 DIAGNOSIS — Z86010 Personal history of colonic polyps: Secondary | ICD-10-CM

## 2021-12-04 LAB — TSH: TSH: 0.01 mIU/L — ABNORMAL LOW (ref 0.40–4.50)

## 2021-12-04 MED ORDER — NA SULFATE-K SULFATE-MG SULF 17.5-3.13-1.6 GM/177ML PO SOLN: 1.0000 | Freq: Once | ORAL | 0 refills | Status: AC

## 2021-12-04 MED ORDER — LEVOTHYROXINE SODIUM 112 MCG PO TABS: 112.0000 ug | ORAL_TABLET | Freq: Every day | ORAL | 3 refills | Status: DC

## 2021-12-04 NOTE — Progress Notes (Signed)
Gastroenterology Pre-Procedure Review ? ?Request Date: 12/18/2021 ?Requesting Physician: Dr. Marius Ditch ? ? ?PATIENT REVIEW QUESTIONS: The patient responded to the following health history questions as indicated:   ? ?1. Are you having any GI issues? no ?2. Do you have a personal history of Polyps? yes (last colonoscopy ) ?3. Do you have a family history of Colon Cancer or Polyps? no ?4. Diabetes Mellitus? yes (type 2 ) ?5. Joint replacements in the past 12 months?no ?6. Major health problems in the past 3 months?no ?7. Any artificial heart valves, MVP, or defibrillator?no ?   ?MEDICATIONS & ALLERGIES:    ?Patient reports the following regarding taking any anticoagulation/antiplatelet therapy:   ?Plavix, Coumadin, Eliquis, Xarelto, Lovenox, Pradaxa, Brilinta, or Effient? no ?Aspirin? yes (81 mg) ? ?Patient confirms/reports the following medications:  ?Current Outpatient Medications  ?Medication Sig Dispense Refill  ? acetaminophen (TYLENOL) 500 MG tablet Take 2 tablets (1,000 mg total) by mouth every 6 (six) hours as needed. 30 tablet 0  ? albuterol (VENTOLIN HFA) 108 (90 Base) MCG/ACT inhaler INHALE 2 PUFFS BY MOUTH EVERY 6 HOURS AS NEEDED FOR WHEEZING OR SHORTNESS OF BREATH 2 each 0  ? amLODipine (NORVASC) 2.5 MG tablet Take 1 tablet (2.5 mg total) by mouth daily. 90 tablet 1  ? aspirin 81 MG EC tablet Take 81 mg by mouth daily.    ? cloNIDine (CATAPRES) 0.1 MG tablet Take 1 tablet (0.1 mg total) by mouth 2 (two) times daily. 180 tablet 1  ? estradiol (ESTRACE VAGINAL) 0.1 MG/GM vaginal cream Apply 0.'5mg'$  (pea-sized amount)  just inside the vaginal introitus with a finger-tip on Monday, Wednesday and Friday nights. 30 g 12  ? fluticasone (FLONASE) 50 MCG/ACT nasal spray Place 2 sprays into both nostrils daily as needed for allergies. 48 mL 0  ? fluticasone (FLOVENT HFA) 44 MCG/ACT inhaler Inhale 2 puffs into the lungs 2 (two) times daily. 3 each 1  ? gabapentin (NEURONTIN) 400 MG capsule 1 capsule in the morning and at  midday, 2 in the evening 360 capsule 1  ? glucose blood test strip Use as instructed 100 each 12  ? ketoconazole (NIZORAL) 2 % cream Apply 1 application. topically daily. 60 g 0  ? Lancet Devices (ONE TOUCH DELICA LANCING DEV) MISC 1 each by Does not apply route daily. 1 each 0  ? levothyroxine (SYNTHROID) 125 MCG tablet Take 1 tablet (125 mcg total) by mouth daily. 30 tablet 1  ? LINZESS 145 MCG CAPS capsule Take 1 capsule (145 mcg total) by mouth daily. 90 capsule 0  ? metFORMIN (GLUCOPHAGE-XR) 750 MG 24 hr tablet TAKE 2 TABLETS BY MOUTH ONCE A DAY WITH BREAKFAST 180 tablet 1  ? metoprolol succinate (TOPROL-XL) 50 MG 24 hr tablet TAKE 1 TABLET BY MOUTH ONCE A DAY WITH OR IMMEDIATELY FOLLOWING A MEAL. 90 tablet 1  ? mirtazapine (REMERON) 7.5 MG tablet TAKE 1 TABLET BY MOUTH AT BEDTIME 30 tablet 1  ? montelukast (SINGULAIR) 10 MG tablet Take 1 tablet (10 mg total) by mouth at bedtime. 90 tablet 1  ? nitroGLYCERIN (NITROSTAT) 0.4 MG SL tablet Place 1 tablet (0.4 mg total) under the tongue every 5 (five) minutes as needed. 25 tablet 0  ? olmesartan-hydrochlorothiazide (BENICAR HCT) 40-25 MG tablet Take 1 tablet by mouth daily. 90 tablet 1  ? omeprazole (PRILOSEC) 40 MG capsule Take 1 capsule (40 mg total) by mouth daily. 90 capsule 0  ? OneTouch Delica Lancets 68T MISC 1 each by Does not apply route daily.  100 each 2  ? oxybutynin (DITROPAN XL) 15 MG 24 hr tablet Take 1 tablet (15 mg total) by mouth daily. 90 tablet 3  ? pioglitazone (ACTOS) 15 MG tablet Take 1 tablet (15 mg total) by mouth daily. 90 tablet 1  ? polyethylene glycol (MIRALAX) 17 g packet Take 17 g by mouth daily as needed for mild constipation. 14 each 0  ? rizatriptan (MAXALT) 10 MG tablet May repeat in 2 hours if needed 24 tablet 0  ? rosuvastatin (CRESTOR) 40 MG tablet Take 1 tablet (40 mg total) by mouth daily. In place of Atorvastatin 90 tablet 3  ? senna (SENOKOT) 8.6 MG TABS tablet Take 1 tablet by mouth daily.    ? ?No current  facility-administered medications for this visit.  ? ? ?Patient confirms/reports the following allergies:  ?Allergies  ?Allergen Reactions  ? Latex Swelling  ?  Pt unsure of reaction pt states something happened while in the hospital after surgery.  ? ? ?No orders of the defined types were placed in this encounter. ? ? ?AUTHORIZATION INFORMATION ?Primary Insurance: ?1D#: ?Group #: ? ?Secondary Insurance: ?1D#: ?Group #: ? ?SCHEDULE INFORMATION: ?Date: 12/18/2021 ?Time: ?Location:[facility] ? ?

## 2021-12-04 NOTE — Telephone Encounter (Signed)
Copied from [place] [phone]. Topic: General - Inquiry ?>> Dec 04, 2021  3:45 PM Royal Hawthorn, CMA wrote: ?Reason for CRM: Please relay TSH ;abs/advice per Dr. Ancil Boozer if patient returns call. Thanks in advance! ?

## 2021-12-04 NOTE — Telephone Encounter (Signed)
CALLED PATIENT NO ANSWER LEFT VOICEMAIL FOR A CALL BACK ? ?

## 2021-12-05 NOTE — Telephone Encounter (Signed)
Patient called, left VM on both numbers to return the call to the office.  ? ? ?Steele Sizer, MD  ?12/04/2021  1:17 PM EDT   ?  ?Please make sure patient is notified by phone:  ?TSH is too low and I will adjust thyroid medication dose to 112 mcg and recheck level in 6 weeks   ? ?

## 2021-12-06 NOTE — Telephone Encounter (Signed)
Patient called, left VM on both numbers to return the call to the office for lab results. ?

## 2021-12-07 NOTE — Telephone Encounter (Signed)
Pt. Given results and instructions, verbalizes understanding. ?

## 2021-12-11 ENCOUNTER — Other Ambulatory Visit: Payer: Self-pay | Admitting: Psychiatry

## 2021-12-11 DIAGNOSIS — G479 Sleep disorder, unspecified: Secondary | ICD-10-CM

## 2021-12-11 DIAGNOSIS — F411 Generalized anxiety disorder: Secondary | ICD-10-CM

## 2021-12-11 DIAGNOSIS — F330 Major depressive disorder, recurrent, mild: Secondary | ICD-10-CM

## 2021-12-11 LAB — HM DIABETES EYE EXAM

## 2021-12-13 ENCOUNTER — Ambulatory Visit
Admission: RE | Admit: 2021-12-13 | Discharge: 2021-12-13 | Disposition: A | Discharge status: Home / Self Care | Payer: Commercial Managed Care - HMO | Source: Ambulatory Visit | Attending: Family Medicine | Admitting: Family Medicine

## 2021-12-13 DIAGNOSIS — Z1231 Encounter for screening mammogram for malignant neoplasm of breast: Secondary | ICD-10-CM | POA: Diagnosis present

## 2021-12-18 ENCOUNTER — Ambulatory Visit: Payer: Commercial Managed Care - HMO | Admitting: Registered Nurse

## 2021-12-18 ENCOUNTER — Encounter
Admission: RE | Disposition: A | Discharge status: Home / Self Care | Payer: Self-pay | Source: Home / Self Care | Attending: Gastroenterology

## 2021-12-18 ENCOUNTER — Other Ambulatory Visit: Payer: Self-pay

## 2021-12-18 ENCOUNTER — Encounter: Payer: Self-pay | Admitting: Gastroenterology

## 2021-12-18 ENCOUNTER — Ambulatory Visit
Admission: RE | Admit: 2021-12-18 | Discharge: 2021-12-18 | Disposition: A | Discharge status: Home / Self Care | Payer: Commercial Managed Care - HMO | Attending: Gastroenterology | Admitting: Gastroenterology

## 2021-12-18 DIAGNOSIS — E119 Type 2 diabetes mellitus without complications: Secondary | ICD-10-CM | POA: Diagnosis not present

## 2021-12-18 DIAGNOSIS — F32A Depression, unspecified: Secondary | ICD-10-CM | POA: Insufficient documentation

## 2021-12-18 DIAGNOSIS — I10 Essential (primary) hypertension: Secondary | ICD-10-CM | POA: Insufficient documentation

## 2021-12-18 DIAGNOSIS — K644 Residual hemorrhoidal skin tags: Secondary | ICD-10-CM | POA: Insufficient documentation

## 2021-12-18 DIAGNOSIS — E039 Hypothyroidism, unspecified: Secondary | ICD-10-CM | POA: Diagnosis not present

## 2021-12-18 DIAGNOSIS — Z6841 Body Mass Index (BMI) 40.0 and over, adult: Secondary | ICD-10-CM | POA: Diagnosis not present

## 2021-12-18 DIAGNOSIS — J45909 Unspecified asthma, uncomplicated: Secondary | ICD-10-CM | POA: Insufficient documentation

## 2021-12-18 DIAGNOSIS — G43909 Migraine, unspecified, not intractable, without status migrainosus: Secondary | ICD-10-CM | POA: Diagnosis not present

## 2021-12-18 DIAGNOSIS — Z7984 Long term (current) use of oral hypoglycemic drugs: Secondary | ICD-10-CM | POA: Insufficient documentation

## 2021-12-18 DIAGNOSIS — D124 Benign neoplasm of descending colon: Secondary | ICD-10-CM | POA: Diagnosis not present

## 2021-12-18 DIAGNOSIS — D123 Benign neoplasm of transverse colon: Secondary | ICD-10-CM | POA: Diagnosis not present

## 2021-12-18 DIAGNOSIS — G4730 Sleep apnea, unspecified: Secondary | ICD-10-CM | POA: Diagnosis not present

## 2021-12-18 DIAGNOSIS — F419 Anxiety disorder, unspecified: Secondary | ICD-10-CM | POA: Insufficient documentation

## 2021-12-18 DIAGNOSIS — K635 Polyp of colon: Secondary | ICD-10-CM | POA: Diagnosis not present

## 2021-12-18 DIAGNOSIS — D122 Benign neoplasm of ascending colon: Secondary | ICD-10-CM | POA: Diagnosis not present

## 2021-12-18 DIAGNOSIS — K219 Gastro-esophageal reflux disease without esophagitis: Secondary | ICD-10-CM | POA: Insufficient documentation

## 2021-12-18 DIAGNOSIS — K5730 Diverticulosis of large intestine without perforation or abscess without bleeding: Secondary | ICD-10-CM | POA: Insufficient documentation

## 2021-12-18 DIAGNOSIS — Z86010 Personal history of colonic polyps: Secondary | ICD-10-CM

## 2021-12-18 DIAGNOSIS — Z1211 Encounter for screening for malignant neoplasm of colon: Secondary | ICD-10-CM | POA: Insufficient documentation

## 2021-12-18 HISTORY — PX: COLONOSCOPY WITH PROPOFOL: SHX5780

## 2021-12-18 LAB — GLUCOSE, CAPILLARY: Glucose-Capillary: 140 mg/dL — ABNORMAL HIGH (ref 70–99)

## 2021-12-18 SURGERY — COLONOSCOPY WITH PROPOFOL
Anesthesia: General

## 2021-12-18 MED ORDER — PHENYLEPHRINE HCL (PRESSORS) 10 MG/ML IV SOLN
INTRAVENOUS | Status: DC | PRN
  Administered 2021-12-18: 80 ug via INTRAVENOUS

## 2021-12-18 MED ORDER — EPHEDRINE 5 MG/ML INJ
INTRAVENOUS | Status: AC
  Filled 2021-12-18: qty 5

## 2021-12-18 MED ORDER — PROPOFOL 10 MG/ML IV BOLUS
INTRAVENOUS | Status: DC | PRN
Start: 2021-12-18 — End: 2021-12-18
  Administered 2021-12-18: 70 mg via INTRAVENOUS

## 2021-12-18 MED ORDER — PHENYLEPHRINE HCL (PRESSORS) 10 MG/ML IV SOLN
INTRAVENOUS | Status: AC
  Filled 2021-12-18: qty 1

## 2021-12-18 MED ORDER — GLYCOPYRROLATE 0.2 MG/ML IJ SOLN
INTRAMUSCULAR | Status: AC
  Filled 2021-12-18: qty 1

## 2021-12-18 MED ORDER — SODIUM CHLORIDE 0.9 % IV SOLN: INTRAVENOUS | Status: DC

## 2021-12-18 MED ORDER — PROPOFOL 500 MG/50ML IV EMUL
INTRAVENOUS | Status: DC | PRN
  Administered 2021-12-18: 140 ug/kg/min via INTRAVENOUS

## 2021-12-18 MED ORDER — PROPOFOL 500 MG/50ML IV EMUL
INTRAVENOUS | Status: AC
  Filled 2021-12-18: qty 100

## 2021-12-18 MED ORDER — EPHEDRINE 5 MG/ML INJ
INTRAVENOUS | Status: AC
  Filled 2021-12-18: qty 10

## 2021-12-18 MED ORDER — LIDOCAINE HCL (PF) 2 % IJ SOLN
INTRAMUSCULAR | Status: AC
  Filled 2021-12-18: qty 5

## 2021-12-18 NOTE — Transfer of Care (Signed)
Immediate Anesthesia Transfer of Care Note  Patient: Beverly Barrett  Procedure(s) Performed: COLONOSCOPY WITH PROPOFOL  Patient Location: PACU  Anesthesia Type:General  Level of Consciousness: awake, alert  and oriented  Airway & Oxygen Therapy: Patient Spontanous Breathing  Post-op Assessment: Report given to RN and Post -op Vital signs reviewed and stable  Post vital signs: Reviewed and stable  Last Vitals:  Vitals Value Taken Time  BP 99/58 12/18/21 0935  Temp    Pulse 63 12/18/21 0937  Resp 23 12/18/21 0937  SpO2 99 % 12/18/21 0937  Vitals shown include unvalidated device data.  Last Pain:  Vitals:   12/18/21 0830  TempSrc: Temporal  PainSc: 0-No pain         Complications: No notable events documented.

## 2021-12-18 NOTE — H&P (Signed)
Beverly Darby, MD [address]  [place]  [place], [place] [address]  Main: [phone]  Fax: [phone] Pager: [phone]  Primary Care Physician:  Steele Sizer, MD Primary Gastroenterologist:  Dr. Cephas Barrett  Pre-Procedure History & Physical: HPI:  Beverly Barrett is a 61 y.o. female is here for an colonoscopy.   Past Medical History:  Diagnosis Date   Anginal pain ([place])    none in approx 2 yrs   Anxiety    Asthma    Chronic lower back pain    Common migraine with intractable migraine 09/20/2016   Depression    suicidal ideation   Diabetes mellitus without complication (H[facility])    GERD (gastroesophageal reflux disease)    Headache    migraines -3x/wk   Hyperlipidemia    Hypertension    Hypothyroidism    Motion sickness    cars   Multilevel degenerative disc disease    Shortness of breath dyspnea    Sleep apnea    CPAP   Vertigo    Wears dentures    partial upper    Past Surgical History:  Procedure Laterality Date   ABDOMINAL HYSTERECTOMY     bladder tach  1995   CESAREAN SECTION     COLONOSCOPY WITH PROPOFOL N/A 05/29/2015   Procedure: COLONOSCOPY WITH PROPOFOL;  Surgeon: Lucilla Lame, MD;  Location: [place];  Service: Endoscopy;  Laterality: N/A;  Latex allergy sleep apnea - no CPAP machine (yet)   ESOPHAGOGASTRODUODENOSCOPY N/A 04/23/2017   Procedure: ESOPHAGOGASTRODUODENOSCOPY (EGD);  Surgeon: Lin Landsman, MD;  Location: [place];  Service: Gastroenterology;  Laterality: N/A;  Latex allergy sleep apnea   POLYPECTOMY  05/29/2015   Procedure: POLYPECTOMY;  Surgeon: Lucilla Lame, MD;  Location: [place];  Service: Endoscopy;;    Prior to Admission medications   Medication Sig Start Date End Date Taking? Authorizing Provider  acetaminophen (TYLENOL) 500 MG tablet Take 2 tablets (1,000 mg total) by mouth every 6 (six) hours as needed. 08/30/19   Meuth, Brooke A, PA-C  albuterol (VENTOLIN HFA) 108 (90  Base) MCG/ACT inhaler INHALE 2 PUFFS BY MOUTH EVERY 6 HOURS AS NEEDED FOR WHEEZING OR SHORTNESS OF BREATH 10/03/21   Ancil Boozer, Drue Stager, MD  amLODipine (NORVASC) 2.5 MG tablet Take 1 tablet (2.5 mg total) by mouth daily. 10/03/21   Steele Sizer, MD  aspirin 81 MG EC tablet Take 81 mg by mouth daily.    [provider]  cloNIDine (CATAPRES) 0.1 MG tablet Take 1 tablet (0.1 mg total) by mouth 2 (two) times daily. 10/03/21   Steele Sizer, MD  estradiol (ESTRACE VAGINAL) 0.1 MG/GM vaginal cream Apply 0.'5mg'$  (pea-sized amount)  just inside the vaginal introitus with a finger-tip on Monday, Wednesday and Friday nights. 06/12/20   Zara Council A, PA-C  fluticasone (FLONASE) 50 MCG/ACT nasal spray Place 2 sprays into both nostrils daily as needed for allergies. 10/03/21   Steele Sizer, MD  fluticasone (FLOVENT HFA) 44 MCG/ACT inhaler Inhale 2 puffs into the lungs 2 (two) times daily. 10/03/21   Steele Sizer, MD  gabapentin (NEURONTIN) 400 MG capsule 1 capsule in the morning and at midday, 2 in the evening 07/04/21   Suzzanne Cloud, NP  glucose blood test strip Use as instructed 03/28/21   Steele Sizer, MD  ketoconazole (NIZORAL) 2 % cream Apply 1 application. topically daily. 10/03/21   Steele Sizer, MD  Lancet Devices (ONE TOUCH DELICA LANCING DEV) MISC 1 each by Does not apply  route daily. 09/20/19   Steele Sizer, MD  levothyroxine (SYNTHROID) 112 MCG tablet Take 1 tablet (112 mcg total) by mouth daily. Recheck level in 6 weeks 12/04/21   Steele Sizer, MD  LINZESS 145 MCG CAPS capsule Take 1 capsule (145 mcg total) by mouth daily. 10/22/21   Lin Landsman, MD  metFORMIN (GLUCOPHAGE-XR) 750 MG 24 hr tablet TAKE 2 TABLETS BY MOUTH ONCE A DAY WITH BREAKFAST 10/03/21   Ancil Boozer, Drue Stager, MD  metoprolol succinate (TOPROL-XL) 50 MG 24 hr tablet TAKE 1 TABLET BY MOUTH ONCE A DAY WITH OR IMMEDIATELY FOLLOWING A MEAL. 10/03/21   Sowles, Drue Stager, MD  mirtazapine (REMERON) 7.5 MG tablet TAKE 1 TABLET BY  MOUTH AT BEDTIME 12/12/21   Eappen, Ria Clock, MD  montelukast (SINGULAIR) 10 MG tablet Take 1 tablet (10 mg total) by mouth at bedtime. 10/03/21   Steele Sizer, MD  nitroGLYCERIN (NITROSTAT) 0.4 MG SL tablet Place 1 tablet (0.4 mg total) under the tongue every 5 (five) minutes as needed. 02/20/17   Minna Merritts, MD  olmesartan-hydrochlorothiazide (BENICAR HCT) 40-25 MG tablet Take 1 tablet by mouth daily. 10/03/21   Steele Sizer, MD  omeprazole (PRILOSEC) 40 MG capsule Take 1 capsule (40 mg total) by mouth daily. 10/22/21   Lin Landsman, MD  OneTouch Delica Lancets 18H MISC 1 each by Does not apply route daily. 03/28/21   Steele Sizer, MD  oxybutynin (DITROPAN XL) 15 MG 24 hr tablet Take 1 tablet (15 mg total) by mouth daily. 10/16/20   Zara Council A, PA-C  pioglitazone (ACTOS) 15 MG tablet Take 1 tablet (15 mg total) by mouth daily. 10/03/21   Steele Sizer, MD  polyethylene glycol (MIRALAX) 17 g packet Take 17 g by mouth daily as needed for mild constipation. 08/30/19   Meuth, Blaine Hamper, PA-C  rizatriptan (MAXALT) 10 MG tablet May repeat in 2 hours if needed 07/04/21   Suzzanne Cloud, NP  rosuvastatin (CRESTOR) 40 MG tablet Take 1 tablet (40 mg total) by mouth daily. In place of Atorvastatin 10/03/21   Steele Sizer, MD  senna (SENOKOT) 8.6 MG TABS tablet Take 1 tablet by mouth daily.    [provider]    Allergies as of 12/04/2021 - Review Complete 12/04/2021  Allergen Reaction Noted   Latex Swelling 04/24/2015    Family History  Problem Relation Age of Onset   Diabetes Sister    Anxiety disorder Sister    Depression Sister    Hypertension Sister    Cirrhosis Mother    Diabetes Mother    Cirrhosis Father    Breast cancer Sister 1   Heart attack Brother    Alcohol abuse Brother    Drug abuse Brother    Prostate cancer Neg Hx    Kidney cancer Neg Hx    Bladder Cancer Neg Hx     Social History   Socioeconomic History   Marital status: Married    Spouse  name: IT sales professional   Number of children: 3   Years of education: Not on file   Highest education level: GED or equivalent  Occupational History   Not on file  Tobacco Use   Smoking status: Never   Smokeless tobacco: Never  Vaping Use   Vaping Use: Never used  Substance and Sexual Activity   Alcohol use: No    Alcohol/week: 0.0 standard drinks   Drug use: No   Sexual activity: Not Currently    Partners: Male  Other Topics Concern  Not on file  Social History Narrative   Lives with husband and one daughter other kids are out of the house   She does not work, husband on disability    Social Determinants of Radio broadcast assistant Strain: Not on file  Food Insecurity: Not on file  Transportation Needs: Not on file  Physical Activity: Not on file  Stress: Not on file  Social Connections: Not on file  Intimate Partner Violence: Not on file    Review of Systems: See HPI, otherwise negative ROS  Physical Exam: BP (!) 158/85   Temp (!) 96.9 F (36.1 C) (Temporal)   Resp 20   Ht '5\' 1"'$  (1.549 m)   Wt 98.9 kg   SpO2 98%   BMI 41.19 kg/m  General:   Alert,  pleasant and cooperative in NAD Head:  Normocephalic and atraumatic. Neck:  Supple; no masses or thyromegaly. Lungs:  Clear throughout to auscultation.    Heart:  Regular rate and rhythm. Abdomen:  Soft, nontender and nondistended. Normal bowel sounds, without guarding, and without rebound.   Neurologic:  Alert and  oriented x4;  grossly normal neurologically.  Impression/Plan: Beverly Barrett is here for an colonoscopy to be performed for colon cancer screening  Risks, benefits, limitations, and alternatives regarding  colonoscopy have been reviewed with the patient.  Questions have been answered.  All parties agreeable.   Sherri Sear, MD  12/18/2021, 8:44 AM

## 2021-12-18 NOTE — Op Note (Signed)
[facility] Gastroenterology Patient Name: Beverly Barrett Procedure Date: 12/18/2021 8:57 AM MRN: [id number] Account #: [id number] Date of Birth: Feb 07, 1961 Admit Type: Outpatient Age: 61 Room: [facility] ENDO ROOM 3 Gender: Female Note Status: Finalized Instrument Name: Jasper Riling [id number] Procedure:             Colonoscopy Indications:           Screening for colorectal malignant neoplasm, Last                         colonoscopy: October 2016 Providers:             Lin Landsman MD, MD Referring MD:          Bethena Roys. Sowles, MD (Referring MD) Medicines:             General Anesthesia Complications:         No immediate complications. Estimated blood loss: None. Procedure:             Pre-Anesthesia Assessment:                        - Prior to the procedure, a History and Physical was                         performed, and patient medications and allergies were                         reviewed. The patient is competent. The risks and                         benefits of the procedure and the sedation options and                         risks were discussed with the patient. All questions                         were answered and informed consent was obtained.                         Patient identification and proposed procedure were                         verified by the physician, the nurse, the                         anesthesiologist, the anesthetist and the technician                         in the pre-procedure area in the procedure room in the                         endoscopy suite. Mental Status Examination: alert and                         oriented. Airway Examination: normal oropharyngeal                         airway and neck mobility. Respiratory Examination:  clear to auscultation. CV Examination: normal.                         Prophylactic Antibiotics: The patient does not require                         prophylactic  antibiotics. Prior Anticoagulants: The                         patient has taken no previous anticoagulant or                         antiplatelet agents. ASA Grade Assessment: III - A                         patient with severe systemic disease. After reviewing                         the risks and benefits, the patient was deemed in                         satisfactory condition to undergo the procedure. The                         anesthesia plan was to use general anesthesia.                         Immediately prior to administration of medications,                         the patient was re-assessed for adequacy to receive                         sedatives. The heart rate, respiratory rate, oxygen                         saturations, blood pressure, adequacy of pulmonary                         ventilation, and response to care were monitored                         throughout the procedure. The physical status of the                         patient was re-assessed after the procedure.                        After obtaining informed consent, the colonoscope was                         passed under direct vision. Throughout the procedure,                         the patient's blood pressure, pulse, and oxygen                         saturations were monitored continuously. The  Colonoscope was introduced through the anus and                         advanced to the the cecum, identified by appendiceal                         orifice and ileocecal valve. The colonoscopy was                         unusually difficult due to significant looping.                         Successful completion of the procedure was aided by                         applying abdominal pressure. The patient tolerated the                         procedure well. The quality of the bowel preparation                         was evaluated using the BBPS [facility] Bowel Preparation                          Scale) with scores of: Right Colon = 3, Transverse                         Colon = 3 and Left Colon = 3 (entire mucosa seen well                         with no residual staining, small fragments of stool or                         opaque liquid). The total BBPS score equals 9. Findings:      The perianal and digital rectal examinations were normal. Pertinent       negatives include normal sphincter tone.      Skin tags were found on perianal exam.      Two sessile, non-bleeding polyps were found in the ascending colon and       cecum. The polyps were 3 to 4 mm in size. These polyps were removed with       a cold snare. Resection and retrieval were complete.      A diminutive polyp was found in the transverse colon. The polyp was       sessile. The polyp was removed with a jumbo cold forceps. Resection and       retrieval were complete.      Two sessile polyps were found in the descending colon. The polyps were 4       to 5 mm in size. These polyps were removed with a cold snare. Resection       and retrieval were complete.      Multiple diverticula were found in the sigmoid colon, ascending colon       and cecum. There was no evidence of diverticular bleeding.      Non-bleeding external hemorrhoids were found during retroflexion. The       hemorrhoids were medium-sized. Impression:            -  Perianal skin tags found on perianal exam.                        - Two 3 to 4 mm, non-bleeding polyps in the ascending                         colon and in the cecum, removed with a cold snare.                         Resected and retrieved.                        - One diminutive polyp in the transverse colon,                         removed with a jumbo cold forceps. Resected and                         retrieved.                        - Two 4 to 5 mm polyps in the descending colon,                         removed with a cold snare. Resected and retrieved.                        -  Severe diverticulosis in the sigmoid colon, in the                         ascending colon and in the cecum. There was no                         evidence of diverticular bleeding.                        - Non-bleeding external hemorrhoids. Recommendation:        - Repeat colonoscopy in 3 - 5 years for surveillance                         of multiple polyps.                        - Discharge patient to home (with escort).                        - Resume previous diet today.                        - Await pathology results. Procedure Code(s):     --- Professional ---                        [phone], Colonoscopy, flexible; with removal of                         tumor(s), polyp(s), or other lesion(s) by snare  technique                        45380, 59, Colonoscopy, flexible; with biopsy, single                         or multiple Diagnosis Code(s):     --- Professional ---                        K63.5, Polyp of colon                        K64.4, Residual hemorrhoidal skin tags                        Z12.11, Encounter for screening for malignant neoplasm                         of colon                        K57.30, Diverticulosis of large intestine without                         perforation or abscess without bleeding CPT copyright 2019 American Medical Association. All rights reserved. The codes documented in this report are preliminary and upon coder review may  be revised to meet current compliance requirements. Dr. Ulyess Mort Lin Landsman MD, MD 12/18/2021 9:35:51 AM This report has been signed electronically. Number of Addenda: 0 Note Initiated On: 12/18/2021 8:57 AM Scope Withdrawal Time: 0 hours 23 minutes 11 seconds  Total Procedure Duration: 0 hours 28 minutes 28 seconds  Estimated Blood Loss:  Estimated blood loss: none.      [facility]

## 2021-12-18 NOTE — Anesthesia Postprocedure Evaluation (Signed)
Anesthesia Post Note  Patient: Beverly Barrett  Procedure(s) Performed: COLONOSCOPY WITH PROPOFOL  Patient location during evaluation: Endoscopy Anesthesia Type: General Level of consciousness: awake and alert Pain management: pain level controlled Vital Signs Assessment: post-procedure vital signs reviewed and stable Respiratory status: spontaneous breathing, nonlabored ventilation, respiratory function stable and patient connected to nasal cannula oxygen Cardiovascular status: blood pressure returned to baseline and stable Postop Assessment: no apparent nausea or vomiting Anesthetic complications: no   No notable events documented.   Last Vitals:  Vitals:   12/18/21 0830 12/18/21 0945  BP: (!) 158/85 128/76  Resp: 20   Temp: (!) 36.1 C   SpO2: 98%     Last Pain:  Vitals:   12/18/21 0955  TempSrc:   PainSc: 0-No pain                 Precious Haws Samarie Pinder

## 2021-12-18 NOTE — Anesthesia Preprocedure Evaluation (Signed)
Anesthesia Evaluation  Patient identified by MRN, date of birth, ID band Patient awake    Reviewed: Allergy & Precautions, NPO status , Patient's Chart, lab work & pertinent test results  History of Anesthesia Complications Negative for: history of anesthetic complications  Airway Mallampati: III  TM Distance: >3 FB Neck ROM: full    Dental  (+) Chipped, Poor Dentition, Missing   Pulmonary neg shortness of breath, asthma , sleep apnea and Continuous Positive Airway Pressure Ventilation ,    Pulmonary exam normal        Cardiovascular Exercise Tolerance: Good hypertension, + angina Normal cardiovascular exam     Neuro/Psych  Headaches, negative psych ROS   GI/Hepatic Neg liver ROS, GERD  Controlled,  Endo/Other  diabetes, Type 2Hypothyroidism Morbid obesity  Renal/GU negative Renal ROS  negative genitourinary   Musculoskeletal   Abdominal   Peds  Hematology negative hematology ROS (+)   Anesthesia Other Findings Past Medical History: No date: Anginal pain ([place])     Comment:  none in approx 2 yrs No date: Anxiety No date: Asthma No date: Chronic lower back pain 09/20/2016: Common migraine with intractable migraine No date: Depression     Comment:  suicidal ideation No date: Diabetes mellitus without complication (HCC) No date: GERD (gastroesophageal reflux disease) No date: Headache     Comment:  migraines -3x/wk No date: Hyperlipidemia No date: Hypertension No date: Hypothyroidism No date: Motion sickness     Comment:  cars No date: Multilevel degenerative disc disease No date: Shortness of breath dyspnea No date: Sleep apnea     Comment:  CPAP No date: Vertigo No date: Wears dentures     Comment:  partial upper  Past Surgical History: No date: ABDOMINAL HYSTERECTOMY 1995: bladder tach No date: CESAREAN SECTION 05/29/2015: COLONOSCOPY WITH PROPOFOL; N/A     Comment:  Procedure: COLONOSCOPY WITH  PROPOFOL;  Surgeon: Lucilla Lame, MD;  Location: [place];  Service:               Endoscopy;  Laterality: N/A;  Latex allergy sleep apnea               - no CPAP machine (yet) 04/23/2017: ESOPHAGOGASTRODUODENOSCOPY; N/A     Comment:  Procedure: ESOPHAGOGASTRODUODENOSCOPY (EGD);  Surgeon:               Lin Landsman, MD;  Location: [place];               Service: Gastroenterology;  Laterality: N/A;  Latex               allergy sleep apnea 05/29/2015: POLYPECTOMY     Comment:  Procedure: POLYPECTOMY;  Surgeon: Lucilla Lame, MD;                Location: [place];  Service: Endoscopy;;  BMI    Body Mass Index: 41.19 kg/m      Reproductive/Obstetrics negative OB ROS                             Anesthesia Physical Anesthesia Plan  ASA: 3  Anesthesia Plan: General   Post-op Pain Management:    Induction: Intravenous  PONV Risk Score and Plan: Propofol infusion and TIVA  Airway Management Planned: Natural Airway and Nasal Cannula  Additional Equipment:   Intra-op Plan:   Post-operative Plan:  Informed Consent: I have reviewed the patients History and Physical, chart, labs and discussed the procedure including the risks, benefits and alternatives for the proposed anesthesia with the patient or authorized representative who has indicated his/her understanding and acceptance.     Dental Advisory Given  Plan Discussed with: Anesthesiologist, CRNA and Surgeon  Anesthesia Plan Comments: (Patient consented for risks of anesthesia including but not limited to:  - adverse reactions to medications - risk of airway placement if required - damage to eyes, teeth, lips or other oral mucosa - nerve damage due to positioning  - sore throat or hoarseness - Damage to heart, brain, nerves, lungs, other parts of body or loss of life  Patient voiced understanding.)        Anesthesia Quick Evaluation

## 2021-12-19 ENCOUNTER — Encounter: Payer: Self-pay | Admitting: Gastroenterology

## 2021-12-19 LAB — SURGICAL PATHOLOGY

## 2021-12-25 ENCOUNTER — Ambulatory Visit: Payer: 59 | Admitting: Psychiatry

## 2021-12-26 ENCOUNTER — Encounter: Payer: Self-pay | Admitting: Psychiatry

## 2021-12-26 ENCOUNTER — Ambulatory Visit (INDEPENDENT_AMBULATORY_CARE_PROVIDER_SITE_OTHER): Payer: No Typology Code available for payment source | Admitting: Psychiatry

## 2021-12-26 VITALS — BP 132/81 | HR 67 | Temp 98.3°F | Wt 219.6 lb

## 2021-12-26 DIAGNOSIS — R413 Other amnesia: Secondary | ICD-10-CM | POA: Diagnosis not present

## 2021-12-26 DIAGNOSIS — F411 Generalized anxiety disorder: Secondary | ICD-10-CM | POA: Diagnosis not present

## 2021-12-26 DIAGNOSIS — F3342 Major depressive disorder, recurrent, in full remission: Secondary | ICD-10-CM | POA: Insufficient documentation

## 2021-12-26 DIAGNOSIS — G479 Sleep disorder, unspecified: Secondary | ICD-10-CM

## 2021-12-26 MED ORDER — MIRTAZAPINE 7.5 MG PO TABS: 7.5000 mg | ORAL_TABLET | Freq: Every day | ORAL | 0 refills | Status: DC

## 2021-12-26 NOTE — Progress Notes (Unsigned)
ERROR

## 2021-12-26 NOTE — Progress Notes (Unsigned)
[place] MD OP Progress Note  12/26/2021 1:23 PM Beverly Barrett  MRN:  [id number]  Chief Complaint:  Chief Complaint  Patient presents with   Follow-up: 61 year old African-American female who has a history of MDD, GAD, OSA on CPAP presented for medication management.   HPI: Beverly Barrett is a 61 year old African-American female, married, lives in [place], has a history of MDD, GAD, chronic pain, arthritis, history of OSA on CPAP, hypothyroidism, migraine headaches was evaluated in office today.  Patient today reports she has been able to control her anxiety symptoms better than before.  Denies any sadness or anhedonia or hopelessness.  Reports sleep is good.  Reports appetite is fair.  Reports memory issues as getting better.  She has been keeping reminders and checklists.  Currently compliant on medications.  Denies side effects.  Denies any suicidality, homicidality or perceptual disturbances.  Patient denies any other concerns today.  Visit Diagnosis:    ICD-10-CM   1. MDD (major depressive disorder), recurrent, in full remission ([place])  F33.42     2. GAD (generalized anxiety disorder)  F41.1 mirtazapine (REMERON) 7.5 MG tablet    3. Sleep disorder  G47.9    OSA on CPAP,mood    4. Memory loss  R41.3       Past Psychiatric History: Reviewed past psychiatric history from progress note on 01/21/2018.  Past Medical History:  Past Medical History:  Diagnosis Date   Anginal pain ([place])    none in approx 2 yrs   Anxiety    Asthma    Chronic lower back pain    Common migraine with intractable migraine 09/20/2016   Depression    suicidal ideation   Diabetes mellitus without complication (HCC)    GERD (gastroesophageal reflux disease)    Headache    migraines -3x/wk   Hyperlipidemia    Hypertension    Hypothyroidism    Motion sickness    cars   Multilevel degenerative disc disease    Shortness of breath dyspnea    Sleep apnea    CPAP   Vertigo    Wears  dentures    partial upper    Past Surgical History:  Procedure Laterality Date   ABDOMINAL HYSTERECTOMY     bladder tach  1995   CESAREAN SECTION     COLONOSCOPY WITH PROPOFOL N/A 05/29/2015   Procedure: COLONOSCOPY WITH PROPOFOL;  Surgeon: Lucilla Lame, MD;  Location: [place];  Service: Endoscopy;  Laterality: N/A;  Latex allergy sleep apnea - no CPAP machine (yet)   COLONOSCOPY WITH PROPOFOL N/A 12/18/2021   Procedure: COLONOSCOPY WITH PROPOFOL;  Surgeon: Lin Landsman, MD;  Location: [facility] ENDOSCOPY;  Service: Gastroenterology;  Laterality: N/A;   ESOPHAGOGASTRODUODENOSCOPY N/A 04/23/2017   Procedure: ESOPHAGOGASTRODUODENOSCOPY (EGD);  Surgeon: Lin Landsman, MD;  Location: [place];  Service: Gastroenterology;  Laterality: N/A;  Latex allergy sleep apnea   POLYPECTOMY  05/29/2015   Procedure: POLYPECTOMY;  Surgeon: Lucilla Lame, MD;  Location: [place];  Service: Endoscopy;;    Family Psychiatric History: Reviewed family psychiatric history from progress note on 01/21/2018.  Family History:  Family History  Problem Relation Age of Onset   Diabetes Sister    Anxiety disorder Sister    Depression Sister    Hypertension Sister    Cirrhosis Mother    Diabetes Mother    Cirrhosis Father    Breast cancer Sister 49   Heart attack Brother    Alcohol abuse Brother    Drug abuse  Brother    Prostate cancer Neg Hx    Kidney cancer Neg Hx    Bladder Cancer Neg Hx     Social History: Reviewed social history from progress note on 01/21/2018. Social History   Socioeconomic History   Marital status: Married    Spouse name: Beverly Barrett   Number of children: 3   Years of education: Not on file   Highest education level: GED or equivalent  Occupational History   Not on file  Tobacco Use   Smoking status: Never   Smokeless tobacco: Never  Vaping Use   Vaping Use: Never used  Substance and Sexual Activity   Alcohol use: No     Alcohol/week: 0.0 standard drinks   Drug use: No   Sexual activity: Not Currently    Partners: Male  Other Topics Concern   Not on file  Social History Narrative   Lives with husband and one daughter other kids are out of the house   She does not work, husband on disability    Social Determinants of Radio broadcast assistant Strain: Not on file  Food Insecurity: Not on file  Transportation Needs: Not on file  Physical Activity: Not on file  Stress: Not on file  Social Connections: Not on file    Allergies:  Allergies  Allergen Reactions   Latex Swelling    Pt unsure of reaction pt states something happened while in the hospital after surgery.    Metabolic Disorder Labs: Lab Results  Component Value Date   HGBA1C 6.3 (A) 12/03/2021   MPG 148 08/06/2021   MPG 162.81 08/28/2019   No results found for: PROLACTIN Lab Results  Component Value Date   CHOL 165 03/12/2021   TRIG 278 (H) 03/12/2021   HDL 40 (L) 03/12/2021   CHOLHDL 4.1 03/12/2021   VLDL 44 (H) 02/12/2017   LDLCALC 88 03/12/2021   LDLCALC 127 (H) 08/10/2020   Lab Results  Component Value Date   TSH 0.01 (L) 12/03/2021   TSH 0.03 (L) 10/03/2021    Therapeutic Level Labs: No results found for: LITHIUM No results found for: VALPROATE No components found for:  CBMZ  Current Medications: Current Outpatient Medications  Medication Sig Dispense Refill   acetaminophen (TYLENOL) 500 MG tablet Take 2 tablets (1,000 mg total) by mouth every 6 (six) hours as needed. 30 tablet 0   albuterol (VENTOLIN HFA) 108 (90 Base) MCG/ACT inhaler INHALE 2 PUFFS BY MOUTH EVERY 6 HOURS AS NEEDED FOR WHEEZING OR SHORTNESS OF BREATH 2 each 0   amLODipine (NORVASC) 2.5 MG tablet Take 1 tablet (2.5 mg total) by mouth daily. 90 tablet 1   aspirin 81 MG EC tablet Take 81 mg by mouth daily.     cloNIDine (CATAPRES) 0.1 MG tablet Take 1 tablet (0.1 mg total) by mouth 2 (two) times daily. 180 tablet 1   estradiol (ESTRACE VAGINAL)  0.1 MG/GM vaginal cream Apply 0.'5mg'$  (pea-sized amount)  just inside the vaginal introitus with a finger-tip on Monday, Wednesday and Friday nights. 30 g 12   fluticasone (FLONASE) 50 MCG/ACT nasal spray Place 2 sprays into both nostrils daily as needed for allergies. 48 mL 0   fluticasone (FLOVENT HFA) 44 MCG/ACT inhaler Inhale 2 puffs into the lungs 2 (two) times daily. 3 each 1   gabapentin (NEURONTIN) 400 MG capsule 1 capsule in the morning and at midday, 2 in the evening 360 capsule 1   glucose blood test strip Use as instructed 100  each 12   ketoconazole (NIZORAL) 2 % cream Apply 1 application. topically daily. 60 g 0   Lancet Devices (ONE TOUCH DELICA LANCING DEV) MISC 1 each by Does not apply route daily. 1 each 0   levothyroxine (SYNTHROID) 112 MCG tablet Take 1 tablet (112 mcg total) by mouth daily. Recheck level in 6 weeks 30 tablet 3   LINZESS 145 MCG CAPS capsule Take 1 capsule (145 mcg total) by mouth daily. 90 capsule 0   metFORMIN (GLUCOPHAGE-XR) 750 MG 24 hr tablet TAKE 2 TABLETS BY MOUTH ONCE A DAY WITH BREAKFAST 180 tablet 1   metoprolol succinate (TOPROL-XL) 50 MG 24 hr tablet TAKE 1 TABLET BY MOUTH ONCE A DAY WITH OR IMMEDIATELY FOLLOWING A MEAL. 90 tablet 1   montelukast (SINGULAIR) 10 MG tablet Take 1 tablet (10 mg total) by mouth at bedtime. 90 tablet 1   nitroGLYCERIN (NITROSTAT) 0.4 MG SL tablet Place 1 tablet (0.4 mg total) under the tongue every 5 (five) minutes as needed. 25 tablet 0   olmesartan-hydrochlorothiazide (BENICAR HCT) 40-25 MG tablet Take 1 tablet by mouth daily. 90 tablet 1   omeprazole (PRILOSEC) 40 MG capsule Take 1 capsule (40 mg total) by mouth daily. 90 capsule 0   OneTouch Delica Lancets 16X MISC 1 each by Does not apply route daily. 100 each 2   oxybutynin (DITROPAN XL) 15 MG 24 hr tablet Take 1 tablet (15 mg total) by mouth daily. 90 tablet 3   pioglitazone (ACTOS) 15 MG tablet Take 1 tablet (15 mg total) by mouth daily. 90 tablet 1   rizatriptan  (MAXALT) 10 MG tablet May repeat in 2 hours if needed 24 tablet 0   rosuvastatin (CRESTOR) 40 MG tablet Take 1 tablet (40 mg total) by mouth daily. In place of Atorvastatin 90 tablet 3   senna (SENOKOT) 8.6 MG TABS tablet Take 1 tablet by mouth daily.     mirtazapine (REMERON) 7.5 MG tablet Take 1 tablet (7.5 mg total) by mouth at bedtime. 90 tablet 0   No current facility-administered medications for this visit.     Musculoskeletal: Strength & Muscle Tone: within normal limits Gait & Station: normal Patient leans: N/A  Psychiatric Specialty Exam: Review of Systems  Psychiatric/Behavioral:  The patient is nervous/anxious.   All other systems reviewed and are negative.  Blood pressure 132/81, pulse 67, temperature 98.3 F (36.8 C), temperature source Temporal, weight 219 lb 9.6 oz (99.6 kg).Body mass index is 41.49 kg/m.  General Appearance: Casual  Eye Contact:  Fair  Speech:  Clear and Coherent  Volume:  Normal  Mood:  Anxious  Affect:  Congruent  Thought Process:  Goal Directed and Descriptions of Associations: Intact  Orientation:  Full (Time, Place, and Person)  Thought Content: Logical   Suicidal Thoughts:  No  Homicidal Thoughts:  No  Memory:  Immediate;   Fair Recent;   Fair Remote;   Fair  Judgement:  Fair  Insight:  Fair  Psychomotor Activity:  Normal  Concentration:  Concentration: Fair and Attention Span: Fair  Recall:  [employer] of Knowledge: Fair  Language: Fair  Akathisia:  No  Handed:  Right  AIMS (if indicated): done  Assets:  Communication Skills Desire for Improvement Housing Social Support  ADL's:  Intact  Cognition: WNL  Sleep:  Fair   Screenings: [place] Office Visit from 12/26/2021 in [place] Office Visit from 10/24/2021 in [place] Total  Score 0 0      GAD-7    Flowsheet Row Office Visit from 12/26/2021 in [place]  Office Visit from 12/03/2021 in [facility] Office Visit from 03/19/2018 in [facility] Office Visit from 03/04/2016 in [facility] Office Visit from 12/20/2015 in [facility]  Total GAD-7 Score '3 12 14 18 18      '$ PHQ2-9    [place] Visit from 12/26/2021 in [place] Office Visit from 12/03/2021 in [facility] Office Visit from 10/24/2021 in [place] Office Visit from 10/03/2021 in [facility] [address] Office Visit from 08/27/2021 in [place]  PHQ-2 Total Score '2 4 2 3 4  '$ PHQ-9 Total Score '5 11 5 5 10      '$ [place] Office Visit from 12/26/2021 in [place] Admission (Discharged) from 12/18/2021 in [place] Office Visit from 10/24/2021 in [place]  C-SSRS RISK CATEGORY No Risk No Risk No Risk        Assessment and Plan: Beverly Barrett is a 61 year old African-American female, married, lives in [place], has a history of MDD, GAD, insomnia, OSA on CPAP, migraine headaches, hypothyroidism, arthritis was evaluated in office today.  Patient is currently stable.  Plan MDD in remission Mirtazapine 7.5 mg p.o. nightly  GAD-improving Mirtazapine 7.5 mg p.o. nightly  Sleep disorder-stable Continue CPAP for OSA. Mirtazapine also helps with sleep.  Memory problems-improving MMSE-08/27/2021-27 out of 30. Will monitor closely  Follow-up in clinic in 3 to 4 months or sooner if needed.  This note was generated in part or whole with voice recognition software. Voice recognition is usually quite accurate but there are transcription errors that can and very often do occur. I apologize for any typographical errors that were not detected and corrected.     Ursula Alert, MD 12/27/2021, 8:27  AM

## 2021-12-28 ENCOUNTER — Encounter: Payer: Self-pay | Admitting: Family Medicine

## 2021-12-29 ENCOUNTER — Other Ambulatory Visit: Payer: Self-pay | Admitting: Urology

## 2022-01-10 ENCOUNTER — Other Ambulatory Visit: Payer: Self-pay | Admitting: Family Medicine

## 2022-01-10 ENCOUNTER — Ambulatory Visit (INDEPENDENT_AMBULATORY_CARE_PROVIDER_SITE_OTHER): Payer: Medicaid Other | Admitting: Neurology

## 2022-01-10 ENCOUNTER — Encounter: Payer: Self-pay | Admitting: Neurology

## 2022-01-10 VITALS — BP 121/84 | HR 64 | Ht 61.0 in | Wt 221.0 lb

## 2022-01-10 DIAGNOSIS — G43019 Migraine without aura, intractable, without status migrainosus: Secondary | ICD-10-CM | POA: Diagnosis not present

## 2022-01-10 DIAGNOSIS — R051 Acute cough: Secondary | ICD-10-CM

## 2022-01-10 MED ORDER — GABAPENTIN 400 MG PO CAPS: ORAL_CAPSULE | ORAL | 1 refills | Status: DC

## 2022-01-10 MED ORDER — RIZATRIPTAN BENZOATE 10 MG PO TABS: ORAL_TABLET | ORAL | 3 refills | Status: DC

## 2022-01-10 MED ORDER — NURTEC 75 MG PO TBDP: 75.0000 mg | ORAL_TABLET | ORAL | 11 refills | Status: DC

## 2022-01-10 NOTE — Telephone Encounter (Signed)
Left vm asking if she is still taking

## 2022-01-10 NOTE — Patient Instructions (Signed)
Let's try Nurtec 75 mg every other day for migraine prevention  Continue the Maxalt as needed for acute headache  I would like to consider Emgality once a month injection for migraine prevention or Botox every 3 months, but this depends on insurance
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
## 2022-01-15 ENCOUNTER — Telehealth: Payer: Self-pay | Admitting: *Deleted

## 2022-01-15 NOTE — Telephone Encounter (Signed)
PA for Nurtec '75mg'$ , as a preventive therapy, started on covermymeds (key [id number]). Her pharmacy coverage is now through [place] [phone]). Decision pending.

## 2022-01-20 ENCOUNTER — Other Ambulatory Visit: Payer: Self-pay | Admitting: Gastroenterology

## 2022-01-20 DIAGNOSIS — K219 Gastro-esophageal reflux disease without esophagitis: Secondary | ICD-10-CM

## 2022-01-21 ENCOUNTER — Encounter: Payer: Self-pay | Admitting: Family Medicine

## 2022-01-21 ENCOUNTER — Encounter: Payer: Self-pay | Admitting: *Deleted

## 2022-01-22 ENCOUNTER — Other Ambulatory Visit: Payer: Self-pay

## 2022-01-22 DIAGNOSIS — G8929 Other chronic pain: Secondary | ICD-10-CM

## 2022-01-31 ENCOUNTER — Other Ambulatory Visit: Payer: Self-pay | Admitting: Gastroenterology

## 2022-01-31 DIAGNOSIS — K219 Gastro-esophageal reflux disease without esophagitis: Secondary | ICD-10-CM

## 2022-02-04 NOTE — Telephone Encounter (Signed)
I called [employer] to check on the appeal status. Per rep, Jeneen Rinks, this process takes up to 14 business days. We will continue to monitor. I also called [facility] and provided an update.

## 2022-02-06 ENCOUNTER — Other Ambulatory Visit: Payer: Self-pay | Admitting: Gastroenterology

## 2022-02-06 DIAGNOSIS — K5904 Chronic idiopathic constipation: Secondary | ICD-10-CM

## 2022-02-25 NOTE — Telephone Encounter (Addendum)
I was on an extended call with [employer] today. Spoke to Engineer, petroleum. She informed me the patient's particular plan actually has 90-days to address an appeal. Stated our office should have completed a peer-to-peer to correct the information. However, the day we called [employer], we were instructed to do the appeal. Also, three other reps have told us it was only a 30-day turn around time. This was started with one question being answered incorrectly on the original PA request. The patient meets criteria. An appeal letter was submitted 01/21/22.   I also spoke to a higher level Freight forwarder, Pierce. She could not help any further. She informed me to send a fax to the appeal department for an update. Case QR#[id number]. Fax: [phone]. I have done this today. She has a medicare plan and is unable to use a co-pay card. The patient is having to wait to start this medication. She has been updated on the situation (left message). The pharmacy has been notified too.

## 2022-03-04 ENCOUNTER — Other Ambulatory Visit: Payer: Self-pay

## 2022-03-04 DIAGNOSIS — E034 Atrophy of thyroid (acquired): Secondary | ICD-10-CM

## 2022-03-05 ENCOUNTER — Other Ambulatory Visit: Payer: Self-pay | Admitting: Family Medicine

## 2022-03-05 LAB — TSH: TSH: 0.48 mIU/L (ref 0.40–4.50)

## 2022-03-13 ENCOUNTER — Encounter: Payer: Self-pay | Admitting: Family Medicine

## 2022-03-13 ENCOUNTER — Other Ambulatory Visit: Payer: Self-pay

## 2022-03-13 DIAGNOSIS — E034 Atrophy of thyroid (acquired): Secondary | ICD-10-CM

## 2022-03-13 MED ORDER — LEVOTHYROXINE SODIUM 112 MCG PO TABS: 112.0000 ug | ORAL_TABLET | Freq: Every day | ORAL | 3 refills | Status: DC

## 2022-04-02 NOTE — Progress Notes (Unsigned)
Name: Beverly Barrett   MRN: [id number]    DOB: [birth year]   Date:04/03/2022       Progress Note  Subjective  Chief Complaint  Follow Up  HPI  Diabetes type 2 with dyslipidemia in obese: she has a long history of metabolic syndrome. A1C has been at goal, last level done 01/23 and it was 6.8 % today it is 6.2 %   Glucose fasting has been from 95-139   She is on Metformin and also Actos and doing well, no side effects   She denies polyphagia but she has chronic polyuria and polydipsia.  She has some feet pain/neuropathy , on gabapentin . She also takes statin and ARB as prescribed . Last LDL was 88, goal is below 70 - she is taking Rosvuastatin and we will recheck labs today   Chronic constipation: t currently taking linzess 145 daily and bowel movements are controlled, she has bowel movements daily and is Bristol scale of 4 occasionally a 6. She has noticed episodes of vomiting with some abdominal pain and will contact GI    Major Depression/GAD: She is seeing Dr. Shea Evans. She is off alprazolam, she has been off hdyroxizine , looks like she is only taking Mirtazapine and states she is feeling all right. Phq 9 is still positive Continue follow up   HTN: BP has been towards low end of normal, felt dizzy when she got up from toilet recently. We will try stopping clonidine in am's , continue Benicar 40/25 , norvasc 2.5 mg and metoprolol, plus clonidine at night, monitor bp at home and let me know if spikes without clonidine    Angina: had evaluation by cardiologist and Echo myoview . She  denies decrease in exercise tolerance, she has some fluttering at night and intermittent chest pain. . She has not taken NTG lately. She has some sob with moderate activity. She is still taking Crestor , she needs to schedule a follow up   OSA: using CPAP all night about  7 hours per night. Continue as prescribed  Unchanged    Morbid obesity: weight is now stable, she is trying to eat healthy  GERD:Seen by Dr.  Purnell Shoemaker , she is on Omeprazole 40 mg daily , she has been vomiting intermittently lately    Migraine: seen at the headache center by [facility]  currently on  gabapentin and Maxalt recently, off topamax, she did not get approved for botox. She continues to have headaches almost daily, she will go back to the office to discuss her symptoms     Hypothyroidism she is currently taking medication daily, she has chronic dry skin and chronic constipation controlled with linzess now  , last TSH finally at goal , taking 112 mcg daily now    DDD lumbar spine: had MRI done, ordered by Ortho and was found to have DDD lumbar spine, she was seen by Dr. Phyllis Ginger and also seeing Dr. Arvella Nigh ( Ortho ) , pain level is 7/10, also has shoulder pain and had a steroid injection recently pain is 7/10    Asthma Moderate persistent : she states there is mold in her house , she has nocturnal cough and SOB with activity, we will refill Flovent at 100 mg dose   Vertigo: she states over the past couple of weeks she has noticed spinning sensation when she moves her head, sometimes associated with nausea but no vomiting, she has tinnitus on both ears, no hearing loss.    Patient Active Problem List  Diagnosis Date Noted   MDD (major depressive disorder), recurrent, in full remission ([place]) 12/26/2021   Polyp of colon    Colon cancer screening    Chronic constipation 12/03/2021   Asthma, well controlled 12/03/2021   Migraine without aura and without status migrainosus, not intractable 12/03/2021   Memory loss 08/27/2021   MDD (major depressive disorder), recurrent episode, mild ([place]) 07/19/2021   Sleep disorder 07/19/2021   Dyslipidemia associated with type 2 diabetes mellitus ([place]) 03/06/2020   MDD (major depressive disorder), recurrent episode, moderate ([place]) 04/07/2019   Chronic low back pain 12/31/2018   Grade II internal hemorrhoids 05/16/2017   Mixed stress and urge urinary incontinence 11/12/2016   Degenerative  arthritis of lumbar spine 11/12/2016   Common migraine with intractable migraine 09/20/2016   Iron deficiency anemia 09/12/2016   GAD (generalized anxiety disorder) 12/20/2015   OSA on CPAP 12/20/2015   Benign neoplasm of sigmoid colon    Chronic pain of multiple joints 06/14/2014   Arthritis, degenerative 06/14/2014   Morbid obesity with BMI of 40.0-44.9, adult ([place]) 06/14/2014   Hypothyroidism due to acquired atrophy of thyroid 08/18/2013   Hypertension goal BP (blood pressure) < 140/90 12/07/2010   Familial multiple lipoprotein-type hyperlipidemia 12/07/2010   Allergic rhinitis 10/13/2008    Past Surgical History:  Procedure Laterality Date   ABDOMINAL HYSTERECTOMY     bladder tach  1995   CESAREAN SECTION     COLONOSCOPY WITH PROPOFOL N/A 05/29/2015   Procedure: COLONOSCOPY WITH PROPOFOL;  Surgeon: Lucilla Lame, MD;  Location: [facility];  Service: Endoscopy;  Laterality: N/A;  Latex allergy sleep apnea - no CPAP machine (yet)   COLONOSCOPY WITH PROPOFOL N/A 12/18/2021   Procedure: COLONOSCOPY WITH PROPOFOL;  Surgeon: Lin Landsman, MD;  Location: [facility];  Service: Gastroenterology;  Laterality: N/A;   ESOPHAGOGASTRODUODENOSCOPY N/A 04/23/2017   Procedure: ESOPHAGOGASTRODUODENOSCOPY (EGD);  Surgeon: Lin Landsman, MD;  Location: [facility];  Service: Gastroenterology;  Laterality: N/A;  Latex allergy sleep apnea   POLYPECTOMY  05/29/2015   Procedure: POLYPECTOMY;  Surgeon: Lucilla Lame, MD;  Location: [facility];  Service: Endoscopy;;    Family History  Problem Relation Age of Onset   Diabetes Sister    Anxiety disorder Sister    Depression Sister    Hypertension Sister    Cirrhosis Mother    Diabetes Mother    Cirrhosis Father    Breast cancer Sister 67   Heart attack Brother    Alcohol abuse Brother    Drug abuse Brother    Prostate cancer Neg Hx    Kidney cancer Neg Hx    Bladder Cancer Neg Hx     Social History    Tobacco Use   Smoking status: Never   Smokeless tobacco: Never  Substance Use Topics   Alcohol use: No    Alcohol/week: 0.0 standard drinks of alcohol     Current Outpatient Medications:    acetaminophen (TYLENOL) 500 MG tablet, Take 2 tablets (1,000 mg total) by mouth every 6 (six) hours as needed., Disp: 30 tablet, Rfl: 0   albuterol (VENTOLIN HFA) 108 (90 Base) MCG/ACT inhaler, INHALE 2 PUFFS BY MOUTH EVERY 6 HOURS AS NEEDED FOR WHEEZING OR SHORTNESS OF BREATH, Disp: 2 each, Rfl: 0   amLODipine (NORVASC) 2.5 MG tablet, Take 1 tablet (2.5 mg total) by mouth daily., Disp: 90 tablet, Rfl: 1   aspirin 81 MG EC tablet, Take 81 mg by mouth daily., Disp: , Rfl:  cloNIDine (CATAPRES) 0.1 MG tablet, Take 1 tablet (0.1 mg total) by mouth 2 (two) times daily., Disp: 180 tablet, Rfl: 1   estradiol (ESTRACE VAGINAL) 0.1 MG/GM vaginal cream, Apply 0.'5mg'$  (pea-sized amount)  just inside the vaginal introitus with a finger-tip on Monday, Wednesday and Friday nights., Disp: 30 g, Rfl: 12   fluticasone (FLONASE) 50 MCG/ACT nasal spray, Place 2 sprays into both nostrils daily as needed for allergies., Disp: 48 mL, Rfl: 0   fluticasone (FLOVENT HFA) 44 MCG/ACT inhaler, Inhale 2 puffs into the lungs 2 (two) times daily., Disp: 3 each, Rfl: 1   gabapentin (NEURONTIN) 400 MG capsule, 1 capsule in the morning and at midday, 2 in the evening, Disp: 360 capsule, Rfl: 1   glucose blood test strip, Use as instructed, Disp: 100 each, Rfl: 12   ketoconazole (NIZORAL) 2 % cream, Apply 1 application. topically daily., Disp: 60 g, Rfl: 0   Lancet Devices (ONE TOUCH DELICA LANCING DEV) MISC, 1 each by Does not apply route daily., Disp: 1 each, Rfl: 0   levothyroxine (SYNTHROID) 112 MCG tablet, Take 1 tablet (112 mcg total) by mouth daily., Disp: 30 tablet, Rfl: 3   LINZESS 145 MCG CAPS capsule, Take 1 capsule by mouth once daily, Disp: 90 capsule, Rfl: 0   metFORMIN (GLUCOPHAGE-XR) 750 MG 24 hr tablet, TAKE 2 TABLETS  BY MOUTH ONCE A DAY WITH BREAKFAST, Disp: 180 tablet, Rfl: 1   metoprolol succinate (TOPROL-XL) 50 MG 24 hr tablet, TAKE 1 TABLET BY MOUTH ONCE A DAY WITH OR IMMEDIATELY FOLLOWING A MEAL., Disp: 90 tablet, Rfl: 1   mirtazapine (REMERON) 7.5 MG tablet, Take 1 tablet (7.5 mg total) by mouth at bedtime., Disp: 90 tablet, Rfl: 0   montelukast (SINGULAIR) 10 MG tablet, Take 1 tablet (10 mg total) by mouth at bedtime., Disp: 90 tablet, Rfl: 1   olmesartan-hydrochlorothiazide (BENICAR HCT) 40-25 MG tablet, Take 1 tablet by mouth daily., Disp: 90 tablet, Rfl: 1   omeprazole (PRILOSEC) 40 MG capsule, Take 1 capsule by mouth once daily, Disp: 90 capsule, Rfl: 1   OneTouch Delica Lancets 09B MISC, 1 each by Does not apply route daily., Disp: 100 each, Rfl: 2   pioglitazone (ACTOS) 15 MG tablet, Take 1 tablet (15 mg total) by mouth daily., Disp: 90 tablet, Rfl: 1   Rimegepant Sulfate (NURTEC) 75 MG TBDP, Take 75 mg by mouth every other day., Disp: 16 tablet, Rfl: 11   rizatriptan (MAXALT) 10 MG tablet, May repeat in 2 hours if needed, Disp: 10 tablet, Rfl: 3   rosuvastatin (CRESTOR) 40 MG tablet, Take 1 tablet (40 mg total) by mouth daily. In place of Atorvastatin, Disp: 90 tablet, Rfl: 3   senna (SENOKOT) 8.6 MG TABS tablet, Take 1 tablet by mouth daily., Disp: , Rfl:    nitroGLYCERIN (NITROSTAT) 0.4 MG SL tablet, Place 1 tablet (0.4 mg total) under the tongue every 5 (five) minutes as needed. (Patient not taking: Reported on 04/03/2022), Disp: 25 tablet, Rfl: 0  Allergies  Allergen Reactions   Latex Swelling    Pt unsure of reaction pt states something happened while in the hospital after surgery.    I personally reviewed active problem list, medication list, allergies, family history, social history, health maintenance with the patient/caregiver today.   ROS  Constitutional: Negative for fever or weight change.  Respiratory: positive  for  nocturnal cough and has intermittent shortness of breath.    Cardiovascular: Negative for chest pain or palpitations.  Gastrointestinal: Negative  for abdominal pain, no bowel changes.  Musculoskeletal: Negative for gait problem or joint swelling.  Skin: Negative for rash.  Neurological: positive for dizziness or headache.  No other specific complaints in a complete review of systems (except as listed in HPI above).   Objective  Vitals:   04/03/22 1004  BP: 118/76  Pulse: 87  Resp: 16  SpO2: 100%  Weight: 222 lb (100.7 kg)  Height: '5\' 1"'$  (1.549 m)    Body mass index is 41.95 kg/m.  Physical Exam  Constitutional: Patient appears well-developed and well-nourished. Obese  No distress.  HEENT: head atraumatic, normocephalic, pupils equal and reactive to light, ears normal TM bilaterally, no nystagmus, neck supple, throat within normal limits Cardiovascular: Normal rate, regular rhythm and normal heart sounds.  No murmur heard. No BLE edema. Pulmonary/Chest: Effort normal and breath sounds normal. No respiratory distress. Abdominal: Soft.  There is no tenderness. Psychiatric: Patient has a normal mood and affect. behavior is normal. Judgment and thought content normal.   Recent Results (from the past 2160 hour(s))  TSH     Status: None   Collection Time: 03/04/22  3:46 PM  Result Value Ref Range   TSH 0.48 0.40 - 4.50 mIU/L  POCT HgB A1C     Status: Abnormal   Collection Time: 04/03/22 10:13 AM  Result Value Ref Range   Hemoglobin A1C 6.2 (A) 4.0 - 5.6 %   HbA1c POC (<> result, manual entry)     HbA1c, POC (prediabetic range)     HbA1c, POC (controlled diabetic range)        PHQ2/9:    04/03/2022   10:12 AM 12/26/2021    1:10 PM 12/03/2021   12:57 PM 10/24/2021   10:33 AM 10/03/2021    9:44 AM  Depression screen PHQ 2/9  Decreased Interest '2  2  2  '$ Down, Depressed, Hopeless '2  2  1  '$ PHQ - 2 Score '4  4  3  '$ Altered sleeping '2  1  1  '$ Tired, decreased energy 2  2  0  Change in appetite 0  1  0  Feeling bad or failure about  yourself  '1  3  1  '$ Trouble concentrating 0  0  0  Moving slowly or fidgety/restless 0  0  0  Suicidal thoughts 0  0  0  PHQ-9 Score '9  11  5  '$ Difficult doing work/chores   Somewhat difficult       Information is confidential and restricted. Go to Review Flowsheets to unlock data.    phq 9 is positive   Fall Risk:    04/03/2022   10:12 AM 12/03/2021   12:56 PM 10/03/2021    9:38 AM 08/06/2021    9:06 AM 05/23/2021    2:53 PM  Fall Risk   Falls in the past year? 0 0 '1 1 1  '$ Number falls in past yr: 0  0 0 1  Injury with Fall? 0  0 0 1  Risk for fall due to : Impaired balance/gait No Fall Risks Impaired balance/gait  Impaired balance/gait  Follow up Falls prevention discussed Falls prevention discussed Falls prevention discussed Falls prevention discussed Falls prevention discussed      Functional Status Survey: Is the patient deaf or have difficulty hearing?: No Does the patient have difficulty seeing, even when wearing glasses/contacts?: No Does the patient have difficulty concentrating, remembering, or making decisions?: No Does the patient have difficulty walking or climbing stairs?: Yes Does the  patient have difficulty dressing or bathing?: Yes Does the patient have difficulty doing errands alone such as visiting a doctor's office or shopping?: Yes    Assessment & Plan  1. Dyslipidemia associated with type 2 diabetes mellitus (HCC)  - POCT HgB A1C - metFORMIN (GLUCOPHAGE-XR) 750 MG 24 hr tablet; TAKE 2 TABLETS BY MOUTH ONCE A DAY WITH BREAKFAST  Dispense: 180 tablet; Refill: 1 - pioglitazone (ACTOS) 15 MG tablet; Take 1 tablet (15 mg total) by mouth daily.  Dispense: 90 tablet; Refill: 1 - Microalbumin / creatinine urine ratio - Lipid panel  2. Hypertension associated with type 2 diabetes mellitus (HCC)  - metoprolol succinate (TOPROL-XL) 50 MG 24 hr tablet; TAKE 1 TABLET BY MOUTH ONCE A DAY WITH OR IMMEDIATELY FOLLOWING A MEAL.  Dispense: 90 tablet; Refill: 1 -  cloNIDine (CATAPRES) 0.1 MG tablet; Take 1 tablet (0.1 mg total) by mouth every evening.  Dispense: 90 tablet; Refill: 1 - amLODipine (NORVASC) 2.5 MG tablet; Take 1 tablet (2.5 mg total) by mouth daily.  Dispense: 90 tablet; Refill: 1 - olmesartan-hydrochlorothiazide (BENICAR HCT) 40-25 MG tablet; Take 1 tablet by mouth daily.  Dispense: 90 tablet; Refill: 1 - COMPLETE METABOLIC PANEL WITH GFR - CBC with Differential/Platelet  3. Morbid obesity ([place])  Discussed with the patient the risk posed by an increased BMI. Discussed importance of portion control, calorie counting and at least 150 minutes of physical activity weekly. Avoid sweet beverages and drink more water. Eat at least 6 servings of fruit and vegetables daily    4. MDD (major depressive disorder), recurrent episode, moderate ([place])   5. Need for immunization against influenza  - Flu Vaccine QUAD 6+ mos PF IM (Fluarix Quad PF)  6. Vertigo  - Ambulatory referral to ENT  7. Hot flashes  - cloNIDine (CATAPRES) 0.1 MG tablet; Take 1 tablet (0.1 mg total) by mouth every evening.  Dispense: 90 tablet; Refill: 1  8. Asthma, moderate persistent, well-controlled  - montelukast (SINGULAIR) 10 MG tablet; Take 1 tablet (10 mg total) by mouth at bedtime.  Dispense: 90 tablet; Refill: 1 - fluticasone (FLOVENT HFA) 110 MCG/ACT inhaler; Inhale 2 puffs into the lungs 2 (two) times daily.  Dispense: 36 g; Refill: 1  9. Hypothyroidism due to acquired atrophy of thyroid   10. Migraine without aura and without status migrainosus, not intractable   11. Perennial allergic rhinitis with seasonal variation  - fluticasone (FLONASE) 50 MCG/ACT nasal spray; Place 2 sprays into both nostrils daily as needed for allergies.  Dispense: 48 mL; Refill: 0  12. Vitamin D deficiency  - VITAMIN D 25 Hydroxy (Vit-D Deficiency, Fractures)  13. Long-term use of high-risk medication  - B12 and Folate Panel

## 2022-04-03 ENCOUNTER — Encounter: Payer: Self-pay | Admitting: Family Medicine

## 2022-04-03 ENCOUNTER — Ambulatory Visit (INDEPENDENT_AMBULATORY_CARE_PROVIDER_SITE_OTHER): Payer: Commercial Managed Care - HMO | Admitting: Family Medicine

## 2022-04-03 VITALS — BP 118/76 | HR 87 | Resp 16 | Ht 61.0 in | Wt 222.0 lb

## 2022-04-03 DIAGNOSIS — R232 Flushing: Secondary | ICD-10-CM

## 2022-04-03 DIAGNOSIS — E034 Atrophy of thyroid (acquired): Secondary | ICD-10-CM

## 2022-04-03 DIAGNOSIS — E559 Vitamin D deficiency, unspecified: Secondary | ICD-10-CM

## 2022-04-03 DIAGNOSIS — Z23 Encounter for immunization: Secondary | ICD-10-CM

## 2022-04-03 DIAGNOSIS — E1169 Type 2 diabetes mellitus with other specified complication: Secondary | ICD-10-CM | POA: Diagnosis not present

## 2022-04-03 DIAGNOSIS — E785 Hyperlipidemia, unspecified: Secondary | ICD-10-CM | POA: Diagnosis not present

## 2022-04-03 DIAGNOSIS — Z79899 Other long term (current) drug therapy: Secondary | ICD-10-CM

## 2022-04-03 DIAGNOSIS — F331 Major depressive disorder, recurrent, moderate: Secondary | ICD-10-CM

## 2022-04-03 DIAGNOSIS — E1159 Type 2 diabetes mellitus with other circulatory complications: Secondary | ICD-10-CM | POA: Diagnosis not present

## 2022-04-03 DIAGNOSIS — G43009 Migraine without aura, not intractable, without status migrainosus: Secondary | ICD-10-CM

## 2022-04-03 DIAGNOSIS — R42 Dizziness and giddiness: Secondary | ICD-10-CM

## 2022-04-03 DIAGNOSIS — J4540 Moderate persistent asthma, uncomplicated: Secondary | ICD-10-CM

## 2022-04-03 DIAGNOSIS — I152 Hypertension secondary to endocrine disorders: Secondary | ICD-10-CM

## 2022-04-03 DIAGNOSIS — J3089 Other allergic rhinitis: Secondary | ICD-10-CM

## 2022-04-03 DIAGNOSIS — J302 Other seasonal allergic rhinitis: Secondary | ICD-10-CM

## 2022-04-03 LAB — POCT GLYCOSYLATED HEMOGLOBIN (HGB A1C): Hemoglobin A1C: 6.2 % — AB (ref 4.0–5.6)

## 2022-04-03 MED ORDER — OLMESARTAN MEDOXOMIL-HCTZ 40-25 MG PO TABS: 1.0000 | ORAL_TABLET | Freq: Every day | ORAL | 1 refills | Status: DC

## 2022-04-03 MED ORDER — CLONIDINE HCL 0.1 MG PO TABS: 0.1000 mg | ORAL_TABLET | Freq: Every evening | ORAL | 1 refills | Status: DC

## 2022-04-03 MED ORDER — AMLODIPINE BESYLATE 2.5 MG PO TABS: 2.5000 mg | ORAL_TABLET | Freq: Every day | ORAL | 1 refills | Status: DC

## 2022-04-03 MED ORDER — METOPROLOL SUCCINATE ER 50 MG PO TB24: ORAL_TABLET | ORAL | 1 refills | Status: DC

## 2022-04-03 MED ORDER — FLUTICASONE PROPIONATE HFA 110 MCG/ACT IN AERO: 2.0000 | INHALATION_SPRAY | Freq: Two times a day (BID) | RESPIRATORY_TRACT | 1 refills | Status: DC

## 2022-04-03 MED ORDER — PIOGLITAZONE HCL 15 MG PO TABS: 15.0000 mg | ORAL_TABLET | Freq: Every day | ORAL | 1 refills | Status: DC

## 2022-04-03 MED ORDER — FLUTICASONE PROPIONATE 50 MCG/ACT NA SUSP: 2.0000 | Freq: Every day | NASAL | 0 refills | Status: AC | PRN

## 2022-04-03 MED ORDER — MONTELUKAST SODIUM 10 MG PO TABS: 10.0000 mg | ORAL_TABLET | Freq: Every day | ORAL | 1 refills | Status: DC

## 2022-04-03 MED ORDER — METFORMIN HCL ER 750 MG PO TB24: ORAL_TABLET | ORAL | 1 refills | Status: DC

## 2022-04-08 ENCOUNTER — Other Ambulatory Visit: Payer: Self-pay

## 2022-04-08 ENCOUNTER — Ambulatory Visit (INDEPENDENT_AMBULATORY_CARE_PROVIDER_SITE_OTHER): Payer: No Typology Code available for payment source | Admitting: Psychiatry

## 2022-04-08 ENCOUNTER — Encounter: Payer: Self-pay | Admitting: Psychiatry

## 2022-04-08 VITALS — BP 114/71 | HR 97 | Temp 97.9°F | Wt 221.2 lb

## 2022-04-08 DIAGNOSIS — D509 Iron deficiency anemia, unspecified: Secondary | ICD-10-CM

## 2022-04-08 DIAGNOSIS — F411 Generalized anxiety disorder: Secondary | ICD-10-CM

## 2022-04-08 DIAGNOSIS — G4701 Insomnia due to medical condition: Secondary | ICD-10-CM

## 2022-04-08 DIAGNOSIS — R413 Other amnesia: Secondary | ICD-10-CM

## 2022-04-08 DIAGNOSIS — F3342 Major depressive disorder, recurrent, in full remission: Secondary | ICD-10-CM

## 2022-04-08 LAB — CBC WITH DIFFERENTIAL/PLATELET
Absolute Monocytes: 697 {cells}/uL (ref 200–950)
Basophils Absolute: 74 {cells}/uL (ref 0–200)
Basophils Relative: 0.9 %
Eosinophils Absolute: 180 {cells}/uL (ref 15–500)
Eosinophils Relative: 2.2 %
HCT: 34.1 % — ABNORMAL LOW (ref 35.0–45.0)
Hemoglobin: 11.5 g/dL — ABNORMAL LOW (ref 11.7–15.5)
Lymphs Abs: 2329 {cells}/uL (ref 850–3900)
MCH: 28.4 pg (ref 27.0–33.0)
MCHC: 33.7 g/dL (ref 32.0–36.0)
MCV: 84.2 fL (ref 80.0–100.0)
MPV: 10.5 fL (ref 7.5–12.5)
Monocytes Relative: 8.5 %
Neutro Abs: 4920 {cells}/uL (ref 1500–7800)
Neutrophils Relative %: 60 %
Platelets: 305 10*3/uL (ref 140–400)
RBC: 4.05 Million/uL (ref 3.80–5.10)
RDW: 14.3 % (ref 11.0–15.0)
Total Lymphocyte: 28.4 %
WBC: 8.2 10*3/uL (ref 3.8–10.8)

## 2022-04-08 LAB — MICROALBUMIN / CREATININE URINE RATIO
Creatinine, Urine: 196 mg/dL (ref 20–275)
Microalb Creat Ratio: 8 mcg/mg creat (ref <30)
Microalb, Ur: 1.6 mg/dL

## 2022-04-08 LAB — COMPLETE METABOLIC PANEL WITH GFR
AG Ratio: 1.5 (calc) (ref 1.0–2.5)
ALT: 9 U/L (ref 6–29)
AST: 14 U/L (ref 10–35)
Albumin: 4.1 g/dL (ref 3.6–5.1)
Alkaline phosphatase (APISO): 48 U/L (ref 37–153)
BUN: 23 mg/dL (ref 7–25)
CO2: 28 mmol/L (ref 20–32)
Calcium: 9.6 mg/dL (ref 8.6–10.4)
Chloride: 102 mmol/L (ref 98–110)
Creat: 0.74 mg/dL (ref 0.50–1.05)
Globulin: 2.7 g/dL (calc) (ref 1.9–3.7)
Glucose, Bld: 92 mg/dL (ref 65–99)
Potassium: 3.9 mmol/L (ref 3.5–5.3)
Sodium: 143 mmol/L (ref 135–146)
Total Bilirubin: 0.3 mg/dL (ref 0.2–1.2)
Total Protein: 6.8 g/dL (ref 6.1–8.1)
eGFR: 91 mL/min/{1.73_m2} (ref >=60)

## 2022-04-08 LAB — IRON,TIBC AND FERRITIN PANEL
%SAT: 17 % (calc) (ref 16–45)
Ferritin: 47 ng/mL (ref 16–288)
Iron: 69 ug/dL (ref 45–160)
TIBC: 398 mcg/dL (calc) (ref 250–450)

## 2022-04-08 LAB — LIPID PANEL
Cholesterol: 138 mg/dL
HDL: 57 mg/dL
LDL Cholesterol (Calc): 63 mg/dL
Non-HDL Cholesterol (Calc): 81 mg/dL
Total CHOL/HDL Ratio: 2.4 (calc)
Triglycerides: 98 mg/dL

## 2022-04-08 LAB — B12 AND FOLATE PANEL
Folate: 9.9 ng/mL
Vitamin B-12: 328 pg/mL (ref 200–1100)

## 2022-04-08 LAB — TEST AUTHORIZATION

## 2022-04-08 LAB — VITAMIN D 25 HYDROXY (VIT D DEFICIENCY, FRACTURES): Vit D, 25-Hydroxy: 43 ng/mL (ref 30–100)

## 2022-04-08 MED ORDER — MIRTAZAPINE 7.5 MG PO TABS: 7.5000 mg | ORAL_TABLET | Freq: Every day | ORAL | 1 refills | Status: DC

## 2022-04-08 NOTE — Progress Notes (Signed)
[facility] MD OP Progress Note  04/08/2022 2:17 PM Beverly Barrett  MRN:  [id number]  Chief Complaint:  Chief Complaint  Patient presents with   Follow-up: 61 year old African-American female who has a history of depression, anxiety, presented for medication management.   HPI: Beverly Barrett is a 61 year old African-American female, married, lives in [place], has a history of MDD, GAD, chronic pain, arthritis, history of OSA on CPAP, hypothyroidism, migraine headaches was evaluated in office today.  Patient today reports she is currently doing well with regards to her mood.  Denies any significant depression.  She does have anxiety however has been coping well, it does not affect her day-to-day functioning.  Reports sleep is overall good.  Uses her CPAP device.  Patient is compliant on the mirtazapine.  Denies side effects.  Would like to stay on this dosage.  Continues to have short-term memory problems although nothing significant. Slums was completed in session today.  Patient denies any suicidality, homicidality or perceptual disturbances.  Patient denies any other concerns today.  Visit Diagnosis:    ICD-10-CM   1. MDD (major depressive disorder), recurrent, in full remission ([facility])  F33.42     2. GAD (generalized anxiety disorder)  F41.1 mirtazapine (REMERON) 7.5 MG tablet    3. Insomnia due to medical condition  G47.01    mood, OSA on CPAP    4. Memory loss  R41.3       Past Psychiatric History: Reviewed past psychiatric history from progress note on 01/21/2018.  Past Medical History:  Past Medical History:  Diagnosis Date   Anginal pain ([facility])    none in approx 2 yrs   Anxiety    Asthma    Chronic lower back pain    Common migraine with intractable migraine 09/20/2016   Depression    suicidal ideation   Diabetes mellitus without complication (HCC)    GERD (gastroesophageal reflux disease)    Headache    migraines -3x/wk   Hyperlipidemia    Hypertension     Hypothyroidism    Motion sickness    cars   Multilevel degenerative disc disease    Shortness of breath dyspnea    Sleep apnea    CPAP   Vertigo    Wears dentures    partial upper    Past Surgical History:  Procedure Laterality Date   ABDOMINAL HYSTERECTOMY     bladder tach  1995   CESAREAN SECTION     COLONOSCOPY WITH PROPOFOL N/A 05/29/2015   Procedure: COLONOSCOPY WITH PROPOFOL;  Surgeon: Lucilla Lame, MD;  Location: [facility];  Service: Endoscopy;  Laterality: N/A;  Latex allergy sleep apnea - no CPAP machine (yet)   COLONOSCOPY WITH PROPOFOL N/A 12/18/2021   Procedure: COLONOSCOPY WITH PROPOFOL;  Surgeon: Lin Landsman, MD;  Location: [facility] ENDOSCOPY;  Service: Gastroenterology;  Laterality: N/A;   ESOPHAGOGASTRODUODENOSCOPY N/A 04/23/2017   Procedure: ESOPHAGOGASTRODUODENOSCOPY (EGD);  Surgeon: Lin Landsman, MD;  Location: [facility];  Service: Gastroenterology;  Laterality: N/A;  Latex allergy sleep apnea   POLYPECTOMY  05/29/2015   Procedure: POLYPECTOMY;  Surgeon: Lucilla Lame, MD;  Location: [facility];  Service: Endoscopy;;    Family Psychiatric History: Reviewed family psychiatric history from progress note on 01/21/2018.  Family History:  Family History  Problem Relation Age of Onset   Diabetes Sister    Anxiety disorder Sister    Depression Sister    Hypertension Sister    Cirrhosis Mother    Diabetes Mother  Cirrhosis Father    Breast cancer Sister 89   Heart attack Brother    Alcohol abuse Brother    Drug abuse Brother    Prostate cancer Neg Hx    Kidney cancer Neg Hx    Bladder Cancer Neg Hx     Social History: Reviewed social history from progress note on 01/21/2018. Social History   Socioeconomic History   Marital status: Married    Spouse name: IT sales professional   Number of children: 3   Years of education: Not on file   Highest education level: GED or equivalent  Occupational History   Not on file  Tobacco  Use   Smoking status: Never   Smokeless tobacco: Never  Vaping Use   Vaping Use: Never used  Substance and Sexual Activity   Alcohol use: No    Alcohol/week: 0.0 standard drinks of alcohol   Drug use: No   Sexual activity: Not Currently    Partners: Male  Other Topics Concern   Not on file  Social History Narrative   Lives with husband and one daughter other kids are out of the house   She does not work, husband on disability    Social Determinants of Health   Financial Resource Strain: High Risk (03/06/2020)   Overall Financial Resource Strain (CARDIA)    Difficulty of Paying Living Expenses: Very hard  Food Insecurity: Food Insecurity Present (07/14/2019)   Hunger Vital Sign    Worried About [place] in the Last Year: Sometimes true    [place] in the Last Year: Sometimes true  Transportation Needs: No Transportation Needs (07/14/2019)   PRAPARE - Hydrologist (Medical): No    Lack of Transportation (Non-Medical): No  Physical Activity: Insufficiently Active (07/14/2019)   Exercise Vital Sign    Days of Exercise per Week: 6 days    Minutes of Exercise per Session: 10 min  Stress: Stress Concern Present (07/14/2019)   [facility]    Feeling of Stress : To some extent  Social Connections: Moderately Integrated (07/14/2019)   Social Connection and Isolation Panel [NHANES]    Frequency of Communication with Friends and Family: More than three times a week    Frequency of Social Gatherings with Friends and Family: More than three times a week    Attends Religious Services: More than 4 times per year    Active Member of [employer] or Organizations: No    Attends Archivist Meetings: Never    Marital Status: Married    Allergies:  Allergies  Allergen Reactions   Latex Swelling    Pt unsure of reaction pt states something happened while in the hospital after  surgery.    Metabolic Disorder Labs: Lab Results  Component Value Date   HGBA1C 6.2 (A) 04/03/2022   MPG 148 08/06/2021   MPG 162.81 08/28/2019   No results found for: "PROLACTIN" Lab Results  Component Value Date   CHOL 138 04/03/2022   TRIG 98 04/03/2022   HDL 57 04/03/2022   CHOLHDL 2.4 04/03/2022   VLDL 44 (H) 02/12/2017   LDLCALC 63 04/03/2022   LDLCALC 88 03/12/2021   Lab Results  Component Value Date   TSH 0.48 03/04/2022   TSH 0.01 (L) 12/03/2021    Therapeutic Level Labs: No results found for: "LITHIUM" No results found for: "VALPROATE" No results found for: "CBMZ"  Current Medications: Current Outpatient Medications  Medication Sig Dispense Refill   acetaminophen (TYLENOL) 500 MG tablet Take 2 tablets (1,000 mg total) by mouth every 6 (six) hours as needed. 30 tablet 0   albuterol (VENTOLIN HFA) 108 (90 Base) MCG/ACT inhaler INHALE 2 PUFFS BY MOUTH EVERY 6 HOURS AS NEEDED FOR WHEEZING OR SHORTNESS OF BREATH 2 each 0   amLODipine (NORVASC) 2.5 MG tablet Take 1 tablet (2.5 mg total) by mouth daily. 90 tablet 1   aspirin 81 MG EC tablet Take 81 mg by mouth daily.     cloNIDine (CATAPRES) 0.1 MG tablet Take 1 tablet (0.1 mg total) by mouth every evening. 90 tablet 1   estradiol (ESTRACE VAGINAL) 0.1 MG/GM vaginal cream Apply 0.'5mg'$  (pea-sized amount)  just inside the vaginal introitus with a finger-tip on Monday, Wednesday and Friday nights. 30 g 12   fluticasone (FLONASE) 50 MCG/ACT nasal spray Place 2 sprays into both nostrils daily as needed for allergies. 48 mL 0   fluticasone (FLOVENT HFA) 110 MCG/ACT inhaler Inhale 2 puffs into the lungs 2 (two) times daily. 36 g 1   gabapentin (NEURONTIN) 400 MG capsule 1 capsule in the morning and at midday, 2 in the evening 360 capsule 1   glucose blood test strip Use as instructed 100 each 12   ketoconazole (NIZORAL) 2 % cream Apply 1 application. topically daily. 60 g 0   Lancet Devices (ONE TOUCH DELICA LANCING DEV) MISC  1 each by Does not apply route daily. 1 each 0   levothyroxine (SYNTHROID) 112 MCG tablet Take 1 tablet (112 mcg total) by mouth daily. 30 tablet 3   LINZESS 145 MCG CAPS capsule Take 1 capsule by mouth once daily 90 capsule 0   metFORMIN (GLUCOPHAGE-XR) 750 MG 24 hr tablet TAKE 2 TABLETS BY MOUTH ONCE A DAY WITH BREAKFAST 180 tablet 1   metoprolol succinate (TOPROL-XL) 50 MG 24 hr tablet TAKE 1 TABLET BY MOUTH ONCE A DAY WITH OR IMMEDIATELY FOLLOWING A MEAL. 90 tablet 1   montelukast (SINGULAIR) 10 MG tablet Take 1 tablet (10 mg total) by mouth at bedtime. 90 tablet 1   nitroGLYCERIN (NITROSTAT) 0.4 MG SL tablet Place 1 tablet (0.4 mg total) under the tongue every 5 (five) minutes as needed. 25 tablet 0   olmesartan-hydrochlorothiazide (BENICAR HCT) 40-25 MG tablet Take 1 tablet by mouth daily. 90 tablet 1   omeprazole (PRILOSEC) 40 MG capsule Take 1 capsule by mouth once daily 90 capsule 1   OneTouch Delica Lancets 19Q MISC 1 each by Does not apply route daily. 100 each 2   pioglitazone (ACTOS) 15 MG tablet Take 1 tablet (15 mg total) by mouth daily. 90 tablet 1   Rimegepant Sulfate (NURTEC) 75 MG TBDP Take 75 mg by mouth every other day. 16 tablet 11   rizatriptan (MAXALT) 10 MG tablet May repeat in 2 hours if needed 10 tablet 3   rosuvastatin (CRESTOR) 40 MG tablet Take 1 tablet (40 mg total) by mouth daily. In place of Atorvastatin 90 tablet 3   senna (SENOKOT) 8.6 MG TABS tablet Take 1 tablet by mouth daily.     mirtazapine (REMERON) 7.5 MG tablet Take 1 tablet (7.5 mg total) by mouth at bedtime. 90 tablet 1   No current facility-administered medications for this visit.     Musculoskeletal: Strength & Muscle Tone: within normal limits Gait & Station: walks with cane Patient leans: N/A  Psychiatric Specialty Exam: Review of Systems  Psychiatric/Behavioral:  The patient is nervous/anxious.   All  other systems reviewed and are negative.   Blood pressure 114/71, pulse 97, temperature  97.9 F (36.6 C), temperature source Temporal, weight 221 lb 3.2 oz (100.3 kg).Body mass index is 41.8 kg/m.  General Appearance: Casual  Eye Contact:  Fair  Speech:  Clear and Coherent  Volume:  Normal  Mood:  Anxious  Affect:  Congruent  Thought Process:  Goal Directed and Descriptions of Associations: Intact  Orientation:  Full (Time, Place, and Person)  Thought Content: Logical   Suicidal Thoughts:  No  Homicidal Thoughts:  No  Memory:  Immediate;   Fair Recent;   Fair Remote;   Fair  Judgement:  Fair  Insight:  Fair  Psychomotor Activity:  Normal  Concentration:  Concentration: Fair and Attention Span: Fair  Recall:  [employer] of Knowledge: Fair  Language: Fair  Akathisia:  No  Handed:  Right  AIMS (if indicated): not done  Assets:  Communication Skills Desire for Improvement Housing Resilience Social Support  ADL's:  Intact  Cognition: WNL  Sleep:  Fair   Screenings: [facility] Office Visit from 12/26/2021 in [facility] Office Visit from 10/24/2021 in [facility] Total Score 0 0      GAD-7    [facility] Visit from 04/08/2022 in [facility] Office Visit from 12/26/2021 in [facility] Office Visit from 12/03/2021 in [facility] Office Visit from 03/19/2018 in [facility] Office Visit from 03/04/2016 in [facility]  Total GAD-7 Score '10 3 12 14 18      '$ PHQ2-9    [place] Visit from 04/08/2022 in [facility] Office Visit from 04/03/2022 in [facility] Office Visit from 12/26/2021 in [facility] Office Visit from 12/03/2021 in [facility] Office Visit from 10/24/2021 in [facility]  PHQ-2 Total Score '3 4 2 4 2  '$ PHQ-9 Total Score '7 9 5 11 5       '$ [facility] Visit from 04/08/2022 in [facility] Office Visit from 12/26/2021 in [facility] Admission (Discharged) from 12/18/2021 in [facility] No Risk No Risk No Risk        Assessment and Plan: Beverly Barrett is a 60 year old African-American female, married, lives in [place], has a history of MDD, GAD, insomnia, OSA on CPAP, migraine headaches, hypothyroidism was evaluated in office today.  Patient is currently stable.  Plan MDD in remission Mirtazapine 7.5 mg p.o. nightly.  GAD-improving Mirtazapine 7.5 mg p.o. nightly. Patient is not interested in dosage increase.  Insomnia-stable Continue CPAP for OSA Mirtazapine as prescribed  Memory loss-stable MMSE-04/08/2022-27 out of 30. Unchanged since her last one on 08/27/2021.   Follow-up in clinic in 3 to 4 months or sooner if needed.  This note was generated in part or whole with voice recognition software. Voice recognition is usually quite accurate but there are transcription errors that can and very often do occur. I apologize for any typographical errors that were not detected and corrected.       Ursula Alert, MD 04/08/2022, 2:17 PM

## 2022-04-08 NOTE — Progress Notes (Signed)
Iron studies added

## 2022-04-10 ENCOUNTER — Other Ambulatory Visit: Payer: Self-pay

## 2022-04-10 DIAGNOSIS — G8929 Other chronic pain: Secondary | ICD-10-CM

## 2022-04-26 ENCOUNTER — Other Ambulatory Visit: Payer: Self-pay | Admitting: Orthopedic Surgery

## 2022-04-26 DIAGNOSIS — M75102 Unspecified rotator cuff tear or rupture of left shoulder, not specified as traumatic: Secondary | ICD-10-CM

## 2022-05-03 ENCOUNTER — Other Ambulatory Visit: Payer: Self-pay

## 2022-05-06 ENCOUNTER — Encounter: Payer: Self-pay | Admitting: Gastroenterology

## 2022-05-06 ENCOUNTER — Ambulatory Visit (INDEPENDENT_AMBULATORY_CARE_PROVIDER_SITE_OTHER): Payer: Commercial Managed Care - HMO | Admitting: Gastroenterology

## 2022-05-06 VITALS — BP 100/66 | HR 77 | Temp 98.3°F | Ht 61.0 in | Wt 221.5 lb

## 2022-05-06 DIAGNOSIS — R6881 Early satiety: Secondary | ICD-10-CM | POA: Diagnosis not present

## 2022-05-06 DIAGNOSIS — R110 Nausea: Secondary | ICD-10-CM

## 2022-05-06 MED ORDER — OMEPRAZOLE 40 MG PO CPDR: 40.0000 mg | DELAYED_RELEASE_CAPSULE | Freq: Two times a day (BID) | ORAL | 0 refills | Status: DC

## 2022-05-06 NOTE — Progress Notes (Signed)
Cephas Darby, MD [address]  [place]  [place],  [address]  Main: [phone]  Fax: [phone]    Gastroenterology Consultation  Referring Provider:     Steele Sizer, MD Primary Care Physician:  Steele Sizer, MD Primary Gastroenterologist:  Dr. Cephas Darby Reason for Consultation:     Chronic constipation, GERD, new onset of nausea and early satiety        HPI:   Beverly Barrett is a 61 y.o. female referred by Dr. Steele Sizer, MD  for consultation & management of chronic constipation and GERD.  Patient reports that she has been experiencing severe constipation, having hard bowel movement about once a week associated with significant straining.  Scant blood on wiping.  She lost about 10 pounds intentionally.  She has been experiencing epigastric pain associated with nausea.  Patient is taking MiraLAX with no relief.  Labs revealed no evidence of anemia.  Follow-up visit 06/27/2021 Patient is here for follow-up of constipation and GERD.  She tried Linzess 145 MCG samples which helped, currently on prescription.  Reports having bowel movements daily.  She is concerned about nausea occurring on a daily basis not associated with vomiting.  She takes omeprazole 40 mg daily for chronic GERD.  Patient is gaining weight.  Patient is accompanied by her daughter today.  Follow-up visit 10/22/2021 Patient is here for follow-up of constipation and upper abdominal pain.  She ran out of Linzess refills due to change in her pharmacy coverage.  She is taking MiraLAX but reports irregular bowel habits associated with significant straining, abdominal bloating.  She also reports upper abdominal discomfort.  She is taking omeprazole 40 mg daily.  Follow-up visit 05/06/2022 Patient is here for follow-up of new onset of nausea and early satiety.  Her symptoms started about a week ago, she started feeling nauseous when she wakes up in the morning, and reports feeling full  before finishing her meal, drinks baking soda which helps to finish her meal.  She continues to take omeprazole 40 mg daily before breakfast.  Her weight has been stable.  She reports that the Linzess has been helping and moves her bowels regularly.  She denies any abdominal pain or back pain.  Her hemoglobin A1c has been stable and gradually improving.  She denies any change in her diabetes medications.  NSAIDs: None  Antiplts/Anticoagulants/Anti thrombotics: None  Patient does not smoke or drink alcohol.  She denies family history of GU malignancy  GI Procedures:  Upper endoscopy 04/23/2017 - Normal duodenal bulb and second portion of the duodenum. - A single gastric polyp. Resected and retrieved. - Erythematous mucosa in the gastric body. Biopsied. - Normal gastroesophageal junction and esophagus.    Colonoscopy 05/29/2015 - Two 2 to 3 mm polyps in the sigmoid colon. Resected and retrieved. - Diverticulosis in the entire examined colon. - Non-bleeding internal hemorrhoids. Colon, polyp(s), sigmoid - SMALL AMOUNTS OF FRAGMENTED SUPERFICIAL BENIGN COLONIC EPITHELIUM. - NO INTACT COLONIC MUCOSA OR POLYPOID ARCHITECTURE IDENTIFIED. - NO ADENOMATOUS CHANGE OR MALIGNANCY IDENTIFIED.  Past Medical History:  Diagnosis Date   Anginal pain ([facility])    none in approx 2 yrs   Anxiety    Asthma    Chronic lower back pain    Common migraine with intractable migraine 09/20/2016   Depression    suicidal ideation   Diabetes mellitus without complication (HCC)    GERD (gastroesophageal reflux disease)    Headache    migraines -3x/wk   Hyperlipidemia  Hypertension    Hypothyroidism    Motion sickness    cars   Multilevel degenerative disc disease    Shortness of breath dyspnea    Sleep apnea    CPAP   Vertigo    Wears dentures    partial upper    Past Surgical History:  Procedure Laterality Date   ABDOMINAL HYSTERECTOMY     bladder tach  1995   CESAREAN SECTION     COLONOSCOPY  WITH PROPOFOL N/A 05/29/2015   Procedure: COLONOSCOPY WITH PROPOFOL;  Surgeon: Lucilla Lame, MD;  Location: [facility];  Service: Endoscopy;  Laterality: N/A;  Latex allergy sleep apnea - no CPAP machine (yet)   COLONOSCOPY WITH PROPOFOL N/A 12/18/2021   Procedure: COLONOSCOPY WITH PROPOFOL;  Surgeon: Lin Landsman, MD;  Location: [facility] ENDOSCOPY;  Service: Gastroenterology;  Laterality: N/A;   ESOPHAGOGASTRODUODENOSCOPY N/A 04/23/2017   Procedure: ESOPHAGOGASTRODUODENOSCOPY (EGD);  Surgeon: Lin Landsman, MD;  Location: [facility];  Service: Gastroenterology;  Laterality: N/A;  Latex allergy sleep apnea   POLYPECTOMY  05/29/2015   Procedure: POLYPECTOMY;  Surgeon: Lucilla Lame, MD;  Location: [facility];  Service: Endoscopy;;   Current Outpatient Medications:    acetaminophen (TYLENOL) 500 MG tablet, Take 2 tablets (1,000 mg total) by mouth every 6 (six) hours as needed., Disp: 30 tablet, Rfl: 0   albuterol (VENTOLIN HFA) 108 (90 Base) MCG/ACT inhaler, INHALE 2 PUFFS BY MOUTH EVERY 6 HOURS AS NEEDED FOR WHEEZING OR SHORTNESS OF BREATH, Disp: 2 each, Rfl: 0   amLODipine (NORVASC) 2.5 MG tablet, Take 1 tablet (2.5 mg total) by mouth daily., Disp: 90 tablet, Rfl: 1   aspirin 81 MG EC tablet, Take 81 mg by mouth daily., Disp: , Rfl:    atorvastatin (LIPITOR) 40 MG tablet, Take 1 tablet by mouth at bedtime., Disp: , Rfl:    cloNIDine (CATAPRES) 0.1 MG tablet, Take 1 tablet (0.1 mg total) by mouth every evening., Disp: 90 tablet, Rfl: 1   diclofenac (VOLTAREN) 75 MG EC tablet, Take 1 tablet by mouth every 12 (twelve) hours., Disp: , Rfl:    estradiol (ESTRACE VAGINAL) 0.1 MG/GM vaginal cream, Apply 0.'5mg'$  (pea-sized amount)  just inside the vaginal introitus with a finger-tip on Monday, Wednesday and Friday nights., Disp: 30 g, Rfl: 12   fluticasone (FLONASE) 50 MCG/ACT nasal spray, Place 2 sprays into both nostrils daily as needed for allergies., Disp: 48 mL, Rfl: 0    fluticasone (FLOVENT HFA) 110 MCG/ACT inhaler, Inhale 2 puffs into the lungs 2 (two) times daily., Disp: 36 g, Rfl: 1   gabapentin (NEURONTIN) 400 MG capsule, 1 capsule in the morning and at midday, 2 in the evening, Disp: 360 capsule, Rfl: 1   glucose blood test strip, Use as instructed, Disp: 100 each, Rfl: 12   ketoconazole (NIZORAL) 2 % cream, Apply 1 application. topically daily., Disp: 60 g, Rfl: 0   Lancet Devices (ONE TOUCH DELICA LANCING DEV) MISC, 1 each by Does not apply route daily., Disp: 1 each, Rfl: 0   levothyroxine (SYNTHROID) 112 MCG tablet, Take 1 tablet (112 mcg total) by mouth daily., Disp: 30 tablet, Rfl: 3   LINZESS 145 MCG CAPS capsule, Take 1 capsule by mouth once daily, Disp: 90 capsule, Rfl: 0   metFORMIN (GLUCOPHAGE-XR) 750 MG 24 hr tablet, TAKE 2 TABLETS BY MOUTH ONCE A DAY WITH BREAKFAST, Disp: 180 tablet, Rfl: 1   metoprolol succinate (TOPROL-XL) 50 MG 24 hr tablet, TAKE 1 TABLET BY MOUTH  ONCE A DAY WITH OR IMMEDIATELY FOLLOWING A MEAL., Disp: 90 tablet, Rfl: 1   mirtazapine (REMERON) 7.5 MG tablet, Take 1 tablet (7.5 mg total) by mouth at bedtime., Disp: 90 tablet, Rfl: 1   montelukast (SINGULAIR) 10 MG tablet, Take 1 tablet (10 mg total) by mouth at bedtime., Disp: 90 tablet, Rfl: 1   nitroGLYCERIN (NITROSTAT) 0.4 MG SL tablet, Place 1 tablet (0.4 mg total) under the tongue every 5 (five) minutes as needed., Disp: 25 tablet, Rfl: 0   olmesartan-hydrochlorothiazide (BENICAR HCT) 40-25 MG tablet, Take 1 tablet by mouth daily., Disp: 90 tablet, Rfl: 1   omeprazole (PRILOSEC) 40 MG capsule, Take 1 capsule (40 mg total) by mouth 2 (two) times daily before a meal., Disp: 60 capsule, Rfl: 0   OneTouch Delica Lancets 52D MISC, 1 each by Does not apply route daily., Disp: 100 each, Rfl: 2   pioglitazone (ACTOS) 15 MG tablet, Take 1 tablet (15 mg total) by mouth daily., Disp: 90 tablet, Rfl: 1   Rimegepant Sulfate (NURTEC) 75 MG TBDP, Take 75 mg by mouth every other day.,  Disp: 16 tablet, Rfl: 11   rizatriptan (MAXALT) 10 MG tablet, May repeat in 2 hours if needed, Disp: 10 tablet, Rfl: 3   rosuvastatin (CRESTOR) 40 MG tablet, Take 1 tablet (40 mg total) by mouth daily. In place of Atorvastatin, Disp: 90 tablet, Rfl: 3   senna (SENOKOT) 8.6 MG TABS tablet, Take 1 tablet by mouth daily., Disp: , Rfl:     Family History  Problem Relation Age of Onset   Diabetes Sister    Anxiety disorder Sister    Depression Sister    Hypertension Sister    Cirrhosis Mother    Diabetes Mother    Cirrhosis Father    Breast cancer Sister 63   Heart attack Brother    Alcohol abuse Brother    Drug abuse Brother    Prostate cancer Neg Hx    Kidney cancer Neg Hx    Bladder Cancer Neg Hx      Social History   Tobacco Use   Smoking status: Never   Smokeless tobacco: Never  Vaping Use   Vaping Use: Never used  Substance Use Topics   Alcohol use: No    Alcohol/week: 0.0 standard drinks of alcohol   Drug use: No    Allergies as of 05/06/2022 - Review Complete 05/06/2022  Allergen Reaction Noted   Latex Swelling 04/24/2015    Review of Systems:    All systems reviewed and negative except where noted in HPI.   Physical Exam:  BP 100/66 (BP Location: Left Arm, Patient Position: Sitting, Cuff Size: Large)   Pulse 77   Temp 98.3 F (36.8 C) (Oral)   Ht '5\' 1"'$  (1.549 m)   Wt 221 lb 8 oz (100.5 kg)   BMI 41.85 kg/m  No LMP recorded. Patient has had a hysterectomy.  General:   Alert,  Well-developed, well-nourished, pleasant and cooperative in NAD Head:  Normocephalic and atraumatic. Eyes:  Sclera clear, no icterus.   Conjunctiva pink. Ears:  Normal auditory acuity. Nose:  No deformity, discharge, or lesions. Mouth:  No deformity or lesions,oropharynx pink & moist. Neck:  Supple; no masses or thyromegaly. Lungs:  Respirations even and unlabored.  Clear throughout to auscultation.   No wheezes, crackles, or rhonchi. No acute distress. Heart:  Regular rate and  rhythm; no murmurs, clicks, rubs, or gallops. Abdomen:  Normal bowel sounds. Soft, obese, non-tender and non-distended without  masses, hepatosplenomegaly or hernias noted.  No guarding or rebound tenderness.   Rectal: Not performed Msk:  Symmetrical without gross deformities. Good, equal movement & strength bilaterally. Pulses:  Normal pulses noted. Extremities:  No clubbing or edema.  No cyanosis. Neurologic:  Alert and oriented x3;  grossly normal neurologically. Skin:  Intact without significant lesions or rashes. No jaundice. Psych:  Alert and cooperative. Normal mood and affect.  Imaging Studies:   Assessment and Plan:   Beverly Barrett is a 61 y.o. pleasant African-American female with metabolic syndrome, BMI 42 is seen in for follow-up of chronic GERD and chronic severe constipation and 1 week history of nausea, early satiety  Nausea, early satiety without abdominal pain Advised patient to increase omeprazole to 40 mg twice daily before meals for 1 month and see if it helps She had normal gastric emptying study in the past CT abdomen and pelvis in 2021 revealed normal-appearing pancreas, no evidence of cholelithiasis If her symptoms are persistent, rule out H. pylori infection, evaluate for biliary pathology, reimage if needed  Chronic constipation Continue Linzess 145 MCG daily, prescription sent to her current pharmacy Reiterated on high-fiber diet and adequate intake of water  Chronic GERD Discussed about antireflux lifestyle Increase omeprazole 40 mg twice daily before meals for 1 month Normal gastric emptying study CT abdomen pelvis with contrast in 07/2019 was unremarkable EGD in 2018 was unremarkable  Colon cancer screening Recommend colonoscopy in 04/2025  Follow up in 6 months   Cephas Darby, MD

## 2022-05-12 ENCOUNTER — Other Ambulatory Visit: Payer: Commercial Managed Care - HMO

## 2022-05-13 ENCOUNTER — Ambulatory Visit
Admission: RE | Admit: 2022-05-13 | Discharge: 2022-05-13 | Disposition: A | Discharge status: Home / Self Care | Payer: Commercial Managed Care - HMO | Source: Ambulatory Visit | Attending: Orthopedic Surgery | Admitting: Orthopedic Surgery

## 2022-05-13 DIAGNOSIS — M75102 Unspecified rotator cuff tear or rupture of left shoulder, not specified as traumatic: Secondary | ICD-10-CM

## 2022-05-14 ENCOUNTER — Encounter: Payer: Self-pay | Admitting: Family Medicine

## 2022-05-15 ENCOUNTER — Ambulatory Visit (INDEPENDENT_AMBULATORY_CARE_PROVIDER_SITE_OTHER): Payer: Commercial Managed Care - HMO | Admitting: Nurse Practitioner

## 2022-05-15 ENCOUNTER — Encounter: Payer: Self-pay | Admitting: Nurse Practitioner

## 2022-05-15 ENCOUNTER — Other Ambulatory Visit: Payer: Self-pay

## 2022-05-15 VITALS — BP 130/76 | HR 98 | Temp 98.4°F | Resp 16 | Ht 61.0 in | Wt 219.4 lb

## 2022-05-15 DIAGNOSIS — E1169 Type 2 diabetes mellitus with other specified complication: Secondary | ICD-10-CM | POA: Diagnosis not present

## 2022-05-15 DIAGNOSIS — E785 Hyperlipidemia, unspecified: Secondary | ICD-10-CM

## 2022-05-15 DIAGNOSIS — J069 Acute upper respiratory infection, unspecified: Secondary | ICD-10-CM

## 2022-05-15 DIAGNOSIS — J4541 Moderate persistent asthma with (acute) exacerbation: Secondary | ICD-10-CM

## 2022-05-15 LAB — POCT INFLUENZA A/B
Influenza A, POC: NEGATIVE
Influenza B, POC: NEGATIVE

## 2022-05-15 MED ORDER — METFORMIN HCL ER 750 MG PO TB24: ORAL_TABLET | ORAL | 1 refills | Status: DC

## 2022-05-15 MED ORDER — BENZONATATE 100 MG PO CAPS: 200.0000 mg | ORAL_CAPSULE | Freq: Three times a day (TID) | ORAL | 0 refills | Status: DC | PRN

## 2022-05-15 MED ORDER — TRELEGY ELLIPTA 100-62.5-25 MCG/ACT IN AEPB: 1.0000 | INHALATION_SPRAY | Freq: Every day | RESPIRATORY_TRACT | 0 refills | Status: DC

## 2022-05-15 MED ORDER — PREDNISONE 10 MG (21) PO TBPK: ORAL_TABLET | ORAL | 0 refills | Status: DC

## 2022-05-15 NOTE — Assessment & Plan Note (Signed)
Patient is wheezing in the office. She does have a URI.  Discussed with patient holding flovent and taking trelegy while sick. Sample provided from office.  Will also provide oral steroid taper.

## 2022-05-15 NOTE — Assessment & Plan Note (Signed)
Patient reports that she is out of her metformin , refill sent.

## 2022-05-15 NOTE — Progress Notes (Signed)
BP 130/76   Pulse 98   Temp 98.4 F (36.9 C) (Oral)   Resp 16   Ht '5\' 1"'$  (1.549 m)   Wt 219 lb 6.4 oz (99.5 kg)   SpO2 98%   BMI 41.46 kg/m    Subjective:    Patient ID: Beverly Barrett, female    DOB: [birth year], 61 y.o.   MRN: [id number]  HPI: Beverly Barrett is a 61 y.o. female  Chief Complaint  Patient presents with   Cough    Congestion for 3 days, Covid test negative   URI: She reports symptoms started three days ago. She says she has a cough, sore throat,and nasal congestion.  She did a home covid test which was negative. She says she has been taking OTC cough medication.  Patient denies any fever or significant shortness of breath.  Patient reports that her breathing has been okay she had is audibly wheezing.  Discussed with patient getting COVID PCR and rapid flu testing done.  Rapid flu was negative.  Awaiting COVID results.  Did discuss Paxlovid and side effects.  Patient would like to do antiviral treatment if positive.  Encourage patient to take Mucinex and Zyrtec or Claritin.  Also discussed with patient to continue using Flonase.  Send in steroid taper and Tessalon Perles.  Asthma:  She says that she has been using her inhalers. She has been using her flovent and albuterol. She is wheezing. Patient is wheezing in the office. She does have a URI.  Discussed with patient holding flovent and taking trelegy while sick. Sample provided from office.  Will also provide oral steroid taper.    DM:  She says her blood sugar has been running around 110.  No hypoglycemic episode.  States she is out of her metformin.  Refill sent.  Patient advised that oral steroids will increase her blood sugars.  Relevant past medical, surgical, family and social history reviewed and updated as indicated. Interim medical history since our last visit reviewed. Allergies and medications reviewed and updated.  Review of Systems  Constitutional: Negative for fever or weight change.  HEENT:  Positive for nasal congestion Respiratory: Positive for cough and shortness of breath.   Cardiovascular: Negative for chest pain or palpitations.  Gastrointestinal: Negative for abdominal pain, no bowel changes.  Musculoskeletal: Negative for gait problem or joint swelling.  Skin: Negative for rash.  Neurological: Negative for dizziness or headache.  No other specific complaints in a complete review of systems (except as listed in HPI above).      Objective:    BP 130/76   Pulse 98   Temp 98.4 F (36.9 C) (Oral)   Resp 16   Ht '5\' 1"'$  (1.549 m)   Wt 219 lb 6.4 oz (99.5 kg)   SpO2 98%   BMI 41.46 kg/m   Wt Readings from Last 3 Encounters:  05/15/22 219 lb 6.4 oz (99.5 kg)  05/06/22 221 lb 8 oz (100.5 kg)  04/03/22 222 lb (100.7 kg)    Physical Exam  Constitutional: Patient appears well-developed and well-nourished. Obese  No distress.  HEENT: head atraumatic, normocephalic, pupils equal and reactive to light, ears TMs clear, neck supple, throat within normal limits Cardiovascular: Normal rate, regular rhythm and normal heart sounds.  No murmur heard. No BLE edema. Pulmonary/Chest: Effort normal and breath sounds wheezing No respiratory distress. Abdominal: Soft.  There is no tenderness. Psychiatric: Patient has a normal mood and affect. behavior is normal. Judgment and thought content normal.  Results for orders placed or performed in visit on 05/15/22  POCT Influenza A/B  Result Value Ref Range   Influenza A, POC Negative Negative   Influenza B, POC Negative Negative      Assessment & Plan:   Problem List Items Addressed This Visit       Respiratory   Asthma, well controlled    Patient is wheezing in the office. She does have a URI.  Discussed with patient holding flovent and taking trelegy while sick. Sample provided from office.  Will also provide oral steroid taper.        Relevant Medications   predniSONE (STERAPRED UNI-PAK 21 TAB) 10 MG (21) TBPK tablet    Fluticasone-Umeclidin-Vilant (TRELEGY ELLIPTA) 100-62.5-25 MCG/ACT AEPB     Endocrine   Dyslipidemia associated with type 2 diabetes mellitus ([place])    Patient reports that she is out of her metformin , refill sent.       Relevant Medications   metFORMIN (GLUCOPHAGE-XR) 750 MG 24 hr tablet   Other Visit Diagnoses     Viral upper respiratory tract infection    -  Primary   Get COVID PCR, flu negative.  We will send in Paxlovid if positive.  Start taking Mucinex, Zyrtec or Claritin and Flonase.  Push fluids get rest   Relevant Medications   predniSONE (STERAPRED UNI-PAK 21 TAB) 10 MG (21) TBPK tablet   benzonatate (TESSALON) 100 MG capsule   Other Relevant Orders   Novel Coronavirus, NAA ([facility])   POCT Influenza A/B (Completed)        Follow up plan: Return if symptoms worsen or fail to improve.

## 2022-05-16 LAB — SPECIMEN STATUS REPORT

## 2022-05-16 LAB — NOVEL CORONAVIRUS, NAA: SARS-CoV-2, NAA: NOT DETECTED

## 2022-05-27 ENCOUNTER — Telehealth: Payer: Self-pay | Admitting: Neurology

## 2022-05-27 NOTE — Telephone Encounter (Signed)
LVM and sent mychart msg informing pt of r/s needed for 12/26 appt- Beverly Barrett out.

## 2022-06-03 ENCOUNTER — Ambulatory Visit: Payer: Commercial Managed Care - HMO

## 2022-06-03 VITALS — BP 126/80

## 2022-06-03 DIAGNOSIS — E1159 Type 2 diabetes mellitus with other circulatory complications: Secondary | ICD-10-CM

## 2022-06-04 NOTE — Telephone Encounter (Signed)
Called pt to ask if she was needing any refills on BP meds but prescriptions that Dr.Sowles sent in have a 6 month supply.

## 2022-06-04 NOTE — Telephone Encounter (Signed)
Copied from [place] [id number]. Topic: General - Call Back - No Documentation >> Jun 04, 2022  9:54 AM Everette C wrote: Reason for CRM: The patient has returned a missed call from the practice  Please contact further when possible

## 2022-06-18 ENCOUNTER — Other Ambulatory Visit: Payer: Self-pay | Admitting: Gastroenterology

## 2022-06-18 DIAGNOSIS — K5904 Chronic idiopathic constipation: Secondary | ICD-10-CM

## 2022-06-22 ENCOUNTER — Other Ambulatory Visit: Payer: Self-pay | Admitting: Gastroenterology

## 2022-07-11 ENCOUNTER — Other Ambulatory Visit: Payer: Self-pay | Admitting: Family Medicine

## 2022-07-11 DIAGNOSIS — E034 Atrophy of thyroid (acquired): Secondary | ICD-10-CM

## 2022-07-23 ENCOUNTER — Ambulatory Visit: Payer: Commercial Managed Care - HMO | Admitting: Neurology

## 2022-08-02 NOTE — Progress Notes (Unsigned)
Name: Beverly Barrett   MRN: [id number]    DOB: May 16, 1961   Date:08/05/2022       Progress Note  Subjective  Chief Complaint  Follow Up  HPI  Diabetes type 2 with dyslipidemia in obese: she has a long history of metabolic syndrome. A1C has been at goal, last level done 01/23 and it was 6.8 % , she is under the care of Endo - Dr. Honor Junes.   She is on Metformin and also Actos and doing well, no side effects   She denies polyphagia, but has episodes of polydipsia and polyuria. .  She has some feet pain/ tingling /neuropathy , on gabapentin and symptoms are controlled . She also takes statin and ARB as prescribed . Last LDL was at goal down to 63.   Chronic constipation: currently taking linzess 145 daily and bowel movements are controlled, she has bowel movements daily and is Bristol scale of 4 occasionally a 6. She still has nausea and occasional regurgitation    Major Depression/GAD: She is seeing Dr. Shea Evans. She is off alprazolam, she has been off hdyroxizine , looks like she is only taking Mirtazapine and states she is feeling all right. Stable    HTN: She is on cBenicar 40/25 , norvasc 2.5 mg and metoprolol, plus clonidine at night, since she stopped taking clonidine in the mornings, still has some orthostatic changes in the mornings, but feeling better now    Angina: had evaluation by cardiologist and Echo myoview . She  denies decrease in exercise tolerance, she has some fluttering at night and intermittent chest pain. . She has not taken NTG lately. She has some sob with moderate activity. She is still taking Crestor and last LDL was at goal    OSA: using CPAP all night about  7 hours per night.    Morbid obesity: BMI over 40, up only a couple of pounds since last visit, she is trying to follow a diabetic diet   GERD:Seen by Dr. Purnell Shoemaker , she is on Omeprazole 40 mg daily , she continues to have regurgitation with meals and sometimes with water, we will add metoclopramide, discussed  possible side effects    Migraine: seen at the headache center by [facility]  currently on  gabapentin and Maxalt recently, off topamax, she did not get approved for botox. She states symptoms, rx of Nurtec on her list but she is not sure if taking it    Hypothyroidism she is currently taking medication daily, she has chronic dry skin and chronic constipation controlled with linzess now  , last TSH was suppressed, she is taking 112 mcg daily now , advised to contact Dr. Honor Junes to adjust dose    DDD lumbar spine: had MRI done, ordered by Ortho and was found to have DDD lumbar spine, she was seen by Dr. Phyllis Ginger and also seeing Dr. Arvella Nigh ( Ortho ) , pain level is 7/10, also has shoulder pain and had a steroid injection recently pain is 10/10 , she is due for follow up with Ortho   IMPRESSION: 04/2022  1. Moderate tendinosis of the supraspinatus tendon with a partial-thickness articular surface tear. 2. Mild tendinosis of the infraspinatus tendon. 3. Severe tendinosis of the intra-articular portion of the long head of the biceps tendon. 4. Mild subacromial/subdeltoid bursitis.   Asthma Moderate persistent : she states there is mold in her house , she has nocturnal cough and SOB with activity, she is not sure if currently taking Flovent, advised to check  with pharmacy     Patient Active Problem List   Diagnosis Date Noted   MDD (major depressive disorder), recurrent, in full remission ([place]) 12/26/2021   Polyp of colon    Chronic constipation 12/03/2021   Asthma, well controlled 12/03/2021   Migraine without aura and without status migrainosus, not intractable 12/03/2021   Memory loss 08/27/2021   MDD (major depressive disorder), recurrent episode, mild ([place]) 07/19/2021   Sleep disorder 07/19/2021   Dyslipidemia associated with type 2 diabetes mellitus ([place]) 03/06/2020   MDD (major depressive disorder), recurrent episode, moderate ([place]) 04/07/2019   Chronic low back pain 12/31/2018   Grade  II internal hemorrhoids 05/16/2017   Mixed stress and urge urinary incontinence 11/12/2016   Degenerative arthritis of lumbar spine 11/12/2016   Common migraine with intractable migraine 09/20/2016   Iron deficiency anemia 09/12/2016   GAD (generalized anxiety disorder) 12/20/2015   OSA on CPAP 12/20/2015   Benign neoplasm of sigmoid colon    Chronic pain of multiple joints 06/14/2014   Arthritis, degenerative 06/14/2014   Morbid obesity with BMI of 40.0-44.9, adult ([place]) 06/14/2014   Hypothyroidism due to acquired atrophy of thyroid 08/18/2013   Hypertension goal BP (blood pressure) < 140/90 12/07/2010   Familial multiple lipoprotein-type hyperlipidemia 12/07/2010   Allergic rhinitis 10/13/2008    Past Surgical History:  Procedure Laterality Date   ABDOMINAL HYSTERECTOMY     bladder tach  1995   CESAREAN SECTION     COLONOSCOPY WITH PROPOFOL N/A 05/29/2015   Procedure: COLONOSCOPY WITH PROPOFOL;  Surgeon: Lucilla Lame, MD;  Location: [place];  Service: Endoscopy;  Laterality: N/A;  Latex allergy sleep apnea - no CPAP machine (yet)   COLONOSCOPY WITH PROPOFOL N/A 12/18/2021   Procedure: COLONOSCOPY WITH PROPOFOL;  Surgeon: Lin Landsman, MD;  Location[facility];  Service: Gastroenterology;  Laterality: N/A;   ESOPHAGOGASTRODUODENOSCOPY N/A 04/23/2017   Procedure: ESOPHAGOGASTRODUODENOSCOPY (EGD);  Surgeon: Lin Landsman, MD;  Location: [place];  Service: Gastroenterology;  Laterality: N/A;  Latex allergy sleep apnea   POLYPECTOMY  05/29/2015   Procedure: POLYPECTOMY;  Surgeon: Lucilla Lame, MD;  Location: [place];  Service: Endoscopy;;    Family History  Problem Relation Age of Onset   Diabetes Sister    Anxiety disorder Sister    Depression Sister    Hypertension Sister    Cirrhosis Mother    Diabetes Mother    Cirrhosis Father    Breast cancer Sister 29   Heart attack Brother    Alcohol abuse Brother    Drug abuse  Brother    Prostate cancer Neg Hx    Kidney cancer Neg Hx    Bladder Cancer Neg Hx     Social History   Tobacco Use   Smoking status: Never   Smokeless tobacco: Never  Substance Use Topics   Alcohol use: No    Alcohol/week: 0.0 standard drinks of alcohol     Current Outpatient Medications:    acetaminophen (TYLENOL) 500 MG tablet, Take 2 tablets (1,000 mg total) by mouth every 6 (six) hours as needed., Disp: 30 tablet, Rfl: 0   albuterol (VENTOLIN HFA) 108 (90 Base) MCG/ACT inhaler, INHALE 2 PUFFS BY MOUTH EVERY 6 HOURS AS NEEDED FOR WHEEZING OR SHORTNESS OF BREATH, Disp: 2 each, Rfl: 0   amLODipine (NORVASC) 2.5 MG tablet, Take 1 tablet (2.5 mg total) by mouth daily., Disp: 90 tablet, Rfl: 1   aspirin 81 MG EC tablet, Take 81 mg by mouth  daily., Disp: , Rfl:    cloNIDine (CATAPRES) 0.1 MG tablet, Take 1 tablet (0.1 mg total) by mouth every evening., Disp: 90 tablet, Rfl: 1   estradiol (ESTRACE VAGINAL) 0.1 MG/GM vaginal cream, Apply 0.'5mg'$  (pea-sized amount)  just inside the vaginal introitus with a finger-tip on Monday, Wednesday and Friday nights., Disp: 30 g, Rfl: 12   fluticasone (FLONASE) 50 MCG/ACT nasal spray, Place 2 sprays into both nostrils daily as needed for allergies., Disp: 48 mL, Rfl: 0   fluticasone (FLOVENT HFA) 110 MCG/ACT inhaler, Inhale 2 puffs into the lungs 2 (two) times daily., Disp: 36 g, Rfl: 1   gabapentin (NEURONTIN) 400 MG capsule, 1 capsule in the morning and at midday, 2 in the evening, Disp: 360 capsule, Rfl: 1   glucose blood test strip, Use as instructed, Disp: 100 each, Rfl: 12   ketoconazole (NIZORAL) 2 % cream, Apply 1 application. topically daily., Disp: 60 g, Rfl: 0   Lancet Devices (ONE TOUCH DELICA LANCING DEV) MISC, 1 each by Does not apply route daily., Disp: 1 each, Rfl: 0   levothyroxine (SYNTHROID) 112 MCG tablet, Take 1 tablet by mouth once daily, Disp: 30 tablet, Rfl: 0   linaclotide (LINZESS) 145 MCG CAPS capsule, Take 1 capsule by mouth  once daily, Disp: 90 capsule, Rfl: 1   metFORMIN (GLUCOPHAGE-XR) 750 MG 24 hr tablet, TAKE 2 TABLETS BY MOUTH ONCE A DAY WITH BREAKFAST, Disp: 180 tablet, Rfl: 1   metoCLOPramide (REGLAN) 5 MG tablet, Take 1 tablet (5 mg total) by mouth daily before supper., Disp: 90 tablet, Rfl: 1   metoprolol succinate (TOPROL-XL) 50 MG 24 hr tablet, TAKE 1 TABLET BY MOUTH ONCE A DAY WITH OR IMMEDIATELY FOLLOWING A MEAL., Disp: 90 tablet, Rfl: 1   mirtazapine (REMERON) 7.5 MG tablet, Take 1 tablet (7.5 mg total) by mouth at bedtime., Disp: 90 tablet, Rfl: 1   montelukast (SINGULAIR) 10 MG tablet, Take 1 tablet (10 mg total) by mouth at bedtime., Disp: 90 tablet, Rfl: 1   olmesartan-hydrochlorothiazide (BENICAR HCT) 40-25 MG tablet, Take 1 tablet by mouth daily., Disp: 90 tablet, Rfl: 1   OneTouch Delica Lancets 73S MISC, 1 each by Does not apply route daily., Disp: 100 each, Rfl: 2   pioglitazone (ACTOS) 15 MG tablet, Take 1 tablet (15 mg total) by mouth daily., Disp: 90 tablet, Rfl: 1   Rimegepant Sulfate (NURTEC) 75 MG TBDP, Take 75 mg by mouth every other day., Disp: 16 tablet, Rfl: 11   rizatriptan (MAXALT) 10 MG tablet, May repeat in 2 hours if needed, Disp: 10 tablet, Rfl: 3   rosuvastatin (CRESTOR) 40 MG tablet, Take 1 tablet (40 mg total) by mouth daily. In place of Atorvastatin, Disp: 90 tablet, Rfl: 3   senna (SENOKOT) 8.6 MG TABS tablet, Take 1 tablet by mouth daily., Disp: , Rfl:    nitroGLYCERIN (NITROSTAT) 0.4 MG SL tablet, Place 1 tablet (0.4 mg total) under the tongue every 5 (five) minutes as needed. (Patient not taking: Reported on 08/05/2022), Disp: 25 tablet, Rfl: 0   omeprazole (PRILOSEC) 40 MG capsule, Take 1 capsule (40 mg total) by mouth daily., Disp: 90 capsule, Rfl: 1  Allergies  Allergen Reactions   Latex Swelling    Pt unsure of reaction pt states something happened while in the hospital after surgery.    I personally reviewed active problem list, medication list, allergies, family  history, social history, health maintenance with the patient/caregiver today.   ROS  Ten systems reviewed and  is negative except as mentioned in HPI   Objective  Vitals:   08/05/22 1031  BP: 126/72  Pulse: 94  Resp: 16  SpO2: 97%  Weight: 222 lb (100.7 kg)  Height: '5\' 1"'$  (1.549 m)    Body mass index is 41.95 kg/m.  Physical Exam  Constitutional: Patient appears well-developed and well-nourished. Obese  No distress.  HEENT: head atraumatic, normocephalic, pupils equal and reactive to light,, neck supple Cardiovascular: Normal rate, regular rhythm and normal heart sounds.  No murmur heard. No BLE edema. Pulmonary/Chest: Effort normal and breath sounds normal. No respiratory distress. Abdominal: Soft.  There is no tenderness. Psychiatric: Patient has a normal mood and affect. behavior is normal. Judgment and thought content normal.   Recent Results (from the past 2160 hour(s))  Novel Coronavirus, NAA ([facility])     Status: None   Collection Time: 05/15/22 12:00 AM   Specimen: Nasopharyngeal(NP) swabs in vial transport medium   Nasopharynge  Previous  Result Value Ref Range   SARS-CoV-2, NAA Not Detected Not Detected    Comment: This nucleic acid amplification test was developed and its performance characteristics determined by [employer]. Nucleic acid amplification tests include RT-PCR and TMA. This test has not been FDA cleared or approved. This test has been authorized by FDA under an Emergency Use Authorization (EUA). This test is only authorized for the duration of time the declaration that circumstances exist justifying the authorization of the emergency use of in vitro diagnostic tests for detection of SARS-CoV-2 virus and/or diagnosis of COVID-19 infection under section 564(b)(1) of the Act, 21 U.S.C. 403JQD-6(K) (1), unless the authorization is terminated or revoked sooner. When diagnostic testing is negative, the possibility of a false negative result  should be considered in the context of a patient's recent exposures and the presence of clinical signs and symptoms consistent with COVID-19. An individual without symptoms of COVID-19 and who is not shedding SARS-CoV-2 virus wo uld expect to have a negative (not detected) result in this assay.   Specimen status report     Status: None   Collection Time: 05/15/22 12:00 AM  Result Value Ref Range   specimen status report Comment     Comment: Please note Please note The date and/or time of collection was not indicated on the requisition as required by state and federal law.  The date of receipt of the specimen was used as the collection date if not supplied.   POCT Influenza A/B     Status: None   Collection Time: 05/15/22 12:00 PM  Result Value Ref Range   Influenza A, POC Negative Negative   Influenza B, POC Negative Negative  POCT HgB A1C     Status: Abnormal   Collection Time: 08/05/22 10:33 AM  Result Value Ref Range   Hemoglobin A1C 6.2 (A) 4.0 - 5.6 %   HbA1c POC (<> result, manual entry)     HbA1c, POC (prediabetic range)     HbA1c, POC (controlled diabetic range)      PHQ2/9:    08/05/2022   10:32 AM 05/15/2022   11:45 AM 04/08/2022    1:05 PM 04/03/2022   10:12 AM 12/26/2021    1:10 PM  Depression screen PHQ 2/9  Decreased Interest 1 0  2   Down, Depressed, Hopeless 2 0  2   PHQ - 2 Score 3 0  4   Altered sleeping 0   2   Tired, decreased energy 0   2   Change in  appetite 0   0   Feeling bad or failure about yourself  2   1   Trouble concentrating 0   0   Moving slowly or fidgety/restless 0   0   Suicidal thoughts 0   0   PHQ-9 Score 5   9   Difficult doing work/chores          Information is confidential and restricted. Go to Review Flowsheets to unlock data.    phq 9 is positive   Fall Risk:    08/05/2022   10:32 AM 05/15/2022   11:45 AM 04/03/2022   10:12 AM 12/03/2021   12:56 PM 10/03/2021    9:38 AM  Fall Risk   Falls in the past year? 0 0 0 0 1   Number falls in past yr: 0 0 0  0  Injury with Fall? 0 0 0  0  Risk for fall due to : Impaired balance/gait  Impaired balance/gait No Fall Risks Impaired balance/gait  Follow up Falls prevention discussed Falls evaluation completed Falls prevention discussed Falls prevention discussed Falls prevention discussed      Functional Status Survey: Is the patient deaf or have difficulty hearing?: No Does the patient have difficulty seeing, even when wearing glasses/contacts?: No Does the patient have difficulty concentrating, remembering, or making decisions?: No Does the patient have difficulty walking or climbing stairs?: Yes Does the patient have difficulty dressing or bathing?: Yes Does the patient have difficulty doing errands alone such as visiting a doctor's office or shopping?: Yes    Assessment & Plan  1. Hypertension associated with type 2 diabetes mellitus ([facility])  - POCT HgB A1C  2. Regurgitation of food  - metoCLOPramide (REGLAN) 5 MG tablet; Take 1 tablet (5 mg total) by mouth daily before supper.  Dispense: 90 tablet; Refill: 1  3. Morbid obesity ([place])  Discussed with the patient the risk posed by an increased BMI. Discussed importance of portion control, calorie counting and at least 150 minutes of physical activity weekly. Avoid sweet beverages and drink more water. Eat at least 6 servings of fruit and vegetables daily    4. Dyslipidemia associated with type 2 diabetes mellitus ([place])  Reviewed healthy diet   5. Moderate persistent asthma without complication  Doing well at this time   6. OSA on CPAP  Compliant   7. Vitamin D deficiency  Take supplementation  8. Migraine without aura and without status migrainosus, not intractable  She is not sure if she is taking Nurtec, reminded her to bring all her medications to office visits  9. Gastroesophageal reflux disease without esophagitis  - omeprazole (PRILOSEC) 40 MG capsule; Take 1 capsule (40 mg total)  by mouth daily.  Dispense: 90 capsule; Refill: 1 - metoCLOPramide (REGLAN) 5 MG tablet; Take 1 tablet (5 mg total) by mouth daily before supper.  Dispense: 90 tablet; Refill: 1  10. Viral upper respiratory tract infection

## 2022-08-05 ENCOUNTER — Ambulatory Visit (INDEPENDENT_AMBULATORY_CARE_PROVIDER_SITE_OTHER): Payer: Commercial Managed Care - HMO | Admitting: Family Medicine

## 2022-08-05 ENCOUNTER — Encounter: Payer: Self-pay | Admitting: Family Medicine

## 2022-08-05 VITALS — BP 126/72 | HR 94 | Resp 16 | Ht 61.0 in | Wt 222.0 lb

## 2022-08-05 DIAGNOSIS — E559 Vitamin D deficiency, unspecified: Secondary | ICD-10-CM

## 2022-08-05 DIAGNOSIS — Z6841 Body Mass Index (BMI) 40.0 and over, adult: Secondary | ICD-10-CM

## 2022-08-05 DIAGNOSIS — G43009 Migraine without aura, not intractable, without status migrainosus: Secondary | ICD-10-CM

## 2022-08-05 DIAGNOSIS — E1159 Type 2 diabetes mellitus with other circulatory complications: Secondary | ICD-10-CM | POA: Diagnosis not present

## 2022-08-05 DIAGNOSIS — E1169 Type 2 diabetes mellitus with other specified complication: Secondary | ICD-10-CM | POA: Diagnosis not present

## 2022-08-05 DIAGNOSIS — I152 Hypertension secondary to endocrine disorders: Secondary | ICD-10-CM

## 2022-08-05 DIAGNOSIS — J069 Acute upper respiratory infection, unspecified: Secondary | ICD-10-CM

## 2022-08-05 DIAGNOSIS — Z7984 Long term (current) use of oral hypoglycemic drugs: Secondary | ICD-10-CM

## 2022-08-05 DIAGNOSIS — E785 Hyperlipidemia, unspecified: Secondary | ICD-10-CM

## 2022-08-05 DIAGNOSIS — K219 Gastro-esophageal reflux disease without esophagitis: Secondary | ICD-10-CM

## 2022-08-05 DIAGNOSIS — J4540 Moderate persistent asthma, uncomplicated: Secondary | ICD-10-CM

## 2022-08-05 DIAGNOSIS — G4733 Obstructive sleep apnea (adult) (pediatric): Secondary | ICD-10-CM

## 2022-08-05 DIAGNOSIS — R1110 Vomiting, unspecified: Secondary | ICD-10-CM

## 2022-08-05 LAB — POCT GLYCOSYLATED HEMOGLOBIN (HGB A1C): Hemoglobin A1C: 6.2 % — AB (ref 4.0–5.6)

## 2022-08-05 MED ORDER — METOCLOPRAMIDE HCL 5 MG PO TABS: 5.0000 mg | ORAL_TABLET | Freq: Every day | ORAL | 1 refills | Status: AC

## 2022-08-05 MED ORDER — OMEPRAZOLE 40 MG PO CPDR: 40.0000 mg | DELAYED_RELEASE_CAPSULE | Freq: Every day | ORAL | 1 refills | Status: DC

## 2022-08-05 NOTE — Patient Instructions (Addendum)
Please call Dr. Honor Junes to find out if you need to adjust thyroid medication and also for a glucose meter - explain you only got the strips   Go to local pharmacy for Tdap, RSV and shingles vaccines

## 2022-08-06 ENCOUNTER — Ambulatory Visit (INDEPENDENT_AMBULATORY_CARE_PROVIDER_SITE_OTHER): Payer: Commercial Managed Care - HMO | Admitting: Psychiatry

## 2022-08-06 ENCOUNTER — Encounter: Payer: Self-pay | Admitting: Psychiatry

## 2022-08-06 VITALS — BP 142/80 | HR 91 | Temp 98.1°F | Ht 61.0 in | Wt 224.2 lb

## 2022-08-06 DIAGNOSIS — F3342 Major depressive disorder, recurrent, in full remission: Secondary | ICD-10-CM | POA: Diagnosis not present

## 2022-08-06 DIAGNOSIS — G4701 Insomnia due to medical condition: Secondary | ICD-10-CM | POA: Diagnosis not present

## 2022-08-06 DIAGNOSIS — R413 Other amnesia: Secondary | ICD-10-CM

## 2022-08-06 DIAGNOSIS — F411 Generalized anxiety disorder: Secondary | ICD-10-CM | POA: Diagnosis not present

## 2022-08-06 NOTE — Progress Notes (Signed)
[facility] OP Progress Note  08/06/2022 2:29 PM Beverly Barrett  MRN:  [id number]  Chief Complaint:  Chief Complaint  Patient presents with   Follow-up   Medication Refill   Anxiety   Depression   HPI: Beverly Barrett is a 62 year old African-American female, married, lives in [place], has a history of MDD, GAD, chronic pain, arthritis, history of OSA on CPAP, hypothyroidism, migraine headaches was evaluated in office today.  Patient today reports she currently lives in a new trailer which her daughter got her.  Patient reports her old trailer was broken and leaky.  She reports she her husband and her daughter and grandchild stay together now.  She reports she is happy she gets to see her granddaughter every day.  She enjoys it.  Patient however reports her niece passed away recently from health problems.  She is currently grieving although coping okay.  Patient continues to have short-term memory problems like misplacing certain things at home however overall she has been doing okay.  3 word memory immediately out of 3, after 5 minutes 2 out of 3.  Was able to do serial threes well, attention and focus seem to be good.  Patient is currently compliant on the mirtazapine, denies side effects.  Reports sleep is overall good.  Denies any suicidality, homicidality or perceptual disturbances.  Patient denies any other concerns today.    Visit Diagnosis:    ICD-10-CM   1. MDD (major depressive disorder), recurrent, in full remission ([place])  F33.42     2. GAD (generalized anxiety disorder)  F41.1     3. Insomnia due to medical condition  G47.01    mood, OSA on CPAP    4. Memory loss  R41.3       Past Psychiatric History: Reviewed past psychiatric history from progress note on 01/21/2018.  Past Medical History:  Past Medical History:  Diagnosis Date   Anginal pain ([place])    none in approx 2 yrs   Anxiety    Asthma    Chronic lower back pain    Common migraine with  intractable migraine 09/20/2016   Depression    suicidal ideation   Diabetes mellitus without complication (HCC)    GERD (gastroesophageal reflux disease)    Headache    migraines -3x/wk   Hyperlipidemia    Hypertension    Hypothyroidism    Motion sickness    cars   Multilevel degenerative disc disease    Shortness of breath dyspnea    Sleep apnea    CPAP   Vertigo    Wears dentures    partial upper    Past Surgical History:  Procedure Laterality Date   ABDOMINAL HYSTERECTOMY     bladder tach  1995   CESAREAN SECTION     COLONOSCOPY WITH PROPOFOL N/A 05/29/2015   Procedure: COLONOSCOPY WITH PROPOFOL;  Surgeon: Lucilla Lame, MD;  Location: [facility];  Service: Endoscopy;  Laterality: N/A;  Latex allergy sleep apnea - no CPAP machine (yet)   COLONOSCOPY WITH PROPOFOL N/A 12/18/2021   Procedure: COLONOSCOPY WITH PROPOFOL;  Surgeon: Lin Landsman, MD;  Location: [facility]COPY;  Service: Gastroenterology;  Laterality: N/A;   ESOPHAGOGASTRODUODENOSCOPY N/A 04/23/2017   Procedure: ESOPHAGOGASTRODUODENOSCOPY (EGD);  Surgeon: Lin Landsman, MD;  Location: [facility];  Service: Gastroenterology;  Laterality: N/A;  Latex allergy sleep apnea   POLYPECTOMY  05/29/2015   Procedure: POLYPECTOMY;  Surgeon: Lucilla Lame, MD;  Location: [facility];  Service: Endoscopy;;  Family Psychiatric History: Reviewed family psychiatric history from progress note on 01/21/2018.  Family History:  Family History  Problem Relation Age of Onset   Diabetes Sister    Anxiety disorder Sister    Depression Sister    Hypertension Sister    Cirrhosis Mother    Diabetes Mother    Cirrhosis Father    Breast cancer Sister 81   Heart attack Brother    Alcohol abuse Brother    Drug abuse Brother    Prostate cancer Neg Hx    Kidney cancer Neg Hx    Bladder Cancer Neg Hx     Social History: Reviewed social history from progress note on 01/21/2018. Social History    Socioeconomic History   Marital status: Married    Spouse name: IT sales professional   Number of children: 3   Years of education: Not on file   Highest education level: GED or equivalent  Occupational History   Not on file  Tobacco Use   Smoking status: Never   Smokeless tobacco: Never  Vaping Use   Vaping Use: Never used  Substance and Sexual Activity   Alcohol use: No    Alcohol/week: 0.0 standard drinks of alcohol   Drug use: No   Sexual activity: Not Currently    Partners: Male  Other Topics Concern   Not on file  Social History Narrative   Lives with husband and one daughter other kids are out of the house   She does not work, husband on disability    Social Determinants of Health   Financial Resource Strain: High Risk (03/06/2020)   Overall Financial Resource Strain (CARDIA)    Difficulty of Paying Living Expenses: Very hard  Food Insecurity: Food Insecurity Present (07/14/2019)   Hunger Vital Sign    Worried About [place] in the Last Year: Sometimes true    [place] in the Last Year: Sometimes true  Transportation Needs: No Transportation Needs (07/14/2019)   PRAPARE - Hydrologist (Medical): No    Lack of Transportation (Non-Medical): No  Physical Activity: Insufficiently Active (07/14/2019)   Exercise Vital Sign    Days of Exercise per Week: 6 days    Minutes of Exercise per Session: 10 min  Stress: Stress Concern Present (07/14/2019)   [place]    Feeling of Stress : To some extent  Social Connections: Moderately Integrated (07/14/2019)   Social Connection and Isolation Panel [NHANES]    Frequency of Communication with Friends and Family: More than three times a week    Frequency of Social Gatherings with Friends and Family: More than three times a week    Attends Religious Services: More than 4 times per year    Active Member of [employer] or  Organizations: No    Attends Archivist Meetings: Never    Marital Status: Married    Allergies:  Allergies  Allergen Reactions   Latex Swelling    Pt unsure of reaction pt states something happened while in the hospital after surgery.    Metabolic Disorder Labs: Lab Results  Component Value Date   HGBA1C 6.2 (A) 08/05/2022   MPG 148 08/06/2021   MPG 162.81 08/28/2019   No results found for: "PROLACTIN" Lab Results  Component Value Date   CHOL 138 04/03/2022   TRIG 98 04/03/2022   HDL 57 04/03/2022   CHOLHDL 2.4 04/03/2022   VLDL 44 (H)  02/12/2017   [place] 63 04/03/2022   LDLCALC 88 03/12/2021   Lab Results  Component Value Date   TSH 0.48 03/04/2022   TSH 0.01 (L) 12/03/2021    Therapeutic Level Labs: No results found for: "LITHIUM" No results found for: "VALPROATE" No results found for: "CBMZ"  Current Medications: Current Outpatient Medications  Medication Sig Dispense Refill   acetaminophen (TYLENOL) 500 MG tablet Take 2 tablets (1,000 mg total) by mouth every 6 (six) hours as needed. 30 tablet 0   albuterol (VENTOLIN HFA) 108 (90 Base) MCG/ACT inhaler INHALE 2 PUFFS BY MOUTH EVERY 6 HOURS AS NEEDED FOR WHEEZING OR SHORTNESS OF BREATH 2 each 0   amLODipine (NORVASC) 2.5 MG tablet Take 1 tablet (2.5 mg total) by mouth daily. 90 tablet 1   aspirin 81 MG EC tablet Take 81 mg by mouth daily.     cloNIDine (CATAPRES) 0.1 MG tablet Take 1 tablet (0.1 mg total) by mouth every evening. 90 tablet 1   estradiol (ESTRACE VAGINAL) 0.1 MG/GM vaginal cream Apply 0.'5mg'$  (pea-sized amount)  just inside the vaginal introitus with a finger-tip on Monday, Wednesday and Friday nights. 30 g 12   fluticasone (FLONASE) 50 MCG/ACT nasal spray Place 2 sprays into both nostrils daily as needed for allergies. 48 mL 0   fluticasone (FLOVENT HFA) 110 MCG/ACT inhaler Inhale 2 puffs into the lungs 2 (two) times daily. 36 g 1   gabapentin (NEURONTIN) 400 MG capsule 1 capsule in the  morning and at midday, 2 in the evening 360 capsule 1   glucose blood test strip Use as instructed 100 each 12   ketoconazole (NIZORAL) 2 % cream Apply 1 application. topically daily. 60 g 0   Lancet Devices (ONE TOUCH DELICA LANCING DEV) MISC 1 each by Does not apply route daily. 1 each 0   levothyroxine (SYNTHROID) 112 MCG tablet Take 1 tablet by mouth once daily 30 tablet 0   linaclotide (LINZESS) 145 MCG CAPS capsule Take 1 capsule by mouth once daily 90 capsule 1   metFORMIN (GLUCOPHAGE-XR) 750 MG 24 hr tablet TAKE 2 TABLETS BY MOUTH ONCE A DAY WITH BREAKFAST 180 tablet 1   metoCLOPramide (REGLAN) 5 MG tablet Take 1 tablet (5 mg total) by mouth daily before supper. 90 tablet 1   metoprolol succinate (TOPROL-XL) 50 MG 24 hr tablet TAKE 1 TABLET BY MOUTH ONCE A DAY WITH OR IMMEDIATELY FOLLOWING A MEAL. 90 tablet 1   mirtazapine (REMERON) 7.5 MG tablet Take 1 tablet (7.5 mg total) by mouth at bedtime. 90 tablet 1   montelukast (SINGULAIR) 10 MG tablet Take 1 tablet (10 mg total) by mouth at bedtime. 90 tablet 1   nitroGLYCERIN (NITROSTAT) 0.4 MG SL tablet Place 1 tablet (0.4 mg total) under the tongue every 5 (five) minutes as needed. 25 tablet 0   olmesartan-hydrochlorothiazide (BENICAR HCT) 40-25 MG tablet Take 1 tablet by mouth daily. 90 tablet 1   omeprazole (PRILOSEC) 40 MG capsule Take 1 capsule (40 mg total) by mouth daily. 90 capsule 1   OneTouch Delica Lancets 13Y MISC 1 each by Does not apply route daily. 100 each 2   pioglitazone (ACTOS) 15 MG tablet Take 1 tablet (15 mg total) by mouth daily. 90 tablet 1   Rimegepant Sulfate (NURTEC) 75 MG TBDP Take 75 mg by mouth every other day. 16 tablet 11   rizatriptan (MAXALT) 10 MG tablet May repeat in 2 hours if needed 10 tablet 3   rosuvastatin (CRESTOR) 40 MG  tablet Take 1 tablet (40 mg total) by mouth daily. In place of Atorvastatin 90 tablet 3   senna (SENOKOT) 8.6 MG TABS tablet Take 1 tablet by mouth daily.     No current  facility-administered medications for this visit.     Musculoskeletal: Strength & Muscle Tone: within normal limits Gait & Station:  walks with cane Patient leans: N/A  Psychiatric Specialty Exam: Review of Systems  Psychiatric/Behavioral:         Grieving  All other systems reviewed and are negative.   Blood pressure (!) 142/80, pulse 91, temperature 98.1 F (36.7 C), temperature source Oral, height '5\' 1"'$  (1.549 m), weight 224 lb 3.2 oz (101.7 kg).Body mass index is 42.36 kg/m.  General Appearance: Casual  Eye Contact:  Fair  Speech:  Clear and Coherent  Volume:  Normal  Mood:   grieving  Affect:  Appropriate  Thought Process:  Goal Directed and Descriptions of Associations: Intact  Orientation:  Full (Time, Place, and Person)  Thought Content: Logical   Suicidal Thoughts:  No  Homicidal Thoughts:  No  Memory:  Immediate;   Fair Recent;   Fair Remote;   limited  Judgement:  Fair  Insight:  Fair  Psychomotor Activity:  Normal  Concentration:  Concentration: Fair and Attention Span: Fair  Recall:  [employer] of Knowledge: Fair  Language: Fair  Akathisia:  No  Handed:  Right  AIMS (if indicated): not done  Assets:  Communication Skills Desire for Improvement Housing Intimacy Social Support  ADL's:  Intact  Cognition: WNL  Sleep:  Fair   Screenings: [facility] Office Visit from 12/26/2021 in [facility] Office Visit from 10/24/2021 in [facility] Total Score 0 0      GAD-7    [place] Visit from 08/06/2022 in [place] [facility] Office Visit from 04/08/2022 in [facility] Office Visit from 12/26/2021 in [place] Office Visit from 12/03/2021 in [facility] Office Visit from 03/19/2018 in [facility]  Total GAD-7 Score '6 10 3 12 14      '$ PHQ2-9    [place] Visit from 08/06/2022 in [facility] Office Visit from 08/05/2022 in [facility] Office Visit from 05/15/2022 in [facility] Office Visit from 04/08/2022 in [facility] Office Visit from 04/03/2022 in [facility]  PHQ-2 Total Score 2 3 0 3 4  PHQ-9 Total Score 5 5 -- 7 9      [place] Office Visit from 08/06/2022 in [facility] Office Visit from 04/08/2022 in [facility] Office Visit from 12/26/2021 in [facility] No Risk No Risk No Risk        Assessment and Plan: Beverly Barrett is a 62 year old African-American female, married, lives in [place], has a history of MDD, GAD, insomnia, OSA on CPAP, migraine headaches, hypothyroidism was evaluated in office today.  Patient is currently grieving although coping well, currently compliant on medications which are effective.  Plan as noted below.  Plan MDD in remission Mirtazapine 7.5 mg p.o. nightly  GAD-stable Mirtazapine 7.5 mg p.o. nightly Patient also has gabapentin 400 mg in the morning and 800 mg in the evening.  Although prescribed for pain it is a mood stabilizer and helps with anxiety.  Insomnia-stable Continue CPAP for OSA  Mirtazapine 7.5 mg p.o. nightly  Memory loss-stable MMSE-04/08/2022-27 out of 30.  Will consider repeating at next visit.   Patient with elevated blood pressure reading in session today, however per review of notes per Dr. Ancil Boozer dated 08/05/2022-yesterday, blood pressure was 126/72.  Patient hence advised to monitor.  Patient to follow up with primary care provider as needed.  Follow-up in clinic in 4 months or sooner in person.   This note was generated in part or whole with voice recognition software. Voice recognition is usually quite accurate but there are transcription  errors that can and very often do occur. I apologize for any typographical errors that were not detected and corrected.     Ursula Alert, MD 08/06/2022, 2:29 PM

## 2022-08-12 ENCOUNTER — Other Ambulatory Visit: Payer: Self-pay | Admitting: Family Medicine

## 2022-08-12 DIAGNOSIS — E034 Atrophy of thyroid (acquired): Secondary | ICD-10-CM

## 2022-08-14 ENCOUNTER — Other Ambulatory Visit: Payer: Self-pay | Admitting: Family Medicine

## 2022-08-14 DIAGNOSIS — E034 Atrophy of thyroid (acquired): Secondary | ICD-10-CM

## 2022-08-20 ENCOUNTER — Other Ambulatory Visit: Payer: Self-pay | Admitting: Family Medicine

## 2022-08-20 ENCOUNTER — Encounter: Payer: Self-pay | Admitting: Family Medicine

## 2022-08-20 DIAGNOSIS — E034 Atrophy of thyroid (acquired): Secondary | ICD-10-CM

## 2022-08-27 ENCOUNTER — Encounter: Payer: Self-pay | Admitting: Family Medicine

## 2022-10-02 ENCOUNTER — Other Ambulatory Visit: Payer: Self-pay | Admitting: Family Medicine

## 2022-10-02 DIAGNOSIS — R232 Flushing: Secondary | ICD-10-CM

## 2022-10-02 DIAGNOSIS — I152 Hypertension secondary to endocrine disorders: Secondary | ICD-10-CM

## 2022-10-02 DIAGNOSIS — J4540 Moderate persistent asthma, uncomplicated: Secondary | ICD-10-CM

## 2022-10-23 ENCOUNTER — Other Ambulatory Visit: Payer: Self-pay | Admitting: Psychiatry

## 2022-10-23 DIAGNOSIS — F411 Generalized anxiety disorder: Secondary | ICD-10-CM

## 2022-11-06 ENCOUNTER — Other Ambulatory Visit: Payer: Self-pay | Admitting: Family Medicine

## 2022-11-06 ENCOUNTER — Other Ambulatory Visit: Payer: Self-pay | Admitting: Neurology

## 2022-11-06 DIAGNOSIS — E1159 Type 2 diabetes mellitus with other circulatory complications: Secondary | ICD-10-CM

## 2022-11-07 NOTE — Telephone Encounter (Signed)
Requested Prescriptions   Pending Prescriptions Disp Refills   gabapentin (NEURONTIN) 400 MG capsule [Pharmacy Med Name: Gabapentin 400 MG Oral Capsule] 360 capsule 0    Sig: TAKE 1 CAPSULE BY MOUTH IN THE MORNING THEN 1 CAPSULE MIDDAY THEN 2 CAPSULES IN THE EVENING   Last seen 01/10/22 slack, 12/17/22 upcoming. Routing to provider to fill Dispenses   Dispensed Days Supply Quantity Provider Pharmacy  GABAPENTIN 400MG     CAP 04/10/2022 90 360 each Glean Salvo, NP [facility] [phone] ...  GABAPENTIN 400MG     CAP 01/10/2022 90 360 each Glean Salvo, NP [facility] [phone] ...       How do dispenses affect the score?

## 2022-11-15 ENCOUNTER — Other Ambulatory Visit: Payer: Self-pay | Admitting: Family Medicine

## 2022-12-03 NOTE — Progress Notes (Unsigned)
Name: Beverly Barrett   MRN: [id number]    DOB: Jul 14, 1961   Date:12/04/2022       Progress Note  Subjective  Chief Complaint  Follow Up  HPI  Diabetes type 2 with dyslipidemia in obese: she has a long history of metabolic syndrome. A1C has been at goal, last level done 01/23 and it was 6.8 % , she is under the care of Endo - Dr. Gershon Crane.   She is on Metformin and also Actos and doing well, no side effects   She denies polyphagia, but has episodes of polydipsia and polyuria. .  She has some feet pain/ tingling /neuropathy , on gabapentin and symptoms are controlled . She also takes statin and ARB as prescribed . Last LDL was at goal down to 63. Remindedher again to not let CMA check her A1C in our office since she sees Endo   Low B12 and history of iron deficiency anemia: we will recheck labs she has SOB with activity , likely multifactorial, currently not taking any supplements   Chronic constipation: currently taking linzess 145 daily and bowel movements are controlled, she has bowel movements daily now    Major Depression/GAD: She is seeing Dr. Elna Breslow. She is off alprazolam, she has been off hdyroxizine , looks like she is only taking Mirtazapine and states she is feeling all right.Phq 9 is still high    HTN: She is on Benicar 40/25 , norvasc 2.5 mg and metoprolol, plus clonidine at nigh. Denies dizziness    Angina: had evaluation by cardiologist and Echo myoview . She  denies decrease in exercise tolerance, she has some fluttering at night and intermittent chest pain. . She has not taken NTG lately. She has some sob with moderate activity. She is still taking Crestor and last LDL was at goal Unchanged    OSA: using CPAP all night about  7 hours per night. Unchanged    Morbid obesity: BMI over 40, up only a couple of pounds since last visit, she is trying to follow a diabetic diet but weight is trending up, she is not walking as much , she is not getting out of her house since she got a  new trailer and no ramps. She is also worried about the dogs in her neighborhood   GERD:Seen by Dr. Doyle Askew , she is on Omeprazole 40 mg daily , she continues to have regurgitation with meals and sometimes with water, she states metoclopramide has helped with indigestion    Migraine: seen at the headache center by [facility]  currently on  gabapentin and Maxalt recently, off topamax, she did not get approved for botox. Insurance denied Nurtec    Hypothyroidism she is currently taking medication daily, she has chronic dry skin and chronic constipation controlled with linzess now  , last TSH was suppressed, she is taking 112 mcg daily and half on Sundays ,    DDD lumbar spine: had MRI done, ordered by Ortho and was found to have DDD lumbar spine, she was seen by Dr. Council Mechanic pain is usually mid back no recent radiculitis - advised to discuss it with ortho since she sees them for chronic left shoulder pain  IMPRESSION: 04/2022  1. Moderate tendinosis of the supraspinatus tendon with a partial-thickness articular surface tear. 2. Mild tendinosis of the infraspinatus tendon. 3. Severe tendinosis of the intra-articular portion of the long head of the biceps tendon. 4. Mild subacromial/subdeltoid bursitis.   Asthma Moderate persistent : she has a new trailer, the  previous had mold, she states her breathing has improved, she has allergies this time of the year, but no problems inside her house   Patient Active Problem List   Diagnosis Date Noted   Angina pectoris associated with type 2 diabetes mellitus (HCC) 12/04/2022   Polyp of colon    Chronic constipation 12/03/2021   Asthma, well controlled 12/03/2021   Migraine without aura and without status migrainosus, not intractable 12/03/2021   Memory loss 08/27/2021   Sleep disorder 07/19/2021   Dyslipidemia associated with type 2 diabetes mellitus (HCC) 03/06/2020   MDD (major depressive disorder), recurrent episode, moderate (HCC) 04/07/2019    Chronic low back pain 12/31/2018   Grade II internal hemorrhoids 05/16/2017   Mixed stress and urge urinary incontinence 11/12/2016   Degenerative arthritis of lumbar spine 11/12/2016   Common migraine with intractable migraine 09/20/2016   Iron deficiency anemia 09/12/2016   GAD (generalized anxiety disorder) 12/20/2015   OSA on CPAP 12/20/2015   Benign neoplasm of sigmoid colon    Chronic pain of multiple joints 06/14/2014   Arthritis, degenerative 06/14/2014   Morbid obesity with BMI of 40.0-44.9, adult (HCC) 06/14/2014   Hypothyroidism due to acquired atrophy of thyroid 08/18/2013   Hypertension goal BP (blood pressure) < 140/90 12/07/2010   Familial multiple lipoprotein-type hyperlipidemia 12/07/2010   Allergic rhinitis 10/13/2008    Past Surgical History:  Procedure Laterality Date   ABDOMINAL HYSTERECTOMY     bladder tach  1995   CESAREAN SECTION     COLONOSCOPY WITH PROPOFOL N/A 05/29/2015   Procedure: COLONOSCOPY WITH PROPOFOL;  Surgeon: Midge Minium, MD;  Location: [facility];  Service: Endoscopy;  Laterality: N/A;  Latex allergy sleep apnea - no CPAP machine (yet)   COLONOSCOPY WITH PROPOFOL N/A 12/18/2021   Procedure: COLONOSCOPY WITH PROPOFOL;  Surgeon: Toney Reil, MD;  Location: [facility];  Service: Gastroenterology;  Laterality: N/A;   ESOPHAGOGASTRODUODENOSCOPY N/A 04/23/2017   Procedure: ESOPHAGOGASTRODUODENOSCOPY (EGD);  Surgeon: Toney Reil, MD;  Location: [facility];  Service: Gastroenterology;  Laterality: N/A;  Latex allergy sleep apnea   POLYPECTOMY  05/29/2015   Procedure: POLYPECTOMY;  Surgeon: Midge Minium, MD;  Location: [facility];  Service: Endoscopy;;    Family History  Problem Relation Age of Onset   Diabetes Sister    Anxiety disorder Sister    Depression Sister    Hypertension Sister    Cirrhosis Mother    Diabetes Mother    Cirrhosis Father    Breast cancer Sister 24   Heart attack Brother     Alcohol abuse Brother    Drug abuse Brother    Prostate cancer Neg Hx    Kidney cancer Neg Hx    Bladder Cancer Neg Hx     Social History   Tobacco Use   Smoking status: Never   Smokeless tobacco: Never  Substance Use Topics   Alcohol use: No    Alcohol/week: 0.0 standard drinks of alcohol     Current Outpatient Medications:    acetaminophen (TYLENOL) 500 MG tablet, Take 2 tablets (1,000 mg total) by mouth every 6 (six) hours as needed., Disp: 30 tablet, Rfl: 0   albuterol (VENTOLIN HFA) 108 (90 Base) MCG/ACT inhaler, INHALE 2 PUFFS BY MOUTH EVERY 6 HOURS AS NEEDED FOR WHEEZING OR SHORTNESS OF BREATH, Disp: 2 each, Rfl: 0   aspirin 81 MG EC tablet, Take 81 mg by mouth daily., Disp: , Rfl:    estradiol (ESTRACE VAGINAL) 0.1 MG/GM vaginal  cream, Apply 0.5mg  (pea-sized amount)  just inside the vaginal introitus with a finger-tip on Monday, Wednesday and Friday nights., Disp: 30 g, Rfl: 12   fluticasone (FLONASE) 50 MCG/ACT nasal spray, Place 2 sprays into both nostrils daily as needed for allergies., Disp: 48 mL, Rfl: 0   gabapentin (NEURONTIN) 400 MG capsule, TAKE 1 CAPSULE BY MOUTH IN THE MORNING THEN 1 CAPSULE MIDDAY THEN 2 CAPSULES IN THE EVENING, Disp: 360 capsule, Rfl: 0   glucose blood test strip, Use as instructed, Disp: 100 each, Rfl: 12   ketoconazole (NIZORAL) 2 % cream, Apply 1 application. topically daily., Disp: 60 g, Rfl: 0   Lancet Devices (ONE TOUCH DELICA LANCING DEV) MISC, 1 each by Does not apply route daily., Disp: 1 each, Rfl: 0   levothyroxine (SYNTHROID) 112 MCG tablet, Take 112 mcg by mouth daily before breakfast. And half on Sundays, Disp: , Rfl:    metFORMIN (GLUCOPHAGE-XR) 750 MG 24 hr tablet, Take 1,500 mg by mouth daily with breakfast., Disp: , Rfl:    metoCLOPramide (REGLAN) 5 MG tablet, Take 1 tablet (5 mg total) by mouth daily before supper., Disp: 90 tablet, Rfl: 1   mirtazapine (REMERON) 7.5 MG tablet, TAKE 1 TABLET BY MOUTH AT BEDTIME, Disp: 90 tablet,  Rfl: 0   montelukast (SINGULAIR) 10 MG tablet, TAKE 1 TABLET AT BEDTIME, Disp: 90 tablet, Rfl: 1   nitroGLYCERIN (NITROSTAT) 0.4 MG SL tablet, Place 1 tablet (0.4 mg total) under the tongue every 5 (five) minutes as needed., Disp: 25 tablet, Rfl: 0   olmesartan-hydrochlorothiazide (BENICAR HCT) 40-25 MG tablet, Take 1 tablet by mouth once daily, Disp: 90 tablet, Rfl: 0   omeprazole (PRILOSEC) 40 MG capsule, Take 1 capsule (40 mg total) by mouth daily., Disp: 90 capsule, Rfl: 1   OneTouch Delica Lancets 33G MISC, 1 each by Does not apply route daily., Disp: 100 each, Rfl: 2   pioglitazone (ACTOS) 15 MG tablet, Take 15 mg by mouth daily., Disp: , Rfl:    rizatriptan (MAXALT) 10 MG tablet, May repeat in 2 hours if needed, Disp: 10 tablet, Rfl: 3   amLODipine (NORVASC) 2.5 MG tablet, Take 1 tablet (2.5 mg total) by mouth daily., Disp: 90 tablet, Rfl: 1   cloNIDine (CATAPRES) 0.1 MG tablet, Take 1 tablet (0.1 mg total) by mouth at bedtime., Disp: 90 tablet, Rfl: 1   fluticasone (FLOVENT HFA) 110 MCG/ACT inhaler, Inhale 2 puffs into the lungs 2 (two) times daily., Disp: 36 g, Rfl: 1   linaclotide (LINZESS) 145 MCG CAPS capsule, Take 1 capsule (145 mcg total) by mouth daily., Disp: 90 capsule, Rfl: 1   metoprolol succinate (TOPROL-XL) 50 MG 24 hr tablet, TAKE 1 TABLET BY MOUTH ONCE A DAY WITH OR IMMEDIATELY FOLLOWING A MEAL., Disp: 90 tablet, Rfl: 1   rosuvastatin (CRESTOR) 40 MG tablet, TAKE 1 TABLET BY MOUTH ONCE DAILY. IN PLACE OF ATORVASTATIN, Disp: 90 tablet, Rfl: 0  Allergies  Allergen Reactions   Latex Swelling    Pt unsure of reaction pt states something happened while in the hospital after surgery.    I personally reviewed active problem list, medication list, allergies, family history, social history, health maintenance with the patient/caregiver today.   ROS  Constitutional: Negative for fever or weight change.  Respiratory: Negative for cough and shortness of breath.    Cardiovascular: Negative for chest pain or palpitations.  Gastrointestinal: Negative for abdominal pain, no bowel changes.  Musculoskeletal: positive  for gait problem or joint swelling.  Skin: Negative for rash.  Neurological: Negative for dizziness or headache.  No other specific complaints in a complete review of systems (except as listed in HPI above).   Objective  Vitals:   12/04/22 1052  BP: 122/82  Pulse: 94  Resp: 16  Temp: 98 F (36.7 C)  TempSrc: Oral  SpO2: 96%  Weight: 224 lb 9.6 oz (101.9 kg)  Height: 5\' 1"  (1.549 m)    Body mass index is 42.44 kg/m.  Physical Exam  Constitutional: Patient appears well-developed and well-nourished. Obese  No distress.  HEENT: head atraumatic, normocephalic, pupils equal and reactive to light, neck supple Cardiovascular: Normal rate, regular rhythm and normal heart sounds.  No murmur heard. No BLE edema. Pulmonary/Chest: Effort normal and breath sounds normal. No respiratory distress. Abdominal: Soft.  There is no tenderness. Muscular skeletal; pain on right shoulder, normal rom of hip and knee  Psychiatric: Patient has a normal mood and affect. behavior is normal. Judgment and thought content normal.   Recent Results (from the past 2160 hour(s))  POCT HgB A1C     Status: Abnormal   Collection Time: 12/04/22 10:58 AM  Result Value Ref Range   Hemoglobin A1C 5.9 (A) 4.0 - 5.6 %   HbA1c POC (<> result, manual entry)     HbA1c, POC (prediabetic range)     HbA1c, POC (controlled diabetic range)      Diabetic Foot Exam: Diabetic Foot Exam - Simple   Simple Foot Form Visual Inspection See comments: Yes Sensation Testing See comments: Yes Pulse Check Posterior Tibialis and Dorsalis pulse intact bilaterally: Yes Comments She failed monofilament test, cannot localized sensation She has thick toenails, no pain       PHQ2/9:    12/04/2022   10:57 AM 08/06/2022    1:10 PM 08/05/2022   10:32 AM 05/15/2022   11:45 AM  04/08/2022    1:05 PM  Depression screen PHQ 2/9  Decreased Interest 2  1 0   Down, Depressed, Hopeless 2  2 0   PHQ - 2 Score 4  3 0   Altered sleeping 2  0    Tired, decreased energy 2  0    Change in appetite 0  0    Feeling bad or failure about yourself  2  2    Trouble concentrating 0  0    Moving slowly or fidgety/restless 0  0    Suicidal thoughts 0  0    PHQ-9 Score 10  5    Difficult doing work/chores Somewhat difficult         Information is confidential and restricted. Go to Review Flowsheets to unlock data.    phq 9 is positive   Fall Risk:    12/04/2022   10:51 AM 08/05/2022   10:32 AM 05/15/2022   11:45 AM 04/03/2022   10:12 AM 12/03/2021   12:56 PM  Fall Risk   Falls in the past year? 1 0 0 0 0  Number falls in past yr: 1 0 0 0   Injury with Fall? 0 0 0 0   Risk for fall due to : History of fall(s) Impaired balance/gait  Impaired balance/gait No Fall Risks  Follow up Falls evaluation completed;Education provided;Falls prevention discussed Falls prevention discussed Falls evaluation completed Falls prevention discussed Falls prevention discussed      Functional Status Survey: Is the patient deaf or have difficulty hearing?: No Does the patient have difficulty seeing, even when wearing glasses/contacts?: No Does the  patient have difficulty concentrating, remembering, or making decisions?: No Does the patient have difficulty walking or climbing stairs?: Yes Does the patient have difficulty dressing or bathing?: Yes Does the patient have difficulty doing errands alone such as visiting a doctor's office or shopping?: Yes    Assessment & Plan  1. Dyslipidemia associated with type 2 diabetes mellitus (HCC)  - HM Diabetes Foot Exam - POCT HgB A1C - rosuvastatin (CRESTOR) 40 MG tablet; TAKE 1 TABLET BY MOUTH ONCE DAILY. IN PLACE OF ATORVASTATIN  Dispense: 90 tablet; Refill: 0  2. Angina pectoris associated with type 2 diabetes mellitus (HCC)  On medical  management  3. MDD (major depressive disorder), recurrent episode, moderate (HCC)  Under the care of psychiatrist   4. Morbid obesity (HCC)  Discussed with the patient the risk posed by an increased BMI. Discussed importance of portion control, calorie counting and at least 150 minutes of physical activity weekly. Avoid sweet beverages and drink more water. Eat at least 6 servings of fruit and vegetables daily    5. Hypertension associated with type 2 diabetes mellitus (HCC)  - amLODipine (NORVASC) 2.5 MG tablet; Take 1 tablet (2.5 mg total) by mouth daily.  Dispense: 90 tablet; Refill: 1 - cloNIDine (CATAPRES) 0.1 MG tablet; Take 1 tablet (0.1 mg total) by mouth at bedtime.  Dispense: 90 tablet; Refill: 1 - metoprolol succinate (TOPROL-XL) 50 MG 24 hr tablet; TAKE 1 TABLET BY MOUTH ONCE A DAY WITH OR IMMEDIATELY FOLLOWING A MEAL.  Dispense: 90 tablet; Refill: 1  6. OSA on CPAP  Compliant   7. Migraine without aura and without status migrainosus, not intractable  Doing well at this time  8. Vitamin D deficiency  Taking supplementation   9. Iron deficiency anemia, unspecified iron deficiency anemia type  - CBC with Differential/Platelet - Iron, TIBC and Ferritin Panel  10. B12 deficiency  - B12 and Folate Panel  11. Hypothyroidism due to acquired atrophy of thyroid  Under the care of Dr. Gershon Crane   12. Gastroesophageal reflux disease without esophagitis   13. Chronic left shoulder pain  Under the care of ortho  14. Chronic midline low back pain without sciatica  She will discuss it with Ortho, no need to see [facility] since no radiculitis at this time   15. Asthma, moderate persistent, well-controlled  - fluticasone (FLOVENT HFA) 110 MCG/ACT inhaler; Inhale 2 puffs into the lungs 2 (two) times daily.  Dispense: 36 g; Refill: 1  16. Chronic idiopathic constipation  - linaclotide (LINZESS) 145 MCG CAPS capsule; Take 1 capsule (145 mcg total) by mouth daily.   Dispense: 90 capsule; Refill: 1

## 2022-12-04 ENCOUNTER — Ambulatory Visit (INDEPENDENT_AMBULATORY_CARE_PROVIDER_SITE_OTHER): Payer: Commercial Managed Care - HMO | Admitting: Family Medicine

## 2022-12-04 ENCOUNTER — Encounter: Payer: Self-pay | Admitting: Psychiatry

## 2022-12-04 ENCOUNTER — Ambulatory Visit (INDEPENDENT_AMBULATORY_CARE_PROVIDER_SITE_OTHER): Payer: Commercial Managed Care - HMO | Admitting: Psychiatry

## 2022-12-04 ENCOUNTER — Encounter: Payer: Self-pay | Admitting: Family Medicine

## 2022-12-04 VITALS — BP 144/81 | HR 76 | Temp 97.7°F | Ht 61.0 in | Wt 225.0 lb

## 2022-12-04 VITALS — BP 122/82 | HR 94 | Temp 98.0°F | Resp 16 | Ht 61.0 in | Wt 224.6 lb

## 2022-12-04 DIAGNOSIS — G4701 Insomnia due to medical condition: Secondary | ICD-10-CM | POA: Diagnosis not present

## 2022-12-04 DIAGNOSIS — I209 Angina pectoris, unspecified: Secondary | ICD-10-CM

## 2022-12-04 DIAGNOSIS — E034 Atrophy of thyroid (acquired): Secondary | ICD-10-CM

## 2022-12-04 DIAGNOSIS — K5904 Chronic idiopathic constipation: Secondary | ICD-10-CM

## 2022-12-04 DIAGNOSIS — E559 Vitamin D deficiency, unspecified: Secondary | ICD-10-CM

## 2022-12-04 DIAGNOSIS — F3342 Major depressive disorder, recurrent, in full remission: Secondary | ICD-10-CM

## 2022-12-04 DIAGNOSIS — E1159 Type 2 diabetes mellitus with other circulatory complications: Secondary | ICD-10-CM | POA: Diagnosis not present

## 2022-12-04 DIAGNOSIS — I152 Hypertension secondary to endocrine disorders: Secondary | ICD-10-CM

## 2022-12-04 DIAGNOSIS — M25512 Pain in left shoulder: Secondary | ICD-10-CM

## 2022-12-04 DIAGNOSIS — J4540 Moderate persistent asthma, uncomplicated: Secondary | ICD-10-CM

## 2022-12-04 DIAGNOSIS — F331 Major depressive disorder, recurrent, moderate: Secondary | ICD-10-CM | POA: Diagnosis not present

## 2022-12-04 DIAGNOSIS — F411 Generalized anxiety disorder: Secondary | ICD-10-CM

## 2022-12-04 DIAGNOSIS — G8929 Other chronic pain: Secondary | ICD-10-CM

## 2022-12-04 DIAGNOSIS — E538 Deficiency of other specified B group vitamins: Secondary | ICD-10-CM

## 2022-12-04 DIAGNOSIS — R413 Other amnesia: Secondary | ICD-10-CM

## 2022-12-04 DIAGNOSIS — E1169 Type 2 diabetes mellitus with other specified complication: Secondary | ICD-10-CM | POA: Diagnosis not present

## 2022-12-04 DIAGNOSIS — G4733 Obstructive sleep apnea (adult) (pediatric): Secondary | ICD-10-CM

## 2022-12-04 DIAGNOSIS — D509 Iron deficiency anemia, unspecified: Secondary | ICD-10-CM

## 2022-12-04 DIAGNOSIS — K219 Gastro-esophageal reflux disease without esophagitis: Secondary | ICD-10-CM

## 2022-12-04 DIAGNOSIS — G43009 Migraine without aura, not intractable, without status migrainosus: Secondary | ICD-10-CM

## 2022-12-04 DIAGNOSIS — Z7984 Long term (current) use of oral hypoglycemic drugs: Secondary | ICD-10-CM | POA: Diagnosis not present

## 2022-12-04 DIAGNOSIS — Z634 Disappearance and death of family member: Secondary | ICD-10-CM

## 2022-12-04 LAB — POCT GLYCOSYLATED HEMOGLOBIN (HGB A1C): Hemoglobin A1C: 5.9 % — AB (ref 4.0–5.6)

## 2022-12-04 MED ORDER — LINACLOTIDE 145 MCG PO CAPS: 145.0000 ug | ORAL_CAPSULE | Freq: Every day | ORAL | 1 refills | Status: DC

## 2022-12-04 MED ORDER — METOPROLOL SUCCINATE ER 50 MG PO TB24: ORAL_TABLET | ORAL | 1 refills | Status: DC

## 2022-12-04 MED ORDER — CLONIDINE HCL 0.1 MG PO TABS: 0.1000 mg | ORAL_TABLET | Freq: Every day | ORAL | 1 refills | Status: DC

## 2022-12-04 MED ORDER — ROSUVASTATIN CALCIUM 40 MG PO TABS: ORAL_TABLET | ORAL | 0 refills | Status: DC

## 2022-12-04 MED ORDER — AMLODIPINE BESYLATE 2.5 MG PO TABS: 2.5000 mg | ORAL_TABLET | Freq: Every day | ORAL | 1 refills | Status: DC

## 2022-12-04 MED ORDER — FLUTICASONE PROPIONATE HFA 110 MCG/ACT IN AERO: 2.0000 | INHALATION_SPRAY | Freq: Two times a day (BID) | RESPIRATORY_TRACT | 1 refills | Status: DC

## 2022-12-04 NOTE — Progress Notes (Signed)
BH MD OP Progress Note  12/04/2022 2:33 PM Beverly Barrett  MRN:  [id number]  Chief Complaint:  Chief Complaint  Patient presents with   Follow-up   Depression   Anxiety   Insomnia   Medication Refill   HPI: Beverly Barrett is a 62 year old African-American female, married, lives in [place], has a history of MDD, GAD, chronic pain, arthritis, history of OSA on CPAP, hypothyroidism, migraine headaches was evaluated in office today.  Patient today reports she continues to grieve the loss of her niece who passed away.  Patient reports after her niece passed she got a package in the mail that her niece had sent out prior to her death for her and the new grandbaby.  Patient reports that made her extremely sad.  Patient became tearful when she discussed this.  She however reports she does have good support system and has been coping well.  She is not interested in referral for grief counseling.  Patient otherwise denies any significant depression symptoms, hopelessness lack of motivation.  She reports she spends time with her grandbaby who is almost 38 years old.  They currently live with her daughter and grandbaby in a new trailer.  She enjoys it.  She has been doing a lot of things outside especially with her grandchild.  Patient reports sleep is overall good.  Patient appeared to be alert, oriented to person place time situation today.MMSE was completed in session today, patient scored 30 out of 30.  She is currently compliant on the mirtazapine.  Denies side effects.  Denies any other concerns today.   Visit Diagnosis:    ICD-10-CM   1. MDD (major depressive disorder), recurrent, in full remission (HCC)  F33.42     2. GAD (generalized anxiety disorder)  F41.1     3. Insomnia due to medical condition  G47.01    Mood. OSA on CPAP    4. Memory loss  R41.3     5. Bereavement  Z63.4       Past Psychiatric History: I have reviewed past psychiatric history from progress note on  01/21/2018.  Past Medical History:  Past Medical History:  Diagnosis Date   Anginal pain (HCC)    none in approx 2 yrs   Anxiety    Asthma    Chronic lower back pain    Common migraine with intractable migraine 09/20/2016   Depression    suicidal ideation   Diabetes mellitus without complication (HCC)    GERD (gastroesophageal reflux disease)    Headache    migraines -3x/wk   Hyperlipidemia    Hypertension    Hypothyroidism    Motion sickness    cars   Multilevel degenerative disc disease    Shortness of breath dyspnea    Sleep apnea    CPAP   Vertigo    Wears dentures    partial upper    Past Surgical History:  Procedure Laterality Date   ABDOMINAL HYSTERECTOMY     bladder tach  1995   CESAREAN SECTION     COLONOSCOPY WITH PROPOFOL N/A 05/29/2015   Procedure: COLONOSCOPY WITH PROPOFOL;  Surgeon: Midge Minium, MD;  Location: [facility];  Service: Endoscopy;  Laterality: N/A;  Latex allergy sleep apnea - no CPAP machine (yet)   COLONOSCOPY WITH PROPOFOL N/A 12/18/2021   Procedure: COLONOSCOPY WITH PROPOFOL;  Surgeon: Toney Reil, MD;  Location: [facility];  Service: Gastroenterology;  Laterality: N/A;   ESOPHAGOGASTRODUODENOSCOPY N/A 04/23/2017   Procedure: ESOPHAGOGASTRODUODENOSCOPY (EGD);  Surgeon: Toney Reil, MD;  Location: [facility];  Service: Gastroenterology;  Laterality: N/A;  Latex allergy sleep apnea   POLYPECTOMY  05/29/2015   Procedure: POLYPECTOMY;  Surgeon: Midge Minium, MD;  Location: [facility];  Service: Endoscopy;;    Family Psychiatric History: Reviewed family psychiatric history from progress note on 01/21/2018.  Family History:  Family History  Problem Relation Age of Onset   Diabetes Sister    Anxiety disorder Sister    Depression Sister    Hypertension Sister    Cirrhosis Mother    Diabetes Mother    Cirrhosis Father    Breast cancer Sister 52   Heart attack Brother    Alcohol abuse Brother     Drug abuse Brother    Prostate cancer Neg Hx    Kidney cancer Neg Hx    Bladder Cancer Neg Hx     Social History: Reviewed social history from progress note on 01/21/2018. Social History   Socioeconomic History   Marital status: Married    Spouse name: Training and development officer   Number of children: 3   Years of education: Not on file   Highest education level: GED or equivalent  Occupational History   Not on file  Tobacco Use   Smoking status: Never   Smokeless tobacco: Never  Vaping Use   Vaping Use: Never used  Substance and Sexual Activity   Alcohol use: No    Alcohol/week: 0.0 standard drinks of alcohol   Drug use: No   Sexual activity: Not Currently    Partners: Male  Other Topics Concern   Not on file  Social History Narrative   Lives with husband and one daughter other kids are out of the house   She does not work, husband on disability    Social Determinants of Health   Financial Resource Strain: High Risk (03/06/2020)   Overall Financial Resource Strain (CARDIA)    Difficulty of Paying Living Expenses: Very hard  Food Insecurity: Food Insecurity Present (07/14/2019)   Hunger Vital Sign    Worried About Running Out of Food in the Last Year: Sometimes true    Ran Out of Food in the Last Year: Sometimes true  Transportation Needs: No Transportation Needs (07/14/2019)   PRAPARE - Administrator, Civil Service (Medical): No    Lack of Transportation (Non-Medical): No  Physical Activity: Insufficiently Active (07/14/2019)   Exercise Vital Sign    Days of Exercise per Week: 6 days    Minutes of Exercise per Session: 10 min  Stress: Stress Concern Present (07/14/2019)   [employer] of Occupational Health - Occupational Stress Questionnaire    Feeling of Stress : To some extent  Social Connections: Moderately Integrated (07/14/2019)   Social Connection and Isolation Panel [NHANES]    Frequency of Communication with Friends and Family: More than three times a  week    Frequency of Social Gatherings with Friends and Family: More than three times a week    Attends Religious Services: More than 4 times per year    Active Member of [employer] or Organizations: No    Attends Banker Meetings: Never    Marital Status: Married    Allergies:  Allergies  Allergen Reactions   Latex Swelling    Pt unsure of reaction pt states something happened while in the hospital after surgery.    Metabolic Disorder Labs: Lab Results  Component Value Date   HGBA1C 5.9 (A) 12/04/2022  MPG 148 08/06/2021   MPG 162.81 08/28/2019   No results found for: "PROLACTIN" Lab Results  Component Value Date   CHOL 138 04/03/2022   TRIG 98 04/03/2022   HDL 57 04/03/2022   CHOLHDL 2.4 04/03/2022   VLDL 44 (H) 02/12/2017   LDLCALC 63 04/03/2022   LDLCALC 88 03/12/2021   Lab Results  Component Value Date   TSH 0.48 03/04/2022   TSH 0.01 (L) 12/03/2021    Therapeutic Level Labs: No results found for: "LITHIUM" No results found for: "VALPROATE" No results found for: "CBMZ"  Current Medications: Current Outpatient Medications  Medication Sig Dispense Refill   acetaminophen (TYLENOL) 500 MG tablet Take 2 tablets (1,000 mg total) by mouth every 6 (six) hours as needed. 30 tablet 0   albuterol (VENTOLIN HFA) 108 (90 Base) MCG/ACT inhaler INHALE 2 PUFFS BY MOUTH EVERY 6 HOURS AS NEEDED FOR WHEEZING OR SHORTNESS OF BREATH 2 each 0   amLODipine (NORVASC) 2.5 MG tablet Take 1 tablet (2.5 mg total) by mouth daily. 90 tablet 1   aspirin 81 MG EC tablet Take 81 mg by mouth daily.     cloNIDine (CATAPRES) 0.1 MG tablet Take 1 tablet (0.1 mg total) by mouth at bedtime. 90 tablet 1   estradiol (ESTRACE VAGINAL) 0.1 MG/GM vaginal cream Apply 0.5mg  (pea-sized amount)  just inside the vaginal introitus with a finger-tip on Monday, Wednesday and Friday nights. 30 g 12   fluticasone (FLONASE) 50 MCG/ACT nasal spray Place 2 sprays into both nostrils daily as needed for  allergies. 48 mL 0   fluticasone (FLOVENT HFA) 110 MCG/ACT inhaler Inhale 2 puffs into the lungs 2 (two) times daily. 36 g 1   gabapentin (NEURONTIN) 400 MG capsule TAKE 1 CAPSULE BY MOUTH IN THE MORNING THEN 1 CAPSULE MIDDAY THEN 2 CAPSULES IN THE EVENING 360 capsule 0   glucose blood test strip Use as instructed 100 each 12   ketoconazole (NIZORAL) 2 % cream Apply 1 application. topically daily. 60 g 0   Lancet Devices (ONE TOUCH DELICA LANCING DEV) MISC 1 each by Does not apply route daily. 1 each 0   levothyroxine (SYNTHROID) 112 MCG tablet Take 112 mcg by mouth daily before breakfast. And half on Sundays     linaclotide (LINZESS) 145 MCG CAPS capsule Take 1 capsule (145 mcg total) by mouth daily. 90 capsule 1   metFORMIN (GLUCOPHAGE-XR) 750 MG 24 hr tablet Take 1,500 mg by mouth daily with breakfast.     metoCLOPramide (REGLAN) 5 MG tablet Take 1 tablet (5 mg total) by mouth daily before supper. 90 tablet 1   metoprolol succinate (TOPROL-XL) 50 MG 24 hr tablet TAKE 1 TABLET BY MOUTH ONCE A DAY WITH OR IMMEDIATELY FOLLOWING A MEAL. 90 tablet 1   mirtazapine (REMERON) 7.5 MG tablet TAKE 1 TABLET BY MOUTH AT BEDTIME 90 tablet 0   montelukast (SINGULAIR) 10 MG tablet TAKE 1 TABLET AT BEDTIME 90 tablet 1   nitroGLYCERIN (NITROSTAT) 0.4 MG SL tablet Place 1 tablet (0.4 mg total) under the tongue every 5 (five) minutes as needed. 25 tablet 0   olmesartan-hydrochlorothiazide (BENICAR HCT) 40-25 MG tablet Take 1 tablet by mouth once daily 90 tablet 0   omeprazole (PRILOSEC) 40 MG capsule Take 1 capsule (40 mg total) by mouth daily. 90 capsule 1   OneTouch Delica Lancets 33G MISC 1 each by Does not apply route daily. 100 each 2   pioglitazone (ACTOS) 15 MG tablet Take 15 mg by mouth daily.  rizatriptan (MAXALT) 10 MG tablet May repeat in 2 hours if needed 10 tablet 3   rosuvastatin (CRESTOR) 40 MG tablet TAKE 1 TABLET BY MOUTH ONCE DAILY. IN PLACE OF ATORVASTATIN 90 tablet 0   No current  facility-administered medications for this visit.     Musculoskeletal: Strength & Muscle Tone: within normal limits Gait & Station: normal Patient leans: N/A  Psychiatric Specialty Exam: Review of Systems  Psychiatric/Behavioral:         Grieving  All other systems reviewed and are negative.   Blood pressure (!) 144/81, pulse 76, temperature 97.7 F (36.5 C), temperature source Skin, height 5\' 1"  (1.549 m), weight 225 lb (102.1 kg).Body mass index is 42.51 kg/m.  General Appearance: Casual  Eye Contact:  Good  Speech:  Clear and Coherent  Volume:  Normal  Mood:   grieving  Affect:  Tearful  Thought Process:  Goal Directed and Descriptions of Associations: Intact  Orientation:  Full (Time, Place, and Person)  Thought Content: Logical   Suicidal Thoughts:  No  Homicidal Thoughts:  No  Memory:  Immediate;   Fair Recent;   Fair Remote;   Fair  Judgement:  Fair  Insight:  Fair  Psychomotor Activity:  Normal  Concentration:  Concentration: Fair and Attention Span: Fair  Recall:  [employer] of Knowledge: Fair  Language: Fair  Akathisia:  No  Handed:  Right  AIMS (if indicated): not done  Assets:  Communication Skills Desire for Improvement Housing Social Support  ADL's:  Intact  Cognition: WNL  Sleep:  Fair   Screenings: AIMS    Flowsheet Row Office Visit from 12/26/2021 in [facility] Office Visit from 10/24/2021 in [facility]  AIMS Total Score 0 0      GAD-7    Flowsheet Row Office Visit from 12/04/2022 in [facility] Most recent reading at 12/04/2022  2:26 PM Office Visit from 12/04/2022 in [facility] Most recent reading at 12/04/2022 10:58 AM Office Visit from 08/06/2022 in [facility] Most recent reading at 08/06/2022  1:11 PM Office Visit from 04/08/2022 in [facility] Most recent reading at 04/08/2022  1:06 PM Office Visit from 12/26/2021 in [facility] Most recent reading at 12/26/2021  1:12 PM  Total GAD-7 Score 7 10 6 10 3       Mini-Mental    Flowsheet Row Office Visit from 12/04/2022 in [facility]  Total Score (max 30 points ) 30      PHQ2-9    Flowsheet Row Office Visit from 12/04/2022 in [facility] Most recent reading at 12/04/2022  2:26 PM Office Visit from 12/04/2022 in [facility] Most recent reading at 12/04/2022 10:57 AM Office Visit from 08/06/2022 in [facility] Most recent reading at 08/06/2022  1:10 PM Office Visit from 08/05/2022 in [facility] Most recent reading at 08/05/2022 10:32 AM Office Visit from 05/15/2022 in [facility] Most recent reading at 05/15/2022 11:45 AM  PHQ-2 Total Score 3 4 2 3  0  PHQ-9 Total Score 5 10 5 5  --      Flowsheet Row Office Visit from 12/04/2022 in [facility] Office Visit from 08/06/2022 in [facility] Office Visit from  04/08/2022 in [facility]  C-SSRS RISK CATEGORY No Risk No Risk No Risk        Assessment and Plan: Beverly Barrett is a 62 year old African-American female, married, lives in [place], has a history of MDD, GAD, insomnia, OSA on CPAP, migraine headaches, hypothyroidism was evaluated in office today.  Patient is currently grieving although managing okay, declines referral to grief counseling, currently otherwise stable on medications.  Plan as noted below.  Plan MDD in remission Mirtazapine 7.5 mg p.o. nightly  GAD-stable Mirtazapine 7.5 mg p.o. nightly She is also on gabapentin although prescribed  for pain it also helps with anxiety.  Insomnia-stable Continue CPAP for OSA Mirtazapine 7.5 mg p.o. nightly  Memory loss-resolved MMSE - 04/08/2022 - 27/30 MMSE - Today - 30/30 Patient currently receiving vitamin B12 replacement per primary care provider.   Bereavement-improving Patient declines referral for grief counseling.  Will monitor closely  Follow-up in clinic in 5 months or sooner if needed.   Collaboration of Care: Collaboration of Care: Patient refused AEB patient declined referral for grief counseling.  Patient/Guardian was advised Release of Information must be obtained prior to any record release in order to collaborate their care with an outside provider. Patient/Guardian was advised if they have not already done so to contact the registration department to sign all necessary forms in order for [place] to release information regarding their care.   Consent: Patient/Guardian gives verbal consent for treatment and assignment of benefits for services provided during this visit. Patient/Guardian expressed understanding and agreed to proceed.   This note was generated in part or whole with voice recognition software. Voice recognition is usually quite accurate but there are transcription errors that can and very often do occur. I apologize for any typographical errors that were not detected and corrected.    Jomarie Longs, MD 12/06/2022, 8:22 AM

## 2022-12-05 LAB — B12 AND FOLATE PANEL
Folate: 13.2 ng/mL
Vitamin B-12: 356 pg/mL (ref 200–1100)

## 2022-12-05 LAB — IRON,TIBC AND FERRITIN PANEL
%SAT: 13 % (calc) — ABNORMAL LOW (ref 16–45)
Ferritin: 18 ng/mL (ref 16–288)
Iron: 57 ug/dL (ref 45–160)
TIBC: 424 mcg/dL (calc) (ref 250–450)

## 2022-12-05 LAB — CBC WITH DIFFERENTIAL/PLATELET
Absolute Monocytes: 737 cells/uL (ref 200–950)
Basophils Absolute: 49 cells/uL (ref 0–200)
Basophils Relative: 0.6 %
Eosinophils Absolute: 73 cells/uL (ref 15–500)
Eosinophils Relative: 0.9 %
HCT: 36.9 % (ref 35.0–45.0)
Hemoglobin: 12 g/dL (ref 11.7–15.5)
Lymphs Abs: 1985 cells/uL (ref 850–3900)
MCH: 27.9 pg (ref 27.0–33.0)
MCHC: 32.5 g/dL (ref 32.0–36.0)
MCV: 85.8 fL (ref 80.0–100.0)
MPV: 10.6 fL (ref 7.5–12.5)
Monocytes Relative: 9.1 %
Neutro Abs: 5257 cells/uL (ref 1500–7800)
Neutrophils Relative %: 64.9 %
Platelets: 361 10*3/uL (ref 140–400)
RBC: 4.3 10*6/uL (ref 3.80–5.10)
RDW: 14.4 % (ref 11.0–15.0)
Total Lymphocyte: 24.5 %
WBC: 8.1 10*3/uL (ref 3.8–10.8)
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
## 2022-12-17 ENCOUNTER — Other Ambulatory Visit (HOSPITAL_COMMUNITY): Payer: Self-pay

## 2022-12-17 ENCOUNTER — Encounter: Payer: Self-pay | Admitting: Neurology

## 2022-12-17 ENCOUNTER — Ambulatory Visit (INDEPENDENT_AMBULATORY_CARE_PROVIDER_SITE_OTHER): Payer: Commercial Managed Care - HMO | Admitting: Neurology

## 2022-12-17 ENCOUNTER — Telehealth: Payer: Self-pay

## 2022-12-17 VITALS — BP 132/78 | HR 78 | Ht 61.0 in | Wt 223.5 lb

## 2022-12-17 DIAGNOSIS — G43019 Migraine without aura, intractable, without status migrainosus: Secondary | ICD-10-CM | POA: Diagnosis not present

## 2022-12-17 MED ORDER — EMGALITY 120 MG/ML ~~LOC~~ SOAJ: 1.0000 | SUBCUTANEOUS | 11 refills | Status: DC

## 2022-12-17 MED ORDER — GABAPENTIN 400 MG PO CAPS: ORAL_CAPSULE | ORAL | 3 refills | Status: DC

## 2022-12-17 MED ORDER — RIZATRIPTAN BENZOATE 10 MG PO TABS: ORAL_TABLET | ORAL | 11 refills | Status: DC

## 2022-12-17 MED ORDER — EMGALITY 120 MG/ML ~~LOC~~ SOAJ: 240.0000 mg | Freq: Once | SUBCUTANEOUS | 0 refills | Status: AC

## 2022-12-17 NOTE — Telephone Encounter (Signed)
Pharmacy Patient Advocate Encounter   Received notification from [facility] that prior authorization for Emgality 120MG /ML auto-injectors (migraine) (Loading Dose ) is required/requested.   PA submitted on 12/17/2022 to (ins) [employer] via CoverMyMeds Key or [facility]) confirmation # [id number] Status is pending

## 2022-12-17 NOTE — Patient Instructions (Signed)
Start Emgality for migraine prevention, 1st month is 2 injections as loading dose followed 30 days later by 1 shot for monthly maintenance dosing  Be care with the goody not to over use, no more than 2 days a week   Keep me posted, if Emgality is not covered, will try something else! :)

## 2022-12-19 NOTE — Telephone Encounter (Signed)
Patient Advocate Encounter  Prior Authorization for Emgality 120MG /ML auto-injectors (migraine) has been approved.    PA# [id number] Proofreader Electronic PA Form Effective dates: 12/17/2022 through 06/15/2023

## 2023-01-16 LAB — HEMOGLOBIN A1C: Hemoglobin A1C: 6.2

## 2023-01-21 ENCOUNTER — Other Ambulatory Visit: Payer: Self-pay | Admitting: Neurology

## 2023-01-22 MED ORDER — EMGALITY 120 MG/ML ~~LOC~~ SOAJ: 120.0000 mg | SUBCUTANEOUS | 11 refills | Status: DC

## 2023-01-22 NOTE — Telephone Encounter (Signed)
Emgality should have been sent in as maintenance dosing 120 mg monthly injection once completed the loading dose 240 mg. Thanks   Meds ordered this encounter  Medications   DISCONTD: EMGALITY 120 MG/ML SOAJ    Sig: INJECT 240MG  SUBCUTANEOUSLY ONCE    Dispense:  2 mL    Refill:  0   Galcanezumab-gnlm (EMGALITY) 120 MG/ML SOAJ    Sig: Inject 120 mg into the skin every 30 (thirty) days.    Dispense:  1.12 mL    Refill:  11    Fill this as loading dose

## 2023-01-22 NOTE — Telephone Encounter (Signed)
[facility] # [phone] Lurena Joiner) calling to verify prescription loading dose. Loading dose was given 12/18/22. Asking are you going to do a another of the 240mg  or go down to the 120mg  Can contact at [phone]

## 2023-01-24 ENCOUNTER — Other Ambulatory Visit: Payer: Self-pay | Admitting: Family Medicine

## 2023-01-24 DIAGNOSIS — Z1231 Encounter for screening mammogram for malignant neoplasm of breast: Secondary | ICD-10-CM

## 2023-01-27 ENCOUNTER — Ambulatory Visit
Admission: RE | Admit: 2023-01-27 | Discharge: 2023-01-27 | Disposition: A | Discharge status: Home / Self Care | Payer: Commercial Managed Care - HMO | Source: Ambulatory Visit | Attending: Family Medicine | Admitting: Family Medicine

## 2023-01-27 DIAGNOSIS — Z1231 Encounter for screening mammogram for malignant neoplasm of breast: Secondary | ICD-10-CM | POA: Diagnosis present

## 2023-02-05 ENCOUNTER — Other Ambulatory Visit: Payer: Self-pay | Admitting: Psychiatry

## 2023-02-05 DIAGNOSIS — F411 Generalized anxiety disorder: Secondary | ICD-10-CM

## 2023-04-03 NOTE — Progress Notes (Signed)
Name: Beverly Barrett   MRN: [id number]    DOB: [birth year]   Date:04/04/2023       Progress Note  Subjective  Chief Complaint  Follow Up  HPI  Diabetes type 2 with dyslipidemia in obese: she has a long history of metabolic syndrome. A1C has been at goal, it was 6.2 % in June 2024 at [facility] office.   She is on Metformin and Actos and doing well, however she is worried about her weight gain.  She denies polyphagia, but has episodes of polydipsia and polyuria. .  She has some feet pain/ tingling /neuropathy , on gabapentin and symptoms are controlled . She also takes statin and ARB as prescribed . We will recheck lipid and urine micro today   Low B12 and history of iron deficiency anemia: she is taking supplements now.   We will recheck labs today   Chronic constipation: currently taking linzess 145 daily and bowel movements are controlled, she has bowel movements daily now . No straining or blood in stools    Major Depression/GAD: She is seeing Dr. Elna Breslow. She is off alprazolam, she has been off hdyroxizine , looks like she is only taking Mirtazapine and states she is feeling all right, however she is now gaining weight.    HTN: She is on Benicar 40/25 , norvasc 2.5 mg and metoprolol, plus clonidine at nigh. Denies dizziness , BP is at goal    Angina: had evaluation by cardiologist and Echo myoview . She  denies decrease in exercise tolerance, she has some fluttering at night but no recent episodes of chest pain  . She has not taken NTG lately. She has some sob with moderate activity. She is still taking Crestor and last LDL was at goal and we will recheck labs today    OSA: using CPAP all night about  7 hours per night. Continue current regiment    Morbid obesity: BMI over 40, weight is going up again, she is frustrated, discussed possible medications that are causing weight gain like actos, remerol and gabapentin   GERD:Seen by Dr. Doyle Askew , she is on Omeprazole 40 mg daily , she  continues to have regurgitation with meals and sometimes with water, she states metoclopramide has helped with indigestion . Stable    Migraine: seen at the headache center by [facility]  currently on  gabapentin and Maxalt recently, off topamax, she did not get approved for botox. She is now on Emgality and seems to be helping with symptoms    Hypothyroidism she is currently taking medication daily, she has chronic dry skin and chronic constipation controlled with linzess now  , last TSH was suppressed, she is taking 112 mcg daily and half on Sundays , monitored by Dr. Gershon Crane    DDD lumbar spine: had MRI done, ordered by Ortho and was found to have DDD lumbar spine, she was seen by Dr. Council Mechanic pain is usually mid back no recent radiculitis - she needs to follow up , pain is getting worse and has to wear a back brace and uses a cane for ambulation   IMPRESSION: 04/2022  1. Moderate tendinosis of the supraspinatus tendon with a partial-thickness articular surface tear. 2. Mild tendinosis of the infraspinatus tendon. 3. Severe tendinosis of the intra-articular portion of the long head of the biceps tendon. 4. Mild subacromial/subdeltoid bursitis.   Asthma Moderate persistent : she has a new trailer, the previous had mold, she states her breathing has improved. Continue Flovent   Patient  Active Problem List   Diagnosis Date Noted   Angina pectoris associated with type 2 diabetes mellitus (HCC) 12/04/2022   Insomnia due to medical condition 12/04/2022   Bereavement 12/04/2022   MDD (major depressive disorder), recurrent, in full remission (HCC) 12/26/2021   Polyp of colon    Chronic constipation 12/03/2021   Asthma, well controlled 12/03/2021   Migraine without aura and without status migrainosus, not intractable 12/03/2021   Memory loss 08/27/2021   Sleep disorder 07/19/2021   Dyslipidemia associated with type 2 diabetes mellitus (HCC) 03/06/2020   MDD (major depressive disorder),  recurrent episode, moderate (HCC) 04/07/2019   Chronic low back pain 12/31/2018   Grade II internal hemorrhoids 05/16/2017   Mixed stress and urge urinary incontinence 11/12/2016   Degenerative arthritis of lumbar spine 11/12/2016   Common migraine with intractable migraine 09/20/2016   Iron deficiency anemia 09/12/2016   GAD (generalized anxiety disorder) 12/20/2015   OSA on CPAP 12/20/2015   Benign neoplasm of sigmoid colon    Chronic pain of multiple joints 06/14/2014   Arthritis, degenerative 06/14/2014   Morbid obesity with BMI of 40.0-44.9, adult (HCC) 06/14/2014   Hypothyroidism due to acquired atrophy of thyroid 08/18/2013   Hypertension goal BP (blood pressure) < 140/90 12/07/2010   Familial multiple lipoprotein-type hyperlipidemia 12/07/2010   Allergic rhinitis 10/13/2008    Past Surgical History:  Procedure Laterality Date   ABDOMINAL HYSTERECTOMY     bladder tach  1995   CESAREAN SECTION     COLONOSCOPY WITH PROPOFOL N/A 05/29/2015   Procedure: COLONOSCOPY WITH PROPOFOL;  Surgeon: Midge Minium, MD;  Location: [facility];  Service: Endoscopy;  Laterality: N/A;  Latex allergy sleep apnea - no CPAP machine (yet)   COLONOSCOPY WITH PROPOFOL N/A 12/18/2021   Procedure: COLONOSCOPY WITH PROPOFOL;  Surgeon: Toney Reil, MD;  [facility];  Service: Gastroenterology;  Laterality: N/A;   ESOPHAGOGASTRODUODENOSCOPY N/A 04/23/2017   Procedure: ESOPHAGOGASTRODUODENOSCOPY (EGD);  Surgeon: Toney Reil, MD;  Location: [facility];  Service: Gastroenterology;  Laterality: N/A;  Latex allergy sleep apnea   POLYPECTOMY  05/29/2015   Procedure: POLYPECTOMY;  Surgeon: Midge Minium, MD;  Location: [facility];  Service: Endoscopy;;    Family History  Problem Relation Age of Onset   Diabetes Sister    Anxiety disorder Sister    Depression Sister    Hypertension Sister    Cirrhosis Mother    Diabetes Mother    Cirrhosis Father     Breast cancer Sister 41   Heart attack Brother    Alcohol abuse Brother    Drug abuse Brother    Prostate cancer Neg Hx    Kidney cancer Neg Hx    Bladder Cancer Neg Hx     Social History   Tobacco Use   Smoking status: Never   Smokeless tobacco: Never  Substance Use Topics   Alcohol use: No    Alcohol/week: 0.0 standard drinks of alcohol     Current Outpatient Medications:    acetaminophen (TYLENOL) 500 MG tablet, Take 2 tablets (1,000 mg total) by mouth every 6 (six) hours as needed., Disp: 30 tablet, Rfl: 0   albuterol (VENTOLIN HFA) 108 (90 Base) MCG/ACT inhaler, INHALE 2 PUFFS BY MOUTH EVERY 6 HOURS AS NEEDED FOR WHEEZING OR SHORTNESS OF BREATH, Disp: 2 each, Rfl: 0   aspirin 81 MG EC tablet, Take 81 mg by mouth daily., Disp: , Rfl:    Aspirin-Acetaminophen-Caffeine (GOODY HEADACHE PO), Take 1 applicator by  mouth every other day., Disp: , Rfl:    estradiol (ESTRACE VAGINAL) 0.1 MG/GM vaginal cream, Apply 0.5mg  (pea-sized amount)  just inside the vaginal introitus with a finger-tip on Monday, Wednesday and Friday nights., Disp: 30 g, Rfl: 12   fluticasone (FLONASE) 50 MCG/ACT nasal spray, Place 2 sprays into both nostrils daily as needed for allergies., Disp: 48 mL, Rfl: 0   gabapentin (NEURONTIN) 400 MG capsule, TAKE 1 CAPSULE BY MOUTH IN THE MORNING THEN 1 CAPSULE MIDDAY THEN 2 CAPSULES IN THE EVENING, Disp: 360 capsule, Rfl: 3   Galcanezumab-gnlm (EMGALITY) 120 MG/ML SOAJ, Inject 120 mg into the skin every 30 (thirty) days., Disp: 1.12 mL, Rfl: 11   glucose blood test strip, Use as instructed, Disp: 100 each, Rfl: 12   ketoconazole (NIZORAL) 2 % cream, Apply 1 application. topically daily., Disp: 60 g, Rfl: 0   Lancet Devices (ONE TOUCH DELICA LANCING DEV) MISC, 1 each by Does not apply route daily., Disp: 1 each, Rfl: 0   levothyroxine (SYNTHROID) 112 MCG tablet, Take 112 mcg by mouth daily before breakfast. And half on Sundays, Disp: , Rfl:    metFORMIN (GLUCOPHAGE-XR) 750  MG 24 hr tablet, Take 1,500 mg by mouth daily with breakfast., Disp: , Rfl:    metoCLOPramide (REGLAN) 5 MG tablet, Take 1 tablet (5 mg total) by mouth daily before supper., Disp: 90 tablet, Rfl: 1   mirtazapine (REMERON) 7.5 MG tablet, TAKE 1 TABLET BY MOUTH AT BEDTIME, Disp: 90 tablet, Rfl: 0   nitroGLYCERIN (NITROSTAT) 0.4 MG SL tablet, Place 1 tablet (0.4 mg total) under the tongue every 5 (five) minutes as needed., Disp: 25 tablet, Rfl: 0   OneTouch Delica Lancets 33G MISC, 1 each by Does not apply route daily., Disp: 100 each, Rfl: 2   pioglitazone (ACTOS) 15 MG tablet, Take 15 mg by mouth daily., Disp: , Rfl:    rizatriptan (MAXALT) 10 MG tablet, May repeat in 2 hours if needed, Disp: 10 tablet, Rfl: 11   amLODipine (NORVASC) 2.5 MG tablet, Take 1 tablet (2.5 mg total) by mouth daily., Disp: 90 tablet, Rfl: 1   cloNIDine (CATAPRES) 0.1 MG tablet, Take 1 tablet (0.1 mg total) by mouth at bedtime., Disp: 90 tablet, Rfl: 1   fluticasone (FLOVENT HFA) 110 MCG/ACT inhaler, Inhale 2 puffs into the lungs 2 (two) times daily., Disp: 36 g, Rfl: 1   linaclotide (LINZESS) 145 MCG CAPS capsule, Take 1 capsule (145 mcg total) by mouth daily., Disp: 90 capsule, Rfl: 1   metoprolol succinate (TOPROL-XL) 50 MG 24 hr tablet, TAKE 1 TABLET BY MOUTH ONCE A DAY WITH OR IMMEDIATELY FOLLOWING A MEAL., Disp: 90 tablet, Rfl: 1   montelukast (SINGULAIR) 10 MG tablet, Take 1 tablet (10 mg total) by mouth at bedtime., Disp: 90 tablet, Rfl: 1   olmesartan-hydrochlorothiazide (BENICAR HCT) 40-25 MG tablet, Take 1 tablet by mouth daily., Disp: 90 tablet, Rfl: 1   omeprazole (PRILOSEC) 40 MG capsule, Take 1 capsule (40 mg total) by mouth daily., Disp: 90 capsule, Rfl: 1   rosuvastatin (CRESTOR) 40 MG tablet, TAKE 1 TABLET BY MOUTH ONCE DAILY. IN PLACE OF ATORVASTATIN, Disp: 90 tablet, Rfl: 1  Allergies  Allergen Reactions   Latex Swelling    Pt unsure of reaction pt states something happened while in the hospital after  surgery.    I personally reviewed active problem list, medication list, allergies, family history, social history, health maintenance with the patient/caregiver today.   ROS  Ten systems reviewed  and is negative except as mentioned in HPI    Objective  Vitals:   04/04/23 1007  BP: 126/78  Pulse: 90  Resp: 16  Temp: 98 F (36.7 C)  TempSrc: Oral  SpO2: 94%  Weight: 232 lb 6.4 oz (105.4 kg)  Height: 5\' 1"  (1.549 m)    Body mass index is 43.91 kg/m.  Physical exam   Constitutional: Patient appears well-developed and well-nourished. Obese  No distress.  HEENT: head atraumatic, normocephalic, pupils equal and reactive to light,, neck supple, Cardiovascular: Normal rate, regular rhythm and normal heart sounds.  No murmur heard. No BLE edema. Pulmonary/Chest: Effort normal and breath sounds normal. No respiratory distress. Abdominal: Soft.  There is no tenderness. Muscular skeletal: pain during palpation of lumbar spine, using a cane Psychiatric: Patient has a normal mood and affect. behavior is normal. Judgment and thought content normal.   Recent Results (from the past 2160 hour(s))  Hemoglobin A1c     Status: None   Collection Time: 01/16/23 12:00 AM  Result Value Ref Range   Hemoglobin A1C 6.2     PHQ2/9:    04/04/2023   10:09 AM 12/04/2022    2:26 PM 12/04/2022   10:57 AM 08/06/2022    1:10 PM 08/05/2022   10:32 AM  Depression screen PHQ 2/9  Decreased Interest 2  2  1   Down, Depressed, Hopeless 2  2  2   PHQ - 2 Score 4  4  3   Altered sleeping 2  2  0  Tired, decreased energy 2  2  0  Change in appetite 2  0  0  Feeling bad or failure about yourself  1  2  2   Trouble concentrating 0  0  0  Moving slowly or fidgety/restless 0  0  0  Suicidal thoughts 0  0  0  PHQ-9 Score 11  10  5   Difficult doing work/chores Somewhat difficult  Somewhat difficult       Information is confidential and restricted. Go to Review Flowsheets to unlock data.    phq 9 is  positive   Fall Risk:    04/04/2023   10:09 AM 12/04/2022   10:51 AM 08/05/2022   10:32 AM 05/15/2022   11:45 AM 04/03/2022   10:12 AM  Fall Risk   Falls in the past year? 0 1 0 0 0  Number falls in past yr:  1 0 0 0  Injury with Fall?  0 0 0 0  Risk for fall due to : No Fall Risks History of fall(s) Impaired balance/gait  Impaired balance/gait  Follow up Falls prevention discussed Falls evaluation completed;Education provided;Falls prevention discussed Falls prevention discussed Falls evaluation completed Falls prevention discussed      Functional Status Survey: Is the patient deaf or have difficulty hearing?: No Does the patient have difficulty seeing, even when wearing glasses/contacts?: No Does the patient have difficulty concentrating, remembering, or making decisions?: No Does the patient have difficulty walking or climbing stairs?: Yes Does the patient have difficulty dressing or bathing?: Yes Does the patient have difficulty doing errands alone such as visiting a doctor's office or shopping?: Yes    Assessment & Plan  1. Dyslipidemia associated with type 2 diabetes mellitus (HCC)  - COMPLETE METABOLIC PANEL WITH GFR - Urine Microalbumin w/creat. ratio - olmesartan-hydrochlorothiazide (BENICAR HCT) 40-25 MG tablet; Take 1 tablet by mouth daily.  Dispense: 90 tablet; Refill: 1 - rosuvastatin (CRESTOR) 40 MG tablet; TAKE 1 TABLET BY MOUTH ONCE  DAILY. IN PLACE OF ATORVASTATIN  Dispense: 90 tablet; Refill: 1 - Lipid panel  2. Hypertension associated with type 2 diabetes mellitus (HCC)  - cloNIDine (CATAPRES) 0.1 MG tablet; Take 1 tablet (0.1 mg total) by mouth at bedtime.  Dispense: 90 tablet; Refill: 1 - amLODipine (NORVASC) 2.5 MG tablet; Take 1 tablet (2.5 mg total) by mouth daily.  Dispense: 90 tablet; Refill: 1 - metoprolol succinate (TOPROL-XL) 50 MG 24 hr tablet; TAKE 1 TABLET BY MOUTH ONCE A DAY WITH OR IMMEDIATELY FOLLOWING A MEAL.  Dispense: 90 tablet; Refill:  1  3. Morbid obesity (HCC)  Discussed with the patient the risk posed by an increased BMI. Discussed importance of portion control, calorie counting and at least 150 minutes of physical activity weekly. Avoid sweet beverages and drink more water. Eat at least 6 servings of fruit and vegetables daily    4. Angina pectoris associated with type 2 diabetes mellitus (HCC)  - olmesartan-hydrochlorothiazide (BENICAR HCT) 40-25 MG tablet; Take 1 tablet by mouth daily.  Dispense: 90 tablet; Refill: 1  5. MDD (major depressive disorder), recurrent episode, moderate (HCC)  Keep follow up with Dr. Elna Breslow   6. Asthma, moderate persistent, well-controlled  - montelukast (SINGULAIR) 10 MG tablet; Take 1 tablet (10 mg total) by mouth at bedtime.  Dispense: 90 tablet; Refill: 1 - fluticasone (FLOVENT HFA) 110 MCG/ACT inhaler; Inhale 2 puffs into the lungs 2 (two) times daily.  Dispense: 36 g; Refill: 1  7. Vitamin D deficiency  - VITAMIN D 25 Hydroxy (Vit-D Deficiency, Fractures)  8. Gastroesophageal reflux disease without esophagitis  - omeprazole (PRILOSEC) 40 MG capsule; Take 1 capsule (40 mg total) by mouth daily.  Dispense: 90 capsule; Refill: 1  9. B12 deficiency  - CBC with Differential/Platelet - B12 and Folate Panel  10. Migraine without aura and without status migrainosus, not intractable  Improved   11. Iron deficiency anemia, unspecified iron deficiency anemia type  - CBC with Differential/Platelet - Iron, TIBC and Ferritin Panel  12. Chronic idiopathic constipation  - linaclotide (LINZESS) 145 MCG CAPS capsule; Take 1 capsule (145 mcg total) by mouth daily.  Dispense: 90 capsule; Refill: 1  13. Needs flu shot  - Flu vaccine trivalent PF, 6mos and older(Flulaval,Afluria,Fluarix,Fluzone)

## 2023-04-04 ENCOUNTER — Ambulatory Visit (INDEPENDENT_AMBULATORY_CARE_PROVIDER_SITE_OTHER): Payer: Commercial Managed Care - HMO | Admitting: Family Medicine

## 2023-04-04 ENCOUNTER — Encounter: Payer: Self-pay | Admitting: Family Medicine

## 2023-04-04 VITALS — BP 126/78 | HR 90 | Temp 98.0°F | Resp 16 | Ht 61.0 in | Wt 232.4 lb

## 2023-04-04 DIAGNOSIS — E559 Vitamin D deficiency, unspecified: Secondary | ICD-10-CM

## 2023-04-04 DIAGNOSIS — J4540 Moderate persistent asthma, uncomplicated: Secondary | ICD-10-CM

## 2023-04-04 DIAGNOSIS — K219 Gastro-esophageal reflux disease without esophagitis: Secondary | ICD-10-CM

## 2023-04-04 DIAGNOSIS — I209 Angina pectoris, unspecified: Secondary | ICD-10-CM

## 2023-04-04 DIAGNOSIS — E785 Hyperlipidemia, unspecified: Secondary | ICD-10-CM

## 2023-04-04 DIAGNOSIS — Z23 Encounter for immunization: Secondary | ICD-10-CM | POA: Diagnosis not present

## 2023-04-04 DIAGNOSIS — G43009 Migraine without aura, not intractable, without status migrainosus: Secondary | ICD-10-CM

## 2023-04-04 DIAGNOSIS — E1169 Type 2 diabetes mellitus with other specified complication: Secondary | ICD-10-CM

## 2023-04-04 DIAGNOSIS — K5904 Chronic idiopathic constipation: Secondary | ICD-10-CM

## 2023-04-04 DIAGNOSIS — D509 Iron deficiency anemia, unspecified: Secondary | ICD-10-CM

## 2023-04-04 DIAGNOSIS — E1159 Type 2 diabetes mellitus with other circulatory complications: Secondary | ICD-10-CM

## 2023-04-04 DIAGNOSIS — F331 Major depressive disorder, recurrent, moderate: Secondary | ICD-10-CM | POA: Diagnosis not present

## 2023-04-04 DIAGNOSIS — I152 Hypertension secondary to endocrine disorders: Secondary | ICD-10-CM

## 2023-04-04 DIAGNOSIS — E538 Deficiency of other specified B group vitamins: Secondary | ICD-10-CM

## 2023-04-04 MED ORDER — OLMESARTAN MEDOXOMIL-HCTZ 40-25 MG PO TABS: 1.0000 | ORAL_TABLET | Freq: Every day | ORAL | 1 refills | Status: DC

## 2023-04-04 MED ORDER — CLONIDINE HCL 0.1 MG PO TABS: 0.1000 mg | ORAL_TABLET | Freq: Every day | ORAL | 1 refills | Status: DC

## 2023-04-04 MED ORDER — MONTELUKAST SODIUM 10 MG PO TABS: 10.0000 mg | ORAL_TABLET | Freq: Every day | ORAL | 1 refills | Status: DC

## 2023-04-04 MED ORDER — AMLODIPINE BESYLATE 2.5 MG PO TABS: 2.5000 mg | ORAL_TABLET | Freq: Every day | ORAL | 1 refills | Status: DC

## 2023-04-04 MED ORDER — ROSUVASTATIN CALCIUM 40 MG PO TABS: ORAL_TABLET | ORAL | 1 refills | Status: DC

## 2023-04-04 MED ORDER — OMEPRAZOLE 40 MG PO CPDR: 40.0000 mg | DELAYED_RELEASE_CAPSULE | Freq: Every day | ORAL | 1 refills | Status: DC

## 2023-04-04 MED ORDER — METOPROLOL SUCCINATE ER 50 MG PO TB24
ORAL_TABLET | ORAL | 1 refills | Status: DC
Start: 2023-04-04 — End: 2023-07-18

## 2023-04-04 MED ORDER — LINACLOTIDE 145 MCG PO CAPS: 145.0000 ug | ORAL_CAPSULE | Freq: Every day | ORAL | 1 refills | Status: DC

## 2023-04-04 MED ORDER — FLUTICASONE PROPIONATE HFA 110 MCG/ACT IN AERO
2.0000 | INHALATION_SPRAY | Freq: Two times a day (BID) | RESPIRATORY_TRACT | 1 refills | Status: DC
Start: 2023-04-04 — End: 2023-04-21

## 2023-04-04 NOTE — Patient Instructions (Signed)
Mirtazepine and Pioglitazone can cause weight gain Discuss options with Dr. Elna Breslow about sleep medication and Dr. Gershon Crane about diabetes medication options

## 2023-04-05 LAB — COMPLETE METABOLIC PANEL WITH GFR
AG Ratio: 1.8 (calc) (ref 1.0–2.5)
ALT: 7 U/L (ref 6–29)
AST: 11 U/L (ref 10–35)
Albumin: 4.4 g/dL (ref 3.6–5.1)
Alkaline phosphatase (APISO): 53 U/L (ref 37–153)
BUN: 12 mg/dL (ref 7–25)
CO2: 30 mmol/L (ref 20–32)
Calcium: 9.6 mg/dL (ref 8.6–10.4)
Chloride: 104 mmol/L (ref 98–110)
Creat: 0.52 mg/dL (ref 0.50–1.05)
Globulin: 2.4 g/dL (calc) (ref 1.9–3.7)
Glucose, Bld: 92 mg/dL (ref 65–99)
Potassium: 3.9 mmol/L (ref 3.5–5.3)
Sodium: 143 mmol/L (ref 135–146)
Total Bilirubin: 0.4 mg/dL (ref 0.2–1.2)
Total Protein: 6.8 g/dL (ref 6.1–8.1)
eGFR: 104 mL/min/{1.73_m2} (ref >=60)

## 2023-04-05 LAB — LIPID PANEL
Cholesterol: 136 mg/dL (ref <200)
HDL: 57 mg/dL (ref >=50)
LDL Cholesterol (Calc): 57 mg/dL
Non-HDL Cholesterol (Calc): 79 mg/dL (calc) (ref <130)
Total CHOL/HDL Ratio: 2.4 (calc) (ref <5.0)
Triglycerides: 136 mg/dL (ref <150)

## 2023-04-05 LAB — VITAMIN D 25 HYDROXY (VIT D DEFICIENCY, FRACTURES): Vit D, 25-Hydroxy: 27 ng/mL — ABNORMAL LOW (ref 30–100)

## 2023-04-05 LAB — MICROALBUMIN / CREATININE URINE RATIO
Creatinine, Urine: 59 mg/dL (ref 20–275)
Microalb Creat Ratio: 20 mg/g{creat} (ref <30)
Microalb, Ur: 1.2 mg/dL

## 2023-04-05 LAB — CBC WITH DIFFERENTIAL/PLATELET
Absolute Monocytes: 631 {cells}/uL (ref 200–950)
Basophils Absolute: 61 {cells}/uL (ref 0–200)
Basophils Relative: 0.8 %
Eosinophils Absolute: 152 {cells}/uL (ref 15–500)
Eosinophils Relative: 2 %
HCT: 36.7 % (ref 35.0–45.0)
Hemoglobin: 11.9 g/dL (ref 11.7–15.5)
Lymphs Abs: 2044 {cells}/uL (ref 850–3900)
MCH: 27.9 pg (ref 27.0–33.0)
MCHC: 32.4 g/dL (ref 32.0–36.0)
MCV: 85.9 fL (ref 80.0–100.0)
MPV: 11 fL (ref 7.5–12.5)
Monocytes Relative: 8.3 %
Neutro Abs: 4712 {cells}/uL (ref 1500–7800)
Neutrophils Relative %: 62 %
Platelets: 276 10*3/uL (ref 140–400)
RBC: 4.27 10*6/uL (ref 3.80–5.10)
RDW: 14.5 % (ref 11.0–15.0)
Total Lymphocyte: 26.9 %
WBC: 7.6 10*3/uL (ref 3.8–10.8)

## 2023-04-05 LAB — IRON,TIBC AND FERRITIN PANEL
%SAT: 17 % (ref 16–45)
Ferritin: 17 ng/mL (ref 16–288)
Iron: 69 ug/dL (ref 45–160)
TIBC: 405 ug/dL (ref 250–450)

## 2023-04-05 LAB — B12 AND FOLATE PANEL
Folate: 10.7 ng/mL
Vitamin B-12: 399 pg/mL (ref 200–1100)

## 2023-04-15 ENCOUNTER — Encounter: Payer: Self-pay | Admitting: Family Medicine

## 2023-04-17 ENCOUNTER — Other Ambulatory Visit: Payer: Self-pay | Admitting: Family Medicine

## 2023-04-17 DIAGNOSIS — J4540 Moderate persistent asthma, uncomplicated: Secondary | ICD-10-CM

## 2023-04-21 ENCOUNTER — Other Ambulatory Visit (HOSPITAL_COMMUNITY): Payer: Self-pay

## 2023-04-21 ENCOUNTER — Other Ambulatory Visit: Payer: Self-pay | Admitting: Nurse Practitioner

## 2023-04-21 ENCOUNTER — Telehealth: Payer: Self-pay | Admitting: Pharmacist

## 2023-04-21 ENCOUNTER — Encounter: Payer: Self-pay | Admitting: Pharmacist

## 2023-04-21 ENCOUNTER — Other Ambulatory Visit: Payer: Self-pay | Admitting: Pharmacist

## 2023-04-21 DIAGNOSIS — J4541 Moderate persistent asthma with (acute) exacerbation: Secondary | ICD-10-CM

## 2023-04-21 MED ORDER — ARNUITY ELLIPTA 100 MCG/ACT IN AEPB
1.0000 | INHALATION_SPRAY | Freq: Every day | RESPIRATORY_TRACT | 0 refills | Status: DC
Start: 2023-04-21 — End: 2023-04-22

## 2023-04-21 NOTE — Progress Notes (Unsigned)
Outreach Note  04/21/2023 Name: Beverly Barrett MRN: [id number] DOB: 1960-12-29  Referred by: Alba Cory, MD Reason for referral : Medication Assistance  Was unable to reach patient via telephone today and have left HIPAA compliant voicemail asking patient to return my call.    Follow Up Plan: Will collaborate with Care Guide to outreach to schedule follow up with me  Estelle Grumbles, PharmD, [facility] [phone]

## 2023-04-21 NOTE — Progress Notes (Unsigned)
04/21/2023  Patient ID: Beverly Barrett, female   DOB: 11-23-1960, 62 y.o.   MRN: [id number]  Receive a referral from PCP for medication assistance. Per message, patient unable to afford latest inhaler prescribed (Flovent HFA) and in the past Qvar did not work for her.  Was unable to reach patient via telephone today and have left HIPAA compliant voicemail asking patient to return my call.   Per review of formulary information from [employer] website, appears that Flovent HFA is a tier 4 (non-preferred) option on patient's plan, while Alvesco, Arnuity Ellipta or Qvar are preferred tier 3 options.  Will collaborate with covering providers regarding therapeutic alternatives preferred through patient's plan.  Estelle Grumbles, PharmD, [facility] [phone]

## 2023-04-21 NOTE — Telephone Encounter (Signed)
This encounter was created in error - please disregard.

## 2023-04-22 ENCOUNTER — Encounter: Payer: Self-pay | Admitting: Pharmacist

## 2023-04-22 ENCOUNTER — Other Ambulatory Visit: Payer: Self-pay | Admitting: Nurse Practitioner

## 2023-04-22 DIAGNOSIS — J4540 Moderate persistent asthma, uncomplicated: Secondary | ICD-10-CM

## 2023-04-22 MED ORDER — ALVESCO 160 MCG/ACT IN AERS
1.0000 | INHALATION_SPRAY | Freq: Two times a day (BID) | RESPIRATORY_TRACT | 0 refills | Status: DC
Start: 2023-04-22 — End: 2023-05-09

## 2023-04-22 NOTE — Patient Instructions (Signed)
I am a pharmacist who works with Dr. Carlynn Purl. She asked me to help with the cost of your inhaler. I reviewed the online formulary for your [employer] prescription plan and different savings options. Looks like the Alvesco inhaler is the most affordable alternative. This prescription has been sent to your [facility] and your cost should be $60/month. Please follow up with [facility] as I believe that they will need to order this inhaler for you.   When you have a chance, please give a call to my scheduler, Amber, at [phone] to schedule a time for [place] to follow up.   If you have medication questions or concerns for me sooner, you can reach me at the number below.   Estelle Grumbles, PharmD, [facility] [phone]

## 2023-04-30 ENCOUNTER — Telehealth: Payer: Self-pay

## 2023-04-30 ENCOUNTER — Encounter: Payer: Self-pay | Admitting: Psychiatry

## 2023-04-30 ENCOUNTER — Ambulatory Visit (INDEPENDENT_AMBULATORY_CARE_PROVIDER_SITE_OTHER): Payer: 59 | Admitting: Psychiatry

## 2023-04-30 VITALS — BP 132/84 | HR 64 | Temp 97.8°F | Ht 61.0 in | Wt 228.2 lb

## 2023-04-30 DIAGNOSIS — G4701 Insomnia due to medical condition: Secondary | ICD-10-CM | POA: Diagnosis not present

## 2023-04-30 DIAGNOSIS — F411 Generalized anxiety disorder: Secondary | ICD-10-CM | POA: Diagnosis not present

## 2023-04-30 DIAGNOSIS — F3342 Major depressive disorder, recurrent, in full remission: Secondary | ICD-10-CM | POA: Diagnosis not present

## 2023-04-30 DIAGNOSIS — Z634 Disappearance and death of family member: Secondary | ICD-10-CM | POA: Diagnosis not present

## 2023-04-30 NOTE — Progress Notes (Unsigned)
BH MD OP Progress Note  04/30/2023 12:05 PM Beverly Barrett  MRN:  [id number]  Chief Complaint:  Chief Complaint  Patient presents with   Follow-up   Depression   Anxiety   Medication Refill   HPI: Beverly Barrett is a 62 year old African-American female, married, lives in [place], has a history of MDD, GAD, chronic pain, arthritis, history of OSA on CPAP, hypothyroidism, migraine headaches was evaluated in office today.  Patient today reports she is currently grieving the loss of her niece as well as there was a 43-year-old child who died in her neighborhood recently by an accidental gunshot.  Patient reports one of her friend's son who was 15 just collapsed and died while playing a sport.  She reports hence it has been a lot of deaths recently.  She reports she is taking it day-by-day and trying to cope.  She is not interested in grief counseling and believes she is okay.  She takes care of her 105-year-old granddaughter during the day, they live in the same home.  That does keep her distracted and busy.  Patient reports overall she has not had any significant depression symptoms.  She is coping with her anxiety better than before.  Patient reports sleep is good.  She takes mirtazapine as well as is on CPAP.  Patient denies side effects to the mirtazapine.  Patient reports appetite is fair.  She reports she tries to exercise regularly by walking around her home.  Patient denies any suicidality, homicidality or perceptual disturbances.  Patient appeared to be alert, oriented to person place time situation.  3 word memory immediate 3 out of 3, after 5 minutes 3 out of 3.  Patient was able to do serial threes, attention and focus seem to be good.  Patient denies any other concerns today.  Visit Diagnosis:    ICD-10-CM   1. MDD (major depressive disorder), recurrent, in full remission (HCC)  F33.42     2. GAD (generalized anxiety disorder)  F41.1     3. Insomnia due to medical  condition  G47.01    mood , insomnia, OSA on CPAP    4. Bereavement  Z63.4       Past Psychiatric History: I have reviewed past psychiatric history from progress note on 01/21/2018.  Past Medical History:  Past Medical History:  Diagnosis Date   Anginal pain (HCC)    none in approx 2 yrs   Anxiety    Asthma    Chronic lower back pain    Common migraine with intractable migraine 09/20/2016   Depression    suicidal ideation   Diabetes mellitus without complication (HCC)    GERD (gastroesophageal reflux disease)    Headache    migraines -3x/wk   Hyperlipidemia    Hypertension    Hypothyroidism    Motion sickness    cars   Multilevel degenerative disc disease    Shortness of breath dyspnea    Sleep apnea    CPAP   Vertigo    Wears dentures    partial upper    Past Surgical History:  Procedure Laterality Date   ABDOMINAL HYSTERECTOMY     bladder tach  1995   CESAREAN SECTION     COLONOSCOPY WITH PROPOFOL N/A 05/29/2015   Procedure: COLONOSCOPY WITH PROPOFOL;  Surgeon: Midge Minium, MD;  Location: [facility];  Service: Endoscopy;  Laterality: N/A;  Latex allergy sleep apnea - no CPAP machine (yet)   COLONOSCOPY WITH PROPOFOL N/A 12/18/2021  Procedure: COLONOSCOPY WITH PROPOFOL;  Surgeon: Toney Reil, MD;  Location: [facility];  Service: Gastroenterology;  Laterality: N/A;   ESOPHAGOGASTRODUODENOSCOPY N/A 04/23/2017   Procedure: ESOPHAGOGASTRODUODENOSCOPY (EGD);  Surgeon: Toney Reil, MD;  Location: [facility];  Service: Gastroenterology;  Laterality: N/A;  Latex allergy sleep apnea   POLYPECTOMY  05/29/2015   Procedure: POLYPECTOMY;  Surgeon: Midge Minium, MD;  Location: [facility];  Service: Endoscopy;;    Family Psychiatric History: I have reviewed family psychiatric history from progress note on 01/21/2018.  Family History:  Family History  Problem Relation Age of Onset   Diabetes Sister    Anxiety disorder Sister     Depression Sister    Hypertension Sister    Cirrhosis Mother    Diabetes Mother    Cirrhosis Father    Breast cancer Sister 51   Heart attack Brother    Alcohol abuse Brother    Drug abuse Brother    Prostate cancer Neg Hx    Kidney cancer Neg Hx    Bladder Cancer Neg Hx     Social History: I have reviewed social history from progress note on 01/21/2018. Social History   Socioeconomic History   Marital status: Married    Spouse name: Training and development officer   Number of children: 3   Years of education: Not on file   Highest education level: GED or equivalent  Occupational History   Not on file  Tobacco Use   Smoking status: Never   Smokeless tobacco: Never  Vaping Use   Vaping status: Never Used  Substance and Sexual Activity   Alcohol use: No    Alcohol/week: 0.0 standard drinks of alcohol   Drug use: No   Sexual activity: Not Currently    Partners: Male  Other Topics Concern   Not on file  Social History Narrative   Lives with husband and one daughter other kids are out of the house   She does not work, husband on disability    Social Determinants of Health   Financial Resource Strain: High Risk (03/06/2020)   Overall Financial Resource Strain (CARDIA)    Difficulty of Paying Living Expenses: Very hard  Food Insecurity: Food Insecurity Present (07/14/2019)   Hunger Vital Sign    Worried About Running Out of Food in the Last Year: Sometimes true    Ran Out of Food in the Last Year: Sometimes true  Transportation Needs: No Transportation Needs (07/14/2019)   PRAPARE - Administrator, Civil Service (Medical): No    Lack of Transportation (Non-Medical): No  Physical Activity: Insufficiently Active (07/14/2019)   Exercise Vital Sign    Days of Exercise per Week: 6 days    Minutes of Exercise per Session: 10 min  Stress: Stress Concern Present (07/14/2019)   [employer] of Occupational Health - Occupational Stress Questionnaire    Feeling of Stress : To some  extent  Social Connections: Moderately Integrated (07/14/2019)   Social Connection and Isolation Panel [NHANES]    Frequency of Communication with Friends and Family: More than three times a week    Frequency of Social Gatherings with Friends and Family: More than three times a week    Attends Religious Services: More than 4 times per year    Active Member of [employer] or Organizations: No    Attends Banker Meetings: Never    Marital Status: Married    Allergies:  Allergies  Allergen Reactions   Latex Swelling  Pt unsure of reaction pt states something happened while in the hospital after surgery.    Metabolic Disorder Labs: Lab Results  Component Value Date   HGBA1C 6.2 01/16/2023   MPG 148 08/06/2021   MPG 162.81 08/28/2019   No results found for: "PROLACTIN" Lab Results  Component Value Date   CHOL 136 04/04/2023   TRIG 136 04/04/2023   HDL 57 04/04/2023   CHOLHDL 2.4 04/04/2023   VLDL 44 (H) 02/12/2017   LDLCALC 57 04/04/2023   LDLCALC 63 04/03/2022   Lab Results  Component Value Date   TSH 0.48 03/04/2022   TSH 0.01 (L) 12/03/2021    Therapeutic Level Labs: No results found for: "LITHIUM" No results found for: "VALPROATE" No results found for: "CBMZ"  Current Medications: Current Outpatient Medications  Medication Sig Dispense Refill   acetaminophen (TYLENOL) 500 MG tablet Take 2 tablets (1,000 mg total) by mouth every 6 (six) hours as needed. 30 tablet 0   albuterol (VENTOLIN HFA) 108 (90 Base) MCG/ACT inhaler INHALE 2 PUFFS BY MOUTH EVERY 6 HOURS AS NEEDED FOR WHEEZING OR SHORTNESS OF BREATH 2 each 0   amLODipine (NORVASC) 2.5 MG tablet Take 1 tablet (2.5 mg total) by mouth daily. 90 tablet 1   aspirin 81 MG EC tablet Take 81 mg by mouth daily.     Aspirin-Acetaminophen-Caffeine (GOODY HEADACHE PO) Take 1 applicator by mouth every other day.     ciclesonide (ALVESCO) 160 MCG/ACT inhaler Inhale 1 puff into the lungs 2 (two) times daily. 1 each  0   cloNIDine (CATAPRES) 0.1 MG tablet Take 1 tablet (0.1 mg total) by mouth at bedtime. 90 tablet 1   estradiol (ESTRACE VAGINAL) 0.1 MG/GM vaginal cream Apply 0.5mg  (pea-sized amount)  just inside the vaginal introitus with a finger-tip on Monday, Wednesday and Friday nights. 30 g 12   fluticasone (FLONASE) 50 MCG/ACT nasal spray Place 2 sprays into both nostrils daily as needed for allergies. 48 mL 0   gabapentin (NEURONTIN) 400 MG capsule TAKE 1 CAPSULE BY MOUTH IN THE MORNING THEN 1 CAPSULE MIDDAY THEN 2 CAPSULES IN THE EVENING 360 capsule 3   Galcanezumab-gnlm (EMGALITY) 120 MG/ML SOAJ Inject 120 mg into the skin every 30 (thirty) days. 1.12 mL 11   glucose blood test strip Use as instructed 100 each 12   ketoconazole (NIZORAL) 2 % cream Apply 1 application. topically daily. 60 g 0   Lancet Devices (ONE TOUCH DELICA LANCING DEV) MISC 1 each by Does not apply route daily. 1 each 0   levothyroxine (SYNTHROID) 112 MCG tablet Take 100 mcg by mouth daily before breakfast.     linaclotide (LINZESS) 145 MCG CAPS capsule Take 1 capsule (145 mcg total) by mouth daily. 90 capsule 1   metFORMIN (GLUCOPHAGE-XR) 750 MG 24 hr tablet Take 1,500 mg by mouth daily with breakfast.     metoCLOPramide (REGLAN) 5 MG tablet Take 1 tablet (5 mg total) by mouth daily before supper. 90 tablet 1   metoprolol succinate (TOPROL-XL) 50 MG 24 hr tablet TAKE 1 TABLET BY MOUTH ONCE A DAY WITH OR IMMEDIATELY FOLLOWING A MEAL. 90 tablet 1   mirtazapine (REMERON) 7.5 MG tablet TAKE 1 TABLET BY MOUTH AT BEDTIME 90 tablet 0   montelukast (SINGULAIR) 10 MG tablet Take 1 tablet (10 mg total) by mouth at bedtime. 90 tablet 1   nitroGLYCERIN (NITROSTAT) 0.4 MG SL tablet Place 1 tablet (0.4 mg total) under the tongue every 5 (five) minutes as needed. 25 tablet  0   olmesartan-hydrochlorothiazide (BENICAR HCT) 40-25 MG tablet Take 1 tablet by mouth daily. 90 tablet 1   omeprazole (PRILOSEC) 40 MG capsule Take 1 capsule (40 mg total) by  mouth daily. 90 capsule 1   OneTouch Delica Lancets 33G MISC 1 each by Does not apply route daily. 100 each 2   pioglitazone (ACTOS) 15 MG tablet Take 15 mg by mouth daily.     rizatriptan (MAXALT) 10 MG tablet May repeat in 2 hours if needed 10 tablet 11   rosuvastatin (CRESTOR) 40 MG tablet TAKE 1 TABLET BY MOUTH ONCE DAILY. IN PLACE OF ATORVASTATIN 90 tablet 1   No current facility-administered medications for this visit.     Musculoskeletal: Strength & Muscle Tone: within normal limits Gait & Station: normal Patient leans: N/A  Psychiatric Specialty Exam: Review of Systems  Psychiatric/Behavioral:  The patient is nervous/anxious.        Grief - improving    Blood pressure 132/84, pulse 64, temperature 97.8 F (36.6 C), temperature source Skin, height 5\' 1"  (1.549 m), weight 228 lb 3.2 oz (103.5 kg).Body mass index is 43.12 kg/m.  General Appearance: Fairly Groomed  Eye Contact:  Fair  Speech:  Normal Rate  Volume:  Normal  Mood:  Anxious and Grief improving  Affect:  Full Range  Thought Process:  Goal Directed and Descriptions of Associations: Intact  Orientation:  Full (Time, Place, and Person)  Thought Content: Logical   Suicidal Thoughts:  No  Homicidal Thoughts:  No  Memory:  Immediate;   Fair Recent;   Fair Remote;   Fair  Judgement:  Fair  Insight:  Fair  Psychomotor Activity:  Normal  Concentration:  Concentration: Fair and Attention Span: Fair  Recall:  [employer] of Knowledge: Fair  Language: Fair  Akathisia:  No  Handed:  Right  AIMS (if indicated): not done  Assets:  Communication Skills Desire for Improvement Housing Social Support Transportation  ADL's:  Intact  Cognition: WNL  Sleep:  Fair   Screenings: AIMS    Flowsheet Row Office Visit from 12/26/2021 in [facility] Office Visit from 10/24/2021 in [facility]  AIMS Total Score 0 0      GAD-7     Flowsheet Row Office Visit from 04/04/2023 in [facility] Most recent reading at 04/04/2023 10:11 AM Office Visit from 12/04/2022 in [facility] Most recent reading at 12/04/2022  2:26 PM Office Visit from 12/04/2022 in [facility] Most recent reading at 12/04/2022 10:58 AM Office Visit from 08/06/2022 in [facility] Most recent reading at 08/06/2022  1:11 PM Office Visit from 04/08/2022 in [facility] Most recent reading at 04/08/2022  1:06 PM  Total GAD-7 Score 16 7 10 6 10       Mini-Mental    Flowsheet Row Office Visit from 12/04/2022 in [facility]  Total Score (max 30 points ) 30      PHQ2-9    Flowsheet Row Office Visit from 04/04/2023 in [facility] Most recent reading at 04/04/2023 10:09 AM Office Visit from 12/04/2022 in [facility] Most recent reading at 12/04/2022  2:26 PM Office Visit from 12/04/2022 in [facility] Most recent reading at 12/04/2022 10:57 AM Office Visit from 08/06/2022 in [facility] Most  recent reading at 08/06/2022  1:10 PM Office Visit from 08/05/2022 in [facility] Most recent reading at 08/05/2022 10:32 AM  PHQ-2 Total Score 4 3 4 2 3   PHQ-9 Total Score 11 5 10 5 5       Flowsheet Row Office Visit from 12/04/2022 in [facility] Office Visit from 08/06/2022 in [facility] Office Visit from 04/08/2022 in [facility]  C-SSRS RISK CATEGORY No Risk No Risk No Risk        Assessment and Plan: Beverly Barrett is a 62 year old African-American female, married, lives in [place], has a history of  MDD, GAD, insomnia, OSA on CPAP, migraine headaches, hypothyroidism was evaluated in office today.  Patient is currently grieving although improving, will benefit from continued medication management, plan as noted below.  Plan MDD in remission Mirtazapine 7.5 mg p.o. nightly  GAD-stable Mirtazapine 7.5 mg p.o. nightly She is also on gabapentin prescribed for pain.  It is also a mood stabilizer.  Insomnia-stable Continue CPAP for OSA Mirtazapine 7.5 mg p.o. nightly  Bereavement-improving Discussed referral for grief counseling, patient declines.  Patient with history of memory problems, recent MMSE-12/04/2022-30 out of 30.  Patient on vitamin B12 replacement.  Will consider repeating MMSE in the future.  I have reviewed and discussed labs-01/09/2023-TSH-low at 0.367.  Patient reports she is currently on a lower dosage of levothyroxine.  She is scheduled to get an ultrasound in December.  Follow-up in clinic in 5 to 6 months or sooner if needed.   Collaboration of Care: Collaboration of Care: Patient refused AEB patient not interested in grief counseling referral as noted above.  Patient/Guardian was advised Release of Information must be obtained prior to any record release in order to collaborate their care with an outside provider. Patient/Guardian was advised if they have not already done so to contact the registration department to sign all necessary forms in order for [place] to release information regarding their care.   Consent: Patient/Guardian gives verbal consent for treatment and assignment of benefits for services provided during this visit. Patient/Guardian expressed understanding and agreed to proceed.   This note was generated in part or whole with voice recognition software. Voice recognition is usually quite accurate but there are transcription errors that can and very often do occur. I apologize for any typographical errors that were not detected and corrected.    Jomarie Longs, MD 04/30/2023, 12:05 PM

## 2023-04-30 NOTE — Progress Notes (Signed)
   Care Guide Note  04/30/2023 Name: Beverly Barrett MRN: [id number] DOB: [birth year]  Referred by: Alba Cory, MD Reason for referral : Care Coordination (Outreach to schedule withPharm d )   Beverly Barrett is a 62 y.o. year old female who is a primary care patient of Alba Cory, MD. Beverly Barrett was referred to the pharmacist for assistance related to  Inhaler cost  .    An unsuccessful telephone outreach was attempted today to contact the patient who was referred to the pharmacy team for assistance with medication assistance. Additional attempts will be made to contact the patient.   Beverly Barrett, RMA Care Guide [facility]  [place], [place] [address] Direct Dial: [phone] [email or web address]

## 2023-05-04 ENCOUNTER — Other Ambulatory Visit: Payer: Self-pay | Admitting: Gastroenterology

## 2023-05-04 ENCOUNTER — Other Ambulatory Visit: Payer: Self-pay | Admitting: Psychiatry

## 2023-05-04 DIAGNOSIS — K219 Gastro-esophageal reflux disease without esophagitis: Secondary | ICD-10-CM

## 2023-05-04 DIAGNOSIS — F411 Generalized anxiety disorder: Secondary | ICD-10-CM

## 2023-05-06 ENCOUNTER — Other Ambulatory Visit: Payer: Self-pay | Admitting: Gastroenterology

## 2023-05-06 ENCOUNTER — Other Ambulatory Visit: Payer: Self-pay | Admitting: Family Medicine

## 2023-05-06 DIAGNOSIS — K219 Gastro-esophageal reflux disease without esophagitis: Secondary | ICD-10-CM

## 2023-05-08 NOTE — Progress Notes (Signed)
   Care Guide Note  05/08/2023 Name: Beverly Barrett MRN: [id number] DOB: [birth year]  Referred by: Alba Cory, MD Reason for referral : Care Coordination (Outreach to schedule withPharm d )   Beverly Barrett is a 62 y.o. year old female who is a primary care patient of Alba Cory, MD. Beverly Barrett was referred to the pharmacist for assistance related to  inhaler cost  .    Successful contact was made with the patient to discuss pharmacy services including being ready for the pharmacist to call at least 5 minutes before the scheduled appointment time, to have medication bottles and any blood sugar or blood pressure readings ready for review. The patient agreed to meet with the pharmacist via with the pharmacist via telephone visit on (date/time).  05/09/2023  Penne Lash, RMA Care Guide [facility]  [place], [place] [address] Direct Dial: [phone] [email or web address]

## 2023-05-09 ENCOUNTER — Other Ambulatory Visit: Payer: Self-pay | Admitting: Family Medicine

## 2023-05-09 ENCOUNTER — Other Ambulatory Visit: Payer: Commercial Managed Care - HMO | Admitting: Pharmacist

## 2023-05-09 ENCOUNTER — Telehealth: Payer: Self-pay | Admitting: Pharmacist

## 2023-05-09 ENCOUNTER — Other Ambulatory Visit: Payer: Self-pay

## 2023-05-09 ENCOUNTER — Other Ambulatory Visit (HOSPITAL_COMMUNITY): Payer: Self-pay

## 2023-05-09 ENCOUNTER — Encounter: Payer: Self-pay | Admitting: Pharmacist

## 2023-05-09 DIAGNOSIS — J4540 Moderate persistent asthma, uncomplicated: Secondary | ICD-10-CM

## 2023-05-09 DIAGNOSIS — E1159 Type 2 diabetes mellitus with other circulatory complications: Secondary | ICD-10-CM

## 2023-05-09 MED ORDER — NITROGLYCERIN 0.4 MG SL SUBL
0.4000 mg | SUBLINGUAL_TABLET | SUBLINGUAL | 0 refills | Status: AC | PRN
Start: 2023-05-09 — End: ?

## 2023-05-09 MED ORDER — ALBUTEROL SULFATE HFA 108 (90 BASE) MCG/ACT IN AERS
INHALATION_SPRAY | RESPIRATORY_TRACT | 0 refills | Status: DC
Start: 2023-05-09 — End: 2023-07-01

## 2023-05-09 NOTE — Progress Notes (Signed)
05/09/2023 Name: Beverly Barrett MRN: [id number] DOB: 1960-08-15  Chief Complaint  Patient presents with   Medication Assistance    Beverly Barrett is a 62 y.o. year old female who presented for a telephone visit.   They were referred to the pharmacist by their PCP for assistance in managing medication access for cost of her inhaler therapy. Note per provider referral, patient unable to afford latest inhaler prescribed (Flovent HFA) and in the past Qvar did not work for her.   Previously was unable to reach patient by telephone, but collaborated with FNP Della Goo (covering for PCP) and [facility] CPhT regarding therapeutic alternatives preferred through patient's plan and found that with manufacturer discount, Alvesco is covered for $60/month for patient.  - MyChart message sent to patient to communicate this information  Today reach patient by telephone.   Subjective:  Care Team: Primary Care Provider: Alba Cory, MD ; Next Scheduled Visit: 07/17/2023 GI Specialist: Toney Reil, MD; Next Scheduled Visit: 07/02/2023 Neurologist: Glean Salvo, NP; Next Schedule Visit: 07/08/2023 Endocrinologist: Erlene Quan, MD; Next Scheduled Visit: 07/17/2023 Psychiatrist: Jomarie Longs, MD   Medication Access/Adherence  Current Pharmacy:  [facility] [address] ([address]), [place] - [address] [place]) [place] [address] Phone: [phone] Fax: [phone]  [facility] - [place], [place] - [address] [place] [address] Phone: [phone] Fax: [phone]   Patient reports affordability concerns with their medications: Yes  Patient reports access/transportation concerns to their pharmacy: No  Patient reports adherence concerns with their medications:  No    Today patient reports $60/month for Alvesco inhaler is unaffordable.   Reports has also been  unable to pick up her Linzess prescription due to cost  Reports uses weekly pillbox to organize her medications   Asthma:  Current medications:  - montelukast 10 mg nightly - Reports has not been using her maintenance or albuterol inhaler as these have been unaffordable for her  Medications tried in the past: Flovent HFA (cost); Qvar (did not work well for patient)  Reports noticed increased shortness of breath since has been out of her Flovent inhaler   Hypertension:  Current medications:  - amlodipine 2.5 mg daily - clonidine 0.1 mg nightly at bedtime - metoprolol ER 50 mg daily - olmesartan-HCTZ 40-25 mg daily  Current blood pressure readings readings: last checked yesterday, recalls reading ~130/85   Objective:  Lab Results  Component Value Date   HGBA1C 6.2 01/16/2023    Lab Results  Component Value Date   CREATININE 0.52 04/04/2023   BUN 12 04/04/2023   NA 143 04/04/2023   K 3.9 04/04/2023   CL 104 04/04/2023   CO2 30 04/04/2023    Lab Results  Component Value Date   CHOL 136 04/04/2023   HDL 57 04/04/2023   LDLCALC 57 04/04/2023   TRIG 136 04/04/2023   CHOLHDL 2.4 04/04/2023   BP Readings from Last 3 Encounters:  04/30/23 132/84  04/04/23 126/78  12/17/22 132/78   Pulse Readings from Last 3 Encounters:  04/30/23 64  04/04/23 90  12/17/22 78     Medications Reviewed Today     Reviewed by Manuela Neptune, RPH-CPP (Pharmacist) on 05/09/23 at 1212  Med List Status: <None>   Medication Order Taking? Sig Documenting Provider Last Dose Status Informant  acetaminophen (TYLENOL) 500 MG tablet [id number] Yes Take 2 tablets (1,000 mg total) by mouth every 6 (  six) hours as needed. Franne Forts, PA-C Taking Active   albuterol (VENTOLIN HFA) 108 (90 Base) MCG/ACT inhaler [id number]  INHALE 2 PUFFS BY MOUTH EVERY 6 HOURS AS NEEDED FOR WHEEZING OR SHORTNESS OF Ramond Dial, Danna Hefty, MD  Active   amLODipine (NORVASC) 2.5 MG tablet [id number] Yes Take  1 tablet (2.5 mg total) by mouth daily. Alba Cory, MD Taking Active   aspirin 81 MG EC tablet [id number] Yes Take 81 mg by mouth daily. [provider] Taking Active Self  Aspirin-Acetaminophen-Caffeine (GOODY HEADACHE PO) [id number] No Take 1 applicator by mouth every other day.  Patient not taking: Reported on 05/09/2023   [provider] Not Taking Active   ciclesonide (ALVESCO) 160 MCG/ACT inhaler [id number]  Inhale 1 puff into the lungs 2 (two) times daily. Berniece Salines, FNP  Active   cloNIDine (CATAPRES) 0.1 MG tablet [id number] Yes Take 1 tablet (0.1 mg total) by mouth at bedtime. Alba Cory, MD Taking Active   estradiol [facility] VAGINAL) 0.1 MG/GM vaginal cream [id number] Yes Apply 0.5mg  (pea-sized amount)  just inside the vaginal introitus with a finger-tip on Monday, Wednesday and Friday nights. Harle Battiest, PA-C Taking Active            Med Note Estelle Grumbles A   Fri May 09, 2023 12:03 PM) Uses as needed  fluticasone [facility]) 50 MCG/ACT nasal spray [id number] Yes Place 2 sprays into both nostrils daily as needed for allergies. Alba Cory, MD Taking Active   gabapentin (NEURONTIN) 400 MG capsule [id number] Yes TAKE 1 CAPSULE BY MOUTH IN THE MORNING THEN 1 CAPSULE MIDDAY THEN 2 CAPSULES IN THE Alcus Dad, NP Taking Active   Galcanezumab-gnlm [facility]) 120 MG/ML Ivory Broad [id number] Yes Inject 120 mg into the skin every 30 (thirty) days. Glean Salvo, NP Taking Active   glucose blood test strip [id number]  Use as instructed Alba Cory, MD  Active   Lancet Devices (ONE [facility] DELICA LANCING DEV) MISC [id number]  1 each by Does not apply route daily. Alba Cory, MD  Active   levothyroxine (SYNTHROID) 112 MCG tablet [id number]  Take 100 mcg by mouth daily before breakfast. Sherlon Handing, MD  Active   linaclotide [facility]) 145 MCG CAPS capsule [id number]  Take 1 capsule (145 mcg total) by mouth daily. Alba Cory, MD  Active    metFORMIN (GLUCOPHAGE-XR) 750 MG 24 hr tablet [id number] Yes TAKE 2 TABLETS BY MOUTH ONCE DAILY WITH Iona Coach, Danna Hefty, MD Taking Active   metoCLOPramide (REGLAN) 5 MG tablet [id number] Yes Take 1 tablet (5 mg total) by mouth daily before supper. Alba Cory, MD Taking Active            Med Note Ronney Asters, [facility] A   Fri May 09, 2023 12:06 PM) Uses as needed  metoprolol succinate (TOPROL-XL) 50 MG 24 hr tablet [id number] Yes TAKE 1 TABLET BY MOUTH ONCE A DAY WITH OR IMMEDIATELY FOLLOWING A MEAL. Alba Cory, MD Taking Active   mirtazapine (REMERON) 7.5 MG tablet [id number] Yes TAKE 1 TABLET BY MOUTH AT BEDTIME Eappen, Levin Bacon, MD Taking Active   montelukast (SINGULAIR) 10 MG tablet [id number] Yes Take 1 tablet (10 mg total) by mouth at bedtime. Alba Cory, MD Taking Active   nitroGLYCERIN (NITROSTAT) 0.4 MG SL tablet [id number]  Place 1 tablet (0.4 mg total) under the tongue every 5 (five) minutes as needed. Antonieta Iba, MD  Active Self           Med Note Katrinka Blazing, [place]  L   Mon May 01, 2020 10:22 AM)    olmesartan-hydrochlorothiazide (BENICAR HCT) 40-25 MG tablet [id number] Yes Take 1 tablet by mouth daily. Alba Cory, MD Taking Active   omeprazole (PRILOSEC) 40 MG capsule [id number] Yes Take 1 capsule (40 mg total) by mouth daily. Alba Cory, MD Taking Active   OneTouch Delica Lancets 33G [place] [id number]  1 each by Does not apply route daily. Alba Cory, MD  Active   pioglitazone (ACTOS) 15 MG tablet [id number] Yes Take 1 tablet by mouth once daily Alba Cory, MD Taking Active   rizatriptan (MAXALT) 10 MG tablet [id number] Yes May repeat in 2 hours if needed Glean Salvo, NP Taking Active   rosuvastatin (CRESTOR) 40 MG tablet [id number] Yes TAKE 1 TABLET BY MOUTH ONCE DAILY. IN PLACE OF ATORVASTATIN Alba Cory, MD Taking Active   Med List Note Dellie Burns 09/05/21 1150): bruin              Assessment/Plan:   Encourage patient to  schedule follow up appointment with Cardiologist. Phone number for office provided  Through speaking with patient, find that she may have active [place] prescription coverage that is not yet on file with her pharmacy Collaborate with [place] CPhT Ayla who locates patient's [place] coverage and confirms currently active Follow up with [facility] to provide billing information for her [place] Medicaid coverage Pharmacy technician Betsy Coder confirm prescriptions for patient's Arnuity Ellipta and Linzess go through both plans for patient and she will have a $4 copayment for each Follow up with patient to provide this update  Comprehensive medication review performed; medication list updated in electronic medical record - Caution patient for risk of dizziness/sedation with gabapentin  Encourage patient to continue to use weekly pillbox as adherence tool  Asthma: - Currently uncontrolled.  - Find that patient has not been using either her maintenance inhaler or her albuterol rescue inhaler as neither have been affordable for her.  Reports cost of Arnuity now affordable with her Medicaid coverage - Identify patient in need of renewal of her Ventolin (albuterol) inhaler as latest prescription is expired  Collaborate with PCP/clinical team to request renewal be sent to pharmacy for patient   Renewal sent to pharmacy for patient Follow up with [facility] and confirm pharmacy was able to process through patient's Medicaid for Ventolin (Medicaid preferred option) - Reviewed appropriate inhaler technique, send patient MyChart message with education on using Ellipta inhaler and also advise patient to have [facility] provide counseling when she picks up from pharmacy   Hypertension: - Reviewed appropriate blood pressure monitoring technique and reviewed goal blood pressure. Recommended to check home blood pressure and heart rate, keep log of results and have this record to review at  upcoming medical appointments. Patient to contact provider office sooner if needed for readings outside of established parameters or symptoms   Follow Up Plan: Clinical Pharmacist will follow up with patient by telephone on 06/18/2023 at 11:30 AM   Estelle Grumbles, PharmD, [facility] [phone]

## 2023-05-09 NOTE — Patient Instructions (Signed)
Goals Addressed             This Visit's Progress    Pharmacy Goals       Please follow up with your pharmacy regarding picking up your medications.   Your Arnuity Ellipta inhaler copayment should be $4 per month   Whenever you pick up medications from your pharmacy, please be sure to ask your pharmacy to split bill your prescriptions through BOTH your [employer] plan AND your Medicaid plan   Please be sure to use your Arnuity Ellipta inhaler everyday to control your symptoms. Please remember to rinse your mouth thoroughly with water and spit out after each use.   Please ask the pharmacist to show you how to use this inhaler when you pick up the medicine. You can also copy and paste the following web address into your internet browser for a video on how to use the Ellipta inhaler device:   [email or web address]   Please let me know if your have any further medication questions or concerns!   Estelle Grumbles, PharmD, [facility] [phone]

## 2023-05-09 NOTE — Progress Notes (Signed)
Find patient in need of refill of Ventolin HFA inhaler and Nitrostat, but both prescriptions expired. Would you please consider sending renewals for both to her [facility]?  Thank you!  Gentry Fitz

## 2023-05-10 ENCOUNTER — Other Ambulatory Visit: Payer: Self-pay | Admitting: Family Medicine

## 2023-05-10 DIAGNOSIS — E1169 Type 2 diabetes mellitus with other specified complication: Secondary | ICD-10-CM

## 2023-05-11 ENCOUNTER — Other Ambulatory Visit: Payer: Self-pay | Admitting: Gastroenterology

## 2023-05-11 DIAGNOSIS — K219 Gastro-esophageal reflux disease without esophagitis: Secondary | ICD-10-CM

## 2023-05-22 ENCOUNTER — Other Ambulatory Visit: Payer: Self-pay | Admitting: Family Medicine

## 2023-05-22 DIAGNOSIS — E785 Hyperlipidemia, unspecified: Secondary | ICD-10-CM

## 2023-06-03 ENCOUNTER — Other Ambulatory Visit: Payer: Self-pay | Admitting: Nurse Practitioner

## 2023-06-04 NOTE — Telephone Encounter (Signed)
Requested medication (s) are due for refill today: yes  Requested medication (s) are on the active medication list: yes  Last refill:  05/09/23  Future visit scheduled: yes  Notes to clinic:  Unable to refill per protocol, last refill by historical provider.      Requested Prescriptions  Pending Prescriptions Disp Refills   ARNUITY ELLIPTA 100 MCG/ACT AEPB [Pharmacy Med Name: Arnuity Ellipta 100 MCG/ACT Inhalation Aerosol Powder Breath Activated] 30 each 0    Sig: Inhale 1 puff by mouth once daily     Pulmonology:  Corticosteroids Passed - 06/03/2023 12:17 PM      Passed - Valid encounter within last 12 months    Recent Outpatient Visits           2 months ago Dyslipidemia associated with type 2 diabetes mellitus [facility])   [place] [facility] [place], Danna Hefty, MD   6 months ago Dyslipidemia associated with type 2 diabetes mellitus [facility])   [place] [facility] Alba Cory, MD   10 months ago Hypertension associated with type 2 diabetes mellitus [facility])   [facility] Alba Cory, MD   1 year ago Viral upper respiratory tract infection   [facility] Della Goo F, FNP   1 year ago Dyslipidemia associated with type 2 diabetes mellitus [facility])   [place] [facility] Alba Cory, MD       Future Appointments             In 4 weeks Vanga, Loel Dubonnet, MD [facility] [place]   In 1 month Alba Cory, MD [facility]

## 2023-06-18 ENCOUNTER — Other Ambulatory Visit: Payer: Self-pay | Admitting: Pharmacist

## 2023-06-18 NOTE — Progress Notes (Signed)
06/18/2023 Name: Beverly Barrett MRN: [id number] DOB: August 27, 1960  Chief Complaint  Patient presents with   Medication Assistance    Beverly Barrett is a 62 y.o. year old female who presented for a telephone visit.   They were referred to the pharmacist by their PCP for assistance in managing medication access.    Subjective:  Care Team: Primary Care Provider: Alba Cory, MD ; Next Scheduled Visit: 07/17/2023 GI Specialist: Beverly Reil, MD; Next Scheduled Visit: 07/02/2023 Neurologist: Beverly Salvo, NP; Next Schedule Visit: 07/08/2023 Endocrinologist: Beverly Quan, MD; Next Scheduled Visit: 07/17/2023 Psychiatrist: Jomarie Longs, MD   Medication Access/Adherence  Current Pharmacy:  [facility] [address]), [place] - [address] [place] ([place]) [place] [address] Phone: [phone] Fax: [phone]  [facility] - [place], [place] - [address] [place] [address] Phone: [phone] Fax: [phone]   Patient reports affordability concerns with their medications: Yes  Patient reports access/transportation concerns to their pharmacy: No  Patient reports adherence concerns with their medications:  No     Today patient reports was unable to pick up refill of her Arnuity Ellipta inhaler as was told cost was >$100 and unable to pick up refill of her rosuvastatin as as told refill request was denied - From review of chart, note new prescription for rosuvastatin sent to pharmacy for patient on 04/04/2023 for 90 day supply + 1 refill  Reports uses weekly pillbox to organize her medications; denies missed doses   Reports has had a stomach virus with vomiting/upset stomach since Monday. Reports feels like she is getting better and no vomiting today.  - Reports has been staying well hydrated - Denies need to contact her PCP at this time   Asthma:    Current medications:  - montelukast 10 mg nightly - Arnuity Ellipta 100 mcg/act - 1 puff daily  Confirms rinsing mouth and spitting out after each use - Albuterol HFA - 2 puffs every 6 hours as needed for wheezing or shortness of breath   Medications tried in the past: Flovent HFA (cost); Qvar (did not work well for patient)   Reports improvement in her breathing since started using Arnuity. Denies needing albuterol inhaler recently     Hypertension:   Current medications:  - amlodipine 2.5 mg daily - clonidine 0.1 mg nightly at bedtime - metoprolol ER 50 mg daily - olmesartan-HCTZ 40-25 mg daily   Current blood pressure readings readings: last checked yesterday, reading: 128/84     Objective:   Lab Results  Component Value Date   CREATININE 0.52 04/04/2023   BUN 12 04/04/2023   NA 143 04/04/2023   K 3.9 04/04/2023   CL 104 04/04/2023   CO2 30 04/04/2023    Lab Results  Component Value Date   CHOL 136 04/04/2023   HDL 57 04/04/2023   LDLCALC 57 04/04/2023   TRIG 136 04/04/2023   CHOLHDL 2.4 04/04/2023   BP Readings from Last 3 Encounters:  04/30/23 132/84  04/04/23 126/78  12/17/22 132/78   Pulse Readings from Last 3 Encounters:  04/30/23 64  04/04/23 90  12/17/22 78    Current Outpatient Medications on File Prior to Visit  Medication Sig Dispense Refill   ARNUITY ELLIPTA 100 MCG/ACT AEPB Inhale 1 puff by mouth once daily 30 each 0   acetaminophen (TYLENOL) 500 MG tablet Take 2 tablets (1,000 mg total) by mouth every 6 (six)  hours as needed. 30 tablet 0   albuterol (VENTOLIN HFA) 108 (90 Base) MCG/ACT inhaler INHALE 2 PUFFS BY MOUTH EVERY 6 HOURS AS NEEDED FOR WHEEZING OR SHORTNESS OF BREATH 2 each 0   amLODipine (NORVASC) 2.5 MG tablet Take 1 tablet (2.5 mg total) by mouth daily. 90 tablet 1   aspirin 81 MG EC tablet Take 81 mg by mouth daily.     Aspirin-Acetaminophen-Caffeine (GOODY HEADACHE PO) Take 1 applicator by mouth every other day. (Patient  not taking: Reported on 05/09/2023)     cloNIDine (CATAPRES) 0.1 MG tablet Take 1 tablet (0.1 mg total) by mouth at bedtime. 90 tablet 1   estradiol (ESTRACE VAGINAL) 0.1 MG/GM vaginal cream Apply 0.5mg  (pea-sized amount)  just inside the vaginal introitus with a finger-tip on Monday, Wednesday and Friday nights. 30 g 12   fluticasone (FLONASE) 50 MCG/ACT nasal spray Place 2 sprays into both nostrils daily as needed for allergies. 48 mL 0   gabapentin (NEURONTIN) 400 MG capsule TAKE 1 CAPSULE BY MOUTH IN THE MORNING THEN 1 CAPSULE MIDDAY THEN 2 CAPSULES IN THE EVENING 360 capsule 3   Galcanezumab-gnlm (EMGALITY) 120 MG/ML SOAJ Inject 120 mg into the skin every 30 (thirty) days. 1.12 mL 11   glucose blood test strip Use as instructed 100 each 12   Lancet Devices (ONE TOUCH DELICA LANCING DEV) MISC 1 each by Does not apply route daily. 1 each 0   levothyroxine (SYNTHROID) 100 MCG tablet Take 100 mcg by mouth every morning.     linaclotide (LINZESS) 145 MCG CAPS capsule Take 1 capsule (145 mcg total) by mouth daily. 90 capsule 1   metFORMIN (GLUCOPHAGE-XR) 750 MG 24 hr tablet TAKE 2 TABLETS BY MOUTH ONCE DAILY WITH BREAKFAST 180 tablet 0   metoCLOPramide (REGLAN) 5 MG tablet Take 1 tablet (5 mg total) by mouth daily before supper. 90 tablet 1   metoprolol succinate (TOPROL-XL) 50 MG 24 hr tablet TAKE 1 TABLET BY MOUTH ONCE A DAY WITH OR IMMEDIATELY FOLLOWING A MEAL. 90 tablet 1   mirtazapine (REMERON) 7.5 MG tablet TAKE 1 TABLET BY MOUTH AT BEDTIME 90 tablet 0   montelukast (SINGULAIR) 10 MG tablet Take 1 tablet (10 mg total) by mouth at bedtime. 90 tablet 1   nitroGLYCERIN (NITROSTAT) 0.4 MG SL tablet Place 1 tablet (0.4 mg total) under the tongue every 5 (five) minutes as needed. 25 tablet 0   olmesartan-hydrochlorothiazide (BENICAR HCT) 40-25 MG tablet Take 1 tablet by mouth daily. 90 tablet 1   omeprazole (PRILOSEC) 40 MG capsule Take 1 capsule by mouth once daily 90 capsule 0   OneTouch Delica  Lancets 33G MISC 1 each by Does not apply route daily. 100 each 2   pioglitazone (ACTOS) 15 MG tablet Take 1 tablet by mouth once daily 90 tablet 0   rizatriptan (MAXALT) 10 MG tablet May repeat in 2 hours if needed 10 tablet 11   rosuvastatin (CRESTOR) 40 MG tablet TAKE 1 TABLET BY MOUTH ONCE DAILY. IN PLACE OF ATORVASTATIN 90 tablet 1   No current facility-administered medications on file prior to visit.       Assessment/Plan:   Advise patient to follow up with PCP regarding recent stomach symptoms if not continuing to improve or for any new symptoms  Outreach to [employer] today on behalf of patient. Request pharmacy split bill patient's Arnuity inhaler through both her [employer] plan and her Medicaid and fill rosuvastatin prescription for patient - Confirms cost is $4 copay (  after Medicaid) - Also request pharmacy fill patient's prescription for rosuvastatin from hold  Reiterate with patient the importance of asking her pharmacy to split bill her prescription through BOTH her [employer]ign[employer] plan and her Medicaid plan every time  Ask patient to contact clinical pharmacist or office if needed for support if having difficulty with obtaining any of her medications from her pharmacy  Again encourage patient to schedule follow up appointment with Cardiologist     Encourage patient to continue to use weekly pillbox as adherence tool   Asthma: - Reviewed appropriate inhaler technique. Encourage patient to continue to use maintenance inhaler consistently     Hypertension: - Identify patient in need of refills of her amlodipine, clonidine, metoprolol ER 50 mg and olmesartan-HCTZ. States that she will contact pharmacy to have these refilled - Reviewed appropriate blood pressure monitoring technique and reviewed goal blood pressure. Recommended to check home blood pressure and heart rate, keep log of results and have this record to review at upcoming medical appointments. Patient to contact  provider office sooner if needed for readings outside of established parameters or symptoms     Follow Up Plan: Clinical Pharmacist will follow up with patient by telephone on 07/09/2023 at 3:00 PM    Estelle Grumbles, PharmD, [facility] [phone]

## 2023-06-20 NOTE — Patient Instructions (Signed)
Goals Addressed             This Visit's Progress    Pharmacy Goals       Please follow up with your pharmacy regarding picking up your medications.   Your Arnuity Ellipta inhaler copayment should be $4 per month   Whenever you pick up medications from your pharmacy, please be sure to ask your pharmacy to split bill your prescriptions through BOTH your [employer] plan AND your Medicaid plan   Please be sure to use your Arnuity Ellipta inhaler everyday to control your symptoms. Please remember to rinse your mouth thoroughly with water and spit out after each use.   Please let me know if your have any further medication questions or concerns!   Estelle Grumbles, PharmD, [facility] [phone]

## 2023-07-01 ENCOUNTER — Other Ambulatory Visit (HOSPITAL_COMMUNITY): Payer: Self-pay

## 2023-07-01 ENCOUNTER — Other Ambulatory Visit: Payer: Self-pay | Admitting: Family Medicine

## 2023-07-01 DIAGNOSIS — J4540 Moderate persistent asthma, uncomplicated: Secondary | ICD-10-CM

## 2023-07-02 ENCOUNTER — Encounter: Payer: Self-pay | Admitting: Gastroenterology

## 2023-07-02 ENCOUNTER — Other Ambulatory Visit: Payer: Self-pay

## 2023-07-02 ENCOUNTER — Ambulatory Visit (INDEPENDENT_AMBULATORY_CARE_PROVIDER_SITE_OTHER): Payer: Commercial Managed Care - HMO | Admitting: Gastroenterology

## 2023-07-02 VITALS — BP 146/87 | HR 76 | Temp 97.9°F | Ht 61.0 in | Wt 234.0 lb

## 2023-07-02 DIAGNOSIS — R1013 Epigastric pain: Secondary | ICD-10-CM

## 2023-07-02 DIAGNOSIS — R110 Nausea: Secondary | ICD-10-CM | POA: Diagnosis not present

## 2023-07-02 DIAGNOSIS — R109 Unspecified abdominal pain: Secondary | ICD-10-CM | POA: Diagnosis not present

## 2023-07-02 DIAGNOSIS — D509 Iron deficiency anemia, unspecified: Secondary | ICD-10-CM

## 2023-07-02 DIAGNOSIS — K5909 Other constipation: Secondary | ICD-10-CM | POA: Diagnosis not present

## 2023-07-02 MED ORDER — NA SULFATE-K SULFATE-MG SULF 17.5-3.13-1.6 GM/177ML PO SOLN
354.0000 mL | Freq: Once | ORAL | 0 refills | Status: AC
Start: 2023-07-02 — End: 2023-07-02

## 2023-07-02 NOTE — Patient Instructions (Signed)
I got Your HIDA scan schedule for you 07/14/2023 arrive to medical mall at [facility] 8:00am for a 8:30am scan. Nothing to eat or drink after midnight.   If you need to reschedule please call [phone] option 3 and 2.  I gave you fusion Plus samples take 1 tablet every other day. Let us know how it works for you and we can call it in to your pharmacy.

## 2023-07-02 NOTE — Progress Notes (Signed)
Beverly Barrett [address]  [address]  [place], [place] [address]  Main: [phone]  Fax: [phone]    Gastroenterology Consultation  Referring Provider:     Alba Cory, Barrett Primary Care Physician:  Beverly Cory, Barrett Primary Gastroenterologist:  Dr. Arlyss Barrett Reason for Consultation:     Chronic constipation, GERD, chronic nausea        HPI:   Beverly Barrett is a 62 y.o. female referred by Dr. Alba Cory, Barrett  for consultation & management of chronic constipation and GERD.  Patient reports that she has been experiencing severe constipation, having hard bowel movement about once a week associated with significant straining.  Scant blood on wiping.  She lost about 10 pounds intentionally.  She has been experiencing epigastric pain associated with nausea.  Patient is taking MiraLAX with no relief.  Labs revealed no evidence of anemia.  Follow-up visit 06/27/2021 Patient is here for follow-up of constipation and GERD.  She tried Linzess 145 MCG samples which helped, currently on prescription.  Reports having bowel movements daily.  She is concerned about nausea occurring on a daily basis not associated with vomiting.  She takes omeprazole 40 mg daily for chronic GERD.  Patient is gaining weight.  Patient is accompanied by her daughter today.  Follow-up visit 10/22/2021 Patient is here for follow-up of constipation and upper abdominal pain.  She ran out of Linzess refills due to change in her pharmacy coverage.  She is taking MiraLAX but reports irregular bowel habits associated with significant straining, abdominal bloating.  She also reports upper abdominal discomfort.  She is taking omeprazole 40 mg daily.  Follow-up visit 05/06/2022 Patient is here for follow-up of new onset of nausea and early satiety.  Her symptoms started about a week ago, she started feeling nauseous when she wakes up in the morning, and reports feeling full before finishing her meal,  drinks baking soda which helps to finish her meal.  She continues to take omeprazole 40 mg daily before breakfast.  Her weight has been stable.  She reports that the Linzess has been helping and moves her bowels regularly.  She denies any abdominal pain or back pain.  Her hemoglobin A1c has been stable and gradually improving.  She denies any change in her diabetes medications.  Follow-up visit 07/02/2023 Beverly Barrett is here for follow-up of chronic symptoms of nausea, worse postprandial and she also reports tightness in her central abdomen more towards the left flank area.  She denies any right upper quadrant discomfort.  She reports eating small meal at a time only.  Her diabetes is fairly under control.  She continues to take omeprazole 40 mg once a day.  She does have mild chronic iron deficiency with low normal hemoglobin.  NSAIDs: None  Antiplts/Anticoagulants/Anti thrombotics: None  Patient does not smoke or drink alcohol.  She denies family history of GU malignancy  GI Procedures:  Upper endoscopy 04/23/2017 - Normal duodenal bulb and second portion of the duodenum. - A single gastric polyp. Resected and retrieved. - Erythematous mucosa in the gastric body. Biopsied. - Normal gastroesophageal junction and esophagus.    Colonoscopy 05/29/2015 - Two 2 to 3 mm polyps in the sigmoid colon. Resected and retrieved. - Diverticulosis in the entire examined colon. - Non-bleeding internal hemorrhoids. Colon, polyp(s), sigmoid - SMALL AMOUNTS OF FRAGMENTED SUPERFICIAL BENIGN COLONIC EPITHELIUM. - NO INTACT COLONIC MUCOSA OR POLYPOID ARCHITECTURE IDENTIFIED. - NO ADENOMATOUS CHANGE OR MALIGNANCY IDENTIFIED.  Past  Medical History:  Diagnosis Date   Anginal pain (HCC)    none in approx 2 yrs   Anxiety    Asthma    Chronic lower back pain    Common migraine with intractable migraine 09/20/2016   Depression    suicidal ideation   Diabetes mellitus without complication (HCC)    GERD  (gastroesophageal reflux disease)    Headache    migraines -3x/wk   Hyperlipidemia    Hypertension    Hypothyroidism    Motion sickness    cars   Multilevel degenerative disc disease    Shortness of breath dyspnea    Sleep apnea    CPAP   Vertigo    Wears dentures    partial upper    Past Surgical History:  Procedure Laterality Date   ABDOMINAL HYSTERECTOMY     bladder tach  1995   CESAREAN SECTION     COLONOSCOPY WITH PROPOFOL N/A 05/29/2015   Procedure: COLONOSCOPY WITH PROPOFOL;  Surgeon: Midge Minium, Barrett;  Location: [facility];  Service: Endoscopy;  Laterality: N/A;  Latex allergy sleep apnea - no CPAP machine (yet)   COLONOSCOPY WITH PROPOFOL N/A 12/18/2021   Procedure: COLONOSCOPY WITH PROPOFOL;  Surgeon: Toney Reil, Barrett;  Location: [facility];  Service: Gastroenterology;  Laterality: N/A;   ESOPHAGOGASTRODUODENOSCOPY N/A 04/23/2017   Procedure: ESOPHAGOGASTRODUODENOSCOPY (EGD);  Surgeon: Toney Reil, Barrett;  Location: [facility];  Service: Gastroenterology;  Laterality: N/A;  Latex allergy sleep apnea   POLYPECTOMY  05/29/2015   Procedure: POLYPECTOMY;  Surgeon: Midge Minium, Barrett;  Location: [facility];  Service: Endoscopy;;   Current Outpatient Medications:    acetaminophen (TYLENOL) 500 MG tablet, Take 2 tablets (1,000 mg total) by mouth every 6 (six) hours as needed., Disp: 30 tablet, Rfl: 0   amLODipine (NORVASC) 2.5 MG tablet, Take 1 tablet (2.5 mg total) by mouth daily., Disp: 90 tablet, Rfl: 1   ARNUITY ELLIPTA 100 MCG/ACT AEPB, Inhale 1 puff by mouth once daily, Disp: 30 each, Rfl: 0   aspirin 81 MG EC tablet, Take 81 mg by mouth daily., Disp: , Rfl:    Aspirin-Acetaminophen-Caffeine (GOODY HEADACHE PO), Take 1 applicator by mouth every other day., Disp: , Rfl:    cloNIDine (CATAPRES) 0.1 MG tablet, Take 1 tablet (0.1 mg total) by mouth at bedtime., Disp: 90 tablet, Rfl: 1   estradiol (ESTRACE VAGINAL) 0.1 MG/GM vaginal  cream, Apply 0.5mg  (pea-sized amount)  just inside the vaginal introitus with a finger-tip on Monday, Wednesday and Friday nights., Disp: 30 g, Rfl: 12   fluticasone (FLONASE) 50 MCG/ACT nasal spray, Place 2 sprays into both nostrils daily as needed for allergies., Disp: 48 mL, Rfl: 0   gabapentin (NEURONTIN) 400 MG capsule, TAKE 1 CAPSULE BY MOUTH IN THE MORNING THEN 1 CAPSULE MIDDAY THEN 2 CAPSULES IN THE EVENING, Disp: 360 capsule, Rfl: 3   Galcanezumab-gnlm (EMGALITY) 120 MG/ML SOAJ, Inject 120 mg into the skin every 30 (thirty) days., Disp: 1.12 mL, Rfl: 11   glucose blood test strip, Use as instructed, Disp: 100 each, Rfl: 12   Lancet Devices (ONE TOUCH DELICA LANCING DEV) MISC, 1 each by Does not apply route daily., Disp: 1 each, Rfl: 0   levothyroxine (SYNTHROID) 100 MCG tablet, Take 100 mcg by mouth every morning., Disp: , Rfl:    linaclotide (LINZESS) 145 MCG CAPS capsule, Take 1 capsule (145 mcg total) by mouth daily., Disp: 90 capsule, Rfl: 1   metFORMIN (GLUCOPHAGE-XR) 750 MG  24 hr tablet, TAKE 2 TABLETS BY MOUTH ONCE DAILY WITH BREAKFAST, Disp: 180 tablet, Rfl: 0   metoCLOPramide (REGLAN) 5 MG tablet, Take 1 tablet (5 mg total) by mouth daily before supper., Disp: 90 tablet, Rfl: 1   metoprolol succinate (TOPROL-XL) 50 MG 24 hr tablet, TAKE 1 TABLET BY MOUTH ONCE A DAY WITH OR IMMEDIATELY FOLLOWING A MEAL., Disp: 90 tablet, Rfl: 1   mirtazapine (REMERON) 7.5 MG tablet, TAKE 1 TABLET BY MOUTH AT BEDTIME, Disp: 90 tablet, Rfl: 0   montelukast (SINGULAIR) 10 MG tablet, Take 1 tablet (10 mg total) by mouth at bedtime., Disp: 90 tablet, Rfl: 1   Na Sulfate-K Sulfate-Mg Sulf 17.5-3.13-1.6 GM/177ML SOLN, Take 354 mLs by mouth once for 1 dose., Disp: 354 mL, Rfl: 0   nitroGLYCERIN (NITROSTAT) 0.4 MG SL tablet, Place 1 tablet (0.4 mg total) under the tongue every 5 (five) minutes as needed., Disp: 25 tablet, Rfl: 0   olmesartan-hydrochlorothiazide (BENICAR HCT) 40-25 MG tablet, Take 1 tablet by  mouth daily., Disp: 90 tablet, Rfl: 1   omeprazole (PRILOSEC) 40 MG capsule, Take 1 capsule by mouth once daily, Disp: 90 capsule, Rfl: 0   OneTouch Delica Lancets 33G MISC, 1 each by Does not apply route daily., Disp: 100 each, Rfl: 2   pioglitazone (ACTOS) 15 MG tablet, Take 1 tablet by mouth once daily, Disp: 90 tablet, Rfl: 0   rizatriptan (MAXALT) 10 MG tablet, May repeat in 2 hours if needed, Disp: 10 tablet, Rfl: 11   rosuvastatin (CRESTOR) 40 MG tablet, TAKE 1 TABLET BY MOUTH ONCE DAILY. IN PLACE OF ATORVASTATIN, Disp: 90 tablet, Rfl: 1   VENTOLIN HFA 108 (90 Base) MCG/ACT inhaler, INHALE 2 PUFFS BY MOUTH EVERY 6 HOURS AS NEEDED FOR WHEEZING OR SHORTNESS OF BREATH, Disp: 36 g, Rfl: 0    Family History  Problem Relation Age of Onset   Diabetes Sister    Anxiety disorder Sister    Depression Sister    Hypertension Sister    Cirrhosis Mother    Diabetes Mother    Cirrhosis Father    Breast cancer Sister 95   Heart attack Brother    Alcohol abuse Brother    Drug abuse Brother    Prostate cancer Neg Hx    Kidney cancer Neg Hx    Bladder Cancer Neg Hx      Social History   Tobacco Use   Smoking status: Never   Smokeless tobacco: Never  Vaping Use   Vaping status: Never Used  Substance Use Topics   Alcohol use: No    Alcohol/week: 0.0 standard drinks of alcohol   Drug use: No    Allergies as of 07/02/2023 - Review Complete 07/02/2023  Allergen Reaction Noted   Latex Swelling 04/24/2015    Review of Systems:    All systems reviewed and negative except where noted in HPI.   Physical Exam:  BP (!) 146/87 (BP Location: Left Arm, Patient Position: Sitting, Cuff Size: Large)   Pulse 76   Temp 97.9 F (36.6 C) (Oral)   Ht 5\' 1"  (1.549 m)   Wt 234 lb (106.1 kg)   BMI 44.21 kg/m  No LMP recorded. Patient has had a hysterectomy.  General:   Alert,  Well-developed, well-nourished, pleasant and cooperative in NAD Head:  Normocephalic and atraumatic. Eyes:  Sclera  clear, no icterus.   Conjunctiva pink. Ears:  Normal auditory acuity. Nose:  No deformity, discharge, or lesions. Mouth:  No deformity or lesions,oropharynx pink &  moist. Neck:  Supple; no masses or thyromegaly. Lungs:  Respirations even and unlabored.  Clear throughout to auscultation.   No wheezes, crackles, or rhonchi. No acute distress. Heart:  Regular rate and rhythm; no murmurs, clicks, rubs, or gallops. Abdomen:  Normal bowel sounds. Soft, obese, non-tender and non-distended without masses, hepatosplenomegaly or hernias noted.  No guarding or rebound tenderness.   Rectal: Not performed Msk:  Symmetrical without gross deformities. Good, equal movement & strength bilaterally. Pulses:  Normal pulses noted. Extremities:  No clubbing or edema.  No cyanosis. Neurologic:  Alert and oriented x3;  grossly normal neurologically. Skin:  Intact without significant lesions or rashes. No jaundice. Psych:  Alert and cooperative. Normal mood and affect.  Imaging Studies:   Assessment and Plan:   Tone Millage is a 62 y.o. pleasant African-American female with metabolic syndrome, BMI 44, chronic GERD, chronic constipation on Linzess is seen in consultation for chronic nausea and mild abdominal discomfort  Chronic nausea with mild abdominal discomfort Recommend HIDA scan Normal EGD in the past Normal CT abdomen and pelvis with contrast in 2021 Negative gastric emptying study Continue omeprazole 40 mg daily Patient has been gaining weight  Chronic constipation under control Continue Linzess 145 MCG daily Reiterated on high-fiber diet and adequate intake of water  Mild iron deficiency anemia Recommend upper endoscopy and colonoscopy for further evaluation She denies frequent use of Goody powders Recommend fusion plus every other day, samples given  Follow up as needed  Beverly Barrett

## 2023-07-03 ENCOUNTER — Telehealth: Payer: Self-pay | Admitting: Pharmacy Technician

## 2023-07-03 ENCOUNTER — Other Ambulatory Visit (HOSPITAL_COMMUNITY): Payer: Self-pay

## 2023-07-03 NOTE — Telephone Encounter (Signed)
Pharmacy Patient Advocate Encounter   Received notification from Fax that prior authorization for Emgality 120mg  is required/requested.   Insurance verification completed.   The patient is insured through [place] and [place] .   Per test claim: Refill too soon. PA is not needed at this time. Medication was filled 07/01/23 for $4. Next eligible fill date is 07/13/23.

## 2023-07-04 NOTE — Progress Notes (Deleted)
Name: Beverly Barrett   MRN: [id number]    DOB: [birth year]   Date:07/04/2023       Progress Note  Subjective  Chief Complaint  No chief complaint on file.   HPI  *** Patient Active Problem List   Diagnosis Date Noted   Angina pectoris associated with type 2 diabetes mellitus (HCC) 12/04/2022   Insomnia due to medical condition 12/04/2022   Bereavement 12/04/2022   MDD (major depressive disorder), recurrent, in full remission (HCC) 12/26/2021   Polyp of colon    Chronic constipation 12/03/2021   Asthma, well controlled 12/03/2021   Migraine without aura and without status migrainosus, not intractable 12/03/2021   Memory loss 08/27/2021   Sleep disorder 07/19/2021   Dyslipidemia associated with type 2 diabetes mellitus (HCC) 03/06/2020   MDD (major depressive disorder), recurrent episode, moderate (HCC) 04/07/2019   Chronic low back pain 12/31/2018   Grade II internal hemorrhoids 05/16/2017   Mixed stress and urge urinary incontinence 11/12/2016   Degenerative arthritis of lumbar spine 11/12/2016   Common migraine with intractable migraine 09/20/2016   Iron deficiency anemia 09/12/2016   GAD (generalized anxiety disorder) 12/20/2015   OSA on CPAP 12/20/2015   Benign neoplasm of sigmoid colon    Chronic pain of multiple joints 06/14/2014   Arthritis, degenerative 06/14/2014   Morbid obesity with BMI of 40.0-44.9, adult (HCC) 06/14/2014   Hypothyroidism due to acquired atrophy of thyroid 08/18/2013   Hypertension goal BP (blood pressure) < 140/90 12/07/2010   Familial multiple lipoprotein-type hyperlipidemia 12/07/2010   Allergic rhinitis 10/13/2008    Past Surgical History:  Procedure Laterality Date   ABDOMINAL HYSTERECTOMY     bladder tach  1995   CESAREAN SECTION     COLONOSCOPY WITH PROPOFOL N/A 05/29/2015   Procedure: COLONOSCOPY WITH PROPOFOL;  Surgeon: Midge Minium, MD;  Location: [facility];  Service: Endoscopy;  Laterality: N/A;  Latex allergy sleep  apnea - no CPAP machine (yet)   COLONOSCOPY WITH PROPOFOL N/A 12/18/2021   Procedure: COLONOSCOPY WITH PROPOFOL;  Surgeon: Toney Reil, MD;  Location: [facility];  Service: Gastroenterology;  Laterality: N/A;   ESOPHAGOGASTRODUODENOSCOPY N/A 04/23/2017   Procedure: ESOPHAGOGASTRODUODENOSCOPY (EGD);  Surgeon: Toney Reil, MD;  Location: [facility];  Service: Gastroenterology;  Laterality: N/A;  Latex allergy sleep apnea   POLYPECTOMY  05/29/2015   Procedure: POLYPECTOMY;  Surgeon: Midge Minium, MD;  Location: [facility];  Service: Endoscopy;;    Family History  Problem Relation Age of Onset   Diabetes Sister    Anxiety disorder Sister    Depression Sister    Hypertension Sister    Cirrhosis Mother    Diabetes Mother    Cirrhosis Father    Breast cancer Sister 89   Heart attack Brother    Alcohol abuse Brother    Drug abuse Brother    Prostate cancer Neg Hx    Kidney cancer Neg Hx    Bladder Cancer Neg Hx     Social History   Tobacco Use   Smoking status: Never   Smokeless tobacco: Never  Substance Use Topics   Alcohol use: No    Alcohol/week: 0.0 standard drinks of alcohol     Current Outpatient Medications:    acetaminophen (TYLENOL) 500 MG tablet, Take 2 tablets (1,000 mg total) by mouth every 6 (six) hours as needed., Disp: 30 tablet, Rfl: 0   amLODipine (NORVASC) 2.5 MG tablet, Take 1 tablet (2.5 mg total) by mouth daily., Disp: 90 tablet,  Rfl: 1   ARNUITY ELLIPTA 100 MCG/ACT AEPB, Inhale 1 puff by mouth once daily, Disp: 30 each, Rfl: 0   aspirin 81 MG EC tablet, Take 81 mg by mouth daily., Disp: , Rfl:    Aspirin-Acetaminophen-Caffeine (GOODY HEADACHE PO), Take 1 applicator by mouth every other day., Disp: , Rfl:    cloNIDine (CATAPRES) 0.1 MG tablet, Take 1 tablet (0.1 mg total) by mouth at bedtime., Disp: 90 tablet, Rfl: 1   estradiol (ESTRACE VAGINAL) 0.1 MG/GM vaginal cream, Apply 0.5mg  (pea-sized amount)  just inside the  vaginal introitus with a finger-tip on Monday, Wednesday and Friday nights., Disp: 30 g, Rfl: 12   fluticasone (FLONASE) 50 MCG/ACT nasal spray, Place 2 sprays into both nostrils daily as needed for allergies., Disp: 48 mL, Rfl: 0   gabapentin (NEURONTIN) 400 MG capsule, TAKE 1 CAPSULE BY MOUTH IN THE MORNING THEN 1 CAPSULE MIDDAY THEN 2 CAPSULES IN THE EVENING, Disp: 360 capsule, Rfl: 3   Galcanezumab-gnlm (EMGALITY) 120 MG/ML SOAJ, Inject 120 mg into the skin every 30 (thirty) days., Disp: 1.12 mL, Rfl: 11   glucose blood test strip, Use as instructed, Disp: 100 each, Rfl: 12   Lancet Devices (ONE TOUCH DELICA LANCING DEV) MISC, 1 each by Does not apply route daily., Disp: 1 each, Rfl: 0   levothyroxine (SYNTHROID) 100 MCG tablet, Take 100 mcg by mouth every morning., Disp: , Rfl:    linaclotide (LINZESS) 145 MCG CAPS capsule, Take 1 capsule (145 mcg total) by mouth daily., Disp: 90 capsule, Rfl: 1   metFORMIN (GLUCOPHAGE-XR) 750 MG 24 hr tablet, TAKE 2 TABLETS BY MOUTH ONCE DAILY WITH BREAKFAST, Disp: 180 tablet, Rfl: 0   metoCLOPramide (REGLAN) 5 MG tablet, Take 1 tablet (5 mg total) by mouth daily before supper., Disp: 90 tablet, Rfl: 1   metoprolol succinate (TOPROL-XL) 50 MG 24 hr tablet, TAKE 1 TABLET BY MOUTH ONCE A DAY WITH OR IMMEDIATELY FOLLOWING A MEAL., Disp: 90 tablet, Rfl: 1   mirtazapine (REMERON) 7.5 MG tablet, TAKE 1 TABLET BY MOUTH AT BEDTIME, Disp: 90 tablet, Rfl: 0   montelukast (SINGULAIR) 10 MG tablet, Take 1 tablet (10 mg total) by mouth at bedtime., Disp: 90 tablet, Rfl: 1   nitroGLYCERIN (NITROSTAT) 0.4 MG SL tablet, Place 1 tablet (0.4 mg total) under the tongue every 5 (five) minutes as needed., Disp: 25 tablet, Rfl: 0   olmesartan-hydrochlorothiazide (BENICAR HCT) 40-25 MG tablet, Take 1 tablet by mouth daily., Disp: 90 tablet, Rfl: 1   omeprazole (PRILOSEC) 40 MG capsule, Take 1 capsule by mouth once daily, Disp: 90 capsule, Rfl: 0   OneTouch Delica Lancets 33G MISC, 1  each by Does not apply route daily., Disp: 100 each, Rfl: 2   pioglitazone (ACTOS) 15 MG tablet, Take 1 tablet by mouth once daily, Disp: 90 tablet, Rfl: 0   rizatriptan (MAXALT) 10 MG tablet, May repeat in 2 hours if needed, Disp: 10 tablet, Rfl: 11   rosuvastatin (CRESTOR) 40 MG tablet, TAKE 1 TABLET BY MOUTH ONCE DAILY. IN PLACE OF ATORVASTATIN, Disp: 90 tablet, Rfl: 1   VENTOLIN HFA 108 (90 Base) MCG/ACT inhaler, INHALE 2 PUFFS BY MOUTH EVERY 6 HOURS AS NEEDED FOR WHEEZING OR SHORTNESS OF BREATH, Disp: 36 g, Rfl: 0  Allergies  Allergen Reactions   Latex Swelling    Pt unsure of reaction pt states something happened while in the hospital after surgery.    I personally reviewed {[id number]} with the patient/caregiver today.   ROS  ***  Objective  There were no vitals filed for this visit.  There is no height or weight on file to calculate BMI.  Physical Exam ***  No results found for this or any previous visit (from the past 2160 hour(s)).  Diabetic Foot Exam: Diabetic Foot Exam - Simple   No data filed    ***  PHQ2/9:    04/30/2023   12:06 PM 04/04/2023   10:09 AM 12/04/2022    2:26 PM 12/04/2022   10:57 AM 08/06/2022    1:10 PM  Depression screen PHQ 2/9  Decreased Interest  2  2   Down, Depressed, Hopeless  2  2   PHQ - 2 Score  4  4   Altered sleeping  2  2   Tired, decreased energy  2  2   Change in appetite  2  0   Feeling bad or failure about yourself   1  2   Trouble concentrating  0  0   Moving slowly or fidgety/restless  0  0   Suicidal thoughts  0  0   PHQ-9 Score  11  10   Difficult doing work/chores  Somewhat difficult  Somewhat difficult      Information is confidential and restricted. Go to Review Flowsheets to unlock data.    phq 9 is {gen pos [id number]} ***  Fall Risk:    04/04/2023   10:09 AM 12/04/2022   10:51 AM 08/05/2022   10:32 AM 05/15/2022   11:45 AM 04/03/2022   10:12 AM  Fall Risk   Falls in the past year? 0 1 0 0 0  Number  falls in past yr:  1 0 0 0  Injury with Fall?  0 0 0 0  Risk for fall due to : No Fall Risks History of fall(s) Impaired balance/gait  Impaired balance/gait  Follow up Falls prevention discussed Falls evaluation completed;Education provided;Falls prevention discussed Falls prevention discussed Falls evaluation completed Falls prevention discussed   ***   Functional Status Survey:   ***   Assessment & Plan  *** 1. Dyslipidemia associated with type 2 diabetes mellitus (HCC) ***
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
## 2023-07-07 NOTE — Progress Notes (Signed)
Name: Beverly Barrett   MRN: [id number]    DOB: 03-02-1961   Date:07/18/2023       Progress Note  Subjective  Chief Complaint  Chief Complaint  Patient presents with   Medical Management of Chronic Issues    HPI  Discussed the use of AI scribe software for clinical note transcription with the patient, who gave verbal consent to proceed.  History of Present Illness   The patient, with a history of type 2 diabetes, multinodular goiter, gallbladder dyskinesia, and chronic pain, presented for a four-month follow-up. She reported recent ultrasound imaging for the multinodular goiter and hepatobiliary imaging for the gallbladder dyskinesia.  The patient's diabetes control was reported as good, with a recent A1c of 6%. However, she expressed concerns about persistent thirst and numbness in her feet, indicative of peripheral neuropathy. The patient is currently on pioglitazone and metformin for diabetes management and gabapentin for neuropathy, which she reported as somewhat effective.  The patient also reported chronic left shoulder pain and low back pain, for which she is seeing a specialist at the [facility]. She has a history of angina pectoris associated with type 2 diabetes, but she reported no current chest pain or palpitations, she has NTG at home but takes it very seldom.  The patient has a history of major depression and anxiety, currently managed by a psychiatrist and is only taking Mirtazapine at thist ime. She reported her depression as mild but noted it was worse at the time of the consultation due to personal stressors.  The patient also has a diagnosis of moderate persistent asthma, managed with Arnuity and occasional use of Albuterol. She reported occasional shortness of breath but no daily cough or wheezing  The patient has a history of obstructive sleep apnea and reported regular use of her CPAP machine. She also has a diagnosis of morbid obesity and reported a recent weight  loss of four pounds.  The patient is also on iron supplements for iron deficiency anemia and is scheduled for a colonoscopy. She reported taking B12 and Vitamin D supplements, with recent labs showing slight improvement in B12 levels but slightly low Vitamin D.  Lastly, the patient reported chronic idiopathic constipation, managed with Linzess, and gastroesophageal reflux disease (GERD), managed with omeprazole. However, the patient's indigestion symptoms persist, likely due to the gallbladder dyskinesia, she is seeing a Careers adviser early 2025.         Patient Active Problem List   Diagnosis Date Noted   Angina pectoris associated with type 2 diabetes mellitus (HCC) 12/04/2022   Insomnia due to medical condition 12/04/2022   Bereavement 12/04/2022   MDD (major depressive disorder), recurrent, in full remission (HCC) 12/26/2021   Polyp of colon    Chronic constipation 12/03/2021   Asthma, well controlled 12/03/2021   Migraine without aura and without status migrainosus, not intractable 12/03/2021   Memory loss 08/27/2021   Sleep disorder 07/19/2021   Dyslipidemia associated with type 2 diabetes mellitus (HCC) 03/06/2020   MDD (major depressive disorder), recurrent episode, moderate (HCC) 04/07/2019   Chronic low back pain 12/31/2018   Grade II internal hemorrhoids 05/16/2017   Mixed stress and urge urinary incontinence 11/12/2016   Degenerative arthritis of lumbar spine 11/12/2016   Common migraine with intractable migraine 09/20/2016   Iron deficiency anemia 09/12/2016   GAD (generalized anxiety disorder) 12/20/2015   OSA on CPAP 12/20/2015   Benign neoplasm of sigmoid colon    Chronic pain of multiple joints 06/14/2014   Arthritis, degenerative  06/14/2014   Morbid obesity with BMI of 40.0-44.9, adult (HCC) 06/14/2014   Hypothyroidism due to acquired atrophy of thyroid 08/18/2013   Hypertension goal BP (blood pressure) < 140/90 12/07/2010   Familial multiple lipoprotein-type  hyperlipidemia 12/07/2010   Allergic rhinitis 10/13/2008    Past Surgical History:  Procedure Laterality Date   ABDOMINAL HYSTERECTOMY     bladder tach  1995   CESAREAN SECTION     COLONOSCOPY WITH PROPOFOL N/A 05/29/2015   Procedure: COLONOSCOPY WITH PROPOFOL;  Surgeon: Midge Minium, MD;  Location: [facility];  Service: Endoscopy;  Laterality: N/A;  Latex allergy sleep apnea - no CPAP machine (yet)   COLONOSCOPY WITH PROPOFOL N/A 12/18/2021   Procedure: COLONOSCOPY WITH PROPOFOL;  Surgeon: Toney Reil, MD;  Location: [facility];  Service: Gastroenterology;  Laterality: N/A;   ESOPHAGOGASTRODUODENOSCOPY N/A 04/23/2017   Procedure: ESOPHAGOGASTRODUODENOSCOPY (EGD);  Surgeon: Toney Reil, MD;  Location: [facility];  Service: Gastroenterology;  Laterality: N/A;  Latex allergy sleep apnea   POLYPECTOMY  05/29/2015   Procedure: POLYPECTOMY;  Surgeon: Midge Minium, MD;  Location: [facility];  Service: Endoscopy;;    Family History  Problem Relation Age of Onset   Diabetes Sister    Anxiety disorder Sister    Depression Sister    Hypertension Sister    Cirrhosis Mother    Diabetes Mother    Cirrhosis Father    Breast cancer Sister 9   Heart attack Brother    Alcohol abuse Brother    Drug abuse Brother    Prostate cancer Neg Hx    Kidney cancer Neg Hx    Bladder Cancer Neg Hx     Social History   Tobacco Use   Smoking status: Never   Smokeless tobacco: Never  Substance Use Topics   Alcohol use: No    Alcohol/week: 0.0 standard drinks of alcohol     Current Outpatient Medications:    acetaminophen (TYLENOL) 500 MG tablet, Take 2 tablets (1,000 mg total) by mouth every 6 (six) hours as needed., Disp: 30 tablet, Rfl: 0   amLODipine (NORVASC) 2.5 MG tablet, Take 1 tablet (2.5 mg total) by mouth daily., Disp: 90 tablet, Rfl: 1   ARNUITY ELLIPTA 100 MCG/ACT AEPB, Inhale 1 puff by mouth once daily, Disp: 30 each, Rfl: 0   aspirin 81  MG EC tablet, Take 81 mg by mouth daily., Disp: , Rfl:    cloNIDine (CATAPRES) 0.1 MG tablet, Take 1 tablet (0.1 mg total) by mouth at bedtime., Disp: 90 tablet, Rfl: 1   dimenhyDRINATE (DRAMAMINE) 50 MG tablet, Take 50 mg by mouth every 8 (eight) hours as needed., Disp: , Rfl:    estradiol (ESTRACE VAGINAL) 0.1 MG/GM vaginal cream, Apply 0.5mg  (pea-sized amount)  just inside the vaginal introitus with a finger-tip on Monday, Wednesday and Friday nights., Disp: 30 g, Rfl: 12   fluticasone (FLONASE) 50 MCG/ACT nasal spray, Place 2 sprays into both nostrils daily as needed for allergies., Disp: 48 mL, Rfl: 0   gabapentin (NEURONTIN) 400 MG capsule, Take 1 capsule in the morning, take 2 at night, Disp: 270 capsule, Rfl: 1   Galcanezumab-gnlm (EMGALITY) 120 MG/ML SOAJ, Inject 120 mg into the skin every 30 (thirty) days., Disp: 1.12 mL, Rfl: 11   glucose blood test strip, Use as instructed, Disp: 100 each, Rfl: 12   Iron-FA-B Cmp-C-Biot-Probiotic (FUSION PLUS) CAPS, Take by mouth., Disp: , Rfl:    Lancet Devices (ONE TOUCH DELICA LANCING DEV) MISC, 1  each by Does not apply route daily., Disp: 1 each, Rfl: 0   levothyroxine (SYNTHROID) 100 MCG tablet, Take 100 mcg by mouth every morning., Disp: , Rfl:    linaclotide (LINZESS) 145 MCG CAPS capsule, Take 1 capsule (145 mcg total) by mouth daily., Disp: 90 capsule, Rfl: 1   metFORMIN (GLUCOPHAGE-XR) 750 MG 24 hr tablet, TAKE 2 TABLETS BY MOUTH ONCE DAILY WITH BREAKFAST, Disp: 180 tablet, Rfl: 0   metoCLOPramide (REGLAN) 5 MG tablet, Take 1 tablet (5 mg total) by mouth daily before supper., Disp: 90 tablet, Rfl: 1   metoprolol succinate (TOPROL-XL) 50 MG 24 hr tablet, TAKE 1 TABLET BY MOUTH ONCE A DAY WITH OR IMMEDIATELY FOLLOWING A MEAL., Disp: 90 tablet, Rfl: 1   mirtazapine (REMERON) 7.5 MG tablet, TAKE 1 TABLET BY MOUTH AT BEDTIME, Disp: 90 tablet, Rfl: 0   montelukast (SINGULAIR) 10 MG tablet, Take 1 tablet (10 mg total) by mouth at bedtime., Disp: 90  tablet, Rfl: 1   nitroGLYCERIN (NITROSTAT) 0.4 MG SL tablet, Place 1 tablet (0.4 mg total) under the tongue every 5 (five) minutes as needed., Disp: 25 tablet, Rfl: 0   olmesartan-hydrochlorothiazide (BENICAR HCT) 40-25 MG tablet, Take 1 tablet by mouth daily., Disp: 90 tablet, Rfl: 1   omeprazole (PRILOSEC) 40 MG capsule, Take 1 capsule by mouth once daily, Disp: 90 capsule, Rfl: 0   OneTouch Delica Lancets 33G MISC, 1 each by Does not apply route daily., Disp: 100 each, Rfl: 2   pioglitazone (ACTOS) 15 MG tablet, Take 1 tablet by mouth once daily, Disp: 90 tablet, Rfl: 0   rizatriptan (MAXALT) 10 MG tablet, May repeat in 2 hours if needed, Disp: 10 tablet, Rfl: 11   rosuvastatin (CRESTOR) 40 MG tablet, TAKE 1 TABLET BY MOUTH ONCE DAILY. IN PLACE OF ATORVASTATIN, Disp: 90 tablet, Rfl: 1   VENTOLIN HFA 108 (90 Base) MCG/ACT inhaler, INHALE 2 PUFFS BY MOUTH EVERY 6 HOURS AS NEEDED FOR WHEEZING OR SHORTNESS OF BREATH, Disp: 36 g, Rfl: 0   Aspirin-Acetaminophen-Caffeine (GOODY HEADACHE PO), Take 1 applicator by mouth every other day. (Patient not taking: Reported on 07/18/2023), Disp: , Rfl:    Na Sulfate-K Sulfate-Mg Sulf 17.5-3.13-1.6 GM/177ML SOLN, USE AS DIRECTED ONCE FOR 1 DOSE (Patient not taking: Reported on 07/18/2023), Disp: , Rfl:   Allergies  Allergen Reactions   Latex Swelling    Pt unsure of reaction pt states something happened while in the hospital after surgery.    I personally reviewed active problem list, medication list, allergies, family history with the patient/caregiver today.   ROS  Ten systems reviewed and is negative except as mentioned in HPI    Objective  Vitals:   07/18/23 0933  BP: 132/84  Pulse: 85  Resp: 16  Temp: 98 F (36.7 C)  TempSrc: Oral  SpO2: 95%  Weight: 228 lb 14.4 oz (103.8 kg)  Height: 5\' 1"  (1.549 m)    Body mass index is 43.25 kg/m.  Physical Exam  Constitutional: Patient appears well-developed and well-nourished. Obese  No  distress.  HEENT: head atraumatic, normocephalic, pupils equal and reactive to light, neck supple Cardiovascular: Normal rate, regular rhythm and normal heart sounds.  No murmur heard. No BLE edema. Pulmonary/Chest: Effort normal and breath sounds normal. No respiratory distress. Abdominal: Soft.  There is no tenderness. Psychiatric: Patient has a normal mood and affect. behavior is normal. Judgment and thought content normal.   Recent Results (from the past 2160 hours)  POCT glycosylated hemoglobin (Hb  A1C)     Status: Abnormal   Collection Time: 07/18/23  9:33 AM  Result Value Ref Range   Hemoglobin A1C 6.0 (A) 4.0 - 5.6 %   HbA1c POC (<> result, manual entry)     HbA1c, POC (prediabetic range)     HbA1c, POC (controlled diabetic range)       PHQ2/9:    07/18/2023    9:32 AM 04/30/2023   12:06 PM 04/04/2023   10:09 AM 12/04/2022    2:26 PM 12/04/2022   10:57 AM  Depression screen PHQ 2/9  Decreased Interest 1  2  2   Down, Depressed, Hopeless 1  2  2   PHQ - 2 Score 2  4  4   Altered sleeping 1  2  2   Tired, decreased energy 1  2  2   Change in appetite 1  2  0  Feeling bad or failure about yourself  1  1  2   Trouble concentrating 1  0  0  Moving slowly or fidgety/restless 0  0  0  Suicidal thoughts 0  0  0  PHQ-9 Score 7  11  10   Difficult doing work/chores Very difficult  Somewhat difficult  Somewhat difficult     Information is confidential and restricted. Go to Review Flowsheets to unlock data.    phq 9 is positive   Fall Risk:    07/18/2023    9:24 AM 04/04/2023   10:09 AM 12/04/2022   10:51 AM 08/05/2022   10:32 AM 05/15/2022   11:45 AM  Fall Risk   Falls in the past year? 0 0 1 0 0  Number falls in past yr: 0  1 0 0  Injury with Fall? 0  0 0 0  Risk for fall due to : No Fall Risks No Fall Risks History of fall(s) Impaired balance/gait   Follow up Falls prevention discussed;Education provided;Falls evaluation completed Falls prevention discussed Falls evaluation  completed;Education provided;Falls prevention discussed Falls prevention discussed Falls evaluation completed      Assessment & Plan  Assessment and Plan    Gallbladder Dyskinesia Reduced gallbladder ejection fraction on hepatobiliary imaging. No gallstones present. Discussed potential need for cholecystectomy. -Surgical consultation scheduled for 08/04/2023.  Multinodular Goiter/Hypothyroidism Recent ultrasound performed. Patient is on Levothyroxine daily. -Continue Levothyroxine daily.  Type 2 Diabetes Mellitus with angina, HTN, dyslipidemia Well-controlled with A1c of 6%. Patient reports increased thirst and peripheral neuropathy. -Continue Pioglitazone 50mg  daily and Metformin 750mg  twice daily.  Iron Deficiency Anemia Iron stores slightly low but not anemic. Patient is on iron supplementation. -Continue iron supplementation as prescribed by GI specialist.  Vitamin D Deficiency Slightly low levels. Patient is on supplementation. -Continue Vitamin D 2000 units daily.  Hypertension Controlled with current regimen. -Continue Olmesartan/HCTZ 40/25mg  daily, Amlodipine 2.5mg  daily, Clonidine 0.1mg  nightly, and Metoprolol 50mg  daily.  Hyperlipidemia Well-controlled with current regimen. -Continue Rosuvastatin.  Gastroesophageal Reflux Disease (GERD) Patient is on Omeprazole. -Continue Omeprazole.  Chronic Idiopathic Constipation Patient is on Linzess. -Continue Linzess as prescribed.  Moderate Persistent Asthma Controlled with current regimen. Patient uses Albuterol as needed. -Continue Montelukast and Arnuity inhaler. Use Albuterol as needed.  Major Depression mild recurrent  and Anxiety Mild symptoms currently. Patient is on Mirtazapine 7.5mg  at bedtime. -Continue Mirtazapine 7.5mg  at bedtime.  Angina Pectoris associated with Type 2 Diabetes Medically controlled. No current chest pain or palpitations. -Continue Metoprolol 50mg  daily.  Obstructive  Sleep Apnea Patient uses CPAP. -Continue CPAP use.  Chronic Pain Patient sees  pain clinic and orthopedic specialist for management. -Follow up with pain clinic and orthopedic specialist as needed.  General Health Maintenance -Administer Pneumococcal vaccine today. -Continue B12 supplementation. -Follow up in 4 months.      Morbid obesity -reminded her of healthy diet and to continue losing weight

## 2023-07-08 ENCOUNTER — Other Ambulatory Visit (HOSPITAL_COMMUNITY): Payer: Self-pay

## 2023-07-08 ENCOUNTER — Ambulatory Visit (INDEPENDENT_AMBULATORY_CARE_PROVIDER_SITE_OTHER): Payer: Commercial Managed Care - HMO | Admitting: Neurology

## 2023-07-08 ENCOUNTER — Encounter: Payer: Self-pay | Admitting: Neurology

## 2023-07-08 VITALS — BP 164/92 | HR 73 | Ht 61.0 in | Wt 232.5 lb

## 2023-07-08 DIAGNOSIS — G43009 Migraine without aura, not intractable, without status migrainosus: Secondary | ICD-10-CM

## 2023-07-08 MED ORDER — RIZATRIPTAN BENZOATE 10 MG PO TABS: ORAL_TABLET | ORAL | 11 refills | Status: DC

## 2023-07-08 MED ORDER — EMGALITY 120 MG/ML ~~LOC~~ SOAJ: 120.0000 mg | SUBCUTANEOUS | 11 refills | Status: DC

## 2023-07-08 MED ORDER — GABAPENTIN 400 MG PO CAPS: ORAL_CAPSULE | ORAL | 1 refills | Status: DC

## 2023-07-08 NOTE — Patient Instructions (Signed)
Try to reduce gabapentin down to 400 mg, 1 in the morning, 2 at night Continue emgality for migraine prevention  Use maxalt at onset of severe headache, not to overuse

## 2023-07-09 ENCOUNTER — Telehealth: Payer: Self-pay | Admitting: Pharmacist

## 2023-07-09 ENCOUNTER — Other Ambulatory Visit: Payer: Self-pay | Admitting: Pharmacist

## 2023-07-09 NOTE — Progress Notes (Signed)
   07/09/2023  Patient ID: Melodye Ped, female   DOB: May 17, 1961, 62 y.o.   MRN: [id number]  Reach patient by telephone today.  Reports her stomach has not been feeling well recently and is following up with GI Specialist Dr. Allegra Lai for testing/procedures.  Denies difficulty with affording medications at this time.  Will follow up in February as requested  Follow Up Plan: Clinical Pharmacist will follow up with patient by telephone on 09/03/2023 at 1:30 PM    Estelle Grumbles, PharmD, [facility] [phone]

## 2023-07-14 ENCOUNTER — Ambulatory Visit
Admission: RE | Admit: 2023-07-14 | Discharge: 2023-07-14 | Disposition: A | Discharge status: Home / Self Care | Payer: Commercial Managed Care - HMO | Source: Ambulatory Visit | Attending: Gastroenterology | Admitting: Gastroenterology

## 2023-07-14 ENCOUNTER — Telehealth: Payer: Self-pay

## 2023-07-14 DIAGNOSIS — R110 Nausea: Secondary | ICD-10-CM | POA: Diagnosis present

## 2023-07-14 DIAGNOSIS — R1013 Epigastric pain: Secondary | ICD-10-CM | POA: Insufficient documentation

## 2023-07-14 DIAGNOSIS — K828 Other specified diseases of gallbladder: Secondary | ICD-10-CM

## 2023-07-14 MED ORDER — TECHNETIUM TC 99M MEBROFENIN IV KIT
4.9000 | PACK | Freq: Once | INTRAVENOUS | Status: AC | PRN
  Administered 2023-07-14: 4.9 via INTRAVENOUS

## 2023-07-14 NOTE — Telephone Encounter (Signed)
-----   Message from [facility] sent at 07/14/2023  4:23 PM EST ----- Please inform patient that the HIDA scan result suggest that her gallbladder is not functioning normally which could explain her symptoms of nausea and abdominal discomfort.  If she is agreeable, recommend referral to general surgery to evaluate for cholecystectomy for biliary dyskinesia  Rohini Vanga

## 2023-07-14 NOTE — Telephone Encounter (Signed)
Patient verbalized understanding of results she is okay with placing referral

## 2023-07-17 ENCOUNTER — Ambulatory Visit: Payer: Commercial Managed Care - HMO | Admitting: Family Medicine

## 2023-07-18 ENCOUNTER — Encounter: Payer: Self-pay | Admitting: Family Medicine

## 2023-07-18 ENCOUNTER — Ambulatory Visit: Payer: Commercial Managed Care - HMO | Admitting: Family Medicine

## 2023-07-18 VITALS — BP 132/84 | HR 85 | Temp 98.0°F | Resp 16 | Ht 61.0 in | Wt 228.9 lb

## 2023-07-18 DIAGNOSIS — E1159 Type 2 diabetes mellitus with other circulatory complications: Secondary | ICD-10-CM

## 2023-07-18 DIAGNOSIS — I152 Hypertension secondary to endocrine disorders: Secondary | ICD-10-CM

## 2023-07-18 DIAGNOSIS — E1169 Type 2 diabetes mellitus with other specified complication: Secondary | ICD-10-CM | POA: Diagnosis not present

## 2023-07-18 DIAGNOSIS — K219 Gastro-esophageal reflux disease without esophagitis: Secondary | ICD-10-CM

## 2023-07-18 DIAGNOSIS — K5904 Chronic idiopathic constipation: Secondary | ICD-10-CM

## 2023-07-18 DIAGNOSIS — Z23 Encounter for immunization: Secondary | ICD-10-CM

## 2023-07-18 DIAGNOSIS — F330 Major depressive disorder, recurrent, mild: Secondary | ICD-10-CM | POA: Diagnosis not present

## 2023-07-18 DIAGNOSIS — Z6841 Body Mass Index (BMI) 40.0 and over, adult: Secondary | ICD-10-CM

## 2023-07-18 DIAGNOSIS — J4540 Moderate persistent asthma, uncomplicated: Secondary | ICD-10-CM

## 2023-07-18 DIAGNOSIS — E042 Nontoxic multinodular goiter: Secondary | ICD-10-CM

## 2023-07-18 DIAGNOSIS — G4733 Obstructive sleep apnea (adult) (pediatric): Secondary | ICD-10-CM

## 2023-07-18 DIAGNOSIS — I209 Angina pectoris, unspecified: Secondary | ICD-10-CM

## 2023-07-18 LAB — POCT GLYCOSYLATED HEMOGLOBIN (HGB A1C): Hemoglobin A1C: 6 % — AB (ref 4.0–5.6)

## 2023-07-18 MED ORDER — ARNUITY ELLIPTA 100 MCG/ACT IN AEPB: 1.0000 | INHALATION_SPRAY | Freq: Every day | RESPIRATORY_TRACT | 5 refills | Status: DC

## 2023-07-18 MED ORDER — OLMESARTAN MEDOXOMIL-HCTZ 40-25 MG PO TABS: 1.0000 | ORAL_TABLET | Freq: Every day | ORAL | 1 refills | Status: DC

## 2023-07-18 MED ORDER — MONTELUKAST SODIUM 10 MG PO TABS: 10.0000 mg | ORAL_TABLET | Freq: Every day | ORAL | 1 refills | Status: DC

## 2023-07-18 MED ORDER — PIOGLITAZONE HCL 15 MG PO TABS: 15.0000 mg | ORAL_TABLET | Freq: Every day | ORAL | 1 refills | Status: DC

## 2023-07-18 MED ORDER — METFORMIN HCL ER 750 MG PO TB24: 1500.0000 mg | ORAL_TABLET | Freq: Every day | ORAL | 1 refills | Status: DC

## 2023-07-18 MED ORDER — LINACLOTIDE 145 MCG PO CAPS: 145.0000 ug | ORAL_CAPSULE | Freq: Every day | ORAL | 1 refills | Status: DC

## 2023-07-18 MED ORDER — METOPROLOL SUCCINATE ER 50 MG PO TB24: ORAL_TABLET | ORAL | 1 refills | Status: DC

## 2023-07-24 LAB — HM DIABETES FOOT EXAM: HM Diabetic Foot Exam: ABNORMAL

## 2023-07-25 ENCOUNTER — Other Ambulatory Visit (HOSPITAL_COMMUNITY): Payer: Self-pay

## 2023-07-25 ENCOUNTER — Telehealth: Payer: Self-pay

## 2023-07-28 ENCOUNTER — Other Ambulatory Visit: Payer: Self-pay | Admitting: Physician Assistant

## 2023-07-28 ENCOUNTER — Telehealth: Payer: Self-pay | Admitting: Gastroenterology

## 2023-07-28 ENCOUNTER — Telehealth: Payer: Self-pay

## 2023-07-28 ENCOUNTER — Other Ambulatory Visit: Payer: Self-pay

## 2023-07-28 DIAGNOSIS — D509 Iron deficiency anemia, unspecified: Secondary | ICD-10-CM

## 2023-07-28 DIAGNOSIS — R110 Nausea: Secondary | ICD-10-CM

## 2023-07-28 MED ORDER — FUSION PLUS PO CAPS: 1.0000 | ORAL_CAPSULE | Freq: Every day | ORAL | 1 refills | Status: AC

## 2023-07-28 MED ORDER — ONDANSETRON HCL 4 MG PO TABS: 4.0000 mg | ORAL_TABLET | Freq: Three times a day (TID) | ORAL | 2 refills | Status: DC | PRN

## 2023-07-28 NOTE — Progress Notes (Signed)
The patient called in because the iron medication (fusion plus) that was prescribed to the patient works and she will like a refill. The patient wanted to know if she can get something for her nausea.   I sent Rx for Fusion plus and Zofran to pt. Pharmacy. Celso Amy, PA-C

## 2023-07-28 NOTE — Telephone Encounter (Signed)
The patient called in because the iron medication (fusion plus) that was prescribed to the patient works and she will like a refill. The patient wanted to know if she can get something for her nausea.

## 2023-07-28 NOTE — Telephone Encounter (Signed)
Per VM- The patient called in because the iron medication (fusion plus) that was prescribed to the patient works and she will like a refill. The patient wanted to know if she can get something for her nausea.

## 2023-07-31 ENCOUNTER — Encounter: Payer: Self-pay | Admitting: Gastroenterology

## 2023-08-04 ENCOUNTER — Telehealth: Payer: Self-pay | Admitting: Surgery

## 2023-08-04 ENCOUNTER — Encounter: Payer: Self-pay | Admitting: Surgery

## 2023-08-04 ENCOUNTER — Ambulatory Visit (INDEPENDENT_AMBULATORY_CARE_PROVIDER_SITE_OTHER): Payer: Commercial Managed Care - HMO | Admitting: Surgery

## 2023-08-04 ENCOUNTER — Telehealth: Payer: Self-pay

## 2023-08-04 VITALS — BP 137/88 | HR 85 | Temp 98.5°F | Ht 61.0 in | Wt 226.0 lb

## 2023-08-04 DIAGNOSIS — K828 Other specified diseases of gallbladder: Secondary | ICD-10-CM | POA: Diagnosis not present

## 2023-08-04 NOTE — Progress Notes (Signed)
 08/04/2023  Reason for Visit:  Biliary Dyskinesia  Requesting Provider:  Corinn Brooklyn, MD  History of Present Illness: Beverly Barrett is a 63 y.o. female presenting for evaluation of biliary dyskinesia.  The patient reports having at least 1 year long history of epigastric abdominal pain associated with nausea and vomiting.  The pain sometimes also radiates towards her back.  She reports that this can happen after eating but sometimes is also more random times.  The pain overall does not last too long for about half an hour to an hour but sometimes also less frequently can be longer.  She has been seeing Dr. Brooklyn with gastroenterology and has had multiple studies including upper endoscopy, colonoscopy, gastric emptying study, and most recently a HIDA scan.  Her HIDA scan was done on 07/14/2023 and showed a reduced ejection fraction of 22%.  Overall denies any fevers, chills, chest pain, shortness of breath.  Past Medical History: Past Medical History:  Diagnosis Date   Anginal pain (HCC)    none in approx 2 yrs   Anxiety    Asthma    Chronic lower back pain    Common migraine with intractable migraine 09/20/2016   Depression    suicidal ideation   Diabetes mellitus without complication (HCC)    GERD (gastroesophageal reflux disease)    Headache    migraines -3x/wk   Hyperlipidemia    Hypertension    Hypothyroidism    Motion sickness    cars   Multilevel degenerative disc disease    Shortness of breath dyspnea    Sleep apnea    CPAP   Vertigo    Wears dentures    partial upper     Past Surgical History: Past Surgical History:  Procedure Laterality Date   ABDOMINAL HYSTERECTOMY     bladder tach  1995   CESAREAN SECTION     COLONOSCOPY WITH PROPOFOL  N/A 05/29/2015   Procedure: COLONOSCOPY WITH PROPOFOL ;  Surgeon: Rogelia Copping, MD;  Location: [facility];  Service: Endoscopy;  Laterality: N/A;  Latex allergy sleep apnea - no CPAP machine (yet)   COLONOSCOPY WITH  PROPOFOL  N/A 12/18/2021   Procedure: COLONOSCOPY WITH PROPOFOL ;  Surgeon: Brooklyn Corinn Skiff, MD;  Location: [facility];  Service: Gastroenterology;  Laterality: N/A;   ESOPHAGOGASTRODUODENOSCOPY N/A 04/23/2017   Procedure: ESOPHAGOGASTRODUODENOSCOPY (EGD);  Surgeon: Brooklyn Corinn Skiff, MD;  Location: [facility];  Service: Gastroenterology;  Laterality: N/A;  Latex allergy sleep apnea   POLYPECTOMY  05/29/2015   Procedure: POLYPECTOMY;  Surgeon: Rogelia Copping, MD;  Location: [facility];  Service: Endoscopy;;    Home Medications: Prior to Admission medications   Medication Sig Start Date End Date Taking? Authorizing Provider  acetaminophen  (TYLENOL ) 500 MG tablet Take 2 tablets (1,000 mg total) by mouth every 6 (six) hours as needed. 08/30/19  Yes Meuth, Brooke A, PA-C  amLODipine  (NORVASC ) 2.5 MG tablet Take 1 tablet (2.5 mg total) by mouth daily. 04/04/23  Yes Sowles, Krichna, MD  aspirin  81 MG EC tablet Take 81 mg by mouth daily.   Yes [provider]  cloNIDine  (CATAPRES ) 0.1 MG tablet Take 1 tablet (0.1 mg total) by mouth at bedtime. 04/04/23  Yes Sowles, Krichna, MD  dimenhyDRINATE (DRAMAMINE) 50 MG tablet Take 50 mg by mouth every 8 (eight) hours as needed.   Yes [provider]  estradiol  (ESTRACE  VAGINAL) 0.1 MG/GM vaginal cream Apply 0.5mg  (pea-sized amount)  just inside the vaginal introitus with a finger-tip on Monday, Wednesday and  Friday nights. 06/12/20  Yes McGowan, Clotilda A, PA-C  fluticasone  (FLONASE ) 50 MCG/ACT nasal spray Place 2 sprays into both nostrils daily as needed for allergies. 04/03/22  Yes Sowles, Krichna, MD  Fluticasone  Furoate (ARNUITY ELLIPTA ) 100 MCG/ACT AEPB Take 1 puff by mouth daily at 12 noon. 07/18/23  Yes Sowles, Krichna, MD  gabapentin  (NEURONTIN ) 400 MG capsule Take 1 capsule in the morning, take 2 at night 07/08/23  Yes Gayland Lauraine PARAS, NP  Galcanezumab -gnlm (EMGALITY ) 120 MG/ML SOAJ Inject 120 mg into the skin every 30  (thirty) days. 07/08/23  Yes Gayland Lauraine PARAS, NP  glucose blood test strip Use as instructed 03/28/21  Yes Sowles, Krichna, MD  Iron-FA-B Cmp-C-Biot-Probiotic (FUSION PLUS) CAPS Take 1 capsule by mouth daily for 60 doses. 07/28/23 09/26/23 Yes [place], PA-C  Lancet Devices (ONE TOUCH DELICA LANCING DEV) MISC 1 each by Does not apply route daily. 09/20/19  Yes Sowles, Krichna, MD  levothyroxine  (SYNTHROID ) 100 MCG tablet Take 100 mcg by mouth every morning. 05/04/23  Yes [provider]  linaclotide  (LINZESS ) 145 MCG CAPS capsule Take 1 capsule (145 mcg total) by mouth daily. 07/18/23  Yes Sowles, Krichna, MD  metFORMIN  (GLUCOPHAGE -XR) 750 MG 24 hr tablet Take 2 tablets (1,500 mg total) by mouth daily with breakfast. 07/18/23  Yes Sowles, Krichna, MD  metoCLOPramide  (REGLAN ) 5 MG tablet Take 1 tablet (5 mg total) by mouth daily before supper. 08/05/22  Yes Sowles, Krichna, MD  metoprolol  succinate (TOPROL -XL) 50 MG 24 hr tablet TAKE 1 TABLET BY MOUTH ONCE A DAY WITH OR IMMEDIATELY FOLLOWING A MEAL. 07/18/23  Yes Sowles, Krichna, MD  mirtazapine  (REMERON ) 7.5 MG tablet TAKE 1 TABLET BY MOUTH AT BEDTIME 05/05/23  Yes Eappen, Saramma, MD  montelukast  (SINGULAIR ) 10 MG tablet Take 1 tablet (10 mg total) by mouth at bedtime. 07/18/23  Yes Sowles, Krichna, MD  nitroGLYCERIN  (NITROSTAT ) 0.4 MG SL tablet Place 1 tablet (0.4 mg total) under the tongue every 5 (five) minutes as needed. 05/09/23  Yes Sowles, Krichna, MD  olmesartan -hydrochlorothiazide  (BENICAR  HCT) 40-25 MG tablet Take 1 tablet by mouth daily. 07/18/23  Yes Sowles, Krichna, MD  omeprazole  (PRILOSEC) 40 MG capsule Take 1 capsule by mouth once daily 05/12/23  Yes Vanga, Rohini Reddy, MD  ondansetron  (ZOFRAN ) 4 MG tablet Take 1 tablet (4 mg total) by mouth every 8 (eight) hours as needed for nausea or vomiting. 07/28/23 10/26/23 Yes [place], PA-C  OneTouch Delica Lancets 33G MISC 1 each by Does not apply route daily. 03/28/21  Yes Sowles,  Krichna, MD  pioglitazone  (ACTOS ) 15 MG tablet Take 1 tablet (15 mg total) by mouth daily. 07/18/23  Yes Sowles, Krichna, MD  rizatriptan  (MAXALT ) 10 MG tablet May repeat in 2 hours if needed 07/08/23  Yes Gayland Lauraine PARAS, NP  rosuvastatin  (CRESTOR ) 40 MG tablet TAKE 1 TABLET BY MOUTH ONCE DAILY. IN PLACE OF ATORVASTATIN  04/04/23  Yes Sowles, Krichna, MD  VENTOLIN  HFA 108 (90 Base) MCG/ACT inhaler INHALE 2 PUFFS BY MOUTH EVERY 6 HOURS AS NEEDED FOR WHEEZING OR SHORTNESS OF BREATH 07/01/23  Yes Sowles, Krichna, MD    Allergies: Allergies  Allergen Reactions   Latex Swelling    Pt unsure of reaction pt states something happened while in the hospital after surgery.    Social History:  reports that she has never smoked. She has never used smokeless tobacco. She reports that she does not drink alcohol and does not use drugs.   Family History: Family History  Problem  Relation Age of Onset   Diabetes Sister    Anxiety disorder Sister    Depression Sister    Hypertension Sister    Cirrhosis Mother    Diabetes Mother    Cirrhosis Father    Breast cancer Sister 68   Heart attack Brother    Alcohol abuse Brother    Drug abuse Brother    Prostate cancer Neg Hx    Kidney cancer Neg Hx    Bladder Cancer Neg Hx     Review of Systems: Review of Systems  Constitutional:  Negative for chills and fever.  HENT:  Negative for hearing loss.   Respiratory:  Negative for shortness of breath.   Cardiovascular:  Negative for chest pain.  Gastrointestinal:  Positive for abdominal pain, constipation, nausea and vomiting. Negative for diarrhea.  Genitourinary:  Negative for dysuria.  Musculoskeletal:  Negative for myalgias.  Skin:  Negative for rash.  Neurological:  Negative for dizziness.  Psychiatric/Behavioral:  Negative for depression.     Physical Exam BP 137/88   Pulse 85   Temp 98.5 F (36.9 C) (Oral)   Ht 5' 1 (1.549 m)   Wt 226 lb (102.5 kg)   SpO2 99%   BMI 42.70 kg/m   CONSTITUTIONAL: No acute distress HEENT:  Normocephalic, atraumatic, extraocular motion intact. NECK: Trachea is midline, and there is no jugular venous distension.  RESPIRATORY:  Lungs are clear, and breath sounds are equal bilaterally. Normal respiratory effort without pathologic use of accessory muscles. CARDIOVASCULAR: Heart is regular without murmurs, gallops, or rubs. GI: The abdomen is of, nondistended, with mild discomfort to palpation in the epigastric and right upper quadrant areas.  Negative Murphy's sign.  MUSCULOSKELETAL:  Normal muscle strength and tone in all four extremities.  No peripheral edema or cyanosis. SKIN: Skin turgor is normal. There are no pathologic skin lesions.  NEUROLOGIC:  Motor and sensation is grossly normal.  Cranial nerves are grossly intact. PSYCH:  Alert and oriented to person, place and time. Affect is normal.  Laboratory Analysis: Labs from 04/04/2023: Sodium 143, potassium 3.9, chloride 104, CO2 30, BUN 12, creatinine 0.52.  Total bilirubin 0.4, AST 11, ALT 7, alkaline phosphatase 53.  WBC 7.6, hemoglobin 11.9, hematocrit 36.7, platelets 276.  Hemoglobin A1c 6.0  Imaging: HIDA scan on 07/14/2023: FINDINGS: Prompt uptake and biliary excretion of activity by the liver is seen. Gallbladder activity is visualized, consistent with patency of cystic duct. Biliary activity passes into small bowel, consistent with patent common bile duct.   Calculated gallbladder ejection fraction is 22%. (Normal gallbladder ejection fraction with Ensure is greater than 33% and less than 80%.)   IMPRESSION: Reduced gallbladder ejection fraction as can be seen with chronic cholecystitis/biliary dyskinesia.  Gastric emptying study on 07/13/2021: IMPRESSION: Normal gastric emptying study.   Assessment and Plan: This is a 63 y.o. female with biliary dyskinesia.  - Discussed with the patient the findings on her HIDA scan showing biliary dyskinesia with a reduced ejection  fraction of 22%.  Discussed with the patient how the gallbladder works and the reasons for potential biliary colic which could be related to cholelithiasis or biliary dyskinesia.  Discussed with the patient that in her case, her gallbladder is not pumping well enough which is causing her symptoms.  Discussed with the patient unfortunately there is no medication or diet that she could fall to improve the pumping function of the gallbladder and the recommendation would be for surgical management in the form of cholecystectomy.  She  is in agreement. - Discussed with patient then the plan for robotic assisted cholecystectomy and reviewed with her the surgery at length including the planned incisions, the risks of bleeding, infection, injury to surrounding structures, the use of ICG to better evaluate the biliary anatomy, that this will be an outpatient procedure, postoperative activity restrictions, pain control, and she is willing to proceed. - We will tentatively schedule the patient for next week on 08/12/2023 in the afternoon.  As a precaution, we will send for medical clearance.  The patient is aware that depending on any additional testing that may be needed, this may delay her surgery date.  She currently takes aspirin  81 mg daily and we will have her hold this with the last dose on 08/06/2023. - Patient understands this plan and all of her questions have been answered.  I spent 55 minutes dedicated to the care of this patient on the date of this encounter to include pre-visit review of records, face-to-face time with the patient discussing diagnosis and management, and any post-visit coordination of care.   Aloysius Sheree Plant, MD [facility]

## 2023-08-04 NOTE — Patient Instructions (Signed)
 You have requested to have your gallbladder removed. This will be done at [facility] with Dr. Aleen Barrett.  You will most likely be out of work 1-2 weeks for this surgery.  If you have FMLA or disability paperwork that needs filled out you may drop this off at our office or this can be faxed to [phone].  You will return after your post-op appointment with a lifting restriction for approximately 4 more weeks.  You will be able to eat anything you would like to following surgery. But, start by eating a bland diet and advance this as tolerated. The Gallbladder diet is below, please go as closely by this diet as possible prior to surgery to avoid any further attacks.  Please see the (blue)pre-care form that you have been given today. Our surgery scheduler will call you to verify surgery date and to go over information.   If you have any questions, please call our office.  Laparoscopic Cholecystectomy Laparoscopic cholecystectomy is surgery to remove the gallbladder. The gallbladder is located in the upper right part of the abdomen, behind the liver. It is a storage sac for bile, which is produced in the liver. Bile aids in the digestion and absorption of fats. Cholecystectomy is often done for inflammation of the gallbladder (cholecystitis). This condition is usually caused by a buildup of gallstones (cholelithiasis) in the gallbladder. Gallstones can block the flow of bile, and that can result in inflammation and pain. In severe cases, emergency surgery may be required. If emergency surgery is not required, you will have time to prepare for the procedure. Laparoscopic surgery is an alternative to open surgery. Laparoscopic surgery has a shorter recovery time. Your common bile duct may also need to be examined during the procedure. If stones are found in the common bile duct, they may be removed. LET [facility] CARE PROVIDER KNOW ABOUT: Any allergies you have. All medicines you are taking,  including vitamins, herbs, eye drops, creams, and over-the-counter medicines. Previous problems you or members of your family have had with the use of anesthetics. Any blood disorders you have. Previous surgeries you have had.  Any medical conditions you have. RISKS AND COMPLICATIONS Generally, this is a safe procedure. However, problems may occur, including: Infection. Bleeding. Allergic reactions to medicines. Damage to other structures or organs. A stone remaining in the common bile duct. A bile leak from the cyst duct that is clipped when your gallbladder is removed. The need to convert to open surgery, which requires a larger incision in the abdomen. This may be necessary if your surgeon thinks that it is not safe to continue with a laparoscopic procedure. BEFORE THE PROCEDURE Ask your health care provider about: Changing or stopping your regular medicines. This is especially important if you are taking diabetes medicines or blood thinners. Taking medicines such as aspirin and ibuprofen. These medicines can thin your blood. Do not take these medicines before your procedure if your health care provider instructs you not to. Follow instructions from your health care provider about eating or drinking restrictions. Let your health care provider know if you develop a cold or an infection before surgery. Plan to have someone take you home after the procedure. Ask your health care provider how your surgical site will be marked or identified. You may be given antibiotic medicine to help prevent infection. PROCEDURE To reduce your risk of infection: Your health care team will wash or sanitize their hands. Your skin will be washed with soap. An IV  tube may be inserted into one of your veins. You will be given a medicine to make you fall asleep (general anesthetic). A breathing tube will be placed in your mouth. The surgeon will make several small cuts (incisions) in your abdomen. A thin,  lighted tube (laparoscope) that has a tiny camera on the end will be inserted through one of the small incisions. The camera on the laparoscope will send a picture to a TV screen (monitor) in the operating room. This will give the surgeon a good view inside your abdomen. A gas will be pumped into your abdomen. This will expand your abdomen to give the surgeon more room to perform the surgery. Other tools that are needed for the procedure will be inserted through the other incisions. The gallbladder will be removed through one of the incisions. After your gallbladder has been removed, the incisions will be closed with stitches (sutures), staples, or skin glue. Your incisions may be covered with a bandage (dressing). The procedure may vary among health care providers and hospitals. AFTER THE PROCEDURE Your blood pressure, heart rate, breathing rate, and blood oxygen level will be monitored often until the medicines you were given have worn off. You will be given medicines as needed to control your pain.   This information is not intended to replace advice given to you by your health care provider. Make sure you discuss any questions you have with your health care provider.   Document Released: 07/15/2005 Document Revised: 04/05/2015 Document Reviewed: 02/24/2013 Elsevier Interactive Patient Education 2016 [employer]   Low-Fat Diet for Gallbladder Conditions A low-fat diet can be helpful if you have pancreatitis or a gallbladder condition. With these conditions, your pancreas and gallbladder have trouble digesting fats. A healthy eating plan with less fat will help rest your pancreas and gallbladder and reduce your symptoms. WHAT DO I NEED TO KNOW ABOUT THIS DIET? Eat a low-fat diet. Reduce your fat intake to less than 20-30% of your total daily calories. This is less than 50-60 g of fat per day. Remember that you need some fat in your diet. Ask your dietician what your daily goal should  be. Choose nonfat and low-fat healthy foods. Look for the words "nonfat," "low fat," or "fat free." As a guide, look on the label and choose foods with less than 3 g of fat per serving. Eat only one serving. Avoid alcohol. Do not smoke. If you need help quitting, talk with your health care provider. Eat small frequent meals instead of three large heavy meals. WHAT FOODS CAN I EAT? Grains Include healthy grains and starches such as potatoes, wheat bread, fiber-rich cereal, and brown rice. Choose whole grain options whenever possible. In adults, whole grains should account for 45-65% of your daily calories.  Fruits and Vegetables Eat plenty of fruits and vegetables. Fresh fruits and vegetables add fiber to your diet. Meats and Other Protein Sources Eat lean meat such as chicken and pork. Trim any fat off of meat before cooking it. Eggs, fish, and beans are other sources of protein. In adults, these foods should account for 10-35% of your daily calories. Dairy Choose low-fat milk and dairy options. Dairy includes fat and protein, as well as calcium.  Fats and Oils Limit high-fat foods such as fried foods, sweets, baked goods, sugary drinks.  Other Creamy sauces and condiments, such as mayonnaise, can add extra fat. Think about whether or not you need to use them, or use smaller amounts or low fat options.  WHAT FOODS ARE NOT RECOMMENDED? High fat foods, such as: [employer]. Ice cream. [place] toast. Sweet rolls. Pizza. Cheese bread. Foods covered with batter, butter, creamy sauces, or cheese. Fried foods. Sugary drinks and desserts. Foods that cause gas or bloating   This information is not intended to replace advice given to you by your health care provider. Make sure you discuss any questions you have with your health care provider.   Document Released: 07/20/2013 Document Reviewed: 07/20/2013 Elsevier Interactive Patient Education [employer]

## 2023-08-04 NOTE — H&P (View-Only) (Signed)
 08/04/2023  Reason for Visit:  Biliary Dyskinesia  Requesting Provider:  Corinn Brooklyn, MD  History of Present Illness: Beverly Barrett is a 63 y.o. female presenting for evaluation of biliary dyskinesia.  The patient reports having at least 1 year long history of epigastric abdominal pain associated with nausea and vomiting.  The pain sometimes also radiates towards her back.  She reports that this can happen after eating but sometimes is also more random times.  The pain overall does not last too long for about half an hour to an hour but sometimes also less frequently can be longer.  She has been seeing Dr. Brooklyn with gastroenterology and has had multiple studies including upper endoscopy, colonoscopy, gastric emptying study, and most recently a HIDA scan.  Her HIDA scan was done on 07/14/2023 and showed a reduced ejection fraction of 22%.  Overall denies any fevers, chills, chest pain, shortness of breath.  Past Medical History: Past Medical History:  Diagnosis Date   Anginal pain (HCC)    none in approx 2 yrs   Anxiety    Asthma    Chronic lower back pain    Common migraine with intractable migraine 09/20/2016   Depression    suicidal ideation   Diabetes mellitus without complication (HCC)    GERD (gastroesophageal reflux disease)    Headache    migraines -3x/wk   Hyperlipidemia    Hypertension    Hypothyroidism    Motion sickness    cars   Multilevel degenerative disc disease    Shortness of breath dyspnea    Sleep apnea    CPAP   Vertigo    Wears dentures    partial upper     Past Surgical History: Past Surgical History:  Procedure Laterality Date   ABDOMINAL HYSTERECTOMY     bladder tach  1995   CESAREAN SECTION     COLONOSCOPY WITH PROPOFOL  N/A 05/29/2015   Procedure: COLONOSCOPY WITH PROPOFOL ;  Surgeon: Rogelia Copping, MD;  Location: [facility];  Service: Endoscopy;  Laterality: N/A;  Latex allergy sleep apnea - no CPAP machine (yet)   COLONOSCOPY WITH  PROPOFOL  N/A 12/18/2021   Procedure: COLONOSCOPY WITH PROPOFOL ;  Surgeon: Brooklyn Corinn Skiff, MD;  Location: [facility];  Service: Gastroenterology;  Laterality: N/A;   ESOPHAGOGASTRODUODENOSCOPY N/A 04/23/2017   Procedure: ESOPHAGOGASTRODUODENOSCOPY (EGD);  Surgeon: Brooklyn Corinn Skiff, MD;  Location: [facility];  Service: Gastroenterology;  Laterality: N/A;  Latex allergy sleep apnea   POLYPECTOMY  05/29/2015   Procedure: POLYPECTOMY;  Surgeon: Rogelia Copping, MD;  Location: [facility];  Service: Endoscopy;;    Home Medications: Prior to Admission medications   Medication Sig Start Date End Date Taking? Authorizing Provider  acetaminophen  (TYLENOL ) 500 MG tablet Take 2 tablets (1,000 mg total) by mouth every 6 (six) hours as needed. 08/30/19  Yes Meuth, Brooke A, PA-C  amLODipine  (NORVASC ) 2.5 MG tablet Take 1 tablet (2.5 mg total) by mouth daily. 04/04/23  Yes Sowles, Krichna, MD  aspirin  81 MG EC tablet Take 81 mg by mouth daily.   Yes [provider]  cloNIDine  (CATAPRES ) 0.1 MG tablet Take 1 tablet (0.1 mg total) by mouth at bedtime. 04/04/23  Yes Sowles, Krichna, MD  dimenhyDRINATE (DRAMAMINE) 50 MG tablet Take 50 mg by mouth every 8 (eight) hours as needed.   Yes [provider]  estradiol  (ESTRACE  VAGINAL) 0.1 MG/GM vaginal cream Apply 0.5mg  (pea-sized amount)  just inside the vaginal introitus with a finger-tip on Monday, Wednesday and  Friday nights. 06/12/20  Yes McGowan, Clotilda A, PA-C  fluticasone  (FLONASE ) 50 MCG/ACT nasal spray Place 2 sprays into both nostrils daily as needed for allergies. 04/03/22  Yes Sowles, Krichna, MD  Fluticasone  Furoate (ARNUITY ELLIPTA ) 100 MCG/ACT AEPB Take 1 puff by mouth daily at 12 noon. 07/18/23  Yes Sowles, Krichna, MD  gabapentin  (NEURONTIN ) 400 MG capsule Take 1 capsule in the morning, take 2 at night 07/08/23  Yes Gayland Lauraine PARAS, NP  Galcanezumab -gnlm (EMGALITY ) 120 MG/ML SOAJ Inject 120 mg into the skin every 30  (thirty) days. 07/08/23  Yes Gayland Lauraine PARAS, NP  glucose blood test strip Use as instructed 03/28/21  Yes Sowles, Krichna, MD  Iron-FA-B Cmp-C-Biot-Probiotic (FUSION PLUS) CAPS Take 1 capsule by mouth daily for 60 doses. 07/28/23 09/26/23 Yes [place], PA-C  Lancet Devices (ONE TOUCH DELICA LANCING DEV) MISC 1 each by Does not apply route daily. 09/20/19  Yes Sowles, Krichna, MD  levothyroxine  (SYNTHROID ) 100 MCG tablet Take 100 mcg by mouth every morning. 05/04/23  Yes [provider]  linaclotide  (LINZESS ) 145 MCG CAPS capsule Take 1 capsule (145 mcg total) by mouth daily. 07/18/23  Yes Sowles, Krichna, MD  metFORMIN  (GLUCOPHAGE -XR) 750 MG 24 hr tablet Take 2 tablets (1,500 mg total) by mouth daily with breakfast. 07/18/23  Yes Sowles, Krichna, MD  metoCLOPramide  (REGLAN ) 5 MG tablet Take 1 tablet (5 mg total) by mouth daily before supper. 08/05/22  Yes Sowles, Krichna, MD  metoprolol  succinate (TOPROL -XL) 50 MG 24 hr tablet TAKE 1 TABLET BY MOUTH ONCE A DAY WITH OR IMMEDIATELY FOLLOWING A MEAL. 07/18/23  Yes Sowles, Krichna, MD  mirtazapine  (REMERON ) 7.5 MG tablet TAKE 1 TABLET BY MOUTH AT BEDTIME 05/05/23  Yes Eappen, Saramma, MD  montelukast  (SINGULAIR ) 10 MG tablet Take 1 tablet (10 mg total) by mouth at bedtime. 07/18/23  Yes Sowles, Krichna, MD  nitroGLYCERIN  (NITROSTAT ) 0.4 MG SL tablet Place 1 tablet (0.4 mg total) under the tongue every 5 (five) minutes as needed. 05/09/23  Yes Sowles, Krichna, MD  olmesartan -hydrochlorothiazide  (BENICAR  HCT) 40-25 MG tablet Take 1 tablet by mouth daily. 07/18/23  Yes Sowles, Krichna, MD  omeprazole  (PRILOSEC) 40 MG capsule Take 1 capsule by mouth once daily 05/12/23  Yes Vanga, Rohini Reddy, MD  ondansetron  (ZOFRAN ) 4 MG tablet Take 1 tablet (4 mg total) by mouth every 8 (eight) hours as needed for nausea or vomiting. 07/28/23 10/26/23 Yes [place], PA-C  OneTouch Delica Lancets 33G MISC 1 each by Does not apply route daily. 03/28/21  Yes Sowles,  Krichna, MD  pioglitazone  (ACTOS ) 15 MG tablet Take 1 tablet (15 mg total) by mouth daily. 07/18/23  Yes Sowles, Krichna, MD  rizatriptan  (MAXALT ) 10 MG tablet May repeat in 2 hours if needed 07/08/23  Yes Gayland Lauraine PARAS, NP  rosuvastatin  (CRESTOR ) 40 MG tablet TAKE 1 TABLET BY MOUTH ONCE DAILY. IN PLACE OF ATORVASTATIN  04/04/23  Yes Sowles, Krichna, MD  VENTOLIN  HFA 108 (90 Base) MCG/ACT inhaler INHALE 2 PUFFS BY MOUTH EVERY 6 HOURS AS NEEDED FOR WHEEZING OR SHORTNESS OF BREATH 07/01/23  Yes Sowles, Krichna, MD    Allergies: Allergies  Allergen Reactions   Latex Swelling    Pt unsure of reaction pt states something happened while in the hospital after surgery.    Social History:  reports that she has never smoked. She has never used smokeless tobacco. She reports that she does not drink alcohol and does not use drugs.   Family History: Family History  Problem  Relation Age of Onset   Diabetes Sister    Anxiety disorder Sister    Depression Sister    Hypertension Sister    Cirrhosis Mother    Diabetes Mother    Cirrhosis Father    Breast cancer Sister 68   Heart attack Brother    Alcohol abuse Brother    Drug abuse Brother    Prostate cancer Neg Hx    Kidney cancer Neg Hx    Bladder Cancer Neg Hx     Review of Systems: Review of Systems  Constitutional:  Negative for chills and fever.  HENT:  Negative for hearing loss.   Respiratory:  Negative for shortness of breath.   Cardiovascular:  Negative for chest pain.  Gastrointestinal:  Positive for abdominal pain, constipation, nausea and vomiting. Negative for diarrhea.  Genitourinary:  Negative for dysuria.  Musculoskeletal:  Negative for myalgias.  Skin:  Negative for rash.  Neurological:  Negative for dizziness.  Psychiatric/Behavioral:  Negative for depression.     Physical Exam BP 137/88   Pulse 85   Temp 98.5 F (36.9 C) (Oral)   Ht 5' 1 (1.549 m)   Wt 226 lb (102.5 kg)   SpO2 99%   BMI 42.70 kg/m   CONSTITUTIONAL: No acute distress HEENT:  Normocephalic, atraumatic, extraocular motion intact. NECK: Trachea is midline, and there is no jugular venous distension.  RESPIRATORY:  Lungs are clear, and breath sounds are equal bilaterally. Normal respiratory effort without pathologic use of accessory muscles. CARDIOVASCULAR: Heart is regular without murmurs, gallops, or rubs. GI: The abdomen is of, nondistended, with mild discomfort to palpation in the epigastric and right upper quadrant areas.  Negative Murphy's sign.  MUSCULOSKELETAL:  Normal muscle strength and tone in all four extremities.  No peripheral edema or cyanosis. SKIN: Skin turgor is normal. There are no pathologic skin lesions.  NEUROLOGIC:  Motor and sensation is grossly normal.  Cranial nerves are grossly intact. PSYCH:  Alert and oriented to person, place and time. Affect is normal.  Laboratory Analysis: Labs from 04/04/2023: Sodium 143, potassium 3.9, chloride 104, CO2 30, BUN 12, creatinine 0.52.  Total bilirubin 0.4, AST 11, ALT 7, alkaline phosphatase 53.  WBC 7.6, hemoglobin 11.9, hematocrit 36.7, platelets 276.  Hemoglobin A1c 6.0  Imaging: HIDA scan on 07/14/2023: FINDINGS: Prompt uptake and biliary excretion of activity by the liver is seen. Gallbladder activity is visualized, consistent with patency of cystic duct. Biliary activity passes into small bowel, consistent with patent common bile duct.   Calculated gallbladder ejection fraction is 22%. (Normal gallbladder ejection fraction with Ensure is greater than 33% and less than 80%.)   IMPRESSION: Reduced gallbladder ejection fraction as can be seen with chronic cholecystitis/biliary dyskinesia.  Gastric emptying study on 07/13/2021: IMPRESSION: Normal gastric emptying study.   Assessment and Plan: This is a 63 y.o. female with biliary dyskinesia.  - Discussed with the patient the findings on her HIDA scan showing biliary dyskinesia with a reduced ejection  fraction of 22%.  Discussed with the patient how the gallbladder works and the reasons for potential biliary colic which could be related to cholelithiasis or biliary dyskinesia.  Discussed with the patient that in her case, her gallbladder is not pumping well enough which is causing her symptoms.  Discussed with the patient unfortunately there is no medication or diet that she could fall to improve the pumping function of the gallbladder and the recommendation would be for surgical management in the form of cholecystectomy.  She  is in agreement. - Discussed with patient then the plan for robotic assisted cholecystectomy and reviewed with her the surgery at length including the planned incisions, the risks of bleeding, infection, injury to surrounding structures, the use of ICG to better evaluate the biliary anatomy, that this will be an outpatient procedure, postoperative activity restrictions, pain control, and she is willing to proceed. - We will tentatively schedule the patient for next week on 08/12/2023 in the afternoon.  As a precaution, we will send for medical clearance.  The patient is aware that depending on any additional testing that may be needed, this may delay her surgery date.  She currently takes aspirin  81 mg daily and we will have her hold this with the last dose on 08/06/2023. - Patient understands this plan and all of her questions have been answered.  I spent 55 minutes dedicated to the care of this patient on the date of this encounter to include pre-visit review of records, face-to-face time with the patient discussing diagnosis and management, and any post-visit coordination of care.   Aloysius Sheree Plant, MD [facility]

## 2023-08-04 NOTE — Telephone Encounter (Signed)
Faxed medical clearance to Dr. Steele Sizer at [phone].

## 2023-08-04 NOTE — Telephone Encounter (Signed)
 Patient has been advised of Pre-Admission date/time, and Surgery date at [facility].  Surgery Date: 08/12/23 Preadmission Testing Date: 08/08/23 (phone 8a-1p)  Patient has been made aware to call [phone], between 1-3:00pm the day before surgery, to find out what time to arrive for surgery.

## 2023-08-07 ENCOUNTER — Ambulatory Visit: Payer: Commercial Managed Care - HMO | Admitting: Anesthesiology

## 2023-08-07 ENCOUNTER — Ambulatory Visit
Admission: RE | Admit: 2023-08-07 | Discharge: 2023-08-07 | Disposition: A | Discharge status: Home / Self Care | Payer: Commercial Managed Care - HMO | Attending: Gastroenterology | Admitting: Gastroenterology

## 2023-08-07 ENCOUNTER — Encounter
Admission: RE | Disposition: A | Discharge status: Home / Self Care | Payer: Self-pay | Source: Home / Self Care | Attending: Gastroenterology

## 2023-08-07 DIAGNOSIS — Z6841 Body Mass Index (BMI) 40.0 and over, adult: Secondary | ICD-10-CM | POA: Diagnosis not present

## 2023-08-07 DIAGNOSIS — E66813 Obesity, class 3: Secondary | ICD-10-CM | POA: Insufficient documentation

## 2023-08-07 DIAGNOSIS — K219 Gastro-esophageal reflux disease without esophagitis: Secondary | ICD-10-CM | POA: Diagnosis not present

## 2023-08-07 DIAGNOSIS — K2960 Other gastritis without bleeding: Secondary | ICD-10-CM | POA: Diagnosis not present

## 2023-08-07 DIAGNOSIS — K319 Disease of stomach and duodenum, unspecified: Secondary | ICD-10-CM

## 2023-08-07 DIAGNOSIS — I10 Essential (primary) hypertension: Secondary | ICD-10-CM | POA: Diagnosis not present

## 2023-08-07 DIAGNOSIS — Z7984 Long term (current) use of oral hypoglycemic drugs: Secondary | ICD-10-CM | POA: Diagnosis not present

## 2023-08-07 DIAGNOSIS — K2970 Gastritis, unspecified, without bleeding: Secondary | ICD-10-CM

## 2023-08-07 DIAGNOSIS — I2510 Atherosclerotic heart disease of native coronary artery without angina pectoris: Secondary | ICD-10-CM | POA: Diagnosis not present

## 2023-08-07 DIAGNOSIS — K635 Polyp of colon: Secondary | ICD-10-CM | POA: Diagnosis not present

## 2023-08-07 DIAGNOSIS — G43019 Migraine without aura, intractable, without status migrainosus: Secondary | ICD-10-CM | POA: Diagnosis not present

## 2023-08-07 DIAGNOSIS — Z5986 Financial insecurity: Secondary | ICD-10-CM | POA: Diagnosis not present

## 2023-08-07 DIAGNOSIS — D509 Iron deficiency anemia, unspecified: Secondary | ICD-10-CM | POA: Diagnosis present

## 2023-08-07 DIAGNOSIS — G4730 Sleep apnea, unspecified: Secondary | ICD-10-CM | POA: Diagnosis not present

## 2023-08-07 DIAGNOSIS — E119 Type 2 diabetes mellitus without complications: Secondary | ICD-10-CM | POA: Insufficient documentation

## 2023-08-07 DIAGNOSIS — D123 Benign neoplasm of transverse colon: Secondary | ICD-10-CM | POA: Insufficient documentation

## 2023-08-07 DIAGNOSIS — J45909 Unspecified asthma, uncomplicated: Secondary | ICD-10-CM | POA: Insufficient documentation

## 2023-08-07 DIAGNOSIS — D124 Benign neoplasm of descending colon: Secondary | ICD-10-CM | POA: Insufficient documentation

## 2023-08-07 DIAGNOSIS — K5730 Diverticulosis of large intestine without perforation or abscess without bleeding: Secondary | ICD-10-CM | POA: Insufficient documentation

## 2023-08-07 DIAGNOSIS — K828 Other specified diseases of gallbladder: Secondary | ICD-10-CM

## 2023-08-07 HISTORY — PX: BIOPSY: SHX5522

## 2023-08-07 HISTORY — PX: ESOPHAGOGASTRODUODENOSCOPY (EGD) WITH PROPOFOL: SHX5813

## 2023-08-07 HISTORY — PX: COLONOSCOPY WITH PROPOFOL: SHX5780

## 2023-08-07 HISTORY — PX: POLYPECTOMY: SHX5525

## 2023-08-07 LAB — GLUCOSE, CAPILLARY: Glucose-Capillary: 114 mg/dL — ABNORMAL HIGH (ref 70–99)

## 2023-08-07 SURGERY — COLONOSCOPY WITH PROPOFOL
Anesthesia: General

## 2023-08-07 MED ORDER — ALBUTEROL SULFATE HFA 108 (90 BASE) MCG/ACT IN AERS
INHALATION_SPRAY | RESPIRATORY_TRACT | Status: DC | PRN
  Administered 2023-08-07: 6 via RESPIRATORY_TRACT

## 2023-08-07 MED ORDER — PHENYLEPHRINE 80 MCG/ML (10ML) SYRINGE FOR IV PUSH (FOR BLOOD PRESSURE SUPPORT)
PREFILLED_SYRINGE | INTRAVENOUS | Status: DC | PRN
  Administered 2023-08-07 (×2): 240 ug via INTRAVENOUS
  Administered 2023-08-07 (×2): 160 ug via INTRAVENOUS

## 2023-08-07 MED ORDER — PROPOFOL 10 MG/ML IV BOLUS
INTRAVENOUS | Status: DC | PRN
  Administered 2023-08-07: 50 mg via INTRAVENOUS
  Administered 2023-08-07: 20 mg via INTRAVENOUS
  Administered 2023-08-07: 10 mg via INTRAVENOUS
  Administered 2023-08-07: 20 mg via INTRAVENOUS

## 2023-08-07 MED ORDER — LIDOCAINE HCL (CARDIAC) PF 100 MG/5ML IV SOSY
PREFILLED_SYRINGE | INTRAVENOUS | Status: DC | PRN
  Administered 2023-08-07: 100 mg via INTRAVENOUS

## 2023-08-07 MED ORDER — GLYCOPYRROLATE 0.2 MG/ML IJ SOLN
INTRAMUSCULAR | Status: DC | PRN
  Administered 2023-08-07: .2 mg via INTRAVENOUS

## 2023-08-07 MED ORDER — INDOCYANINE GREEN 25 MG IV SOLR
1.2500 mg | INTRAVENOUS | Status: DC
Start: 2023-08-07 — End: 2023-08-07
  Filled 2023-08-07: qty 10

## 2023-08-07 MED ORDER — DEXMEDETOMIDINE HCL IN NACL 80 MCG/20ML IV SOLN
INTRAVENOUS | Status: DC | PRN
  Administered 2023-08-07: 8 ug via INTRAVENOUS
  Administered 2023-08-07: 12 ug via INTRAVENOUS

## 2023-08-07 MED ORDER — SODIUM CHLORIDE 0.9 % IV SOLN: INTRAVENOUS | Status: DC

## 2023-08-07 MED ORDER — EPHEDRINE SULFATE-NACL 50-0.9 MG/10ML-% IV SOSY
PREFILLED_SYRINGE | INTRAVENOUS | Status: DC | PRN
  Administered 2023-08-07: 15 mg via INTRAVENOUS
  Administered 2023-08-07: 10 mg via INTRAVENOUS

## 2023-08-07 MED ORDER — PROPOFOL 500 MG/50ML IV EMUL
INTRAVENOUS | Status: DC | PRN
  Administered 2023-08-07: 125 ug/kg/min via INTRAVENOUS

## 2023-08-07 NOTE — Transfer of Care (Signed)
 Immediate Anesthesia Transfer of Care Note  Patient: Beverly Barrett  Procedure(s) Performed: COLONOSCOPY WITH PROPOFOL  ESOPHAGOGASTRODUODENOSCOPY (EGD) WITH PROPOFOL  BIOPSY POLYPECTOMY  Patient Location: PACU  Anesthesia Type:General  Level of Consciousness: sedated  Airway & Oxygen Therapy: Patient Spontanous Breathing  Post-op Assessment: Report given to RN and Post -op Vital signs reviewed and stable  Post vital signs: Reviewed  Last Vitals:  Vitals Value Taken Time  BP 106/72 08/07/23 1126  Temp 36.1 C 08/07/23 1126  Pulse 89 08/07/23 1127  Resp 17 08/07/23 1126  SpO2 98 % 08/07/23 1127  Vitals shown include unfiled device data.  Last Pain:  Vitals:   08/07/23 1126  TempSrc: Temporal  PainSc: 0-No pain         Complications: No notable events documented.

## 2023-08-07 NOTE — H&P (Signed)
 Beverly JONELLE Brooklyn, MD [address]  [address]  [place], [place] [address]  Main: [phone]  Fax: [phone] Pager: [phone]  Primary Care Physician:  Beverly Mire, MD Primary Gastroenterologist:  Dr. Corinn JONELLE Barrett  Pre-Procedure History & Physical: HPI:  Beverly Barrett is a 63 y.o. female is here for an endoscopy and colonoscopy.   Past Medical History:  Diagnosis Date   Anginal pain (HCC)    none in approx 2 yrs   Anxiety    Asthma    Chronic lower back pain    Common migraine with intractable migraine 09/20/2016   Depression    suicidal ideation   Diabetes mellitus without complication (HCC)    GERD (gastroesophageal reflux disease)    Headache    migraines -3x/wk   Hyperlipidemia    Hypertension    Hypothyroidism    Motion sickness    cars   Multilevel degenerative disc disease    Shortness of breath dyspnea    Sleep apnea    CPAP   Vertigo    Wears dentures    partial upper    Past Surgical History:  Procedure Laterality Date   ABDOMINAL HYSTERECTOMY     bladder tach  1995   CESAREAN SECTION     COLONOSCOPY WITH PROPOFOL  N/A 05/29/2015   Procedure: COLONOSCOPY WITH PROPOFOL ;  Surgeon: Beverly Copping, MD;  Location: [facility];  Service: Endoscopy;  Laterality: N/A;  Latex allergy sleep apnea - no CPAP machine (yet)   COLONOSCOPY WITH PROPOFOL  N/A 12/18/2021   Procedure: COLONOSCOPY WITH PROPOFOL ;  Surgeon: [name], MD;  Location: [facility] END[facility];  Service: Gastroenterology;  Laterality: N/A;   ESOPHAGOGASTRODUODENOSCOPY N/A 04/23/2017   Procedure: ESOPHAGOGASTRODUODENOSCOPY (EGD);  Surgeon: [name], MD;  Location: [facility];  Service: Gastroenterology;  Laterality: N/A;  Latex allergy sleep apnea   POLYPECTOMY  05/29/2015   Procedure: POLYPECTOMY;  Surgeon: Beverly Copping, MD;  Location: [facility];  Service: Endoscopy;;    Prior to Admission medications   Medication Sig Start Date End Date  Taking? Authorizing Provider  amLODipine  (NORVASC ) 2.5 MG tablet Take 1 tablet (2.5 mg total) by mouth daily. 04/04/23  Yes Sowles, Krichna, MD  cloNIDine  (CATAPRES ) 0.1 MG tablet Take 1 tablet (0.1 mg total) by mouth at bedtime. 04/04/23  Yes Sowles, Krichna, MD  Fluticasone  Furoate (ARNUITY ELLIPTA ) 100 MCG/ACT AEPB Take 1 puff by mouth daily at 12 noon. 07/18/23  Yes Sowles, Krichna, MD  gabapentin  (NEURONTIN ) 400 MG capsule Take 1 capsule in the morning, take 2 at night 07/08/23  Yes Gayland Lauraine PARAS, NP  levothyroxine  (SYNTHROID ) 100 MCG tablet Take 100 mcg by mouth daily before breakfast. 05/04/23  Yes [provider]  loratadine (CLARITIN) 10 MG tablet Take 10 mg by mouth daily.   Yes [provider]  metFORMIN  (GLUCOPHAGE -XR) 750 MG 24 hr tablet Take 2 tablets (1,500 mg total) by mouth daily with breakfast. 07/18/23  Yes Sowles, Krichna, MD  metoCLOPramide  (REGLAN ) 5 MG tablet Take 1 tablet (5 mg total) by mouth daily before supper. Patient taking differently: Take 5 mg by mouth daily as needed for nausea or vomiting. Takes before supper if needed. 08/05/22  Yes Sowles, Krichna, MD  metoprolol  succinate (TOPROL -XL) 50 MG 24 hr tablet TAKE 1 TABLET BY MOUTH ONCE A DAY WITH OR IMMEDIATELY FOLLOWING A MEAL. 07/18/23  Yes Sowles, Krichna, MD  mirtazapine  (REMERON ) 7.5 MG tablet TAKE 1 TABLET BY MOUTH AT BEDTIME 05/05/23  Yes Eappen, Saramma,  MD  montelukast  (SINGULAIR ) 10 MG tablet Take 1 tablet (10 mg total) by mouth at bedtime. 07/18/23  Yes Sowles, Krichna, MD  olmesartan -hydrochlorothiazide  (BENICAR  HCT) 40-25 MG tablet Take 1 tablet by mouth daily. 07/18/23  Yes Sowles, Krichna, MD  omeprazole  (PRILOSEC) 40 MG capsule Take 1 capsule by mouth once daily 05/12/23  Yes Beverly Elson Reddy, MD  pioglitazone  (ACTOS ) 15 MG tablet Take 1 tablet (15 mg total) by mouth daily. 07/18/23  Yes Sowles, Krichna, MD  rizatriptan  (MAXALT ) 10 MG tablet May repeat in 2 hours if needed Patient taking  differently: Take 10 mg by mouth as needed for migraine. May repeat in 2 hours if needed 07/08/23  Yes Gayland Lauraine PARAS, NP  rosuvastatin  (CRESTOR ) 40 MG tablet TAKE 1 TABLET BY MOUTH ONCE DAILY. IN PLACE OF ATORVASTATIN  04/04/23  Yes Sowles, Krichna, MD  acetaminophen  (TYLENOL ) 500 MG tablet Take 500-1,000 mg by mouth every 6 (six) hours as needed for mild pain (pain score 1-3) or headache.    [provider]  aspirin  81 MG EC tablet Take 81 mg by mouth daily.    [provider]  Cholecalciferol (VITAMIN D ) 50 MCG (2000 UT) CAPS Take 2,000 Units by mouth daily.    [provider]  dimenhyDRINATE (DRAMAMINE) 50 MG tablet Take 50 mg by mouth every 8 (eight) hours as needed for nausea.    [provider]  fluticasone  (FLONASE ) 50 MCG/ACT nasal spray Place 2 sprays into both nostrils daily as needed for allergies. 04/03/22   Sowles, Krichna, MD  Galcanezumab -gnlm (EMGALITY ) 120 MG/ML SOAJ Inject 120 mg into the skin every 30 (thirty) days. 07/08/23   Gayland Lauraine PARAS, NP  glucose blood test strip Use as instructed 03/28/21   Sowles, Krichna, MD  Iron-FA-B Cmp-C-Biot-Probiotic (FUSION PLUS) CAPS Take 1 capsule by mouth daily for 60 doses. 07/28/23 09/26/23  [place], PA-C  Lancet Devices (ONE TOUCH DELICA LANCING DEV) MISC 1 each by Does not apply route daily. 09/20/19   Sowles, Krichna, MD  linaclotide  (LINZESS ) 145 MCG CAPS capsule Take 1 capsule (145 mcg total) by mouth daily. 07/18/23   Sowles, Krichna, MD  nitroGLYCERIN  (NITROSTAT ) 0.4 MG SL tablet Place 1 tablet (0.4 mg total) under the tongue every 5 (five) minutes as needed. 05/09/23   Sowles, Krichna, MD  ondansetron  (ZOFRAN ) 4 MG tablet Take 1 tablet (4 mg total) by mouth every 8 (eight) hours as needed for nausea or vomiting. 07/28/23 10/26/23  [place], PA-C  OneTouch Delica Lancets 33G MISC 1 each by Does not apply route daily. 03/28/21   Sowles, Krichna, MD  VENTOLIN  HFA 108 (90 Base) MCG/ACT inhaler INHALE 2  PUFFS BY MOUTH EVERY 6 HOURS AS NEEDED FOR WHEEZING OR SHORTNESS OF BREATH 07/01/23   Sowles, Krichna, MD    Allergies as of 07/02/2023 - Review Complete 07/02/2023  Allergen Reaction Noted   Latex Swelling 04/24/2015    Family History  Problem Relation Age of Onset   Diabetes Sister    Anxiety disorder Sister    Depression Sister    Hypertension Sister    Cirrhosis Mother    Diabetes Mother    Cirrhosis Father    Breast cancer Sister 50   Heart attack Brother    Alcohol abuse Brother    Drug abuse Brother    Prostate cancer Neg Hx    Kidney cancer Neg Hx    Bladder Cancer Neg Hx     Social History   Socioeconomic History  Marital status: Married    Spouse name: training and development officer   Number of children: 3   Years of education: Not on file   Highest education level: GED or equivalent  Occupational History   Not on file  Tobacco Use   Smoking status: Never   Smokeless tobacco: Never  Vaping Use   Vaping status: Never Used  Substance and Sexual Activity   Alcohol use: No    Alcohol/week: 0.0 standard drinks of alcohol   Drug use: No   Sexual activity: Not Currently    Partners: Male  Other Topics Concern   Not on file  Social History Narrative   Lives with husband and one daughter other kids are out of the house   She does not work, husband on disability    Social Drivers of Health   Financial Resource Strain: High Risk (03/06/2020)   Overall Financial Resource Strain (CARDIA)    Difficulty of Paying Living Expenses: Very hard  Food Insecurity: Food Insecurity Present (07/14/2019)   Hunger Vital Sign    Worried About Running Out of Food in the Last Year: Sometimes true    Ran Out of Food in the Last Year: Sometimes true  Transportation Needs: No Transportation Needs (07/14/2023)   PRAPARE - Administrator, Civil Service (Medical): No    Lack of Transportation (Non-Medical): No  Physical Activity: Unknown (07/14/2023)   Exercise Vital Sign    Days of  Exercise per Week: 2 days    Minutes of Exercise per Session: Not on file  Stress: Stress Concern Present (07/14/2019)   [employer] of Occupational Health - Occupational Stress Questionnaire    Feeling of Stress : To some extent  Social Connections: Moderately Integrated (07/14/2023)   Social Connection and Isolation Panel [NHANES]    Frequency of Communication with Friends and Family: Three times a week    Frequency of Social Gatherings with Friends and Family: Twice a week    Attends Religious Services: More than 4 times per year    Active Member of [employer] or Organizations: No    Attends Engineer, Structural: Not on file    Marital Status: Married  Catering Manager Violence: Not At Risk (07/14/2019)   Humiliation, Afraid, Rape, and Kick questionnaire    Fear of Current or Ex-Partner: No    Emotionally Abused: No    Physically Abused: No    Sexually Abused: No    Review of Systems: See HPI, otherwise negative ROS  Physical Exam: BP (!) 185/96   Pulse 75   Temp (!) 97 F (36.1 C) (Temporal)   Resp 18   Ht 5' 1 (1.549 m)   Wt 101.6 kg   SpO2 100%   BMI 42.32 kg/m  General:   Alert,  pleasant and cooperative in NAD Head:  Normocephalic and atraumatic. Neck:  Supple; no masses or thyromegaly. Lungs:  Clear throughout to auscultation.    Heart:  Regular rate and rhythm. Abdomen:  Soft, nontender and nondistended. Normal bowel sounds, without guarding, and without rebound.   Neurologic:  Alert and  oriented x4;  grossly normal neurologically.  Impression/Plan: Beverly Barrett is here for an endoscopy and colonoscopy to be performed for IDA  Risks, benefits, limitations, and alternatives regarding  endoscopy and colonoscopy have been reviewed with the patient.  Questions have been answered.  All parties agreeable.   Beverly Brooklyn, MD  08/07/2023, 10:02 AM

## 2023-08-07 NOTE — Op Note (Signed)
 [facility] Gastroenterology Patient Name: Beverly Barrett Procedure Date: 08/07/2023 10:31 AM MRN: [id number] Account #: [id number] Date of Birth: Jul 28, 1961 Admit Type: Outpatient Age: 63 Room: [facility] ENDO ROOM 3 Gender: Female Note Status: Finalized Instrument Name: Barnie Endoscope [id number] Procedure:             Upper GI endoscopy Indications:           Unexplained iron deficiency anemia Providers:             Corinn Jess Brooklyn MD, MD Referring MD:          Dorette FALCON. Sowles, MD (Referring MD) Medicines:             General Anesthesia Complications:         No immediate complications. Estimated blood loss: None. Procedure:             Pre-Anesthesia Assessment:                        - Prior to the procedure, a History and Physical was                         performed, and patient medications and allergies were                         reviewed. The patient is competent. The risks and                         benefits of the procedure and the sedation options and                         risks were discussed with the patient. All questions                         were answered and informed consent was obtained.                         Patient identification and proposed procedure were                         verified by the physician, the nurse, the                         anesthesiologist, the anesthetist and the technician                         in the pre-procedure area in the procedure room in the                         endoscopy suite. Mental Status Examination: alert and                         oriented. Airway Examination: normal oropharyngeal                         airway and neck mobility. Respiratory Examination:                         clear to auscultation. CV Examination: normal.  Prophylactic Antibiotics: The patient does not require                         prophylactic antibiotics. Prior Anticoagulants: The                          patient has taken no anticoagulant or antiplatelet                         agents. ASA Grade Assessment: III - A patient with                         severe systemic disease. After reviewing the risks and                         benefits, the patient was deemed in satisfactory                         condition to undergo the procedure. The anesthesia                         plan was to use general anesthesia. Immediately prior                         to administration of medications, the patient was                         re-assessed for adequacy to receive sedatives. The                         heart rate, respiratory rate, oxygen saturations,                         blood pressure, adequacy of pulmonary ventilation, and                         response to care were monitored throughout the                         procedure. The physical status of the patient was                         re-assessed after the procedure.                        After obtaining informed consent, the endoscope was                         passed under direct vision. Throughout the procedure,                         the patient's blood pressure, pulse, and oxygen                         saturations were monitored continuously. The Endoscope                         was introduced through the mouth, and advanced to the  second part of duodenum. The upper GI endoscopy was                         accomplished without difficulty. The patient tolerated                         the procedure well. Findings:      The duodenal bulb and second portion of the duodenum were normal.      Diffuse mildly erythematous mucosa without bleeding was found in the       gastric antrum. Biopsies were taken with a cold forceps for histology.      The gastric body and incisura were normal. Biopsies were taken with a       cold forceps for histology.      The cardia and gastric fundus were normal on  retroflexion.      The gastroesophageal junction and examined esophagus were normal. Impression:            - Normal duodenal bulb and second portion of the                         duodenum.                        - Erythematous mucosa in the antrum. Biopsied.                        - Normal gastric body and incisura. Biopsied.                        - Normal gastroesophageal junction and esophagus. Recommendation:        - Await pathology results.                        - Proceed with colonoscopy as scheduled                        See colonoscopy report Procedure Code(s):     --- Professional ---                        [phone], Esophagogastroduodenoscopy, flexible,                         transoral; with biopsy, single or multiple Diagnosis Code(s):     --- Professional ---                        K31.89, Other diseases of stomach and duodenum                        D50.9, Iron deficiency anemia, unspecified CPT copyright 2022 American Medical Association. All rights reserved. The codes documented in this report are preliminary and upon coder review may  be revised to meet current compliance requirements. Dr. Angelita Brooklyn Corinn Jess Brooklyn MD, MD 08/07/2023 11:01:13 AM This report has been signed electronically. Number of Addenda: 0 Note Initiated On: 08/07/2023 10:31 AM Estimated Blood Loss:  Estimated blood loss: none.      [facility]

## 2023-08-07 NOTE — Discharge Instructions (Signed)
    Corinn JONELLE Brooklyn, MD [address]  [address]  [place], [place] [address]  Main: [phone]  Fax: [phone] Pager: [phone]   YOU HAD AN ENDOSCOPIC PROCEDURE TODAY: Refer to the procedure report that was given to you for any specific questions about what was found during the examination.  If the procedure report does not answer your questions, please call your gastroenterologist to clarify.  YOU SHOULD EXPECT: Some feelings of bloating in the abdomen. Passage of more gas than usual.  Walking can help get rid of the air that was put into your GI tract during the procedure and reduce the bloating. If you had a lower endoscopy (such as a colonoscopy or flexible sigmoidoscopy) you may notice spotting of blood in your stool or on the toilet paper.   DIET: Your first meal following the procedure should be a light meal and then it is ok to progress to your normal diet.  A half-sandwich or bowl of soup is an example of a good first meal.  Heavy or fried foods are harder to digest and may make you feel nasueas or bloated.  Drink plenty of fluids but you should avoid alcoholic beverages for 24 hours.  ACTIVITY: Your care partner should take you home directly after the procedure.  You should plan to take it easy, moving slowly for the rest of the day.  You can resume normal activity the day after the procedure however you should NOT DRIVE, make legal decisions or use heavy machinery for 24 hours (because of the sedation medicines used during the test).    SYMPTOMS TO REPORT IMMEDIATELY  A gastroenterologist can be reached at any hour.  Please call your doctor's office for any of the following symptoms:  Following lower endoscopy (colonoscopy, flexible sigmoidoscopy)  Excessive amounts of blood in the stool  Significant tenderness, worsening of abdominal pains  Swelling of the abdomen that is new, acute  Fever of 100 or higher Following upper endoscopy (EGD, EUS, ERCP)  Vomiting of  blood or coffee ground material  New, significant abdominal pain  New, significant chest pain or pain under the shoulder blades  Painful or persistently difficult swallowing  New shortness of breath  Black, tarry-looking stools  FOLLOW UP: If any biopsies were taken you will be contacted by phone or by letter within the next 1-3 weeks.  Call your gastroenterologist if you have not heard about the biopsies in 3 weeks.   Please also call your gastroenterologist's office with any specific questions about appointments or follow up tests.

## 2023-08-07 NOTE — Anesthesia Postprocedure Evaluation (Signed)
 Anesthesia Post Note  Patient: Deneise Getty  Procedure(s) Performed: COLONOSCOPY WITH PROPOFOL  ESOPHAGOGASTRODUODENOSCOPY (EGD) WITH PROPOFOL  BIOPSY POLYPECTOMY  Patient location during evaluation: Endoscopy Anesthesia Type: General Level of consciousness: awake and alert Pain management: pain level controlled Vital Signs Assessment: post-procedure vital signs reviewed and stable Respiratory status: spontaneous breathing, nonlabored ventilation, respiratory function stable and patient connected to nasal cannula oxygen Cardiovascular status: blood pressure returned to baseline and stable Postop Assessment: no apparent nausea or vomiting Anesthetic complications: no   No notable events documented.   Last Vitals:  Vitals:   08/07/23 1136 08/07/23 1144  BP: 126/71 (!) 142/96  Pulse:  68  Resp:    Temp:    SpO2:      Last Pain:  Vitals:   08/07/23 1146  TempSrc:   PainSc: 0-No pain                 Fairy POUR Karmelo Bass

## 2023-08-07 NOTE — Op Note (Signed)
 [facility] Gastroenterology Patient Name: Beverly Barrett Procedure Date: 08/07/2023 10:31 AM MRN: [id number] Account #: [id number] Date of Birth: 1960/11/29 Admit Type: Outpatient Age: 63 Room: [facility] ENDO ROOM 3 Gender: Female Note Status: Finalized Instrument Name: Colonoscope [id number] Procedure:             Colonoscopy Indications:           Last colonoscopy: May 2023, Unexplained iron                         deficiency anemia Providers:             Corinn Jess Brooklyn MD, MD Referring MD:          Dorette FALCON. Sowles, MD (Referring MD) Medicines:             General Anesthesia Complications:         No immediate complications. Estimated blood loss: None. Procedure:             Pre-Anesthesia Assessment:                        - Prior to the procedure, a History and Physical was                         performed, and patient medications and allergies were                         reviewed. The patient is competent. The risks and                         benefits of the procedure and the sedation options and                         risks were discussed with the patient. All questions                         were answered and informed consent was obtained.                         Patient identification and proposed procedure were                         verified by the physician, the nurse, the                         anesthesiologist, the anesthetist and the technician                         in the pre-procedure area in the procedure room in the                         endoscopy suite. Mental Status Examination: alert and                         oriented. Airway Examination: normal oropharyngeal                         airway and neck mobility. Respiratory Examination:  clear to auscultation. CV Examination: normal.                         Prophylactic Antibiotics: The patient does not require                         prophylactic antibiotics.  Prior Anticoagulants: The                         patient has taken no anticoagulant or antiplatelet                         agents. ASA Grade Assessment: III - A patient with                         severe systemic disease. After reviewing the risks and                         benefits, the patient was deemed in satisfactory                         condition to undergo the procedure. The anesthesia                         plan was to use general anesthesia. Immediately prior                         to administration of medications, the patient was                         re-assessed for adequacy to receive sedatives. The                         heart rate, respiratory rate, oxygen saturations,                         blood pressure, adequacy of pulmonary ventilation, and                         response to care were monitored throughout the                         procedure. The physical status of the patient was                         re-assessed after the procedure.                        After obtaining informed consent, the colonoscope was                         passed under direct vision. Throughout the procedure,                         the patient's blood pressure, pulse, and oxygen                         saturations were monitored continuously. The  Colonoscope was introduced through the anus and                         advanced to the the cecum, identified by appendiceal                         orifice and ileocecal valve. The colonoscopy was                         performed without difficulty. The patient tolerated                         the procedure well. The quality of the bowel                         preparation was evaluated using the BBPS [facility] Bowel                         Preparation Scale) with scores of: Right Colon = 3,                         Transverse Colon = 3 and Left Colon = 3 (entire mucosa                         seen well with no  residual staining, small fragments                         of stool or opaque liquid). The total BBPS score                         equals 9. The ileocecal valve, appendiceal orifice,                         and rectum were photographed. Findings:      The perianal and digital rectal examinations were normal. Pertinent       negatives include normal sphincter tone and no palpable rectal lesions.      Eight sessile polyps were found in the sigmoid colon 1, descending colon       2 and transverse colon 5. The polyps were 3 to 5 mm in size. These       polyps were removed with a cold snare. Resection and retrieval were       complete. Estimated blood loss: none.      The retroflexed view of the distal rectum and anal verge was normal and       showed no anal or rectal abnormalities.      Multiple diverticula were found in the recto-sigmoid colon and sigmoid       colon. Impression:            - Eight 3 to 5 mm polyps in the sigmoid colon, in the                         descending colon and in the transverse colon, removed                         with a cold snare. Resected and retrieved.                        -  The distal rectum and anal verge are normal on                         retroflexion view.                        - Diverticulosis in the recto-sigmoid colon and in the                         sigmoid colon. Recommendation:        - Discharge patient to home (with escort).                        - Resume previous diet today.                        - Continue present medications.                        - Await pathology results.                        - Repeat colonoscopy in 3 years for surveillance of                         multiple polyps. Procedure Code(s):     --- Professional ---                        [phone], Colonoscopy, flexible; with removal of                         tumor(s), polyp(s), or other lesion(s) by snare                         technique Diagnosis Code(s):      --- Professional ---                        D12.5, Benign neoplasm of sigmoid colon                        D12.4, Benign neoplasm of descending colon                        D12.3, Benign neoplasm of transverse colon (hepatic                         flexure or splenic flexure)                        D50.9, Iron deficiency anemia, unspecified                        K57.30, Diverticulosis of large intestine without                         perforation or abscess without bleeding CPT copyright 2022 American Medical Association. All rights reserved. The codes documented in this report are preliminary and upon coder review may  be revised to meet current compliance requirements. Dr. Angelita Brooklyn Corinn Jess Brooklyn MD, MD 08/07/2023 11:32:27 AM This report has been signed  electronically. Number of Addenda: 0 Note Initiated On: 08/07/2023 10:31 AM Scope Withdrawal Time: 0 hours 14 minutes 18 seconds  Total Procedure Duration: 0 hours 19 minutes 52 seconds  Estimated Blood Loss:  Estimated blood loss: none.      [facility]

## 2023-08-07 NOTE — Anesthesia Preprocedure Evaluation (Signed)
 Anesthesia Evaluation  Patient identified by MRN, date of birth, ID band Patient awake    Reviewed: Allergy & Precautions, NPO status , Patient's Chart, lab work & pertinent test results  History of Anesthesia Complications Negative for: history of anesthetic complications  Airway Mallampati: III  TM Distance: >3 FB Neck ROM: full    Dental  (+) Chipped   Pulmonary shortness of breath and with exertion, asthma , sleep apnea    Pulmonary exam normal        Cardiovascular Exercise Tolerance: Good hypertension, (-) angina + CAD  Normal cardiovascular exam     Neuro/Psych  Headaches PSYCHIATRIC DISORDERS         GI/Hepatic Neg liver ROS,GERD  Controlled,,  Endo/Other  diabetes, Type 2Hypothyroidism  Class 3 obesity  Renal/GU negative Renal ROS  negative genitourinary   Musculoskeletal   Abdominal   Peds  Hematology negative hematology ROS (+)   Anesthesia Other Findings Past Medical History: No date: Anginal pain (HCC)     Comment:  none in approx 2 yrs No date: Anxiety No date: Asthma No date: Chronic lower back pain 09/20/2016: Common migraine with intractable migraine No date: Depression     Comment:  suicidal ideation No date: Diabetes mellitus without complication (HCC) No date: GERD (gastroesophageal reflux disease) No date: Headache     Comment:  migraines -3x/wk No date: Hyperlipidemia No date: Hypertension No date: Hypothyroidism No date: Motion sickness     Comment:  cars No date: Multilevel degenerative disc disease No date: Shortness of breath dyspnea No date: Sleep apnea     Comment:  CPAP No date: Vertigo No date: Wears dentures     Comment:  partial upper  Past Surgical History: No date: ABDOMINAL HYSTERECTOMY 1995: bladder tach No date: CESAREAN SECTION 05/29/2015: COLONOSCOPY WITH PROPOFOL ; N/A     Comment:  Procedure: COLONOSCOPY WITH PROPOFOL ;  Surgeon: Rogelia Copping,  MD;  Location: [facility];  Service:               Endoscopy;  Laterality: N/A;  Latex allergy sleep apnea               - no CPAP machine (yet) 12/18/2021: COLONOSCOPY WITH PROPOFOL ; N/A     Comment:  Procedure: COLONOSCOPY WITH PROPOFOL ;  Surgeon: Unk Corinn Skiff, MD;  Location: [facility] END[facility];  Service:               Gastroenterology;  Laterality: N/A; 04/23/2017: ESOPHAGOGASTRODUODENOSCOPY; N/A     Comment:  Procedure: ESOPHAGOGASTRODUODENOSCOPY (EGD);  Surgeon:               Unk Corinn Skiff, MD;  Location: [facility];               Service: Gastroenterology;  Laterality: N/A;  Latex               allergy sleep apnea 05/29/2015: POLYPECTOMY     Comment:  Procedure: POLYPECTOMY;  Surgeon: Rogelia Copping, MD;                Location: [facility];  Service: Endoscopy;;  BMI    Body Mass Index: 42.32 kg/m      Reproductive/Obstetrics negative OB ROS  Anesthesia Physical Anesthesia Plan  ASA: 3  Anesthesia Plan: General   Post-op Pain Management:    Induction: Intravenous  PONV Risk Score and Plan: Propofol  infusion and TIVA  Airway Management Planned: Natural Airway and Nasal Cannula  Additional Equipment:   Intra-op Plan:   Post-operative Plan:   Informed Consent: I have reviewed the patients History and Physical, chart, labs and discussed the procedure including the risks, benefits and alternatives for the proposed anesthesia with the patient or authorized representative who has indicated his/her understanding and acceptance.     Dental Advisory Given  Plan Discussed with: Anesthesiologist, CRNA and Surgeon  Anesthesia Plan Comments: (Patient consented for risks of anesthesia including but not limited to:  - adverse reactions to medications - risk of airway placement if required - damage to eyes, teeth, lips or other oral mucosa - nerve damage due to positioning   - sore throat or hoarseness - Damage to heart, brain, nerves, lungs, other parts of body or loss of life  Patient voiced understanding and assent.)       Anesthesia Quick Evaluation

## 2023-08-07 NOTE — Progress Notes (Unsigned)
 Medical Clearance has been received from Dr Carlynn Purl. The patient is cleared at Medium risk for surgery.

## 2023-08-08 ENCOUNTER — Other Ambulatory Visit: Payer: Self-pay

## 2023-08-08 ENCOUNTER — Encounter: Payer: Self-pay | Admitting: Gastroenterology

## 2023-08-08 ENCOUNTER — Encounter
Admission: RE | Admit: 2023-08-08 | Discharge: 2023-08-08 | Disposition: A | Discharge status: Home / Self Care | Payer: Commercial Managed Care - HMO | Source: Ambulatory Visit | Attending: Surgery | Admitting: Surgery

## 2023-08-08 VITALS — Ht 61.0 in | Wt 224.0 lb

## 2023-08-08 DIAGNOSIS — E1169 Type 2 diabetes mellitus with other specified complication: Secondary | ICD-10-CM

## 2023-08-08 DIAGNOSIS — I10 Essential (primary) hypertension: Secondary | ICD-10-CM

## 2023-08-08 DIAGNOSIS — Z01810 Encounter for preprocedural cardiovascular examination: Secondary | ICD-10-CM

## 2023-08-08 DIAGNOSIS — R9431 Abnormal electrocardiogram [ECG] [EKG]: Secondary | ICD-10-CM | POA: Insufficient documentation

## 2023-08-08 DIAGNOSIS — Z01812 Encounter for preprocedural laboratory examination: Secondary | ICD-10-CM

## 2023-08-08 DIAGNOSIS — E119 Type 2 diabetes mellitus without complications: Secondary | ICD-10-CM

## 2023-08-08 DIAGNOSIS — Z79899 Other long term (current) drug therapy: Secondary | ICD-10-CM

## 2023-08-08 DIAGNOSIS — E876 Hypokalemia: Secondary | ICD-10-CM

## 2023-08-08 DIAGNOSIS — E785 Hyperlipidemia, unspecified: Secondary | ICD-10-CM | POA: Diagnosis not present

## 2023-08-08 DIAGNOSIS — Z6841 Body Mass Index (BMI) 40.0 and over, adult: Secondary | ICD-10-CM | POA: Diagnosis not present

## 2023-08-08 HISTORY — DX: Iron deficiency anemia, unspecified: D50.9

## 2023-08-08 HISTORY — DX: Nontoxic multinodular goiter: E04.2

## 2023-08-08 HISTORY — DX: Benign neoplasm of sigmoid colon: D12.5

## 2023-08-08 HISTORY — DX: Other constipation: K59.09

## 2023-08-08 HISTORY — DX: Morbid (severe) obesity due to excess calories: E66.01

## 2023-08-08 HISTORY — DX: Type 2 diabetes mellitus with diabetic polyneuropathy: E11.42

## 2023-08-08 HISTORY — DX: Vitamin D deficiency, unspecified: E55.9

## 2023-08-08 HISTORY — DX: Body Mass Index (BMI) 40.0 and over, adult: Z684

## 2023-08-08 LAB — SURGICAL PATHOLOGY

## 2023-08-08 LAB — CBC
HCT: 34.7 % — ABNORMAL LOW (ref 36.0–46.0)
Hemoglobin: 11.7 g/dL — ABNORMAL LOW (ref 12.0–15.0)
MCH: 28.4 pg (ref 26.0–34.0)
MCHC: 33.7 g/dL (ref 30.0–36.0)
MCV: 84.2 fL (ref 80.0–100.0)
Platelets: 308 10*3/uL (ref 150–400)
RBC: 4.12 MIL/uL (ref 3.87–5.11)
RDW: 14.7 % (ref 11.5–15.5)
WBC: 6.8 10*3/uL (ref 4.0–10.5)
nRBC: 0 % (ref 0.0–0.2)

## 2023-08-08 LAB — BASIC METABOLIC PANEL
Anion gap: 9 (ref 5–15)
BUN: 12 mg/dL (ref 8–23)
CO2: 25 mmol/L (ref 22–32)
Calcium: 8.8 mg/dL — ABNORMAL LOW (ref 8.9–10.3)
Chloride: 107 mmol/L (ref 98–111)
Creatinine, Ser: 0.62 mg/dL (ref 0.44–1.00)
GFR, Estimated: >60 mL/min (ref >60)
Glucose, Bld: 105 mg/dL — ABNORMAL HIGH (ref 70–99)
Potassium: 3.1 mmol/L — ABNORMAL LOW (ref 3.5–5.1)
Sodium: 141 mmol/L (ref 135–145)

## 2023-08-08 MED ORDER — POTASSIUM CHLORIDE CRYS ER 20 MEQ PO TBCR: 20.0000 meq | EXTENDED_RELEASE_TABLET | Freq: Every day | ORAL | 0 refills | Status: DC

## 2023-08-08 NOTE — Patient Instructions (Addendum)
 Your procedure is scheduled on: Tuesday, January 14 Report to the Registration Desk on the 1st floor of the [facility] To find out your arrival time, please call [phone] between 1PM - 3PM on: Monday, January 13 If your arrival time is 6:00 am, do not arrive before that time as the [facility] entrance doors do not open until 6:00 am.  REMEMBER: Instructions that are not followed completely may result in serious medical risk, up to and including death; or upon the discretion of your surgeon and anesthesiologist your surgery may need to be rescheduled.  Do not eat food after midnight the night before surgery.  No gum chewing or hard candies.  You may however, drink water  up to 2 hours before you are scheduled to arrive for your surgery. Do not drink anything within 2 hours of your scheduled arrival time.   One week prior to surgery:  Stop Anti-inflammatories (NSAIDS) such as Advil , Aleve, Ibuprofen , Motrin , Naproxen, Naprosyn and Aspirin  based products such as Excedrin, Goody's Powder, BC Powder. Stop ANY OVER THE COUNTER supplements until after surgery.  You may however, continue to take Tylenol  if needed for pain up until the day of surgery.  Aspirin  - per Dr. Desiderio, last day to take Aspirin  is Wednesday, January 8. Resume AFTER surgery per surgeon instruction.  Metformin  - hold for 2 days before surgery. Last day to take Metformin  is Saturday, January 11. Resume AFTER surgery.  Continue taking all of your other prescription medications up until the day of surgery.  ON THE DAY OF SURGERY ONLY TAKE THESE MEDICATIONS WITH SIPS OF WATER :  Amlodipine  Gabapentin  Levothyroxine  Metoprolol  Omeprazole  (Prilosec) Fluticasone  Furoate (ARNUITY ELLIPTA ) inhaler  Use inhalers on the day of surgery and bring your ventolin  inhaler to the hospital.  No Alcohol for 24 hours before or after surgery.  No Smoking including e-cigarettes for 24 hours before surgery.  No chewable tobacco  products for at least 6 hours before surgery.  No nicotine patches on the day of surgery.  Do not use any recreational drugs for at least a week (preferably 2 weeks) before your surgery.  Please be advised that the combination of cocaine and anesthesia may have negative outcomes, up to and including death. If you test positive for cocaine, your surgery will be cancelled.  On the morning of surgery brush your teeth with toothpaste and water , you may rinse your mouth with mouthwash if you wish. Do not swallow any toothpaste or mouthwash.  Use CHG Soap as directed on instruction sheet.  Do not wear jewelry, make-up, hairpins, clips or nail polish.  For welded (permanent) jewelry: bracelets, anklets, waist bands, etc.  Please have this removed prior to surgery.  If it is not removed, there is a chance that hospital personnel will need to cut it off on the day of surgery.  Do not wear lotions, powders, or perfumes.   Do not shave body hair from the neck down 48 hours before surgery.  Contact lenses, hearing aids and dentures may not be worn into surgery.  Do not bring valuables to the hospital. [facility] is not responsible for any missing/lost belongings or valuables.   Bring your C-PAP to the hospital in case you may have to spend the night.   Notify your doctor if there is any change in your medical condition (cold, fever, infection).  Wear comfortable clothing (specific to your surgery type) to the hospital.  After surgery, you can help prevent lung complications by doing breathing  exercises.  Take deep breaths and cough every 1-2 hours. Your doctor may order a device called an Incentive Spirometer to help you take deep breaths. When coughing or sneezing, hold a pillow firmly against your incision with both hands. This is called "splinting." Doing this helps protect your incision. It also decreases belly discomfort.  If you are being discharged the day of surgery, you will not be  allowed to drive home. You will need a responsible individual to drive you home and stay with you for 24 hours after surgery.   If you are taking public transportation, you will need to have a responsible individual with you.  Please call the Pre-admissions Testing Dept. at [phone] if you have any questions about these instructions.  Surgery Visitation Policy:  Patients having surgery or a procedure may have two visitors.  Children under the age of 62 must have an adult with them who is not the patient.      Preparing for Surgery with CHLORHEXIDINE  GLUCONATE (CHG) Soap  Chlorhexidine  Gluconate (CHG) Soap  o An antiseptic cleaner that kills germs and bonds with the skin to continue killing germs even after washing  o Used for showering the night before surgery and morning of surgery  Before surgery, you can play an important role by reducing the number of germs on your skin.  CHG (Chlorhexidine  gluconate) soap is an antiseptic cleanser which kills germs and bonds with the skin to continue killing germs even after washing.  Please do not use if you have an allergy to CHG or antibacterial soaps. If your skin becomes reddened/irritated stop using the CHG.  1. Shower the NIGHT BEFORE SURGERY and the MORNING OF SURGERY with CHG soap.  2. If you choose to wash your hair, wash your hair first as usual with your normal shampoo.  3. After shampooing, rinse your hair and body thoroughly to remove the shampoo.  4. Use CHG as you would any other liquid soap. You can apply CHG directly to the skin and wash gently with a scrungie or a clean washcloth.  5. Apply the CHG soap to your body only from the neck down. Do not use on open wounds or open sores. Avoid contact with your eyes, ears, mouth, and genitals (private parts). Wash face and genitals (private parts) with your normal soap.  6. Wash thoroughly, paying special attention to the area where your surgery will be performed.  7.  Thoroughly rinse your body with warm water .  8. Do not shower/wash with your normal soap after using and rinsing off the CHG soap.  9. Pat yourself dry with a clean towel.  10. Wear clean pajamas to bed the night before surgery.  12. Place clean sheets on your bed the night of your first shower and do not sleep with pets.  13. Shower again with the CHG soap on the day of surgery prior to arriving at the hospital.  14. Do not apply any deodorants/lotions/powders.  15. Please wear clean clothes to the hospital.

## 2023-08-08 NOTE — Progress Notes (Signed)
 [facility] Perioperative Services: Pre-Admission/Anesthesia Testing  Abnormal Lab Notification and Treatment Plan of Care   Date: 08/08/23  Name: Beverly Barrett MRN:   [id number]  Re: Abnormal labs noted during PAT appointment   Notified:  Provider Name Provider Role Notification Mode  Desiderio Schanz, MD General Surgery (Surgeon) Routed and/or faxed via [facility]  Sowles, Krichna, MD Primary Care Provider Routed and/or faxed via [facility]   Clinical Information and Notes:  ABNORMAL LAB VALUE(S): Lab Results  Component Value Date   K 3.1 (L) 08/08/2023   Beverly Barrett is scheduled for an elective XI ROBOTIC ASSISTED LAPAROSCOPIC CHOLECYSTECTOMY on 08/12/2023. In review of her medication reconciliation, it is noted that the patient is taking prescribed diuretic medications (HCTZ 25 mg) daily.   Please note, in efforts to promote a safe and effective anesthetic course, per current guidelines/standards set by the [facility] anesthesia team, the minimal acceptable K+ level for the patient to proceed with general anesthesia is 3.0 mmol/L. With that being said, if the patient drops any lower, her elective procedure will need to be postponed until K+ is better optimized. In efforts to prevent case cancellation, and ultimately to promote the safety of this patient undergoing sedation/anesthesia, will make efforts to optimize pre-surgical K+ level allowing the surgical intervention to proceed as planned.    Impression and Plan:  Beverly Barrett found to be HYPOkalemic at 3.1 mmol/L on preoperative labs.   Contacted patient to discuss results and plans for correction of noted electrolyte derangement. She is on daily thiazide diuretic therapy. Patient does not take any type of K+ supplementation. Discussed diuretic therapy as likely etiology in the absence of GI related symptoms (no diarrhea). Patient denies regular use of laxative medications. Reviewed other potential causes,  including decreased intake of dietary K+ rich foods. Reviewed plans for preoperative optimization as follows:   Meds ordered this encounter  Medications   potassium chloride  SA (KLOR-CON  M) 20 MEQ tablet    Sig: Take 1 tablet (20 mEq total) by mouth daily. Be sure to take dose on day of surgery. Follow up with PCP for repeat labs.    Dispense:  5 tablet    Refill:  0    Please contact patient when Rx is available for pick up. This Rx is for preoperative K+ optimization and needs to be started ASAP   Encouraged patient to follow up with PCP about 2-3 weeks postoperatively to have labs rechecked to ensure that levels are remaining within normal range. Discussed nutritional intake of K+ rich foods as an adjunctive way to keep her K+ levels normal; list of K+ rich foods provided. Also mentioned ORS, however advised her not to rely solely on these drinks, as they are high in Na+, and she has a HTN diagnosis.   Will send copy of this note to surgeon and PCP to make them aware of K+ level and plans for correction. Discussed that PCP may elect to pursue a change in diuretic therapy to a K+ sparing type medication, or alternatively, they may consider adding a daily K+ supplement if levels remain low on recheck. Order entered to recheck K+ on the day of her surgery to ensure optimization. Wished patient the best of luck with her upcoming surgery and subsequent recovery. She was encouraged to return call to the PAT clinic, or to her surgeon's office, should any questions or concerns arise between now and the time of her surgery. Patient was appreciative of the care/concern expressed by PAT staff.  Encounter Diagnoses  Name Primary?   Pre-operative laboratory examination Yes   Diuretic-induced hypokalemia    Long term current use of diuretic    Kennis Buell, MSN, APRN, FNP-C, CEN [facility]  Perioperative Services Nurse Practitioner Phone: [phone] 08/08/23 1:33 PM  NOTE: This  note has been prepared using Dragon dictation software. Despite my best ability to proofread, there is always the potential that unintentional transcriptional errors may still occur from this process.

## 2023-08-11 MED ORDER — CHLORHEXIDINE GLUCONATE 0.12 % MT SOLN
15.0000 mL | Freq: Once | OROMUCOSAL | Status: AC
  Administered 2023-08-12: 15 mL via OROMUCOSAL

## 2023-08-11 MED ORDER — ORAL CARE MOUTH RINSE: 15.0000 mL | Freq: Once | OROMUCOSAL | Status: AC

## 2023-08-11 MED ORDER — SODIUM CHLORIDE 0.9 % IV SOLN: INTRAVENOUS | Status: DC

## 2023-08-12 ENCOUNTER — Encounter
Admission: RE | Disposition: A | Discharge status: Home / Self Care | Payer: Self-pay | Source: Home / Self Care | Attending: Surgery

## 2023-08-12 ENCOUNTER — Encounter: Payer: Self-pay | Admitting: Gastroenterology

## 2023-08-12 ENCOUNTER — Encounter: Payer: Self-pay | Admitting: Surgery

## 2023-08-12 ENCOUNTER — Ambulatory Visit: Payer: Self-pay | Admitting: Urgent Care

## 2023-08-12 ENCOUNTER — Other Ambulatory Visit: Payer: Self-pay

## 2023-08-12 ENCOUNTER — Ambulatory Visit
Admission: RE | Admit: 2023-08-12 | Discharge: 2023-08-12 | Disposition: A | Discharge status: Home / Self Care | Payer: Commercial Managed Care - HMO | Attending: Surgery | Admitting: Surgery

## 2023-08-12 DIAGNOSIS — E039 Hypothyroidism, unspecified: Secondary | ICD-10-CM | POA: Insufficient documentation

## 2023-08-12 DIAGNOSIS — K219 Gastro-esophageal reflux disease without esophagitis: Secondary | ICD-10-CM | POA: Diagnosis not present

## 2023-08-12 DIAGNOSIS — Z7982 Long term (current) use of aspirin: Secondary | ICD-10-CM | POA: Diagnosis not present

## 2023-08-12 DIAGNOSIS — G4730 Sleep apnea, unspecified: Secondary | ICD-10-CM | POA: Insufficient documentation

## 2023-08-12 DIAGNOSIS — K828 Other specified diseases of gallbladder: Secondary | ICD-10-CM | POA: Diagnosis not present

## 2023-08-12 DIAGNOSIS — K811 Chronic cholecystitis: Secondary | ICD-10-CM | POA: Insufficient documentation

## 2023-08-12 DIAGNOSIS — I10 Essential (primary) hypertension: Secondary | ICD-10-CM | POA: Diagnosis not present

## 2023-08-12 DIAGNOSIS — E876 Hypokalemia: Secondary | ICD-10-CM

## 2023-08-12 DIAGNOSIS — E119 Type 2 diabetes mellitus without complications: Secondary | ICD-10-CM | POA: Insufficient documentation

## 2023-08-12 DIAGNOSIS — Z7984 Long term (current) use of oral hypoglycemic drugs: Secondary | ICD-10-CM | POA: Diagnosis not present

## 2023-08-12 DIAGNOSIS — Z01812 Encounter for preprocedural laboratory examination: Secondary | ICD-10-CM

## 2023-08-12 DIAGNOSIS — Z79899 Other long term (current) drug therapy: Secondary | ICD-10-CM

## 2023-08-12 LAB — POCT I-STAT, CHEM 8
BUN: 8 mg/dL (ref 8–23)
Calcium, Ion: 1.16 mmol/L (ref 1.15–1.40)
Chloride: 105 mmol/L (ref 98–111)
Creatinine, Ser: 0.7 mg/dL (ref 0.44–1.00)
Glucose, Bld: 114 mg/dL — ABNORMAL HIGH (ref 70–99)
HCT: 35 % — ABNORMAL LOW (ref 36.0–46.0)
Hemoglobin: 11.9 g/dL — ABNORMAL LOW (ref 12.0–15.0)
Potassium: 3.6 mmol/L (ref 3.5–5.1)
Sodium: 142 mmol/L (ref 135–145)
TCO2: 25 mmol/L (ref 22–32)

## 2023-08-12 LAB — GLUCOSE, CAPILLARY: Glucose-Capillary: 124 mg/dL — ABNORMAL HIGH (ref 70–99)

## 2023-08-12 SURGERY — CHOLECYSTECTOMY, ROBOT-ASSISTED, LAPAROSCOPIC
Anesthesia: General

## 2023-08-12 MED ORDER — INDOCYANINE GREEN 25 MG IV SOLR
1.2500 mg | Freq: Once | INTRAVENOUS | Status: AC
  Administered 2023-08-12: 1.25 mg via TOPICAL

## 2023-08-12 MED ORDER — FENTANYL CITRATE (PF) 100 MCG/2ML IJ SOLN
INTRAMUSCULAR | Status: DC | PRN
  Administered 2023-08-12: 100 ug via INTRAVENOUS

## 2023-08-12 MED ORDER — PHENYLEPHRINE 80 MCG/ML (10ML) SYRINGE FOR IV PUSH (FOR BLOOD PRESSURE SUPPORT)
PREFILLED_SYRINGE | INTRAVENOUS | Status: DC | PRN
Start: 2023-08-12 — End: 2023-08-12
  Administered 2023-08-12: 160 ug via INTRAVENOUS
  Administered 2023-08-12: 80 ug via INTRAVENOUS
  Administered 2023-08-12 (×2): 160 ug via INTRAVENOUS

## 2023-08-12 MED ORDER — LIDOCAINE HCL (CARDIAC) PF 100 MG/5ML IV SOSY
PREFILLED_SYRINGE | INTRAVENOUS | Status: DC | PRN
  Administered 2023-08-12: 100 mg via INTRAVENOUS

## 2023-08-12 MED ORDER — BUPIVACAINE-EPINEPHRINE (PF) 0.5% -1:200000 IJ SOLN
INTRAMUSCULAR | Status: DC | PRN
  Administered 2023-08-12: 50 mL via SURGICAL_CAVITY

## 2023-08-12 MED ORDER — FENTANYL CITRATE (PF) 100 MCG/2ML IJ SOLN
INTRAMUSCULAR | Status: AC
  Filled 2023-08-12: qty 2

## 2023-08-12 MED ORDER — FENTANYL CITRATE (PF) 100 MCG/2ML IJ SOLN
25.0000 ug | INTRAMUSCULAR | Status: DC | PRN
Start: 2023-08-12 — End: 2023-08-12
  Administered 2023-08-12: 25 ug via INTRAVENOUS
  Administered 2023-08-12: 50 ug via INTRAVENOUS

## 2023-08-12 MED ORDER — GABAPENTIN 300 MG PO CAPS
ORAL_CAPSULE | ORAL | Status: AC
  Filled 2023-08-12: qty 1

## 2023-08-12 MED ORDER — GLYCOPYRROLATE 0.2 MG/ML IJ SOLN
INTRAMUSCULAR | Status: DC | PRN
  Administered 2023-08-12: .2 mg via INTRAVENOUS

## 2023-08-12 MED ORDER — GABAPENTIN 300 MG PO CAPS
300.0000 mg | ORAL_CAPSULE | ORAL | Status: AC
Start: 2023-08-12 — End: 2023-08-12
  Administered 2023-08-12: 300 mg via ORAL

## 2023-08-12 MED ORDER — DEXAMETHASONE SODIUM PHOSPHATE 10 MG/ML IJ SOLN
INTRAMUSCULAR | Status: DC | PRN
  Administered 2023-08-12: 8 mg via INTRAVENOUS

## 2023-08-12 MED ORDER — ACETAMINOPHEN 500 MG PO TABS
1000.0000 mg | ORAL_TABLET | ORAL | Status: AC
  Administered 2023-08-12: 1000 mg via ORAL

## 2023-08-12 MED ORDER — CHLORHEXIDINE GLUCONATE 0.12 % MT SOLN
OROMUCOSAL | Status: AC
Start: 2023-08-12 — End: ?
  Filled 2023-08-12: qty 15

## 2023-08-12 MED ORDER — IBUPROFEN 600 MG PO TABS: 600.0000 mg | ORAL_TABLET | Freq: Three times a day (TID) | ORAL | 1 refills | Status: DC | PRN

## 2023-08-12 MED ORDER — ROCURONIUM BROMIDE 100 MG/10ML IV SOLN
INTRAVENOUS | Status: DC | PRN
  Administered 2023-08-12: 20 mg via INTRAVENOUS
  Administered 2023-08-12: 60 mg via INTRAVENOUS

## 2023-08-12 MED ORDER — MIDAZOLAM HCL 2 MG/2ML IJ SOLN
INTRAMUSCULAR | Status: AC
  Filled 2023-08-12: qty 2

## 2023-08-12 MED ORDER — BUPIVACAINE-EPINEPHRINE (PF) 0.25% -1:200000 IJ SOLN
INTRAMUSCULAR | Status: AC
  Filled 2023-08-12: qty 30

## 2023-08-12 MED ORDER — ACETAMINOPHEN 500 MG PO TABS
ORAL_TABLET | ORAL | Status: AC
  Filled 2023-08-12: qty 2

## 2023-08-12 MED ORDER — PROPOFOL 10 MG/ML IV BOLUS
INTRAVENOUS | Status: AC
  Filled 2023-08-12: qty 20

## 2023-08-12 MED ORDER — CHLORHEXIDINE GLUCONATE CLOTH 2 % EX PADS: 6.0000 | MEDICATED_PAD | Freq: Once | CUTANEOUS | Status: DC

## 2023-08-12 MED ORDER — PROPOFOL 10 MG/ML IV BOLUS
INTRAVENOUS | Status: DC | PRN
  Administered 2023-08-12: 200 mg via INTRAVENOUS

## 2023-08-12 MED ORDER — BUPIVACAINE LIPOSOME 1.3 % IJ SUSP: 10.0000 mL | Freq: Once | INTRAMUSCULAR | Status: DC

## 2023-08-12 MED ORDER — SUGAMMADEX SODIUM 200 MG/2ML IV SOLN
INTRAVENOUS | Status: DC | PRN
  Administered 2023-08-12: 200 mg via INTRAVENOUS

## 2023-08-12 MED ORDER — LIDOCAINE HCL (PF) 2 % IJ SOLN
INTRAMUSCULAR | Status: AC
  Filled 2023-08-12: qty 5

## 2023-08-12 MED ORDER — ACETAMINOPHEN 500 MG PO TABS: 1000.0000 mg | ORAL_TABLET | Freq: Four times a day (QID) | ORAL | Status: AC | PRN

## 2023-08-12 MED ORDER — CEFAZOLIN SODIUM-DEXTROSE 2-4 GM/100ML-% IV SOLN
INTRAVENOUS | Status: AC
  Filled 2023-08-12: qty 100

## 2023-08-12 MED ORDER — EPHEDRINE SULFATE-NACL 50-0.9 MG/10ML-% IV SOSY
PREFILLED_SYRINGE | INTRAVENOUS | Status: DC | PRN
  Administered 2023-08-12 (×2): 10 mg via INTRAVENOUS

## 2023-08-12 MED ORDER — BUPIVACAINE LIPOSOME 1.3 % IJ SUSP
INTRAMUSCULAR | Status: AC
  Filled 2023-08-12: qty 20

## 2023-08-12 MED ORDER — OXYCODONE HCL 5 MG/5ML PO SOLN
5.0000 mg | Freq: Once | ORAL | Status: AC | PRN
Start: 2023-08-12 — End: 2023-08-12

## 2023-08-12 MED ORDER — ONDANSETRON HCL 4 MG/2ML IJ SOLN
INTRAMUSCULAR | Status: DC | PRN
  Administered 2023-08-12: 4 mg via INTRAVENOUS

## 2023-08-12 MED ORDER — CHLORHEXIDINE GLUCONATE CLOTH 2 % EX PADS
6.0000 | MEDICATED_PAD | Freq: Once | CUTANEOUS | Status: DC
Start: 2023-08-12 — End: 2023-08-12

## 2023-08-12 MED ORDER — ROCURONIUM BROMIDE 10 MG/ML (PF) SYRINGE
PREFILLED_SYRINGE | INTRAVENOUS | Status: AC
  Filled 2023-08-12: qty 10

## 2023-08-12 MED ORDER — OXYCODONE HCL 5 MG PO TABS
5.0000 mg | ORAL_TABLET | Freq: Once | ORAL | Status: AC | PRN
  Administered 2023-08-12: 5 mg via ORAL

## 2023-08-12 MED ORDER — INDOCYANINE GREEN 25 MG IV SOLR
INTRAVENOUS | Status: AC
  Filled 2023-08-12: qty 10

## 2023-08-12 MED ORDER — OXYCODONE HCL 5 MG PO TABS: 5.0000 mg | ORAL_TABLET | ORAL | 0 refills | Status: DC | PRN

## 2023-08-12 MED ORDER — KETOROLAC TROMETHAMINE 30 MG/ML IJ SOLN
INTRAMUSCULAR | Status: DC | PRN
  Administered 2023-08-12: 15 mg via INTRAVENOUS

## 2023-08-12 MED ORDER — ONDANSETRON HCL 4 MG/2ML IJ SOLN
INTRAMUSCULAR | Status: AC
  Filled 2023-08-12: qty 2

## 2023-08-12 MED ORDER — CEFAZOLIN SODIUM-DEXTROSE 2-4 GM/100ML-% IV SOLN
2.0000 g | INTRAVENOUS | Status: AC
  Administered 2023-08-12: 2 g via INTRAVENOUS

## 2023-08-12 MED ORDER — DEXAMETHASONE SODIUM PHOSPHATE 10 MG/ML IJ SOLN
INTRAMUSCULAR | Status: AC
  Filled 2023-08-12: qty 1

## 2023-08-12 MED ORDER — OXYCODONE HCL 5 MG PO TABS
ORAL_TABLET | ORAL | Status: AC
  Filled 2023-08-12: qty 1

## 2023-08-12 SURGICAL SUPPLY — 44 items
BAG PRESSURE INF REUSE 1000 (BAG) IMPLANT
CANNULA CAP OBTURATR AIRSEAL 8 (CAP) IMPLANT
CAUTERY HOOK MNPLR 1.6 DVNC XI (INSTRUMENTS) ×1 IMPLANT
CLIP LIGATING HEMO O LOK GREEN (MISCELLANEOUS) ×1 IMPLANT
DERMABOND ADVANCED .7 DNX12 (GAUZE/BANDAGES/DRESSINGS) ×1 IMPLANT
DRAPE ARM DVNC X/XI (DISPOSABLE) ×4 IMPLANT
DRAPE COLUMN DVNC XI (DISPOSABLE) ×1 IMPLANT
ELECT CAUTERY BLADE TIP 2.5 (TIP) ×1
ELECT REM PT RETURN 9FT ADLT (ELECTROSURGICAL) ×1
ELECTRODE CAUTERY BLDE TIP 2.5 (TIP) ×1 IMPLANT
ELECTRODE REM PT RTRN 9FT ADLT (ELECTROSURGICAL) ×1 IMPLANT
FORCEPS BPLR R/ABLATION 8 DVNC (INSTRUMENTS) ×1 IMPLANT
FORCEPS PROGRASP DVNC XI (FORCEP) ×1 IMPLANT
GLOVE SURG SYN 7.0 (GLOVE) ×2
GLOVE SURG SYN 7.0 PF PI (GLOVE) ×2 IMPLANT
GLOVE SURG SYN 7.5 E (GLOVE) ×2
GLOVE SURG SYN 7.5 PF PI (GLOVE) ×2 IMPLANT
GOWN STRL REUS W/ TWL LRG LVL3 (GOWN DISPOSABLE) ×4 IMPLANT
IRRIGATOR SUCT 8 DISP DVNC XI (IRRIGATION / IRRIGATOR) IMPLANT
IV NS 1000ML BAXH (IV SOLUTION) IMPLANT
KIT PINK PAD W/HEAD ARE REST (MISCELLANEOUS) ×1
KIT PINK PAD W/HEAD ARM REST (MISCELLANEOUS) ×1 IMPLANT
L-HOOK LAP DISP 36CM (ELECTROSURGICAL) ×1
LABEL OR SOLS (LABEL) ×1 IMPLANT
LHOOK LAP DISP 36CM (ELECTROSURGICAL) IMPLANT
MANIFOLD NEPTUNE II (INSTRUMENTS) ×1 IMPLANT
NDL HYPO 22X1.5 SAFETY MO (MISCELLANEOUS) ×1 IMPLANT
NEEDLE HYPO 22X1.5 SAFETY MO (MISCELLANEOUS) ×1
NS IRRIG 500ML POUR BTL (IV SOLUTION) ×1 IMPLANT
OBTURATOR OPTICAL STND 8 DVNC (TROCAR) ×1
OBTURATOR OPTICALSTD 8 DVNC (TROCAR) ×1 IMPLANT
PACK LAP CHOLECYSTECTOMY (MISCELLANEOUS) ×1 IMPLANT
SEAL UNIV 5-12 XI (MISCELLANEOUS) ×4 IMPLANT
SET TUBE FILTERED XL AIRSEAL (SET/KITS/TRAYS/PACK) IMPLANT
SET TUBE SMOKE EVAC HIGH FLOW (TUBING) ×1 IMPLANT
SOL ELECTROSURG ANTI STICK (MISCELLANEOUS) ×1
SOLUTION ELECTROSURG ANTI STCK (MISCELLANEOUS) ×1 IMPLANT
SPIKE FLUID TRANSFER (MISCELLANEOUS) ×1 IMPLANT
SUT MNCRL AB 4-0 PS2 18 (SUTURE) ×1 IMPLANT
SUT VIC AB 3-0 SH 27X BRD (SUTURE) IMPLANT
SUT VICRYL 0 UR6 27IN ABS (SUTURE) ×2 IMPLANT
SYS BAG RETRIEVAL 10MM (BASKET) ×1
SYSTEM BAG RETRIEVAL 10MM (BASKET) ×1 IMPLANT
WATER STERILE IRR 500ML POUR (IV SOLUTION) ×1 IMPLANT

## 2023-08-12 NOTE — Discharge Instructions (Addendum)
 Discharge Instructions: 1.  Patient may shower, but do not scrub wounds heavily and dab dry only. 2.  Do not submerge wounds in pool/tub until fully healed. 3.  Do not apply ointments or hydrogen peroxide to the wounds. 4.  May apply ice packs to the wounds for comfort. 5.  May resume your Aspirin  on 08/14/23. 6.  No heavy lifting or pushing of more than 10-15 lbs for 4 weeks. 7.  Do not drive while taking narcotics for pain control.  Prior to driving, make sure you are able to rotate right and left to look at blindspots without significant pain or discomfort. Information for Discharge Teaching: EXPAREL  (bupivacaine  liposome injectable suspension)   Pain relief is important to your recovery. The goal is to control your pain so you can move easier and return to your normal activities as soon as possible after your procedure. Your physician may use several types of medicines to manage pain, swelling, and more.  Your surgeon or anesthesiologist gave you EXPAREL (bupivacaine ) to help control your pain after surgery.  EXPAREL  is a local anesthetic designed to release slowly over an extended period of time to provide pain relief by numbing the tissue around the surgical site. EXPAREL  is designed to release pain medication over time and can control pain for up to 72 hours. Depending on how you respond to EXPAREL , you may require less pain medication during your recovery. EXPAREL  can help reduce or eliminate the need for opioids during the first few days after surgery when pain relief is needed the most. EXPAREL  is not an opioid and is not addictive. It does not cause sleepiness or sedation.   Important! A teal colored band has been placed on your arm with the date, time and amount of EXPAREL  you have received. Please leave this armband in place for the full 96 hours following administration, and then you may remove the band. If you return to the hospital for any reason within 96 hours following the  administration of EXPAREL , the armband provides important information that your health care providers to know, and alerts them that you have received this anesthetic.    Possible side effects of EXPAREL : Temporary loss of sensation or ability to move in the area where medication was injected. Nausea, vomiting, constipation Rarely, numbness and tingling in your mouth or lips, lightheadedness, or anxiety may occur. Call your doctor right away if you think you may be experiencing any of these sensations, or if you have other questions regarding possible side effects.  Follow all other discharge instructions given to you by your surgeon or nurse. Eat a healthy diet and drink plenty of water  or other fluids.

## 2023-08-12 NOTE — Anesthesia Postprocedure Evaluation (Signed)
 Anesthesia Post Note  Patient: Ambreen Tufte  Procedure(s) Performed: XI ROBOTIC ASSISTED LAPAROSCOPIC CHOLECYSTECTOMY INDOCYANINE GREEN  FLUORESCENCE IMAGING (ICG)  Patient location during evaluation: PACU Anesthesia Type: General Level of consciousness: awake and alert Pain management: pain level controlled Vital Signs Assessment: post-procedure vital signs reviewed and stable Respiratory status: spontaneous breathing, nonlabored ventilation, respiratory function stable and patient connected to nasal cannula oxygen Cardiovascular status: blood pressure returned to baseline and stable Postop Assessment: no apparent nausea or vomiting Anesthetic complications: no   No notable events documented.   Last Vitals:  Vitals:   08/12/23 1615 08/12/23 1646  BP: 130/77 117/86  Pulse: 62 (!) 57  Resp: 17 12  Temp:    SpO2: 98% 100%    Last Pain:  Vitals:   08/12/23 1646  TempSrc:   PainSc: 4                  Rome Ade

## 2023-08-12 NOTE — Interval H&P Note (Signed)
 History and Physical Interval Note:  08/12/2023 1:33 PM  Beverly Barrett  has presented today for surgery, with the diagnosis of biliary dyskinesia.  The various methods of treatment have been discussed with the patient and family. After consideration of risks, benefits and other options for treatment, the patient has consented to  Procedure(s): XI ROBOTIC ASSISTED LAPAROSCOPIC CHOLECYSTECTOMY (N/A) INDOCYANINE GREEN  FLUORESCENCE IMAGING (ICG) (N/A) as a surgical intervention.  The patient's history has been reviewed, patient examined, no change in status, stable for surgery.  I have reviewed the patient's chart and labs.  Questions were answered to the patient's satisfaction.     Nieko Clarin

## 2023-08-12 NOTE — Anesthesia Procedure Notes (Signed)
 Procedure Name: Intubation Date/Time: 08/12/2023 2:23 PM  Performed by: Mathew Bernardino RAMAN, RNPre-anesthesia Checklist: Patient identified, Emergency Drugs available, Suction available and Patient being monitored Patient Re-evaluated:Patient Re-evaluated prior to induction Oxygen Delivery Method: Circle system utilized Preoxygenation: Pre-oxygenation with 100% oxygen Induction Type: IV induction Ventilation: Mask ventilation without difficulty and Oral airway inserted - appropriate to patient size Laryngoscope Size: McGrath and 3 Grade View: Grade III Tube type: Oral Number of attempts: 1 Airway Equipment and Method: Stylet and Oral airway Placement Confirmation: ETT inserted through vocal cords under direct vision, positive ETCO2 and breath sounds checked- equal and bilateral Secured at: 23 cm Tube secured with: Tape Dental Injury: Teeth and Oropharynx as per pre-operative assessment

## 2023-08-12 NOTE — Anesthesia Preprocedure Evaluation (Signed)
 Anesthesia Evaluation  Patient identified by MRN, date of birth, ID band Patient awake    Reviewed: Allergy & Precautions, NPO status , Patient's Chart, lab work & pertinent test results  Airway Mallampati: III  TM Distance: <3 FB Neck ROM: full    Dental  (+) Chipped, Poor Dentition   Pulmonary asthma , sleep apnea    Pulmonary exam normal        Cardiovascular Exercise Tolerance: Good hypertension, (-) angina (-) Past MI Normal cardiovascular exam     Neuro/Psych  Headaches PSYCHIATRIC DISORDERS       Neuromuscular disease    GI/Hepatic Neg liver ROS,GERD  Controlled,,  Endo/Other  diabetes, Type 2Hypothyroidism    Renal/GU      Musculoskeletal   Abdominal   Peds  Hematology negative hematology ROS (+)   Anesthesia Other Findings Past Medical History: No date: Anginal pain (HCC)     Comment:  none in approx 2 yrs No date: Anxiety No date: Asthma No date: Benign neoplasm of sigmoid colon No date: Chronic constipation No date: Chronic lower back pain 09/20/2016: Common migraine with intractable migraine No date: Depression     Comment:  suicidal ideation No date: GERD (gastroesophageal reflux disease) No date: Headache     Comment:  migraines -3x/wk No date: Hyperlipidemia No date: Hypertension No date: Hypothyroidism No date: Iron deficiency anemia No date: Morbid obesity with BMI of 40.0-44.9, adult (HCC) No date: Motion sickness     Comment:  cars No date: Multilevel degenerative disc disease No date: Multinodular goiter No date: Shortness of breath dyspnea No date: Sleep apnea     Comment:  CPAP No date: Type 2 diabetes mellitus with diabetic polyneuropathy,  without long-term current use of insulin  (HCC) No date: Vertigo No date: Vitamin D  deficiency No date: Wears dentures     Comment:  partial upper  Past Surgical History: 1996: ABDOMINAL HYSTERECTOMY 08/07/2023: BIOPSY     Comment:   Procedure: BIOPSY;  Surgeon: Unk Corinn Skiff, MD;                Location: [facility];  Service: Gastroenterology;; 1997: BLADDER SUSPENSION 1995: CESAREAN SECTION 05/29/2015: COLONOSCOPY WITH PROPOFOL ; N/A     Comment:  Procedure: COLONOSCOPY WITH PROPOFOL ;  Surgeon: Rogelia Copping, MD;  Location: [facility];  Service:               Endoscopy;  Laterality: N/A;  Latex allergy sleep apnea               - no CPAP machine (yet) 12/18/2021: COLONOSCOPY WITH PROPOFOL ; N/A     Comment:  Procedure: COLONOSCOPY WITH PROPOFOL ;  Surgeon: Unk Corinn Skiff, MD;  Location: [facility];  Service:               Gastroenterology;  Laterality: N/A; 08/07/2023: COLONOSCOPY WITH PROPOFOL ; N/A     Comment:  Procedure: COLONOSCOPY WITH PROPOFOL ;  Surgeon: Unk Corinn Skiff, MD;  Location: [facility];  Service:               Gastroenterology;  Laterality: N/A; No date: ECTOPIC PREGNANCY SURGERY 04/23/2017: ESOPHAGOGASTRODUODENOSCOPY; N/A     Comment:  Procedure: ESOPHAGOGASTRODUODENOSCOPY (EGD);  Surgeon:  Unk Corinn Skiff, MD;  Location: [facility];               Service: Gastroenterology;  Laterality: N/A;  Latex               allergy sleep apnea 08/07/2023: ESOPHAGOGASTRODUODENOSCOPY (EGD) WITH PROPOFOL ; N/A     Comment:  Procedure: ESOPHAGOGASTRODUODENOSCOPY (EGD) WITH               PROPOFOL ;  Surgeon: Unk Corinn Skiff, MD;  Location:               [facility];  Service: Gastroenterology;  Laterality:               N/A; 05/29/2015: POLYPECTOMY     Comment:  Procedure: POLYPECTOMY;  Surgeon: Rogelia Copping, MD;                Location: [facility];  Service: Endoscopy;; 08/07/2023: POLYPECTOMY     Comment:  Procedure: POLYPECTOMY;  Surgeon: Unk Corinn Skiff,               MD;  Location: [facility];  Service:               Gastroenterology;;  BMI    Body Mass Index: 42.32 kg/m       Reproductive/Obstetrics negative OB ROS                             Anesthesia Physical Anesthesia Plan  ASA: 3  Anesthesia Plan: General ETT   Post-op Pain Management:    Induction: Intravenous  PONV Risk Score and Plan: Ondansetron , Dexamethasone , Midazolam  and Treatment may vary due to age or medical condition  Airway Management Planned: Oral ETT  Additional Equipment:   Intra-op Plan:   Post-operative Plan: Extubation in OR  Informed Consent: I have reviewed the patients History and Physical, chart, labs and discussed the procedure including the risks, benefits and alternatives for the proposed anesthesia with the patient or authorized representative who has indicated his/her understanding and acceptance.     Dental Advisory Given  Plan Discussed with: Anesthesiologist, CRNA and Surgeon  Anesthesia Plan Comments: (Patient consented for risks of anesthesia including but not limited to:  - adverse reactions to medications - damage to eyes, teeth, lips or other oral mucosa - nerve damage due to positioning  - sore throat or hoarseness - Damage to heart, brain, nerves, lungs, other parts of body or loss of life  Patient voiced understanding and assent.)       Anesthesia Quick Evaluation

## 2023-08-12 NOTE — Op Note (Signed)
  Procedure Date:  08/12/2023  Pre-operative Diagnosis:  Biliary dyskinesia  Post-operative Diagnosis: Biliary dyskinesia  Procedure:  Robotic assisted cholecystectomy with ICG FireFly cholangiogram  Surgeon:  Aloysius Sheree Plant, MD  Anesthesia:  General endotracheal  Estimated Blood Loss:  10 ml  Specimens:  gallbladder  Complications:  None  Indications for Procedure:  This is a 63 y.o. female who presents with abdominal pain and workup revealing biliary dyskinesia.  The benefits, complications, treatment options, and expected outcomes were discussed with the patient. The risks of bleeding, infection, recurrence of symptoms, failure to resolve symptoms, bile duct damage, bile duct leak, retained common bile duct stone, bowel injury, and need for further procedures were all discussed with the patient and she was willing to proceed.  Description of Procedure: The patient was correctly identified in the preoperative area and brought into the operating room.  The patient was placed supine with VTE prophylaxis in place.  Appropriate time-outs were performed.  Anesthesia was induced and the patient was intubated.  Appropriate antibiotics were infused.  The abdomen was prepped and draped in a sterile fashion. An infraumbilical incision was made. A cutdown technique was used to enter the abdominal cavity without injury, and a 12 mm robotic port was inserted.  Pneumoperitoneum was obtained with appropriate opening pressures.  Three 8-mm ports were placed in the mid abdomen at the level of the umbilicus under direct visualization.  There were adhesions of the omentum to the left abdominal wall which were taken down using cautery.  This allowed placement of the left lateral port.  The DaVinci platform was docked, camera targeted, and instruments were placed under direct visualization.  The gallbladder was identified.  The fundus was grasped and retracted cephalad.  Adhesions were lysed bluntly and with  electrocautery. The infundibulum was grasped and retracted laterally, exposing the peritoneum overlying the gallbladder.  This was incised with electrocautery and extended on either side of the gallbladder.  FireFly cholangiogram was then obtained, and we were able to clearly identify the cystic duct and common bile duct.  The cystic duct and cystic artery were carefully dissected with combination of cautery and blunt dissection.  Both were clipped twice proximally and once distally, cutting in between.  The gallbladder was taken from the gallbladder fossa in a retrograde fashion with electrocautery. The gallbladder was placed in an Endocatch bag. The liver bed was inspected and any bleeding was controlled with electrocautery. The right upper quadrant was then inspected again revealing intact clips, no bleeding, and no ductal injury.  The 8 mm ports were removed under direct visualization and the 12 mm port was removed.  The Endocatch bag was brought out via the umbilical incision. The fascial opening was closed using 0 vicryl suture.  Local anesthetic was infused in all incisions and the incisions were closed with 4-0 Monocryl.  The wounds were cleaned and sealed with DermaBond.  The patient was emerged from anesthesia and extubated and brought to the recovery room for further management.  The patient tolerated the procedure well and all counts were correct at the end of the case.   Aloysius Sheree Plant, MD

## 2023-08-12 NOTE — Transfer of Care (Signed)
 Immediate Anesthesia Transfer of Care Note  Patient: Beverly Barrett  Procedure(s) Performed: XI ROBOTIC ASSISTED LAPAROSCOPIC CHOLECYSTECTOMY INDOCYANINE GREEN  FLUORESCENCE IMAGING (ICG)  Patient Location: PACU  Anesthesia Type:General  Level of Consciousness: drowsy  Airway & Oxygen Therapy: Patient Spontanous Breathing and Patient connected to face mask oxygen  Post-op Assessment: Report given to RN  Post vital signs: stable  Last Vitals:  Vitals Value Taken Time  BP 109/72 08/12/23 1555  Temp    Pulse 73 08/12/23 1557  Resp 22 08/12/23 1557  SpO2 99 % 08/12/23 1557  Vitals shown include unfiled device data.  Last Pain:  Vitals:   08/12/23 1203  TempSrc: Temporal  PainSc: 0-No pain         Complications: No notable events documented.

## 2023-08-14 LAB — SURGICAL PATHOLOGY

## 2023-08-15 ENCOUNTER — Other Ambulatory Visit: Payer: Self-pay | Admitting: Psychiatry

## 2023-08-15 DIAGNOSIS — F411 Generalized anxiety disorder: Secondary | ICD-10-CM

## 2023-08-16 ENCOUNTER — Other Ambulatory Visit: Payer: Self-pay | Admitting: Gastroenterology

## 2023-08-16 DIAGNOSIS — K219 Gastro-esophageal reflux disease without esophagitis: Secondary | ICD-10-CM

## 2023-08-20 ENCOUNTER — Ambulatory Visit: Payer: Commercial Managed Care - HMO | Admitting: Family Medicine

## 2023-08-20 ENCOUNTER — Encounter: Payer: Self-pay | Admitting: Family Medicine

## 2023-08-20 ENCOUNTER — Other Ambulatory Visit (HOSPITAL_COMMUNITY)
Admission: RE | Admit: 2023-08-20 | Discharge: 2023-08-20 | Disposition: A | Discharge status: Home / Self Care | Payer: Commercial Managed Care - HMO | Source: Ambulatory Visit | Attending: Family Medicine | Admitting: Family Medicine

## 2023-08-20 VITALS — BP 122/80 | HR 85 | Resp 16 | Ht 61.0 in | Wt 229.4 lb

## 2023-08-20 DIAGNOSIS — N760 Acute vaginitis: Secondary | ICD-10-CM

## 2023-08-20 DIAGNOSIS — Z9049 Acquired absence of other specified parts of digestive tract: Secondary | ICD-10-CM | POA: Diagnosis not present

## 2023-08-20 MED ORDER — FLUCONAZOLE 150 MG PO TABS: 150.0000 mg | ORAL_TABLET | ORAL | 0 refills | Status: DC

## 2023-08-20 NOTE — Progress Notes (Signed)
Name: Beverly Barrett   MRN: [id number]    DOB: [birth year]   Date:08/20/2023       Progress Note  Subjective  Chief Complaint  Chief Complaint  Patient presents with   Rash    After gallbladdeer surgery on the 14th, pt stated its near groin and vaginal are. It looks red, skin peeling off looking raw    Discussed the use of AI scribe software for clinical note transcription with the patient, who gave verbal consent to proceed.  History of Present Illness   The patient, who underwent a cholecystectomy on January 14th, developed a rash in the groin area four to five days post-surgery. The rash is described as painful and itchy, and despite the application of Desenex cream, it appears to be spreading and worsening. The patient has never experienced a rash of this nature before. The patient also reports internal vaginal irritation.  The patient's blood sugar has been well-controlled since the surgery. She has been taking pain medication (Oxycontin) post-surgery but has since stopped and switched to Ibuprofen. The patient has a history of taking Ibuprofen.  The patient has been passing gas regularly and denies constipation. She is trying to regain her normal eating habits post-surgery. There have been no reports of fever, chills, or nausea.        Patient Active Problem List   Diagnosis Date Noted   Biliary dyskinesia 08/12/2023   Gastric erythema 08/07/2023   Angina pectoris associated with type 2 diabetes mellitus (HCC) 12/04/2022   Insomnia due to medical condition 12/04/2022   Bereavement 12/04/2022   MDD (major depressive disorder), recurrent, in full remission (HCC) 12/26/2021   Polyp of colon    Chronic constipation 12/03/2021   Asthma, well controlled 12/03/2021   Migraine without aura and without status migrainosus, not intractable 12/03/2021   Memory loss 08/27/2021   Sleep disorder 07/19/2021   Dyslipidemia associated with type 2 diabetes mellitus (HCC) 03/06/2020   Chronic  low back pain 12/31/2018   Grade II internal hemorrhoids 05/16/2017   Mixed stress and urge urinary incontinence 11/12/2016   Degenerative arthritis of lumbar spine 11/12/2016   Common migraine with intractable migraine 09/20/2016   Iron deficiency anemia 09/12/2016   GAD (generalized anxiety disorder) 12/20/2015   OSA on CPAP 12/20/2015   Benign neoplasm of sigmoid colon    Chronic pain of multiple joints 06/14/2014   Arthritis, degenerative 06/14/2014   Morbid obesity with BMI of 40.0-44.9, adult (HCC) 06/14/2014   Multinodular goiter 08/18/2013   Hypertension goal BP (blood pressure) < 140/90 12/07/2010   Familial multiple lipoprotein-type hyperlipidemia 12/07/2010   Allergic rhinitis 10/13/2008    Social History   Tobacco Use   Smoking status: Never   Smokeless tobacco: Never  Substance Use Topics   Alcohol use: No    Alcohol/week: 0.0 standard drinks of alcohol     Current Outpatient Medications:    acetaminophen (TYLENOL) 500 MG tablet, Take 2 tablets (1,000 mg total) by mouth every 6 (six) hours as needed for mild pain (pain score 1-3)., Disp: , Rfl:    amLODipine (NORVASC) 2.5 MG tablet, Take 1 tablet (2.5 mg total) by mouth daily., Disp: 90 tablet, Rfl: 1   aspirin 81 MG EC tablet, Take 81 mg by mouth daily., Disp: , Rfl:    Cholecalciferol (VITAMIN D) 50 MCG (2000 UT) CAPS, Take 2,000 Units by mouth daily., Disp: , Rfl:    cloNIDine (CATAPRES) 0.1 MG tablet, Take 1 tablet (0.1 mg total) by mouth at  bedtime., Disp: 90 tablet, Rfl: 1   dimenhyDRINATE (DRAMAMINE) 50 MG tablet, Take 50 mg by mouth every 8 (eight) hours as needed for nausea., Disp: , Rfl:    fluticasone (FLONASE) 50 MCG/ACT nasal spray, Place 2 sprays into both nostrils daily as needed for allergies., Disp: 48 mL, Rfl: 0   Fluticasone Furoate (ARNUITY ELLIPTA) 100 MCG/ACT AEPB, Take 1 puff by mouth daily at 12 noon., Disp: 30 each, Rfl: 5   gabapentin (NEURONTIN) 400 MG capsule, Take 1 capsule in the  morning, take 2 at night, Disp: 270 capsule, Rfl: 1   Galcanezumab-gnlm (EMGALITY) 120 MG/ML SOAJ, Inject 120 mg into the skin every 30 (thirty) days., Disp: 1.12 mL, Rfl: 11   glucose blood test strip, Use as instructed, Disp: 100 each, Rfl: 12   ibuprofen (ADVIL) 600 MG tablet, Take 1 tablet (600 mg total) by mouth every 8 (eight) hours as needed for moderate pain (pain score 4-6)., Disp: 60 tablet, Rfl: 1   Iron-FA-B Cmp-C-Biot-Probiotic (FUSION PLUS) CAPS, Take 1 capsule by mouth daily for 60 doses., Disp: 30 capsule, Rfl: 1   Lancet Devices (ONE TOUCH DELICA LANCING DEV) MISC, 1 each by Does not apply route daily., Disp: 1 each, Rfl: 0   levothyroxine (SYNTHROID) 100 MCG tablet, Take 100 mcg by mouth daily before breakfast., Disp: , Rfl:    linaclotide (LINZESS) 145 MCG CAPS capsule, Take 1 capsule (145 mcg total) by mouth daily., Disp: 90 capsule, Rfl: 1   loratadine (CLARITIN) 10 MG tablet, Take 10 mg by mouth daily., Disp: , Rfl:    metFORMIN (GLUCOPHAGE-XR) 750 MG 24 hr tablet, Take 2 tablets (1,500 mg total) by mouth daily with breakfast., Disp: 180 tablet, Rfl: 1   metoCLOPramide (REGLAN) 5 MG tablet, Take 1 tablet (5 mg total) by mouth daily before supper. (Patient taking differently: Take 5 mg by mouth daily as needed for nausea or vomiting. Takes before supper if needed.), Disp: 90 tablet, Rfl: 1   metoprolol succinate (TOPROL-XL) 50 MG 24 hr tablet, TAKE 1 TABLET BY MOUTH ONCE A DAY WITH OR IMMEDIATELY FOLLOWING A MEAL., Disp: 90 tablet, Rfl: 1   mirtazapine (REMERON) 7.5 MG tablet, TAKE 1 TABLET BY MOUTH AT BEDTIME, Disp: 90 tablet, Rfl: 0   montelukast (SINGULAIR) 10 MG tablet, Take 1 tablet (10 mg total) by mouth at bedtime., Disp: 90 tablet, Rfl: 1   nitroGLYCERIN (NITROSTAT) 0.4 MG SL tablet, Place 1 tablet (0.4 mg total) under the tongue every 5 (five) minutes as needed., Disp: 25 tablet, Rfl: 0   olmesartan-hydrochlorothiazide (BENICAR HCT) 40-25 MG tablet, Take 1 tablet by mouth  daily., Disp: 90 tablet, Rfl: 1   omeprazole (PRILOSEC) 40 MG capsule, Take 1 capsule by mouth once daily, Disp: 90 capsule, Rfl: 3   ondansetron (ZOFRAN) 4 MG tablet, Take 1 tablet (4 mg total) by mouth every 8 (eight) hours as needed for nausea or vomiting., Disp: 30 tablet, Rfl: 2   OneTouch Delica Lancets 33G MISC, 1 each by Does not apply route daily., Disp: 100 each, Rfl: 2   oxyCODONE (OXY IR/ROXICODONE) 5 MG immediate release tablet, Take 1 tablet (5 mg total) by mouth every 4 (four) hours as needed for severe pain (pain score 7-10)., Disp: 30 tablet, Rfl: 0   pioglitazone (ACTOS) 15 MG tablet, Take 1 tablet (15 mg total) by mouth daily., Disp: 90 tablet, Rfl: 1   potassium chloride SA (KLOR-CON M) 20 MEQ tablet, Take 1 tablet (20 mEq total) by mouth daily.  Be sure to take dose on day of surgery. Follow up with PCP for repeat labs., Disp: 5 tablet, Rfl: 0   rizatriptan (MAXALT) 10 MG tablet, May repeat in 2 hours if needed (Patient taking differently: Take 10 mg by mouth as needed for migraine. May repeat in 2 hours if needed), Disp: 10 tablet, Rfl: 11   rosuvastatin (CRESTOR) 40 MG tablet, TAKE 1 TABLET BY MOUTH ONCE DAILY. IN PLACE OF ATORVASTATIN (Patient taking differently: Take 40 mg by mouth at bedtime.), Disp: 90 tablet, Rfl: 1   VENTOLIN HFA 108 (90 Base) MCG/ACT inhaler, INHALE 2 PUFFS BY MOUTH EVERY 6 HOURS AS NEEDED FOR WHEEZING OR SHORTNESS OF BREATH, Disp: 36 g, Rfl: 0  Allergies  Allergen Reactions   Latex Swelling    Pt unsure of reaction pt states something happened while in the hospital after surgery.    ROS  Ten systems reviewed and is negative except as mentioned in HPI    Objective  Vitals:   08/20/23 1540  BP: 122/80  Pulse: 85  Resp: 16  SpO2: 95%  Weight: 229 lb 6.4 oz (104.1 kg)  Height: 5\' 1"  (1.549 m)    Body mass index is 43.34 kg/m.    Physical Exam  Constitutional: Patient appears well-developed and well-nourished. Obese  No distress.   HEENT: head atraumatic, normocephalic, pupils equal and reactive to light, neck supple Cardiovascular: Normal rate, regular rhythm and normal heart sounds.  No murmur heard. No BLE edema. Pulmonary/Chest: Effort normal and breath sounds normal. No respiratory distress. Vulva : labia majora seems swollen, speculum exam not done but some white discharge noticed on introitus, some erythema extending to inner thigh, and some pilling of superficial skin extending from vulva to inner thigh, buttocks. No oozing or blisters Abdominal: Soft.  There is no tenderness. Psychiatric: Patient has a normal mood and affect. behavior is normal. Judgment and thought content normal.   Recent Results (from the past 2160 hours)  POCT glycosylated hemoglobin (Hb A1C)     Status: Abnormal   Collection Time: 07/18/23  9:33 AM  Result Value Ref Range   Hemoglobin A1C 6.0 (A) 4.0 - 5.6 %   HbA1c POC (<> result, manual entry)     HbA1c, POC (prediabetic range)     HbA1c, POC (controlled diabetic range)    Surgical pathology     Status: None   Collection Time: 08/07/23 12:00 AM  Result Value Ref Range   SURGICAL PATHOLOGY      SURGICAL PATHOLOGY [facility] [address], [address] [place], [place] [address] Telephone [phone] or [phone] Fax [phone]  REPORT OF SURGICAL PATHOLOGY   Accession #: [id number] Patient Name: TAMMRA, DOBISH Visit # : [id number]  MRN: [id number] Physician: Lannette Donath DOB/Age 12-02-1960 (Age: 25) Gender: F Collected Date: 08/07/2023 Received Date: 08/07/2023  FINAL DIAGNOSIS       1. Stomach, biopsy, CBX :       - ANTRAL MUCOSA WITH REACTIVE AND HEALING EROSIVE GASTRITIS.      - OXYNTIC MUCOSA WITH CHANGES CONSISTENT WITH PROTON PUMP INHIBITOR EFFECT.      - NEGATIVE FOR H. PYLORI, INTESTINAL METAPLASIA, DYSPLASIA, AND MALIGNANCY.       2. Transverse Colon Polyp, Cold snare x5 :       - TUBULAR ADENOMA (MULTIPLE FRAGMENTS).      -  NEGATIVE FOR HIGH-GRADE DYSPLASIA AND MALIGNANCY.       3. Descending Colon Polyp, Cold snare x2 :       -  TUBULAR ADENOMA (2).      - NEGATIVE FOR HIGH-GRADE DYSPLASIA AND MALIGNANCY.        4. Sigmoid  Colon Polyp, Cold snare :       - HYPERPLASTIC POLYP.      - NEGATIVE FOR DYSPLASIA AND MALIGNANCY.       ELECTRONIC SIGNATURE : Rubinas Md, Delice Bison , Sports administrator, Electronic Signature  MICROSCOPIC DESCRIPTION  CASE COMMENTS STAINS USED IN DIAGNOSIS: H&E H&E H&E H&E    CLINICAL HISTORY  SPECIMEN(S) OBTAINED 1. Stomach, biopsy, CBX 2. Transverse Colon Polyp, Cold Snare X5 3. Descending Colon Polyp, Cold Snare X2 4. Sigmoid  Colon Polyp, Cold Snare  SPECIMEN COMMENTS: 1. For IDA SPECIMEN CLINICAL INFORMATION: 1. Gastric erythema    Gross Description 1. "Gastric CBX for IDA", received in formalin is a 0.5 x 0.5 x 0.2 cm aggregate of 3 soft, tan tissue fragments submitted entirely in block 1A. 2. "Cold snare transverse colon polyp x5", received in formalin is a 2.0 x 2.0 x 0.2 cm aggregate of multiple tan polypoid tissue fragments submitted entirely in block 2A. 3. "Cold snare descending colon polyp x2", received in formalin is a 1.0  x 1.0 x 0.2 cm aggregate of multiple tan polypoid tissue fragments submitted entirely in block 3A. 4. "Cold snare sigmoid colon polyp", received in formalin is a 0.4 x 0.2 x 0.1 cm aggregate of 2 tan polypoid tissue fragments submitted entirely in block 4A.      SMB      08/07/2023        Report signed out from the following location(s) [place] [address], [place] [address] CLIA #: [id number]  [facility] [address] [place], [place] [address] CLIA #: [id number]   Glucose, capillary     Status: Abnormal   Collection Time: 08/07/23  9:53 AM  Result Value Ref Range   Glucose-Capillary 114 (H) 70 - 99 mg/dL    Comment: Glucose reference range applies only to samples  taken after fasting for at least 8 hours.  Basic metabolic panel per protocol     Status: Abnormal   Collection Time: 08/08/23 10:12 AM  Result Value Ref Range   Sodium 141 135 - 145 mmol/L   Potassium 3.1 (L) 3.5 - 5.1 mmol/L   Chloride 107 98 - 111 mmol/L   CO2 25 22 - 32 mmol/L   Glucose, Bld 105 (H) 70 - 99 mg/dL    Comment: Glucose reference range applies only to samples taken after fasting for at least 8 hours.   BUN 12 8 - 23 mg/dL   Creatinine, Ser 4.25 0.44 - 1.00 mg/dL   Calcium 8.8 (L) 8.9 - 10.3 mg/dL   GFR, Estimated >95 >63 mL/min    Comment: (NOTE) Calculated using the CKD-EPI Creatinine Equation (2021)    Anion gap 9 5 - 15    Comment: Performed at [facility], [address] [place], [place] [address]  CBC per protocol     Status: Abnormal   Collection Time: 08/08/23 10:12 AM  Result Value Ref Range   WBC 6.8 4.0 - 10.5 K/uL   RBC 4.12 3.87 - 5.11 MIL/uL   Hemoglobin 11.7 (L) 12.0 - 15.0 g/dL   HCT 33.2 (L) 95.1 - 88.4 %   MCV 84.2 80.0 - 100.0 fL   MCH 28.4 26.0 - 34.0 pg   MCHC 33.7 30.0 - 36.0 g/dL   RDW 16.6 06.3 - 01.6 %  Platelets 308 150 - 400 K/uL   nRBC 0.0 0.0 - 0.2 %    Comment: Performed at [facility], [address] [place] [place], [place] [address]  Surgical pathology     Status: None   Collection Time: 08/12/23 12:00 AM  Result Value Ref Range   SURGICAL PATHOLOGY      SURGICAL PATHOLOGY [facility] [address] [place], [place] [address] Telephone [phone] or [phone] Fax [phone]  REPORT OF SURGICAL PATHOLOGY   Accession #: [id number] Patient Name: LEELANI, NEDLEY Visit # : [id number]  MRN: [id number] Physician: Henrene Dodge DOB/Age [birth year] (Age: 40) Gender: F Collected Date: 08/12/2023 Received Date: 08/13/2023  FINAL DIAGNOSIS       1. Gallbladder,  :       -  CHRONIC CHOLECYSTITIS       DATE SIGNED OUT: 08/14/2023 ELECTRONIC SIGNATURE : Corey Harold  M.D., Dossie Arbour., Pathologist, Electronic Signature  MICROSCOPIC DESCRIPTION  CASE COMMENTS STAINS USED IN DIAGNOSIS: H&E    CLINICAL HISTORY  SPECIMEN(S) OBTAINED 1. Gallbladder,  SPECIMEN COMMENTS: SPECIMEN CLINICAL INFORMATION: 1. Biliary dyskinesia    Gross Description 1. Size/?Intact: 8.5 x 3.8 cm; intact      Serosal surface: Green-gray, mildly wrinkled, and glistening with a mildly      roughened  hepatic bed.      Mucosa/Wall: Bile-stained, velvety mucosa; 0.1 cm thick wall (average). Discrete      lesions are grossly absent.      Contents:  Moderate amount of tenacious, dark green bile distending the lumen;      stones are absent.      Cystic duct: 0.1 cm diameter, patent; received with a white, plastic clamp;      inked black.      Block Summary: Representative sections including the cystic duct margin (en      face) are submitted in 1 block (1A).      AMG 08/13/2023        Report signed out from the following location(s) [place] [address], [place] [address] CLIA #: [id number]  [facility] [address] [place], [place] [address] CLIA #: [id number]   I-STAT, chem 8     Status: Abnormal   Collection Time: 08/12/23 12:13 PM  Result Value Ref Range   Sodium 142 135 - 145 mmol/L   Potassium 3.6 3.5 - 5.1 mmol/L   Chloride 105 98 - 111 mmol/L   BUN 8 8 - 23 mg/dL   Creatinine, Ser 5.46 0.44 - 1.00 mg/dL   Glucose, Bld 270 (H) 70 - 99 mg/dL    Comment: Glucose reference range applies only to samples taken after fasting for at least 8 hours.   Calcium, Ion 1.16 1.15 - 1.40 mmol/L   TCO2 25 22 - 32 mmol/L   Hemoglobin 11.9 (L) 12.0 - 15.0 g/dL   HCT 35.0 (L) 09.3 - 81.8 %  Glucose, capillary     Status: Abnormal   Collection Time: 08/12/23  3:58 PM  Result Value Ref Range   Glucose-Capillary 124 (H) 70 - 99 mg/dL    Comment: Glucose reference range applies only to samples taken after fasting for  at least 8 hours.     Assessment and Plan    Post-operative Vulvovaginitis Rash in the groin area 4-5 days post cholecystectomy. Symptoms include pain, itching, and peeling skin. No improvement with Desitin cream. Vaginal swab taken to rule out yeast and  bacterial vaginosis. -Start Diflucan, one pill every other day for three days. -Continue Desitin cream. -Review swab results when available and treat accordingly.  Post-operative Recovery Patient is passing gas and not constipated, indicating good recovery from cholecystectomy. -Continue current management.

## 2023-08-22 ENCOUNTER — Encounter: Payer: Self-pay | Admitting: Family Medicine

## 2023-08-22 LAB — CERVICOVAGINAL ANCILLARY ONLY
Bacterial Vaginitis (gardnerella): NEGATIVE
Candida Glabrata: NEGATIVE
Candida Vaginitis: POSITIVE — AB
Comment: NEGATIVE
Comment: NEGATIVE
Comment: NEGATIVE

## 2023-08-26 ENCOUNTER — Telehealth: Payer: Self-pay | Admitting: *Deleted

## 2023-08-26 NOTE — Telephone Encounter (Signed)
Faxed ([employer]) FMLA to [employer] at [phone]

## 2023-08-27 ENCOUNTER — Encounter: Payer: Self-pay | Admitting: Physician Assistant

## 2023-08-27 ENCOUNTER — Ambulatory Visit (INDEPENDENT_AMBULATORY_CARE_PROVIDER_SITE_OTHER): Payer: Commercial Managed Care - HMO | Admitting: Physician Assistant

## 2023-08-27 VITALS — BP 123/75 | HR 75 | Temp 98.0°F | Ht 61.0 in | Wt 228.0 lb

## 2023-08-27 DIAGNOSIS — Z09 Encounter for follow-up examination after completed treatment for conditions other than malignant neoplasm: Secondary | ICD-10-CM

## 2023-08-27 DIAGNOSIS — K828 Other specified diseases of gallbladder: Secondary | ICD-10-CM

## 2023-08-27 NOTE — Patient Instructions (Signed)

## 2023-08-27 NOTE — Progress Notes (Signed)
[facility] POST-OP OFFICE VISIT  08/27/2023  HPI: Beverly Barrett is a 63 y.o. female 15 days s/p robotic assisted laparoscopic cholecystectomy for biliary dyskinesia with Dr Aleen Campi   She has done well First few days were rough; now with umbilical soreness only No fever, chills Still with nausea/emesis but this is similar to symptoms she has had for a long time prior to surgery Tolerating PO; no diarrhea Incisions are healing well She is ambulating No other complaints   Vital signs: BP 123/75   Pulse 75   Temp 98 F (36.7 C)   Ht 5\' 1"  (1.549 m)   Wt 228 lb (103.4 kg)   SpO2 95%   BMI 43.08 kg/m    Physical Exam: Constitutional: Well appearing female, NAD Abdomen: Soft, umbilical soreness expectedly, non-distended, no rebound/guarding Skin: Laparoscopic incisions are healing well, no erythema or drainage   Assessment/Plan: This is a 63 y.o. female 15 days s/p robotic assisted laparoscopic cholecystectomy for biliary dyskinesia with Dr Aleen Campi    - Pain control prn  - Reviewed wound care recommendation  - Reviewed lifting restrictions; 4 weeks total  - Reviewed surgical pathology; chronic cholecystitis   - She can follow up on as needed basis; She understands to call with questions/concerns  -- Lynden Oxford, PA-C [facility] 08/27/2023, 1:42 PM M-F: 7am - 4pm

## 2023-09-03 ENCOUNTER — Other Ambulatory Visit: Payer: Self-pay | Admitting: Pharmacist

## 2023-09-03 DIAGNOSIS — I10 Essential (primary) hypertension: Secondary | ICD-10-CM

## 2023-09-03 NOTE — Progress Notes (Signed)
 09/03/2023 Name: Beverly Barrett MRN: [id number] DOB: 05/11/1961  Chief Complaint  Patient presents with   Medication Management   Medication Assistance    Beverly Barrett is a 63 y.o. year old female who presented for a telephone visit.   They were referred to the pharmacist by their PCP for assistance in managing medication access.    Subjective:  Care Team: Primary Care Provider: Sowles, Krichna, MD ; Next Scheduled Visit: 11/20/2023 GI Specialist: Unk Corinn Skiff, MD Neurologist: Gayland Lauraine PARAS, NP Endocrinologist: Cherilyn Debby Quivers, MD Psychiatrist: Coby Height, MD   Medication Access/Adherence  Current Pharmacy:  [facility] [address] ([address]), [place] - [address] [place]) [place] [address] Phone: [phone] Fax: [phone]  [facility] - [place], [place] - [address] [place] [address] Phone: [phone] Fax: [phone]   Patient reports affordability concerns with their medications: Yes  Patient reports access/transportation concerns to their pharmacy: No  Patient reports adherence concerns with their medications:  No    Reports cost of her medications and medical visit have increased recently as Medicaid ended unexpectedly last month. She received a letter stating that she could have her [employer] or Medicaid and had to select one, but then instead the Medicaid coverage ended last month. Said that medical costs and prescription costs are now difficult to afford and she is confused about what she might qualify for/if she can get Medicaid back  - Reports currently has 1 month supply of her medications, including her Arnuity inhaler, remaining  Patient reports that she recently had a cholecystectomy in January and is healing well, but continuing to have nausea and stomach not feeling well and would like to follow up with Dr.  Unk   Asthma:   Current medications:  - montelukast  10 mg nightly - Arnuity Ellipta  100 mcg/act - 1 puff daily             Confirms rinsing mouth and spitting out after each use - Albuterol  HFA - 2 puffs every 6 hours as needed for wheezing or shortness of breath   Medications tried in the past: Flovent  HFA (cost); Qvar (did not work well for patient)   Reports improvement in her breathing since started using Arnuity. Denies needing albuterol  inhaler recently     Hypertension:   Current medications:  - amlodipine  2.5 mg daily - clonidine  0.1 mg nightly at bedtime - metoprolol  ER 50 mg daily - olmesartan -HCTZ 40-25 mg daily   Reports has been monitoring her home BP and readings have been good, but has not been writing these down recently   Objective:  Lab Results  Component Value Date   HGBA1C 6.0 (A) 07/18/2023    Lab Results  Component Value Date   CREATININE 0.70 08/12/2023   BUN 8 08/12/2023   NA 142 08/12/2023   K 3.6 08/12/2023   CL 105 08/12/2023   CO2 25 08/08/2023    Lab Results  Component Value Date   CHOL 136 04/04/2023   HDL 57 04/04/2023   LDLCALC 57 04/04/2023   TRIG 136 04/04/2023   CHOLHDL 2.4 04/04/2023   BP Readings from Last 3 Encounters:  08/27/23 123/75  08/20/23 122/80  08/12/23 (!) 118/57   Pulse Readings from Last 3 Encounters:  08/27/23 75  08/20/23 85  08/12/23 (!) 58     Current Outpatient Medications on File Prior to Visit  Medication Sig Dispense Refill  amLODipine  (NORVASC ) 2.5 MG tablet Take 1 tablet (2.5 mg total) by mouth daily. 90 tablet 1   cloNIDine  (CATAPRES ) 0.1 MG tablet Take 1 tablet (0.1 mg total) by mouth at bedtime. 90 tablet 1   metoprolol  succinate (TOPROL -XL) 50 MG 24 hr tablet TAKE 1 TABLET BY MOUTH ONCE A DAY WITH OR IMMEDIATELY FOLLOWING A MEAL. 90 tablet 1   olmesartan -hydrochlorothiazide  (BENICAR  HCT) 40-25 MG tablet Take 1 tablet by mouth daily. 90 tablet 1   acetaminophen  (TYLENOL ) 500 MG  tablet Take 2 tablets (1,000 mg total) by mouth every 6 (six) hours as needed for mild pain (pain score 1-3).     aspirin  81 MG EC tablet Take 81 mg by mouth daily.     Cholecalciferol (VITAMIN D ) 50 MCG (2000 UT) CAPS Take 2,000 Units by mouth daily.     dimenhyDRINATE (DRAMAMINE) 50 MG tablet Take 50 mg by mouth every 8 (eight) hours as needed for nausea.     fluconazole  (DIFLUCAN ) 150 MG tablet Take 1 tablet (150 mg total) by mouth every other day. 3 tablet 0   fluticasone  (FLONASE ) 50 MCG/ACT nasal spray Place 2 sprays into both nostrils daily as needed for allergies. 48 mL 0   Fluticasone  Furoate (ARNUITY ELLIPTA ) 100 MCG/ACT AEPB Take 1 puff by mouth daily at 12 noon. 30 each 5   gabapentin  (NEURONTIN ) 400 MG capsule Take 1 capsule in the morning, take 2 at night 270 capsule 1   Galcanezumab -gnlm (EMGALITY ) 120 MG/ML SOAJ Inject 120 mg into the skin every 30 (thirty) days. 1.12 mL 11   glucose blood test strip Use as instructed 100 each 12   ibuprofen  (ADVIL ) 600 MG tablet Take 1 tablet (600 mg total) by mouth every 8 (eight) hours as needed for moderate pain (pain score 4-6). 60 tablet 1   Iron-FA-B Cmp-C-Biot-Probiotic (FUSION PLUS) CAPS Take 1 capsule by mouth daily for 60 doses. 30 capsule 1   Lancet Devices (ONE TOUCH DELICA LANCING DEV) MISC 1 each by Does not apply route daily. 1 each 0   levothyroxine  (SYNTHROID ) 100 MCG tablet Take 100 mcg by mouth daily before breakfast.     linaclotide  (LINZESS ) 145 MCG CAPS capsule Take 1 capsule (145 mcg total) by mouth daily. 90 capsule 1   loratadine (CLARITIN) 10 MG tablet Take 10 mg by mouth daily.     metFORMIN  (GLUCOPHAGE -XR) 750 MG 24 hr tablet Take 2 tablets (1,500 mg total) by mouth daily with breakfast. 180 tablet 1   metoCLOPramide  (REGLAN ) 5 MG tablet Take 1 tablet (5 mg total) by mouth daily before supper. (Patient taking differently: Take 5 mg by mouth daily as needed for nausea or vomiting. Takes before supper if needed.) 90 tablet  1   mirtazapine  (REMERON ) 7.5 MG tablet TAKE 1 TABLET BY MOUTH AT BEDTIME 90 tablet 0   montelukast  (SINGULAIR ) 10 MG tablet Take 1 tablet (10 mg total) by mouth at bedtime. 90 tablet 1   nitroGLYCERIN  (NITROSTAT ) 0.4 MG SL tablet Place 1 tablet (0.4 mg total) under the tongue every 5 (five) minutes as needed. 25 tablet 0   omeprazole  (PRILOSEC) 40 MG capsule Take 1 capsule by mouth once daily 90 capsule 3   ondansetron  (ZOFRAN ) 4 MG tablet Take 1 tablet (4 mg total) by mouth every 8 (eight) hours as needed for nausea or vomiting. 30 tablet 2   OneTouch Delica Lancets 33G MISC 1 each by Does not apply route daily. 100 each 2   pioglitazone  (ACTOS ) 15  MG tablet Take 1 tablet (15 mg total) by mouth daily. 90 tablet 1   potassium chloride  SA (KLOR-CON  M) 20 MEQ tablet Take 1 tablet (20 mEq total) by mouth daily. Be sure to take dose on day of surgery. Follow up with PCP for repeat labs. 5 tablet 0   rizatriptan  (MAXALT ) 10 MG tablet May repeat in 2 hours if needed (Patient taking differently: Take 10 mg by mouth as needed for migraine. May repeat in 2 hours if needed) 10 tablet 11   rosuvastatin  (CRESTOR ) 40 MG tablet TAKE 1 TABLET BY MOUTH ONCE DAILY. IN PLACE OF ATORVASTATIN  (Patient taking differently: Take 40 mg by mouth at bedtime.) 90 tablet 1   VENTOLIN  HFA 108 (90 Base) MCG/ACT inhaler INHALE 2 PUFFS BY MOUTH EVERY 6 HOURS AS NEEDED FOR WHEEZING OR SHORTNESS OF BREATH 36 g 0   No current facility-administered medications on file prior to visit.       Assessment/Plan:   Provide patient with the phone number for Dr. Kayla office and encourage patient to contact office today as states that she wants to schedule follow up with Dr. Unk regarding ongoing nausea/upset stomach since cholecystectomy in January  Place referral to LCSW as requested by patient for support in understanding if she is still eligible for Medicaid/determine if support available to help patient manage medical  costs  Asthma: - Reviewed appropriate inhaler technique. Encourage patient to continue to use maintenance inhaler consistently During next appointment will evaluate if change to maintenance inhaler therapy is needed (depending on patient's prescription coverage)     Hypertension: - Reviewed appropriate blood pressure monitoring technique and reviewed goal blood pressure. Recommended to check home blood pressure and heart rate, keep log of results and have this record to review at upcoming medical appointments. Patient to contact provider office sooner if needed for readings outside of established parameters or symptoms     Follow Up Plan: Clinical Pharmacist will follow up with patient by telephone on 10/01/2023 at 2:30 PM     Sharyle Sia, PharmD, [facility] [phone]

## 2023-09-03 NOTE — Patient Instructions (Signed)
 Goals Addressed             This Visit's Progress    Pharmacy Goals       Please be sure to use your Arnuity Ellipta  inhaler everyday to control your symptoms. Please remember to rinse your mouth thoroughly with water  and spit out after each use.   Please let me know if your have any further medication questions or concerns!   Sharyle Sia, PharmD, [facility] [phone]

## 2023-09-08 ENCOUNTER — Ambulatory Visit: Payer: Self-pay | Admitting: *Deleted

## 2023-09-08 NOTE — Patient Outreach (Signed)
  Care Coordination   Initial Visit Note   09/08/2023 Name: Beverly Barrett MRN: [id number] DOB: 1961-07-11  Beverly Barrett is a 63 y.o. year old female who sees Beverly Lacer, Barrett for primary care. I spoke with  Beverly Barrett by phone today.  What matters to the patients health and wellness today?  Medicaid coverage and eligibility. . Per patient, she received a letter stating that her medicaid was cancelled. Her primary coverage is now Beverly Barrett. Patient's daughter is assisting with this process, however it is not clear what was discovered regarding the status of her coverage. CSW to contact patient's daughter to clarify.   Goals Addressed             This Visit's Progress    Clarification regarding medicaid coverage       Activities and task to complete in order to accomplish goals.   TASK TO ACCOMPLISH GOAL Call or go to Department of Social Services to discuss medicaid coverage and eligibility Keep all upcoming appointment discussed today Continue with compliance of taking medication prescribed by Doctor         SDOH assessments and interventions completed:  Yes  SDOH Interventions Today    Flowsheet Row Most Recent Value  SDOH Interventions   Food Insecurity Interventions Intervention Not Indicated  Housing Interventions Intervention Not Indicated  Transportation Interventions Intervention Not Indicated  Utilities Interventions Intervention Not Indicated        Care Coordination Interventions:  Yes, provided  Interventions Today    Flowsheet Row Most Recent Value  Chronic Disease   Chronic disease during today's visit Diabetes  General Interventions   General Interventions Discussed/Reviewed General Interventions Discussed  [patient assessed for Beverly resource needs. Patient states that she was previously covered under Medicaid, however received a letter stating that it is now discontinued. Pt is now covered by a Beverly Barrett-requesting assistance with  explanation]  Education Interventions   Education Provided Provided Education  Provided Verbal Education On Applications  [encouraged patient to contact the Dept Social Services for clarification regarding termination of benefits and/or coverage eligibility]       Follow up Barrett: Follow up call scheduled for 09/10/23    Encounter Outcome:  Patient Visit Completed

## 2023-09-08 NOTE — Patient Instructions (Signed)
 Visit Information  Thank you for taking time to visit with me today. Please don't hesitate to contact me if I can be of assistance to you.   Following are the goals we discussed today:   Goals Addressed             This Visit's Progress    Clarification regarding medicaid coverage       Activities and task to complete in order to accomplish goals.   TASK TO ACCOMPLISH GOAL Call or go to Department of Social Services to discuss medicaid coverage and eligibility Keep all upcoming appointment discussed today Continue with compliance of taking medication prescribed by Doctor         Our next appointment is by telephone on 09/10/23 at 4:30pm  Please call the care guide team at [phone] if you need to cancel or reschedule your appointment.   If you are experiencing a Mental Health or Behavioral Health Crisis or need someone to talk to, please call 911   Patient verbalizes understanding of instructions and care plan provided today and agrees to view in MyChart. Active MyChart status and patient understanding of how to access instructions and care plan via MyChart confirmed with patient.     Telephone follow up appointment with care management team member scheduled for: 09/10/23  Michaelle Adolphus, LCSW [place]  [facility], [facility] Licensed Clinical Social Worker Care Coordinator  Direct Dial: [phone]

## 2023-09-10 ENCOUNTER — Telehealth: Payer: Self-pay | Admitting: *Deleted

## 2023-09-10 ENCOUNTER — Encounter: Payer: Self-pay | Admitting: *Deleted

## 2023-09-10 NOTE — Patient Outreach (Signed)
  Care Coordination   09/10/2023 Name: Beverly Barrett MRN: [id number] DOB: [birth year]   Care Coordination Outreach Attempts:  An unsuccessful telephone outreach was attempted today to offer the patient information about available complex care management services.  Follow Up Plan:  Additional outreach attempts will be made to offer the patient complex care management information and services.   Encounter Outcome:  No Answer   Care Coordination Interventions:  No, not indicated    Ilene Witcher, LCSW [place]  [facility], [facility] Licensed Clinical Social Worker Care Coordinator  Direct Dial: [phone]

## 2023-09-12 ENCOUNTER — Ambulatory Visit: Payer: Self-pay | Admitting: *Deleted

## 2023-09-12 NOTE — Patient Outreach (Signed)
  Care Coordination   Follow Up Visit Note   09/12/2023 Name: Beverly Barrett MRN: [id number] DOB: 04-02-1961  Beverly Barrett is a 63 y.o. year old female who sees Alba Cory, MD for primary care. I spoke with  Melodye Ped by phone today.  What matters to the patients health and wellness today?  Patient's insurance coverage confirmed. Patient no longer meets the income requirement for medicaid. Patient now covered under insurance received through the market place. Patient verbalized having no additional needs at this time.    Goals Addressed             This Visit's Progress    Clarification regarding medicaid coverage       Activities and task to complete in order to accomplish goals.   TASK TO ACCOMPLISH GOAL Confirmed that patient is no longer eligible for medicaid to change in income-currently covered under insurance received through the [employer] Keep all upcoming appointment discussed today Continue with compliance of taking medication prescribed by Doctor         SDOH assessments and interventions completed:  No     Care Coordination Interventions:  Yes, provided  Interventions Today    Flowsheet Row Most Recent Value  Chronic Disease   Chronic disease during today's visit Diabetes  General Interventions   General Interventions Discussed/Reviewed General Interventions Reviewed, Level of Care  [Follow up with patient regarding insurance coverage and medicaid eligibility-confirmed that patient's income has increased which affects her eligibility for medicaid. Pt now covered by insurance received thorugh the market place]  Level of Care Applications  Applications Medicaid  [Confirmed with patient's daughter that patient started receiving additional income-medicaid was terminated due to current coverage through the market place]  Education Interventions   Applications Medicaid  [Confirmed with patient's daughter that patient started receiving additional  income-medicaid was terminated due to current coverage through the market place]       Follow up plan: No further intervention required.   Encounter Outcome:  Patient Visit Completed

## 2023-09-12 NOTE — Patient Instructions (Signed)
Visit Information  Thank you for taking time to visit with me today. Please don't hesitate to contact me if I can be of assistance to you.   Following are the goals we discussed today:   Goals Addressed             This Visit's Progress    COMPLETED: Clarification regarding medicaid coverage       Activities and task to complete in order to accomplish goals.   TASK TO ACCOMPLISH GOAL Confirmed that patient is no longer eligible for medicaid to change in income-currently covered under insurance received through the [employer] Keep all upcoming appointment discussed today Continue with compliance of taking medication prescribed by Doctor        If you are experiencing a Mental Health or Behavioral Health Crisis or need someone to talk to, please call 911   Patient verbalizes understanding of instructions and care plan provided today and agrees to view in MyChart. Active MyChart status and patient understanding of how to access instructions and care plan via MyChart confirmed with patient.     No further follow up required: patient  to contact this Child psychotherapist with any additional community resource needs.  Chyane Greer, LCSW [place]  [facility], [facility] Licensed Clinical Social Worker Care Coordinator  Direct Dial: [phone]

## 2023-10-01 ENCOUNTER — Other Ambulatory Visit: Payer: Self-pay | Admitting: Pharmacist

## 2023-10-01 ENCOUNTER — Encounter: Payer: Self-pay | Admitting: Family Medicine

## 2023-10-01 DIAGNOSIS — I10 Essential (primary) hypertension: Secondary | ICD-10-CM

## 2023-10-01 NOTE — Progress Notes (Unsigned)
 10/01/2023 Name: Beverly Barrett MRN: [id number] DOB: 02-Jul-1961  Chief Complaint  Patient presents with   Medication Management   Medication Assistance    Beverly Barrett is a 63 y.o. year old female who presented for a telephone visit.   They were referred to the pharmacist by {referredtopharmacy:27270} for assistance in managing {referralreason:27271}.    Subjective:  Care Team: Primary Care Provider: Alba Cory, MD ; Next Scheduled Visit: *** {careteamprovider:27366}  Medication Access/Adherence  Current Pharmacy:  [facility] [address] ([address]), [place] - [address] [place]) [place] [address] Phone: [phone] Fax: [phone]  [facility] - [place], [place] - [address] [place] [address] Phone: [phone] Fax: [phone]   Patient reports affordability concerns with their medications: Yes  Patient reports access/transportation concerns to their pharmacy: No  Patient reports adherence concerns with their medications:  No    Uses weekly pillbox with support of daughter/husband. Reports misses a dose ~once/week  From review of chart, note patient seen in [facility] ED on 09/28/2023   Asthma:   Current medications:  - montelukast 10 mg nightly - Arnuity Ellipta 100 mcg/act - 1 puff daily             Confirms rinsing mouth and spitting out after each use - Albuterol HFA - 2 puffs every 6 hours as needed for wheezing or shortness of breath   Medications tried in the past: Flovent HFA (cost); Qvar (did not work well for patient)   Reports improvement in her breathing since started using Arnuity. Denies needing albuterol inhaler recently    Hypertension:   Current medications:  - amlodipine 2.5 mg daily - clonidine 0.1 mg nightly at bedtime - metoprolol ER 50 mg daily - olmesartan-HCTZ 40-25 mg daily   Reports has been monitoring her home BP  and reading today:120/87  Diabetes:  Current medications:  - metformin ER 750 mg -2 tablets daily with breakfast - pioglitazone 15 mg daily  Current glucose readings: last checked this morning, reading: 99  Patient denies hypoglycemic s/sx including dizziness, shakiness, sweating. Patient   Current physical activity: ***  Objective:  Lab Results  Component Value Date   HGBA1C 6.0 (A) 07/18/2023    Lab Results  Component Value Date   CREATININE 0.70 08/12/2023   BUN 8 08/12/2023   NA 142 08/12/2023   K 3.6 08/12/2023   CL 105 08/12/2023   CO2 25 08/08/2023    Lab Results  Component Value Date   CHOL 136 04/04/2023   HDL 57 04/04/2023   LDLCALC 57 04/04/2023   TRIG 136 04/04/2023   CHOLHDL 2.4 04/04/2023   BP Readings from Last 3 Encounters:  08/27/23 123/75  08/20/23 122/80  08/12/23 (!) 118/57   Pulse Readings from Last 3 Encounters:  08/27/23 75  08/20/23 85  08/12/23 (!) 58     Medications Reviewed Today   Medications were not reviewed in this encounter       Assessment/Plan:   Advise patient to contact office today to schedule post-ED follow up appointment with PCP - Confirms that she will reach out today to schedule and also plans to discuss concern about potential yeast infection  Outreach to [employer] today on behalf of patient.  - Provide pharmacy with copay savings card for Arnuity inhaler  Rx BIN:  [id number]  PCN: [id number]  Group: [id number]  ID: [id number]  Expiration: 07/28/2024  - Provide  pharmacy with copay savings card for [employer]:  Rx BIN: [id number]  PCN: [id number]  Group: [id number]  ID: [id number]  Expiration: 07/28/2024  {Pharmacy A/P Choices:26421}  Follow Up Plan: ***  ***

## 2023-10-02 ENCOUNTER — Other Ambulatory Visit (HOSPITAL_COMMUNITY)
Admission: RE | Admit: 2023-10-02 | Discharge: 2023-10-02 | Disposition: A | Discharge status: Home / Self Care | Source: Ambulatory Visit | Attending: Nurse Practitioner | Admitting: Nurse Practitioner

## 2023-10-02 ENCOUNTER — Encounter: Payer: Self-pay | Admitting: Nurse Practitioner

## 2023-10-02 ENCOUNTER — Ambulatory Visit: Admitting: Nurse Practitioner

## 2023-10-02 VITALS — BP 126/80 | HR 76 | Temp 97.9°F | Resp 18 | Ht 61.0 in | Wt 223.9 lb

## 2023-10-02 DIAGNOSIS — B379 Candidiasis, unspecified: Secondary | ICD-10-CM | POA: Diagnosis not present

## 2023-10-02 DIAGNOSIS — K5792 Diverticulitis of intestine, part unspecified, without perforation or abscess without bleeding: Secondary | ICD-10-CM | POA: Diagnosis not present

## 2023-10-02 DIAGNOSIS — N9489 Other specified conditions associated with female genital organs and menstrual cycle: Secondary | ICD-10-CM | POA: Insufficient documentation

## 2023-10-02 DIAGNOSIS — T3695XA Adverse effect of unspecified systemic antibiotic, initial encounter: Secondary | ICD-10-CM

## 2023-10-02 MED ORDER — FLUCONAZOLE 150 MG PO TABS: 150.0000 mg | ORAL_TABLET | ORAL | 0 refills | Status: DC | PRN

## 2023-10-02 NOTE — Progress Notes (Signed)
 BP 126/80   Pulse 76   Temp 97.9 F (36.6 C)   Resp 18   Ht 5\' 1"  (1.549 m)   Wt 223 lb 14.4 oz (101.6 kg)   SpO2 98%   BMI 42.31 kg/m    Subjective:    Patient ID: Harpreet Signore, female    DOB: 1960/08/21, 63 y.o.   MRN: [id number]  HPI: Camrin Lapre is a 63 y.o. female  Chief Complaint  Patient presents with   Follow-up    ER diverticulisitis has 5 more day of antibiotic    Vaginal Itching    Has yeast infection from antibiotic    Discussed the use of AI scribe software for clinical note transcription with the patient, who gave verbal consent to proceed.  History of Present Illness   Tremeka Helbling "Annice Pih" is a 63 year old female who presents for an ER follow-up after being diagnosed with diverticulitis.  She was seen in the ER on September 28, 2023, for abdominal pain and was diagnosed with diverticulitis. She was started on Augmentin and has been taking the antibiotic as prescribed.  She continues to experience persistent abdominal pain, describing it as 'it just hurt'. She reports that it has gotten better but she is still having pain.  She manages nausea by drinking soda water and is currently on a liquid diet to help manage her symptoms. No fever has been reported. Since starting the antibiotic, she believes she has developed a yeast infection, similar to a previous experience when she was treated with antibiotics for a gallbladder issue.    ED Course: ED Course as of 09/29/23 0219  Mon Sep 29, 2023  0138 CT Abdomen Pelvis W IV Contrast Only IMPRESSION: Acute uncomplicated diverticulitis of the distal descending colon. Additional chronic and incidental findings as detailed in the body of the report.  0202 Patient has remained hemodynamically stable and her laboratory workup is reassuring. She does not have significant comorbidities and I do believe she warrants a trial of p.o. antibiotics outpatient as opposed to admission at this time. Discussed this with  the patient and she agrees. Plan to discharge on Augmentin. Strict return precautions were discussed and all questions were answered. Patient verbalizes understanding of this and agrees with this plan.      08/20/2023    3:32 PM 07/18/2023    9:32 AM 04/30/2023   12:06 PM  Depression screen PHQ 2/9  Decreased Interest 2 1   Down, Depressed, Hopeless 2 1   PHQ - 2 Score 4 2   Altered sleeping 2 1   Tired, decreased energy 2 1   Change in appetite 2 1   Feeling bad or failure about yourself  0 1   Trouble concentrating 0 1   Moving slowly or fidgety/restless 0 0   Suicidal thoughts 0 0   PHQ-9 Score 10 7   Difficult doing work/chores Somewhat difficult Very difficult      Information is confidential and restricted. Go to Review Flowsheets to unlock data.    Relevant past medical, surgical, family and social history reviewed and updated as indicated. Interim medical history since our last visit reviewed. Allergies and medications reviewed and updated.  Review of Systems  Ten systems reviewed and is negative except as mentioned in HPI      Objective:    BP 126/80   Pulse 76   Temp 97.9 F (36.6 C)   Resp 18   Ht 5\' 1"  (1.549 m)  Wt 223 lb 14.4 oz (101.6 kg)   SpO2 98%   BMI 42.31 kg/m    Wt Readings from Last 3 Encounters:  10/02/23 223 lb 14.4 oz (101.6 kg)  08/27/23 228 lb (103.4 kg)  08/20/23 229 lb 6.4 oz (104.1 kg)    Physical Exam Vitals reviewed.  Constitutional:      Appearance: Normal appearance.  HENT:     Head: Normocephalic.  Cardiovascular:     Rate and Rhythm: Normal rate.  Pulmonary:     Effort: Pulmonary effort is normal.  Abdominal:     Palpations: Abdomen is soft.     Tenderness: There is no abdominal tenderness.  Neurological:     General: No focal deficit present.     Mental Status: She is alert and oriented to person, place, and time. Mental status is at baseline.  Psychiatric:        Mood and Affect: Mood normal.        Behavior:  Behavior normal.        Thought Content: Thought content normal.        Judgment: Judgment normal.    Results for orders placed or performed in visit on 08/20/23  Cervicovaginal ancillary only   Collection Time: 08/20/23  4:19 PM  Result Value Ref Range   Bacterial Vaginitis (gardnerella) Negative    Candida Vaginitis Positive (A)    Candida Glabrata Negative    Comment Normal Reference Range Candida Species - Negative    Comment Normal Reference Range Candida Galbrata - Negative    Comment      Normal Reference Range Bacterial Vaginosis - Negative       Assessment & Plan:   Problem List Items Addressed This Visit   None Visit Diagnoses       Vaginal burning    -  Primary   Relevant Medications   fluconazole (DIFLUCAN) 150 MG tablet   Other Relevant Orders   Cervicovaginal ancillary only     Antibiotic-induced yeast infection       Relevant Medications   fluconazole (DIFLUCAN) 150 MG tablet     Diverticulitis            Assessment and Plan    Diverticulitis Diagnosed with diverticulitis. Symptoms improving. Discussed risk of complications  - Maintain liquid diet until pain improves. - Educated on signs of worsening condition and advised ER visit if symptoms worsen.  Antibiotic-associated yeast infection Symptoms suggestive of yeast infection secondary to antibiotic use. - Prescribe antifungal medication.        Follow up plan: Return if symptoms worsen or fail to improve.

## 2023-10-02 NOTE — Patient Instructions (Signed)
 Goals Addressed             This Visit's Progress    Pharmacy Goals       Please be sure to use your Arnuity Ellipta inhaler everyday to control your symptoms. Please remember to rinse your mouth thoroughly with water and spit out after each use.   Check your blood pressure twice weekly, and any time you have concerning symptoms like headache, chest pain, dizziness, shortness of breath, or vision changes.   Our goal is less than 130/80.  To appropriately check your blood pressure, make sure you do the following:  1) Avoid caffeine, exercise, or tobacco products for 30 minutes before checking. Empty your bladder. 2) Sit with your back supported in a flat-backed chair. Rest your arm on something flat (arm of the chair, table, etc). 3) Sit still with your feet flat on the floor, resting, for at least 5 minutes.  4) Check your blood pressure. Take 1-2 readings.  5) Write down these readings and bring with you to any provider appointments.  Bring your home blood pressure machine with you to a provider's office for accuracy comparison at least once a year.   Make sure you take your blood pressure medications before you come to any office visit, even if you were asked to fast for labs.   Please let me know if your have any further medication questions or concerns!   Estelle Grumbles, PharmD, [facility] [phone]

## 2023-10-03 ENCOUNTER — Encounter: Payer: Self-pay | Admitting: Nurse Practitioner

## 2023-10-03 LAB — CERVICOVAGINAL ANCILLARY ONLY
Bacterial Vaginitis (gardnerella): NEGATIVE
Candida Glabrata: NEGATIVE
Candida Vaginitis: POSITIVE — AB
Comment: NEGATIVE
Comment: NEGATIVE
Comment: NEGATIVE

## 2023-10-13 ENCOUNTER — Other Ambulatory Visit: Payer: Self-pay | Admitting: Physician Assistant

## 2023-10-13 ENCOUNTER — Other Ambulatory Visit: Payer: Self-pay | Admitting: Family Medicine

## 2023-10-13 DIAGNOSIS — I152 Hypertension secondary to endocrine disorders: Secondary | ICD-10-CM

## 2023-10-13 DIAGNOSIS — J4540 Moderate persistent asthma, uncomplicated: Secondary | ICD-10-CM

## 2023-10-13 DIAGNOSIS — R110 Nausea: Secondary | ICD-10-CM

## 2023-10-20 ENCOUNTER — Other Ambulatory Visit: Payer: Self-pay

## 2023-10-20 ENCOUNTER — Encounter: Payer: Self-pay | Admitting: Gastroenterology

## 2023-10-20 ENCOUNTER — Ambulatory Visit: Payer: Commercial Managed Care - HMO | Admitting: Gastroenterology

## 2023-10-20 VITALS — BP 130/85 | HR 74 | Temp 97.4°F | Ht 61.0 in | Wt 220.0 lb

## 2023-10-20 DIAGNOSIS — D509 Iron deficiency anemia, unspecified: Secondary | ICD-10-CM

## 2023-10-20 DIAGNOSIS — K5909 Other constipation: Secondary | ICD-10-CM | POA: Diagnosis not present

## 2023-10-20 DIAGNOSIS — Z8719 Personal history of other diseases of the digestive system: Secondary | ICD-10-CM | POA: Diagnosis not present

## 2023-10-20 DIAGNOSIS — K5904 Chronic idiopathic constipation: Secondary | ICD-10-CM

## 2023-10-20 NOTE — Progress Notes (Signed)
 Arlyss Repress, MD [address]  [address]  [place], [place] [address]  Main: [phone]  Fax: [phone]    Gastroenterology Consultation  Referring Provider:     Alba Cory, MD Primary Care Physician:  Alba Cory, MD Primary Gastroenterologist:  Dr. Arlyss Repress Reason for Consultation: Acute diverticulitis        HPI:   Beverly Barrett is a 63 y.o. female referred by Dr. Alba Cory, MD  for consultation & management of chronic constipation and GERD.  Patient reports that she has been experiencing severe constipation, having hard bowel movement about once a week associated with significant straining.  Scant blood on wiping.  She lost about 10 pounds intentionally.  She has been experiencing epigastric pain associated with nausea.  Patient is taking MiraLAX with no relief.  Labs revealed no evidence of anemia.  Follow-up visit 06/27/2021 Patient is here for follow-up of constipation and GERD.  She tried Linzess 145 MCG samples which helped, currently on prescription.  Reports having bowel movements daily.  She is concerned about nausea occurring on a daily basis not associated with vomiting.  She takes omeprazole 40 mg daily for chronic GERD.  Patient is gaining weight.  Patient is accompanied by her daughter today.  Follow-up visit 10/22/2021 Patient is here for follow-up of constipation and upper abdominal pain.  She ran out of Linzess refills due to change in her pharmacy coverage.  She is taking MiraLAX but reports irregular bowel habits associated with significant straining, abdominal bloating.  She also reports upper abdominal discomfort.  She is taking omeprazole 40 mg daily.  Follow-up visit 05/06/2022 Patient is here for follow-up of new onset of nausea and early satiety.  Her symptoms started about a week ago, she started feeling nauseous when she wakes up in the morning, and reports feeling full before finishing her meal, drinks baking soda which  helps to finish her meal.  She continues to take omeprazole 40 mg daily before breakfast.  Her weight has been stable.  She reports that the Linzess has been helping and moves her bowels regularly.  She denies any abdominal pain or back pain.  Her hemoglobin A1c has been stable and gradually improving.  She denies any change in her diabetes medications.  Follow-up visit 07/02/2023 Beverly Barrett is here for follow-up of chronic symptoms of nausea, worse postprandial and she also reports tightness in her central abdomen more towards the left flank area.  She denies any right upper quadrant discomfort.  She reports eating small meal at a time only.  Her diabetes is fairly under control.  She continues to take omeprazole 40 mg once a day.  She does have mild chronic iron deficiency with low normal hemoglobin.  Follow-up visit 10/20/2023 Beverly Barrett is here for follow-up of recent episode of diffuse abdominal pain, went to [facility] ER on 3/2, had mild leukocytosis, normal serum lipase, normal LFTs, found to have acute uncomplicated diverticulitis of the distal descending colon.  She was discharged home on Augmentin She underwent laparoscopic cholecystectomy for biliary dyskinesia on 08/12/2023 Patient reports that she has been suffering from constipation postoperatively, currently having regular bowel movements.  She lost about 8 pounds, trying to eat healthy and significantly cut back on eating red meat.  She has been taking Linzess 145 mcg daily, continues to take omeprazole 40 mg once a day  NSAIDs: None  Antiplts/Anticoagulants/Anti thrombotics: None  Patient does not smoke or drink alcohol.  She denies family history  of GU malignancy  GI Procedures:  Upper endoscopy and colonoscopy 08/07/23 - Normal duodenal bulb and second portion of the duodenum. - Erythematous mucosa in the antrum. Biopsied. - Normal gastric body and incisura. Biopsied. - Normal gastroesophageal junction and esophagus.  - Eight 3 to 5 mm  polyps in the sigmoid colon, in the descending colon and in the transverse colon, removed with a cold snare. Resected and retrieved. - The distal rectum and anal verge are normal on retroflexion view.  - Diverticulosis in the recto- sigmoid colon and in the sigmoid colon.  FINAL DIAGNOSIS       1. Stomach, biopsy, CBX :      - ANTRAL MUCOSA WITH REACTIVE AND HEALING EROSIVE GASTRITIS.      - OXYNTIC MUCOSA WITH CHANGES CONSISTENT WITH PROTON PUMP INHIBITOR EFFECT.      - NEGATIVE FOR H. PYLORI, INTESTINAL METAPLASIA, DYSPLASIA, AND MALIGNANCY.       2. Transverse Colon Polyp, Cold snare x5 :      - TUBULAR ADENOMA (MULTIPLE FRAGMENTS).      - NEGATIVE FOR HIGH-GRADE DYSPLASIA AND MALIGNANCY.       3. Descending Colon Polyp, Cold snare x2 :      - TUBULAR ADENOMA (2).      - NEGATIVE FOR HIGH-GRADE DYSPLASIA AND MALIGNANCY.       4. Sigmoid  Colon Polyp, Cold snare :      - HYPERPLASTIC POLYP.      - NEGATIVE FOR DYSPLASIA AND MALIGNANCY.  Upper endoscopy 04/23/2017 - Normal duodenal bulb and second portion of the duodenum. - A single gastric polyp. Resected and retrieved. - Erythematous mucosa in the gastric body. Biopsied. - Normal gastroesophageal junction and esophagus.    Colonoscopy 05/29/2015 - Two 2 to 3 mm polyps in the sigmoid colon. Resected and retrieved. - Diverticulosis in the entire examined colon. - Non-bleeding internal hemorrhoids. Colon, polyp(s), sigmoid - SMALL AMOUNTS OF FRAGMENTED SUPERFICIAL BENIGN COLONIC EPITHELIUM. - NO INTACT COLONIC MUCOSA OR POLYPOID ARCHITECTURE IDENTIFIED. - NO ADENOMATOUS CHANGE OR MALIGNANCY IDENTIFIED.  Past Medical History:  Diagnosis Date   Anginal pain (HCC)    none in approx 2 yrs   Anxiety    Asthma    Benign neoplasm of sigmoid colon    Chronic constipation    Chronic lower back pain    Common migraine with intractable migraine 09/20/2016   Depression    suicidal ideation   GERD (gastroesophageal reflux  disease)    Headache    migraines -3x/wk   Hyperlipidemia    Hypertension    Hypothyroidism    Iron deficiency anemia    Morbid obesity with BMI of 40.0-44.9, adult (HCC)    Motion sickness    cars   Multilevel degenerative disc disease    Multinodular goiter    Shortness of breath dyspnea    Sleep apnea    CPAP   Type 2 diabetes mellitus with diabetic polyneuropathy, without long-term current use of insulin (HCC)    Vertigo    Vitamin D deficiency    Wears dentures    partial upper    Past Surgical History:  Procedure Laterality Date   ABDOMINAL HYSTERECTOMY  1996   BIOPSY  08/07/2023   Procedure: BIOPSY;  Surgeon: Toney Reil, MD;  Location: [facility] ENDOSCOPY;  Service: Gastroenterology;;   BLADDER SUSPENSION  1997   CESAREAN SECTION  1995   CHOLECYSTECTOMY     COLONOSCOPY WITH PROPOFOL N/A 05/29/2015   Procedure: COLONOSCOPY  WITH PROPOFOL;  Surgeon: Midge Minium, MD;  Location: [facility];  Service: Endoscopy;  Laterality: N/A;  Latex allergy sleep apnea - no CPAP machine (yet)   COLONOSCOPY WITH PROPOFOL N/A 12/18/2021   Procedure: COLONOSCOPY WITH PROPOFOL;  Surgeon: Toney Reil, MD;  Location: [facility];  Service: Gastroenterology;  Laterality: N/A;   COLONOSCOPY WITH PROPOFOL N/A 08/07/2023   Procedure: COLONOSCOPY WITH PROPOFOL;  Surgeon: Toney Reil, MD;  Location: [facility] ENDOSCOPY;  Service: Gastroenterology;  Laterality: N/A;   ECTOPIC PREGNANCY SURGERY     ESOPHAGOGASTRODUODENOSCOPY N/A 04/23/2017   Procedure: ESOPHAGOGASTRODUODENOSCOPY (EGD);  Surgeon: Toney Reil, MD;  Location: [facility];  Service: Gastroenterology;  Laterality: N/A;  Latex allergy sleep apnea   ESOPHAGOGASTRODUODENOSCOPY (EGD) WITH PROPOFOL N/A 08/07/2023   Procedure: ESOPHAGOGASTRODUODENOSCOPY (EGD) WITH PROPOFOL;  Surgeon: Toney Reil, MD;  Location: [facility] ENDOSCOPY;  Service: Gastroenterology;  Laterality: N/A;   POLYPECTOMY   05/29/2015   Procedure: POLYPECTOMY;  Surgeon: Midge Minium, MD;  Location: [facility];  Service: Endoscopy;;   POLYPECTOMY  08/07/2023   Procedure: POLYPECTOMY;  Surgeon: Toney Reil, MD;  Location: [facility] ENDOSCOPY;  Service: Gastroenterology;;   Current Outpatient Medications:    acetaminophen (TYLENOL) 500 MG tablet, Take 2 tablets (1,000 mg total) by mouth every 6 (six) hours as needed for mild pain (pain score 1-3)., Disp: , Rfl:    amLODipine (NORVASC) 2.5 MG tablet, Take 1 tablet (2.5 mg total) by mouth daily., Disp: 90 tablet, Rfl: 1   aspirin 81 MG EC tablet, Take 81 mg by mouth daily., Disp: , Rfl:    Cholecalciferol (VITAMIN D) 50 MCG (2000 UT) CAPS, Take 2,000 Units by mouth daily., Disp: , Rfl:    cloNIDine (CATAPRES) 0.1 MG tablet, TAKE 1 TABLET BY MOUTH AT BEDTIME, Disp: 90 tablet, Rfl: 0   dimenhyDRINATE (DRAMAMINE) 50 MG tablet, Take 50 mg by mouth every 8 (eight) hours as needed for nausea., Disp: , Rfl:    fluticasone (FLONASE) 50 MCG/ACT nasal spray, Place 2 sprays into both nostrils daily as needed for allergies., Disp: 48 mL, Rfl: 0   Fluticasone Furoate (ARNUITY ELLIPTA) 100 MCG/ACT AEPB, Take 1 puff by mouth daily at 12 noon., Disp: 30 each, Rfl: 5   gabapentin (NEURONTIN) 400 MG capsule, Take 1 capsule in the morning, take 2 at night, Disp: 270 capsule, Rfl: 1   Galcanezumab-gnlm (EMGALITY) 120 MG/ML SOAJ, Inject 120 mg into the skin every 30 (thirty) days., Disp: 1.12 mL, Rfl: 11   glucose blood test strip, Use as instructed, Disp: 100 each, Rfl: 12   ibuprofen (ADVIL) 600 MG tablet, Take 1 tablet (600 mg total) by mouth every 8 (eight) hours as needed for moderate pain (pain score 4-6)., Disp: 60 tablet, Rfl: 1   Iron-FA-B Cmp-C-Biot-Probiotic (FUSION PLUS) CAPS, Take 1 capsule by mouth daily., Disp: , Rfl:    Lancet Devices (ONE TOUCH DELICA LANCING DEV) MISC, 1 each by Does not apply route daily., Disp: 1 each, Rfl: 0   levothyroxine (SYNTHROID) 100 MCG  tablet, Take 100 mcg by mouth daily before breakfast., Disp: , Rfl:    linaclotide (LINZESS) 145 MCG CAPS capsule, Take 1 capsule (145 mcg total) by mouth daily., Disp: 90 capsule, Rfl: 1   loratadine (CLARITIN) 10 MG tablet, Take 10 mg by mouth daily., Disp: , Rfl:    metFORMIN (GLUCOPHAGE-XR) 750 MG 24 hr tablet, Take 2 tablets (1,500 mg total) by mouth daily with breakfast., Disp: 180 tablet, Rfl: 1  metoCLOPramide (REGLAN) 5 MG tablet, Take 1 tablet (5 mg total) by mouth daily before supper. (Patient taking differently: Take 5 mg by mouth daily as needed for nausea or vomiting. Takes before supper if needed.), Disp: 90 tablet, Rfl: 1   metoprolol succinate (TOPROL-XL) 50 MG 24 hr tablet, TAKE 1 TABLET BY MOUTH ONCE A DAY WITH OR IMMEDIATELY FOLLOWING A MEAL., Disp: 90 tablet, Rfl: 1   mirtazapine (REMERON) 7.5 MG tablet, TAKE 1 TABLET BY MOUTH AT BEDTIME, Disp: 90 tablet, Rfl: 0   montelukast (SINGULAIR) 10 MG tablet, TAKE 1 TABLET BY MOUTH AT BEDTIME, Disp: 90 tablet, Rfl: 0   nitroGLYCERIN (NITROSTAT) 0.4 MG SL tablet, Place 1 tablet (0.4 mg total) under the tongue every 5 (five) minutes as needed., Disp: 25 tablet, Rfl: 0   olmesartan-hydrochlorothiazide (BENICAR HCT) 40-25 MG tablet, Take 1 tablet by mouth daily., Disp: 90 tablet, Rfl: 1   omeprazole (PRILOSEC) 40 MG capsule, Take 1 capsule by mouth once daily, Disp: 90 capsule, Rfl: 3   ondansetron (ZOFRAN) 4 MG tablet, TAKE 1 TABLET BY MOUTH EVERY 8 HOURS AS NEEDED FOR NAUSEA FOR VOMITING, Disp: 30 tablet, Rfl: 0   OneTouch Delica Lancets 33G MISC, 1 each by Does not apply route daily., Disp: 100 each, Rfl: 2   pioglitazone (ACTOS) 15 MG tablet, Take 1 tablet (15 mg total) by mouth daily., Disp: 90 tablet, Rfl: 1   rizatriptan (MAXALT) 10 MG tablet, May repeat in 2 hours if needed (Patient taking differently: Take 10 mg by mouth as needed for migraine. May repeat in 2 hours if needed), Disp: 10 tablet, Rfl: 11   rosuvastatin (CRESTOR) 40 MG  tablet, TAKE 1 TABLET BY MOUTH ONCE DAILY. IN PLACE OF ATORVASTATIN (Patient taking differently: Take 40 mg by mouth at bedtime.), Disp: 90 tablet, Rfl: 1   VENTOLIN HFA 108 (90 Base) MCG/ACT inhaler, INHALE 2 PUFFS BY MOUTH EVERY 6 HOURS AS NEEDED FOR WHEEZING OR SHORTNESS OF BREATH, Disp: 36 g, Rfl: 0    Family History  Problem Relation Age of Onset   Diabetes Sister    Anxiety disorder Sister    Depression Sister    Hypertension Sister    Cirrhosis Mother    Diabetes Mother    Cirrhosis Father    Breast cancer Sister 36   Heart attack Brother    Alcohol abuse Brother    Drug abuse Brother    Prostate cancer Neg Hx    Kidney cancer Neg Hx    Bladder Cancer Neg Hx      Social History   Tobacco Use   Smoking status: Never    Passive exposure: Never   Smokeless tobacco: Never  Vaping Use   Vaping status: Never Used  Substance Use Topics   Alcohol use: No    Alcohol/week: 0.0 standard drinks of alcohol   Drug use: No    Allergies as of 10/20/2023 - Review Complete 10/20/2023  Allergen Reaction Noted   Latex Swelling 04/24/2015    Review of Systems:    All systems reviewed and negative except where noted in HPI.   Physical Exam:  BP 130/85 (BP Location: Left Arm, Patient Position: Sitting, Cuff Size: Large)   Pulse 74   Temp (!) 97.4 F (36.3 C) (Oral)   Ht 5\' 1"  (1.549 m)   Wt 220 lb (99.8 kg)   BMI 41.57 kg/m  No LMP recorded. Patient has had a hysterectomy.  General:   Alert,  Well-developed, well-nourished, pleasant and  cooperative in NAD Head:  Normocephalic and atraumatic. Eyes:  Sclera clear, no icterus.   Conjunctiva pink. Ears:  Normal auditory acuity. Nose:  No deformity, discharge, or lesions. Mouth:  No deformity or lesions,oropharynx pink & moist. Neck:  Supple; no masses or thyromegaly. Lungs:  Respirations even and unlabored.  Clear throughout to auscultation.   No wheezes, crackles, or rhonchi. No acute distress. Heart:  Regular rate and  rhythm; no murmurs, clicks, rubs, or gallops. Abdomen:  Normal bowel sounds. Soft, obese, non-tender and non-distended without masses, hepatosplenomegaly or hernias noted.  No guarding or rebound tenderness.   Rectal: Not performed Msk:  Symmetrical without gross deformities. Good, equal movement & strength bilaterally. Pulses:  Normal pulses noted. Extremities:  No clubbing or edema.  No cyanosis. Neurologic:  Alert and oriented x3;  grossly normal neurologically. Skin:  Intact without significant lesions or rashes. No jaundice. Psych:  Alert and cooperative. Normal mood and affect.  Imaging Studies:   Assessment and Plan:   Beverly Barrett is a 63 y.o. pleasant African-American female with metabolic syndrome, BMI 41.57, chronic GERD, chronic constipation on Linzess, biliary dyskinesia s/p laparoscopic cholecystectomy in 07/2023 [facility] for follow-up of acute uncomplicated distal descending colon diverticulitis in early March 2025 treated with Augmentin  Acute uncomplicated diverticulitis Discussed with patient regarding healthy lifestyle, keep bowels regular, continue fiber intake Discussed supportive adequate intake of water  Chronic constipation under control Continue Linzess 145 MCG daily Reiterated on high-fiber diet and adequate intake of water  Mild iron deficiency anemia upper endoscopy and colonoscopy were unremarkable She denies frequent use of Goody powders Continue fusion plus  Follow up as needed  Arlyss Repress, MD

## 2023-10-29 ENCOUNTER — Ambulatory Visit (INDEPENDENT_AMBULATORY_CARE_PROVIDER_SITE_OTHER): Payer: Self-pay | Admitting: Psychiatry

## 2023-10-29 ENCOUNTER — Other Ambulatory Visit: Payer: Self-pay

## 2023-10-29 ENCOUNTER — Encounter: Payer: Self-pay | Admitting: Psychiatry

## 2023-10-29 VITALS — BP 127/81 | HR 72 | Temp 97.6°F | Ht 61.0 in | Wt 221.4 lb

## 2023-10-29 DIAGNOSIS — F411 Generalized anxiety disorder: Secondary | ICD-10-CM | POA: Diagnosis not present

## 2023-10-29 DIAGNOSIS — F3342 Major depressive disorder, recurrent, in full remission: Secondary | ICD-10-CM | POA: Diagnosis not present

## 2023-10-29 DIAGNOSIS — G4701 Insomnia due to medical condition: Secondary | ICD-10-CM

## 2023-10-29 MED ORDER — MIRTAZAPINE 15 MG PO TABS: 15.0000 mg | ORAL_TABLET | Freq: Every day | ORAL | 1 refills | Status: DC
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
## 2023-11-20 ENCOUNTER — Encounter: Payer: Self-pay | Admitting: Family Medicine

## 2023-11-20 ENCOUNTER — Ambulatory Visit: Payer: Self-pay | Admitting: Family Medicine

## 2023-11-20 VITALS — BP 126/80 | HR 91 | Resp 16 | Ht 61.0 in | Wt 218.9 lb

## 2023-11-20 DIAGNOSIS — F330 Major depressive disorder, recurrent, mild: Secondary | ICD-10-CM

## 2023-11-20 DIAGNOSIS — I209 Angina pectoris, unspecified: Secondary | ICD-10-CM

## 2023-11-20 DIAGNOSIS — E1159 Type 2 diabetes mellitus with other circulatory complications: Secondary | ICD-10-CM | POA: Diagnosis not present

## 2023-11-20 DIAGNOSIS — E039 Hypothyroidism, unspecified: Secondary | ICD-10-CM

## 2023-11-20 DIAGNOSIS — E785 Hyperlipidemia, unspecified: Secondary | ICD-10-CM | POA: Diagnosis not present

## 2023-11-20 DIAGNOSIS — E1169 Type 2 diabetes mellitus with other specified complication: Secondary | ICD-10-CM | POA: Diagnosis not present

## 2023-11-20 DIAGNOSIS — K5904 Chronic idiopathic constipation: Secondary | ICD-10-CM

## 2023-11-20 DIAGNOSIS — G4733 Obstructive sleep apnea (adult) (pediatric): Secondary | ICD-10-CM

## 2023-11-20 DIAGNOSIS — I152 Hypertension secondary to endocrine disorders: Secondary | ICD-10-CM

## 2023-11-20 DIAGNOSIS — Z6841 Body Mass Index (BMI) 40.0 and over, adult: Secondary | ICD-10-CM

## 2023-11-20 DIAGNOSIS — J4540 Moderate persistent asthma, uncomplicated: Secondary | ICD-10-CM

## 2023-11-20 DIAGNOSIS — G43009 Migraine without aura, not intractable, without status migrainosus: Secondary | ICD-10-CM

## 2023-11-20 MED ORDER — CLONIDINE HCL 0.1 MG PO TABS: 0.1000 mg | ORAL_TABLET | Freq: Every day | ORAL | 1 refills | Status: DC

## 2023-11-20 MED ORDER — AMLODIPINE BESYLATE 2.5 MG PO TABS
2.5000 mg | ORAL_TABLET | Freq: Every day | ORAL | 1 refills | Status: DC
Start: 2023-11-20 — End: 2024-05-25

## 2023-11-20 MED ORDER — MONTELUKAST SODIUM 10 MG PO TABS: 10.0000 mg | ORAL_TABLET | Freq: Every day | ORAL | 1 refills | Status: DC

## 2023-11-20 MED ORDER — LINACLOTIDE 145 MCG PO CAPS: 145.0000 ug | ORAL_CAPSULE | Freq: Every day | ORAL | 1 refills | Status: DC

## 2023-11-20 MED ORDER — ROSUVASTATIN CALCIUM 40 MG PO TABS: 40.0000 mg | ORAL_TABLET | Freq: Every day | ORAL | 1 refills | Status: DC

## 2023-11-20 MED ORDER — OLMESARTAN MEDOXOMIL-HCTZ 40-25 MG PO TABS: 1.0000 | ORAL_TABLET | Freq: Every day | ORAL | 1 refills | Status: DC

## 2023-11-20 MED ORDER — METFORMIN HCL ER 750 MG PO TB24: 1500.0000 mg | ORAL_TABLET | Freq: Every day | ORAL | 1 refills | Status: DC

## 2023-11-20 MED ORDER — ARNUITY ELLIPTA 100 MCG/ACT IN AEPB: 1.0000 | INHALATION_SPRAY | Freq: Every day | RESPIRATORY_TRACT | 5 refills | Status: DC

## 2023-11-20 NOTE — Progress Notes (Signed)
 Name: Beverly Barrett   MRN: [id number]    DOB: [birth year]   Date:11/20/2023       Progress Note  Subjective  Chief Complaint  Chief Complaint  Patient presents with   Medical Management of Chronic Issues   Discussed the use of AI scribe software for clinical note transcription with the patient, who gave verbal consent to proceed.  History of Present Illness Beverly Barrett "Beverly Barrett" is a 63 year old female who presents for a routine follow-up visit.  She has type 2 diabetes managed with pioglitazone  15 mg and metformin  750 mg twice daily. Her home glucose readings are generally below 140 mg/dL, with occasional readings as high as 140 mg/dL. Her last A1c was 6.0% in December. She experiences polydipsia and polyuria, which have been consistent over time. She has associated dyslipidemia, HTN and angina pectoris ( but no recent episodes of chest pain)   Her hypertension is managed with amlodipine  2.5 mg, clonidine  0.1 mg at night, metoprolol  50 mg, and olmesartan  HCTZ 40/25 mg once daily, along with aspirin . She experiences exertional dyspnea, requiring breaks during walking, but no recent chest pain.  Hypothyroidism is managed with levothyroxine  100 mcg daily, with her last TSH within normal limits. She reports no new symptoms related to her thyroid  condition.  For dyslipidemia, she takes rosuvastatin  40 mg daily, with cholesterol levels within target range as of September.  She has obstructive sleep apnea and uses a CPAP machine nightly, which has reduced her headaches and snoring. She is seeking a new ENT provider due to insurance changes and needs CPAP supplies.  Major depressive disorder is treated with mirtazapine  15 mg at night, which helps with depression and sleep. She has experienced some insomnia, leading to an adjustment in her medication dosage.  Her asthma is managed with Arnuity daily, with improvement in her cough and only occasional wheezing, primarily with activity.  For  migraine headaches, she uses Emgality  injections monthly and Maxalt  for acute episodes. She notes an increase in migraine frequency due to stress at home.  She takes Linzess  for chronic idiopathic constipation, which is effective. Omeprazole  is used for reflux, which is well-controlled.  She has a history of morbid obesity with a BMI over 41, but has lost approximately 5 pounds over the past year, from 224 lbs to 218.9 lbs.    Patient Active Problem List   Diagnosis Date Noted   Biliary dyskinesia 08/12/2023   Gastric erythema 08/07/2023   Angina pectoris associated with type 2 diabetes mellitus (HCC) 12/04/2022   Insomnia due to medical condition 12/04/2022   Bereavement 12/04/2022   MDD (major depressive disorder), recurrent, in full remission (HCC) 12/26/2021   Polyp of colon    Chronic constipation 12/03/2021   Asthma, well controlled 12/03/2021   Migraine without aura and without status migrainosus, not intractable 12/03/2021   Memory loss 08/27/2021   Sleep disorder 07/19/2021   Dyslipidemia associated with type 2 diabetes mellitus (HCC) 03/06/2020   Chronic low back pain 12/31/2018   Grade II internal hemorrhoids 05/16/2017   Mixed stress and urge urinary incontinence 11/12/2016   Degenerative arthritis of lumbar spine 11/12/2016   Common migraine with intractable migraine 09/20/2016   Iron deficiency anemia 09/12/2016   GAD (generalized anxiety disorder) 12/20/2015   OSA on CPAP 12/20/2015   Benign neoplasm of sigmoid colon    Chronic pain of multiple joints 06/14/2014   Arthritis, degenerative 06/14/2014   Morbid obesity with BMI of 40.0-44.9, adult (HCC) 06/14/2014   Multinodular goiter  08/18/2013   Hypertension goal BP (blood pressure) < 140/90 12/07/2010   Familial multiple lipoprotein-type hyperlipidemia 12/07/2010   Allergic rhinitis 10/13/2008    Past Surgical History:  Procedure Laterality Date   ABDOMINAL HYSTERECTOMY  1996   BIOPSY  08/07/2023    Procedure: BIOPSY;  Surgeon: Selena Daily, MD;  Location: [facility];  Service: Gastroenterology;;   BLADDER SUSPENSION  1997   CESAREAN SECTION  1995   CHOLECYSTECTOMY     COLONOSCOPY WITH PROPOFOL  N/A 05/29/2015   Procedure: COLONOSCOPY WITH PROPOFOL ;  Surgeon: Marnee Sink, MD;  Location: [facility];  Service: Endoscopy;  Laterality: N/A;  Latex allergy sleep apnea - no CPAP machine (yet)   COLONOSCOPY WITH PROPOFOL  N/A 12/18/2021   Procedure: COLONOSCOPY WITH PROPOFOL ;  Surgeon: Selena Daily, MD;  Location: [facility];  Service: Gastroenterology;  Laterality: N/A;   COLONOSCOPY WITH PROPOFOL  N/A 08/07/2023   Procedure: COLONOSCOPY WITH PROPOFOL ;  Surgeon: Selena Daily, MD;  Location: [facility];  Service: Gastroenterology;  Laterality: N/A;   ECTOPIC PREGNANCY SURGERY     ESOPHAGOGASTRODUODENOSCOPY N/A 04/23/2017   Procedure: ESOPHAGOGASTRODUODENOSCOPY (EGD);  Surgeon: Selena Daily, MD;  Location: [facility];  Service: Gastroenterology;  Laterality: N/A;  Latex allergy sleep apnea   ESOPHAGOGASTRODUODENOSCOPY (EGD) WITH PROPOFOL  N/A 08/07/2023   Procedure: ESOPHAGOGASTRODUODENOSCOPY (EGD) WITH PROPOFOL ;  Surgeon: Selena Daily, MD;  Location: [facility];  Service: Gastroenterology;  Laterality: N/A;   POLYPECTOMY  05/29/2015   Procedure: POLYPECTOMY;  Surgeon: Marnee Sink, MD;  Location: [facility];  Service: Endoscopy;;   POLYPECTOMY  08/07/2023   Procedure: POLYPECTOMY;  Surgeon: Selena Daily, MD;  Location: [facility];  Service: Gastroenterology;;    Family History  Problem Relation Age of Onset   Diabetes Sister    Anxiety disorder Sister    Depression Sister    Hypertension Sister    Cirrhosis Mother    Diabetes Mother    Cirrhosis Father    Breast cancer Sister 34   Heart attack Brother    Alcohol abuse Brother    Drug abuse Brother    Prostate cancer Neg Hx    Kidney cancer Neg Hx     Bladder Cancer Neg Hx     Social History   Tobacco Use   Smoking status: Never    Passive exposure: Never   Smokeless tobacco: Never  Substance Use Topics   Alcohol use: No    Alcohol/week: 0.0 standard drinks of alcohol     Current Outpatient Medications:    acetaminophen  (TYLENOL ) 500 MG tablet, Take 2 tablets (1,000 mg total) by mouth every 6 (six) hours as needed for mild pain (pain score 1-3)., Disp: , Rfl:    amLODipine  (NORVASC ) 2.5 MG tablet, Take 1 tablet (2.5 mg total) by mouth daily., Disp: 90 tablet, Rfl: 1   aspirin  81 MG EC tablet, Take 81 mg by mouth daily., Disp: , Rfl:    Cholecalciferol (VITAMIN D ) 50 MCG (2000 UT) CAPS, Take 2,000 Units by mouth daily., Disp: , Rfl:    cloNIDine  (CATAPRES ) 0.1 MG tablet, TAKE 1 TABLET BY MOUTH AT BEDTIME, Disp: 90 tablet, Rfl: 0   dimenhyDRINATE (DRAMAMINE) 50 MG tablet, Take 50 mg by mouth every 8 (eight) hours as needed for nausea., Disp: , Rfl:    fluticasone  (FLONASE ) 50 MCG/ACT nasal spray, Place 2 sprays into both nostrils daily as needed for allergies., Disp: 48 mL, Rfl: 0   Fluticasone  Furoate (ARNUITY ELLIPTA ) 100 MCG/ACT AEPB, Take  1 puff by mouth daily at 12 noon., Disp: 30 each, Rfl: 5   gabapentin  (NEURONTIN ) 400 MG capsule, Take 1 capsule in the morning, take 2 at night, Disp: 270 capsule, Rfl: 1   Galcanezumab -gnlm (EMGALITY ) 120 MG/ML SOAJ, Inject 120 mg into the skin every 30 (thirty) days., Disp: 1.12 mL, Rfl: 11   glucose blood test strip, Use as instructed, Disp: 100 each, Rfl: 12   ibuprofen  (ADVIL ) 600 MG tablet, Take 1 tablet (600 mg total) by mouth every 8 (eight) hours as needed for moderate pain (pain score 4-6)., Disp: 60 tablet, Rfl: 1   Iron-FA-B Cmp-C-Biot-Probiotic (FUSION PLUS) CAPS, Take 1 capsule by mouth daily., Disp: , Rfl:    Lancet Devices (ONE TOUCH DELICA LANCING DEV) MISC, 1 each by Does not apply route daily., Disp: 1 each, Rfl: 0   levothyroxine  (SYNTHROID ) 100 MCG tablet, Take 100 mcg by  mouth daily before breakfast., Disp: , Rfl:    linaclotide  (LINZESS ) 145 MCG CAPS capsule, Take 1 capsule (145 mcg total) by mouth daily., Disp: 90 capsule, Rfl: 1   loratadine (CLARITIN) 10 MG tablet, Take 10 mg by mouth daily., Disp: , Rfl:    metFORMIN  (GLUCOPHAGE -XR) 750 MG 24 hr tablet, Take 2 tablets (1,500 mg total) by mouth daily with breakfast., Disp: 180 tablet, Rfl: 1   metoCLOPramide  (REGLAN ) 5 MG tablet, Take 1 tablet (5 mg total) by mouth daily before supper. (Patient taking differently: Take 5 mg by mouth daily as needed for nausea or vomiting. Takes before supper if needed.), Disp: 90 tablet, Rfl: 1   metoprolol  succinate (TOPROL -XL) 50 MG 24 hr tablet, TAKE 1 TABLET BY MOUTH ONCE A DAY WITH OR IMMEDIATELY FOLLOWING A MEAL., Disp: 90 tablet, Rfl: 1   mirtazapine  (REMERON ) 15 MG tablet, Take 1 tablet (15 mg total) by mouth at bedtime., Disp: 30 tablet, Rfl: 1   montelukast  (SINGULAIR ) 10 MG tablet, TAKE 1 TABLET BY MOUTH AT BEDTIME, Disp: 90 tablet, Rfl: 0   nitroGLYCERIN  (NITROSTAT ) 0.4 MG SL tablet, Place 1 tablet (0.4 mg total) under the tongue every 5 (five) minutes as needed., Disp: 25 tablet, Rfl: 0   olmesartan -hydrochlorothiazide  (BENICAR  HCT) 40-25 MG tablet, Take 1 tablet by mouth daily., Disp: 90 tablet, Rfl: 1   omeprazole  (PRILOSEC) 40 MG capsule, Take 1 capsule by mouth once daily, Disp: 90 capsule, Rfl: 3   ondansetron  (ZOFRAN ) 4 MG tablet, TAKE 1 TABLET BY MOUTH EVERY 8 HOURS AS NEEDED FOR NAUSEA FOR VOMITING, Disp: 30 tablet, Rfl: 0   OneTouch Delica Lancets 33G MISC, 1 each by Does not apply route daily., Disp: 100 each, Rfl: 2   pioglitazone  (ACTOS ) 15 MG tablet, Take 1 tablet (15 mg total) by mouth daily., Disp: 90 tablet, Rfl: 1   rizatriptan  (MAXALT ) 10 MG tablet, May repeat in 2 hours if needed (Patient taking differently: Take 10 mg by mouth as needed for migraine. May repeat in 2 hours if needed), Disp: 10 tablet, Rfl: 11   rosuvastatin  (CRESTOR ) 40 MG tablet,  TAKE 1 TABLET BY MOUTH ONCE DAILY. IN PLACE OF ATORVASTATIN  (Patient taking differently: Take 40 mg by mouth at bedtime.), Disp: 90 tablet, Rfl: 1   VENTOLIN  HFA 108 (90 Base) MCG/ACT inhaler, INHALE 2 PUFFS BY MOUTH EVERY 6 HOURS AS NEEDED FOR WHEEZING OR SHORTNESS OF BREATH, Disp: 36 g, Rfl: 0  Allergies  Allergen Reactions   Latex Swelling    Pt unsure of reaction pt states something happened while in the hospital after  surgery.    I personally reviewed active problem list, medication list, allergies, family history with the patient/caregiver today.   ROS  Ten systems reviewed and is negative except as mentioned in HPI    Objective Physical Exam  Constitutional: Patient appears well-developed and well-nourished. Obese  No distress.  HEENT: head atraumatic, normocephalic, pupils equal and reactive to light, neck supple Cardiovascular: Normal rate, regular rhythm and normal heart sounds.  No murmur heard. No BLE edema. Pulmonary/Chest: Effort normal and breath sounds normal. No respiratory distress. Abdominal: Soft.  There is no tenderness. Muscular skeletal: uses a cane Psychiatric: Patient has a normal mood and affect. behavior is normal. Judgment and thought content normal.   Vitals:   11/20/23 1043  BP: 126/80  Pulse: 91  Resp: 16  SpO2: 95%  Weight: 218 lb 14.4 oz (99.3 kg)  Height: 5\' 1"  (1.549 m)    Body mass index is 41.36 kg/m.  Recent Results (from the past 2160 hours)  Cervicovaginal ancillary only     Status: Abnormal   Collection Time: 10/02/23 11:21 AM  Result Value Ref Range   Bacterial Vaginitis (gardnerella) Negative    Candida Vaginitis Positive (A)    Candida Glabrata Negative    Comment      Normal Reference Range Bacterial Vaginosis - Negative   Comment Normal Reference Range Candida Species - Negative    Comment Normal Reference Range Candida Galbrata - Negative      PHQ2/9:    11/20/2023   10:42 AM 08/20/2023    3:32 PM 07/18/2023     9:32 AM 04/30/2023   12:06 PM 04/04/2023   10:09 AM  Depression screen PHQ 2/9  Decreased Interest 2 2 1  2   Down, Depressed, Hopeless 2 2 1  2   PHQ - 2 Score 4 4 2  4   Altered sleeping 1 2 1  2   Tired, decreased energy 1 2 1  2   Change in appetite 1 2 1  2   Feeling bad or failure about yourself  1 0 1  1  Trouble concentrating 1 0 1  0  Moving slowly or fidgety/restless 0 0 0  0  Suicidal thoughts 0 0 0  0  PHQ-9 Score 9 10 7  11   Difficult doing work/chores Very difficult Somewhat difficult Very difficult  Somewhat difficult     Information is confidential and restricted. Go to Review Flowsheets to unlock data.    phq 9 is positive  Fall Risk:    11/20/2023   10:44 AM 09/08/2023    3:10 PM 08/20/2023    3:32 PM 07/18/2023    9:24 AM 04/04/2023   10:09 AM  Fall Risk   Falls in the past year? 1 1 1  0 0  Number falls in past yr: 0 0 0 0   Injury with Fall? 0 0 0 0   Risk for fall due to : Impaired balance/gait Impaired balance/gait Impaired balance/gait No Fall Risks No Fall Risks  Follow up Education provided;Falls evaluation completed;Falls prevention discussed Falls prevention discussed Falls prevention discussed;Education provided;Falls evaluation completed Falls prevention discussed;Education provided;Falls evaluation completed Falls prevention discussed      Assessment & Plan Type 2 diabetes mellitus Well-controlled with A1c of 6%. - Continue pioglitazone  15 mg daily. - Continue metformin  750 mg twice daily. - Monitor blood glucose levels at home. - Follow up with endocrinologist in June for A1c check.  Diabetic neuropathy Present with decreased sensation on foot exam during visit with Dr.  O'connell. Takes gabapentin  for back pain already .  Hypertension Well-controlled with multiple antihypertensives. Exertional dyspnea noted. - Continue amlodipine  2.5 mg daily. - Continue clonidine  0.1 mg at night. - Continue metoprolol  50 mg daily. - Continue olmesartan  HCTZ  40/25 mg daily. - Continue aspirin  therapy.  Obstructive sleep apnea Managed with CPAP therapy. Compliance reported, alleviating headaches and snoring. - Request copy of sleep study from [facility]. - Document CPAP compliance to facilitate supply orders. - Assist in finding a new ENT provider if needed.  Hyperlipidemia Well-managed with rosuvastatin . LDL at 57 mg/dL. - Continue rosuvastatin  40 mg daily.  Morbid obesity BMI over 41. Weight loss from 224 lbs to 218.9 lbs. - Encourage continued weight loss efforts.  Hypothyroidism Well-managed with levothyroxine . TSH levels normal. - Continue levothyroxine  100 mcg daily.  Major depressive disorder, recurrent, mild  Managed with mirtazapine . Symptom improvement reported. - Continue mirtazapine  15 mg at night.  Moderate persistent asthma Managed with Arnuity. Reduced coughing and wheezing reported. - Continue Arnuity as prescribed.  Migraine Managed with Emgality  and Maxalt . Increased migraines due to stress. - Continue Emgality  injections monthly. - Use Maxalt  as needed for acute migraine episodes.  Gastroesophageal reflux disease (GERD) Managed with omeprazole . No current heartburn issues. - Continue omeprazole  as prescribed.  Chronic idiopathic constipation Managed with Linzess . Effective. - Continue Linzess  as prescribed.  General Health Maintenance Normal iron levels and negative protein in urine. - Ensure vitamin D  supplementation. - Monitor for anemia.  Follow-up - Schedule follow-up in 6 months with primary care. - Follow up with endocrinologist in June.

## 2023-12-03 ENCOUNTER — Other Ambulatory Visit: Payer: Self-pay | Admitting: Physician Assistant

## 2023-12-03 ENCOUNTER — Other Ambulatory Visit: Payer: Self-pay | Admitting: Pharmacist

## 2023-12-03 DIAGNOSIS — I10 Essential (primary) hypertension: Secondary | ICD-10-CM

## 2023-12-03 DIAGNOSIS — E119 Type 2 diabetes mellitus without complications: Secondary | ICD-10-CM

## 2023-12-03 NOTE — Patient Instructions (Signed)
 Goals Addressed             This Visit's Progress    Pharmacy Goals       Please be sure to use your Arnuity Ellipta inhaler everyday to control your symptoms. Please remember to rinse your mouth thoroughly with water and spit out after each use.   Check your blood pressure twice weekly, and any time you have concerning symptoms like headache, chest pain, dizziness, shortness of breath, or vision changes.   Our goal is less than 130/80.  To appropriately check your blood pressure, make sure you do the following:  1) Avoid caffeine, exercise, or tobacco products for 30 minutes before checking. Empty your bladder. 2) Sit with your back supported in a flat-backed chair. Rest your arm on something flat (arm of the chair, table, etc). 3) Sit still with your feet flat on the floor, resting, for at least 5 minutes.  4) Check your blood pressure. Take 1-2 readings.  5) Write down these readings and bring with you to any provider appointments.  Bring your home blood pressure machine with you to a provider's office for accuracy comparison at least once a year.   Make sure you take your blood pressure medications before you come to any office visit, even if you were asked to fast for labs.   Please let me know if your have any further medication questions or concerns!   Estelle Grumbles, PharmD, [facility] [phone]

## 2023-12-03 NOTE — Progress Notes (Signed)
 12/03/2023 Name: Beverly Barrett MRN: [id number] DOB: March 28, 1961  Chief Complaint  Patient presents with   Medication Management    Beverly Barrett is a 63 y.o. year old female who presented for a telephone visit.   They were referred to the pharmacist by their PCP for assistance in managing medication access and medication adherence.    Subjective:   Care Team: Primary Care Provider: Sowles, Krichna, MD; Next Scheduled Visit: 05/21/2024 GI Specialist: Beverly Daily, MD Neurologist: Wess Hammed, NP; Next Scheduled Visit: 02/10/2024 Endocrinologist: Tyron Gallon, MD; Next Scheduled Visit: 01/22/2024 Psychiatrist: Eappen, Saramma, MD; Next Scheduled Visit: 01/21/2024  Medication Access/Adherence  Current Pharmacy:  [facility] [address] ([address]), [place] - [address] [place] ([address]) [place] [address] Phone: [phone] Fax: [phone]  [facility] - [place] - [address] [place] [address] Phone: [phone] Fax: [phone]   Patient reports affordability concerns with their medications: No  Patient reports access/transportation concerns to their pharmacy: No  Patient reports adherence concerns with their medications:  No     Uses weekly pillbox with support of daughter/husband. Reports adherence improved with placing pillbox in a more central location where she will see it  Reports nausea has improved some with eating smaller meals spread throughout the day, but still having some nausea on and off.     Asthma:   Current medications:  - montelukast  10 mg nightly - Arnuity Ellipta  100 mcg/act - 1 puff Barrett             Confirms rinsing mouth and spitting out after each use - Albuterol  HFA - 2 puffs every 6 hours as needed for wheezing or shortness of breath   Medications tried in the past: Flovent  HFA (cost); Qvar (did not work well for  patient)   Reports rarely needing albuterol  inhaler as breathing well controlled     Hypertension:   Current medications:  - amlodipine  2.5 mg Barrett - clonidine  0.1 mg nightly at bedtime - metoprolol  ER 50 mg Barrett - olmesartan -HCTZ 40-25 mg Barrett   Reports has been monitoring her home BP and reading today:123/87  Denies adding salt to her food    Diabetes:   Current medications:  - metformin  ER 750 mg -2 tablets Barrett with breakfast - pioglitazone  15 mg Barrett   Current glucose readings: last checked this morning, reading: 98   Patient denies hypoglycemic s/sx including dizziness, shakiness, sweating.    Objective:  Lab Results  Component Value Date   HGBA1C 6.0 (A) 07/18/2023    Lab Results  Component Value Date   CREATININE 0.70 08/12/2023   BUN 8 08/12/2023   NA 142 08/12/2023   K 3.6 08/12/2023   CL 105 08/12/2023   CO2 25 08/08/2023    Lab Results  Component Value Date   CHOL 136 04/04/2023   HDL 57 04/04/2023   LDLCALC 57 04/04/2023   TRIG 136 04/04/2023   CHOLHDL 2.4 04/04/2023   BP Readings from Last 3 Encounters:  11/20/23 126/80  10/29/23 127/81  10/20/23 130/85   Pulse Readings from Last 3 Encounters:  11/20/23 91  10/29/23 72  10/20/23 74    Medications Reviewed Today     Reviewed by Ardis Becton, RPH-CPP (Pharmacist) on 12/03/23 at 1320  Med List Status: <None>   Medication Order Taking? Sig Documenting Provider Last Dose Status Informant  acetaminophen  (TYLENOL ) 500 MG tablet [id number]  Take 2 tablets (1,000 mg total) by mouth every 6 (six) hours as needed for mild pain (pain score 1-3). Emmalene Hare, MD  Active   amLODipine  (NORVASC ) 2.5 MG tablet [id number] Yes Take 1 tablet (2.5 mg total) by mouth Barrett. Sowles, Krichna, MD Taking Active   aspirin  81 MG EC tablet [id number]  Take 81 mg by mouth Barrett. [provider]  Active Self, Pharmacy Records  Cholecalciferol (VITAMIN D ) 50 MCG (2000 UT) CAPS [id number]  Take  2,000 Units by mouth Barrett. [provider]  Active Self, Pharmacy Records  cloNIDine  (CATAPRES ) 0.1 MG tablet [id number] Yes Take 1 tablet (0.1 mg total) by mouth at bedtime. Sowles, Krichna, MD Taking Active   dimenhyDRINATE (DRAMAMINE) 50 MG tablet [id number]  Take 50 mg by mouth every 8 (eight) hours as needed for nausea. [provider]  Active Self, Pharmacy Records  fluticasone  (FLONASE ) 50 MCG/ACT nasal spray [id number]  Place 2 sprays into both nostrils Barrett as needed for allergies. Sowles, Krichna, MD  Active Self, Pharmacy Records  Fluticasone  Furoate (ARNUITY ELLIPTA ) 100 MCG/ACT AEPB [id number] Yes Take 1 puff by mouth Barrett at 12 noon. Sowles, Krichna, MD Taking Active   gabapentin  (NEURONTIN ) 400 MG capsule [id number]  Take 1 capsule in the morning, take 2 at night Wess Hammed, NP  Active Self, Pharmacy Records  Galcanezumab -gnlm (EMGALITY ) 120 MG/ML SOAJ [id number]  Inject 120 mg into the skin every 30 (thirty) days. Wess Hammed, NP  Active Self, Pharmacy Records  glucose blood test strip [id number]  Use as instructed Sowles, Krichna, MD  Active Self, Pharmacy Records  Iron-FA-B Cmp-C-Biot-Probiotic (FUSION PLUS) CAPS [id number]  Take 1 capsule by mouth Barrett. [provider]  Active   Lancet Devices (ONE TOUCH DELICA LANCING DEV) MISC [id number]  1 each by Does not apply route Barrett. Sowles, Krichna, MD  Active Self, Pharmacy Records  levothyroxine  (SYNTHROID ) 100 MCG tablet [id number] Yes Take 100 mcg by mouth Barrett before breakfast. [provider] Taking Active Self, Pharmacy Records  linaclotide  (LINZESS ) 145 MCG CAPS capsule [id number]  Take 1 capsule (145 mcg total) by mouth Barrett. Sowles, Krichna, MD  Active   loratadine (CLARITIN) 10 MG tablet [id number]  Take 10 mg by mouth Barrett. [provider]  Active Self, Pharmacy Records  metFORMIN  (GLUCOPHAGE -XR) 750 MG 24 hr tablet [id number] Yes Take 2 tablets (1,500 mg total) by mouth Barrett with  breakfast. Sowles, Krichna, MD Taking Active   metoCLOPramide  (REGLAN ) 5 MG tablet [id number] Yes Take 1 tablet (5 mg total) by mouth Barrett before supper.  Patient taking differently: Take 5 mg by mouth Barrett as needed for nausea or vomiting. Takes before supper if needed.   Sowles, Krichna, MD Taking Active Self, Pharmacy Records           Med Note [place], Chauvin R   Tue Aug 05, 2023 10:07 AM)    metoprolol  succinate (TOPROL -XL) 50 MG 24 hr tablet [id number] Yes TAKE 1 TABLET BY MOUTH ONCE A DAY WITH OR IMMEDIATELY FOLLOWING A MEAL. Sowles, Krichna, MD Taking Active Self, Pharmacy Records  mirtazapine  (REMERON ) 15 MG tablet [id number]  Take 1 tablet (15 mg total) by mouth at bedtime. Eappen, Saramma, MD  Active   montelukast  (SINGULAIR ) 10 MG tablet [id number]  Take 1 tablet (10 mg total) by mouth at bedtime. Sowles, Krichna, MD  Active   nitroGLYCERIN  (NITROSTAT ) 0.4 MG SL tablet [id number]  Place 1 tablet (0.4 mg total) under the tongue every 5 (five) minutes as needed.  Sowles, Krichna, MD  Active Self, Pharmacy Records  olmesartan -hydrochlorothiazide  (BENICAR  HCT) 40-25 MG tablet [id number] Yes Take 1 tablet by mouth Barrett. Sowles, Krichna, MD Taking Active   omeprazole  (PRILOSEC) 40 MG capsule [id number] Yes Take 1 capsule by mouth once Barrett Vanga, Rohini Reddy, MD Taking Active   ondansetron  (ZOFRAN ) 4 MG tablet [id number]  TAKE 1 TABLET BY MOUTH EVERY 8 HOURS AS NEEDED FOR NAUSEA FOR VOMITING Brigitte Canard, PA-C  Active   OneTouch Delica Lancets 33G MISC [id number]  1 each by Does not apply route Barrett. Sowles, Krichna, MD  Active Self, Pharmacy Records  pioglitazone  (ACTOS ) 15 MG tablet [id number] Yes Take 1 tablet (15 mg total) by mouth Barrett. Sowles, Krichna, MD Taking Active Self, Pharmacy Records  rizatriptan  (MAXALT ) 10 MG tablet [id number]  May repeat in 2 hours if needed  Patient taking differently: Take 10 mg by mouth as needed for migraine. May repeat in 2 hours if needed   Wess Hammed,  NP  Active Self, Pharmacy Records  rosuvastatin  (CRESTOR ) 40 MG tablet [id number]  Take 1 tablet (40 mg total) by mouth at bedtime. Sowles, Krichna, MD  Active   VENTOLIN  HFA 108 [phone] Base) MCG/ACT inhaler [id number]  INHALE 2 PUFFS BY MOUTH EVERY 6 HOURS AS NEEDED FOR WHEEZING OR SHORTNESS OF BREATH Sowles, Krichna, MD  Active Self, Pharmacy Records  Med List Note Reatha Campanile, RN 09/05/21 1150): bruin              Assessment/Plan:   Encourage patient to contact [facility] specialist to discuss her ongoing nausea. Patient plans to also discuss with provider where to follow up for her next appointment as she received a message that Dr. Baldomero Bone is leaving [facility]  Asthma: - Reviewed appropriate inhaler technique. Encourage patient to continue to use maintenance inhaler consistently     Hypertension: - Reviewed appropriate blood pressure monitoring technique and reviewed goal blood pressure. Recommended to check home blood pressure and heart rate, keep log of results and have this record to review at upcoming medical appointments. Patient to contact provider office sooner if needed for readings outside of established parameters or symptoms   Diabetes: - Currently controlled - Recommend to check glucose, keep log of results and have this record to review at upcoming medical appointments. Patient to contact provider office sooner if needed for readings outside of established parameters or symptoms - Encourage patient to schedule annual eye exam     Follow Up Plan: Clinical Pharmacist will follow up with patient by telephone on 03/10/2024 at 1:00 PM      Arthur Lash, PharmD, [facility] [phone]

## 2023-12-30 ENCOUNTER — Other Ambulatory Visit (HOSPITAL_COMMUNITY): Payer: Self-pay

## 2023-12-30 ENCOUNTER — Telehealth: Payer: Self-pay

## 2023-12-30 NOTE — Telephone Encounter (Signed)
 Pharmacy Patient Advocate Encounter   Received notification from CoverMyMeds that prior authorization for Emgality  120MG /ML auto-injectors (migraine) is required/requested.   Insurance verification completed.   The patient is insured through [employer] .   Per test claim: PA required; PA submitted to above mentioned insurance via CoverMyMeds Key/confirmation #/EOC [id number] Status is pending

## 2023-12-30 NOTE — Telephone Encounter (Signed)
 Pharmacy Patient Advocate Encounter  Received notification from [employer] that Prior Authorization for Emgality  120MG /ML auto-injectors (migraine) has been APPROVED from 12/30/2023 to 12/29/2024. Ran test claim, Copay is $0. This test claim was processed through [facility]- copay amounts may vary at other pharmacies due to pharmacy/plan contracts, or as the patient moves through the different stages of their insurance plan.   PA #/Case ID/Reference #: [id number]

## 2024-01-09 ENCOUNTER — Other Ambulatory Visit: Payer: Self-pay | Admitting: Psychiatry

## 2024-01-09 ENCOUNTER — Other Ambulatory Visit: Payer: Self-pay | Admitting: Family Medicine

## 2024-01-09 DIAGNOSIS — G4701 Insomnia due to medical condition: Secondary | ICD-10-CM

## 2024-01-09 DIAGNOSIS — F411 Generalized anxiety disorder: Secondary | ICD-10-CM

## 2024-01-09 DIAGNOSIS — E1159 Type 2 diabetes mellitus with other circulatory complications: Secondary | ICD-10-CM

## 2024-01-12 ENCOUNTER — Ambulatory Visit: Admitting: Psychiatry

## 2024-01-15 ENCOUNTER — Telehealth: Payer: Self-pay | Admitting: Family Medicine

## 2024-01-15 NOTE — Telephone Encounter (Signed)
 amLODipine  (NORVASC ) 2.5 MG tablet    Requesting 90 day supply

## 2024-01-15 NOTE — Telephone Encounter (Signed)
 Pt has enough till October 2025

## 2024-01-20 ENCOUNTER — Other Ambulatory Visit: Payer: Self-pay

## 2024-01-20 ENCOUNTER — Other Ambulatory Visit: Payer: Self-pay | Admitting: Family Medicine

## 2024-01-20 ENCOUNTER — Telehealth: Payer: Self-pay | Admitting: Family Medicine

## 2024-01-20 DIAGNOSIS — E1169 Type 2 diabetes mellitus with other specified complication: Secondary | ICD-10-CM

## 2024-01-20 DIAGNOSIS — J4540 Moderate persistent asthma, uncomplicated: Secondary | ICD-10-CM

## 2024-01-20 DIAGNOSIS — E1159 Type 2 diabetes mellitus with other circulatory complications: Secondary | ICD-10-CM

## 2024-01-20 MED ORDER — PIOGLITAZONE HCL 15 MG PO TABS: 15.0000 mg | ORAL_TABLET | Freq: Every day | ORAL | 1 refills | Status: DC

## 2024-01-20 MED ORDER — GABAPENTIN 400 MG PO CAPS: ORAL_CAPSULE | ORAL | 1 refills | Status: AC

## 2024-01-20 NOTE — Telephone Encounter (Signed)
cloNIDine (CATAPRES) 0.1 MG tablet ° °

## 2024-01-20 NOTE — Telephone Encounter (Signed)
pioglitazone (ACTOS) 15 MG tablet 

## 2024-01-20 NOTE — Telephone Encounter (Signed)
montelukast (SINGULAIR) 10 MG tablet

## 2024-01-20 NOTE — Telephone Encounter (Signed)
nitroGLYCERIN (NITROSTAT) 0.4 MG SL tablet

## 2024-01-20 NOTE — Telephone Encounter (Signed)
 Only filled ACTOS , other medication have enough supply

## 2024-01-20 NOTE — Telephone Encounter (Signed)
rosuvastatin (CRESTOR) 40 MG tablet

## 2024-01-21 ENCOUNTER — Encounter: Payer: Self-pay | Admitting: Psychiatry

## 2024-01-21 ENCOUNTER — Ambulatory Visit (INDEPENDENT_AMBULATORY_CARE_PROVIDER_SITE_OTHER): Admitting: Psychiatry

## 2024-01-21 VITALS — BP 124/80 | HR 88 | Temp 98.4°F | Ht 61.0 in | Wt 215.0 lb

## 2024-01-21 DIAGNOSIS — F330 Major depressive disorder, recurrent, mild: Secondary | ICD-10-CM | POA: Diagnosis not present

## 2024-01-21 DIAGNOSIS — G4701 Insomnia due to medical condition: Secondary | ICD-10-CM

## 2024-01-21 DIAGNOSIS — F411 Generalized anxiety disorder: Secondary | ICD-10-CM

## 2024-01-21 MED ORDER — SERTRALINE HCL 25 MG PO TABS: 25.0000 mg | ORAL_TABLET | Freq: Every day | ORAL | 1 refills | Status: DC

## 2024-01-21 NOTE — Progress Notes (Signed)
 BH MD OP Progress Note  01/21/2024 12:37 PM Beverly Barrett  MRN:  [id number]  Chief Complaint:  Chief Complaint  Patient presents with   Follow-up   Anxiety   Depression   Medication Refill   Discussed the use of AI scribe software for clinical note transcription with the patient, who gave verbal consent to proceed.  History of Present Illness Beverly Barrett is a 63 year old African American female, married, lives in [place], has a history of MDD, GAD, chronic pain, arthritis, history of always uses CPAP, hypothyroidism, migraine headaches was evaluated in office today.  She experiences significant anxiety and sleep disturbances due to her caregiving responsibilities for her nearly four-year-old granddaughter, who has been suggested to have possible autism. The child is undergoing therapy and has shown some progress, such as using hand signs to communicate, but she does not speak much and often screams and refuses to eat. The granddaughter has difficulty sleeping, sometimes staying awake until 5 AM, which impacts her own sleep.  She is responsible for her granddaughter's care at night because her daughter works the night shift as a Environmental consultant at [employer]. This situation contributes to her anxiety and sleep issues, as she feels the need to be alert to care for the child. Her granddaughter seems to miss her mother at night, which may contribute to her sleep disturbances.  She had struggles with sadness, low energy, anxiety symptoms on a regular basis.  She is currently taking mirtazapine  at bedtime but reports it is not effectively helping her sleep due to her caregiving responsibilities.   In addition to her granddaughter's situation, she is also dealing with family stressors, including her sister's recent breast cancer diagnosis and surgery, and her grandson's need for therapy due to self-harming behavior. These family issues add to her overall stress and anxiety.  Denies  suicidality, homicidality or perceptual disturbances.    Visit Diagnosis:    ICD-10-CM   1. MDD (major depressive disorder), recurrent episode, mild (HCC)  F33.0 sertraline (ZOLOFT) 25 MG tablet    2. GAD (generalized anxiety disorder)  F41.1 sertraline (ZOLOFT) 25 MG tablet    3. Insomnia due to medical condition  G47.01    Mood, insomnia, OSA on CPAP      Past Psychiatric History: I have reviewed past psychiatric history from progress note on 01/21/2018.  Past Medical History:  Past Medical History:  Diagnosis Date   Anginal pain (HCC)    none in approx 2 yrs   Anxiety    Asthma    Benign neoplasm of sigmoid colon    Chronic constipation    Chronic lower back pain    Common migraine with intractable migraine 09/20/2016   Depression    suicidal ideation   GERD (gastroesophageal reflux disease)    Headache    migraines -3x/wk   Hyperlipidemia    Hypertension    Hypothyroidism    Iron deficiency anemia    Morbid obesity with BMI of 40.0-44.9, adult (HCC)    Motion sickness    cars   Multilevel degenerative disc disease    Multinodular goiter    Shortness of breath dyspnea    Sleep apnea    CPAP   Type 2 diabetes mellitus with diabetic polyneuropathy, without long-term current use of insulin  (HCC)    Vertigo    Vitamin D  deficiency    Wears dentures    partial upper    Past Surgical History:  Procedure Laterality Date   ABDOMINAL HYSTERECTOMY  1996   BIOPSY  08/07/2023   Procedure: BIOPSY;  Surgeon: Unk Corinn Skiff, MD;  Location: [facility] ENDOSCOPY;  Service: Gastroenterology;;   BLADDER SUSPENSION  1997   CESAREAN SECTION  1995   CHOLECYSTECTOMY     COLONOSCOPY WITH PROPOFOL  N/A 05/29/2015   Procedure: COLONOSCOPY WITH PROPOFOL ;  Surgeon: Rogelia Copping, MD;  Location: [facility];  Service: Endoscopy;  Laterality: N/A;  Latex allergy sleep apnea - no CPAP machine (yet)   COLONOSCOPY WITH PROPOFOL  N/A 12/18/2021   Procedure: COLONOSCOPY WITH  PROPOFOL ;  Surgeon: Unk Corinn Skiff, MD;  Location: [facility] END[facility]COPY;  Service: Gastroenterology;  Laterality: N/A;   COLONOSCOPY WITH PROPOFOL  N/A 08/07/2023   Procedure: COLONOSCOPY WITH PROPOFOL ;  Surgeon: Unk Corinn Skiff, MD;  Location[facility];  Service: Gastroenterology;  Laterality: N/A;   ECTOPIC PREGNANCY SURGERY     ESOPHAGOGASTRODUODENOSCOPY N/A 04/23/2017   Procedure: ESOPHAGOGASTRODUODENOSCOPY (EGD);  Surgeon: Unk Corinn Skiff, MD;  Location: [facility];  Service: Gastroenterology;  Laterality: N/A;  Latex allergy sleep apnea   ESOPHAGOGASTRODUODENOSCOPY (EGD) WITH PROPOFOL  N/A 08/07/2023   Procedure: ESOPHAGOGASTRODUODENOSCOPY (EGD) WITH PROPOFOL ;  Surgeon: Unk Corinn Skiff, MD;  Location: [facility] ENDOSCOPY;  Service: Gastroenterology;  Laterality: N/A;   POLYPECTOMY  05/29/2015   Procedure: POLYPECTOMY;  Surgeon: Rogelia Copping, MD;  Location: [facility];  Service: Endoscopy;;   POLYPECTOMY  08/07/2023   Procedure: POLYPECTOMY;  Surgeon: Unk Corinn Skiff, MD;  Location: [facility] ENDOSCOPY;  Service: Gastroenterology;;    Family Psychiatric History: I have reviewed family psychiatric history from progress note on 01/21/2018.  Family History:  Family History  Problem Relation Age of Onset   Diabetes Sister    Anxiety disorder Sister    Depression Sister    Hypertension Sister    Cirrhosis Mother    Diabetes Mother    Cirrhosis Father    Breast cancer Sister 70   Heart attack Brother    Alcohol abuse Brother    Drug abuse Brother    Prostate cancer Neg Hx    Kidney cancer Neg Hx    Bladder Cancer Neg Hx     Social History: I have reviewed social history from progress note on 01/21/2018. Social History   Socioeconomic History   Marital status: Married    Spouse name: Training and development officer   Number of children: 3   Years of education: Not on file   Highest education level: GED or equivalent  Occupational History   Not on file  Tobacco Use    Smoking status: Never    Passive exposure: Never   Smokeless tobacco: Never  Vaping Use   Vaping status: Never Used  Substance and Sexual Activity   Alcohol use: No    Alcohol/week: 0.0 standard drinks of alcohol   Drug use: No   Sexual activity: Not Currently    Partners: Male  Other Topics Concern   Not on file  Social History Narrative   Lives with husband and one daughter    She does not work, husband on disability    Social Drivers of Corporate investment banker Strain: High Risk (03/06/2020)   Overall Financial Resource Strain (CARDIA)    Difficulty of Paying Living Expenses: Very hard  Food Insecurity: No Food Insecurity (09/08/2023)   Hunger Vital Sign    Worried About Running Out of Food in the Last Year: Never true    Ran Out of Food in the Last Year: Never true  Transportation Needs: No Transportation Needs (09/08/2023)  PRAPARE - Administrator, Civil Service (Medical): No    Lack of Transportation (Non-Medical): No  Physical Activity: Unknown (07/14/2023)   Exercise Vital Sign    Days of Exercise per Week: 2 days    Minutes of Exercise per Session: Not on file  Stress: Stress Concern Present (07/14/2019)   [employer] of Occupational Health - Occupational Stress Questionnaire    Feeling of Stress : To some extent  Social Connections: Moderately Integrated (07/14/2023)   Social Connection and Isolation Panel    Frequency of Communication with Friends and Family: Three times a week    Frequency of Social Gatherings with Friends and Family: Twice a week    Attends Religious Services: More than 4 times per year    Active Member of [employer] or Organizations: No    Attends Engineer, structural: Not on file    Marital Status: Married    Allergies:  Allergies  Allergen Reactions   Latex Swelling    Pt unsure of reaction pt states something happened while in the hospital after surgery.    Metabolic Disorder Labs: Lab Results  Component  Value Date   HGBA1C 6.0 (A) 07/18/2023   MPG 148 08/06/2021   MPG 162.81 08/28/2019   No results found for: PROLACTIN Lab Results  Component Value Date   CHOL 136 04/04/2023   TRIG 136 04/04/2023   HDL 57 04/04/2023   CHOLHDL 2.4 04/04/2023   VLDL 44 (H) 02/12/2017   LDLCALC 57 04/04/2023   LDLCALC 63 04/03/2022   Lab Results  Component Value Date   TSH 0.48 03/04/2022   TSH 0.01 (L) 12/03/2021    Therapeutic Level Labs: No results found for: LITHIUM No results found for: VALPROATE No results found for: CBMZ  Current Medications: Current Outpatient Medications  Medication Sig Dispense Refill   acetaminophen  (TYLENOL ) 500 MG tablet Take 2 tablets (1,000 mg total) by mouth every 6 (six) hours as needed for mild pain (pain score 1-3).     amLODipine  (NORVASC ) 2.5 MG tablet Take 1 tablet (2.5 mg total) by mouth daily. 90 tablet 1   aspirin  81 MG EC tablet Take 81 mg by mouth daily.     Cholecalciferol (VITAMIN D ) 50 MCG (2000 UT) CAPS Take 2,000 Units by mouth daily.     cloNIDine  (CATAPRES ) 0.1 MG tablet Take 1 tablet (0.1 mg total) by mouth at bedtime. 90 tablet 1   dimenhyDRINATE (DRAMAMINE) 50 MG tablet Take 50 mg by mouth every 8 (eight) hours as needed for nausea.     fluticasone  (FLONASE ) 50 MCG/ACT nasal spray Place 2 sprays into both nostrils daily as needed for allergies. 48 mL 0   Fluticasone  Furoate (ARNUITY ELLIPTA ) 100 MCG/ACT AEPB Take 1 puff by mouth daily at 12 noon. 30 each 5   gabapentin  (NEURONTIN ) 400 MG capsule Take 1 capsule in the morning, take 2 at night 270 capsule 1   Galcanezumab -gnlm (EMGALITY ) 120 MG/ML SOAJ Inject 120 mg into the skin every 30 (thirty) days. 1.12 mL 11   glucose blood test strip Use as instructed 100 each 12   Iron-FA-B Cmp-C-Biot-Probiotic (FUSION PLUS) CAPS Take 1 capsule by mouth once daily 30 capsule 0   Lancet Devices (ONE TOUCH DELICA LANCING DEV) MISC 1 each by Does not apply route daily. 1 each 0   levothyroxine   (SYNTHROID ) 100 MCG tablet Take 100 mcg by mouth daily before breakfast.     linaclotide  (LINZESS ) 145 MCG CAPS capsule Take 1  capsule (145 mcg total) by mouth daily. 90 capsule 1   loratadine (CLARITIN) 10 MG tablet Take 10 mg by mouth daily.     metFORMIN  (GLUCOPHAGE -XR) 750 MG 24 hr tablet Take 2 tablets (1,500 mg total) by mouth daily with breakfast. 180 tablet 1   metoCLOPramide  (REGLAN ) 5 MG tablet Take 1 tablet (5 mg total) by mouth daily before supper. (Patient taking differently: Take 5 mg by mouth daily as needed for nausea or vomiting. Takes before supper if needed.) 90 tablet 1   metoprolol  succinate (TOPROL -XL) 50 MG 24 hr tablet TAKE 1 TABLET BY MOUTH ONCE DAILY WITH OR IMMEDIATELY FOLLOWING A MEAL 90 tablet 0   mirtazapine  (REMERON ) 15 MG tablet TAKE 1 TABLET BY MOUTH AT BEDTIME 30 tablet 5   montelukast  (SINGULAIR ) 10 MG tablet Take 1 tablet (10 mg total) by mouth at bedtime. 90 tablet 1   nitroGLYCERIN  (NITROSTAT ) 0.4 MG SL tablet Place 1 tablet (0.4 mg total) under the tongue every 5 (five) minutes as needed. 25 tablet 0   olmesartan -hydrochlorothiazide  (BENICAR  HCT) 40-25 MG tablet Take 1 tablet by mouth daily. 90 tablet 1   omeprazole  (PRILOSEC) 40 MG capsule Take 1 capsule by mouth once daily 90 capsule 3   ondansetron  (ZOFRAN ) 4 MG tablet TAKE 1 TABLET BY MOUTH EVERY 8 HOURS AS NEEDED FOR NAUSEA FOR VOMITING 30 tablet 0   OneTouch Delica Lancets 33G MISC 1 each by Does not apply route daily. 100 each 2   pioglitazone  (ACTOS ) 15 MG tablet Take 1 tablet (15 mg total) by mouth daily. 90 tablet 1   rizatriptan  (MAXALT ) 10 MG tablet May repeat in 2 hours if needed (Patient taking differently: Take 10 mg by mouth as needed for migraine. May repeat in 2 hours if needed) 10 tablet 11   rosuvastatin  (CRESTOR ) 40 MG tablet Take 1 tablet (40 mg total) by mouth at bedtime. 90 tablet 1   sertraline (ZOLOFT) 25 MG tablet Take 1 tablet (25 mg total) by mouth daily with breakfast. 30 tablet 1    VENTOLIN  HFA 108 (90 Base) MCG/ACT inhaler INHALE 2 PUFFS BY MOUTH EVERY 6 HOURS AS NEEDED FOR WHEEZING OR SHORTNESS OF BREATH 36 g 0   No current facility-administered medications for this visit.     Musculoskeletal: Strength & Muscle Tone: within normal limits Gait & Station: walks with a cane Patient leans: N/A  Psychiatric Specialty Exam: Review of Systems  Psychiatric/Behavioral:  The patient is nervous/anxious.     Blood pressure 124/80, pulse 88, temperature 98.4 F (36.9 C), temperature source Temporal, height 5' 1 (1.549 m), weight 215 lb (97.5 kg), SpO2 98%.Body mass index is 40.62 kg/m.  General Appearance: Casual  Eye Contact:  Fair  Speech:  Clear and Coherent  Volume:  Normal  Mood:  Anxious  Affect:  Congruent  Thought Process:  Goal Directed and Descriptions of Associations: Intact  Orientation:  Full (Time, Place, and Person)  Thought Content: Logical   Suicidal Thoughts:  No  Homicidal Thoughts:  No  Memory:  Immediate;   Fair Recent;   Fair Remote;   Fair  Judgement:  Fair  Insight:  Fair  Psychomotor Activity:  Normal  Concentration:  Concentration: Fair and Attention Span: Fair  Recall:  [employer] of Knowledge: Fair  Language: Fair  Akathisia:  No  Handed:  Right  AIMS (if indicated): not done  Assets:  Communication Skills Desire for Improvement Housing Social Support Transportation  ADL's:  Intact  Cognition:  WNL  Sleep:  Fair   Screenings: AIMS    Flowsheet Row Office Visit from 12/26/2021 in [facility] Office Visit from 10/24/2021 in [facility]  AIMS Total Score 0 0   GAD-7    Flowsheet Row Office Visit from 01/21/2024 in [facility] Office Visit from 11/20/2023 in [facility] Office Visit from 08/20/2023 in [facility] Office Visit from 07[facility]18/2023 in [facility] Office Visit from 04/30/2023 in [facility]  Total GAD-7 Score 9 7 7  0 9   Mini-Mental    Flowsheet Row Office Visit from 12/04/2022 in [facility]  Total Score (max 30 points ) 30   PHQ2-9    Flowsheet Row Office Visit from 01/21/2024 in [facility] Office Visit from 11/20/2023 in [facility] Office Visit from 08/20/2023 in [facility] Office Visit from 07/18/2023 in [facility] Office Visit from 04/30/2023 in [facility]  PHQ-2 Total Score 2 4 4 2 3   PHQ-9 Total Score 10 9 10 7 8    Flowsheet Row Office Visit from 01/21/2024 in [facility] Office Visit from 10/29/2023 in [facility] Admission (Discharged) from 08/12/2023 in [facility]  C-SSRS RISK CATEGORY No Risk No Risk No Risk     Assessment and Plan: Oliviagrace Crisanti is a 63 year old African-American female, married, lives in [place], has a history of MDD, GAD, insomnia, OSA and CPAP was evaluated in office today.  Discussed assessment and plan as noted below.  Insomnia-unstable Chronic difficulty with sleep.  She is also on CPAP.  She does have a grandchild who has sleep problems and she is the primary caregiver at night which also affects her sleep. Continue Mirtazapine  15 mg at bedtime Discussed sleep hygiene techniques. Ensure CPAP machine is functioning properly.  GAD-unstable Currently with multiple situational stressors. Add Zoloft 25 mg daily with breakfast Continue Mirtazapine  15 mg at bedtime  MDD-unstable Currently struggling with depression symptoms mostly exacerbated by ongoing situational stressors. Add Zoloft 25 mg daily with  breakfast  Follow-up Follow-up in clinic in 1 month or sooner if needed.   Consent: Patient/Guardian gives verbal consent for treatment and assignment of benefits for services provided during this visit. Patient/Guardian expressed understanding and agreed to proceed.   This note was generated in part or whole with voice recognition software. Voice recognition is usually quite accurate but there are transcription errors that can and very often do occur. I apologize for any typographical errors that were not detected and corrected.    Zyasia Halbleib, MD 01/21/2024, 12:37 PM

## 2024-01-21 NOTE — Patient Instructions (Signed)
 Sertraline Tablets What is this medication? SERTRALINE (SER tra leen) treats depression, anxiety, obsessive-compulsive disorder (OCD), post-traumatic stress disorder (PTSD), and premenstrual dysphoric disorder (PMDD). It increases the amount of serotonin in the brain, a hormone that helps regulate mood. It belongs to a group of medications called SSRIs. This medicine may be used for other purposes; ask your health care provider or pharmacist if you have questions. COMMON BRAND NAME(S): Zoloft What should I tell my care team before I take this medication? They need to know if you have any of these conditions: Bleeding disorders Bipolar disorder or a family history of bipolar disorder Frequently drink alcohol Glaucoma Heart disease High blood pressure History of irregular heartbeat History of low levels of calcium, magnesium, or potassium in the blood Liver disease Receiving electroconvulsive therapy Seizures Suicidal thoughts, plans, or attempt by you or a family member Take medications that prevent or treat blood clots Thyroid disease An unusual or allergic reaction to sertraline, other medications, foods, dyes, or preservatives Pregnant or trying to get pregnant Breastfeeding How should I use this medication? Take this medication by mouth with a glass of water. Take it as directed on the prescription label at the same time every day. You can take it with or without food. If it upsets your stomach, take it with food. Do not take your medication more often than directed. Keep taking this medication unless your care team tells you to stop. Stopping it too quickly can cause serious side effects. It can also make your condition worse. A special MedGuide will be given to you by the pharmacist with each prescription and refill. Be sure to read this information carefully each time. Talk to your care team about the use of this medication in children. While it may be prescribed for children as  young as 7 years for selected conditions, precautions do apply. Overdosage: If you think you have taken too much of this medicine contact a poison control center or emergency room at once. NOTE: This medicine is only for you. Do not share this medicine with others. What if I miss a dose? If you miss a dose, take it as soon as you can. If it is almost time for your next dose, take only that dose. Do not take double or extra doses. What may interact with this medication? Do not take this medication with any of the following: Cisapride Dronedarone Linezolid MAOIs, such as Carbex, Eldepryl, Marplan, Nardil, and Parnate Methylene blue (injected into a vein) Pimozide Thioridazine This medication may also interact with the following: Alcohol Amphetamines Aspirin and aspirin-like medications Certain medications for fungal infections, such as ketoconazole, fluconazole, posaconazole, itraconazole Certain medications for irregular heart beat, such as flecainide, quinidine, propafenone Certain medications for mental health conditions Certain medications for migraine headaches, such as almotriptan, eletriptan, frovatriptan, naratriptan, rizatriptan, sumatriptan, zolmitriptan Certain medications for seizures, such as carbamazepine, valproic acid, phenytoin Certain medications for sleep Certain medications that prevent or treat blood clots, such as warfarin, enoxaparin, dalteparin Cimetidine Digoxin Diuretics Fentanyl Isoniazid Lithium NSAIDs, medications for pain and inflammation, such as ibuprofen or naproxen Other medications that cause heart rhythm changes, such as dofetilide Rasagiline Safinamide Supplements, such as St. John's wort, kava kava, valerian Tolbutamide Tramadol Tryptophan This list may not describe all possible interactions. Give your health care provider a list of all the medicines, herbs, non-prescription drugs, or dietary supplements you use. Also tell them if you smoke,  drink alcohol, or use illegal drugs. Some items may interact with your medicine.  What should I watch for while using this medication? Tell your care team if your symptoms do not get better or if they get worse. Visit your care team for regular checks on your progress. Because it may take several weeks to see the full effects of this medication, it is important to continue your treatment as prescribed by your care team. Patients and their families should watch out for new or worsening thoughts of suicide or depression. Also watch out for sudden changes in feelings such as feeling anxious, agitated, panicky, irritable, hostile, aggressive, impulsive, severely restless, overly excited and hyperactive, or not being able to sleep. If this happens, especially at the beginning of treatment or after a change in dose, call your care team. This medication may affect your coordination, reaction time, or judgment. Do not drive or operate machinery until you know how this medication affects you. Sit or stand up slowly to reduce the risk of dizzy or fainting spells. Drinking alcohol with this medication can increase the risk of these side effects. Your mouth may get dry. Chewing sugarless gum or sucking hard candy, and drinking plenty of water may help. Contact your care team if the problem does not go away or is severe. What side effects may I notice from receiving this medication? Side effects that you should report to your care team as soon as possible: Allergic reactions--skin rash, itching, hives, swelling of the face, lips, tongue, or throat Bleeding--bloody or black, tar-like stools, red or dark brown urine, vomiting blood or brown material that looks like coffee grounds, small red or purple spots on skin, unusual bleeding or bruising Heart rhythm changes--fast or irregular heartbeat, dizziness, feeling faint or lightheaded, chest pain, trouble breathing Low sodium level--muscle weakness, fatigue, dizziness,  headache, confusion Serotonin syndrome--irritability, confusion, fast or irregular heartbeat, muscle stiffness, twitching muscles, sweating, high fever, seizure, chills, vomiting, diarrhea Sudden eye pain or change in vision such as blurred vision, seeing halos around lights, vision loss Thoughts of suicide or self-harm, worsening mood Side effects that usually do not require medical attention (report these to your care team if they continue or are bothersome): Change in sex drive or performance Diarrhea Excessive sweating Nausea Tremors or shaking Upset stomach This list may not describe all possible side effects. Call your doctor for medical advice about side effects. You may report side effects to FDA at 1-800-FDA-1088. Where should I keep my medication? Keep out of the reach of children and pets. Store at room temperature between 20 and 25 degrees C (68 and 77 degrees F). Get rid of any unused medication after the expiration date. To get rid of medications that are no longer needed or expired: Take the medication to a medication take-back program. Check with your pharmacy or law enforcement to find a location. If you cannot return the medication, check the label or package insert to see if the medication should be thrown out in the garbage or flushed down the toilet. If you are not sure, ask your care team. If it is safe to put in the trash, empty the medication out of the container. Mix the medication with cat litter, dirt, coffee grounds, or other unwanted substance. Seal the mixture in a bag or container. Put it in the trash. NOTE: This sheet is a summary. It may not cover all possible information. If you have questions about this medicine, talk to your doctor, pharmacist, or health care provider.  2024 Elsevier/Gold Standard (2022-02-12 00:00:00)

## 2024-01-22 LAB — HEMOGLOBIN A1C: Hemoglobin A1C: 6.1

## 2024-01-22 LAB — HM DIABETES EYE EXAM

## 2024-01-26 ENCOUNTER — Telehealth: Payer: Self-pay | Admitting: Family Medicine

## 2024-01-26 NOTE — Telephone Encounter (Signed)
 nitroGLYCERIN  (NITROSTAT ) 0.4 MG SL tablet   metFORMIN  (GLUCOPHAGE -XR) 750 MG 24 hr tablet   gabapentin  (NEURONTIN ) 400 MG capsule   metoprolol  succinate (TOPROL -XL) 50 MG 24 hr tablet   olmesartan -hydrochlorothiazide  (BENICAR  HCT) 40-25 MG tablet   amLODipine  (NORVASC ) 2.5 MG tablet   montelukast  (SINGULAIR ) 10 MG tablet   cloNIDine  (CATAPRES ) 0.1 MG tablet   Requesting 90 day supply with 3 refills for all meds  In her chart these prescriptions have been sent to different pharmacy

## 2024-01-26 NOTE — Telephone Encounter (Signed)
 Prescriptions were sent to local [facility] if patient needing sent to mail, needs to let us  know.

## 2024-02-03 ENCOUNTER — Other Ambulatory Visit: Payer: Self-pay | Admitting: Family Medicine

## 2024-02-03 DIAGNOSIS — J4540 Moderate persistent asthma, uncomplicated: Secondary | ICD-10-CM

## 2024-02-03 DIAGNOSIS — I152 Hypertension secondary to endocrine disorders: Secondary | ICD-10-CM

## 2024-02-04 ENCOUNTER — Other Ambulatory Visit: Payer: Self-pay | Admitting: Family Medicine

## 2024-02-04 DIAGNOSIS — Z1231 Encounter for screening mammogram for malignant neoplasm of breast: Secondary | ICD-10-CM

## 2024-02-10 ENCOUNTER — Ambulatory Visit (INDEPENDENT_AMBULATORY_CARE_PROVIDER_SITE_OTHER): Payer: Commercial Managed Care - HMO | Admitting: Neurology

## 2024-02-10 ENCOUNTER — Other Ambulatory Visit: Payer: Self-pay | Admitting: Family Medicine

## 2024-02-10 VITALS — BP 138/38 | Ht 61.0 in | Wt 216.0 lb

## 2024-02-10 DIAGNOSIS — G43019 Migraine without aura, intractable, without status migrainosus: Secondary | ICD-10-CM

## 2024-02-10 DIAGNOSIS — I152 Hypertension secondary to endocrine disorders: Secondary | ICD-10-CM

## 2024-02-10 MED ORDER — EMGALITY 120 MG/ML ~~LOC~~ SOAJ: 120.0000 mg | SUBCUTANEOUS | 11 refills | Status: AC

## 2024-02-10 MED ORDER — RIZATRIPTAN BENZOATE 10 MG PO TABS: ORAL_TABLET | ORAL | 11 refills | Status: DC
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
## 2024-02-10 NOTE — Patient Instructions (Signed)
 Great to see you!! Continue current medications Call for increase in headache Follow-up in 1 year Thanks!!

## 2024-02-11 ENCOUNTER — Other Ambulatory Visit: Payer: Self-pay | Admitting: Neurology

## 2024-02-11 ENCOUNTER — Encounter: Payer: Self-pay | Admitting: Family Medicine

## 2024-02-13 ENCOUNTER — Ambulatory Visit (INDEPENDENT_AMBULATORY_CARE_PROVIDER_SITE_OTHER): Admitting: Family Medicine

## 2024-02-13 ENCOUNTER — Ambulatory Visit
Admission: RE | Admit: 2024-02-13 | Discharge: 2024-02-13 | Disposition: A | Discharge status: Home / Self Care | Source: Ambulatory Visit | Attending: Family Medicine | Admitting: Family Medicine

## 2024-02-13 ENCOUNTER — Encounter: Payer: Self-pay | Admitting: Family Medicine

## 2024-02-13 ENCOUNTER — Other Ambulatory Visit
Admission: RE | Admit: 2024-02-13 | Discharge: 2024-02-13 | Disposition: A | Discharge status: Home / Self Care | Source: Ambulatory Visit | Attending: Family Medicine | Admitting: Family Medicine

## 2024-02-13 VITALS — BP 114/76 | HR 69 | Resp 16 | Ht 61.0 in | Wt 217.3 lb

## 2024-02-13 DIAGNOSIS — R1032 Left lower quadrant pain: Secondary | ICD-10-CM

## 2024-02-13 DIAGNOSIS — I10 Essential (primary) hypertension: Secondary | ICD-10-CM | POA: Diagnosis not present

## 2024-02-13 DIAGNOSIS — E861 Hypovolemia: Secondary | ICD-10-CM | POA: Diagnosis present

## 2024-02-13 LAB — CBC WITH DIFFERENTIAL/PLATELET
Abs Immature Granulocytes: 0.02 K/uL (ref 0.00–0.07)
Basophils Absolute: 0.1 K/uL (ref 0.0–0.1)
Basophils Relative: 1 %
Eosinophils Absolute: 0.1 K/uL (ref 0.0–0.5)
Eosinophils Relative: 1 %
HCT: 35.7 % — ABNORMAL LOW (ref 36.0–46.0)
Hemoglobin: 12.1 g/dL (ref 12.0–15.0)
Immature Granulocytes: 0 %
Lymphocytes Relative: 33 %
Lymphs Abs: 3 K/uL (ref 0.7–4.0)
MCH: 29.1 pg (ref 26.0–34.0)
MCHC: 33.9 g/dL (ref 30.0–36.0)
MCV: 85.8 fL (ref 80.0–100.0)
Monocytes Absolute: 0.9 K/uL (ref 0.1–1.0)
Monocytes Relative: 9 %
Neutro Abs: 5 K/uL (ref 1.7–7.7)
Neutrophils Relative %: 56 %
Platelets: 338 K/uL (ref 150–400)
RBC: 4.16 MIL/uL (ref 3.87–5.11)
RDW: 14.9 % (ref 11.5–15.5)
WBC: 9.1 K/uL (ref 4.0–10.5)
nRBC: 0 % (ref 0.0–0.2)

## 2024-02-13 LAB — COMPREHENSIVE METABOLIC PANEL WITH GFR
ALT: 13 U/L (ref 0–44)
AST: 18 U/L (ref 15–41)
Albumin: 3.8 g/dL (ref 3.5–5.0)
Alkaline Phosphatase: 43 U/L (ref 38–126)
Anion gap: 12 (ref 5–15)
BUN: 16 mg/dL (ref 8–23)
CO2: 28 mmol/L (ref 22–32)
Calcium: 9.4 mg/dL (ref 8.9–10.3)
Chloride: 103 mmol/L (ref 98–111)
Creatinine, Ser: 0.65 mg/dL (ref 0.44–1.00)
GFR, Estimated: >60 mL/min (ref >60)
Glucose, Bld: 98 mg/dL (ref 70–99)
Potassium: 3.9 mmol/L (ref 3.5–5.1)
Sodium: 143 mmol/L (ref 135–145)
Total Bilirubin: 0.6 mg/dL (ref 0.0–1.2)
Total Protein: 6.9 g/dL (ref 6.5–8.1)

## 2024-02-13 MED ORDER — CIPROFLOXACIN HCL 500 MG PO TABS: 500.0000 mg | ORAL_TABLET | Freq: Two times a day (BID) | ORAL | 0 refills | Status: DC

## 2024-02-13 MED ORDER — METRONIDAZOLE 500 MG PO TABS: 500.0000 mg | ORAL_TABLET | Freq: Two times a day (BID) | ORAL | 0 refills | Status: DC

## 2024-02-13 MED ORDER — ONDANSETRON 4 MG PO TBDP: 4.0000 mg | ORAL_TABLET | Freq: Three times a day (TID) | ORAL | 0 refills | Status: DC | PRN

## 2024-02-13 NOTE — Patient Instructions (Signed)
 Hold amlodipine  if bp remains below 110/70 tonight Check bp in the morning and if below 110/70 take half dose of benicar  hydrochlorothiazide   You need to drink more fluids and monitor for dizziness, if unable to stay hydrated go to [facility] for further evaluation

## 2024-02-13 NOTE — Progress Notes (Signed)
 Name: Beverly Barrett   MRN: [id number]    DOB: 1960-09-14   Date:02/13/2024       Progress Note  Subjective  Chief Complaint  Chief Complaint  Patient presents with   Abdominal Pain    L side but hurts all over stomach   Nausea   Emesis    X4 days, unable to keep food/liquids   HPI   She has a personal history of diverticulitis, last colonoscopy was Dec 2024.  She was seen at [facility] March 2025 with similar symptoms and diagnosed with diverticulitis with elevated WBC and also positive CT scan. This episodes started 3 days ago with nausea and pain on LLQ, followed by diarrhea and vomiting, no blood in stools. She has been feeling warm. No appetite. She has been able to drink sips of fluids but not eating. Last episode of vomiting and diarrhea was Thursday night. Symptoms are slightly better, but is feeling weak.    Patient Active Problem List   Diagnosis Date Noted   Biliary dyskinesia 08/12/2023   Gastric erythema 08/07/2023   Angina pectoris associated with type 2 diabetes mellitus (HCC) 12/04/2022   Insomnia due to medical condition 12/04/2022   Bereavement 12/04/2022   MDD (major depressive disorder), recurrent, in full remission (HCC) 12/26/2021   Polyp of colon    Chronic idiopathic constipation 12/03/2021   Asthma, well controlled 12/03/2021   Migraine without aura and without status migrainosus, not intractable 12/03/2021   Memory loss 08/27/2021   Depression, major, recurrent, mild (HCC) 07/19/2021   Sleep disorder 07/19/2021   Dyslipidemia associated with type 2 diabetes mellitus (HCC) 03/06/2020   Chronic low back pain 12/31/2018   Grade II internal hemorrhoids 05/16/2017   Mixed stress and urge urinary incontinence 11/12/2016   Degenerative arthritis of lumbar spine 11/12/2016   Common migraine with intractable migraine 09/20/2016   Iron deficiency anemia 09/12/2016   GAD (generalized anxiety disorder) 12/20/2015   OSA on CPAP 12/20/2015   Benign neoplasm of  sigmoid colon    Chronic pain of multiple joints 06/14/2014   Arthritis, degenerative 06/14/2014   Morbid obesity with BMI of 40.0-44.9, adult (HCC) 06/14/2014   Multinodular goiter 08/18/2013   Hypertension goal BP (blood pressure) < 140/90 12/07/2010   Familial multiple lipoprotein-type hyperlipidemia 12/07/2010   Allergic rhinitis 10/13/2008    Past Surgical History:  Procedure Laterality Date   ABDOMINAL HYSTERECTOMY  1996   BIOPSY  08/07/2023   Procedure: BIOPSY;  Surgeon: Unk Corinn Skiff, MD;  Location: [facility] ENDOSCOPY;  Service: Gastroenterology;;   BLADDER SUSPENSION  1997   CESAREAN SECTION  1995   CHOLECYSTECTOMY     COLONOSCOPY WITH PROPOFOL  N/A 05/29/2015   Procedure: COLONOSCOPY WITH PROPOFOL ;  Surgeon: Rogelia Copping, MD;  Location: [facility];  Service: Endoscopy;  Laterality: N/A;  Latex allergy sleep apnea - no CPAP machine (yet)   COLONOSCOPY WITH PROPOFOL  N/A 12/18/2021   Procedure: COLONOSCOPY WITH PROPOFOL ;  Surgeon: Unk Corinn Skiff, MD;  Location: [facility] ENDOSCOPY;  Service: Gastroenterology;  Laterality: N/A;   COLONOSCOPY WITH PROPOFOL  N/A 08/07/2023   Procedure: COLONOSCOPY WITH PROPOFOL ;  Surgeon: Unk Corinn Skiff, MD;  Location: [facility] ENDOSCOPY;  Service: Gastroenterology;  Laterality: N/A;   ECTOPIC PREGNANCY SURGERY     ESOPHAGOGASTRODUODENOSCOPY N/A 04/23/2017   Procedure: ESOPHAGOGASTRODUODENOSCOPY (EGD);  Surgeon: Unk Corinn Skiff, MD;  Location: [facility];  Service: Gastroenterology;  Laterality: N/A;  Latex allergy sleep apnea   ESOPHAGOGASTRODUODENOSCOPY (EGD) WITH PROPOFOL  N/A 08/07/2023   Procedure:  ESOPHAGOGASTRODUODENOSCOPY (EGD) WITH PROPOFOL ;  Surgeon: Unk Corinn Skiff, MD;  Location: [facility] ENDOSCOPY;  Service: Gastroenterology;  Laterality: N/A;   POLYPECTOMY  05/29/2015   Procedure: POLYPECTOMY;  Surgeon: Rogelia Copping, MD;  Location: [facility];  Service: Endoscopy;;   POLYPECTOMY  08/07/2023   Procedure:  POLYPECTOMY;  Surgeon: Unk Corinn Skiff, MD;  Location: [facility] ENDOSCOPY;  Service: Gastroenterology;;    Family History  Problem Relation Age of Onset   Diabetes Sister    Anxiety disorder Sister    Depression Sister    Hypertension Sister    Cirrhosis Mother    Diabetes Mother    Cirrhosis Father    Breast cancer Sister 43   Heart attack Brother    Alcohol abuse Brother    Drug abuse Brother    Prostate cancer Neg Hx    Kidney cancer Neg Hx    Bladder Cancer Neg Hx     Social History   Tobacco Use   Smoking status: Never    Passive exposure: Never   Smokeless tobacco: Never  Substance Use Topics   Alcohol use: No    Alcohol/week: 0.0 standard drinks of alcohol     Current Outpatient Medications:    acetaminophen  (TYLENOL ) 500 MG tablet, Take 2 tablets (1,000 mg total) by mouth every 6 (six) hours as needed for mild pain (pain score 1-3)., Disp: , Rfl:    amLODipine  (NORVASC ) 2.5 MG tablet, Take 1 tablet (2.5 mg total) by mouth daily., Disp: 90 tablet, Rfl: 1   aspirin  81 MG EC tablet, Take 81 mg by mouth daily., Disp: , Rfl:    Cholecalciferol (VITAMIN D ) 50 MCG (2000 UT) CAPS, Take 2,000 Units by mouth daily., Disp: , Rfl:    cloNIDine  (CATAPRES ) 0.1 MG tablet, Take 1 tablet (0.1 mg total) by mouth at bedtime., Disp: 90 tablet, Rfl: 1   dimenhyDRINATE (DRAMAMINE) 50 MG tablet, Take 50 mg by mouth every 8 (eight) hours as needed for nausea., Disp: , Rfl:    fluticasone  (FLONASE ) 50 MCG/ACT nasal spray, Place 2 sprays into both nostrils daily as needed for allergies., Disp: 48 mL, Rfl: 0   Fluticasone  Furoate (ARNUITY ELLIPTA ) 100 MCG/ACT AEPB, Take 1 puff by mouth daily at 12 noon., Disp: 30 each, Rfl: 5   gabapentin  (NEURONTIN ) 400 MG capsule, Take 1 capsule in the morning, take 2 at night, Disp: 270 capsule, Rfl: 1   Galcanezumab -gnlm (EMGALITY ) 120 MG/ML SOAJ, Inject 120 mg into the skin every 30 (thirty) days., Disp: 1.12 mL, Rfl: 11   glucose blood test strip, Use  as instructed, Disp: 100 each, Rfl: 12   Iron-FA-B Cmp-C-Biot-Probiotic (FUSION PLUS) CAPS, Take 1 capsule by mouth once daily, Disp: 30 capsule, Rfl: 0   Lancet Devices (ONE TOUCH DELICA LANCING DEV) MISC, 1 each by Does not apply route daily., Disp: 1 each, Rfl: 0   levothyroxine  (SYNTHROID ) 100 MCG tablet, Take 100 mcg by mouth daily before breakfast., Disp: , Rfl:    linaclotide  (LINZESS ) 145 MCG CAPS capsule, Take 1 capsule (145 mcg total) by mouth daily., Disp: 90 capsule, Rfl: 1   loratadine (CLARITIN) 10 MG tablet, Take 10 mg by mouth daily., Disp: , Rfl:    metFORMIN  (GLUCOPHAGE -XR) 750 MG 24 hr tablet, Take 2 tablets (1,500 mg total) by mouth daily with breakfast., Disp: 180 tablet, Rfl: 1   metoCLOPramide  (REGLAN ) 5 MG tablet, Take 1 tablet (5 mg total) by mouth daily before supper., Disp: 90 tablet, Rfl: 1   metoprolol   succinate (TOPROL -XL) 50 MG 24 hr tablet, TAKE 1 TABLET BY MOUTH ONCE DAILY WITH OR IMMEDIATELY FOLLOWING A MEAL, Disp: 90 tablet, Rfl: 0   mirtazapine  (REMERON ) 15 MG tablet, TAKE 1 TABLET BY MOUTH AT BEDTIME, Disp: 30 tablet, Rfl: 5   montelukast  (SINGULAIR ) 10 MG tablet, Take 1 tablet (10 mg total) by mouth at bedtime., Disp: 90 tablet, Rfl: 1   nitroGLYCERIN  (NITROSTAT ) 0.4 MG SL tablet, Place 1 tablet (0.4 mg total) under the tongue every 5 (five) minutes as needed., Disp: 25 tablet, Rfl: 0   olmesartan -hydrochlorothiazide  (BENICAR  HCT) 40-25 MG tablet, Take 1 tablet by mouth daily., Disp: 90 tablet, Rfl: 1   omeprazole  (PRILOSEC) 40 MG capsule, Take 1 capsule by mouth once daily, Disp: 90 capsule, Rfl: 3   ondansetron  (ZOFRAN ) 4 MG tablet, TAKE 1 TABLET BY MOUTH EVERY 8 HOURS AS NEEDED FOR NAUSEA FOR VOMITING, Disp: 30 tablet, Rfl: 0   OneTouch Delica Lancets 33G MISC, 1 each by Does not apply route daily., Disp: 100 each, Rfl: 2   pioglitazone  (ACTOS ) 15 MG tablet, Take 1 tablet (15 mg total) by mouth daily., Disp: 90 tablet, Rfl: 1   rizatriptan  (MAXALT ) 10 MG  tablet, Take 1 tablet at onset of migraine. May repeat in 2 hours if needed. No more than 2 in 24 hr period, Disp: 10 tablet, Rfl: 11   rosuvastatin  (CRESTOR ) 40 MG tablet, Take 1 tablet (40 mg total) by mouth at bedtime., Disp: 90 tablet, Rfl: 1   sertraline  (ZOLOFT ) 25 MG tablet, Take 1 tablet (25 mg total) by mouth daily with breakfast., Disp: 30 tablet, Rfl: 1   VENTOLIN  HFA 108 (90 Base) MCG/ACT inhaler, INHALE 2 PUFFS BY MOUTH EVERY 6 HOURS AS NEEDED FOR WHEEZING OR SHORTNESS OF BREATH, Disp: 36 g, Rfl: 0  Allergies  Allergen Reactions   Latex Swelling    Pt unsure of reaction pt states something happened while in the hospital after surgery.    I personally reviewed active problem list, medication list, allergies with the patient/caregiver today.   ROS  Ten systems reviewed and is negative except as mentioned in HPI    Objective Physical Exam Constitutional: Patient appears well-developed and well-nourished. Obese  No distress.  HEENT: head atraumatic, normocephalic, pupils equal and reactive to light, neck supple Cardiovascular: Normal rate, regular rhythm and normal heart sounds.  No murmur heard. No BLE edema. Pulmonary/Chest: Effort normal and breath sounds normal. No respiratory distress. Abdominal: Soft.  There is LLQ pain with some guarding and rebound  Psychiatric: Patient has a normal mood and affect. behavior is normal. Judgment and thought content normal.   Vitals:   02/13/24 1111  BP: 108/64  Pulse: 69  Resp: 16  SpO2: 99%  Weight: 217 lb 4.8 oz (98.6 kg)  Height: 5' 1 (1.549 m)    Body mass index is 41.06 kg/m.  Recent Results (from the past 2160 hours)  HM DIABETES EYE EXAM     Status: None   Collection Time: 01/22/24 12:00 AM  Result Value Ref Range   HM Diabetic Eye Exam No Retinopathy No Retinopathy    Comment: KC  Hemoglobin A1c     Status: None   Collection Time: 01/22/24 12:00 AM  Result Value Ref Range   Hemoglobin A1C 6.1     Comment: KC       PHQ2/9:    02/13/2024   11:07 AM 01/21/2024   11:34 AM 11/20/2023   10:42 AM 08/20/2023    3:32  PM 07/18/2023    9:32 AM  Depression screen PHQ 2/9  Decreased Interest 3  2 2 1   Down, Depressed, Hopeless 3  2 2 1   PHQ - 2 Score 6  4 4 2   Altered sleeping 3  1 2 1   Tired, decreased energy 3  1 2 1   Change in appetite 1  1 2 1   Feeling bad or failure about yourself  1  1 0 1  Trouble concentrating 3  1 0 1  Moving slowly or fidgety/restless 0  0 0 0  Suicidal thoughts 0  0 0 0  PHQ-9 Score 17  9 10 7   Difficult doing work/chores Very difficult  Very difficult Somewhat difficult Very difficult     Information is confidential and restricted. Go to Review Flowsheets to unlock data.    phq 9 is positive  Fall Risk:    02/13/2024   11:07 AM 11/20/2023   10:44 AM 09/08/2023    3:10 PM 08/20/2023    3:32 PM 07/18/2023    9:24 AM  Fall Risk   Falls in the past year? 1 1 1 1  0  Number falls in past yr: 0 0 0 0 0  Injury with Fall? 0 0 0 0 0  Risk for fall due to : Impaired balance/gait Impaired balance/gait Impaired balance/gait Impaired balance/gait No Fall Risks  Follow up Falls evaluation completed Education provided;Falls evaluation completed;Falls prevention discussed Falls prevention discussed Falls prevention discussed;Education provided;Falls evaluation completed Falls prevention discussed;Education provided;Falls evaluation completed     Assessment & Plan  1. Abdominal pain, LLQ (Primary)  - metroNIDAZOLE (FLAGYL) 500 MG tablet; Take 1 tablet (500 mg total) by mouth 2 (two) times daily for 7 days.  Dispense: 14 tablet; Refill: 0 - ciprofloxacin  (CIPRO ) 500 MG tablet; Take 1 tablet (500 mg total) by mouth 2 (two) times daily for 7 days.  Dispense: 14 tablet; Refill: 0 - ondansetron  (ZOFRAN -ODT) 4 MG disintegrating tablet; Take 1 tablet (4 mg total) by mouth every 8 (eight) hours as needed for nausea or vomiting.  Dispense: 20 tablet; Refill: 0 - CBC with  Differential/Platelet; Future - Comprehensive metabolic panel with GFR; Future - DG Abd 1 View; Future  2. Hypertension, benign  BP was initially low, possibly from hypovolemia but explained if she gest weaker or abdominal pain gets worse needs to go to [facility]

## 2024-02-17 ENCOUNTER — Other Ambulatory Visit: Payer: Self-pay | Admitting: Family Medicine

## 2024-02-17 DIAGNOSIS — E1169 Type 2 diabetes mellitus with other specified complication: Secondary | ICD-10-CM

## 2024-02-17 DIAGNOSIS — J4540 Moderate persistent asthma, uncomplicated: Secondary | ICD-10-CM

## 2024-02-18 ENCOUNTER — Encounter: Payer: Self-pay | Admitting: Psychiatry

## 2024-02-18 ENCOUNTER — Ambulatory Visit (INDEPENDENT_AMBULATORY_CARE_PROVIDER_SITE_OTHER): Admitting: Psychiatry

## 2024-02-18 ENCOUNTER — Other Ambulatory Visit: Payer: Self-pay

## 2024-02-18 VITALS — BP 127/78 | HR 68 | Temp 97.3°F | Ht 61.0 in | Wt 220.8 lb

## 2024-02-18 DIAGNOSIS — F411 Generalized anxiety disorder: Secondary | ICD-10-CM

## 2024-02-18 DIAGNOSIS — G4701 Insomnia due to medical condition: Secondary | ICD-10-CM | POA: Diagnosis not present

## 2024-02-18 DIAGNOSIS — F3341 Major depressive disorder, recurrent, in partial remission: Secondary | ICD-10-CM

## 2024-02-18 MED ORDER — HYDROXYZINE HCL 10 MG PO TABS: 10.0000 mg | ORAL_TABLET | Freq: Two times a day (BID) | ORAL | 1 refills | Status: DC | PRN

## 2024-02-18 MED ORDER — MIRTAZAPINE 15 MG PO TABS: 22.5000 mg | ORAL_TABLET | Freq: Every day | ORAL | 1 refills | Status: DC

## 2024-02-18 NOTE — Patient Instructions (Signed)
 Insomnia Insomnia is a sleep disorder that makes it difficult to fall asleep or stay asleep. Insomnia can cause fatigue, low energy, difficulty concentrating, mood swings, and poor performance at work or school. There are three different ways to classify insomnia: Difficulty falling asleep. Difficulty staying asleep. Waking up too early in the morning. Any type of insomnia can be long-term (chronic) or short-term (acute). Both are common. Short-term insomnia usually lasts for 3 months or less. Chronic insomnia occurs at least three times a week for longer than 3 months. What are the causes? Insomnia may be caused by another condition, situation, or substance, such as: Having certain mental health conditions, such as anxiety and depression. Using caffeine, alcohol, tobacco, or drugs. Having gastrointestinal conditions, such as gastroesophageal reflux disease (GERD). Having certain medical conditions. These include: Asthma. Alzheimer's disease. Stroke. Chronic pain. An overactive thyroid gland (hyperthyroidism). Other sleep disorders, such as restless legs syndrome and sleep apnea. Menopause. Sometimes, the cause of insomnia may not be known. What increases the risk? Risk factors for insomnia include: Gender. Females are affected more often than males. Age. Insomnia is more common as people get older. Stress and certain medical and mental health conditions. Lack of exercise. Having an irregular work schedule. This may include working night shifts and traveling between different time zones. What are the signs or symptoms? If you have insomnia, the main symptom is having trouble falling asleep or having trouble staying asleep. This may lead to other symptoms, such as: Feeling tired or having low energy. Feeling nervous about going to sleep. Not feeling rested in the morning. Having trouble concentrating. Feeling irritable, anxious, or depressed. How is this diagnosed? This condition  may be diagnosed based on: Your symptoms and medical history. Your health care provider may ask about: Your sleep habits. Any medical conditions you have. Your mental health. A physical exam. How is this treated? Treatment for insomnia depends on the cause. Treatment may focus on treating an underlying condition that is causing the insomnia. Treatment may also include: Medicines to help you sleep. Counseling or therapy. Lifestyle adjustments to help you sleep better. Follow these instructions at home: Eating and drinking  Limit or avoid alcohol, caffeinated beverages, and products that contain nicotine and tobacco, especially close to bedtime. These can disrupt your sleep. Do not eat a large meal or eat spicy foods right before bedtime. This can lead to digestive discomfort that can make it hard for you to sleep. Sleep habits  Keep a sleep diary to help you and your health care provider figure out what could be causing your insomnia. Write down: When you sleep. When you wake up during the night. How well you sleep and how rested you feel the next day. Any side effects of medicines you are taking. What you eat and drink. Make your bedroom a dark, comfortable place where it is easy to fall asleep. Put up shades or blackout curtains to block light from outside. Use a white noise machine to block noise. Keep the temperature cool. Limit screen use before bedtime. This includes: Not watching TV. Not using your smartphone, tablet, or computer. Stick to a routine that includes going to bed and waking up at the same times every day and night. This can help you fall asleep faster. Consider making a quiet activity, such as reading, part of your nighttime routine. Try to avoid taking naps during the day so that you sleep better at night. Get out of bed if you are still awake after  15 minutes of trying to sleep. Keep the lights down, but try reading or doing a quiet activity. When you feel  sleepy, go back to bed. General instructions Take over-the-counter and prescription medicines only as told by your health care provider. Exercise regularly as told by your health care provider. However, avoid exercising in the hours right before bedtime. Use relaxation techniques to manage stress. Ask your health care provider to suggest some techniques that may work well for you. These may include: Breathing exercises. Routines to release muscle tension. Visualizing peaceful scenes. Make sure that you drive carefully. Do not drive if you feel very sleepy. Keep all follow-up visits. This is important. Contact a health care provider if: You are tired throughout the day. You have trouble in your daily routine due to sleepiness. You continue to have sleep problems, or your sleep problems get worse. Get help right away if: You have thoughts about hurting yourself or someone else. Get help right away if you feel like you may hurt yourself or others, or have thoughts about taking your own life. Go to your nearest emergency room or: Call 911. Call the National Suicide Prevention Lifeline at (906)021-1611 or 988. This is open 24 hours a day. Text the Crisis Text Line at 325-069-2793. Summary Insomnia is a sleep disorder that makes it difficult to fall asleep or stay asleep. Insomnia can be long-term (chronic) or short-term (acute). Treatment for insomnia depends on the cause. Treatment may focus on treating an underlying condition that is causing the insomnia. Keep a sleep diary to help you and your health care provider figure out what could be causing your insomnia. This information is not intended to replace advice given to you by your health care provider. Make sure you discuss any questions you have with your health care provider. Document Revised: 06/25/2021 Document Reviewed: 06/25/2021 Elsevier Patient Education  2024 [employer].

## 2024-02-18 NOTE — Progress Notes (Unsigned)
 [facility] MD OP Progress Note  02/18/2024 12:02 PM Beverly Barrett  MRN:  [id number]  Chief Complaint:  Chief Complaint  Patient presents with   Follow-up   Anxiety   Depression   Medication Refill   Discussed the use of AI scribe software for clinical note transcription with the patient, who gave verbal consent to proceed.  History of Present Illness Beverly Barrett is a 63 year old African-American female, married, lives in [place], has a history of MDD, GAD, chronic pain, arthritis, history of obstructive sleep apnea on CPAP, hypothyroidism, migraine headaches was evaluated in office today.  She manages her anxiety and depression with mirtazapine  15 mg at bedtime and sertraline  25 mg in the morning. She experiences difficulty sleeping, often getting only about five hours of sleep per night. Despite using a CPAP machine and taking gabapentin  at night for pain, which also helps her relax, she sometimes feels tired and takes naps during the day.  Her granddaughter, who is four years old, has a lot of energy and often stays up until 2 or 3 AM, affecting her sleep schedule. She has no changes in appetite, weight, or medication regimen.  She denies any suicidality, homicidality or perceptual disturbances.  She denies any concerns with her memory.   Visit Diagnosis:    ICD-10-CM   1. Recurrent major depressive disorder, in partial remission (HCC)  F33.41     2. GAD (generalized anxiety disorder)  F41.1 mirtazapine  (REMERON ) 15 MG tablet    hydrOXYzine  (ATARAX ) 10 MG tablet    3. Insomnia due to medical condition  G47.01 mirtazapine  (REMERON ) 15 MG tablet    hydrOXYzine  (ATARAX ) 10 MG tablet   mood, insomnia, OSA on CPAP      Past Psychiatric History: I have reviewed past psychiatric history from progress note on 01/21/2018.  Past Medical History:  Past Medical History:  Diagnosis Date   Anginal pain (HCC)    none in approx 2 yrs   Anxiety    Asthma    Benign  neoplasm of sigmoid colon    Chronic constipation    Chronic lower back pain    Common migraine with intractable migraine 09/20/2016   Depression    suicidal ideation   GERD (gastroesophageal reflux disease)    Headache    migraines -3x/wk   Hyperlipidemia    Hypertension    Hypothyroidism    Iron deficiency anemia    Morbid obesity with BMI of 40.0-44.9, adult (HCC)    Motion sickness    cars   Multilevel degenerative disc disease    Multinodular goiter    Shortness of breath dyspnea    Sleep apnea    CPAP   Type 2 diabetes mellitus with diabetic polyneuropathy, without long-term current use of insulin  (HCC)    Vertigo    Vitamin D  deficiency    Wears dentures    partial upper    Past Surgical History:  Procedure Laterality Date   ABDOMINAL HYSTERECTOMY  1996   BIOPSY  08/07/2023   Procedure: BIOPSY;  Surgeon: Unk Corinn Skiff, MD;  Location: [facility] ENDOSCOPY;  Service: Gastroenterology;;   BLADDER SUSPENSION  1997   CESAREAN SECTION  1995   CHOLECYSTECTOMY     COLONOSCOPY WITH PROPOFOL  N/A 05/29/2015   Procedure: COLONOSCOPY WITH PROPOFOL ;  Surgeon: Rogelia Copping, MD;  Location: [facility];  Service: Endoscopy;  Laterality: N/A;  Latex allergy sleep apnea - no CPAP machine (yet)   COLONOSCOPY WITH PROPOFOL  N/A 12/18/2021   Procedure:  COLONOSCOPY WITH PROPOFOL ;  Surgeon: Unk Corinn Skiff, MD;  Location: [facility] ENDOSCOPY;  Service: Gastroenterology;  Laterality: N/A;   COLONOSCOPY WITH PROPOFOL  N/A 08/07/2023   Procedure: COLONOSCOPY WITH PROPOFOL ;  Surgeon: Unk Corinn Skiff, MD;  Location: [facility] ENDOSCOPY;  Service: Gastroenterology;  Laterality: N/A;   ECTOPIC PREGNANCY SURGERY     ESOPHAGOGASTRODUODENOSCOPY N/A 04/23/2017   Procedure: ESOPHAGOGASTRODUODENOSCOPY (EGD);  Surgeon: Unk Corinn Skiff, MD;  Location: [facility];  Service: Gastroenterology;  Laterality: N/A;  Latex allergy sleep apnea   ESOPHAGOGASTRODUODENOSCOPY (EGD) WITH PROPOFOL  N/A  08/07/2023   Procedure: ESOPHAGOGASTRODUODENOSCOPY (EGD) WITH PROPOFOL ;  Surgeon: Unk Corinn Skiff, MD;  Location: [facility] ENDOSCOPY;  Service: Gastroenterology;  Laterality: N/A;   POLYPECTOMY  05/29/2015   Procedure: POLYPECTOMY;  Surgeon: Rogelia Copping, MD;  Location: [facility];  Service: Endoscopy;;   POLYPECTOMY  08/07/2023   Procedure: POLYPECTOMY;  Surgeon: Unk Corinn Skiff, MD;  Location: [facility] ENDOSCOPY;  Service: Gastroenterology;;    Family Psychiatric History: I have reviewed family psychiatric history from progress note on 01/21/2018.  Family History:  Family History  Problem Relation Age of Onset   Diabetes Sister    Anxiety disorder Sister    Depression Sister    Hypertension Sister    Cirrhosis Mother    Diabetes Mother    Cirrhosis Father    Breast cancer Sister 72   Heart attack Brother    Alcohol abuse Brother    Drug abuse Brother    Prostate cancer Neg Hx    Kidney cancer Neg Hx    Bladder Cancer Neg Hx     Social History: I have reviewed social history from progress note on 01/21/2018. Social History   Socioeconomic History   Marital status: Married    Spouse name: Training and development officer   Number of children: 3   Years of education: Not on file   Highest education level: 12th grade  Occupational History   Not on file  Tobacco Use   Smoking status: Never    Passive exposure: Never   Smokeless tobacco: Never  Vaping Use   Vaping status: Never Used  Substance and Sexual Activity   Alcohol use: No    Alcohol/week: 0.0 standard drinks of alcohol   Drug use: No   Sexual activity: Not Currently    Partners: Male  Other Topics Concern   Not on file  Social History Narrative   Lives with husband and one daughter    She does not work, husband on disability    Social Drivers of Health   Financial Resource Strain: Medium Risk (02/12/2024)   Overall Financial Resource Strain (CARDIA)    Difficulty of Paying Living Expenses: Somewhat hard  Food  Insecurity: Food Insecurity Present (02/12/2024)   Hunger Vital Sign    Worried About Running Out of Food in the Last Year: Sometimes true    Ran Out of Food in the Last Year: Patient declined  Transportation Needs: No Transportation Needs (02/12/2024)   PRAPARE - Administrator, Civil Service (Medical): No    Lack of Transportation (Non-Medical): No  Physical Activity: Insufficiently Active (02/12/2024)   Exercise Vital Sign    Days of Exercise per Week: 3 days    Minutes of Exercise per Session: 20 min  Stress: Stress Concern Present (02/12/2024)   [employer] of Occupational Health - Occupational Stress Questionnaire    Feeling of Stress: Rather much  Social Connections: Moderately Isolated (02/12/2024)   Social Connection and Isolation Panel  Frequency of Communication with Friends and Family: More than three times a week    Frequency of Social Gatherings with Friends and Family: Never    Attends Religious Services: Patient declined    Database administrator or Organizations: No    Attends Engineer, structural: Not on file    Marital Status: Married    Allergies:  Allergies  Allergen Reactions   Latex Swelling    Pt unsure of reaction pt states something happened while in the hospital after surgery.    Metabolic Disorder Labs: Lab Results  Component Value Date   HGBA1C 6.1 01/22/2024   MPG 148 08/06/2021   MPG 162.81 08/28/2019   No results found for: PROLACTIN Lab Results  Component Value Date   CHOL 136 04/04/2023   TRIG 136 04/04/2023   HDL 57 04/04/2023   CHOLHDL 2.4 04/04/2023   VLDL 44 (H) 02/12/2017   LDLCALC 57 04/04/2023   LDLCALC 63 04/03/2022   Lab Results  Component Value Date   TSH 0.48 03/04/2022   TSH 0.01 (L) 12/03/2021    Therapeutic Level Labs: No results found for: LITHIUM No results found for: VALPROATE No results found for: CBMZ  Current Medications: Current Outpatient Medications  Medication Sig  Dispense Refill   hydrOXYzine  (ATARAX ) 10 MG tablet Take 1 tablet (10 mg total) by mouth 2 (two) times daily as needed for anxiety (and sleep). 60 tablet 1   acetaminophen  (TYLENOL ) 500 MG tablet Take 2 tablets (1,000 mg total) by mouth every 6 (six) hours as needed for mild pain (pain score 1-3).     amLODipine  (NORVASC ) 2.5 MG tablet Take 1 tablet (2.5 mg total) by mouth daily. 90 tablet 1   aspirin  81 MG EC tablet Take 81 mg by mouth daily.     Cholecalciferol (VITAMIN D ) 50 MCG (2000 UT) CAPS Take 2,000 Units by mouth daily.     ciprofloxacin  (CIPRO ) 500 MG tablet Take 1 tablet (500 mg total) by mouth 2 (two) times daily for 7 days. 14 tablet 0   cloNIDine  (CATAPRES ) 0.1 MG tablet Take 1 tablet (0.1 mg total) by mouth at bedtime. 90 tablet 1   dimenhyDRINATE (DRAMAMINE) 50 MG tablet Take 50 mg by mouth every 8 (eight) hours as needed for nausea.     fluticasone  (FLONASE ) 50 MCG/ACT nasal spray Place 2 sprays into both nostrils daily as needed for allergies. 48 mL 0   Fluticasone  Furoate (ARNUITY ELLIPTA ) 100 MCG/ACT AEPB Take 1 puff by mouth daily at 12 noon. 30 each 5   gabapentin  (NEURONTIN ) 400 MG capsule Take 1 capsule in the morning, take 2 at night 270 capsule 1   Galcanezumab -gnlm (EMGALITY ) 120 MG/ML SOAJ Inject 120 mg into the skin every 30 (thirty) days. 1.12 mL 11   glucose blood test strip Use as instructed 100 each 12   Iron-FA-B Cmp-C-Biot-Probiotic (FUSION PLUS) CAPS Take 1 capsule by mouth once daily 30 capsule 0   Lancet Devices (ONE TOUCH DELICA LANCING DEV) MISC 1 each by Does not apply route daily. 1 each 0   levothyroxine  (SYNTHROID ) 100 MCG tablet Take 100 mcg by mouth daily before breakfast.     linaclotide  (LINZESS ) 145 MCG CAPS capsule Take 1 capsule (145 mcg total) by mouth daily. 90 capsule 1   loratadine (CLARITIN) 10 MG tablet Take 10 mg by mouth daily.     metFORMIN  (GLUCOPHAGE -XR) 750 MG 24 hr tablet Take 2 tablets (1,500 mg total) by mouth daily with breakfast.  180 tablet 1   metoCLOPramide  (REGLAN ) 5 MG tablet Take 1 tablet (5 mg total) by mouth daily before supper. 90 tablet 1   metoprolol  succinate (TOPROL -XL) 50 MG 24 hr tablet TAKE 1 TABLET BY MOUTH ONCE DAILY WITH OR IMMEDIATELY FOLLOWING A MEAL 90 tablet 0   metroNIDAZOLE  (FLAGYL ) 500 MG tablet Take 1 tablet (500 mg total) by mouth 2 (two) times daily for 7 days. 14 tablet 0   mirtazapine  (REMERON ) 15 MG tablet Take 1.5 tablets (22.5 mg total) by mouth at bedtime. 45 tablet 1   montelukast  (SINGULAIR ) 10 MG tablet Take 1 tablet (10 mg total) by mouth at bedtime. 90 tablet 1   nitroGLYCERIN  (NITROSTAT ) 0.4 MG SL tablet Place 1 tablet (0.4 mg total) under the tongue every 5 (five) minutes as needed. 25 tablet 0   olmesartan -hydrochlorothiazide  (BENICAR  HCT) 40-25 MG tablet Take 1 tablet by mouth daily. 90 tablet 1   omeprazole  (PRILOSEC) 40 MG capsule Take 1 capsule by mouth once daily 90 capsule 3   ondansetron  (ZOFRAN ) 4 MG tablet TAKE 1 TABLET BY MOUTH EVERY 8 HOURS AS NEEDED FOR NAUSEA FOR VOMITING 30 tablet 0   ondansetron  (ZOFRAN -ODT) 4 MG disintegrating tablet Take 1 tablet (4 mg total) by mouth every 8 (eight) hours as needed for nausea or vomiting. 20 tablet 0   OneTouch Delica Lancets 33G MISC 1 each by Does not apply route daily. 100 each 2   pioglitazone  (ACTOS ) 15 MG tablet Take 1 tablet (15 mg total) by mouth daily. 90 tablet 1   rizatriptan  (MAXALT ) 10 MG tablet Take 1 tablet at onset of migraine. May repeat in 2 hours if needed. No more than 2 in 24 hr period 10 tablet 11   rosuvastatin  (CRESTOR ) 40 MG tablet Take 1 tablet (40 mg total) by mouth at bedtime. 90 tablet 1   sertraline  (ZOLOFT ) 25 MG tablet Take 1 tablet (25 mg total) by mouth daily with breakfast. 30 tablet 1   VENTOLIN  HFA 108 (90 Base) MCG/ACT inhaler INHALE 2 PUFFS BY MOUTH EVERY 6 HOURS AS NEEDED FOR WHEEZING OR SHORTNESS OF BREATH 36 g 0   No current facility-administered medications for this visit.      Musculoskeletal: Strength & Muscle Tone: within normal limits Gait & Station: walks with cane Patient leans: N/A  Psychiatric Specialty Exam: Review of Systems  Psychiatric/Behavioral:  Positive for sleep disturbance. The patient is nervous/anxious.     Blood pressure 127/78, pulse 68, temperature (!) 97.3 F (36.3 C), temperature source Temporal, height 5' 1 (1.549 m), weight 220 lb 12.8 oz (100.2 kg).Body mass index is 41.72 kg/m.  General Appearance: Casual  Eye Contact:  Good  Speech:  Normal Rate  Volume:  Normal  Mood:  Anxious  Affect:  Appropriate  Thought Process:  Goal Directed and Descriptions of Associations: Intact  Orientation:  Full (Time, Place, and Person)  Thought Content: Logical   Suicidal Thoughts:  No  Homicidal Thoughts:  No  Memory:  Immediate;   Fair Recent;   Fair Remote;   Fair  Judgement:  Fair  Insight:  Fair  Psychomotor Activity:  Normal  Concentration:  Concentration: Fair and Attention Span: Fair  Recall:  [employer] of Knowledge: Fair  Language: Fair  Akathisia:  No  Handed:  Right  AIMS (if indicated): not done  Assets:  Communication Skills Desire for Improvement Housing Social Support Transportation  ADL's:  Intact  Cognition: WNL  Sleep:  varies   Screenings: AIMS  Flowsheet Row Office Visit from 12/26/2021 in [facility] Office Visit from 10/24/2021 in [facility]  AIMS Total Score 0 0   GAD-7    Flowsheet Row Office Visit from 02/13/2024 in [facility] Office Visit from 01/21/2024 in [facility] Office Visit from 11/20/2023 in [facility] Office Visit from 08/20/2023 in [facility] Office Visit from 07/18/2023 in [facility]  Total GAD-7 Score 7 9 7 7  0   Mini-Mental    Flowsheet Row  Office Visit from 12/04/2022 in [facility]  Total Score (max 30 points ) 30   PHQ2-9    Flowsheet Row Office Visit from 02/13/2024 in [facility] Office Visit from 01/21/2024 in [facility] Office Visit from 11/20/2023 in [facility] Office Visit from 08/20/2023 in [facility] Office Visit from 07/18/2023 in [facility]  PHQ-2 Total Score 6 2 4 4 2   PHQ-9 Total Score 17 10 9 10 7    Flowsheet Row Office Visit from 02/18/2024 in [facility] Office Visit from 01/21/2024 in [facility] Office Visit from 10/29/2023 in [facility]  C-SSRS RISK CATEGORY No Risk No Risk No Risk     Assessment and Plan: Beverly Barrett is a 63 year old African-American female, married, lives in [place], has a history of MDD, GAD, insomnia, OSA on CPAP was evaluated in office today.  Discussed assessment and plan as noted below.  Insomnia-unstable Continues to have difficulty with sleep.  Lack of sleep hygiene as well as caregiving responsibilities does affect her sleep.  She is currently compliant on CPAP for obstructive sleep apnea which does help.  Agreeable to dosage increase of mirtazapine . Increase mirtazapine  to 22.5 mg at bedtime Add hydroxyzine  10 mg twice a day as needed for anxiety and sleep Ensure sleep hygiene techniques Ensure CPAP use.  GAD-improving Currently does have multiple situational stressors although anxiety improving on the current medication regimen. Increase mirtazapine  as noted above. Add hydroxyzine  10 mg twice a day as needed for anxiety and sleep Continue Zoloft  25 mg daily with breakfast  MDD in partial remission Reports improvement in depression symptoms. Continue Zoloft  25 mg  daily with breakfast.  Follow-up Follow-up in clinic in 6 to 7 weeks or sooner if needed.   Consent: Patient/Guardian gives verbal consent for treatment and assignment of benefits for services provided during this visit. Patient/Guardian expressed understanding and agreed to proceed.  This note was generated in part or whole with voice recognition software. Voice recognition is usually quite accurate but there are transcription errors that can and very often do occur. I apologize for any typographical errors that were not detected and corrected.     Beverly Overbaugh, MD 02/18/2024, 12:02 PM

## 2024-02-20 ENCOUNTER — Encounter: Payer: Self-pay | Admitting: Family Medicine

## 2024-02-20 ENCOUNTER — Ambulatory Visit: Admitting: Family Medicine

## 2024-02-20 ENCOUNTER — Ambulatory Visit
Admission: RE | Admit: 2024-02-20 | Discharge: 2024-02-20 | Disposition: A | Discharge status: Home / Self Care | Source: Ambulatory Visit | Attending: Family Medicine | Admitting: Family Medicine

## 2024-02-20 VITALS — BP 130/70 | HR 89 | Resp 16 | Ht 61.0 in | Wt 220.0 lb

## 2024-02-20 DIAGNOSIS — N816 Rectocele: Secondary | ICD-10-CM | POA: Diagnosis not present

## 2024-02-20 DIAGNOSIS — R300 Dysuria: Secondary | ICD-10-CM | POA: Diagnosis not present

## 2024-02-20 DIAGNOSIS — N952 Postmenopausal atrophic vaginitis: Secondary | ICD-10-CM | POA: Diagnosis not present

## 2024-02-20 DIAGNOSIS — Z1231 Encounter for screening mammogram for malignant neoplasm of breast: Secondary | ICD-10-CM | POA: Insufficient documentation

## 2024-02-20 DIAGNOSIS — R1030 Lower abdominal pain, unspecified: Secondary | ICD-10-CM | POA: Diagnosis not present

## 2024-02-20 NOTE — Progress Notes (Signed)
 Name: Beverly Barrett   MRN: [id number]    DOB: 1960/12/14   Date:02/20/2024       Progress Note  Subjective  Chief Complaint  Chief Complaint  Patient presents with   Follow-up    1 week for abdominal pain. Getting a little better   Dysuria    Discussed the use of AI scribe software for clinical note transcription with the patient, who gave verbal consent to proceed.  History of Present Illness Beverly Barrett is a 63 year old female who presents with persistent lower abdominal pain and new onset burning during urination.  She initially presented with lower abdominal pain and nausea, last week . At that time, she experienced vomiting and the pain was localized to the lower abdomen, primarily on the left side. A CBC and kidney and liver function tests were conducted, all of which returned normal results. She was treated with metronidazole  500 mg and ciprofloxacin , and an abdominal x-ray on July 18th showed no obstruction or fluid accumulation. She had been previously treated for diverticulitis at [facility] in March 2025 and seemed to have been another flare . She is taking last dose of cipro  and flagyl  tonight   Currently, she experiences persistent nausea and new symptoms of burning during urination. She completed the course of antibiotics but has noticed blood when wiping after bowel movements, which she attributes to her known hemorrhoids. No blood in the urine. She also reports lower back pain ( chronic ) and discomfort in the upper abdomen and top of the bladder area. She denies fever but has intermittent chills. Appetite is fine  She has not yet provided a urine sample for further analysis.    Patient Active Problem List   Diagnosis Date Noted   Biliary dyskinesia 08/12/2023   Gastric erythema 08/07/2023   Angina pectoris associated with type 2 diabetes mellitus (HCC) 12/04/2022   Insomnia due to medical condition 12/04/2022   Bereavement 12/04/2022   MDD (major depressive  disorder), recurrent, in full remission (HCC) 12/26/2021   Polyp of colon    Chronic idiopathic constipation 12/03/2021   Asthma, well controlled 12/03/2021   Migraine without aura and without status migrainosus, not intractable 12/03/2021   Memory loss 08/27/2021   MDD (major depressive disorder), recurrent episode, mild (HCC) 07/19/2021   Sleep disorder 07/19/2021   Dyslipidemia associated with type 2 diabetes mellitus (HCC) 03/06/2020   Chronic low back pain 12/31/2018   Grade II internal hemorrhoids 05/16/2017   Mixed stress and urge urinary incontinence 11/12/2016   Degenerative arthritis of lumbar spine 11/12/2016   Common migraine with intractable migraine 09/20/2016   Iron deficiency anemia 09/12/2016   GAD (generalized anxiety disorder) 12/20/2015   OSA on CPAP 12/20/2015   Benign neoplasm of sigmoid colon    Chronic pain of multiple joints 06/14/2014   Arthritis, degenerative 06/14/2014   Morbid obesity with BMI of 40.0-44.9, adult (HCC) 06/14/2014   Multinodular goiter 08/18/2013   Hypertension goal BP (blood pressure) < 140/90 12/07/2010   Familial multiple lipoprotein-type hyperlipidemia 12/07/2010   Allergic rhinitis 10/13/2008    Social History   Tobacco Use   Smoking status: Never    Passive exposure: Never   Smokeless tobacco: Never  Substance Use Topics   Alcohol use: No    Alcohol/week: 0.0 standard drinks of alcohol     Current Outpatient Medications:    acetaminophen  (TYLENOL ) 500 MG tablet, Take 2 tablets (1,000 mg total) by mouth every 6 (six) hours as needed for mild  pain (pain score 1-3)., Disp: , Rfl:    amLODipine  (NORVASC ) 2.5 MG tablet, Take 1 tablet (2.5 mg total) by mouth daily., Disp: 90 tablet, Rfl: 1   aspirin  81 MG EC tablet, Take 81 mg by mouth daily., Disp: , Rfl:    Cholecalciferol (VITAMIN D ) 50 MCG (2000 UT) CAPS, Take 2,000 Units by mouth daily., Disp: , Rfl:    cloNIDine  (CATAPRES ) 0.1 MG tablet, Take 1 tablet (0.1 mg total) by  mouth at bedtime., Disp: 90 tablet, Rfl: 1   dimenhyDRINATE (DRAMAMINE) 50 MG tablet, Take 50 mg by mouth every 8 (eight) hours as needed for nausea., Disp: , Rfl:    fluticasone  (FLONASE ) 50 MCG/ACT nasal spray, Place 2 sprays into both nostrils daily as needed for allergies., Disp: 48 mL, Rfl: 0   Fluticasone  Furoate (ARNUITY ELLIPTA ) 100 MCG/ACT AEPB, Take 1 puff by mouth daily at 12 noon., Disp: 30 each, Rfl: 5   gabapentin  (NEURONTIN ) 400 MG capsule, Take 1 capsule in the morning, take 2 at night, Disp: 270 capsule, Rfl: 1   Galcanezumab -gnlm (EMGALITY ) 120 MG/ML SOAJ, Inject 120 mg into the skin every 30 (thirty) days., Disp: 1.12 mL, Rfl: 11   glucose blood test strip, Use as instructed, Disp: 100 each, Rfl: 12   hydrOXYzine  (ATARAX ) 10 MG tablet, Take 1 tablet (10 mg total) by mouth 2 (two) times daily as needed for anxiety (and sleep)., Disp: 60 tablet, Rfl: 1   Iron-FA-B Cmp-C-Biot-Probiotic (FUSION PLUS) CAPS, Take 1 capsule by mouth once daily, Disp: 30 capsule, Rfl: 0   Lancet Devices (ONE TOUCH DELICA LANCING DEV) MISC, 1 each by Does not apply route daily., Disp: 1 each, Rfl: 0   levothyroxine  (SYNTHROID ) 100 MCG tablet, Take 100 mcg by mouth daily before breakfast., Disp: , Rfl:    linaclotide  (LINZESS ) 145 MCG CAPS capsule, Take 1 capsule (145 mcg total) by mouth daily., Disp: 90 capsule, Rfl: 1   loratadine (CLARITIN) 10 MG tablet, Take 10 mg by mouth daily., Disp: , Rfl:    metFORMIN  (GLUCOPHAGE -XR) 750 MG 24 hr tablet, Take 2 tablets (1,500 mg total) by mouth daily with breakfast., Disp: 180 tablet, Rfl: 1   metoCLOPramide  (REGLAN ) 5 MG tablet, Take 1 tablet (5 mg total) by mouth daily before supper., Disp: 90 tablet, Rfl: 1   metoprolol  succinate (TOPROL -XL) 50 MG 24 hr tablet, TAKE 1 TABLET BY MOUTH ONCE DAILY WITH OR IMMEDIATELY FOLLOWING A MEAL, Disp: 90 tablet, Rfl: 0   mirtazapine  (REMERON ) 15 MG tablet, Take 1.5 tablets (22.5 mg total) by mouth at bedtime., Disp: 45 tablet,  Rfl: 1   montelukast  (SINGULAIR ) 10 MG tablet, Take 1 tablet (10 mg total) by mouth at bedtime., Disp: 90 tablet, Rfl: 1   nitroGLYCERIN  (NITROSTAT ) 0.4 MG SL tablet, Place 1 tablet (0.4 mg total) under the tongue every 5 (five) minutes as needed., Disp: 25 tablet, Rfl: 0   olmesartan -hydrochlorothiazide  (BENICAR  HCT) 40-25 MG tablet, Take 1 tablet by mouth daily., Disp: 90 tablet, Rfl: 1   omeprazole  (PRILOSEC) 40 MG capsule, Take 1 capsule by mouth once daily, Disp: 90 capsule, Rfl: 3   ondansetron  (ZOFRAN ) 4 MG tablet, TAKE 1 TABLET BY MOUTH EVERY 8 HOURS AS NEEDED FOR NAUSEA FOR VOMITING, Disp: 30 tablet, Rfl: 0   ondansetron  (ZOFRAN -ODT) 4 MG disintegrating tablet, Take 1 tablet (4 mg total) by mouth every 8 (eight) hours as needed for nausea or vomiting., Disp: 20 tablet, Rfl: 0   OneTouch Delica Lancets 33G MISC, 1  each by Does not apply route daily., Disp: 100 each, Rfl: 2   pioglitazone  (ACTOS ) 15 MG tablet, Take 1 tablet (15 mg total) by mouth daily., Disp: 90 tablet, Rfl: 1   rizatriptan  (MAXALT ) 10 MG tablet, Take 1 tablet at onset of migraine. May repeat in 2 hours if needed. No more than 2 in 24 hr period, Disp: 10 tablet, Rfl: 11   rosuvastatin  (CRESTOR ) 40 MG tablet, Take 1 tablet (40 mg total) by mouth at bedtime., Disp: 90 tablet, Rfl: 1   sertraline  (ZOLOFT ) 25 MG tablet, Take 1 tablet (25 mg total) by mouth daily with breakfast., Disp: 30 tablet, Rfl: 1   VENTOLIN  HFA 108 (90 Base) MCG/ACT inhaler, INHALE 2 PUFFS BY MOUTH EVERY 6 HOURS AS NEEDED FOR WHEEZING OR SHORTNESS OF BREATH, Disp: 36 g, Rfl: 0   ciprofloxacin  (CIPRO ) 500 MG tablet, Take 1 tablet (500 mg total) by mouth 2 (two) times daily for 7 days., Disp: 14 tablet, Rfl: 0   metroNIDAZOLE  (FLAGYL ) 500 MG tablet, Take 1 tablet (500 mg total) by mouth 2 (two) times daily for 7 days., Disp: 14 tablet, Rfl: 0  Allergies  Allergen Reactions   Latex Swelling    Pt unsure of reaction pt states something happened while in the  hospital after surgery.    ROS  Ten systems reviewed and is negative except as mentioned in HPI    Objective  Vitals:   02/20/24 1435  BP: 130/70  Pulse: 89  Resp: 16  SpO2: 96%  Weight: 220 lb (99.8 kg)  Height: 5' 1 (1.549 m)    Body mass index is 41.57 kg/m.  Physical Exam  CONSTITUTIONAL: Patient appears well-developed and well-nourished. No distress. HEENT: Head atraumatic, normocephalic, neck supple. CARDIOVASCULAR: Normal rate, regular rhythm and normal heart sounds. No murmur heard. No BLE edema. PULMONARY: Effort normal and breath sounds normal. No respiratory distress. ABDOMINAL: Abdomen tender to palpation, mostly on supra pubic area and epigastric area . MUSCULOSKELETAL: Normal gait. Without gross motor or sensory deficit. PSYCHIATRIC: Patient has a normal mood and affect. Behavior is normal. Judgment and thought content normal. GENITOURINARY: No vaginal discharge. Vaginal walls dry, she has rectocele, cervix absent. RECTAL: External hemorrhoids present.  No stool on anal vault   Recent Results (from the past 2160 hours)  HM DIABETES EYE EXAM     Status: None   Collection Time: 01/22/24 12:00 AM  Result Value Ref Range   HM Diabetic Eye Exam No Retinopathy No Retinopathy    Comment: [facility]  Hemoglobin A1c     Status: None   Collection Time: 01/22/24 12:00 AM  Result Value Ref Range   Hemoglobin A1C 6.1     Comment: [facility]  Comprehensive metabolic panel with GFR     Status: None   Collection Time: 02/13/24 12:28 PM  Result Value Ref Range   Sodium 143 135 - 145 mmol/L   Potassium 3.9 3.5 - 5.1 mmol/L   Chloride 103 98 - 111 mmol/L   CO2 28 22 - 32 mmol/L   Glucose, Bld 98 70 - 99 mg/dL    Comment: Glucose reference range applies only to samples taken after fasting for at least 8 hours.   BUN 16 8 - 23 mg/dL   Creatinine, Ser 9.34 0.44 - 1.00 mg/dL   Calcium  9.4 8.9 - 10.3 mg/dL   Total Protein 6.9 6.5 - 8.1 g/dL   Albumin 3.8 3.5 - 5.0 g/dL   AST 18 15 -  41 U/L  ALT 13 0 - 44 U/L   Alkaline Phosphatase 43 38 - 126 U/L   Total Bilirubin 0.6 0.0 - 1.2 mg/dL   GFR, Estimated >39 >39 mL/min    Comment: (NOTE) Calculated using the CKD-EPI Creatinine Equation (2021)    Anion gap 12 5 - 15    Comment: Performed at [facility], [address] [place], [place] [address]  CBC with Differential/Platelet     Status: Abnormal   Collection Time: 02/13/24 12:28 PM  Result Value Ref Range   WBC 9.1 4.0 - 10.5 K/uL   RBC 4.16 3.87 - 5.11 MIL/uL   Hemoglobin 12.1 12.0 - 15.0 g/dL   HCT 64.2 (L) 63.9 - 53.9 %   MCV 85.8 80.0 - 100.0 fL   MCH 29.1 26.0 - 34.0 pg   MCHC 33.9 30.0 - 36.0 g/dL   RDW 85.0 88.4 - 84.4 %   Platelets 338 150 - 400 K/uL   nRBC 0.0 0.0 - 0.2 %   Neutrophils Relative % 56 %   Neutro Abs 5.0 1.7 - 7.7 K/uL   Lymphocytes Relative 33 %   Lymphs Abs 3.0 0.7 - 4.0 K/uL   Monocytes Relative 9 %   Monocytes Absolute 0.9 0.1 - 1.0 K/uL   Eosinophils Relative 1 %   Eosinophils Absolute 0.1 0.0 - 0.5 K/uL   Basophils Relative 1 %   Basophils Absolute 0.1 0.0 - 0.1 K/uL   Immature Granulocytes 0 %   Abs Immature Granulocytes 0.02 0.00 - 0.07 K/uL    Comment: Performed at [facility], [address] [place], [place] [address]      Assessment & Plan Lower abdominal pain and dysuria Persistent lower abdominal pain with new dysuria and mild hematuria. Differential includes UTI or residual effects from diverticulitis. - Obtain urine sample for urinalysis and culture ( she was not able to leave specimen) - Try Pyridium  ( otc AZO )for urinary discomfort. - Consider CT scan if symptoms persist and urinalysis is negative.  Recent diverticulitis, status post antibiotics Recent diverticulitis treated with metronidazole  and ciprofloxacin . Symptoms improved, but not resolved, advised to call back next week if still having symptoms for CT abdomen / pelvis  Postmenopausal vaginal dryness Vaginal dryness likely  contributing to discomfort. - Consider prescribing estrogen cream if symptoms persist. - Advise use of A&D ointment for external irritation.  Rectocele Rectocele noted. Explained rectocele involves weakness in the wall between rectum and vagina.  External hemorrhoids External hemorrhoids present.

## 2024-02-24 LAB — URINE CULTURE
MICRO NUMBER:: 16754010
Result:: NO GROWTH
SPECIMEN QUALITY:: ADEQUATE

## 2024-02-25 ENCOUNTER — Ambulatory Visit: Payer: Self-pay | Admitting: Family Medicine

## 2024-02-25 ENCOUNTER — Other Ambulatory Visit: Payer: Self-pay | Admitting: Family Medicine

## 2024-02-25 DIAGNOSIS — J4540 Moderate persistent asthma, uncomplicated: Secondary | ICD-10-CM

## 2024-02-25 DIAGNOSIS — E1169 Type 2 diabetes mellitus with other specified complication: Secondary | ICD-10-CM

## 2024-03-01 ENCOUNTER — Other Ambulatory Visit: Payer: Self-pay | Admitting: Family Medicine

## 2024-03-01 DIAGNOSIS — J4540 Moderate persistent asthma, uncomplicated: Secondary | ICD-10-CM

## 2024-03-01 DIAGNOSIS — E1169 Type 2 diabetes mellitus with other specified complication: Secondary | ICD-10-CM

## 2024-03-01 NOTE — Telephone Encounter (Unsigned)
 Copied from CRM [id number]. Topic: Clinical - Medication Refill >> Mar 01, 2024 10:48 AM Everette C wrote: Medication: pioglitazone  (ACTOS ) 15 MG tablet [[id number]]  Has the patient contacted their pharmacy? Yes (Agent: If no, request that the patient contact the pharmacy for the refill. If patient does not wish to contact the pharmacy document the reason why and proceed with request.) (Agent: If yes, when and what did the pharmacy advise?)  This is the patient's preferred pharmacy:  [facility] [address] ([address]), [place] - [address] [place]) [place] [address] Phone: [phone] Fax: [phone]  [facility] - [place], [place] - [address] [place] [address] Phone: [phone] Fax: [phone]  Is this the correct pharmacy for this prescription? Yes If no, delete pharmacy and type the correct one.   Has the prescription been filled recently? Yes  Is the patient out of the medication? Yes  Has the patient been seen for an appointment in the last year OR does the patient have an upcoming appointment? Yes  Can we respond through MyChart? No  Agent: Please be advised that Rx refills may take up to 3 business days. We ask that you follow-up with your pharmacy.

## 2024-03-02 NOTE — Telephone Encounter (Signed)
 Requested medication (s) are due for refill today: no  Requested medication (s) are on the active medication list: yes  Last refill:  01/20/24 #90 1 RF  Future visit scheduled: yes  Notes to clinic:  PAS listed 2 pharmacies. Called the one the that the original order was sent to. Mail order stated no deliveries have been sent. Called pt but unable to LM on VM. Not sure which to send it to.    Requested Prescriptions  Pending Prescriptions Disp Refills   pioglitazone  (ACTOS ) 15 MG tablet 90 tablet 1    Sig: Take 1 tablet (15 mg total) by mouth daily.     Endocrinology:  Diabetes - Glitazones - pioglitazone  Passed - 03/02/2024  2:26 PM      Passed - HBA1C is between 0 and 7.9 and within 180 days    Hemoglobin A1C  Date Value Ref Range Status  01/22/2024 6.1  Final    Comment:    [facility]         Passed - Valid encounter within last 6 months    Recent Outpatient Visits           1 week ago Dysuria   [facility] Glenard Mire, MD   2 weeks ago Abdominal pain, LLQ   [facility] [place], Mire, MD   3 months ago Dyslipidemia associated with type 2 diabetes mellitus [facility])   [facility] Sowles, Krichna, MD   5 months ago Vaginal burning   [facility] Gareth Mliss FALCON, FNP       Future Appointments             In 2 months Sowles, Krichna, MD [facility], [facility]

## 2024-03-05 ENCOUNTER — Other Ambulatory Visit: Payer: Self-pay | Admitting: Family Medicine

## 2024-03-05 DIAGNOSIS — J4540 Moderate persistent asthma, uncomplicated: Secondary | ICD-10-CM

## 2024-03-05 DIAGNOSIS — E1159 Type 2 diabetes mellitus with other circulatory complications: Secondary | ICD-10-CM

## 2024-03-10 ENCOUNTER — Other Ambulatory Visit: Payer: Self-pay | Admitting: Pharmacist

## 2024-03-10 ENCOUNTER — Other Ambulatory Visit: Payer: Self-pay

## 2024-03-10 DIAGNOSIS — E119 Type 2 diabetes mellitus without complications: Secondary | ICD-10-CM

## 2024-03-10 DIAGNOSIS — I10 Essential (primary) hypertension: Secondary | ICD-10-CM

## 2024-03-10 DIAGNOSIS — G4733 Obstructive sleep apnea (adult) (pediatric): Secondary | ICD-10-CM

## 2024-03-10 NOTE — Patient Instructions (Signed)
 Goals Addressed             This Visit's Progress    Pharmacy Goals       Please be sure to use your Arnuity Ellipta  inhaler everyday to control your symptoms. Please remember to rinse your mouth thoroughly with water  and spit out after each use.   Check your blood pressure twice weekly, and any time you have concerning symptoms like headache, chest pain, dizziness, shortness of breath, or vision changes.   Our goal is less than 130/80.  To appropriately check your blood pressure, make sure you do the following:  1) Avoid caffeine, exercise, or tobacco products for 30 minutes before checking. Empty your bladder. 2) Sit with your back supported in a flat-backed chair. Rest your arm on something flat (arm of the chair, table, etc). 3) Sit still with your feet flat on the floor, resting, for at least 5 minutes.  4) Check your blood pressure. Take 1-2 readings.  5) Write down these readings and bring with you to any provider appointments.  Bring your home blood pressure machine with you to a provider's office for accuracy comparison at least once a year.   Make sure you take your blood pressure medications before you come to any office visit, even if you were asked to fast for labs.  Please let me know if your have any further medication questions or concerns!    Sharyle Sia, PharmD, [facility] [phone]

## 2024-03-10 NOTE — Progress Notes (Signed)
 03/10/2024 Name: Beverly Barrett MRN: [id number] DOB: 06-07-1961  Chief Complaint  Patient presents with   Medication Management    Beverly Barrett is a 63 y.o. year old female who presented for a telephone visit.   They were referred to the pharmacist by their PCP for assistance in managing medication access and medication adherence.    Subjective:   Care Team: Primary Care Provider: Sowles, Krichna, MD; Next Scheduled Visit: 05/25/2024 GI Specialist: Beverly Corinn Skiff, MD Neurologist: Gayland Lauraine PARAS, NP Endocrinologist: Cherilyn Debby Quivers, MD; Next Scheduled Visit: 08/03/2024 Psychiatrist: Eappen, Saramma, MD; Next Scheduled Visit: 04/21/2024    Medication Access/Adherence  Current Pharmacy:  [facility] [address]), [place] - [address] [place] ([place]) [place] [address] Phone: [phone] Fax: [phone]  [facility] - [place], [place] - [address] [place] [address] Phone: [phone] Fax: [phone]   Patient reports affordability concerns with their medications: No  Patient reports access/transportation concerns to their pharmacy: No  Patient reports adherence concerns with their medications:  No     Uses weekly pillbox with support of daughter/husband.   Reports continuing to have GI concerns. Having nausea today. Planning to schedule an appointment with Dr. Unk now that provider is established with [facility]   Asthma:   Current medications:  - montelukast  10 mg nightly - Arnuity Ellipta  100 mcg/act - 1 puff daily             Confirms rinsing mouth and spitting out after each use - Albuterol  HFA - 2 puffs every 6 hours as needed for wheezing or shortness of breath   Medications tried in the past: Flovent  HFA (cost); Qvar (did not work well for patient)   Reports rarely needing albuterol  inhaler as breathing well controlled      Hypertension:   Current medications:  - amlodipine  2.5 mg daily - clonidine  0.1 mg nightly at bedtime - metoprolol  ER 50 mg daily - olmesartan -HCTZ 40-25 mg daily   Reports has been monitoring her home BP and last checked on Sunday, reading:120/80   Denies adding salt to her food     Diabetes:   Current medications:  - metformin  ER 750 mg -2 tablets daily with breakfast - pioglitazone  15 mg daily   Current glucose readings: last checked this morning, reading: 101   Patient denies hypoglycemic s/sx including dizziness, shakiness, sweating.      Objective:  Lab Results  Component Value Date   HGBA1C 6.1 01/22/2024    Lab Results  Component Value Date   CREATININE 0.65 02/13/2024   BUN 16 02/13/2024   NA 143 02/13/2024   K 3.9 02/13/2024   CL 103 02/13/2024   CO2 28 02/13/2024    Lab Results  Component Value Date   CHOL 136 04/04/2023   HDL 57 04/04/2023   LDLCALC 57 04/04/2023   TRIG 136 04/04/2023   CHOLHDL 2.4 04/04/2023   BP Readings from Last 3 Encounters:  02/20/24 130/70  02/18/24 127/78  02/13/24 114/76   Pulse Readings from Last 3 Encounters:  02/20/24 89  02/18/24 68  02/13/24 69    Current Outpatient Medications on File Prior to Visit  Medication Sig Dispense Refill   amLODipine  (NORVASC ) 2.5 MG tablet Take 1 tablet (2.5 mg total) by mouth daily. 90 tablet 1   cloNIDine  (CATAPRES ) 0.1 MG tablet Take 1 tablet (0.1 mg total) by mouth at bedtime. 90 tablet  1   metFORMIN  (GLUCOPHAGE -XR) 750 MG 24 hr tablet Take 2 tablets (1,500 mg total) by mouth daily with breakfast. 180 tablet 1   metoprolol  succinate (TOPROL -XL) 50 MG 24 hr tablet TAKE 1 TABLET BY MOUTH ONCE DAILY WITH OR IMMEDIATELY FOLLOWING A MEAL 90 tablet 0   olmesartan -hydrochlorothiazide  (BENICAR  HCT) 40-25 MG tablet Take 1 tablet by mouth daily. 90 tablet 1   pioglitazone  (ACTOS ) 15 MG tablet Take 1 tablet (15 mg total) by mouth daily. 90 tablet 1   acetaminophen  (TYLENOL ) 500 MG  tablet Take 2 tablets (1,000 mg total) by mouth every 6 (six) hours as needed for mild pain (pain score 1-3).     aspirin  81 MG EC tablet Take 81 mg by mouth daily.     Cholecalciferol (VITAMIN D ) 50 MCG (2000 UT) CAPS Take 2,000 Units by mouth daily.     dimenhyDRINATE (DRAMAMINE) 50 MG tablet Take 50 mg by mouth every 8 (eight) hours as needed for nausea.     fluticasone  (FLONASE ) 50 MCG/ACT nasal spray Place 2 sprays into both nostrils daily as needed for allergies. 48 mL 0   Fluticasone  Furoate (ARNUITY ELLIPTA ) 100 MCG/ACT AEPB Take 1 puff by mouth daily at 12 noon. 30 each 5   gabapentin  (NEURONTIN ) 400 MG capsule Take 1 capsule in the morning, take 2 at night 270 capsule 1   Galcanezumab -gnlm (EMGALITY ) 120 MG/ML SOAJ Inject 120 mg into the skin every 30 (thirty) days. 1.12 mL 11   glucose blood test strip Use as instructed 100 each 12   hydrOXYzine  (ATARAX ) 10 MG tablet Take 1 tablet (10 mg total) by mouth 2 (two) times daily as needed for anxiety (and sleep). 60 tablet 1   Iron-FA-B Cmp-C-Biot-Probiotic (FUSION PLUS) CAPS Take 1 capsule by mouth once daily 30 capsule 0   Lancet Devices (ONE TOUCH DELICA LANCING DEV) MISC 1 each by Does not apply route daily. 1 each 0   levothyroxine  (SYNTHROID ) 100 MCG tablet Take 100 mcg by mouth daily before breakfast.     linaclotide  (LINZESS ) 145 MCG CAPS capsule Take 1 capsule (145 mcg total) by mouth daily. 90 capsule 1   loratadine (CLARITIN) 10 MG tablet Take 10 mg by mouth daily.     metoCLOPramide  (REGLAN ) 5 MG tablet Take 1 tablet (5 mg total) by mouth daily before supper. 90 tablet 1   mirtazapine  (REMERON ) 15 MG tablet Take 1.5 tablets (22.5 mg total) by mouth at bedtime. 45 tablet 1   montelukast  (SINGULAIR ) 10 MG tablet Take 1 tablet (10 mg total) by mouth at bedtime. 90 tablet 1   nitroGLYCERIN  (NITROSTAT ) 0.4 MG SL tablet Place 1 tablet (0.4 mg total) under the tongue every 5 (five) minutes as needed. 25 tablet 0   omeprazole  (PRILOSEC)  40 MG capsule Take 1 capsule by mouth once daily 90 capsule 3   ondansetron  (ZOFRAN ) 4 MG tablet TAKE 1 TABLET BY MOUTH EVERY 8 HOURS AS NEEDED FOR NAUSEA FOR VOMITING 30 tablet 0   ondansetron  (ZOFRAN -ODT) 4 MG disintegrating tablet Take 1 tablet (4 mg total) by mouth every 8 (eight) hours as needed for nausea or vomiting. 20 tablet 0   OneTouch Delica Lancets 33G MISC 1 each by Does not apply route daily. 100 each 2   rizatriptan  (MAXALT ) 10 MG tablet Take 1 tablet at onset of migraine. May repeat in 2 hours if needed. No more than 2 in 24 hr period 10 tablet 11   rosuvastatin  (CRESTOR ) 40 MG tablet Take  1 tablet (40 mg total) by mouth at bedtime. 90 tablet 1   sertraline (ZOLOFT) 25 MG tablet Take 1 tablet (25 mg total) by mouth daily with breakfast. 30 tablet 1   VENTOLIN HFA 108 (90 Base) MCG/ACT inhaler INHALE 2 PUFFS BY MOUTH EVERY 6 HOURS AS NEEDED FOR WHEEZING OR SHORTNESS OF BREATH 36 g 0   No current facility-administered medications on file prior to visit.       Assessment/Plan:   Plans to contact [facility] to schedule follow up appointment with GI Specialist - Will send message to let PCP know/ask about referral  Patient to contact pharmacy for refills, including of Arnuity inhaler  Patient shares that she is having trouble with obtaining her CPAP supplies (no longer able to order from previous supplier). Shares that she discussed with PCP, but is not sure of next steps - Will send message to PCP to follow up   Asthma: - Reviewed appropriate inhaler technique. Encourage patient to continue to use maintenance inhaler consistently     Hypertension: - Reviewed appropriate blood pressure monitoring technique and reviewed goal blood pressure. Recommended to check home blood pressure and heart rate, keep log of results and have this record to review at upcoming medical appointments. Patient to contact provider office sooner if needed for readings outside of established  parameters or symptoms   Diabetes: - Currently controlled - Recommend to check glucose, keep log of results and have this record to review at upcoming medical appointments. Patient to contact provider office sooner if needed for readings outside of established parameters or symptoms - Encourage patient to schedule annual eye exam     Follow Up Plan: Schedule follow up for patient to speak with Clinical Pharmacist Peyton Ferries by telephone on 06/21/2024 at 2:00 PM         Sharyle Sia, PharmD, [facility] [phone]

## 2024-03-14 ENCOUNTER — Other Ambulatory Visit: Payer: Self-pay | Admitting: Family Medicine

## 2024-03-14 DIAGNOSIS — R1032 Left lower quadrant pain: Secondary | ICD-10-CM

## 2024-03-27 ENCOUNTER — Other Ambulatory Visit: Payer: Self-pay | Admitting: Family Medicine

## 2024-03-27 ENCOUNTER — Other Ambulatory Visit: Payer: Self-pay | Admitting: Psychiatry

## 2024-03-27 DIAGNOSIS — J4540 Moderate persistent asthma, uncomplicated: Secondary | ICD-10-CM

## 2024-03-27 DIAGNOSIS — F411 Generalized anxiety disorder: Secondary | ICD-10-CM

## 2024-03-27 DIAGNOSIS — F330 Major depressive disorder, recurrent, mild: Secondary | ICD-10-CM

## 2024-03-27 DIAGNOSIS — E785 Hyperlipidemia, unspecified: Secondary | ICD-10-CM

## 2024-03-27 DIAGNOSIS — I152 Hypertension secondary to endocrine disorders: Secondary | ICD-10-CM

## 2024-04-21 ENCOUNTER — Ambulatory Visit: Admitting: Psychiatry

## 2024-04-30 ENCOUNTER — Other Ambulatory Visit: Payer: Self-pay | Admitting: Psychiatry

## 2024-04-30 ENCOUNTER — Other Ambulatory Visit: Payer: Self-pay | Admitting: Family Medicine

## 2024-04-30 DIAGNOSIS — F411 Generalized anxiety disorder: Secondary | ICD-10-CM

## 2024-04-30 DIAGNOSIS — F330 Major depressive disorder, recurrent, mild: Secondary | ICD-10-CM

## 2024-04-30 DIAGNOSIS — J4540 Moderate persistent asthma, uncomplicated: Secondary | ICD-10-CM

## 2024-04-30 DIAGNOSIS — E1169 Type 2 diabetes mellitus with other specified complication: Secondary | ICD-10-CM

## 2024-04-30 NOTE — Telephone Encounter (Signed)
 Copied from CRM #[id number]. Topic: Clinical - Medication Refill >> Apr 30, 2024 12:45 PM Donna E wrote: Medication:  pioglitazone  (ACTOS ) 15 MG tablet  Has the patient contacted their pharmacy? Yes Pharmacy said there are no more refills and to call provider   This is the patient's preferred pharmacy:   [facility] [address] ([address]), [place] - [address] [place]) [place] [address] Phone: [phone] Fax: [phone]   Is this the correct pharmacy for this prescription? Yes If no, delete pharmacy and type the correct one.   Has the prescription been filled recently? Yes  Is the patient out of the medication? Yes  Has the patient been seen for an appointment in the last year OR does the patient have an upcoming appointment? Yes  Can we respond through MyChart? Yes  Agent: Please be advised that Rx refills may take up to 3 business days. We ask that you follow-up with your pharmacy.

## 2024-05-05 ENCOUNTER — Encounter: Payer: Self-pay | Admitting: Psychiatry

## 2024-05-05 ENCOUNTER — Other Ambulatory Visit: Payer: Self-pay

## 2024-05-05 ENCOUNTER — Ambulatory Visit (INDEPENDENT_AMBULATORY_CARE_PROVIDER_SITE_OTHER): Admitting: Psychiatry

## 2024-05-05 VITALS — BP 111/78 | HR 73 | Temp 97.3°F | Ht 61.0 in | Wt 215.8 lb

## 2024-05-05 DIAGNOSIS — F3342 Major depressive disorder, recurrent, in full remission: Secondary | ICD-10-CM

## 2024-05-05 DIAGNOSIS — G4701 Insomnia due to medical condition: Secondary | ICD-10-CM | POA: Diagnosis not present

## 2024-05-05 DIAGNOSIS — F411 Generalized anxiety disorder: Secondary | ICD-10-CM | POA: Diagnosis not present

## 2024-05-05 MED ORDER — SERTRALINE HCL 50 MG PO TABS: 50.0000 mg | ORAL_TABLET | Freq: Every day | ORAL | 0 refills | Status: DC

## 2024-05-05 MED ORDER — MIRTAZAPINE 15 MG PO TABS: 22.5000 mg | ORAL_TABLET | Freq: Every day | ORAL | 1 refills | Status: AC

## 2024-05-05 MED ORDER — HYDROXYZINE HCL 10 MG PO TABS: 10.0000 mg | ORAL_TABLET | Freq: Two times a day (BID) | ORAL | 1 refills | Status: AC | PRN

## 2024-05-05 NOTE — Progress Notes (Unsigned)
 BH MD  OP Progress Note  05/05/2024 3:52 PM Beverly Barrett  MRN:  [id number]  Chief Complaint:  Chief Complaint  Patient presents with   Follow-up   Depression   Anxiety   Medication Refill   HPI: *** Visit Diagnosis:    ICD-10-CM   1. Recurrent major depressive disorder, in full remission  F33.42     2. GAD (generalized anxiety disorder)  F41.1     3. Insomnia due to medical condition  G47.01    mood, insomnia, OSA on CPAP      Past Psychiatric History: ***  Past Medical History:  Past Medical History:  Diagnosis Date   Anginal pain    none in approx 2 yrs   Anxiety    Asthma    Benign neoplasm of sigmoid colon    Chronic constipation    Chronic lower back pain    Common migraine with intractable migraine 09/20/2016   Depression    suicidal ideation   GERD (gastroesophageal reflux disease)    Headache    migraines -3x/wk   Hyperlipidemia    Hypertension    Hypothyroidism    Iron deficiency anemia    Morbid obesity with BMI of 40.0-44.9, adult (HCC)    Motion sickness    cars   Multilevel degenerative disc disease    Multinodular goiter    Shortness of breath dyspnea    Sleep apnea    CPAP   Type 2 diabetes mellitus with diabetic polyneuropathy, without long-term current use of insulin  (HCC)    Vertigo    Vitamin D  deficiency    Wears dentures    partial upper    Past Surgical History:  Procedure Laterality Date   ABDOMINAL HYSTERECTOMY  1996   BIOPSY  08/07/2023   Procedure: BIOPSY;  Surgeon: Unk Corinn Skiff, MD;  Location: [facility] END[facility];  Service: Gastroenterology;;   BLADDER SUSPENSION  1997   CESAREAN SECTION  1995   CHOLECYSTECTOMY     COLONOSCOPY WITH PROPOFOL  N/A 05/29/2015   Procedure: COLONOSCOPY WITH PROPOFOL ;  Surgeon: Rogelia Copping, MD;  Location: [facility];  Service: Endoscopy;  Laterality: N/A;  Latex allergy sleep apnea - no CPAP machine (yet)   COLONOSCOPY WITH PROPOFOL  N/A 12/18/2021   Procedure: COLONOSCOPY WITH  PROPOFOL ;  Surgeon: Unk Corinn Skiff, MD;  Location: [facility];  Service: Gastroenterology;  Laterality: N/A;   COLONOSCOPY WITH PROPOFOL  N/A 08/07/2023   Procedure: COLONOSCOPY WITH PROPOFOL ;  Surgeon: Unk Corinn Skiff, MD;  Location: [facility];  Service: Gastroenterology;  Laterality: N/A;   ECTOPIC PREGNANCY SURGERY     ESOPHAGOGASTRODUODENOSCOPY N/A 04/23/2017   Procedure: ESOPHAGOGASTRODUODENOSCOPY (EGD);  Surgeon: Unk Corinn Skiff, MD;  Location: [facility];  Service: Gastroenterology;  Laterality: N/A;  Latex allergy sleep apnea   ESOPHAGOGASTRODUODENOSCOPY (EGD) WITH PROPOFOL  N/A 08/07/2023   Procedure: ESOPHAGOGASTRODUODENOSCOPY (EGD) WITH PROPOFOL ;  Surgeon: Unk Corinn Skiff, MD;  Location: [facility];  Service: Gastroenterology;  Laterality: N/A;   POLYPECTOMY  05/29/2015   Procedure: POLYPECTOMY;  Surgeon: Rogelia Copping, MD;  Location[facility];  Service: Endoscopy;;   POLYPECTOMY  08/07/2023   Procedure: POLYPECTOMY;  Surgeon: Unk Corinn Skiff, MD;  Location: [facility];  Service: Gastroenterology;;    Family Psychiatric History: ***  Family History:  Family History  Problem Relation Age of Onset   Diabetes Sister    Anxiety disorder Sister    Depression Sister    Hypertension Sister    Cirrhosis Mother    Diabetes Mother  Cirrhosis Father    Breast cancer Sister 38   Heart attack Brother    Alcohol abuse Brother    Drug abuse Brother    Prostate cancer Neg Hx    Kidney cancer Neg Hx    Bladder Cancer Neg Hx     Social History:  Social History   Socioeconomic History   Marital status: Married    Spouse name: Training and development officer   Number of children: 3   Years of education: Not on file   Highest education level: 12th grade  Occupational History   Not on file  Tobacco Use   Smoking status: Never    Passive exposure: Never   Smokeless tobacco: Never  Vaping Use   Vaping status: Never Used  Substance and Sexual  Activity   Alcohol use: No    Alcohol/week: 0.0 standard drinks of alcohol   Drug use: No   Sexual activity: Not Currently    Partners: Male  Other Topics Concern   Not on file  Social History Narrative   Lives with husband and one daughter    She does not work, husband on disability    Social Drivers of Health   Financial Resource Strain: Medium Risk (02/12/2024)   Overall Financial Resource Strain (CARDIA)    Difficulty of Paying Living Expenses: Somewhat hard  Food Insecurity: Food Insecurity Present (02/12/2024)   Hunger Vital Sign    Worried About Running Out of Food in the Last Year: Sometimes true    Ran Out of Food in the Last Year: Patient declined  Transportation Needs: No Transportation Needs (02/12/2024)   PRAPARE - Administrator, Civil Service (Medical): No    Lack of Transportation (Non-Medical): No  Physical Activity: Insufficiently Active (02/12/2024)   Exercise Vital Sign    Days of Exercise per Week: 3 days    Minutes of Exercise per Session: 20 min  Stress: Stress Concern Present (02/12/2024)   [employer] of Occupational Health - Occupational Stress Questionnaire    Feeling of Stress: Rather much  Social Connections: Moderately Isolated (02/12/2024)   Social Connection and Isolation Panel    Frequency of Communication with Friends and Family: More than three times a week    Frequency of Social Gatherings with Friends and Family: Never    Attends Religious Services: Patient declined    Database administrator or Organizations: No    Attends Engineer, structural: Not on file    Marital Status: Married    Allergies:  Allergies  Allergen Reactions   Latex Swelling    Pt unsure of reaction pt states something happened while in the hospital after surgery.    Metabolic Disorder Labs: Lab Results  Component Value Date   HGBA1C 6.1 01/22/2024   MPG 148 08/06/2021   MPG 162.81 08/28/2019   No results found for: PROLACTIN Lab  Results  Component Value Date   CHOL 136 04/04/2023   TRIG 136 04/04/2023   HDL 57 04/04/2023   CHOLHDL 2.4 04/04/2023   VLDL 44 (H) 02/12/2017   LDLCALC 57 04/04/2023   LDLCALC 63 04/03/2022   Lab Results  Component Value Date   TSH 0.48 03/04/2022   TSH 0.01 (L) 12/03/2021    Therapeutic Level Labs: No results found for: LITHIUM No results found for: VALPROATE No results found for: CBMZ  Current Medications: Current Outpatient Medications  Medication Sig Dispense Refill   acetaminophen  (TYLENOL ) 500 MG tablet Take 2 tablets (1,000 mg total) by  mouth every 6 (six) hours as needed for mild pain (pain score 1-3).     amLODipine  (NORVASC ) 2.5 MG tablet Take 1 tablet (2.5 mg total) by mouth daily. 90 tablet 1   aspirin  81 MG EC tablet Take 81 mg by mouth daily.     Cholecalciferol (VITAMIN D ) 50 MCG (2000 UT) CAPS Take 2,000 Units by mouth daily.     cloNIDine  (CATAPRES ) 0.1 MG tablet Take 1 tablet (0.1 mg total) by mouth at bedtime. 90 tablet 1   dimenhyDRINATE (DRAMAMINE) 50 MG tablet Take 50 mg by mouth every 8 (eight) hours as needed for nausea.     fluticasone  (FLONASE ) 50 MCG/ACT nasal spray Place 2 sprays into both nostrils daily as needed for allergies. 48 mL 0   Fluticasone  Furoate (ARNUITY ELLIPTA ) 100 MCG/ACT AEPB Take 1 puff by mouth daily at 12 noon. 30 each 5   gabapentin  (NEURONTIN ) 400 MG capsule Take 1 capsule in the morning, take 2 at night 270 capsule 1   Galcanezumab -gnlm (EMGALITY ) 120 MG/ML SOAJ Inject 120 mg into the skin every 30 (thirty) days. 1.12 mL 11   glucose blood test strip Use as instructed 100 each 12   hydrOXYzine  (ATARAX ) 10 MG tablet Take 1 tablet (10 mg total) by mouth 2 (two) times daily as needed for anxiety (and sleep). 60 tablet 1   Iron-FA-B Cmp-C-Biot-Probiotic (FUSION PLUS) CAPS Take 1 capsule by mouth once daily 30 capsule 0   Lancet Devices (ONE TOUCH DELICA LANCING DEV) MISC 1 each by Does not apply route daily. 1 each 0    levothyroxine  (SYNTHROID ) 100 MCG tablet Take 100 mcg by mouth daily before breakfast.     linaclotide  (LINZESS ) 145 MCG CAPS capsule Take 1 capsule (145 mcg total) by mouth daily. 90 capsule 1   loratadine (CLARITIN) 10 MG tablet Take 10 mg by mouth daily.     metFORMIN  (GLUCOPHAGE -XR) 750 MG 24 hr tablet Take 2 tablets (1,500 mg total) by mouth daily with breakfast. 180 tablet 1   metoCLOPramide  (REGLAN ) 5 MG tablet Take 1 tablet (5 mg total) by mouth daily before supper. 90 tablet 1   metoprolol  succinate (TOPROL -XL) 50 MG 24 hr tablet TAKE 1 TABLET BY MOUTH ONCE DAILY WITH OR IMMEDIATELY FOLLOWING A MEAL 90 tablet 0   mirtazapine  (REMERON ) 15 MG tablet Take 1.5 tablets (22.5 mg total) by mouth at bedtime. 45 tablet 1   montelukast  (SINGULAIR ) 10 MG tablet Take 1 tablet (10 mg total) by mouth at bedtime. 90 tablet 1   nitroGLYCERIN  (NITROSTAT ) 0.4 MG SL tablet Place 1 tablet (0.4 mg total) under the tongue every 5 (five) minutes as needed. 25 tablet 0   olmesartan -hydrochlorothiazide  (BENICAR  HCT) 40-25 MG tablet Take 1 tablet by mouth daily. 90 tablet 1   omeprazole  (PRILOSEC) 40 MG capsule Take 1 capsule by mouth once daily 90 capsule 3   ondansetron  (ZOFRAN -ODT) 4 MG disintegrating tablet DISSOLVE 1 TABLET IN MOUTH EVERY 8 HOURS AS NEEDED FOR NAUSEA OR VOMITING 20 tablet 0   OneTouch Delica Lancets 33G MISC 1 each by Does not apply route daily. 100 each 2   pioglitazone  (ACTOS ) 15 MG tablet Take 1 tablet (15 mg total) by mouth daily. 90 tablet 1   rizatriptan  (MAXALT ) 10 MG tablet Take 1 tablet at onset of migraine. May repeat in 2 hours if needed. No more than 2 in 24 hr period 10 tablet 11   rosuvastatin  (CRESTOR ) 40 MG tablet Take 1 tablet (40 mg total)  by mouth at bedtime. 90 tablet 1   sertraline  (ZOLOFT ) 25 MG tablet Take 1 tablet by mouth once daily with breakfast 30 tablet 0   VENTOLIN  HFA 108 (90 Base) MCG/ACT inhaler INHALE 2 PUFFS BY MOUTH EVERY 6 HOURS AS NEEDED FOR WHEEZING OR  SHORTNESS OF BREATH 36 g 0   No current facility-administered medications for this visit.     Musculoskeletal: Strength & Muscle Tone: within normal limits Gait & Station: walks with cane Patient leans: N/A  Psychiatric Specialty Exam: Review of Systems  Psychiatric/Behavioral:  The patient is nervous/anxious.     Blood pressure 111/78, pulse 73, temperature (!) 97.3 F (36.3 C), temperature source Temporal, height 5' 1 (1.549 m), weight 215 lb 12.8 oz (97.9 kg).Body mass index is 40.78 kg/m.  General Appearance: Casual  Eye Contact:  Fair  Speech:  Clear and Coherent  Volume:  Normal  Mood:  Anxious  Affect:  Congruent  Thought Process:  Goal Directed and Descriptions of Associations: Intact  Orientation:  Full (Time, Place, and Person)  Thought Content: Logical   Suicidal Thoughts:  No  Homicidal Thoughts:  No  Memory:  Immediate;   Fair Recent;   Fair Remote;   Fair  Judgement:  Fair  Insight:  Fair  Psychomotor Activity:  Normal  Concentration:  Concentration: Fair and Attention Span: Fair  Recall:  [employer] of Knowledge: Fair  Language: Fair  Akathisia:  No  Handed:  Right  AIMS (if indicated): not done  Assets:  Communication Skills Desire for Improvement Housing Resilience Social Support Talents/Skills Transportation  ADL's:  Intact  Cognition: WNL  Sleep:  Fair   Screenings: Midwife Visit from 12/26/2021 in [facility] Office Visit from 10/24/2021 in [facility]  AIMS Total Score 0 0   GAD-7    Flowsheet Row Office Visit from 02/13/2024 in [facility] Office Visit from 01/21/2024 in [facility] Office Visit from 11/20/2023 in [facility] Office Visit from 08/20/2023 in [facility] Office Visit from 07/18/2023 in [facility]  Total GAD-7 Score 7 9 7 7  0   Mini-Mental    Flowsheet Row Office Visit from 12/04/2022 in [facility]  Total Score (max 30 points ) 30   PHQ2-9    Flowsheet Row Office Visit from 02/13/2024 in [facility] Office Visit from 01/21/2024 in [facility] Office Visit from 11/20/2023 in [facility] Office Visit from 08/20/2023 in [facility] Office Visit from 07/18/2023 in [facility]  PHQ-2 Total Score 6 2 4 4 2   PHQ-9 Total Score 17 10 9 10 7    Flowsheet Row Office Visit from 02/18/2024 in [facility] Office Visit from 01/21/2024 in [facility] Office Visit from 10/29/2023 in [facility]  C-SSRS RISK CATEGORY No Risk No Risk No Risk     Assessment and Plan: ***  Collaboration of Care: Collaboration of Care: [facility] OP Collaboration of Rjmz:[id number]}  Patient/Guardian was advised Release of Information must be obtained prior to any record release in order to collaborate their care with an outside provider. Patient/Guardian was advised if they have not already done so to contact the registration department  to sign all necessary forms in order for us  to release information regarding their care.   Consent: Patient/Guardian gives verbal consent for treatment and assignment of benefits for services provided during this visit. Patient/Guardian expressed understanding and agreed to proceed.    Myia Bergh, MD 05/05/2024, 3:52 PM

## 2024-05-16 ENCOUNTER — Other Ambulatory Visit: Payer: Self-pay | Admitting: Family Medicine

## 2024-05-16 DIAGNOSIS — J4540 Moderate persistent asthma, uncomplicated: Secondary | ICD-10-CM

## 2024-05-16 DIAGNOSIS — E1169 Type 2 diabetes mellitus with other specified complication: Secondary | ICD-10-CM

## 2024-05-21 ENCOUNTER — Ambulatory Visit: Admitting: Family Medicine

## 2024-05-24 ENCOUNTER — Other Ambulatory Visit: Payer: Self-pay | Admitting: Family Medicine

## 2024-05-24 DIAGNOSIS — E1169 Type 2 diabetes mellitus with other specified complication: Secondary | ICD-10-CM

## 2024-05-25 ENCOUNTER — Encounter: Payer: Self-pay | Admitting: Family Medicine

## 2024-05-25 ENCOUNTER — Ambulatory Visit: Admitting: Family Medicine

## 2024-05-25 VITALS — BP 116/80 | HR 68 | Resp 16 | Ht 61.0 in | Wt 215.0 lb

## 2024-05-25 DIAGNOSIS — E538 Deficiency of other specified B group vitamins: Secondary | ICD-10-CM

## 2024-05-25 DIAGNOSIS — Z23 Encounter for immunization: Secondary | ICD-10-CM

## 2024-05-25 DIAGNOSIS — E039 Hypothyroidism, unspecified: Secondary | ICD-10-CM

## 2024-05-25 DIAGNOSIS — I209 Angina pectoris, unspecified: Secondary | ICD-10-CM

## 2024-05-25 DIAGNOSIS — E785 Hyperlipidemia, unspecified: Secondary | ICD-10-CM

## 2024-05-25 DIAGNOSIS — Z862 Personal history of diseases of the blood and blood-forming organs and certain disorders involving the immune mechanism: Secondary | ICD-10-CM

## 2024-05-25 DIAGNOSIS — F330 Major depressive disorder, recurrent, mild: Secondary | ICD-10-CM | POA: Diagnosis not present

## 2024-05-25 DIAGNOSIS — R110 Nausea: Secondary | ICD-10-CM

## 2024-05-25 DIAGNOSIS — E1169 Type 2 diabetes mellitus with other specified complication: Secondary | ICD-10-CM

## 2024-05-25 DIAGNOSIS — E1159 Type 2 diabetes mellitus with other circulatory complications: Secondary | ICD-10-CM

## 2024-05-25 DIAGNOSIS — J4540 Moderate persistent asthma, uncomplicated: Secondary | ICD-10-CM | POA: Diagnosis not present

## 2024-05-25 DIAGNOSIS — E559 Vitamin D deficiency, unspecified: Secondary | ICD-10-CM

## 2024-05-25 DIAGNOSIS — Z7984 Long term (current) use of oral hypoglycemic drugs: Secondary | ICD-10-CM

## 2024-05-25 DIAGNOSIS — I152 Hypertension secondary to endocrine disorders: Secondary | ICD-10-CM

## 2024-05-25 DIAGNOSIS — K5904 Chronic idiopathic constipation: Secondary | ICD-10-CM

## 2024-05-25 DIAGNOSIS — R1319 Other dysphagia: Secondary | ICD-10-CM

## 2024-05-25 MED ORDER — MONTELUKAST SODIUM 10 MG PO TABS: 10.0000 mg | ORAL_TABLET | Freq: Every day | ORAL | 1 refills | Status: AC

## 2024-05-25 MED ORDER — ROSUVASTATIN CALCIUM 40 MG PO TABS: 40.0000 mg | ORAL_TABLET | Freq: Every day | ORAL | 1 refills | Status: AC

## 2024-05-25 MED ORDER — ONDANSETRON 4 MG PO TBDP: 4.0000 mg | ORAL_TABLET | Freq: Three times a day (TID) | ORAL | 0 refills | Status: AC | PRN

## 2024-05-25 MED ORDER — ARNUITY ELLIPTA 100 MCG/ACT IN AEPB: 1.0000 | INHALATION_SPRAY | Freq: Every day | RESPIRATORY_TRACT | 5 refills | Status: AC

## 2024-05-25 MED ORDER — AMLODIPINE BESYLATE 2.5 MG PO TABS: 2.5000 mg | ORAL_TABLET | Freq: Every day | ORAL | 1 refills | Status: AC

## 2024-05-25 MED ORDER — OLMESARTAN MEDOXOMIL-HCTZ 40-25 MG PO TABS: 1.0000 | ORAL_TABLET | Freq: Every day | ORAL | 1 refills | Status: AC

## 2024-05-25 MED ORDER — PIOGLITAZONE HCL 15 MG PO TABS: 15.0000 mg | ORAL_TABLET | Freq: Every day | ORAL | 1 refills | Status: DC

## 2024-05-25 MED ORDER — METFORMIN HCL ER 750 MG PO TB24: 1500.0000 mg | ORAL_TABLET | Freq: Every day | ORAL | 1 refills | Status: AC

## 2024-05-25 MED ORDER — METOPROLOL SUCCINATE ER 50 MG PO TB24: ORAL_TABLET | ORAL | 1 refills | Status: AC

## 2024-05-25 MED ORDER — LINACLOTIDE 145 MCG PO CAPS: 145.0000 ug | ORAL_CAPSULE | Freq: Every day | ORAL | 1 refills | Status: AC

## 2024-05-25 NOTE — Progress Notes (Signed)
 Name: Beverly Barrett   MRN: [id number]    DOB: 1961/07/05   Date:05/25/2024       Progress Note  Subjective  Chief Complaint  Chief Complaint  Patient presents with   Medical Management of Chronic Issues   Discussed the use of AI scribe software for clinical note transcription with the patient, who gave verbal consent to proceed.  History of Present Illness [name] is a 63 year old female with type 2 diabetes, hypertension, and hypothyroidism who presents for a routine follow-up visit.  She manages her type 2 diabetes with metformin  taken twice daily and pioglitazone , which she is unable to refill. Her last A1c was 6.1% in June. No symptoms of uncontrolled diabetes such as excessive hunger, thirst, or urination. She has a history of diabetic neuropathy and is taking gabapentin  for chronic pain. No issues with her diabetes medications.  For hypertension, she is taking amlodipine  2.5 mg, clonidine  0.1 mg at night, metoprolol , and HCTZ 40/25. She experiences dizziness approximately twice a week, with home blood pressure readings as low as 80's systolic. She has angina and uses nitroglycerin  as needed, having taken it twice recently. She also takes rosuvastatin  40 mg daily for dyslipidemia and reports no muscle aches.  For hypothyroidism, she is on levothyroxine  100 mcg daily. Her TSH was noted to be low in July, but it does not look like the dose was adjusted   She has a history of iron deficiency anemia, with her last iron levels being on the low end of normal. She was previously on ferrous sulfate but is not currently taking it. Her B12 levels were low last year, and she is not taking any B12 supplements.  She has moderate persistent asthma and uses Arnuity once daily and montelukast . She reports recent coughing and wheezing, which improved. She does not see a pulmonologist.  She experiences recurrent nausea and a sensation of food being stuck in her throat, possibly  related to reflux. She takes omeprazole  40 mg for reflux and has a history of diverticulitis. She is not currently seeing her gastroenterologist.  She has obstructive sleep apnea but lacks supplies for her CPAP machine and is not currently using it.  She has major depression, managed by her psychiatrist, and denies any thoughts of self-harm.    Patient Active Problem List   Diagnosis Date Noted   Biliary dyskinesia 08/12/2023   Gastric erythema 08/07/2023   Angina pectoris associated with type 2 diabetes mellitus (HCC) 12/04/2022   Insomnia due to medical condition 12/04/2022   Bereavement 12/04/2022   Recurrent major depressive disorder, in full remission 12/26/2021   Polyp of colon    Chronic idiopathic constipation 12/03/2021   Asthma, well controlled 12/03/2021   Migraine without aura and without status migrainosus, not intractable 12/03/2021   Memory loss 08/27/2021   MDD (major depressive disorder), recurrent episode, mild 07/19/2021   Sleep disorder 07/19/2021   Dyslipidemia associated with type 2 diabetes mellitus (HCC) 03/06/2020   Chronic low back pain 12/31/2018   Grade II internal hemorrhoids 05/16/2017   Mixed stress and urge urinary incontinence 11/12/2016   Degenerative arthritis of lumbar spine 11/12/2016   Common migraine with intractable migraine 09/20/2016   Iron deficiency anemia 09/12/2016   GAD (generalized anxiety disorder) 12/20/2015   OSA on CPAP 12/20/2015   Benign neoplasm of sigmoid colon    Chronic pain of multiple joints 06/14/2014   Arthritis, degenerative 06/14/2014   Morbid obesity with BMI of 40.0-44.9, adult (HCC) 06/14/2014  Multinodular goiter 08/18/2013   Hypertension goal BP (blood pressure) < 140/90 12/07/2010   Familial multiple lipoprotein-type hyperlipidemia 12/07/2010   Allergic rhinitis 10/13/2008    Past Surgical History:  Procedure Laterality Date   ABDOMINAL HYSTERECTOMY  1996   BIOPSY  08/07/2023   Procedure: BIOPSY;   Surgeon: Unk Corinn Skiff, MD;  Location: [facility] ENDOSCOPY;  Service: Gastroenterology;;   BLADDER SUSPENSION  1997   CESAREAN SECTION  1995   CHOLECYSTECTOMY     COLONOSCOPY WITH PROPOFOL  N/A 05/29/2015   Procedure: COLONOSCOPY WITH PROPOFOL ;  Surgeon: Rogelia Copping, MD;  Location: [facility];  Service: Endoscopy;  Laterality: N/A;  Latex allergy sleep apnea - no CPAP machine (yet)   COLONOSCOPY WITH PROPOFOL  N/A 12/18/2021   Procedure: COLONOSCOPY WITH PROPOFOL ;  Surgeon: Unk Corinn Skiff, MD;  Location: [facility] END[facility]COPY;  Service: Gastroenterology;  Laterality: N/A;   COLONOSCOPY WITH PROPOFOL  N/A 08/07/2023   Procedure: COLONOSCOPY WITH PROPOFOL ;  Surgeon: Unk Corinn Skiff, MD;  Location: [facility] ENDOSCOPY;  Service: Gastroenterology;  Laterality: N/A;   ECTOPIC PREGNANCY SURGERY     ESOPHAGOGASTRODUODENOSCOPY N/A 04/23/2017   Procedure: ESOPHAGOGASTRODUODENOSCOPY (EGD);  Surgeon: Unk Corinn Skiff, MD;  Location: [facility];  Service: Gastroenterology;  Laterality: N/A;  Latex allergy sleep apnea   ESOPHAGOGASTRODUODENOSCOPY (EGD) WITH PROPOFOL  N/A 08/07/2023   Procedure: ESOPHAGOGASTRODUODENOSCOPY (EGD) WITH PROPOFOL ;  Surgeon: Unk Corinn Skiff, MD;  Location: [facility] ENDOSCOPY;  Service: Gastroenterology;  Laterality: N/A;   POLYPECTOMY  05/29/2015   Procedure: POLYPECTOMY;  Surgeon: Rogelia Copping, MD;  Location: [facility];  Service: Endoscopy;;   POLYPECTOMY  08/07/2023   Procedure: POLYPECTOMY;  Surgeon: Unk Corinn Skiff, MD;  Location: [facility] ENDOSCOPY;  Service: Gastroenterology;;    Family History  Problem Relation Age of Onset   Diabetes Sister    Anxiety disorder Sister    Depression Sister    Hypertension Sister    Cirrhosis Mother    Diabetes Mother    Cirrhosis Father    Breast cancer Sister 64   Heart attack Brother    Alcohol abuse Brother    Drug abuse Brother    Prostate cancer Neg Hx    Kidney cancer Neg Hx    Bladder Cancer Neg  Hx     Social History   Tobacco Use   Smoking status: Never    Passive exposure: Never   Smokeless tobacco: Never  Substance Use Topics   Alcohol use: No    Alcohol/week: 0.0 standard drinks of alcohol     Current Outpatient Medications:    acetaminophen  (TYLENOL ) 500 MG tablet, Take 2 tablets (1,000 mg total) by mouth every 6 (six) hours as needed for mild pain (pain score 1-3)., Disp: , Rfl:    amLODipine  (NORVASC ) 2.5 MG tablet, Take 1 tablet (2.5 mg total) by mouth daily., Disp: 90 tablet, Rfl: 1   aspirin  81 MG EC tablet, Take 81 mg by mouth daily., Disp: , Rfl:    Cholecalciferol (VITAMIN D ) 50 MCG (2000 UT) CAPS, Take 2,000 Units by mouth daily., Disp: , Rfl:    cloNIDine  (CATAPRES ) 0.1 MG tablet, Take 1 tablet (0.1 mg total) by mouth at bedtime., Disp: 90 tablet, Rfl: 1   dimenhyDRINATE (DRAMAMINE) 50 MG tablet, Take 50 mg by mouth every 8 (eight) hours as needed for nausea., Disp: , Rfl:    fluticasone  (FLONASE ) 50 MCG/ACT nasal spray, Place 2 sprays into both nostrils daily as needed for allergies., Disp: 48 mL, Rfl: 0   Fluticasone  Furoate (ARNUITY  ELLIPTA) 100 MCG/ACT AEPB, Take 1 puff by mouth daily at 12 noon., Disp: 30 each, Rfl: 5   gabapentin  (NEURONTIN ) 400 MG capsule, Take 1 capsule in the morning, take 2 at night, Disp: 270 capsule, Rfl: 1   Galcanezumab -gnlm (EMGALITY ) 120 MG/ML SOAJ, Inject 120 mg into the skin every 30 (thirty) days., Disp: 1.12 mL, Rfl: 11   glucose blood test strip, Use as instructed, Disp: 100 each, Rfl: 12   hydrOXYzine  (ATARAX ) 10 MG tablet, Take 1 tablet (10 mg total) by mouth 2 (two) times daily as needed for anxiety (and sleep)., Disp: 180 tablet, Rfl: 1   Iron-FA-B Cmp-C-Biot-Probiotic (FUSION PLUS) CAPS, Take 1 capsule by mouth once daily, Disp: 30 capsule, Rfl: 0   Lancet Devices (ONE TOUCH DELICA LANCING DEV) MISC, 1 each by Does not apply route daily., Disp: 1 each, Rfl: 0   levothyroxine  (SYNTHROID ) 100 MCG tablet, Take 100 mcg by  mouth daily before breakfast., Disp: , Rfl:    linaclotide  (LINZESS ) 145 MCG CAPS capsule, Take 1 capsule (145 mcg total) by mouth daily., Disp: 90 capsule, Rfl: 1   loratadine (CLARITIN) 10 MG tablet, Take 10 mg by mouth daily., Disp: , Rfl:    metFORMIN  (GLUCOPHAGE -XR) 750 MG 24 hr tablet, Take 2 tablets (1,500 mg total) by mouth daily with breakfast., Disp: 180 tablet, Rfl: 1   metoCLOPramide  (REGLAN ) 5 MG tablet, Take 1 tablet (5 mg total) by mouth daily before supper., Disp: 90 tablet, Rfl: 1   metoprolol  succinate (TOPROL -XL) 50 MG 24 hr tablet, TAKE 1 TABLET BY MOUTH ONCE DAILY WITH OR IMMEDIATELY FOLLOWING A MEAL, Disp: 90 tablet, Rfl: 0   mirtazapine  (REMERON ) 15 MG tablet, Take 1.5 tablets (22.5 mg total) by mouth at bedtime., Disp: 135 tablet, Rfl: 1   montelukast  (SINGULAIR ) 10 MG tablet, Take 1 tablet (10 mg total) by mouth at bedtime., Disp: 90 tablet, Rfl: 1   nitroGLYCERIN  (NITROSTAT ) 0.4 MG SL tablet, Place 1 tablet (0.4 mg total) under the tongue every 5 (five) minutes as needed., Disp: 25 tablet, Rfl: 0   olmesartan -hydrochlorothiazide  (BENICAR  HCT) 40-25 MG tablet, Take 1 tablet by mouth daily., Disp: 90 tablet, Rfl: 1   omeprazole  (PRILOSEC) 40 MG capsule, Take 1 capsule by mouth once daily, Disp: 90 capsule, Rfl: 3   ondansetron  (ZOFRAN -ODT) 4 MG disintegrating tablet, DISSOLVE 1 TABLET IN MOUTH EVERY 8 HOURS AS NEEDED FOR NAUSEA OR VOMITING, Disp: 20 tablet, Rfl: 0   OneTouch Delica Lancets 33G MISC, 1 each by Does not apply route daily., Disp: 100 each, Rfl: 2   pioglitazone  (ACTOS ) 15 MG tablet, Take 1 tablet (15 mg total) by mouth daily., Disp: 90 tablet, Rfl: 1   rizatriptan  (MAXALT ) 10 MG tablet, Take 1 tablet at onset of migraine. May repeat in 2 hours if needed. No more than 2 in 24 hr period, Disp: 10 tablet, Rfl: 11   rosuvastatin  (CRESTOR ) 40 MG tablet, Take 1 tablet (40 mg total) by mouth at bedtime., Disp: 90 tablet, Rfl: 1   sertraline  (ZOLOFT ) 50 MG tablet, Take 1  tablet (50 mg total) by mouth daily with breakfast., Disp: 90 tablet, Rfl: 0   VENTOLIN  HFA 108 (90 Base) MCG/ACT inhaler, INHALE 2 PUFFS BY MOUTH EVERY 6 HOURS AS NEEDED FOR WHEEZING OR SHORTNESS OF BREATH, Disp: 36 g, Rfl: 0  Allergies  Allergen Reactions   Latex Swelling    Pt unsure of reaction pt states something happened while in the hospital after surgery.  I personally reviewed active problem list, medication list, allergies with the patient/caregiver today.   ROS  Ten systems reviewed and is negative except as mentioned in HPI    Objective Physical Exam VITALS: BP- 116/80 CONSTITUTIONAL: Patient appears well-developed and well-nourished. No distress. HEENT: Head atraumatic, normocephalic, neck supple. CARDIOVASCULAR: Normal rate, regular rhythm and normal heart sounds. No murmur heard. No BLE edema. Pulses normal. PULMONARY: Effort normal and breath sounds normal. No respiratory distress. ABDOMINAL: There is no tenderness or distention. MUSCULOSKELETAL: Normal gait. Without gross motor or sensory deficit. PSYCHIATRIC: Patient has a normal mood and affect. Behavior is normal. Judgment and thought content normal. NEUROLOGICAL: Sensation and proprioception intact.  Vitals:   05/25/24 1318  BP: 116/80  Pulse: 68  Resp: 16  SpO2: 99%  Weight: 215 lb (97.5 kg)  Height: 5' 1 (1.549 m)    Body mass index is 40.62 kg/m.   Diabetic Foot Exam:  Diabetic foot exam was performed with the following findings:   No deformities, ulcerations, or other skin breakdown Normal sensation of 10g monofilament Intact posterior tibialis and dorsalis pedis pulses      PHQ2/9:    05/25/2024    1:13 PM 05/05/2024    3:56 PM 02/13/2024   11:07 AM 01/21/2024   11:34 AM 11/20/2023   10:42 AM  Depression screen PHQ 2/9  Decreased Interest 0  3  2  Down, Depressed, Hopeless 0  3  2  PHQ - 2 Score 0  6  4  Altered sleeping   3  1  Tired, decreased energy   3  1  Change in  appetite   1  1  Feeling bad or failure about yourself    1  1  Trouble concentrating   3  1  Moving slowly or fidgety/restless   0  0  Suicidal thoughts   0  0  PHQ-9 Score   17  9  Difficult doing work/chores   Very difficult  Very difficult     Information is confidential and restricted. Go to Review Flowsheets to unlock data.    phq 9 is under the care of psychiatrist   Fall Risk:    05/25/2024    1:13 PM 02/13/2024   11:07 AM 11/20/2023   10:44 AM 09/08/2023    3:10 PM 08/20/2023    3:32 PM  Fall Risk   Falls in the past year? 0 1 1 1 1   Number falls in past yr: 0 0 0 0 0  Injury with Fall? 0 0 0 0 0  Risk for fall due to : No Fall Risks Impaired balance/gait Impaired balance/gait Impaired balance/gait Impaired balance/gait  Follow up Falls evaluation completed Falls evaluation completed Education provided;Falls evaluation completed;Falls prevention discussed Falls prevention discussed Falls prevention discussed;Education provided;Falls evaluation completed      Assessment & Plan Type 2 Diabetes Mellitus with Diabetic Neuropathy, Dyslipidemia and HTN Well-controlled diabetes with A1c of 6.1%. Neuropathy pain managed with gabapentin . - Continue metformin  and pioglitazone . - Continue gabapentin . - Perform urine microalbumin test. - Conduct foot exam.  Angina Pectoris associated with DM type 2 - continue NTG prn - continue DM control - Continue statin therapy and beta blocker  Hypertension Hypertension managed with amlodipine , metoprolol , and HCTZ. Dizziness likely due to low blood pressure. Clonidine  discontinued to prevent rebound hypertension. History of angina managed with metoprolol  and nitroglycerin . - Discontinue clonidine . - Continue amlodipine , metoprolol , and HCTZ. - Monitor blood pressure at home. - Educate about potential  rebound hypertension. - Continue nitroglycerin  as needed.  Dyslipidemia Dyslipidemia managed with rosuvastatin  40 mg daily. - Continue  rosuvastatin  40 mg daily. - Perform lipid panel.  Hypothyroidism adult  Managed with levothyroxine  100 mcg daily. Recent low TSH levels. - Check TSH level. - Send TSH results to endocrinologist.  Asthma Moderate persistent asthma with recent episodes of coughing and wheezing. - Continue Arnuity and montelukast . - Provide refill for Arnuity inhaler.  Gastroesophageal Reflux Disease (GERD) GERD with symptoms of nausea and sensation of food being stuck in the throat. - Provide refill for nausea medication. - Refer to gastroenterologist.  Obstructive Sleep Apnea Managed with CPAP, currently unable to use due to lack of supplies. - Coordinate with CMA to arrange for CPAP supplies.  Major Depressive Disorder, Recurrent, Mild Managed by psychiatrist, no current suicidal ideation.  General Health Maintenance Received flu vaccine, declined shingles vaccine. - Document flu vaccine administration. - Respect decision to decline shingles vaccine.  Follow-up Follow-up plans for various conditions and specialists. - Follow up with endocrinologist in January. - Follow up with gastroenterologist for GERD and nausea. - Coordinate with CMA for CPAP supplies. - Continue regular follow-ups with other specialists as needed.

## 2024-06-21 ENCOUNTER — Other Ambulatory Visit

## 2024-06-21 ENCOUNTER — Other Ambulatory Visit (INDEPENDENT_AMBULATORY_CARE_PROVIDER_SITE_OTHER)

## 2024-06-21 DIAGNOSIS — E1169 Type 2 diabetes mellitus with other specified complication: Secondary | ICD-10-CM

## 2024-06-21 DIAGNOSIS — E119 Type 2 diabetes mellitus without complications: Secondary | ICD-10-CM

## 2024-06-21 DIAGNOSIS — I10 Essential (primary) hypertension: Secondary | ICD-10-CM

## 2024-06-21 DIAGNOSIS — E785 Hyperlipidemia, unspecified: Secondary | ICD-10-CM

## 2024-06-21 DIAGNOSIS — Z794 Long term (current) use of insulin: Secondary | ICD-10-CM

## 2024-06-21 MED ORDER — TIRZEPATIDE 2.5 MG/0.5ML ~~LOC~~ SOAJ: 2.5000 mg | SUBCUTANEOUS | 1 refills | Status: DC

## 2024-06-21 NOTE — Progress Notes (Deleted)
 S:     Reason for visit: ?  Beverly Barrett is a 63 y.o. female with a history of diabetes (type 2), who presents today for a follow up diabetes Telephone pharmacotherapy visit.? Pertinent PMH also includes HTN, migraines, OSA, asthma, obesity, insomnia.  Known DM Complications: {DM complications:33329}   Care Team: Primary Care Provider: Sowles, Krichna, MD  At last visit, ***.   Patient reports Diabetes was diagnosed in ***.   Current diabetes medications include: metformin  XR 750 mg 2 tablets daily, pioglitazone  15 mg daily  Previous diabetes medications include: *** Current hypertension medications include: amlodipine  2.5 mg daily, metoprolol  succcinate 50 mg daily, olmesartan /hydrochlorothiazide  1 tablet daily  Current hyperlipidemia medications include: rosuvastatin  40 mg daily   Patient reports adherence to taking all medications as prescribed.  *** Patient denies adherence with medications, reports missing *** medications *** times per week, on average.  Have you been experiencing any side effects to the medications prescribed? {YES NO:22349} Do you have any problems obtaining medications due to transportation or finances? {YES [id number] Insurance coverage: ***  Current medication access support: ***  Patient {Actions; denies-reports:120008} hypoglycemic events.  Reported home fasting blood sugars: ***  Reported 2 hour post-meal/random blood sugars: ***.  Patient {Actions; denies-reports:120008} nocturia (nighttime urination).  Patient {Actions; denies-reports:120008} neuropathy (nerve pain). Patient {Actions; denies-reports:120008} visual changes. Patient {Actions; denies-reports:120008} self foot exams.   Patient reported dietary habits: Eats *** meals/day Breakfast: *** Lunch: *** Dinner: *** Snacks: *** Drinks: ***  Patient-reported exercise habits: *** DM Prevention:  Statin: {Blank single:19197::***,Taking,Not taking,Intolerant  to,Declines}; {Blank single:19197::low intensity,moderate intensity,high intensity,n/a}.?  ACE/ARB: {Blank single:19197::yes,no}; *** History of chronic kidney disease? {Blank single:19197::yes,no} Last urinary albumin/creatinine ratio:  Lab Results  Component Value Date   MICRALBCREAT 20 04/04/2023   MICRALBCREAT 8 04/03/2022   MICRALBCREAT 8 08/10/2020   Last eye exam:  Lab Results  Component Value Date   HMDIABEYEEXA No Retinopathy 01/22/2024   Lab Results  Component Value Date   HMDIABEYEEXA No Retinopathy 01/22/2024   Last foot exam: 05/25/2024 Tobacco Use:  Tobacco Use: Low Risk  (05/25/2024)   Patient History    Smoking Tobacco Use: Never    Smokeless Tobacco Use: Never    Passive Exposure: Never   O:  {CGM reports:33570} 7 day average blood glucose: ***  Libre3 CGM Download today *** % Time CGM is active: ***% Average Glucose: *** mg/dL Glucose Management Indicator: ***  Glucose Variability: ***% (goal <36%) Time in Goal:  - Time in range 70-180: ***% - Time above range: ***% - Time below range: ***% Observed patterns:  Vitals:  Wt Readings from Last 3 Encounters:  05/25/24 215 lb (97.5 kg)  05/05/24 215 lb 12.8 oz (97.9 kg)  02/20/24 220 lb (99.8 kg)   BP Readings from Last 3 Encounters:  05/25/24 116/80  05/05/24 111/78  02/20/24 130/70   Pulse Readings from Last 3 Encounters:  05/25/24 68  05/05/24 73  02/20/24 89     Labs:?  Lab Results  Component Value Date   HGBA1C 6.1 01/22/2024   HGBA1C 6.0 (A) 07/18/2023   HGBA1C 6.2 01/16/2023   GLUCOSE 98 02/13/2024   MICRALBCREAT 20 04/04/2023   MICRALBCREAT 8 04/03/2022   MICRALBCREAT 8 08/10/2020   CREATININE 0.65 02/13/2024   CREATININE 0.70 08/12/2023   CREATININE 0.62 08/08/2023    Lab Results  Component Value Date   CHOL 136 04/04/2023   LDLCALC 57 04/04/2023   LDLCALC 63 04/03/2022   LDLCALC 88 03/12/2021  HDL 57 04/04/2023   TRIG 136 04/04/2023   TRIG 98  04/03/2022   TRIG 278 (H) 03/12/2021   ALT 13 02/13/2024   ALT 7 04/04/2023   AST 18 02/13/2024   AST 11 04/04/2023      Chemistry      Component Value Date/Time   NA 143 02/13/2024 1228   NA 144 12/20/2015 1012   K 3.9 02/13/2024 1228   CL 103 02/13/2024 1228   CO2 28 02/13/2024 1228   BUN 16 02/13/2024 1228   BUN 17 12/20/2015 1012   CREATININE 0.65 02/13/2024 1228   CREATININE 0.52 04/04/2023 1117      Component Value Date/Time   CALCIUM  9.4 02/13/2024 1228   ALKPHOS 43 02/13/2024 1228   AST 18 02/13/2024 1228   ALT 13 02/13/2024 1228   BILITOT 0.6 02/13/2024 1228   BILITOT 0.2 12/20/2015 1012       The 10-year ASCVD risk score (Arnett DK, et al., 2019) is: 11%  Lab Results  Component Value Date   MICRALBCREAT 20 04/04/2023   MICRALBCREAT 8 04/03/2022   MICRALBCREAT 8 08/10/2020    A/P: Diabetes currently *** with a most recent A1c of *** on ***, which is {DESC; UP/DOWN/UNCHANGED:18711} from *** on ***. Patient is *** able to verbalize appropriate hypoglycemia management plan. Medication adherence appears ***. Control is suboptimal due to ***. -{Meds adjust:18428} basal insulin  {basal insulins:33573}  *** units daily.  -{Meds adjust:18428} rapid insulin  {bolus insulin :33574} ***.  -{Meds adjust:18428} GLP-1 {GLP1 options:33572} *** mg .  -{Meds adjust:18428} SGLT2-I {SGLT2i options:33571}*** mg daily.  -{Meds adjust:18428} metformin  ***.  -Patient educated on purpose, proper use, and potential adverse effects of ***.  -Extensively discussed pathophysiology of diabetes, recommended lifestyle interventions, dietary effects on blood sugar control.  -Counseled on s/sx of and management of hypoglycemia.  -Next A1c anticipated ***.   ASCVD risk - {primary/secondary:33575} prevention in patient with diabetes. Last LDL is *** mg/dL, not at goal of <29 *** mg/dL. ASCVD risk factors include *** and 10-year ASCVD risk score of ***. {Desc; low/moderate/high:110033}  intensity statin indicated.  -{Meds adjust:18428} {statin therapies:33576} *** mg daily.  -{Meds adjust:18428} ezetimibe 10 mg daily   Hypertension longstanding *** currently ***. Blood pressure goal of <130/80 *** mmHg. Medication adherence ***. Blood pressure control is suboptimal due to ***. -{Meds adjust:18428} *** mg ***.  {pharmacisttime:33368}  Follow-up:  Pharmacist on *** PCP clinic visit on ***  Peyton CHARLENA Ferries, PharmD, CPP Clinical Pharmacist [facility] [phone]

## 2024-06-21 NOTE — Telephone Encounter (Unsigned)
 Copied from CRM #[id number]. Topic: Appointments - Scheduling Inquiry for Clinic >> Jun 21, 2024  2:31 PM Tobias L wrote: Reason for CRM: Patient missed call from pharmacist patient was scheduled for today at 2pm. Patient inquiring if appointment could be rescheduled.   Best callback number: [phone]

## 2024-06-21 NOTE — Progress Notes (Signed)
 S:     Reason for visit: ?  Beverly Barrett is a 63 y.o. female with a history of diabetes (type 2), who presents today for a follow up diabetes Telephone pharmacotherapy visit.? Pertinent PMH also includes HTN, migraines, OSA, asthma, obesity, insomnia.  Care Team: Primary Care Provider: Sowles, Krichna, MD  At last visit with PCP on 05/25/24, patient reported some dizziness likely d/t hypotension. Clonidine  was discontinued at that time.   Current diabetes medications include: metformin  XR 750 mg 2 tablets daily, pioglitazone  15 mg daily  Current hypertension medications include: amlodipine  2.5 mg daily, metoprolol  succcinate 50 mg daily, olmesartan /hydrochlorothiazide  1 tablet daily  Current hyperlipidemia medications include: rosuvastatin  40 mg daily   Patient reports adherence to taking all medications as prescribed.   Have you been experiencing any side effects to the medications prescribed? no Do you have any problems obtaining medications due to transportation or finances? no Insurance coverage: Cigna and Secondary Medicaid  Patient denies hypoglycemic events.  Reported home fasting blood sugars: 90-100  Reported 2 hour post-meal/random blood sugars: 122  Patient declines personal hx of pancreatitis or personal or family hx of thyroid  cancer.   Hypertension: Patient has a validated, automated, upper arm home BP cuff Current blood pressure readings readings: 130/80s average, did not have log today   DM Prevention:  Statin: Taking; high intensity.?  ACE/ARB: yes; olmesartan /hydrochlorothiazide   Last urinary albumin/creatinine ratio:  Lab Results  Component Value Date   MICRALBCREAT 20 04/04/2023   MICRALBCREAT 8 04/03/2022   MICRALBCREAT 8 08/10/2020   Last eye exam:  Lab Results  Component Value Date   HMDIABEYEEXA No Retinopathy 01/22/2024   Lab Results  Component Value Date   HMDIABEYEEXA No Retinopathy 01/22/2024   Last foot exam: 05/25/2024 Tobacco  Use:  Tobacco Use: Low Risk  (05/25/2024)   Patient History    Smoking Tobacco Use: Never    Smokeless Tobacco Use: Never    Passive Exposure: Never   O:   Vitals:  Wt Readings from Last 3 Encounters:  05/25/24 215 lb (97.5 kg)  05/05/24 215 lb 12.8 oz (97.9 kg)  02/20/24 220 lb (99.8 kg)   BP Readings from Last 3 Encounters:  05/25/24 116/80  05/05/24 111/78  02/20/24 130/70   Pulse Readings from Last 3 Encounters:  05/25/24 68  05/05/24 73  02/20/24 89     Labs:?  Lab Results  Component Value Date   HGBA1C 6.1 01/22/2024   HGBA1C 6.0 (A) 07/18/2023   HGBA1C 6.2 01/16/2023   GLUCOSE 98 02/13/2024   MICRALBCREAT 20 04/04/2023   MICRALBCREAT 8 04/03/2022   MICRALBCREAT 8 08/10/2020   CREATININE 0.65 02/13/2024   CREATININE 0.70 08/12/2023   CREATININE 0.62 08/08/2023    Lab Results  Component Value Date   CHOL 136 04/04/2023   LDLCALC 57 04/04/2023   LDLCALC 63 04/03/2022   LDLCALC 88 03/12/2021   HDL 57 04/04/2023   TRIG 136 04/04/2023   TRIG 98 04/03/2022   TRIG 278 (H) 03/12/2021   ALT 13 02/13/2024   ALT 7 04/04/2023   AST 18 02/13/2024   AST 11 04/04/2023      Chemistry      Component Value Date/Time   NA 143 02/13/2024 1228   NA 144 12/20/2015 1012   K 3.9 02/13/2024 1228   CL 103 02/13/2024 1228   CO2 28 02/13/2024 1228   BUN 16 02/13/2024 1228   BUN 17 12/20/2015 1012   CREATININE 0.65 02/13/2024 1228  CREATININE 0.52 04/04/2023 1117      Component Value Date/Time   CALCIUM  9.4 02/13/2024 1228   ALKPHOS 43 02/13/2024 1228   AST 18 02/13/2024 1228   ALT 13 02/13/2024 1228   BILITOT 0.6 02/13/2024 1228   BILITOT 0.2 12/20/2015 1012       The 10-year ASCVD risk score (Arnett DK, et al., 2019) is: 11%  Lab Results  Component Value Date   MICRALBCREAT 20 04/04/2023   MICRALBCREAT 8 04/03/2022   MICRALBCREAT 8 08/10/2020    A/P: Diabetes currently controlled with a most recent A1c of 6.1% on 01/22/24.  Medication adherence  appears appropriate. Patient would be a good candidate for tirzepeptide given obesity and OSA dx. Will initiate in place of TZD therapy given level of glycemic control and potential for weight gain with this medication class. -Discontinued pioglitazone  -Started GLP-1 Mounjaro  (tirzepatide )  2.5 mg weekly -Continued metformin  XR 750 mg 2 tablets daily.  -Patient educated on purpose, proper use, and potential adverse effects of Mounjaro .  -Extensively discussed pathophysiology of diabetes, recommended lifestyle interventions, dietary effects on blood sugar control.  -Counseled on s/sx of and management of hypoglycemia.   ASCVD risk - primary prevention in patient with diabetes. Last LDL is 57 mg/dL, at goal of <29 mg/dL.  -Continued rosuvastatin  40 mg daily.   Patient verbalized understanding of treatment plan. Total time patient counseling 30 minutes.  Follow-up:  Pharmacist on 07/19/24 PCP clinic visit on 11/26/24  Peyton CHARLENA Ferries, PharmD, CPP Clinical Pharmacist [facility] [phone]

## 2024-07-01 ENCOUNTER — Encounter: Payer: Self-pay | Admitting: Psychiatry

## 2024-07-01 ENCOUNTER — Other Ambulatory Visit: Payer: Self-pay

## 2024-07-01 ENCOUNTER — Ambulatory Visit: Admitting: Psychiatry

## 2024-07-01 VITALS — BP 135/85 | HR 67 | Temp 97.6°F | Ht 61.0 in | Wt 217.0 lb

## 2024-07-01 DIAGNOSIS — F411 Generalized anxiety disorder: Secondary | ICD-10-CM

## 2024-07-01 DIAGNOSIS — G4701 Insomnia due to medical condition: Secondary | ICD-10-CM | POA: Diagnosis not present

## 2024-07-01 DIAGNOSIS — F3342 Major depressive disorder, recurrent, in full remission: Secondary | ICD-10-CM | POA: Diagnosis not present

## 2024-07-01 NOTE — Progress Notes (Signed)
 [facility] MD OP Progress Note  07/01/2024 4:16 PM Beverly Barrett  MRN:  [id number]  Chief Complaint:  Chief Complaint  Patient presents with   Follow-up   Depression   Medication Refill   Insomnia   Anxiety   Discussed the use of AI scribe software for clinical note transcription with the patient, who gave verbal consent to proceed.  History of Present Illness Beverly Barrett is a 63 year old African-American female, married, lives in [place], has a history of MDD, GAD, chronic pain, arthritis, history of obstructive sleep apnea on CPAP, hypothyroidism, migraine headaches was evaluated in office today for a follow-up appointment.  She reports improvement in depression and anxiety symptoms since the last visit in October, describing feeling better overall and stating that her mood has been good. She continues to manage daily responsibilities, including caring for her 13-year-old granddaughter at night, which she notes can be challenging.  She experiences intermittent sleep difficulties, and she reports that issues with her CPAP supplies and the demands of caring for her granddaughter at night contribute to this. She has not used her CPAP recently due to difficulties obtaining supplies, but she continues to clean and maintain the equipment she has.  She reports appetite is fair.  She denies any thoughts of hurting herself or others.  Her current medication regimen includes sertraline  50 mg and mirtazapine , with no changes since the last visit. She confirms having a sufficient supply of both medications and is not due for a refill at this time.  She engages in cooking for family gatherings, including preparing turkey, potato salad, and pound cake for Thanksgiving. She reports attempting to exercise.    Visit Diagnosis:    ICD-10-CM   1. Recurrent major depressive disorder, in full remission  F33.42     2. GAD (generalized anxiety disorder)  F41.1     3. Insomnia due to medical  condition  G47.01    mood, insomnia, OSA on CPAP      Past Psychiatric History: I have reviewed past psychiatric history from progress note on 01/21/2018.  Past Medical History:  Past Medical History:  Diagnosis Date   Anginal pain    none in approx 2 yrs   Anxiety    Asthma    Benign neoplasm of sigmoid colon    Chronic constipation    Chronic lower back pain    Common migraine with intractable migraine 09/20/2016   Depression    suicidal ideation   GERD (gastroesophageal reflux disease)    Headache    migraines -3x/wk   Hyperlipidemia    Hypertension    Hypothyroidism    Iron deficiency anemia    Morbid obesity with BMI of 40.0-44.9, adult (HCC)    Motion sickness    cars   Multilevel degenerative disc disease    Multinodular goiter    Shortness of breath dyspnea    Sleep apnea    CPAP   Type 2 diabetes mellitus with diabetic polyneuropathy, without long-term current use of insulin  (HCC)    Vertigo    Vitamin D  deficiency    Wears dentures    partial upper    Past Surgical History:  Procedure Laterality Date   ABDOMINAL HYSTERECTOMY  1996   BIOPSY  08/07/2023   Procedure: BIOPSY;  Surgeon: Unk Corinn Skiff, MD;  Location: [facility] ENDOSCOPY;  Service: Gastroenterology;;   BLADDER SUSPENSION  1997   CESAREAN SECTION  1995   CHOLECYSTECTOMY     COLONOSCOPY WITH PROPOFOL  N/A 05/29/2015   Procedure:  COLONOSCOPY WITH PROPOFOL ;  Surgeon: Rogelia Copping, MD;  Location: [facility];  Service: Endoscopy;  Laterality: N/A;  Latex allergy sleep apnea - no CPAP machine (yet)   COLONOSCOPY WITH PROPOFOL  N/A 12/18/2021   Procedure: COLONOSCOPY WITH PROPOFOL ;  Surgeon: Unk Corinn Skiff, MD;  Location: [facility] ENDOSCOPY;  Service: Gastroenterology;  Laterality: N/A;   COLONOSCOPY WITH PROPOFOL  N/A 08/07/2023   Procedure: COLONOSCOPY WITH PROPOFOL ;  Surgeon: Unk Corinn Skiff, MD;  Location: [facility] ENDOSCOPY;  Service: Gastroenterology;  Laterality: N/A;   ECTOPIC  PREGNANCY SURGERY     ESOPHAGOGASTRODUODENOSCOPY N/A 04/23/2017   Procedure: ESOPHAGOGASTRODUODENOSCOPY (EGD);  Surgeon: Unk Corinn Skiff, MD;  Location: [facility];  Service: Gastroenterology;  Laterality: N/A;  Latex allergy sleep apnea   ESOPHAGOGASTRODUODENOSCOPY (EGD) WITH PROPOFOL  N/A 08/07/2023   Procedure: ESOPHAGOGASTRODUODENOSCOPY (EGD) WITH PROPOFOL ;  Surgeon: Unk Corinn Skiff, MD;  Location: [facility] ENDOSCOPY;  Service: Gastroenterology;  Laterality: N/A;   POLYPECTOMY  05/29/2015   Procedure: POLYPECTOMY;  Surgeon: Rogelia Copping, MD;  Location: [facility];  Service: Endoscopy;;   POLYPECTOMY  08/07/2023   Procedure: POLYPECTOMY;  Surgeon: Unk Corinn Skiff, MD;  Location: [facility] ENDOSCOPY;  Service: Gastroenterology;;    Family Psychiatric History: I have reviewed family history from progress note on 01/21/2018.  Family History:  Family History  Problem Relation Age of Onset   Diabetes Sister    Anxiety disorder Sister    Depression Sister    Hypertension Sister    Cirrhosis Mother    Diabetes Mother    Cirrhosis Father    Breast cancer Sister 14   Heart attack Brother    Alcohol abuse Brother    Drug abuse Brother    Prostate cancer Neg Hx    Kidney cancer Neg Hx    Bladder Cancer Neg Hx     Social History: I have reviewed social history from progress note on 01/21/2018. Social History   Socioeconomic History   Marital status: Married    Spouse name: training and development officer   Number of children: 3   Years of education: Not on file   Highest education level: GED or equivalent  Occupational History   Not on file  Tobacco Use   Smoking status: Never    Passive exposure: Never   Smokeless tobacco: Never  Vaping Use   Vaping status: Never Used  Substance and Sexual Activity   Alcohol use: No    Alcohol/week: 0.0 standard drinks of alcohol   Drug use: No   Sexual activity: Not Currently    Partners: Male  Other Topics Concern   Not on file  Social  History Narrative   Lives with husband and one daughter    She does not work, husband on disability    Social Drivers of Health   Financial Resource Strain: Medium Risk (05/24/2024)   Overall Financial Resource Strain (CARDIA)    Difficulty of Paying Living Expenses: Somewhat hard  Food Insecurity: Food Insecurity Present (05/24/2024)   Hunger Vital Sign    Worried About Running Out of Food in the Last Year: Sometimes true    Ran Out of Food in the Last Year: Sometimes true  Transportation Needs: No Transportation Needs (05/24/2024)   PRAPARE - Administrator, Civil Service (Medical): No    Lack of Transportation (Non-Medical): No  Physical Activity: Insufficiently Active (05/24/2024)   Exercise Vital Sign    Days of Exercise per Week: 3 days    Minutes of Exercise per Session: 10 min  Stress: Stress Concern Present (05/24/2024)   [employer] of Occupational Health - Occupational Stress Questionnaire    Feeling of Stress: Very much  Social Connections: Moderately Isolated (05/24/2024)   Social Connection and Isolation Panel    Frequency of Communication with Friends and Family: More than three times a week    Frequency of Social Gatherings with Friends and Family: Patient declined    Attends Religious Services: Patient declined    Database Administrator or Organizations: No    Attends Engineer, Structural: Not on file    Marital Status: Married    Allergies:  Allergies  Allergen Reactions   Latex Swelling    Pt unsure of reaction pt states something happened while in the hospital after surgery.    Metabolic Disorder Labs: Lab Results  Component Value Date   HGBA1C 6.1 01/22/2024   MPG 148 08/06/2021   MPG 162.81 08/28/2019   No results found for: PROLACTIN Lab Results  Component Value Date   CHOL 136 04/04/2023   TRIG 136 04/04/2023   HDL 57 04/04/2023   CHOLHDL 2.4 04/04/2023   VLDL 44 (H) 02/12/2017   LDLCALC 57 04/04/2023    LDLCALC 63 04/03/2022   Lab Results  Component Value Date   TSH 0.48 03/04/2022   TSH 0.01 (L) 12/03/2021    Therapeutic Level Labs: No results found for: LITHIUM No results found for: VALPROATE No results found for: CBMZ  Current Medications: Current Outpatient Medications  Medication Sig Dispense Refill   cloNIDine  (CATAPRES ) 0.1 MG tablet Take 0.1 mg by mouth at bedtime.     acetaminophen  (TYLENOL ) 500 MG tablet Take 2 tablets (1,000 mg total) by mouth every 6 (six) hours as needed for mild pain (pain score 1-3).     amLODipine  (NORVASC ) 2.5 MG tablet Take 1 tablet (2.5 mg total) by mouth daily. 90 tablet 1   aspirin  81 MG EC tablet Take 81 mg by mouth daily.     Cholecalciferol (VITAMIN D ) 50 MCG (2000 UT) CAPS Take 2,000 Units by mouth daily.     dimenhyDRINATE (DRAMAMINE) 50 MG tablet Take 50 mg by mouth every 8 (eight) hours as needed for nausea.     fluticasone  (FLONASE ) 50 MCG/ACT nasal spray Place 2 sprays into both nostrils daily as needed for allergies. 48 mL 0   Fluticasone  Furoate (ARNUITY ELLIPTA ) 100 MCG/ACT AEPB Take 1 puff by mouth daily at 12 noon. 30 each 5   gabapentin  (NEURONTIN ) 400 MG capsule Take 1 capsule in the morning, take 2 at night 270 capsule 1   Galcanezumab -gnlm (EMGALITY ) 120 MG/ML SOAJ Inject 120 mg into the skin every 30 (thirty) days. 1.12 mL 11   glucose blood test strip Use as instructed 100 each 12   hydrOXYzine  (ATARAX ) 10 MG tablet Take 1 tablet (10 mg total) by mouth 2 (two) times daily as needed for anxiety (and sleep). 180 tablet 1   Iron-FA-B Cmp-C-Biot-Probiotic (FUSION PLUS) CAPS Take 1 capsule by mouth once daily 30 capsule 0   Lancet Devices (ONE TOUCH DELICA LANCING DEV) MISC 1 each by Does not apply route daily. 1 each 0   levothyroxine  (SYNTHROID ) 100 MCG tablet Take 100 mcg by mouth daily before breakfast.     linaclotide  (LINZESS ) 145 MCG CAPS capsule Take 1 capsule (145 mcg total) by mouth daily. 90 capsule 1   loratadine  (CLARITIN) 10 MG tablet Take 10 mg by mouth daily.     metFORMIN  (GLUCOPHAGE -XR) 750 MG 24 hr  tablet Take 2 tablets (1,500 mg total) by mouth daily with breakfast. 180 tablet 1   metoCLOPramide  (REGLAN ) 5 MG tablet Take 1 tablet (5 mg total) by mouth daily before supper. 90 tablet 1   metoprolol  succinate (TOPROL -XL) 50 MG 24 hr tablet TAKE 1 TABLET BY MOUTH ONCE DAILY WITH OR IMMEDIATELY FOLLOWING A MEAL 90 tablet 1   mirtazapine  (REMERON ) 15 MG tablet Take 1.5 tablets (22.5 mg total) by mouth at bedtime. 135 tablet 1   montelukast  (SINGULAIR ) 10 MG tablet Take 1 tablet (10 mg total) by mouth at bedtime. 90 tablet 1   nitroGLYCERIN  (NITROSTAT ) 0.4 MG SL tablet Place 1 tablet (0.4 mg total) under the tongue every 5 (five) minutes as needed. 25 tablet 0   olmesartan -hydrochlorothiazide  (BENICAR  HCT) 40-25 MG tablet Take 1 tablet by mouth daily. 90 tablet 1   omeprazole  (PRILOSEC) 40 MG capsule Take 1 capsule by mouth once daily 90 capsule 3   ondansetron  (ZOFRAN -ODT) 4 MG disintegrating tablet Take 1 tablet (4 mg total) by mouth every 8 (eight) hours as needed for nausea or vomiting. 20 tablet 0   OneTouch Delica Lancets 33G MISC 1 each by Does not apply route daily. 100 each 2   rizatriptan  (MAXALT ) 10 MG tablet Take 1 tablet at onset of migraine. May repeat in 2 hours if needed. No more than 2 in 24 hr period 10 tablet 11   rosuvastatin  (CRESTOR ) 40 MG tablet Take 1 tablet (40 mg total) by mouth at bedtime. 90 tablet 1   sertraline  (ZOLOFT ) 50 MG tablet Take 1 tablet (50 mg total) by mouth daily with breakfast. 90 tablet 0   tirzepatide  (MOUNJARO ) 2.5 MG/0.5ML Pen Inject 2.5 mg into the skin once a week. 2 mL 1   VENTOLIN  HFA 108 (90 Base) MCG/ACT inhaler INHALE 2 PUFFS BY MOUTH EVERY 6 HOURS AS NEEDED FOR WHEEZING OR SHORTNESS OF BREATH 36 g 0   No current facility-administered medications for this visit.     Musculoskeletal: Strength & Muscle Tone: within normal limits Gait & Station: walks  with cane Patient leans: N/A  Psychiatric Specialty Exam: Review of Systems  Psychiatric/Behavioral:  The patient is nervous/anxious.     Blood pressure 135/85, pulse 67, temperature 97.6 F (36.4 C), temperature source Temporal, height 5' 1 (1.549 m), weight 217 lb (98.4 kg).Body mass index is 41 kg/m.  General Appearance: Casual  Eye Contact:  Fair  Speech:  Clear and Coherent  Volume:  Normal  Mood:  Anxious  Affect:  Appropriate  Thought Process:  Goal Directed and Descriptions of Associations: Intact  Orientation:  Full (Time, Place, and Person)  Thought Content: Logical   Suicidal Thoughts:  No  Homicidal Thoughts:  No  Memory:  Immediate;   Fair Recent;   Fair Remote;   Fair  Judgement:  Fair  Insight:  Fair  Psychomotor Activity:  Normal  Concentration:  Concentration: Fair and Attention Span: Fair  Recall:  [employer] of Knowledge: Fair  Language: Fair  Akathisia:  No  Handed:  Right  AIMS (if indicated): not done  Assets:  Communication Skills Desire for Improvement Housing Social Support Transportation  ADL's:  Intact  Cognition: WNL  Sleep:  improving   Screenings: Geneticist, Molecular Office Visit from 12/26/2021 in [facility] Office Visit from 10/24/2021 in [facility]  AIMS Total Score 0 0   GAD-7    Flowsheet Row Office Visit  from 05/05/2024 in [facility] Office Visit from 02/13/2024 in [facility] Office Visit from 01/21/2024 in [facility] Office Visit from 11/20/2023 in [facility] Office Visit from 08/20/2023 in [facility]  Total GAD-7 Score 8 7 9 7 7    Mini-Mental    Flowsheet Row Office Visit from 12/04/2022 in [facility]  Total Score (max 30 points ) 30    PHQ2-9    Flowsheet Row Office Visit from 05/25/2024 in [facility] Office Visit from 05/05/2024 in [facility] Office Visit from 02/13/2024 in [facility] Office Visit from 01/21/2024 in [facility] Office Visit from 11/20/2023 in [facility]  PHQ-2 Total Score 0 2 6 2 4   PHQ-9 Total Score -- 5 17 10 9    Flowsheet Row Office Visit from 07/01/2024 in [facility] Office Visit from 05/05/2024 in [facility] Office Visit from 02/18/2024 in [facility]  C-SSRS RISK CATEGORY No Risk No Risk No Risk     Assessment and Plan: Alette Kataoka is a 63 year old African-American female who presented for a follow-up appointment, discussed assessment and plan as noted below.  1. Recurrent major depressive disorder, in full remission Currently denies any significant depression symptoms Continue Mirtazapine  and Sertraline  as prescribed  2. GAD (generalized anxiety disorder)-improving Currently reports anxiety as manageable. Continue Sertraline  50 mg daily Continue Mirtazapine  22.5 mg daily at bedtime Continue Hydroxyzine  10 mg twice a day as needed for anxiety  3. Insomnia due to medical condition-stable Reports overall sleep is good although does have CPAP problems. Patient encouraged to follow-up with provider for management of CPAP issues. Continue sleep hygiene techniques Continue Mirtazapine  and Hydroxyzine  as prescribed, also helps with sleep.  Follow-up Follow-up in clinic in 2 to 3 months or sooner if needed.  Collaboration of Care: Collaboration of Care: Other referred for psychotherapy sessions last visit.  Encouraged to establish care.  Patient/Guardian was advised Release of Information must be obtained  prior to any record release in order to collaborate their care with an outside provider. Patient/Guardian was advised if they have not already done so to contact the registration department to sign all necessary forms in order for us  to release information regarding their care.   Consent: Patient/Guardian gives verbal consent for treatment and assignment of benefits for services provided during this visit. Patient/Guardian expressed understanding and agreed to proceed.   This note was generated in part or whole with voice recognition software. Voice recognition is usually quite accurate but there are transcription errors that can and very often do occur. I apologize for any typographical errors that were not detected and corrected.    Beverly Posada, MD 07/04/2024, 8:35 AM

## 2024-07-15 ENCOUNTER — Other Ambulatory Visit (HOSPITAL_COMMUNITY): Payer: Self-pay

## 2024-07-15 ENCOUNTER — Telehealth: Payer: Self-pay | Admitting: Pharmacy Technician

## 2024-07-15 NOTE — Telephone Encounter (Signed)
 Pharmacy Patient Advocate Encounter   Received notification from CoverMyMeds that prior authorization for Mounjaro  2.5MG /0.5ML auto-injectors is required/requested.   Insurance verification completed.   The patient is insured through [employer].   Per test claim: PA required; PA started via CoverMyMeds. KEY [id number] . Waiting for clinical questions to populate.

## 2024-07-16 ENCOUNTER — Other Ambulatory Visit (HOSPITAL_COMMUNITY): Payer: Self-pay

## 2024-07-19 ENCOUNTER — Other Ambulatory Visit (INDEPENDENT_AMBULATORY_CARE_PROVIDER_SITE_OTHER)

## 2024-07-19 ENCOUNTER — Other Ambulatory Visit (HOSPITAL_COMMUNITY): Payer: Self-pay

## 2024-07-19 VITALS — BP 135/88

## 2024-07-19 DIAGNOSIS — Z794 Long term (current) use of insulin: Secondary | ICD-10-CM

## 2024-07-19 DIAGNOSIS — E119 Type 2 diabetes mellitus without complications: Secondary | ICD-10-CM

## 2024-07-19 NOTE — Telephone Encounter (Signed)
 Pharmacy Patient Advocate Encounter  Received notification from [employer] that Prior Authorization for Mounjaro  2.5MG /0.5ML auto-injectors has been APPROVED from 07/15/24 to 07/16/25   PA #/Case ID/Reference #: [id number]

## 2024-07-19 NOTE — Progress Notes (Signed)
 "  S:     Reason for visit: ?  Beverly Barrett is a 63 y.o. female with a history of diabetes (type 2), who presents today for a follow up diabetes Telephone pharmacotherapy visit.? Pertinent PMH also includes HTN, migraines, OSA, asthma, obesity, insomnia.  They were referred to the pharmacist by their PCP for assistance in managing medication access.  Care Team: Primary Care Provider: Sowles, Krichna, MD  At last visit with PCP on 05/25/24, patient reported some dizziness likely d/t hypotension. Clonidine  was discontinued at that time.   At last visit with clinical pharmacist on 06/21/24, patient was instructed to discontinue pioglitazone  and initiate Mounjaro  2.5 mg weekly for DM management with concurrent dx of obesity and OSA.  Today, patient reports she is doing well with Mounjaro . She reported some initial nausea with use of Mounjaro  that has since resolved. She reported an instance of an elevated BP reading of 160/101 mmHg at home during which time she administered clonidine  0.1 mg. BP then returned to 132/88 mmHg.   Current diabetes medications include: metformin  XR 750 mg 2 tablets daily, Mounjaro  2.5 mg weekly  Previous diabetes medications: pioglitazone  Current hypertension medications include: amlodipine  2.5 mg daily, metoprolol  succcinate 50 mg daily, olmesartan /hydrochlorothiazide  1 tablet daily Current hyperlipidemia medications include: rosuvastatin  40 mg daily   Patient reports adherence to taking all medications as prescribed.   Have you been experiencing any side effects to the medications prescribed? no Do you have any problems obtaining medications due to transportation or finances? no Insurance coverage: [employer] and Secondary Medicaid  Patient denies hypoglycemic events.  Reported home fasting blood sugars: 86-100 mg/dL Reported 2 hour post-meal/random blood sugars: 122 mg/dL  Patient declines personal hx of pancreatitis or personal or family hx of thyroid   cancer.   Hypertension: Patient has a validated, automated, upper arm home BP cuff Current blood pressure readings readings: 135/88 mmHg today, reports has been ~130/80s at home  Patient reports hypotensive s/sx including dizziness with positional changes Patient denies hypertensive symptoms including chest pain  Patient reports some headaches/migraines. She does report some shortness of breath DM Prevention:  Statin: Taking; high intensity.?  ACE/ARB: yes; olmesartan /hydrochlorothiazide   Last urinary albumin/creatinine ratio:  Lab Results  Component Value Date   MICRALBCREAT 20 04/04/2023   MICRALBCREAT 8 04/03/2022   MICRALBCREAT 8 08/10/2020   Last eye exam:  Lab Results  Component Value Date   HMDIABEYEEXA No Retinopathy 01/22/2024   Lab Results  Component Value Date   HMDIABEYEEXA No Retinopathy 01/22/2024   Last foot exam: 05/25/2024 Tobacco Use:  Tobacco Use: Low Risk (07/01/2024)   Patient History    Smoking Tobacco Use: Never    Smokeless Tobacco Use: Never    Passive Exposure: Never   O:   Vitals:  Wt Readings from Last 3 Encounters:  07/01/24 217 lb (98.4 kg)  05/25/24 215 lb (97.5 kg)  05/05/24 215 lb 12.8 oz (97.9 kg)   BP Readings from Last 3 Encounters:  07/01/24 135/85  05/25/24 116/80  05/05/24 111/78   Pulse Readings from Last 3 Encounters:  07/01/24 67  05/25/24 68  05/05/24 73     Labs:?  Lab Results  Component Value Date   HGBA1C 6.1 01/22/2024   HGBA1C 6.0 (A) 07/18/2023   HGBA1C 6.2 01/16/2023   GLUCOSE 98 02/13/2024   MICRALBCREAT 20 04/04/2023   MICRALBCREAT 8 04/03/2022   MICRALBCREAT 8 08/10/2020   CREATININE 0.65 02/13/2024   CREATININE 0.70 08/12/2023   CREATININE 0.62 08/08/2023  Lab Results  Component Value Date   CHOL 136 04/04/2023   LDLCALC 57 04/04/2023   LDLCALC 63 04/03/2022   LDLCALC 88 03/12/2021   HDL 57 04/04/2023   TRIG 136 04/04/2023   TRIG 98 04/03/2022   TRIG 278 (H) 03/12/2021   ALT 13  02/13/2024   ALT 7 04/04/2023   AST 18 02/13/2024   AST 11 04/04/2023      Chemistry      Component Value Date/Time   NA 143 02/13/2024 1228   NA 144 12/20/2015 1012   K 3.9 02/13/2024 1228   CL 103 02/13/2024 1228   CO2 28 02/13/2024 1228   BUN 16 02/13/2024 1228   BUN 17 12/20/2015 1012   CREATININE 0.65 02/13/2024 1228   CREATININE 0.52 04/04/2023 1117      Component Value Date/Time   CALCIUM  9.4 02/13/2024 1228   ALKPHOS 43 02/13/2024 1228   AST 18 02/13/2024 1228   ALT 13 02/13/2024 1228   BILITOT 0.6 02/13/2024 1228   BILITOT 0.2 12/20/2015 1012       The 10-year ASCVD risk score (Arnett DK, et al., 2019) is: 15.7%  Lab Results  Component Value Date   MICRALBCREAT 20 04/04/2023   MICRALBCREAT 8 04/03/2022   MICRALBCREAT 8 08/10/2020    A/P: Diabetes currently controlled with a most recent A1c of 6.1% on 01/22/24.  Medication adherence appears appropriate. Patient experienced some slight nausea after initial dose of Mounjaro  that has since resolved. Home BG readings have been at goal. Patient is interested in a dose increase in GLP/GIP for additional weight loss benefit.  -Increased dose of GLP-1 Mounjaro  (tirzepatide )  5 mg weekly -Continued metformin  XR 750 mg 2 tablets daily.  -Patient educated on purpose, proper use, and potential adverse effects of Mounjaro .  -Extensively discussed pathophysiology of diabetes, recommended lifestyle interventions, dietary effects on blood sugar control.  -Counseled on s/sx of and management of hypoglycemia.   ASCVD risk - primary prevention in patient with diabetes. Last LDL is 57 mg/dL, at goal of <29 mg/dL.  -Continued rosuvastatin  40 mg daily.   Hypertension longstanding currently controlled. Blood pressure goal of <130/80 mmHg. Medication adherence appears appropriate. Single instance of elevated home BP at 160/101 mmHg that was not accompanied with any concerning symptoms. Patient reported BP returned to 132/88 mmHg  following clonidine  0.1 mg administration. Patient has only had to take clonidine  once since it was discontinued on 05/25/24.  -Continued amlodipine  2.5 mg daily. -Continued metoprolol  succinate 50 mg daily. -Continued olmesartan /hydrochlorothiazide  40/25 mg daily. -Counseled for patient to ED if hypertensive symptoms present. Patient vocalized understanding.   Patient verbalized understanding of treatment plan. Total time patient counseling 30 minutes.  Follow-up:  Pharmacist on 08/16/24 PCP clinic visit on 11/26/24  Peyton CHARLENA Ferries, PharmD, CPP Clinical Pharmacist [facility] [phone]   "

## 2024-07-20 MED ORDER — MOUNJARO 5 MG/0.5ML ~~LOC~~ SOAJ: 5.0000 mg | SUBCUTANEOUS | 2 refills | Status: AC

## 2024-08-16 ENCOUNTER — Other Ambulatory Visit (INDEPENDENT_AMBULATORY_CARE_PROVIDER_SITE_OTHER)

## 2024-08-16 DIAGNOSIS — Z794 Long term (current) use of insulin: Secondary | ICD-10-CM

## 2024-08-16 DIAGNOSIS — I10 Essential (primary) hypertension: Secondary | ICD-10-CM

## 2024-08-16 DIAGNOSIS — E119 Type 2 diabetes mellitus without complications: Secondary | ICD-10-CM

## 2024-08-16 NOTE — Progress Notes (Signed)
 "  S:     Reason for visit: ?  Anaelle Dunton is a 64 y.o. female with a history of diabetes (type 2), who presents today for a follow up diabetes Telephone pharmacotherapy visit.? Pertinent PMH also includes HTN, migraines, OSA, asthma, obesity, insomnia.  They were referred to the pharmacist by their PCP for assistance in managing medication access.  Care Team: Primary Care Provider: Sowles, Krichna, MD  Current diabetes medications include: metformin  XR 750 mg 2 tablets daily, Mounjaro  5 mg weekly  Previous diabetes medications: pioglitazone  Current hypertension medications include: amlodipine  2.5 mg daily, metoprolol  succcinate 50 mg daily, olmesartan /hydrochlorothiazide  1 tablet daily Current hyperlipidemia medications include: rosuvastatin  40 mg daily   Patient reports adherence to taking all medications as prescribed.   Have you been experiencing any side effects to the medications prescribed? no Do you have any problems obtaining medications due to transportation or finances? no Insurance coverage: [employer]igna and Secondary Medicaid  Patient denies hypoglycemic events.  Reported home fasting blood sugars: 85-100 mg/dL  Patient declines personal hx of pancreatitis or personal or family hx of thyroid  cancer.   Hypertension: Patient has a validated, automated, upper arm home BP cuff Current blood pressure readings readings: reports 130/80s after medications  Patient reports hypotensive s/sx including dizziness with positional changes Patient denies hypertensive symptoms including SOB or HA  Patient reports a single episode of chest pain, during which time she took one nitroglycerin  tablet. She reports the chest pain resolved after a few minutes.  DM Prevention:  Statin: Taking; high intensity.?  ACE/ARB: yes; olmesartan /hydrochlorothiazide   Last urinary albumin/creatinine ratio:  Lab Results  Component Value Date   MICRALBCREAT 20 04/04/2023   MICRALBCREAT 8 04/03/2022    MICRALBCREAT 8 08/10/2020   Last eye exam:  Lab Results  Component Value Date   HMDIABEYEEXA No Retinopathy 01/22/2024   Lab Results  Component Value Date   HMDIABEYEEXA No Retinopathy 01/22/2024   Last foot exam: 05/25/2024 Tobacco Use:  Tobacco Use: Low Risk (07/01/2024)   Patient History    Smoking Tobacco Use: Never    Smokeless Tobacco Use: Never    Passive Exposure: Never   O:   Vitals:  Wt Readings from Last 3 Encounters:  07/01/24 217 lb (98.4 kg)  05/25/24 215 lb (97.5 kg)  05/05/24 215 lb 12.8 oz (97.9 kg)   BP Readings from Last 3 Encounters:  07/19/24 135/88  07/01/24 135/85  05/25/24 116/80   Pulse Readings from Last 3 Encounters:  07/01/24 67  05/25/24 68  05/05/24 73     Labs:?  Lab Results  Component Value Date   HGBA1C 6.1 01/22/2024   HGBA1C 6.0 (A) 07/18/2023   HGBA1C 6.2 01/16/2023   GLUCOSE 98 02/13/2024   MICRALBCREAT 20 04/04/2023   MICRALBCREAT 8 04/03/2022   MICRALBCREAT 8 08/10/2020   CREATININE 0.65 02/13/2024   CREATININE 0.70 08/12/2023   CREATININE 0.62 08/08/2023    Lab Results  Component Value Date   CHOL 136 04/04/2023   LDLCALC 57 04/04/2023   LDLCALC 63 04/03/2022   LDLCALC 88 03/12/2021   HDL 57 04/04/2023   TRIG 136 04/04/2023   TRIG 98 04/03/2022   TRIG 278 (H) 03/12/2021   ALT 13 02/13/2024   ALT 7 04/04/2023   AST 18 02/13/2024   AST 11 04/04/2023      Chemistry      Component Value Date/Time   NA 143 02/13/2024 1228   NA 144 12/20/2015 1012   K 3.9 02/13/2024 1228  CL 103 02/13/2024 1228   CO2 28 02/13/2024 1228   BUN 16 02/13/2024 1228   BUN 17 12/20/2015 1012   CREATININE 0.65 02/13/2024 1228   CREATININE 0.52 04/04/2023 1117      Component Value Date/Time   CALCIUM  9.4 02/13/2024 1228   ALKPHOS 43 02/13/2024 1228   AST 18 02/13/2024 1228   ALT 13 02/13/2024 1228   BILITOT 0.6 02/13/2024 1228   BILITOT 0.2 12/20/2015 1012       The 10-year ASCVD risk score (Arnett DK, et al., 2019)  is: 17.4%  Lab Results  Component Value Date   MICRALBCREAT 20 04/04/2023   MICRALBCREAT 8 04/03/2022   MICRALBCREAT 8 08/10/2020    A/P: Diabetes currently controlled with a most recent A1c of 6.0% on 08/03/24.  Medication adherence appears appropriate. Will maintain current regimen at this time.  -Continued GLP-1 Mounjaro  (tirzepatide )  5 mg weekly -Continued metformin  XR 750 mg 2 tablets daily.  -Extensively discussed pathophysiology of diabetes, recommended lifestyle interventions, dietary effects on blood sugar control.  -Counseled on s/sx of and management of hypoglycemia.   ASCVD risk - primary prevention in patient with diabetes. Last LDL is 32 mg/dL, at goal of <29 mg/dL.  -Continued rosuvastatin  40 mg daily.   Hypertension longstanding currently controlled. Blood pressure goal of <130/80 mmHg. Medication adherence appears appropriate. Single instance of chest pain that resolved with use of a single nitroglycerin  tablet.  -Continued amlodipine  2.5 mg daily. -Continued metoprolol  succinate 50 mg daily. -Continued olmesartan /hydrochlorothiazide  40/25 mg daily. -Counseled for patient to ED if chest pain presents not relieved by nitroglycerin . Patient vocalized understanding  Patient verbalized understanding of treatment plan. Total time patient counseling 30 minutes.  Follow-up:  Pharmacist on 01/31/25 PCP clinic visit on 11/26/24  Peyton CHARLENA Ferries, PharmD, BCACP, CPP Clinical Pharmacist [facility] [phone]    "

## 2024-08-31 ENCOUNTER — Other Ambulatory Visit: Payer: Self-pay | Admitting: Psychiatry

## 2024-08-31 ENCOUNTER — Other Ambulatory Visit: Payer: Self-pay | Admitting: Family Medicine

## 2024-08-31 DIAGNOSIS — F411 Generalized anxiety disorder: Secondary | ICD-10-CM

## 2024-08-31 DIAGNOSIS — E1159 Type 2 diabetes mellitus with other circulatory complications: Secondary | ICD-10-CM

## 2024-08-31 DIAGNOSIS — F3342 Major depressive disorder, recurrent, in full remission: Secondary | ICD-10-CM

## 2024-09-02 NOTE — Telephone Encounter (Signed)
 Requested medication (s) are due for refill today: routing for review  Requested medication (s) are on the active medication list: no  Last refill:  07/01/24  Future visit scheduled: {Yes  Notes to clinic:  historical medication     Requested Prescriptions  Pending Prescriptions Disp Refills   cloNIDine  (CATAPRES ) 0.1 MG tablet [Pharmacy Med Name: cloNIDine  HCl 0.1 MG Oral Tablet] 90 tablet 0    Sig: TAKE 1 TABLET BY MOUTH AT BEDTIME     Cardiovascular:  Alpha-2 Agonists Passed - 09/02/2024  1:46 PM      Passed - Last BP in normal range    BP Readings from Last 1 Encounters:  07/19/24 135/88         Passed - Last Heart Rate in normal range    Pulse Readings from Last 1 Encounters:  05/25/24 68         Passed - Valid encounter within last 6 months    Recent Outpatient Visits           3 months ago Dyslipidemia associated with type 2 diabetes mellitus [facility])   [place] [facility] Glenard Mire, MD   6 months ago Dysuria   [facility] Glenard Mire, MD   6 months ago Abdominal pain, LLQ   [place] [facility] [place], Mire, MD   9 months ago Dyslipidemia associated with type 2 diabetes mellitus [facility])   [facility] Glenard Mire, MD   11 months ago Vaginal burning   [facility] Gareth Mliss [place], [place]

## 2024-09-30 ENCOUNTER — Ambulatory Visit: Admitting: Psychiatry

## 2024-11-26 ENCOUNTER — Ambulatory Visit: Admitting: Family Medicine

## 2025-01-31 ENCOUNTER — Other Ambulatory Visit

## 2025-02-15 ENCOUNTER — Ambulatory Visit: Admitting: Neurology

## 2025-03-01 ENCOUNTER — Ambulatory Visit: Admitting: Neurology
